# Patient Record
Sex: Male | Born: 1985 | Race: White | Hispanic: No | Marital: Married | Smoking: Never smoker
Health system: Southern US, Community
[De-identification: ages and names within clinical notes are randomized; demographics above are authoritative.]

## PROBLEM LIST (undated history)

## (undated) ENCOUNTER — Emergency Department (HOSPITAL_COMMUNITY): Admission: EM | Payer: 59 | Source: Home / Self Care

## (undated) DIAGNOSIS — Z789 Other specified health status: Secondary | ICD-10-CM

## (undated) DIAGNOSIS — J189 Pneumonia, unspecified organism: Secondary | ICD-10-CM

## (undated) DIAGNOSIS — J45909 Unspecified asthma, uncomplicated: Secondary | ICD-10-CM

## (undated) DIAGNOSIS — M549 Dorsalgia, unspecified: Secondary | ICD-10-CM

## (undated) DIAGNOSIS — Z8719 Personal history of other diseases of the digestive system: Secondary | ICD-10-CM

## (undated) DIAGNOSIS — F909 Attention-deficit hyperactivity disorder, unspecified type: Secondary | ICD-10-CM

## (undated) DIAGNOSIS — M1990 Unspecified osteoarthritis, unspecified site: Secondary | ICD-10-CM

## (undated) DIAGNOSIS — K259 Gastric ulcer, unspecified as acute or chronic, without hemorrhage or perforation: Secondary | ICD-10-CM

## (undated) DIAGNOSIS — K209 Esophagitis, unspecified without bleeding: Secondary | ICD-10-CM

## (undated) DIAGNOSIS — K219 Gastro-esophageal reflux disease without esophagitis: Secondary | ICD-10-CM

## (undated) DIAGNOSIS — K5900 Constipation, unspecified: Secondary | ICD-10-CM

## (undated) DIAGNOSIS — Z9989 Dependence on other enabling machines and devices: Secondary | ICD-10-CM

## (undated) DIAGNOSIS — G8929 Other chronic pain: Secondary | ICD-10-CM

## (undated) DIAGNOSIS — Z9889 Other specified postprocedural states: Secondary | ICD-10-CM

## (undated) DIAGNOSIS — M545 Low back pain, unspecified: Secondary | ICD-10-CM

## (undated) DIAGNOSIS — R52 Pain, unspecified: Secondary | ICD-10-CM

## (undated) DIAGNOSIS — Z860100 Personal history of colon polyps, unspecified: Secondary | ICD-10-CM

## (undated) DIAGNOSIS — K648 Other hemorrhoids: Secondary | ICD-10-CM

## (undated) DIAGNOSIS — K644 Residual hemorrhoidal skin tags: Secondary | ICD-10-CM

## (undated) DIAGNOSIS — Z720 Tobacco use: Secondary | ICD-10-CM

## (undated) DIAGNOSIS — R079 Chest pain, unspecified: Secondary | ICD-10-CM

## (undated) DIAGNOSIS — G4730 Sleep apnea, unspecified: Secondary | ICD-10-CM

## (undated) DIAGNOSIS — F329 Major depressive disorder, single episode, unspecified: Secondary | ICD-10-CM

## (undated) DIAGNOSIS — K449 Diaphragmatic hernia without obstruction or gangrene: Secondary | ICD-10-CM

## (undated) DIAGNOSIS — R946 Abnormal results of thyroid function studies: Secondary | ICD-10-CM

## (undated) DIAGNOSIS — E669 Obesity, unspecified: Secondary | ICD-10-CM

## (undated) DIAGNOSIS — L0291 Cutaneous abscess, unspecified: Secondary | ICD-10-CM

## (undated) DIAGNOSIS — K317 Polyp of stomach and duodenum: Secondary | ICD-10-CM

## (undated) DIAGNOSIS — K602 Anal fissure, unspecified: Secondary | ICD-10-CM

## (undated) DIAGNOSIS — F419 Anxiety disorder, unspecified: Secondary | ICD-10-CM

## (undated) DIAGNOSIS — E039 Hypothyroidism, unspecified: Secondary | ICD-10-CM

## (undated) DIAGNOSIS — K760 Fatty (change of) liver, not elsewhere classified: Secondary | ICD-10-CM

## (undated) DIAGNOSIS — E7800 Pure hypercholesterolemia, unspecified: Secondary | ICD-10-CM

## (undated) DIAGNOSIS — Z8614 Personal history of Methicillin resistant Staphylococcus aureus infection: Secondary | ICD-10-CM

## (undated) DIAGNOSIS — F319 Bipolar disorder, unspecified: Secondary | ICD-10-CM

## (undated) DIAGNOSIS — G4733 Obstructive sleep apnea (adult) (pediatric): Secondary | ICD-10-CM

## (undated) DIAGNOSIS — F32A Depression, unspecified: Secondary | ICD-10-CM

## (undated) DIAGNOSIS — F209 Schizophrenia, unspecified: Secondary | ICD-10-CM

## (undated) DIAGNOSIS — K589 Irritable bowel syndrome without diarrhea: Secondary | ICD-10-CM

## (undated) DIAGNOSIS — Z973 Presence of spectacles and contact lenses: Secondary | ICD-10-CM

## (undated) DIAGNOSIS — Z8711 Personal history of peptic ulcer disease: Secondary | ICD-10-CM

## (undated) HISTORY — DX: Chest pain, unspecified: R07.9

## (undated) HISTORY — DX: Anal fissure, unspecified: K60.2

## (undated) HISTORY — PX: VASECTOMY: SHX75

## (undated) HISTORY — DX: Irritable bowel syndrome, unspecified: K58.9

## (undated) HISTORY — DX: Constipation, unspecified: K59.00

## (undated) HISTORY — DX: Fatty (change of) liver, not elsewhere classified: K76.0

## (undated) HISTORY — DX: Dorsalgia, unspecified: M54.9

## (undated) HISTORY — PX: BACK SURGERY: SHX140

## (undated) HISTORY — PX: GASTRIC BYPASS: SHX52

## (undated) HISTORY — PX: FRACTURE SURGERY: SHX138

## (undated) HISTORY — DX: Other specified postprocedural states: Z98.890

## (undated) HISTORY — DX: Tobacco use: Z72.0

## (undated) HISTORY — PX: ANKLE FRACTURE SURGERY: SHX122

## (undated) HISTORY — DX: Gastro-esophageal reflux disease without esophagitis: K21.9

## (undated) HISTORY — DX: Personal history of Methicillin resistant Staphylococcus aureus infection: Z86.14

## (undated) HISTORY — DX: Sleep apnea, unspecified: G47.30

## (undated) HISTORY — DX: Other specified health status: Z78.9

## (undated) HISTORY — PX: OTHER SURGICAL HISTORY: SHX169

## (undated) HISTORY — DX: Personal history of colon polyps, unspecified: Z86.0100

## (undated) HISTORY — DX: Residual hemorrhoidal skin tags: K64.4

## (undated) HISTORY — DX: Polyp of stomach and duodenum: K31.7

## (undated) HISTORY — DX: Unspecified asthma, uncomplicated: J45.909

## (undated) HISTORY — DX: Personal history of colonic polyps: Z86.010

## (undated) HISTORY — DX: Gastric ulcer, unspecified as acute or chronic, without hemorrhage or perforation: K25.9

## (undated) HISTORY — DX: Other hemorrhoids: K64.8

## (undated) HISTORY — DX: Attention-deficit hyperactivity disorder, unspecified type: F90.9

---
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
## 2006-02-12 ENCOUNTER — Ambulatory Visit: Payer: Self-pay | Admitting: Family Medicine

## 2006-02-21 ENCOUNTER — Ambulatory Visit: Payer: Self-pay | Admitting: Family Medicine

## 2006-03-17 ENCOUNTER — Ambulatory Visit: Payer: Self-pay | Admitting: Family Medicine

## 2006-06-11 ENCOUNTER — Emergency Department (HOSPITAL_COMMUNITY): Admission: EM | Admit: 2006-06-11 | Discharge: 2006-06-11 | Payer: Self-pay | Admitting: Emergency Medicine

## 2006-09-17 ENCOUNTER — Ambulatory Visit: Payer: Self-pay | Admitting: Family Medicine

## 2006-10-17 ENCOUNTER — Ambulatory Visit: Payer: Self-pay | Admitting: Family Medicine

## 2006-11-08 ENCOUNTER — Emergency Department (HOSPITAL_COMMUNITY): Admission: EM | Admit: 2006-11-08 | Discharge: 2006-11-08 | Payer: Self-pay | Admitting: Emergency Medicine

## 2006-11-10 ENCOUNTER — Ambulatory Visit: Payer: Self-pay | Admitting: Family Medicine

## 2006-11-14 ENCOUNTER — Ambulatory Visit: Payer: Self-pay | Admitting: Family Medicine

## 2006-12-05 ENCOUNTER — Ambulatory Visit: Payer: Self-pay | Admitting: Family Medicine

## 2007-01-12 ENCOUNTER — Emergency Department (HOSPITAL_COMMUNITY): Admission: EM | Admit: 2007-01-12 | Discharge: 2007-01-13 | Payer: Self-pay | Admitting: Emergency Medicine

## 2007-01-15 ENCOUNTER — Ambulatory Visit: Payer: Self-pay | Admitting: Family Medicine

## 2007-02-17 ENCOUNTER — Ambulatory Visit: Payer: Self-pay | Admitting: Family Medicine

## 2007-06-26 DIAGNOSIS — Z8614 Personal history of Methicillin resistant Staphylococcus aureus infection: Secondary | ICD-10-CM

## 2007-06-26 HISTORY — DX: Personal history of Methicillin resistant Staphylococcus aureus infection: Z86.14

## 2007-07-19 ENCOUNTER — Emergency Department (HOSPITAL_COMMUNITY): Admission: EM | Admit: 2007-07-19 | Discharge: 2007-07-19 | Payer: Self-pay | Admitting: Emergency Medicine

## 2007-08-12 ENCOUNTER — Emergency Department (HOSPITAL_COMMUNITY): Admission: EM | Admit: 2007-08-12 | Discharge: 2007-08-12 | Payer: Self-pay | Admitting: Emergency Medicine

## 2007-08-13 ENCOUNTER — Ambulatory Visit: Payer: Self-pay | Admitting: Urgent Care

## 2007-08-24 ENCOUNTER — Emergency Department (HOSPITAL_COMMUNITY): Admission: EM | Admit: 2007-08-24 | Discharge: 2007-08-25 | Payer: Self-pay | Admitting: Emergency Medicine

## 2007-08-26 ENCOUNTER — Ambulatory Visit (HOSPITAL_COMMUNITY): Admission: RE | Admit: 2007-08-26 | Discharge: 2007-08-26 | Payer: Self-pay | Admitting: Internal Medicine

## 2007-08-26 ENCOUNTER — Ambulatory Visit: Payer: Self-pay | Admitting: Internal Medicine

## 2007-08-26 HISTORY — PX: ESOPHAGOGASTRODUODENOSCOPY: SHX1529

## 2007-08-26 HISTORY — PX: OTHER SURGICAL HISTORY: SHX169

## 2007-09-01 ENCOUNTER — Emergency Department (HOSPITAL_COMMUNITY): Admission: EM | Admit: 2007-09-01 | Discharge: 2007-09-01 | Payer: Self-pay | Admitting: Family Medicine

## 2007-10-05 ENCOUNTER — Emergency Department (HOSPITAL_COMMUNITY): Admission: EM | Admit: 2007-10-05 | Discharge: 2007-10-05 | Payer: Self-pay | Admitting: Family Medicine

## 2007-10-10 ENCOUNTER — Emergency Department (HOSPITAL_COMMUNITY): Admission: EM | Admit: 2007-10-10 | Discharge: 2007-10-10 | Payer: Self-pay | Admitting: Family Medicine

## 2007-11-30 ENCOUNTER — Emergency Department (HOSPITAL_COMMUNITY): Admission: EM | Admit: 2007-11-30 | Discharge: 2007-11-30 | Payer: Self-pay | Admitting: Family Medicine

## 2008-01-27 ENCOUNTER — Emergency Department (HOSPITAL_COMMUNITY): Admission: EM | Admit: 2008-01-27 | Discharge: 2008-01-27 | Payer: Self-pay | Admitting: Emergency Medicine

## 2009-01-05 ENCOUNTER — Emergency Department (HOSPITAL_COMMUNITY): Admission: EM | Admit: 2009-01-05 | Discharge: 2009-01-06 | Payer: Self-pay | Admitting: Emergency Medicine

## 2009-01-06 ENCOUNTER — Emergency Department (HOSPITAL_COMMUNITY): Admission: EM | Admit: 2009-01-06 | Discharge: 2009-01-06 | Payer: Self-pay | Admitting: Emergency Medicine

## 2009-01-09 ENCOUNTER — Ambulatory Visit (HOSPITAL_COMMUNITY): Admission: RE | Admit: 2009-01-09 | Discharge: 2009-01-09 | Payer: Self-pay | Admitting: Internal Medicine

## 2009-01-09 ENCOUNTER — Ambulatory Visit: Payer: Self-pay | Admitting: Internal Medicine

## 2009-01-09 HISTORY — PX: ESOPHAGOGASTRODUODENOSCOPY: SHX1529

## 2009-01-30 ENCOUNTER — Ambulatory Visit: Payer: Self-pay | Admitting: Internal Medicine

## 2009-01-31 ENCOUNTER — Telehealth (INDEPENDENT_AMBULATORY_CARE_PROVIDER_SITE_OTHER): Payer: Self-pay

## 2009-02-23 ENCOUNTER — Emergency Department (HOSPITAL_COMMUNITY): Admission: EM | Admit: 2009-02-23 | Discharge: 2009-02-23 | Payer: Self-pay | Admitting: Emergency Medicine

## 2009-02-27 ENCOUNTER — Emergency Department (HOSPITAL_COMMUNITY): Admission: EM | Admit: 2009-02-27 | Discharge: 2009-02-27 | Payer: Self-pay | Admitting: Emergency Medicine

## 2009-02-28 ENCOUNTER — Emergency Department (HOSPITAL_COMMUNITY): Admission: EM | Admit: 2009-02-28 | Discharge: 2009-02-28 | Payer: Self-pay | Admitting: Emergency Medicine

## 2009-03-05 ENCOUNTER — Emergency Department (HOSPITAL_COMMUNITY): Admission: EM | Admit: 2009-03-05 | Discharge: 2009-03-05 | Payer: Self-pay | Admitting: Emergency Medicine

## 2009-03-06 DIAGNOSIS — K219 Gastro-esophageal reflux disease without esophagitis: Secondary | ICD-10-CM | POA: Insufficient documentation

## 2009-03-06 DIAGNOSIS — F909 Attention-deficit hyperactivity disorder, unspecified type: Secondary | ICD-10-CM | POA: Insufficient documentation

## 2009-03-06 DIAGNOSIS — F17200 Nicotine dependence, unspecified, uncomplicated: Secondary | ICD-10-CM | POA: Insufficient documentation

## 2009-03-06 DIAGNOSIS — R197 Diarrhea, unspecified: Secondary | ICD-10-CM | POA: Insufficient documentation

## 2009-03-06 DIAGNOSIS — F319 Bipolar disorder, unspecified: Secondary | ICD-10-CM | POA: Insufficient documentation

## 2009-03-06 DIAGNOSIS — R112 Nausea with vomiting, unspecified: Secondary | ICD-10-CM | POA: Insufficient documentation

## 2009-03-07 ENCOUNTER — Ambulatory Visit: Payer: Self-pay | Admitting: Internal Medicine

## 2009-03-07 ENCOUNTER — Emergency Department (HOSPITAL_COMMUNITY): Admission: EM | Admit: 2009-03-07 | Discharge: 2009-03-08 | Payer: Self-pay | Admitting: Emergency Medicine

## 2009-03-07 DIAGNOSIS — R1030 Lower abdominal pain, unspecified: Secondary | ICD-10-CM

## 2009-03-07 DIAGNOSIS — R109 Unspecified abdominal pain: Secondary | ICD-10-CM

## 2009-03-07 HISTORY — DX: Lower abdominal pain, unspecified: R10.30

## 2009-03-08 ENCOUNTER — Encounter: Payer: Self-pay | Admitting: Gastroenterology

## 2009-03-08 LAB — CONVERTED CEMR LAB: IgA: 112 mg/dL (ref 68–378)

## 2009-04-04 ENCOUNTER — Emergency Department (HOSPITAL_COMMUNITY): Admission: EM | Admit: 2009-04-04 | Discharge: 2009-04-04 | Payer: Self-pay | Admitting: Emergency Medicine

## 2009-04-05 ENCOUNTER — Encounter (INDEPENDENT_AMBULATORY_CARE_PROVIDER_SITE_OTHER): Payer: Self-pay | Admitting: *Deleted

## 2009-04-06 ENCOUNTER — Ambulatory Visit: Payer: Self-pay | Admitting: Internal Medicine

## 2009-04-06 DIAGNOSIS — R195 Other fecal abnormalities: Secondary | ICD-10-CM | POA: Insufficient documentation

## 2009-04-06 DIAGNOSIS — K921 Melena: Secondary | ICD-10-CM

## 2009-04-06 HISTORY — DX: Melena: K92.1

## 2009-04-07 ENCOUNTER — Ambulatory Visit (HOSPITAL_COMMUNITY): Admission: RE | Admit: 2009-04-07 | Discharge: 2009-04-07 | Payer: Self-pay | Admitting: Internal Medicine

## 2009-04-07 ENCOUNTER — Ambulatory Visit: Payer: Self-pay | Admitting: Internal Medicine

## 2009-04-07 HISTORY — PX: ESOPHAGOGASTRODUODENOSCOPY: SHX1529

## 2009-04-11 ENCOUNTER — Ambulatory Visit (HOSPITAL_COMMUNITY): Admission: RE | Admit: 2009-04-11 | Discharge: 2009-04-11 | Payer: Self-pay | Admitting: Internal Medicine

## 2009-04-11 HISTORY — PX: OTHER SURGICAL HISTORY: SHX169

## 2009-04-12 ENCOUNTER — Ambulatory Visit: Payer: Self-pay | Admitting: Internal Medicine

## 2009-04-19 ENCOUNTER — Encounter: Payer: Self-pay | Admitting: Internal Medicine

## 2009-04-27 ENCOUNTER — Encounter: Payer: Self-pay | Admitting: Gastroenterology

## 2009-05-22 ENCOUNTER — Encounter (INDEPENDENT_AMBULATORY_CARE_PROVIDER_SITE_OTHER): Payer: Self-pay

## 2009-05-23 ENCOUNTER — Telehealth (INDEPENDENT_AMBULATORY_CARE_PROVIDER_SITE_OTHER): Payer: Self-pay | Admitting: *Deleted

## 2009-05-30 ENCOUNTER — Encounter: Payer: Self-pay | Admitting: Internal Medicine

## 2009-06-08 ENCOUNTER — Encounter: Payer: Self-pay | Admitting: Internal Medicine

## 2009-06-09 ENCOUNTER — Encounter: Payer: Self-pay | Admitting: Internal Medicine

## 2009-06-09 ENCOUNTER — Telehealth (INDEPENDENT_AMBULATORY_CARE_PROVIDER_SITE_OTHER): Payer: Self-pay

## 2009-07-10 ENCOUNTER — Emergency Department (HOSPITAL_COMMUNITY): Admission: EM | Admit: 2009-07-10 | Discharge: 2009-07-10 | Payer: Self-pay | Admitting: Emergency Medicine

## 2009-09-07 ENCOUNTER — Telehealth (INDEPENDENT_AMBULATORY_CARE_PROVIDER_SITE_OTHER): Payer: Self-pay

## 2009-09-11 ENCOUNTER — Encounter: Payer: Self-pay | Admitting: Internal Medicine

## 2009-09-11 DIAGNOSIS — D649 Anemia, unspecified: Secondary | ICD-10-CM | POA: Insufficient documentation

## 2009-09-13 ENCOUNTER — Encounter: Payer: Self-pay | Admitting: Internal Medicine

## 2009-09-15 ENCOUNTER — Ambulatory Visit: Payer: Self-pay | Admitting: Gastroenterology

## 2009-09-19 ENCOUNTER — Emergency Department (HOSPITAL_COMMUNITY): Admission: EM | Admit: 2009-09-19 | Discharge: 2009-09-19 | Payer: Self-pay | Admitting: Emergency Medicine

## 2009-09-20 LAB — CONVERTED CEMR LAB
Hemoglobin: 13.6 g/dL (ref 13.0–17.0)
Lymphs Abs: 2.9 10*3/uL (ref 0.7–4.0)
Monocytes Absolute: 0.5 10*3/uL (ref 0.1–1.0)
Monocytes Relative: 8 % (ref 3–12)
Neutro Abs: 3.4 10*3/uL (ref 1.7–7.7)
Neutrophils Relative %: 48 % (ref 43–77)
RBC: 5.42 M/uL (ref 4.22–5.81)
WBC: 7.1 10*3/uL (ref 4.0–10.5)

## 2009-10-03 ENCOUNTER — Ambulatory Visit: Payer: Self-pay | Admitting: Internal Medicine

## 2009-10-04 ENCOUNTER — Telehealth (INDEPENDENT_AMBULATORY_CARE_PROVIDER_SITE_OTHER): Payer: Self-pay

## 2009-10-05 ENCOUNTER — Encounter: Payer: Self-pay | Admitting: Internal Medicine

## 2009-10-10 ENCOUNTER — Ambulatory Visit (HOSPITAL_COMMUNITY): Admission: RE | Admit: 2009-10-10 | Discharge: 2009-10-10 | Payer: Self-pay | Admitting: Internal Medicine

## 2009-10-10 ENCOUNTER — Ambulatory Visit: Payer: Self-pay | Admitting: Internal Medicine

## 2009-10-10 HISTORY — PX: COLONOSCOPY: SHX174

## 2009-11-03 ENCOUNTER — Telehealth (INDEPENDENT_AMBULATORY_CARE_PROVIDER_SITE_OTHER): Payer: Self-pay

## 2009-11-17 ENCOUNTER — Emergency Department (HOSPITAL_COMMUNITY): Admission: EM | Admit: 2009-11-17 | Discharge: 2009-11-17 | Payer: Self-pay | Admitting: Emergency Medicine

## 2009-12-09 ENCOUNTER — Emergency Department (HOSPITAL_COMMUNITY): Admission: EM | Admit: 2009-12-09 | Discharge: 2009-12-09 | Payer: Self-pay | Admitting: Emergency Medicine

## 2010-01-01 ENCOUNTER — Encounter: Admission: RE | Admit: 2010-01-01 | Discharge: 2010-04-01 | Payer: Self-pay | Admitting: Orthopedic Surgery

## 2010-02-22 ENCOUNTER — Emergency Department (HOSPITAL_COMMUNITY): Admission: EM | Admit: 2010-02-22 | Discharge: 2010-02-22 | Payer: Self-pay | Admitting: Emergency Medicine

## 2010-02-24 ENCOUNTER — Ambulatory Visit: Payer: Self-pay | Admitting: Diagnostic Radiology

## 2010-02-24 ENCOUNTER — Emergency Department (HOSPITAL_BASED_OUTPATIENT_CLINIC_OR_DEPARTMENT_OTHER): Admission: EM | Admit: 2010-02-24 | Discharge: 2010-02-24 | Payer: Self-pay | Admitting: Emergency Medicine

## 2010-02-26 ENCOUNTER — Telehealth (INDEPENDENT_AMBULATORY_CARE_PROVIDER_SITE_OTHER): Payer: Self-pay

## 2010-03-01 ENCOUNTER — Encounter: Payer: Self-pay | Admitting: Emergency Medicine

## 2010-03-01 ENCOUNTER — Ambulatory Visit: Payer: Self-pay | Admitting: Diagnostic Radiology

## 2010-03-01 ENCOUNTER — Inpatient Hospital Stay (HOSPITAL_COMMUNITY): Admission: EM | Admit: 2010-03-01 | Discharge: 2010-03-04 | Payer: Self-pay | Admitting: Internal Medicine

## 2010-05-07 ENCOUNTER — Telehealth (INDEPENDENT_AMBULATORY_CARE_PROVIDER_SITE_OTHER): Payer: Self-pay

## 2010-05-24 ENCOUNTER — Emergency Department (HOSPITAL_COMMUNITY): Admission: EM | Admit: 2010-05-24 | Discharge: 2010-05-25 | Payer: Self-pay | Admitting: Emergency Medicine

## 2010-06-29 ENCOUNTER — Emergency Department (HOSPITAL_COMMUNITY): Admission: EM | Admit: 2010-06-29 | Discharge: 2010-06-29 | Payer: Self-pay | Admitting: Emergency Medicine

## 2010-07-15 ENCOUNTER — Emergency Department (HOSPITAL_COMMUNITY): Admission: EM | Admit: 2010-07-15 | Discharge: 2010-07-16 | Payer: Self-pay | Admitting: Emergency Medicine

## 2010-11-08 ENCOUNTER — Emergency Department (HOSPITAL_BASED_OUTPATIENT_CLINIC_OR_DEPARTMENT_OTHER)
Admission: EM | Admit: 2010-11-08 | Discharge: 2010-11-08 | Payer: Self-pay | Source: Home / Self Care | Admitting: Emergency Medicine

## 2010-12-25 NOTE — Progress Notes (Signed)
Summary: phone message/ requests samples of Nexium  Phone Note Call from Patient   Caller: Patient Summary of Call: Pt called and said his Nexium would cost him $265.00. He can't afford. He lost Medicaid. Requests samples of Nexium. #20 at front for pick-up and pt is aware.  Initial call taken by: Cloria Spring LPN,  May 07, 2010 2:27 PM

## 2010-12-25 NOTE — Progress Notes (Signed)
Summary: blood in stool  Phone Note Call from Patient   Caller: Patient Summary of Call: Pt called and said he had two BM's yesterday and two BM's already this AM with dark red blood in the stool, and  bright red blood on tissue. Please advise! Initial call taken by: Cloria Spring LPN,  February 26, 2010 11:01 AM     Appended Document: blood in stool recommend appt with rmr in urgent spot for this week  Appended Document: blood in stool Pt informed.

## 2011-01-21 ENCOUNTER — Emergency Department (INDEPENDENT_AMBULATORY_CARE_PROVIDER_SITE_OTHER): Payer: Medicaid Other

## 2011-01-21 ENCOUNTER — Emergency Department (HOSPITAL_BASED_OUTPATIENT_CLINIC_OR_DEPARTMENT_OTHER)
Admission: EM | Admit: 2011-01-21 | Discharge: 2011-01-21 | Disposition: A | Discharge status: Home / Self Care | Payer: Medicaid Other | Attending: Emergency Medicine | Admitting: Emergency Medicine

## 2011-01-21 DIAGNOSIS — N509 Disorder of male genital organs, unspecified: Secondary | ICD-10-CM

## 2011-01-21 DIAGNOSIS — F319 Bipolar disorder, unspecified: Secondary | ICD-10-CM | POA: Insufficient documentation

## 2011-01-21 DIAGNOSIS — N433 Hydrocele, unspecified: Secondary | ICD-10-CM

## 2011-01-21 DIAGNOSIS — K219 Gastro-esophageal reflux disease without esophagitis: Secondary | ICD-10-CM | POA: Insufficient documentation

## 2011-01-21 DIAGNOSIS — Z79899 Other long term (current) drug therapy: Secondary | ICD-10-CM | POA: Insufficient documentation

## 2011-01-21 LAB — URINALYSIS, ROUTINE W REFLEX MICROSCOPIC
Ketones, ur: NEGATIVE mg/dL
Nitrite: NEGATIVE
Specific Gravity, Urine: 1.022 (ref 1.005–1.030)
pH: 7.5 (ref 5.0–8.0)

## 2011-02-10 LAB — URINALYSIS, ROUTINE W REFLEX MICROSCOPIC
Bilirubin Urine: NEGATIVE
Nitrite: NEGATIVE
Specific Gravity, Urine: 1.025 (ref 1.005–1.030)
Urobilinogen, UA: 0.2 mg/dL (ref 0.0–1.0)

## 2011-02-10 LAB — CBC
Hemoglobin: 12.7 g/dL — ABNORMAL LOW (ref 13.0–17.0)
MCH: 25.6 pg — ABNORMAL LOW (ref 26.0–34.0)
RBC: 4.96 MIL/uL (ref 4.22–5.81)

## 2011-02-10 LAB — RAPID URINE DRUG SCREEN, HOSP PERFORMED
Opiates: NOT DETECTED
Tetrahydrocannabinol: NOT DETECTED

## 2011-02-10 LAB — BASIC METABOLIC PANEL
CO2: 25 mEq/L (ref 19–32)
Calcium: 9.3 mg/dL (ref 8.4–10.5)
GFR calc Af Amer: >60 mL/min (ref >60)
GFR calc non Af Amer: >60 mL/min (ref >60)
Sodium: 138 mEq/L (ref 135–145)

## 2011-02-10 LAB — DIFFERENTIAL
Lymphs Abs: 3.8 10*3/uL (ref 0.7–4.0)
Monocytes Relative: 6 % (ref 3–12)
Neutro Abs: 6 10*3/uL (ref 1.7–7.7)
Neutrophils Relative %: 56 % (ref 43–77)

## 2011-02-13 LAB — URINALYSIS, ROUTINE W REFLEX MICROSCOPIC
Bilirubin Urine: NEGATIVE
Glucose, UA: NEGATIVE mg/dL
Hgb urine dipstick: NEGATIVE
Hgb urine dipstick: NEGATIVE
Nitrite: NEGATIVE
Nitrite: NEGATIVE
Protein, ur: NEGATIVE mg/dL
Protein, ur: NEGATIVE mg/dL
Specific Gravity, Urine: 1.016 (ref 1.005–1.030)
Specific Gravity, Urine: 1.027 (ref 1.005–1.030)
Urobilinogen, UA: 0.2 mg/dL (ref 0.0–1.0)
Urobilinogen, UA: 0.2 mg/dL (ref 0.0–1.0)
pH: 5 (ref 5.0–8.0)

## 2011-02-13 LAB — CBC
HCT: 35.7 % — ABNORMAL LOW (ref 39.0–52.0)
HCT: 36.2 % — ABNORMAL LOW (ref 39.0–52.0)
HCT: 37.2 % — ABNORMAL LOW (ref 39.0–52.0)
HCT: 39.5 % (ref 39.0–52.0)
Hemoglobin: 11.3 g/dL — ABNORMAL LOW (ref 13.0–17.0)
Hemoglobin: 11.6 g/dL — ABNORMAL LOW (ref 13.0–17.0)
Hemoglobin: 11.9 g/dL — ABNORMAL LOW (ref 13.0–17.0)
Hemoglobin: 13 g/dL (ref 13.0–17.0)
MCHC: 32.9 g/dL (ref 30.0–36.0)
MCHC: 33.2 g/dL (ref 30.0–36.0)
MCHC: 33.4 g/dL (ref 30.0–36.0)
MCV: 79.2 fL (ref 78.0–100.0)
MCV: 80.1 fL (ref 78.0–100.0)
Platelets: 256 10*3/uL (ref 150–400)
Platelets: 263 10*3/uL (ref 150–400)
Platelets: 271 10*3/uL (ref 150–400)
Platelets: 326 10*3/uL (ref 150–400)
RBC: 4.26 MIL/uL (ref 4.22–5.81)
RBC: 4.41 MIL/uL (ref 4.22–5.81)
RBC: 4.53 MIL/uL (ref 4.22–5.81)
RBC: 4.99 MIL/uL (ref 4.22–5.81)
RDW: 15.7 % — ABNORMAL HIGH (ref 11.5–15.5)
RDW: 16 % — ABNORMAL HIGH (ref 11.5–15.5)
RDW: 16 % — ABNORMAL HIGH (ref 11.5–15.5)
RDW: 16.3 % — ABNORMAL HIGH (ref 11.5–15.5)
WBC: 10.4 10*3/uL (ref 4.0–10.5)
WBC: 8.8 10*3/uL (ref 4.0–10.5)
WBC: 8.9 10*3/uL (ref 4.0–10.5)
WBC: 9 10*3/uL (ref 4.0–10.5)

## 2011-02-13 LAB — BASIC METABOLIC PANEL
CO2: 32 mEq/L (ref 19–32)
Calcium: 9 mg/dL (ref 8.4–10.5)
Creatinine, Ser: 0.9 mg/dL (ref 0.4–1.5)
GFR calc non Af Amer: >60 mL/min (ref >60)
Glucose, Bld: 102 mg/dL — ABNORMAL HIGH (ref 70–99)
Sodium: 142 mEq/L (ref 135–145)

## 2011-02-13 LAB — COMPREHENSIVE METABOLIC PANEL
AST: 33 U/L (ref 0–37)
Albumin: 4.1 g/dL (ref 3.5–5.2)
Alkaline Phosphatase: 59 U/L (ref 39–117)
Alkaline Phosphatase: 64 U/L (ref 39–117)
BUN: 11 mg/dL (ref 6–23)
BUN: 8 mg/dL (ref 6–23)
CO2: 26 mEq/L (ref 19–32)
CO2: 30 mEq/L (ref 19–32)
Chloride: 105 mEq/L (ref 96–112)
Chloride: 105 mEq/L (ref 96–112)
Creatinine, Ser: 0.98 mg/dL (ref 0.4–1.5)
Creatinine, Ser: 1 mg/dL (ref 0.4–1.5)
GFR calc Af Amer: >60 mL/min (ref >60)
GFR calc non Af Amer: >60 mL/min (ref >60)
GFR calc non Af Amer: >60 mL/min (ref >60)
Potassium: 3.9 mEq/L (ref 3.5–5.1)
Potassium: 4.3 mEq/L (ref 3.5–5.1)
Total Bilirubin: 0.4 mg/dL (ref 0.3–1.2)
Total Bilirubin: 0.7 mg/dL (ref 0.3–1.2)

## 2011-02-13 LAB — DIFFERENTIAL
Basophils Absolute: 0.1 10*3/uL (ref 0.0–0.1)
Basophils Absolute: 0.2 10*3/uL — ABNORMAL HIGH (ref 0.0–0.1)
Basophils Relative: 1 % (ref 0–1)
Basophils Relative: 2 % — ABNORMAL HIGH (ref 0–1)
Eosinophils Relative: 1 % (ref 0–5)
Eosinophils Relative: 3 % (ref 0–5)
Lymphocytes Relative: 43 % (ref 12–46)
Monocytes Absolute: 0.8 10*3/uL (ref 0.1–1.0)
Neutro Abs: 5.6 10*3/uL (ref 1.7–7.7)

## 2011-02-13 LAB — HEMOCCULT GUIAC POC 1CARD (OFFICE)
Fecal Occult Bld: NEGATIVE
Fecal Occult Bld: POSITIVE

## 2011-02-13 LAB — T3, FREE: T3, Free: 2.8 pg/mL (ref 2.3–4.2)

## 2011-02-13 LAB — URINE CULTURE
Culture: NO GROWTH
Special Requests: NEGATIVE

## 2011-02-13 LAB — CULTURE, BLOOD (ROUTINE X 2): Culture: NO GROWTH

## 2011-02-13 LAB — GC/CHLAMYDIA PROBE AMP, URINE
Chlamydia, Swab/Urine, PCR: NEGATIVE
GC Probe Amp, Urine: NEGATIVE

## 2011-02-13 LAB — T4, FREE: Free T4: 0.78 ng/dL — ABNORMAL LOW (ref 0.80–1.80)

## 2011-02-13 LAB — MAGNESIUM: Magnesium: 2.1 mg/dL (ref 1.5–2.5)

## 2011-02-13 LAB — LACTIC ACID, PLASMA: Lactic Acid, Venous: 1.4 mmol/L (ref 0.5–2.2)

## 2011-02-13 LAB — TSH: TSH: 8.799 u[IU]/mL — ABNORMAL HIGH (ref 0.350–4.500)

## 2011-02-13 LAB — LIPASE, BLOOD: Lipase: 91 U/L (ref 23–300)

## 2011-02-13 LAB — PROTIME-INR: INR: 1.02 (ref 0.00–1.49)

## 2011-02-13 LAB — APTT: aPTT: 27 seconds (ref 24–37)

## 2011-02-13 LAB — SEDIMENTATION RATE: Sed Rate: 1 mm/hr (ref 0–16)

## 2011-02-13 LAB — RPR: RPR Ser Ql: NONREACTIVE

## 2011-02-15 ENCOUNTER — Emergency Department (HOSPITAL_BASED_OUTPATIENT_CLINIC_OR_DEPARTMENT_OTHER)
Admission: EM | Admit: 2011-02-15 | Discharge: 2011-02-15 | Disposition: A | Discharge status: Home / Self Care | Payer: Medicaid Other | Attending: Emergency Medicine | Admitting: Emergency Medicine

## 2011-02-15 DIAGNOSIS — R112 Nausea with vomiting, unspecified: Secondary | ICD-10-CM | POA: Insufficient documentation

## 2011-02-15 DIAGNOSIS — K922 Gastrointestinal hemorrhage, unspecified: Secondary | ICD-10-CM | POA: Insufficient documentation

## 2011-02-15 DIAGNOSIS — F319 Bipolar disorder, unspecified: Secondary | ICD-10-CM | POA: Insufficient documentation

## 2011-02-15 DIAGNOSIS — R197 Diarrhea, unspecified: Secondary | ICD-10-CM | POA: Insufficient documentation

## 2011-02-15 DIAGNOSIS — F988 Other specified behavioral and emotional disorders with onset usually occurring in childhood and adolescence: Secondary | ICD-10-CM | POA: Insufficient documentation

## 2011-02-15 DIAGNOSIS — K219 Gastro-esophageal reflux disease without esophagitis: Secondary | ICD-10-CM | POA: Insufficient documentation

## 2011-02-15 LAB — CBC
HCT: 43.4 % (ref 39.0–52.0)
MCHC: 32.9 g/dL (ref 30.0–36.0)
Platelets: 316 10*3/uL (ref 150–400)
RDW: 16.9 % — ABNORMAL HIGH (ref 11.5–15.5)
WBC: 7.5 10*3/uL (ref 4.0–10.5)

## 2011-02-15 LAB — DIFFERENTIAL
Basophils Absolute: 0 10*3/uL (ref 0.0–0.1)
Basophils Relative: 0 % (ref 0–1)
Eosinophils Absolute: 0.1 10*3/uL (ref 0.0–0.7)
Eosinophils Relative: 1 % (ref 0–5)
Lymphocytes Relative: 31 % (ref 12–46)
Monocytes Absolute: 0.6 10*3/uL (ref 0.1–1.0)

## 2011-02-15 LAB — HEMOCCULT GUIAC POC 1CARD (OFFICE): Fecal Occult Bld: NEGATIVE

## 2011-02-15 LAB — URINALYSIS, ROUTINE W REFLEX MICROSCOPIC
Hgb urine dipstick: NEGATIVE
Nitrite: NEGATIVE
Protein, ur: 100 mg/dL — AB
Urobilinogen, UA: 1 mg/dL (ref 0.0–1.0)

## 2011-02-15 LAB — COMPREHENSIVE METABOLIC PANEL
ALT: 50 U/L (ref 0–53)
Albumin: 4.6 g/dL (ref 3.5–5.2)
Alkaline Phosphatase: 64 U/L (ref 39–117)
Chloride: 104 mEq/L (ref 96–112)
Potassium: 4.2 mEq/L (ref 3.5–5.1)
Sodium: 144 mEq/L (ref 135–145)
Total Protein: 7.8 g/dL (ref 6.0–8.3)

## 2011-02-15 LAB — URINE MICROSCOPIC-ADD ON

## 2011-03-05 LAB — CBC
MCHC: 34 g/dL (ref 30.0–36.0)
MCV: 79.4 fL (ref 78.0–100.0)
Platelets: 248 10*3/uL (ref 150–400)
Platelets: 283 10*3/uL (ref 150–400)
RDW: 14.9 % (ref 11.5–15.5)
WBC: 7.7 10*3/uL (ref 4.0–10.5)

## 2011-03-05 LAB — BASIC METABOLIC PANEL
BUN: 11 mg/dL (ref 6–23)
CO2: 29 mEq/L (ref 19–32)
Calcium: 9.2 mg/dL (ref 8.4–10.5)
Creatinine, Ser: 0.93 mg/dL (ref 0.4–1.5)
Glucose, Bld: 108 mg/dL — ABNORMAL HIGH (ref 70–99)

## 2011-03-05 LAB — DIFFERENTIAL
Basophils Absolute: 0 10*3/uL (ref 0.0–0.1)
Basophils Relative: 1 % (ref 0–1)
Monocytes Absolute: 0.5 10*3/uL (ref 0.1–1.0)
Neutro Abs: 5.1 10*3/uL (ref 1.7–7.7)
Neutrophils Relative %: 56 % (ref 43–77)

## 2011-03-05 LAB — SAMPLE TO BLOOD BANK

## 2011-03-06 LAB — DIFFERENTIAL
Basophils Absolute: 0.1 10*3/uL (ref 0.0–0.1)
Basophils Relative: 1 % (ref 0–1)
Basophils Relative: 1 % (ref 0–1)
Eosinophils Relative: 2 % (ref 0–5)
Lymphs Abs: 2.6 10*3/uL (ref 0.7–4.0)
Monocytes Absolute: 0.7 10*3/uL (ref 0.1–1.0)
Monocytes Absolute: 0.7 10*3/uL (ref 0.1–1.0)
Monocytes Relative: 8 % (ref 3–12)
Neutro Abs: 4.7 10*3/uL (ref 1.7–7.7)

## 2011-03-06 LAB — BASIC METABOLIC PANEL
CO2: 28 mEq/L (ref 19–32)
Calcium: 9.5 mg/dL (ref 8.4–10.5)
Chloride: 105 mEq/L (ref 96–112)
Glucose, Bld: 88 mg/dL (ref 70–99)
Potassium: 4.1 mEq/L (ref 3.5–5.1)
Sodium: 139 mEq/L (ref 135–145)

## 2011-03-06 LAB — CBC
HCT: 40.8 % (ref 39.0–52.0)
Hemoglobin: 13.8 g/dL (ref 13.0–17.0)
MCHC: 33.5 g/dL (ref 30.0–36.0)
MCHC: 33.8 g/dL (ref 30.0–36.0)
MCV: 80.3 fL (ref 78.0–100.0)
Platelets: 239 10*3/uL (ref 150–400)
RDW: 14.8 % (ref 11.5–15.5)
RDW: 15 % (ref 11.5–15.5)

## 2011-03-06 LAB — COMPREHENSIVE METABOLIC PANEL
ALT: 25 U/L (ref 0–53)
Albumin: 3.7 g/dL (ref 3.5–5.2)
Alkaline Phosphatase: 61 U/L (ref 39–117)
BUN: 8 mg/dL (ref 6–23)
Calcium: 9.3 mg/dL (ref 8.4–10.5)
Potassium: 4.2 mEq/L (ref 3.5–5.1)
Sodium: 139 mEq/L (ref 135–145)
Total Protein: 6.3 g/dL (ref 6.0–8.3)

## 2011-03-06 LAB — URINALYSIS, ROUTINE W REFLEX MICROSCOPIC
Bilirubin Urine: NEGATIVE
Hgb urine dipstick: NEGATIVE
Specific Gravity, Urine: >1.03 — ABNORMAL HIGH (ref 1.005–1.030)
Urobilinogen, UA: 0.2 mg/dL (ref 0.0–1.0)

## 2011-03-06 LAB — STREP A DNA PROBE: Group A Strep Probe: NEGATIVE

## 2011-03-06 LAB — RAPID STREP SCREEN (MED CTR MEBANE ONLY): Streptococcus, Group A Screen (Direct): NEGATIVE

## 2011-03-12 LAB — POCT I-STAT, CHEM 8
Calcium, Ion: 1.19 mmol/L (ref 1.12–1.32)
Chloride: 102 mEq/L (ref 96–112)
Glucose, Bld: 93 mg/dL (ref 70–99)
HCT: 48 % (ref 39.0–52.0)
Hemoglobin: 16.3 g/dL (ref 13.0–17.0)
TCO2: 28 mmol/L (ref 0–100)

## 2011-03-12 LAB — DIFFERENTIAL
Basophils Absolute: 0 10*3/uL (ref 0.0–0.1)
Basophils Relative: 0 % (ref 0–1)
Basophils Relative: 0 % (ref 0–1)
Eosinophils Absolute: 0.1 10*3/uL (ref 0.0–0.7)
Eosinophils Absolute: 0.1 10*3/uL (ref 0.0–0.7)
Eosinophils Relative: 1 % (ref 0–5)
Eosinophils Relative: 2 % (ref 0–5)
Monocytes Absolute: 0.7 10*3/uL (ref 0.1–1.0)
Monocytes Relative: 6 % (ref 3–12)
Monocytes Relative: 7 % (ref 3–12)
Neutro Abs: 6 10*3/uL (ref 1.7–7.7)
Neutrophils Relative %: 55 % (ref 43–77)

## 2011-03-12 LAB — CBC
Hemoglobin: 14.7 g/dL (ref 13.0–17.0)
Hemoglobin: 13.8 g/dL (ref 13.0–17.0)
MCHC: 34.2 g/dL (ref 30.0–36.0)
MCHC: 33.1 g/dL (ref 30.0–36.0)
MCV: 80.8 fL (ref 78.0–100.0)
Platelets: 267 10*3/uL (ref 150–400)
RBC: 5.31 MIL/uL (ref 4.22–5.81)
RDW: 14.4 % (ref 11.5–15.5)
RDW: 14.6 % (ref 11.5–15.5)

## 2011-03-12 LAB — COMPREHENSIVE METABOLIC PANEL
ALT: 25 U/L (ref 0–53)
Albumin: 3.9 g/dL (ref 3.5–5.2)
Alkaline Phosphatase: 66 U/L (ref 39–117)
BUN: 10 mg/dL (ref 6–23)
Calcium: 9.2 mg/dL (ref 8.4–10.5)
Glucose, Bld: 95 mg/dL (ref 70–99)
Potassium: 3.8 mEq/L (ref 3.5–5.1)
Sodium: 140 mEq/L (ref 135–145)
Total Protein: 6.3 g/dL (ref 6.0–8.3)

## 2011-03-12 LAB — URINALYSIS, ROUTINE W REFLEX MICROSCOPIC
Bilirubin Urine: NEGATIVE
Ketones, ur: NEGATIVE mg/dL
Nitrite: NEGATIVE
Protein, ur: NEGATIVE mg/dL

## 2011-03-12 LAB — POCT I-STAT 4, (NA,K, GLUC, HGB,HCT): Potassium: 4.3 mEq/L (ref 3.5–5.1)

## 2011-04-02 ENCOUNTER — Emergency Department (HOSPITAL_BASED_OUTPATIENT_CLINIC_OR_DEPARTMENT_OTHER)
Admission: EM | Admit: 2011-04-02 | Discharge: 2011-04-02 | Disposition: A | Discharge status: Home / Self Care | Payer: Medicaid Other | Attending: Emergency Medicine | Admitting: Emergency Medicine

## 2011-04-02 DIAGNOSIS — K089 Disorder of teeth and supporting structures, unspecified: Secondary | ICD-10-CM | POA: Insufficient documentation

## 2011-04-02 DIAGNOSIS — F319 Bipolar disorder, unspecified: Secondary | ICD-10-CM | POA: Insufficient documentation

## 2011-04-02 DIAGNOSIS — K219 Gastro-esophageal reflux disease without esophagitis: Secondary | ICD-10-CM | POA: Insufficient documentation

## 2011-04-02 DIAGNOSIS — Z79899 Other long term (current) drug therapy: Secondary | ICD-10-CM | POA: Insufficient documentation

## 2011-04-09 NOTE — Op Note (Signed)
NAMECAYCE, [name]            ACCOUNT NO.:  [id number]   MEDICAL RECORD NO.:  [id number]          PATIENT TYPE:  AMB   LOCATION:  DAY                           FACILITY:  [facility]   PHYSICIAN:  R. Roetta Sessions, M.D. DATE OF BIRTH:  13-Feb-1986   DATE OF PROCEDURE:  08/26/2007  DATE OF DISCHARGE:                               OPERATIVE REPORT   PROCEDURE:  Diagnostic EGD followed by an ileocolonoscopy.   INDICATIONS FOR PROCEDURE:  [name] with a three-week  history of diarrhea with intermittent episodes of painless, low volume  hematochezia.  He has also had some heartburn and reflux symptoms and  reports some vomiting and hematemesis on one occasion.  Stool studies  were negative except for a yeast in the culture.  His upper GI tract  symptoms are much better now that he is gone from Pepcid to Prilosec 20  mg OTC.  EGD and colonoscopy are now being done.  This approach has been  discussed with the patient at length.  The potential risks, benefits,  and alternatives have been reviewed, questions answered, he is  agreeable, and please see the documentation in the medical record.   PROCEDURE NOTE:  O2 saturation, blood pressure, pulse, and respirations  were monitored throughout the entire procedure.  Conscious sedation with  Versed 7 mg IV and Demerol 175 mg IV in divided doses.  Phenergan 25 mg  release by slow IV push to augment conscious sedation.  Cetacaine spray  for topical oropharyngeal anesthesia.   FINDINGS:  EGD:  Examination of the tubular esophagus revealed normal  mucosa.  The EG junction was easily traversed into the stomach and  colon. The gastric cavity was seen and was insufflated well with air.  A  thorough examination of the gastric mucosa including retroflexion of the  proximal stomach/esophagogastric junction demonstrated only a small  hiatal hernia.  The pylorus was patent and easily traversed.  Examination of bulb and first and second portion  revealed entirely  normal-appearing mucosa.   THERAPEUTIC/DIAGNOSTIC MANEUVERS PERFORMED:  None.   COLONOSCOPY:  The patient tolerated the procedure well and was prepared  for colonoscopy.  Digital rectal exam revealed no abnormalities.  On  endoscopic findings, the prep was good.  Colon:  The colonic mucosa was  surveyed from the rectosigmoid junction through the left transverse and  right colon to the appendiceal orifices and ileocecal valve, and cecum.  These structures were well-seen and photographed for the record.  The  terminal ileum was then admitted to 5 cm.  From this level, the scope  was slowly withdrawn, and all previously mentioned mucosal surfaces were  again seen.  The colonic mucosa appeared entirely normal as did the  terminal ileal mucosal.  The scope was pulled out of the rectum with a  thorough examination of the rectal mucosa and clear retroflexed view of  the anal verge.  On-phos view of the anal canal demonstrated no  abnormalities.  The patient tolerated the procedures well and was  reacted in Endoscopy.   IMPRESSION:  1. Esophagogastroduodenoscopy:  Normal esophagus, a small hiatal  hernia, otherwise normal stomach D1 through D3.  2. Colonoscopy findings:  A normal rectum, colon, and terminal ileum.   I suspect the patient may well have had a food-borne or a viral  gastroenteritis.  He does have yeast in his stool, although this may not  be acting as a pathogen.  Sometimes we have, indeed, seen it be a  contributing factor towards diarrhea.  He also may be left with post  infectious irritable bowel syndrome since the GI bleeding was trivial in  nature.   RECOMMENDATIONS:  1. Continue Prilosec OTC 20 mg daily.  Antireflux literature was      provided [name].  2. He ought to go on a probiotic empirically.  I have recommended      Lactinex one capsule with meals t.i.d.  He can use Imodium p.r.n.      diarrhea.  3. Follow up on repeat stool  studies.  If yeast shows up again in his      stool, I would treat him empirically for a yeast infection.  4. Further recommendations to follow.      [name], M.D.  Electronically Signed     RMR/MEDQ  D:  08/26/2007  T:  08/26/2007  Job:  [id number]   cc:   [name], M.D.  Fax: [phone]

## 2011-04-09 NOTE — Telephone Encounter (Signed)
NAMEMarland Kitchen  [name], [name]             CHART#:  [id number]   DATE:  08/13/2007                       DOB:  13-Dec-1985   REASON FOR CONSULTATION:  Nausea, vomiting, diarrhea.   HISTORY OF PRESENT ILLNESS:  [name] is a 25 year old Caucasian  male.  He tells me approximately 1 week ago he developed diarrhea.  He  has been having upwards of 3 to 4 stools a day.  Yesterday morning he  got to work, he began to have nausea.  He vomited multiple times.  With  the last episode of vomiting, he noticed trace of bright red blood.  He  has recently been for MRSA to his right thigh.  This was on July 19, 2007.  He was given antibiotics, which he completed a couple of weeks  ago.  He did have some upper abdominal pain, which was intermittent,  usually lasted about 30 minutes at a time associated with the bouts of  diarrhea.  He, also, yesterday noticed some dark burgundy stool.  He  denies any ill contacts.  His weight has remained stable.  He denies  anorexia.  He has had some heartburn and indigestion.  He has been  taking Pepcid 40 mg each morning.  Denies any dysphagia or odynophagia.  Denies any foreign travel.   PAST MEDICAL AND SURGICAL HISTORY:  1. He was treated for MRSA of the right thigh on July 23, 2007.  2. Bipolar disorder.  3. Attention deficit hyperactivity disorder.  4. GERD.   CURRENT MEDICATIONS:  1. Prozac 20 mg daily.  2. Pepcid AC 20 mg daily.   ALLERGIES:  NO KNOWN DRUG ALLERGIES.   FAMILY HISTORY:  There is no know family history of carcinoma of the  liver or chronic GI problems.  Mother age 67 with history of seizure  disorder.  Father 102 who is healthy.  He has 1 brother who is in prison,  has history of bipolar disorder as well.   SOCIAL HISTORY:  [name] has been married for a year and a half.  He  has a 90-1/2-year-old son, and another child on the way.  He is a full  time Publishing rights manager.  He dips tobacco.  Denies any  alcohol or  drug use.   REVIEW OF SYSTEMS:  See HPI.  GU:  Denies any dysuria, hematuria, or  increased urinary frequency.   PHYSICAL EXAMINATION:  VITAL SIGNS:  Weight 313 pounds.  Height 70  inches.  Temperature 98.4.  Blood pressure 122/68.  Pulse 84.  GENERAL:  [name] is an obese Caucasian male who is alert, oriented,  pleasant, cooperative, and in no acute distress.  HEENT:  Sclerae are clear, nonicteric.  Conjunctivae are pink.  Oropharynx pink and moist without any lesions.  NECK:  Supple without thyromegaly.  CHEST:  Heart regular rate and rhythm.  Normal S1 and S2 without any  murmurs, clicks, rubs, or gallops.  LUNGS:  Clear to auscultation bilaterally.  ABDOMEN:  Protuberant with positive bowel sounds x4.  No bruits  auscultated.  Soft, nontender, non-distended without palpable mass or  hepatosplenomegaly.  No rebound tenderness or guarding.  RECTAL:  No external lesions visualized.  Good sphincter tone noted.  Palpated a small amount of light brown stool was obtained from the  vault, which was Hemoccult negative.  EXTREMITIES:  Without clubbing or edema bilaterally.   LABORATORY STUDIES:  From ER visit on August 12, 2007 include a  negative lipase, negative CMP, and normal CBC.   IMPRESSION:  [name] is a 25 year old Caucasian male with acute  onset of nausea, vomiting, and diarrhea.  The diarrhea has persisted for  about a week now.  He has been on and off antibiotics for a couple of  weeks.  I suspect he may have clostridium difficile or pseudomembranous  colitis.  Yesterday, he developed trace of bright red blood with  vomiting, which is suspicious for Mallory-Wise tear.  He also has had  gastroesophageal reflux disease symptoms for about 4 months, and would  be concerned about gastritis/esophagitis.   PLAN:  1. Discontinue Pepcid and begin Prilosec 20 mg daily.  We have given      him 2 weeks' worth of samples.  2. Stool for ovum and parasite, culture and  sensitivity, C. difficile,      lactoferrin, and E. coli.  3. Align once daily.  4. Once stool samples are obtained, he can begin Imodium 10 mg p.r.n.  5. [name] may need colonoscopy plus/minus EGD in the near future      if his symptoms persist.      Lorenza Burton, N.P.  Electronically Signed     R. Roetta Sessions, M.D.  Electronically Signed    KJ/MEDQ  D:  08/13/2007  T:  08/13/2007  Job:  [id number]   cc:   Delaney Meigs, M.D.

## 2011-04-09 NOTE — Op Note (Signed)
[name], [name]            ACCOUNT NO.:  [id number]   MEDICAL RECORD NO.:  [id number]          PATIENT TYPE:  AMB   LOCATION:  DAY                           FACILITY:  [facility]   PHYSICIAN:  R. Roetta Sessions, M.D. DATE OF BIRTH:  1986-09-11   DATE OF PROCEDURE:  04/07/2009  DATE OF DISCHARGE:                               OPERATIVE REPORT   INDICATIONS FOR PROCEDURE:  [name] with recurrent  hematemesis and melena and large-volume hematochezia.  He has had a  thorough evaluation including upper and lower endoscopy in the not too  distant past, has melena as well more recently.  There is concern he  could have a lesion in his upper GI tract, perhaps proximal small bowel.  EGD now being done to further evaluate his symptoms.  Risks, benefits,  alternatives, and limitations have been discussed, questions answered.  Please see the documentation in the medical record.   PROCEDURE NOTE:  O2 saturation, blood pressure, pulse and respirations  were monitored throughout the entire procedure.  Conscious sedation with  Versed 4 mg IV, Demerol 100 mg IV in divided doses.  Phenergan 25 mg  diluted slow IV push to augment conscious sedation.  Cetacaine spray for  topical pharyngeal anesthesia.   INSTRUMENT:  Pentax video chip system.   FINDINGS:  Examination of the tubular esophagus revealed no mucosal  abnormalities.  EG junction easily traversed.   Stomach:  Gastric cavity was emptied and insufflated well with air.  Thorough examination of the gastric mucosa including retroflexion of the  proximal stomach and esophagogastric junction demonstrated only a small  hiatal hernia.  Pylorus was patent and easily traversed.  Examination of  the bulb and second and third portions revealed no abnormalities.   THERAPEUTIC/DIAGNOSTIC MANEUVERS PERFORMED:  None.   The patient tolerated the procedure well and was reactive to endoscopy.   IMPRESSION:  Normal esophagus, small hiatal  hernia.  Otherwise normal  stomach.  D1-D3 appeared normal.  CBC from today revealed a white count  of 7.7, H and H 13.1 and 38.1, MCV 79.4 (lower limit of normal).   RECOMMENDATIONS:  I doubt significant GI bleeding, but given his  recurrent symptoms and negative findings on upper and lower endoscopic  evaluation, we should proceed with a Given small-bowel capsule study.  Will plan to perform this study next week.  He is to continue taking  Nexium 40 mg orally daily for gastroesophageal reflux disease.      [name], M.D.  Electronically Signed     RMR/MEDQ  D:  04/07/2009  T:  04/07/2009  Job:  [id number]

## 2011-04-09 NOTE — Op Note (Signed)
NAMEJIN, [name]            ACCOUNT NO.:  [id number]   MEDICAL RECORD NO.:  [id number]          PATIENT TYPE:  AMB   LOCATION:  DAY                           FACILITY:  [facility]   PHYSICIAN:  R. Roetta Sessions, M.D. DATE OF BIRTH:  Jul 30, 1986   DATE OF PROCEDURE:  04/11/2009  DATE OF DISCHARGE:  04/11/2009                               OPERATIVE REPORT   PROCEDURE PERFORMED:  Small bowel capsule.   INDICATIONS FOR PROCEDURE:  [name] with hematemesis,  melena and hematochezia.  EGD last week demonstrated only a small hiatal  hernia.  It is notable from Apr 07, 2009 his H and H was 13.1 and 38.1.  Capsule study now being done to rule out lesion in small bowel  contributing to the above-mentioned symptoms.  Approach has been  discussed with the patient previously.  Please see documentation in  medical record.   FINDINGS OF STUDY:  First gastric image acquired at 35 seconds.  First  duodenal image at 11 minutes 28 seconds.  First ileocecal valve image at  6 hours 9 minutes.  First cecal image 6 hours 9 minutes 41 seconds.   Small bowel mucosa appeared normal throughout.  There were some areas  where the vascular pattern was somewhat prominent but this would be  considered a variant of normal.   RECOMMENDATIONS:  1. Continue proton pump inhibitor therapy for gastroesophageal reflux      disease.  Follow-up appointment with [place] in 6 weeks.  2. We will check a CBC prior to his office visit with [place].      [name], M.D.  Electronically Signed     RMR/MEDQ  D:  04/12/2009  T:  04/12/2009  Job:  [id number]   cc:   R. Roetta Sessions, M.D.  [address]  [place]  [place] [address]

## 2011-04-09 NOTE — Assessment & Plan Note (Signed)
NAMEMarland Kitchen  [name], [name]             CHART#:  [id number]   DATE:  01/30/2009                       DOB:  01/01/1986   CHIEF COMPLAINT:  Followup of procedure.   SUBJECTIVE:  The patient is here for a followup visit.  He underwent an  EGD on 01/09/2009 for longstanding GERD and recent hematemesis.  He had  distal esophageal erosions consistent with mild erosive reflux  esophagitis and a small hiatal hernia.  He was asked to begin Kapidex.  He did get some samples.  He was unable to fill the prescription because  he states it cost over 300 dollars.  He has been using some over-the-  counter Prilosec off and on.  He states he still has heartburn several  times a week.  His symptoms are mostly nocturnally around 2 a.m.  He  gets woke up with them.  He has been trying to eliminate spicy foods.  He consumes about 36 ounces of carbonated caffeine free beverages daily.  He does not eat late at night.  He is obese.  He also dips tobacco, but  does not smoke.  He denies any dysphagia or odynophagia.  He asked that  I write him for pantoprazole, which he can get on the [employer]-Dollar program  at [facility].  He also notes that he has chronic intermittent diarrhea.  He states that it lasts for a couple of weeks at a time.  About 4 months  ago, he had more than 5 stools a day.  He could hardly ever leave his  house.  He states the last couple of weeks, he has had no more symptoms.  He is having 3-4 solid stools a day.  He has had similar symptoms in the  past, which we evaluated for him in 2008.  At that time, he had a stool  culture, which did grow some yeast.  He also had a ileocolonoscopy which  was normal.  Symptoms at this point began after taking antibiotics for  MRSA.  He had a repeat stool culture, which at that point did not grow  any yeast, therefore, was not treated.  He does complain of waking up in  the middle of the night to have a bowel movement a couple of times a  week.  He has some fleeting  lower abdominal pain intermittently, but  states it is very mild.  He denies any blood in the stool or melena.   CURRENT MEDICATIONS:  1. Prozac 20 mg daily.  2. Prilosec OTC 20 mg p.r.n.   ALLERGIES:  No known drug allergies.   PHYSICAL EXAMINATION:  VITAL SIGNS:  Weight 327 pounds, he was 313 in  September 2008.  Temp 98.5, blood pressure 122/80 with large cuff, and  pulse is 70.  GENERAL:  A pleasant, obese, Caucasian gentleman in no acute distress.  SKIN:  Warm and dry.  No jaundice.  HEENT:  Sclerae nonicteric.  Oropharyngeal mucosa moist and pink.  CHEST:  Lungs are clear to auscultation.  CARDIAC:  Regular rate and rhythm.  No murmurs.  ABDOMEN:  Protuberant, positive bowel sounds, soft, and nontender.  No  organomegaly or masses appreciated, but limited due to body habitus.  LOWER EXTREMITIES:  No edema.   IMPRESSION:  The patient is a 25 year old gentleman with erosive reflux  esophagitis.  He needs to get  on daily PPI therapy and pantoprazole  would likely be his cheapest option given that he is uninsured.  Recommended that he take pantoprazole 40 mg everyday.  We discussed  antireflux measures today as well.  He has chronic intermittent  diarrhea, which may be due to irritable bowel syndrome.  However, it is  concerning that he describes nocturnal diarrhea.  He states he did well  previously on probiotic and would like to go that route initially.  Given that he did have a quite extensive evaluation in the past, I feel  this is reasonable.   PLAN:  1. Pantoprazole 40 mg p.o. daily, #31 and 11 refills, sent      electronically to [facility] in [place].  2. Align 1 p.o. daily, 4 weeks samples provided.  3. He will call with the progress report in 4 weeks.  Based on how he      is doing, he may need to have further evaluation for his diarrhea.      We did discuss that if symptoms continue to be recurrent, we would      at least want to obtain another set of stool samples.   We would      also consider checking for celiac disease, although, he does not      have any significant weight loss or nutritional deficiencies, but      this needs to be considered as well.  4. He states that he is in agreement with this plan and we will follow      up in 4 weeks to see the progress report and further      recommendations to follow at that time.       [name], P.A.  Electronically Signed     R. Roetta Sessions, M.D.  Electronically Signed    LL/MEDQ  D:  01/30/2009  T:  01/30/2009  Job:  [id number]   cc:   Delaney Meigs, M.D.

## 2011-04-09 NOTE — Op Note (Signed)
[name], [name]            ACCOUNT NO.:  [id number]   MEDICAL RECORD NO.:  [id number]          PATIENT TYPE:  AMB   LOCATION:  DAY                           FACILITY:  [facility]   PHYSICIAN:  R. Roetta Sessions, M.D. DATE OF BIRTH:  1986/10/18   DATE OF PROCEDURE:  01/09/2009  DATE OF DISCHARGE:                               OPERATIVE REPORT   Diagnostic esophagogastroduodenoscopy.   INDICATIONS FOR PROCEDURE:  [name] with longstanding  gastroesophageal reflux disease.  He is out of his PPI and has been on  p.r.n. OTC H2 blocker therapy.  He has significant daily symptoms, had  some nausea and vomiting and brought up some red blood in his as vomitus  on January 06, 2009 and was seen by Rhae Lerner. Margretta Ditty, M.D.  His  hemoglobin was normal at that time.  His hemoglobin is normal today.  He  also has problems with chronic nonbloody diarrhea.  He has been stable  over the week and he presents here for EGD at Dr. Roseanne Reno request.  I  have seen and examined [name] and agree that EGD would be in order.  Risks, benefits, alternatives and limitations of this approach have been  reviewed, and questions answered.  All parties are agreeable.   PROCEDURE NOTE:  O2 saturation, blood pressure, pulse and respirations  were monitored throughout entire procedure.  Conscious sedation was  Versed 6 mg IV, Demerol 150 mg IV, Phenergan 25 mg delivered with slow  IV push to augment conscious sedation.  Cetacaine spray for topical  pharyngeal anesthesia.   FINDINGS:  Examination of the tubular esophagus revealed circumferential  distal esophageal erosions within 1 cm of the GE junction.  There was no  Barrett's esophagus or other abnormality.  EG junction easily traversed.  Stomach:  Gastric cavity was emptied and insufflated well with air.  Thorough examination of gastric mucosa including retroflexion of  proximal stomach, esophagogastric junction demonstrated only a small  hiatal hernia.  Pylorus was patent, easily traversed.  Examination of  the bulb and second portion revealed no abnormalities.   THERAPEUTIC/DIAGNOSTIC MANEUVERS PERFORMED:  None.   The patient tolerated the procedure well, was reacted in endoscopy.   IMPRESSION:  Distal esophageal erosions consistent with mild erosive  reflux esophagitis, otherwise normal esophagus, small hiatal hernia,  otherwise normal stomach, D1-D2.   I suspect trivial upper GI bleed, uncontrolled gastroesophageal reflux  disease.   RECOMMENDATIONS:  1. Begin Kapidex 60 mg orally daily before breakfast.  I gave him a      prescription.  He is to go by my office for free samples.      Antireflux lifestyle literature provided to [name].  2. Arrangement to be seen by [place] in the office in 2-3 weeks to delve      into his diarrhea further.      [name], M.D.  Electronically Signed     RMR/MEDQ  D:  01/09/2009  T:  01/09/2009  Job:  [id number]

## 2011-05-22 ENCOUNTER — Telehealth: Payer: Self-pay

## 2011-05-22 NOTE — Telephone Encounter (Signed)
pts wife called- stated pt has had some rectal bleeding for a couple of days. Pt has not been seen in the office since 2010 and no showed for his last scheduled appt in 02/2010. Advised pt he needed ov. Scheduled him for Monday 05/27/11 with KJ. Pt to see PCP or ED if symptoms get worse.

## 2011-05-23 NOTE — Telephone Encounter (Signed)
Agree 

## 2011-05-27 ENCOUNTER — Encounter: Payer: Self-pay | Admitting: Urgent Care

## 2011-05-27 ENCOUNTER — Ambulatory Visit (INDEPENDENT_AMBULATORY_CARE_PROVIDER_SITE_OTHER): Payer: Medicaid Other | Admitting: Urgent Care

## 2011-05-27 VITALS — BP 130/76 | HR 91 | Temp 97.2°F | Ht 72.0 in | Wt 367.0 lb

## 2011-05-27 DIAGNOSIS — K219 Gastro-esophageal reflux disease without esophagitis: Secondary | ICD-10-CM

## 2011-05-27 DIAGNOSIS — K602 Anal fissure, unspecified: Secondary | ICD-10-CM | POA: Insufficient documentation

## 2011-05-27 MED ORDER — NITROGLYCERIN 0.4 % RE OINT: 1.0000 [in_us] | TOPICAL_OINTMENT | Freq: Two times a day (BID) | RECTAL | Status: DC

## 2011-05-27 MED ORDER — DEXLANSOPRAZOLE 60 MG PO CPDR: 60.0000 mg | DELAYED_RELEASE_CAPSULE | Freq: Every day | ORAL | Status: AC

## 2011-05-27 NOTE — Progress Notes (Signed)
AGREE

## 2011-05-27 NOTE — Progress Notes (Signed)
Cc to PCP 

## 2011-05-27 NOTE — Progress Notes (Signed)
Referring Provider: Allie Dimmer,* Primary Care Physician:  Dolores Hoose, OTR Primary Gastroenterologist:  Dr. Jena Gauss  Chief Complaint  Patient presents with  . Rectal Bleeding    HPI:  [name] is a 25 y.o. male here for evaluation rectal bleeding.  Hx obscure GI bleed in 2008-2010 documented below.  Noticed bright red blood w/ clots in large amts on toilet tissue last week.  Noticed 6 times.  1 episode of loose stool, otherwise semi-formed.  No abdominal pain now.  Denies nausea or vomiting.  Denied fever or chills.  Denies proctalgia.  C/o heartburn like crazy taking nexium 40mg  up to three per day.  Denies dysphagia or odynophagia.  Wt steadily increasing.  Appetite fine.  Past Medical History  Diagnosis Date  . Tobacco dipper   . GERD (gastroesophageal reflux disease)   . ADHD (attention deficit hyperactivity disorder)   . Bipolar 1 disorder   . Hx MRSA infection 8/08    right thigh  . S/P colonoscopy 11/10, 10/08    Dr Elmer Ramp  . S/P endoscopy 10/08, 2/10, 8/10    Dr Darnelle Going reflux esophagitis, small HH  . Occult GI bleeding 5/10    normal SB GIVENS capsule study   No past surgical history on file.  Current Outpatient Prescriptions  Medication Sig Dispense Refill  . dexlansoprazole (DEXILANT) 60 MG capsule Take 1 capsule (60 mg total) by mouth daily.  31 capsule  11  . esomeprazole (NEXIUM) 40 MG capsule Take 40 mg by mouth daily before breakfast.        . Nitroglycerin 0.4 % OINT Place 1 inch rectally 2 (two) times daily.  30 g  0   Allergies as of 05/27/2011  . (No Known Allergies)   Family History:There is no known family history of colorectal carcinoma , liver disease, or inflammatory bowel disease.  Problem Relation Age of Onset  . Leukemia Father 89  . Seizures Mother    History   Social History  . Marital Status: Married    Spouse Name: N/A    Number of Children: 2  . Years of Education: N/A   Occupational  History  . [employer]   . service tech    Social History Main Topics  . Smoking status: Never Smoker   . Smokeless tobacco: Current User    Types: Chew  . Alcohol Use: Yes     2 six packs in a week  . Drug Use: No  . Sexually Active: Not on file  Review of Systems: Gen: Denies any fever, chills, sweats, anorexia, fatigue, weakness, malaise, weight loss, and sleep disorder CV: Denies chest pain, angina, palpitations, syncope, orthopnea, PND, peripheral edema, and claudication. Resp: Denies dyspnea at rest, dyspnea with exercise, cough, sputum, wheezing, coughing up blood, and pleurisy. GI: Denies vomiting blood, jaundice, and fecal incontinence.   Derm: Denies rash, itching, dry skin, hives, moles, warts, or unhealing ulcers.  Psych: Denies depression, anxiety, memory loss, suicidal ideation, hallucinations, paranoia, and confusion. Heme: Denies bruising and enlarged lymph nodes.  Physical Exam: BP 130/76  Pulse 91  Temp(Src) 97.2 F (36.2 C) (Temporal)  Ht 6' (1.829 m)  Wt 367 lb (166.47 kg)  BMI 49.77 kg/m2 General:   Alert,  Well-developed,obese, pleasant and cooperative in NAD Head:  Normocephalic and atraumatic. Eyes:  Sclera clear, no icterus.   Conjunctiva pink. Mouth:  No deformity or lesions, dentition normal. Neck:  Supple; no masses or thyromegaly. Heart:  Regular rate and rhythm;  no murmurs, clicks, rubs,  or gallops. Abdomen:  Soft, obese nontender and nondistended. No masses, hepatosplenomegaly or hernias noted. Normal bowel sounds, without guarding, and without rebound.   Msk:  Symmetrical without gross deformities. Normal posture. Pulses:  Normal pulses noted. Rectal:  1cm anal fissure, non-bleeding at 6 o'clock.  +tenderness. Extremities:  Without clubbing or edema. Neurologic:  Alert and  oriented x4;  grossly normal neurologically. Skin:  Intact without significant lesions or rashes. Cervical Nodes:  No significant cervical adenopathy. Psych:  Alert  and cooperative. Normal mood and affect.

## 2011-05-27 NOTE — Assessment & Plan Note (Signed)
Refractory symptoms on BID Nexium.  Stop nexium.  Trial Dexilant 60mg  daily (2 boxes samples given).    Call if no better, otherwise OV in 1 yr Urged wt loss. Low fat/low cholestrol diet HO given

## 2011-05-27 NOTE — Patient Instructions (Addendum)
Take dexilant 60mg  daily Stop nexium Wt loss 1-2# per week Low fat & low cholesterol Limit alcohol to 1 drink per day   Low-Fat, Low-Saturated-Fat, Low-Cholesterol Diets Food Selection Guide BREADS, CEREALS, PASTA, RICE, DRIED PEAS, AND BEANS These products are high in carbohydrates and most are low in fat. Therefore, they can be increased in the diet as substitutes for fatty foods. They too, however, contain calories and should not be eaten in excess. Cereals can be eaten for snacks as well as for breakfast.  Include foods that contain fiber (fruits, vegetables, whole grains, and legumes). Research shows that fiber may lower blood cholesterol levels, especially the water-soluble fiber found in fruits, vegetables, oat products, and legumes. FRUITS AND VEGETABLES It is good to eat fruits and vegetables. Besides being sources of fiber, both are rich in vitamins and some minerals. They help you get the daily allowances of these nutrients. Fruits and vegetables can be used for snacks and desserts. MEATS Limit lean meat, chicken, [place], and fish to no more than 6 ounces per day. Beef, Pork, and Lamb  Use lean cuts of beef, pork, and lamb. Lean cuts include:   Extra-lean ground beef.   Arm roast.   Sirloin tip.   Center-cut ham.   Round steak.   Loin chops.   Rump roast.   Tenderloin.  Trim all fat off the outside of meats before cooking. It is not necessary to severely decrease the intake of red meat, but lean choices should be made. Lean meat is rich in protein and contains a highly absorbable form of iron. Premenopausal women, in particular, should avoid reducing lean red meat because this could increase the risk for low red blood cells (iron-deficiency anemia). Processed Meats Processed meats, such as bacon, bologna, salami, sausage, and hot dogs contain large quantities of fat, are not rich in valuable nutrients, and should not be eaten very often. Organ Meats The organ meats,  such as liver, sweetbreads, kidneys, and brain are very rich in cholesterol. They should be limited. Chicken and [place] These are good sources of protein. The fat of poultry can be reduced by removing the skin and underlying fat layers before cooking. Chicken and [place] can be substituted for lean red meat in the diet. Poultry should not be fried or covered with high-fat sauces. Fish and Shellfish Fish is a good source of protein. Shellfish contain cholesterol, but they usually are low in saturated fatty acids. The preparation of fish is important. Like chicken and [place], they should not be fried or covered with high-fat sauces. EGGS Egg yolks often are hidden in cooked and processed foods. Egg whites contain no fat or cholesterol. They can be eaten often. Try 1 to 2 egg whites instead of whole eggs in recipes or use egg substitutes that do not contain yolk. MILK AND DAIRY PRODUCTS Use skim or 1% milk instead of 2% or whole milk. Decrease whole milk, natural, and processed cheeses. Use nonfat or low-fat (2%) cottage cheese or low-fat cheeses made from vegetable oils. Choose nonfat or low-fat (1 to 2%) yogurt. Experiment with evaporated skim milk in recipes that call for heavy cream. Substitute low-fat yogurt or low-fat cottage cheese for sour cream in dips and salad dressings. Have at least 2 servings of low-fat dairy products, such as 2 glasses of skim (or 1%) milk each day to help get your daily calcium intake. FATS AND OILS Reduce the total intake of fats, especially saturated fat. Butterfat, lard, and beef fats are high  in saturated fat and cholesterol. These should be avoided as much as possible. Vegetable fats do not contain cholesterol, but certain vegetable fats, such as coconut oil, palm oil, and palm kernel oil are very high in saturated fats. These should be limited. These fats are often used in bakery goods, processed foods, popcorn, oils, and nondairy creamers. Vegetable shortenings and some  peanut butters contain hydrogenated oils, which are also saturated fats. Read the labels on these foods and check for saturated vegetable oils. Unsaturated vegetable oils and fats do not raise blood cholesterol. However, they should be limited because they are fats and are high in calories. Total fat should still be limited to 30% of your daily caloric intake. Desirable liquid vegetable oils are corn oil, cottonseed oil, olive oil, canola oil, safflower oil, soybean oil, and sunflower oil. Peanut oil is not as good, but small amounts are acceptable. Buy a heart-healthy tub margarine that has no partially hydrogenated oils in the ingredients. Mayonnaise and salad dressings often are made from unsaturated fats, but they should also be limited because of their high calorie and fat content. Seeds, nuts, peanut butter, olives, and avocados are high in fat, but the fat is mainly the unsaturated type. These foods should be limited mainly to avoid excess calories and fat. OTHER EATING TIPS Snacks  Most sweets should be limited as snacks. They tend to be rich in calories and fats, and their caloric content outweighs their nutritional value. Some good choices in snacks are graham crackers, melba toast, soda crackers, bagels (no egg), English muffins, fruits, and vegetables. These snacks are preferable to snack crackers, [place] fries, and chips. Popcorn should be air-popped or cooked in small amounts of liquid vegetable oil. Desserts Eat fruit, low-fat yogurt, and fruit ices instead of pastries, cake, and cookies. Sherbet, angel food cake, gelatin dessert, frozen low-fat yogurt, or other frozen products that do not contain saturated fat (pure fruit juice bars, frozen ice pops) are also acceptable.  COOKING METHODS Choose those methods that use little or no fat. They include:  Poaching.   Braising.   Steaming.   Grilling.   Baking.   Stir-frying.   Broiling.   Microwaving.  Foods can be cooked in a  nonstick pan without added fat, or use a nonfat cooking spray in regular cookware. Limit fried foods and avoid frying in saturated fat. Add moisture to lean meats by using water, broth, cooking wines, and other nonfat or low-fat sauces along with the cooking methods mentioned above. Soups and stews should be chilled after cooking. The fat that forms on top after a few hours in the refrigerator should be skimmed off. When preparing meals, avoid using excess salt. Salt can contribute to raising blood pressure in some people. EATING AWAY FROM HOME Order entres, potatoes, and vegetables without sauces or butter. When meat exceeds the size of a deck of cards (3 to 4 ounces), the rest can be taken home for another meal. Choose vegetable or fruit salads and ask for low-calorie salad dressings to be served on the side. Use dressings sparingly. Limit high-fat toppings, such as bacon, crumbled eggs, cheese, sunflower seeds, and olives. Ask for heart-healthy tub margarine instead of butter. Document Released: 05/03/2002 Document Re-Released: 02/05/2010 [facility] Patient Information 2011 [place], [place].cholesterol diet  Anal Fissure An anal fissure often begins with sharp pain. This is usually following a bowel movement. It often causes bright red blood stained stools. It is the most common cause of rectal bleeding. One common cause  of this is passage of a large, hard stool. It can also be caused by having frequent diarrheal stools. Anal fissures that occur for a longtime (chronic) may require surgery. CAUSES  Passing large, hard stools.   Frequent diarrheal stools.   Constipation.  SYMPTOMS  Bright red, blood stained stools.   Rectal bleeding.  HOME CARE INSTRUCTIONS  If constipation is the cause of the rectal fissure, it may be necessary to add a bulk-forming laxative. A diet high in fruits, whole grains, and vegetables will also help.   Taking hot sitz baths for 1 half hour 4 times per day may  help.   Increase your fluid intake.   Only take over-the-counter or prescription medicines for pain, discomfort, or fever as directed by your caregiver. Do not take aspirin as this may increase the tendency for bleeding.   Do not use ointments containing anesthetic medications or hydrocortisone. They could slow healing.   Avoid constipating foods such as bananas and cheese.   In children, brown Karo syrup may be used by adding 1 teaspoon of syrup to 8 ounces of formula. An alternative is to give 3 teaspoons of mineral oil every day.  SEEK MEDICAL CARE IF: Rectal bleeding continues, changes in intensity, or becomes more severe. MAKE SURE YOU:   Understand these instructions.   Will watch your condition.   Will get help right away if you are not doing well or get worse.  Document Released: 11/11/2005 Document Re-Released: 02/05/2010 [facility] Patient Information 2011 [place], [place].

## 2011-05-27 NOTE — Assessment & Plan Note (Signed)
[name] is a 25 y.o. obese caucasian male w/ rectal bleeding.  He has an anal fissure to account for his symptoms.  He has previously had 2 colonoscopies.  Rectiv BID x 3 weeks Sitz baths Keep clean & dry Fissure literature given

## 2011-07-02 ENCOUNTER — Encounter (HOSPITAL_BASED_OUTPATIENT_CLINIC_OR_DEPARTMENT_OTHER): Payer: Self-pay | Admitting: *Deleted

## 2011-07-02 ENCOUNTER — Emergency Department (HOSPITAL_BASED_OUTPATIENT_CLINIC_OR_DEPARTMENT_OTHER)
Admission: EM | Admit: 2011-07-02 | Discharge: 2011-07-02 | Disposition: A | Discharge status: Home / Self Care | Payer: Self-pay | Attending: Emergency Medicine | Admitting: Emergency Medicine

## 2011-07-02 DIAGNOSIS — R197 Diarrhea, unspecified: Secondary | ICD-10-CM | POA: Insufficient documentation

## 2011-07-02 DIAGNOSIS — E669 Obesity, unspecified: Secondary | ICD-10-CM | POA: Insufficient documentation

## 2011-07-02 DIAGNOSIS — F319 Bipolar disorder, unspecified: Secondary | ICD-10-CM | POA: Insufficient documentation

## 2011-07-02 DIAGNOSIS — R109 Unspecified abdominal pain: Secondary | ICD-10-CM | POA: Insufficient documentation

## 2011-07-02 DIAGNOSIS — K219 Gastro-esophageal reflux disease without esophagitis: Secondary | ICD-10-CM | POA: Insufficient documentation

## 2011-07-02 MED ORDER — ONDANSETRON 8 MG PO TBDP
8.0000 mg | ORAL_TABLET | Freq: Once | ORAL | Status: AC
  Administered 2011-07-02: 8 mg via ORAL
  Filled 2011-07-02: qty 1

## 2011-07-02 MED ORDER — ONDANSETRON HCL 8 MG PO TABS: ORAL_TABLET | ORAL | Status: DC

## 2011-07-02 NOTE — ED Notes (Signed)
C/o lower abdominal pains x3days with diarrhea. Vomiting began last night. Multiple episodes of each. Taking Imodium with minimal relief. Denies fever.

## 2011-07-02 NOTE — ED Notes (Signed)
MD at bedside. DR Denton Lank at bedside.

## 2011-07-02 NOTE — ED Notes (Signed)
Pt reports his children recently had similar symptoms last week and diagnosed with "stomach virus"

## 2011-07-02 NOTE — ED Provider Notes (Signed)
History     CSN: [id number] Arrival date & time: 07/02/2011  5:50 AM  Chief Complaint  Patient presents with  . Abdominal Pain   Patient is a 25 y.o. male presenting with abdominal pain and diarrhea.  Abdominal Pain The primary symptoms of the illness include nausea, vomiting and diarrhea. The primary symptoms of the illness do not include fever, shortness of breath, hematemesis or dysuria.  Symptoms associated with the illness do not include back pain.  Diarrhea The primary symptoms include nausea, vomiting and diarrhea. Primary symptoms do not include fever, hematemesis, dysuria or rash. The illness began 2 days ago. The onset was sudden. The problem has been gradually improving.  The illness does not include back pain.  pt indicates for past 2-3 days has had diarrheal stools, describes as loose, 3-5 times per day. Diarrhea not bloody or mucousy. No recent abx use. Child had recent similar symptoms, no other known ill contacts or travel. No known bad food ingestion. No fever or chills. occassional mid to lower abd crampy discomfort, no constant or focal abd pain. Had episode nv last pm, emesis clear, not bloody. No faintness or dizziness.   Past Medical History  Diagnosis Date  . Tobacco dipper   . GERD (gastroesophageal reflux disease)   . ADHD (attention deficit hyperactivity disorder)   . Hx MRSA infection 8/08    right thigh  . S/P colonoscopy 11/10, 10/08    Dr Elmer Ramp  . S/P endoscopy 10/08, 2/10, 8/10    Dr Darnelle Going reflux esophagitis, small HH  . Occult GI bleeding 5/10    normal SB GIVENS capsule study  . ADHD (attention deficit hyperactivity disorder)   . Bipolar 1 disorder     Past Surgical History  Procedure Date  . Vasectomy     Family History  Problem Relation Age of Onset  . Leukemia Father 15  . Seizures Mother     History  Substance Use Topics  . Smoking status: Never Smoker   . Smokeless tobacco: Current User    Types: Chew  . Alcohol  Use: Yes     2 six packs in a week      Review of Systems  Constitutional: Negative for fever.  HENT: Negative for sore throat and neck pain.   Eyes: Negative for pain.  Respiratory: Negative for cough and shortness of breath.   Cardiovascular: Negative for chest pain.  Gastrointestinal: Positive for nausea, vomiting and diarrhea. Negative for blood in stool and hematemesis.  Genitourinary: Negative for dysuria and flank pain.  Musculoskeletal: Negative for back pain.  Skin: Negative for rash.  Neurological: Negative for headaches.  Hematological: Does not bruise/bleed easily.    Physical Exam  BP 135/71  Pulse 79  Temp(Src) 97.6 F (36.4 C) (Oral)  Resp 18  Ht 6' (1.829 m)  Wt 340 lb (154.223 kg)  BMI 46.11 kg/m2  SpO2 99%  Physical Exam  Nursing note and vitals reviewed. Constitutional: He is oriented to person, place, and time. He appears well-developed and well-nourished. No distress.  HENT:  Head: Atraumatic.  Mouth/Throat: Oropharynx is clear and moist.  Eyes: Pupils are equal, round, and reactive to light.  Neck: Neck supple. No tracheal deviation present.  Cardiovascular: Normal rate, regular rhythm and normal heart sounds.  Exam reveals no gallop and no friction rub.   No murmur heard. Pulmonary/Chest: Effort normal. No accessory muscle usage. No respiratory distress.  Abdominal: Soft. Bowel sounds are normal. He exhibits no distension and no mass.  There is no tenderness. There is no rebound and no guarding.       Soft, obese, nt. No hernia.   Genitourinary:       No cva tenderness  Musculoskeletal: Normal range of motion. He exhibits no edema.  Neurological: He is alert and oriented to person, place, and time.  Skin: Skin is warm and dry. No rash noted.  Psychiatric: He has a normal mood and affect.    ED Course  Procedures  MDM Pt given zofran po for nausea. Po fluids.     Recheck no diarrhea or vomiting in ed. Tolerating po fluids .   Suzi Roots, MD 07/02/11 [phone]

## 2011-07-02 NOTE — ED Notes (Signed)
Pt encouraged to take sips of fluids

## 2011-07-08 ENCOUNTER — Emergency Department (HOSPITAL_BASED_OUTPATIENT_CLINIC_OR_DEPARTMENT_OTHER)
Admission: EM | Admit: 2011-07-08 | Discharge: 2011-07-09 | Disposition: A | Discharge status: Home / Self Care | Payer: Self-pay | Attending: Emergency Medicine | Admitting: Emergency Medicine

## 2011-07-08 ENCOUNTER — Emergency Department (INDEPENDENT_AMBULATORY_CARE_PROVIDER_SITE_OTHER): Payer: Self-pay

## 2011-07-08 ENCOUNTER — Encounter (HOSPITAL_BASED_OUTPATIENT_CLINIC_OR_DEPARTMENT_OTHER): Payer: Self-pay

## 2011-07-08 DIAGNOSIS — K219 Gastro-esophageal reflux disease without esophagitis: Secondary | ICD-10-CM | POA: Insufficient documentation

## 2011-07-08 DIAGNOSIS — Z79899 Other long term (current) drug therapy: Secondary | ICD-10-CM | POA: Insufficient documentation

## 2011-07-08 DIAGNOSIS — K5289 Other specified noninfective gastroenteritis and colitis: Secondary | ICD-10-CM

## 2011-07-08 DIAGNOSIS — R1110 Vomiting, unspecified: Secondary | ICD-10-CM | POA: Insufficient documentation

## 2011-07-08 DIAGNOSIS — E860 Dehydration: Secondary | ICD-10-CM

## 2011-07-08 DIAGNOSIS — R197 Diarrhea, unspecified: Secondary | ICD-10-CM

## 2011-07-08 DIAGNOSIS — R55 Syncope and collapse: Secondary | ICD-10-CM

## 2011-07-08 DIAGNOSIS — R109 Unspecified abdominal pain: Secondary | ICD-10-CM

## 2011-07-08 DIAGNOSIS — F909 Attention-deficit hyperactivity disorder, unspecified type: Secondary | ICD-10-CM | POA: Insufficient documentation

## 2011-07-08 DIAGNOSIS — R404 Transient alteration of awareness: Secondary | ICD-10-CM | POA: Insufficient documentation

## 2011-07-08 DIAGNOSIS — G8929 Other chronic pain: Secondary | ICD-10-CM | POA: Insufficient documentation

## 2011-07-08 DIAGNOSIS — R51 Headache: Secondary | ICD-10-CM

## 2011-07-08 DIAGNOSIS — Q619 Cystic kidney disease, unspecified: Secondary | ICD-10-CM

## 2011-07-08 LAB — COMPREHENSIVE METABOLIC PANEL
Albumin: 4.3 g/dL (ref 3.5–5.2)
Alkaline Phosphatase: 64 U/L (ref 39–117)
BUN: 12 mg/dL (ref 6–23)
CO2: 26 mEq/L (ref 19–32)
Chloride: 97 mEq/L (ref 96–112)
Creatinine, Ser: 1.1 mg/dL (ref 0.50–1.35)
GFR calc Af Amer: >60 mL/min (ref >60)
GFR calc non Af Amer: >60 mL/min (ref >60)
Glucose, Bld: 101 mg/dL — ABNORMAL HIGH (ref 70–99)
Potassium: 4.1 mEq/L (ref 3.5–5.1)
Total Bilirubin: 0.8 mg/dL (ref 0.3–1.2)

## 2011-07-08 LAB — CBC
HCT: 44.2 % (ref 39.0–52.0)
Hemoglobin: 14.9 g/dL (ref 13.0–17.0)
MCV: 80.2 fL (ref 78.0–100.0)
RDW: 14.3 % (ref 11.5–15.5)
WBC: 13.5 10*3/uL — ABNORMAL HIGH (ref 4.0–10.5)

## 2011-07-08 MED ORDER — ONDANSETRON HCL 4 MG/2ML IJ SOLN
INTRAMUSCULAR | Status: AC
  Filled 2011-07-08: qty 2

## 2011-07-08 MED ORDER — SODIUM CHLORIDE 0.9 % IV BOLUS (SEPSIS)
1000.0000 mL | Freq: Once | INTRAVENOUS | Status: AC
  Administered 2011-07-08: 1000 mL via INTRAVENOUS

## 2011-07-08 MED ORDER — ONDANSETRON HCL 4 MG/2ML IJ SOLN
4.0000 mg | Freq: Once | INTRAMUSCULAR | Status: AC
  Administered 2011-07-08: 4 mg via INTRAVENOUS

## 2011-07-08 MED ORDER — DIPHENHYDRAMINE HCL 50 MG/ML IJ SOLN
50.0000 mg | Freq: Once | INTRAMUSCULAR | Status: AC
  Administered 2011-07-08: 50 mg via INTRAVENOUS
  Filled 2011-07-08: qty 1

## 2011-07-08 MED ORDER — METOCLOPRAMIDE HCL 5 MG/ML IJ SOLN
10.0000 mg | Freq: Once | INTRAMUSCULAR | Status: AC
  Administered 2011-07-08: 10 mg via INTRAVENOUS
  Filled 2011-07-08: qty 2
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
## 2011-07-08 NOTE — ED Notes (Signed)
Gave pt urinal, pt not able to void at this time.
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
## 2011-07-09 LAB — URINALYSIS, ROUTINE W REFLEX MICROSCOPIC
Ketones, ur: NEGATIVE mg/dL
Leukocytes, UA: NEGATIVE
Nitrite: NEGATIVE
Protein, ur: NEGATIVE mg/dL
Urobilinogen, UA: 1 mg/dL (ref 0.0–1.0)

## 2011-07-09 MED ORDER — IOHEXOL 300 MG/ML  SOLN
100.0000 mL | Freq: Once | INTRAMUSCULAR | Status: AC | PRN
  Administered 2011-07-09: 100 mL via INTRAVENOUS

## 2011-07-09 NOTE — ED Notes (Signed)
Pt resting with eyes closed, IV site unremarkable, resps even and unlabored. Family at bs.

## 2011-07-09 NOTE — ED Notes (Signed)
Gave pt urinal to try for UA.

## 2011-09-01 ENCOUNTER — Emergency Department (INDEPENDENT_AMBULATORY_CARE_PROVIDER_SITE_OTHER): Payer: Self-pay

## 2011-09-01 ENCOUNTER — Emergency Department (HOSPITAL_BASED_OUTPATIENT_CLINIC_OR_DEPARTMENT_OTHER)
Admission: EM | Admit: 2011-09-01 | Discharge: 2011-09-01 | Disposition: A | Discharge status: Home / Self Care | Payer: Self-pay | Attending: Emergency Medicine | Admitting: Emergency Medicine

## 2011-09-01 ENCOUNTER — Encounter (HOSPITAL_BASED_OUTPATIENT_CLINIC_OR_DEPARTMENT_OTHER): Payer: Self-pay | Admitting: *Deleted

## 2011-09-01 DIAGNOSIS — M542 Cervicalgia: Secondary | ICD-10-CM

## 2011-09-01 DIAGNOSIS — IMO0002 Reserved for concepts with insufficient information to code with codable children: Secondary | ICD-10-CM | POA: Insufficient documentation

## 2011-09-01 DIAGNOSIS — S139XXA Sprain of joints and ligaments of unspecified parts of neck, initial encounter: Secondary | ICD-10-CM | POA: Insufficient documentation

## 2011-09-01 DIAGNOSIS — F319 Bipolar disorder, unspecified: Secondary | ICD-10-CM | POA: Insufficient documentation

## 2011-09-01 DIAGNOSIS — Y92009 Unspecified place in unspecified non-institutional (private) residence as the place of occurrence of the external cause: Secondary | ICD-10-CM | POA: Insufficient documentation

## 2011-09-01 DIAGNOSIS — S161XXA Strain of muscle, fascia and tendon at neck level, initial encounter: Secondary | ICD-10-CM

## 2011-09-01 MED ORDER — KETOROLAC TROMETHAMINE 60 MG/2ML IM SOLN
60.0000 mg | Freq: Once | INTRAMUSCULAR | Status: AC
  Administered 2011-09-01: 60 mg via INTRAMUSCULAR
  Filled 2011-09-01: qty 2

## 2011-09-01 MED ORDER — TRAMADOL HCL 50 MG PO TABS: 50.0000 mg | ORAL_TABLET | Freq: Four times a day (QID) | ORAL | Status: AC | PRN
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
## 2011-09-01 NOTE — ED Notes (Signed)
Pt neck turned to L side with decreased mobility increased pain

## 2011-09-01 NOTE — ED Notes (Signed)
Care plan and safe use of meds reviewed pt ambulatory improved upon DC

## 2011-09-05 LAB — COMPREHENSIVE METABOLIC PANEL
ALT: 40
Alkaline Phosphatase: 47
BUN: 10
CO2: 30
GFR calc non Af Amer: >60
Glucose, Bld: 99
Potassium: 3.8
Sodium: 139
Total Protein: 6.2

## 2011-09-05 LAB — I-STAT 8, (EC8 V) (CONVERTED LAB)
BUN: 11
Bicarbonate: 25.4 — ABNORMAL HIGH
Chloride: 103
HCT: 46
Hemoglobin: 15.6
Operator id: 196461
Potassium: 3.9
Sodium: 138

## 2011-09-05 LAB — STOOL CULTURE

## 2011-09-05 LAB — CLOSTRIDIUM DIFFICILE EIA

## 2011-09-05 LAB — FECAL LACTOFERRIN, QUANT: Fecal Lactoferrin: NEGATIVE

## 2011-09-05 LAB — DIFFERENTIAL
Basophils Absolute: 0
Basophils Relative: 1
Eosinophils Absolute: 0.2
Neutro Abs: 3.4
Neutrophils Relative %: 50

## 2011-09-05 LAB — CBC
HCT: 41.9
Hemoglobin: 14.3
MCHC: 34
RBC: 4.95
RDW: 13.6

## 2011-09-05 LAB — RAPID URINE DRUG SCREEN, HOSP PERFORMED
Opiates: NOT DETECTED
Tetrahydrocannabinol: NOT DETECTED

## 2011-09-05 LAB — OVA AND PARASITE EXAMINATION: Ova and parasites: NONE SEEN

## 2011-09-05 LAB — ETHANOL: Alcohol, Ethyl (B): <5

## 2011-09-06 LAB — CULTURE, ROUTINE-ABSCESS

## 2011-11-04 ENCOUNTER — Emergency Department (INDEPENDENT_AMBULATORY_CARE_PROVIDER_SITE_OTHER): Payer: Self-pay

## 2011-11-04 ENCOUNTER — Emergency Department (HOSPITAL_BASED_OUTPATIENT_CLINIC_OR_DEPARTMENT_OTHER)
Admission: EM | Admit: 2011-11-04 | Discharge: 2011-11-04 | Disposition: A | Discharge status: Home / Self Care | Payer: Self-pay | Attending: Emergency Medicine | Admitting: Emergency Medicine

## 2011-11-04 ENCOUNTER — Encounter (HOSPITAL_BASED_OUTPATIENT_CLINIC_OR_DEPARTMENT_OTHER): Payer: Self-pay | Admitting: *Deleted

## 2011-11-04 DIAGNOSIS — R059 Cough, unspecified: Secondary | ICD-10-CM

## 2011-11-04 DIAGNOSIS — R05 Cough: Secondary | ICD-10-CM

## 2011-11-04 DIAGNOSIS — F319 Bipolar disorder, unspecified: Secondary | ICD-10-CM | POA: Insufficient documentation

## 2011-11-04 DIAGNOSIS — B9789 Other viral agents as the cause of diseases classified elsewhere: Secondary | ICD-10-CM | POA: Insufficient documentation

## 2011-11-04 DIAGNOSIS — K219 Gastro-esophageal reflux disease without esophagitis: Secondary | ICD-10-CM | POA: Insufficient documentation

## 2011-11-04 DIAGNOSIS — B349 Viral infection, unspecified: Secondary | ICD-10-CM

## 2011-11-04 DIAGNOSIS — R1110 Vomiting, unspecified: Secondary | ICD-10-CM

## 2011-11-04 DIAGNOSIS — R509 Fever, unspecified: Secondary | ICD-10-CM

## 2011-11-04 MED ORDER — ONDANSETRON 4 MG PO TBDP: 4.0000 mg | ORAL_TABLET | Freq: Three times a day (TID) | ORAL | Status: AC | PRN

## 2011-11-04 MED ORDER — ACETAMINOPHEN 325 MG PO TABS
650.0000 mg | ORAL_TABLET | Freq: Once | ORAL | Status: AC
  Administered 2011-11-04: 650 mg via ORAL
  Filled 2011-11-04: qty 2

## 2011-11-04 MED ORDER — SODIUM CHLORIDE 0.9 % IV BOLUS (SEPSIS)
1000.0000 mL | Freq: Once | INTRAVENOUS | Status: AC
  Administered 2011-11-04: 1000 mL via INTRAVENOUS

## 2011-11-04 MED ORDER — ONDANSETRON 8 MG PO TBDP
8.0000 mg | ORAL_TABLET | Freq: Once | ORAL | Status: AC
  Administered 2011-11-04: 8 mg via ORAL
  Filled 2011-11-04: qty 1

## 2011-11-04 NOTE — ED Notes (Signed)
Sleeping during most of visit.  Recently up to br,tolerated well.  States feels better and cooler.  Tolerating po flds

## 2011-11-04 NOTE — ED Notes (Signed)
Pt vomiting in obs room. Pt moved to room 5.

## 2011-11-04 NOTE — ED Notes (Signed)
Up to br  Tolerated well

## 2011-11-04 NOTE — ED Provider Notes (Signed)
History     CSN: [id number] Arrival date & time: 11/04/2011  5:11 PM   First MD Initiated Contact with Patient 11/04/11 1643      Chief Complaint  Patient presents with  . Fever    (Consider location/radiation/quality/duration/timing/severity/associated sxs/prior treatment) Patient is a 25 y.o. male presenting with fever. The history is provided by the patient. No language interpreter was used.  Fever Primary symptoms of the febrile illness include fever, cough, vomiting and myalgias. Primary symptoms do not include nausea. The current episode started today. This is a new problem. The problem has not changed since onset.   Past Medical History  Diagnosis Date  . Tobacco dipper   . GERD (gastroesophageal reflux disease)   . ADHD (attention deficit hyperactivity disorder)   . Hx MRSA infection 8/08    right thigh  . S/P colonoscopy 11/10, 10/08    Dr Elmer Ramp  . S/P endoscopy 10/08, 2/10, 8/10    Dr Darnelle Going reflux esophagitis, small HH  . Occult GI bleeding 5/10    normal SB GIVENS capsule study  . ADHD (attention deficit hyperactivity disorder)   . Bipolar 1 disorder     Past Surgical History  Procedure Date  . Vasectomy   . Ankle surgery     Family History  Problem Relation Age of Onset  . Leukemia Father 60  . Seizures Mother     History  Substance Use Topics  . Smoking status: Never Smoker   . Smokeless tobacco: Current User    Types: Chew  . Alcohol Use: Yes     2 six packs in a week      Review of Systems  Constitutional: Positive for fever.  Respiratory: Positive for cough.   Gastrointestinal: Positive for vomiting. Negative for nausea.  Musculoskeletal: Positive for myalgias.  All other systems reviewed and are negative.    Allergies  Ibuprofen  Home Medications   Current Outpatient Rx  Name Route Sig Dispense Refill  . ACETAMINOPHEN 500 MG PO TABS Oral Take 1,500 mg by mouth 2 (two) times daily as needed. For pain     .  CHLORPHENIRAMINE-PHENYLEPHRINE 4-10 MG PO TABS Oral Take 1 tablet by mouth every 4 (four) hours as needed. For congestion     . ESOMEPRAZOLE MAGNESIUM 40 MG PO CPDR Oral Take 40 mg by mouth daily before breakfast.      . ONDANSETRON 4 MG PO TBDP Oral Take 1 tablet (4 mg total) by mouth every 8 (eight) hours as needed for nausea. 10 tablet 0    BP 102/65  Pulse 95  Temp(Src) 98.5 F (36.9 C) (Oral)  Resp 19  SpO2 96%  Physical Exam  Nursing note and vitals reviewed. Constitutional: He is oriented to person, place, and time. He appears well-developed.  HENT:  Head: Normocephalic and atraumatic.  Right Ear: External ear normal.  Left Ear: External ear normal.  Eyes: Conjunctivae are normal. Pupils are equal, round, and reactive to light.  Cardiovascular: Normal rate and regular rhythm.   Pulmonary/Chest: Effort normal and breath sounds normal.  Abdominal: Soft. Bowel sounds are normal.  Musculoskeletal: Normal range of motion.  Neurological: He is alert and oriented to person, place, and time.  Skin: Skin is warm and dry.  Psychiatric: He has a normal mood and affect.    ED Course  Procedures (including critical care time)  Labs Reviewed - No data to display Dg Chest 2 View  11/04/2011  *RADIOLOGY REPORT*  Clinical Data: Cough with fever  and vomiting for past day.  CHEST - 2 VIEW  Comparison: None.  Findings: Heart size within normal limits.  Central pulmonary vascular prominence.  Minimal peribronchial thickening.  No infiltrate, congestive heart failure or pneumothorax.  IMPRESSION: No segmental consolidation.  Original Report Authenticated By: Fuller Canada, M.D.     1. Viral illness       MDM  Pt is tolerating po without any problem:pt is okay to go home         [name], NP 11/04/11 2057

## 2011-11-04 NOTE — ED Notes (Signed)
Fever, cough, chills, aching all over, feels sleepy.

## 2011-11-05 NOTE — ED Provider Notes (Signed)
History/physical exam/procedure(s) were performed by non-physician practitioner and as supervising physician I was immediately available for consultation/collaboration. I have reviewed all notes and am in agreement with care and plan. I personally saw and evaluated this 25 y.o. Male with fever, chills, nausea, and vomiting.    Hilario Quarry, MD 11/05/11 [phone]

## 2012-05-02 ENCOUNTER — Inpatient Hospital Stay (HOSPITAL_COMMUNITY)
Admission: EM | Admit: 2012-05-02 | Discharge: 2012-05-05 | DRG: 312 | Disposition: A | Discharge status: Home / Self Care | Payer: Medicaid Other | Source: Ambulatory Visit | Attending: Internal Medicine | Admitting: Internal Medicine

## 2012-05-02 DIAGNOSIS — G4733 Obstructive sleep apnea (adult) (pediatric): Secondary | ICD-10-CM | POA: Diagnosis present

## 2012-05-02 DIAGNOSIS — F319 Bipolar disorder, unspecified: Secondary | ICD-10-CM | POA: Diagnosis present

## 2012-05-02 DIAGNOSIS — G4730 Sleep apnea, unspecified: Secondary | ICD-10-CM

## 2012-05-02 DIAGNOSIS — Z806 Family history of leukemia: Secondary | ICD-10-CM

## 2012-05-02 DIAGNOSIS — E669 Obesity, unspecified: Secondary | ICD-10-CM

## 2012-05-02 DIAGNOSIS — F909 Attention-deficit hyperactivity disorder, unspecified type: Secondary | ICD-10-CM | POA: Diagnosis present

## 2012-05-02 DIAGNOSIS — R55 Syncope and collapse: Principal | ICD-10-CM

## 2012-05-02 DIAGNOSIS — F17200 Nicotine dependence, unspecified, uncomplicated: Secondary | ICD-10-CM | POA: Diagnosis present

## 2012-05-02 DIAGNOSIS — R079 Chest pain, unspecified: Secondary | ICD-10-CM

## 2012-05-02 DIAGNOSIS — K219 Gastro-esophageal reflux disease without esophagitis: Secondary | ICD-10-CM

## 2012-05-02 DIAGNOSIS — Z6841 Body Mass Index (BMI) 40.0 and over, adult: Secondary | ICD-10-CM

## 2012-05-02 HISTORY — DX: Abnormal results of thyroid function studies: R94.6

## 2012-05-02 HISTORY — DX: Obesity, unspecified: E66.9

## 2012-05-02 HISTORY — DX: Esophagitis, unspecified: K20.9

## 2012-05-02 HISTORY — DX: Esophagitis, unspecified without bleeding: K20.90

## 2012-05-02 NOTE — ED Provider Notes (Signed)
11:38 PM  Date: 05/02/2012  Rate: 93  Rhythm: normal sinus rhythm  QRS Axis: normal  Intervals: normal  ST/T Wave abnormalities: normal  Conduction Disutrbances:none  Narrative Interpretation:  Normal EKG.  Old EKG Reviewed: none available    Carleene Cooper III, MD 05/02/12 [phone]

## 2012-05-02 NOTE — ED Notes (Signed)
The patient states that he has a history of GERD.  He started feeling pain in his chest that he thought might be GERD exacerbation, so he considered "sleeping it off."  Minutes later the patient passed out on his bathroom floor.  His family said that he did not respond to verbal requests to stand up and/or communicate with the family.  The family reports that he was "passed out" for 4-6 minutes.  The patient was given nitro-stat 0.4 mg, sL x 2 which he said reduced his pain from an 8/10 to a 5/10.

## 2012-05-02 NOTE — ED Notes (Signed)
AIDET performed. 

## 2012-05-03 ENCOUNTER — Emergency Department (HOSPITAL_COMMUNITY): Payer: Medicaid Other

## 2012-05-03 ENCOUNTER — Inpatient Hospital Stay (HOSPITAL_COMMUNITY): Payer: Medicaid Other

## 2012-05-03 ENCOUNTER — Encounter (HOSPITAL_COMMUNITY): Payer: Self-pay | Admitting: Internal Medicine

## 2012-05-03 DIAGNOSIS — R55 Syncope and collapse: Secondary | ICD-10-CM

## 2012-05-03 DIAGNOSIS — R079 Chest pain, unspecified: Secondary | ICD-10-CM

## 2012-05-03 DIAGNOSIS — G4733 Obstructive sleep apnea (adult) (pediatric): Secondary | ICD-10-CM | POA: Diagnosis present

## 2012-05-03 DIAGNOSIS — G4730 Sleep apnea, unspecified: Secondary | ICD-10-CM

## 2012-05-03 DIAGNOSIS — Z6841 Body Mass Index (BMI) 40.0 and over, adult: Secondary | ICD-10-CM

## 2012-05-03 HISTORY — DX: Morbid (severe) obesity due to excess calories: E66.01

## 2012-05-03 HISTORY — DX: Body Mass Index (BMI) 40.0 and over, adult: Z684

## 2012-05-03 HISTORY — DX: Syncope and collapse: R55

## 2012-05-03 LAB — COMPREHENSIVE METABOLIC PANEL
ALT: 24 U/L (ref 0–53)
AST: 17 U/L (ref 0–37)
AST: 17 U/L (ref 0–37)
CO2: 23 mEq/L (ref 19–32)
CO2: 23 mEq/L (ref 19–32)
Calcium: 9.3 mg/dL (ref 8.4–10.5)
Chloride: 102 mEq/L (ref 96–112)
Chloride: 104 mEq/L (ref 96–112)
Creatinine, Ser: 0.89 mg/dL (ref 0.50–1.35)
Creatinine, Ser: 0.84 mg/dL (ref 0.50–1.35)
GFR calc Af Amer: >90 mL/min (ref >90)
GFR calc non Af Amer: >90 mL/min (ref >90)
GFR calc non Af Amer: >90 mL/min (ref >90)
Glucose, Bld: 91 mg/dL (ref 70–99)
Sodium: 137 mEq/L (ref 135–145)
Total Bilirubin: 0.3 mg/dL (ref 0.3–1.2)
Total Bilirubin: 0.4 mg/dL (ref 0.3–1.2)

## 2012-05-03 LAB — CBC
Hemoglobin: 14.4 g/dL (ref 13.0–17.0)
MCH: 27.5 pg (ref 26.0–34.0)
MCH: 28.3 pg (ref 26.0–34.0)
MCHC: 33.5 g/dL (ref 30.0–36.0)
Platelets: 257 10*3/uL (ref 150–400)
RBC: 5.09 MIL/uL (ref 4.22–5.81)
RDW: 15.1 % (ref 11.5–15.5)

## 2012-05-03 LAB — CARDIAC PANEL(CRET KIN+CKTOT+MB+TROPI)
CK, MB: 1.9 ng/mL (ref 0.3–4.0)
CK, MB: 1.9 ng/mL (ref 0.3–4.0)
Relative Index: INVALID (ref 0.0–2.5)
Relative Index: 1.9 (ref 0.0–2.5)
Total CK: 108 U/L (ref 7–232)
Total CK: 97 U/L (ref 7–232)
Total CK: 90 U/L (ref 7–232)
Total CK: 103 U/L (ref 7–232)
Troponin I: <0.3 ng/mL (ref <0.30)

## 2012-05-03 LAB — RAPID URINE DRUG SCREEN, HOSP PERFORMED
Cocaine: NOT DETECTED
Opiates: NOT DETECTED
Tetrahydrocannabinol: NOT DETECTED

## 2012-05-03 LAB — LIPASE, BLOOD: Lipase: 45 U/L (ref 11–59)

## 2012-05-03 LAB — HEMOGLOBIN A1C
Hgb A1c MFr Bld: 5.6 % (ref <5.7)
Mean Plasma Glucose: 114 mg/dL (ref <117)

## 2012-05-03 LAB — DIFFERENTIAL
Basophils Relative: 0 % (ref 0–1)
Eosinophils Absolute: 0.2 10*3/uL (ref 0.0–0.7)
Neutrophils Relative %: 50 % (ref 43–77)

## 2012-05-03 LAB — MRSA PCR SCREENING: MRSA by PCR: NEGATIVE

## 2012-05-03 LAB — TSH: TSH: 3.311 u[IU]/mL (ref 0.350–4.500)

## 2012-05-03 MED ORDER — IBUPROFEN 600 MG PO TABS
600.0000 mg | ORAL_TABLET | Freq: Three times a day (TID) | ORAL | Status: AC
  Administered 2012-05-03 (×3): 600 mg via ORAL
  Filled 2012-05-03 (×3): qty 1

## 2012-05-03 MED ORDER — PANTOPRAZOLE SODIUM 40 MG PO PACK
40.0000 mg | PACK | Freq: Every day | ORAL | Status: DC
  Filled 2012-05-03: qty 20

## 2012-05-03 MED ORDER — ALUM & MAG HYDROXIDE-SIMETH 200-200-20 MG/5ML PO SUSP
30.0000 mL | Freq: Four times a day (QID) | ORAL | Status: DC | PRN
  Filled 2012-05-03: qty 30

## 2012-05-03 MED ORDER — TRAMADOL HCL 50 MG PO TABS
50.0000 mg | ORAL_TABLET | Freq: Four times a day (QID) | ORAL | Status: DC | PRN
  Administered 2012-05-03: 50 mg via ORAL
  Filled 2012-05-03 (×2): qty 1

## 2012-05-03 MED ORDER — PANTOPRAZOLE SODIUM 40 MG PO TBEC: 40.0000 mg | DELAYED_RELEASE_TABLET | Freq: Every day | ORAL | Status: DC

## 2012-05-03 MED ORDER — DOCUSATE SODIUM 100 MG PO CAPS
100.0000 mg | ORAL_CAPSULE | Freq: Two times a day (BID) | ORAL | Status: DC
  Administered 2012-05-03 (×2): 100 mg via ORAL
  Filled 2012-05-03 (×6): qty 1

## 2012-05-03 MED ORDER — ACETAMINOPHEN 650 MG RE SUPP: 650.0000 mg | Freq: Four times a day (QID) | RECTAL | Status: DC | PRN

## 2012-05-03 MED ORDER — GI COCKTAIL ~~LOC~~
30.0000 mL | Freq: Three times a day (TID) | ORAL | Status: DC | PRN
  Administered 2012-05-03: 30 mL via ORAL
  Filled 2012-05-03 (×2): qty 30

## 2012-05-03 MED ORDER — ENOXAPARIN SODIUM 40 MG/0.4ML ~~LOC~~ SOLN
40.0000 mg | SUBCUTANEOUS | Status: DC
  Administered 2012-05-04: 40 mg via SUBCUTANEOUS
  Filled 2012-05-03 (×3): qty 0.4

## 2012-05-03 MED ORDER — ASPIRIN EC 81 MG PO TBEC
81.0000 mg | DELAYED_RELEASE_TABLET | Freq: Every day | ORAL | Status: DC
  Administered 2012-05-03 – 2012-05-05 (×3): 81 mg via ORAL
  Filled 2012-05-03 (×3): qty 1

## 2012-05-03 MED ORDER — GI COCKTAIL ~~LOC~~
ORAL | Status: AC
  Filled 2012-05-03: qty 30

## 2012-05-03 MED ORDER — GI COCKTAIL ~~LOC~~
30.0000 mL | Freq: Once | ORAL | Status: AC
  Administered 2012-05-03: 30 mL via ORAL
  Filled 2012-05-03: qty 30

## 2012-05-03 MED ORDER — SODIUM CHLORIDE 0.9 % IJ SOLN
3.0000 mL | Freq: Two times a day (BID) | INTRAMUSCULAR | Status: DC
  Administered 2012-05-03 – 2012-05-05 (×5): 3 mL via INTRAVENOUS

## 2012-05-03 MED ORDER — TRAMADOL HCL 50 MG PO TABS
50.0000 mg | ORAL_TABLET | Freq: Once | ORAL | Status: AC
  Administered 2012-05-03: 50 mg via ORAL

## 2012-05-03 MED ORDER — IOHEXOL 350 MG/ML SOLN
80.0000 mL | Freq: Once | INTRAVENOUS | Status: AC | PRN
  Administered 2012-05-03: 80 mL via INTRAVENOUS

## 2012-05-03 MED ORDER — GUAIFENESIN-DM 100-10 MG/5ML PO SYRP: 5.0000 mL | ORAL_SOLUTION | ORAL | Status: DC | PRN

## 2012-05-03 MED ORDER — ALBUTEROL SULFATE (5 MG/ML) 0.5% IN NEBU: 2.5000 mg | INHALATION_SOLUTION | RESPIRATORY_TRACT | Status: DC | PRN

## 2012-05-03 MED ORDER — ONDANSETRON HCL 4 MG PO TABS: 4.0000 mg | ORAL_TABLET | Freq: Four times a day (QID) | ORAL | Status: DC | PRN

## 2012-05-03 MED ORDER — GI COCKTAIL ~~LOC~~
30.0000 mL | Freq: Once | ORAL | Status: AC
  Administered 2012-05-03: 30 mL via ORAL

## 2012-05-03 MED ORDER — TRAMADOL HCL 50 MG PO TABS
100.0000 mg | ORAL_TABLET | Freq: Four times a day (QID) | ORAL | Status: DC | PRN
  Administered 2012-05-03 – 2012-05-05 (×5): 100 mg via ORAL
  Filled 2012-05-03 (×5): qty 2

## 2012-05-03 MED ORDER — ACETAMINOPHEN 325 MG PO TABS: 650.0000 mg | ORAL_TABLET | Freq: Four times a day (QID) | ORAL | Status: DC | PRN

## 2012-05-03 MED ORDER — ONDANSETRON HCL 4 MG/2ML IJ SOLN: 4.0000 mg | Freq: Four times a day (QID) | INTRAMUSCULAR | Status: DC | PRN

## 2012-05-03 MED ORDER — PANTOPRAZOLE SODIUM 40 MG PO TBEC
40.0000 mg | DELAYED_RELEASE_TABLET | Freq: Two times a day (BID) | ORAL | Status: DC
  Administered 2012-05-03 – 2012-05-05 (×4): 40 mg via ORAL
  Filled 2012-05-03 (×5): qty 1

## 2012-05-03 NOTE — H&P (Signed)
PCP:   Allie Dimmer, Inetta Fermo, OTR    Chief Complaint:   Syncope  HPI: [name] is a 26 y.o. male   has a past medical history of Tobacco dipper; GERD (gastroesophageal reflux disease); ADHD (attention deficit hyperactivity disorder); MRSA infection (8/08); S/P colonoscopy (11/10, 10/08); S/P endoscopy (10/08, 2/10, 8/10); Occult GI bleeding (5/10); ADHD (attention deficit hyperactivity disorder); and Bipolar 1 disorder.   Presented with  Episode of chest pain on the left side of the chest shooting sharp pains. Prior to this he was out fishing and a had a few beers. He was outside a lot during the day but felt fine. He attempted to take nexium but no relieve. Went to the bathroom and set on comode. Patient collupsed soon after. No Seizure like activity. Did not hit his head. LOS for about 4 min. And was confused when woke up but improved rapidly. He has history of apneic episodes at night and snoring. IN ED while sleeping desaturated down to 82% and needed o2. No family hx of early cardiac death or heart disease. His mother has epilepsy.   Review of Systems:    Pertinent positives include:  chest pain, leg swelling, shortness of breath.   Constitutional:  No weight loss, night sweats, Fevers, chills, fatigue, weight loss  HEENT:  No headaches, Difficulty swallowing,Tooth/dental problems,Sore throat,  No sneezing, itching, ear ache, nasal congestion, post nasal drip,  Cardio-vascular:  No  Orthopnea, PND, anasarca, dizziness, palpitations.no Bilateral lower extremity swelling  GI:  No heartburn, indigestion, abdominal pain, nausea, vomiting, diarrhea, change in bowel habits, loss of appetite, melena, blood in stool, hematemesis Resp:  no shortness of breath at rest. No dyspnea on exertion, No excess mucus, no productive cough, No non-productive cough, No coughing up of blood.No change in color of mucus.No wheezing. Skin:  no rash or lesions. No jaundice GU:  no dysuria,  change in color of urine, no urgency or frequency. No straining to urinate.  No flank pain.  Musculoskeletal:  No joint pain or no joint swelling. No decreased range of motion. No back pain.  Psych:  No change in mood or affect. No depression or anxiety. No memory loss.  Neuro: no localizing neurological complaints, no tingling, no weakness, no double vision, no gait abnormality, no slurred speech, no confusion  Otherwise ROS are negative except for above, 10 systems were reviewed  Past Medical History: Past Medical History  Diagnosis Date  . Tobacco dipper   . GERD (gastroesophageal reflux disease)   . ADHD (attention deficit hyperactivity disorder)   . Hx MRSA infection 8/08    right thigh  . S/P colonoscopy 11/10, 10/08    Dr Elmer Ramp  . S/P endoscopy 10/08, 2/10, 8/10    Dr Darnelle Going reflux esophagitis, small HH  . Occult GI bleeding 5/10    normal SB GIVENS capsule study  . ADHD (attention deficit hyperactivity disorder)   . Bipolar 1 disorder    Past Surgical History  Procedure Date  . Vasectomy   . Ankle surgery      Medications: Prior to Admission medications   Medication Sig Start Date End Date Taking? Authorizing Provider  Chlorpheniramine-Phenylephrine (ACTIFED COLD/ALLERGY) 4-10 MG per tablet Take 1 tablet by mouth every 4 (four) hours as needed. For congestion    Yes Historical Provider, MD  esomeprazole (NEXIUM) 40 MG capsule Take 40 mg by mouth daily before breakfast.     Yes Historical Provider, MD  ibuprofen (ADVIL,MOTRIN) 200 MG tablet Take  800 mg by mouth every 6 (six) hours as needed. Fore pain   Yes Historical Provider, MD    Allergies:   Allergies  Allergen Reactions  . Tylenol (Acetaminophen) Other (See Comments)    Bleeding ulcers    Social History:  Ambulatory  independently  Lives at  home   reports that he has never smoked. His smokeless tobacco use includes Chew. He reports that he drinks alcohol. He reports that he does not  use illicit drugs.   Family History: family history includes Leukemia (age of onset:51) in his father and Seizures in his mother.    Physical Exam: Patient Vitals for the past 24 hrs:  BP Temp Temp src Pulse Resp SpO2 Height Weight  05/03/12 0300 98/55 mmHg - - 76  20  99 % - -  05/03/12 0141 118/59 mmHg 97.8 F (36.6 C) Oral 96  21  98 % - -  05/02/12 2351 114/54 mmHg 97.4 F (36.3 C) Oral 97  30  97 % 6' (1.829 m) 149.687 kg (330 lb)  05/02/12 2344 - - - - - 96 % - -  05/02/12 2339 112/58 mmHg 97.7 F (36.5 C) Oral 97  20  98 % - -    1. General:  in No Acute distress 2. Psychological: Alert and  Oriented 3. Head/ENT:   Moist  Mucous Membranes                          Head Non traumatic, neck supple                          Normal  Dentition 4. SKIN: normal  Skin turgor,  Skin clean Dry and intact no rash 5. Heart: Regular rate and rhythm no Murmur, Rub or gallop 6. Lungs: Clear to auscultation bilaterally, no wheezes or crackles   7. Abdomen: Soft, non-tender, Non distended, obese 8. Lower extremities: no clubbing, cyanosis, or edema, obese 9. Neurologically Grossly intact, moving all 4 extremities equally 10. MSK: Normal range of motion  body mass index is 44.76 kg/(m^2).   Labs on Admission:   [facility] 05/03/12 0018  NA 137  K 3.6  CL 102  CO2 23  GLUCOSE 102*  BUN 13  CREATININE 0.89  CALCIUM 9.3  MG --  PHOS --    Basename 05/03/12 0018  AST 17  ALT 24  ALKPHOS 50  BILITOT 0.3  PROT 6.5  ALBUMIN 3.6    Basename 05/03/12 0018  LIPASE 45  AMYLASE --    Basename 05/03/12 0018  WBC 10.1  NEUTROABS 5.1  HGB 13.7  HCT 40.9  MCV 82.0  PLT 257    Basename 05/03/12 0018  CKTOTAL 108  CKMB 1.8  CKMBINDEX --  TROPONINI <0.30   No results found for this basename: TSH,T4TOTAL,FREET3,T3FREE,THYROIDAB in the last 72 hours No results found for this basename: VITAMINB12:2,FOLATE:2,FERRITIN:2,TIBC:2,IRON:2,RETICCTPCT:2 in the last 72 hours Lab  Results  Component Value Date   HGBA1C  Value: 5.9 (NOTE) The ADA recommends the following therapeutic goal for glycemic control related to Hgb A1c measurement: Goal of therapy: <6.5 Hgb A1c  Reference: American Diabetes Association: Clinical Practice Recommendations 2010, Diabetes Care, 2010, 33: (Suppl  1). 03/01/2010    Estimated Creatinine Clearance: 189.3 ml/min (by C-G formula based on Cr of 0.89). ABG    Component Value Date/Time   HCO3 25.4* 08/24/2007 2315   TCO2 28 01/05/2009 2310  No results found for this basename: DDIMER     Other results:  I have pearsonaly reviewed this: ECG REPORT  Rate: 93  Rhythm: sinus rhythm ST&T Change: no ischemic changes  Cultures:    Component Value Date/Time   SDES URINE, CATHETERIZED 03/02/2010 0226   SPECREQUEST unable to void vancomycin IMMUNE:NORM UT SYMPT:NEG 03/02/2010 0226   CULT NO GROWTH 03/02/2010 0226   REPTSTATUS 03/03/2010 FINAL 03/02/2010 0226       Radiological Exams on Admission: Dg Chest 2 View  05/03/2012  *RADIOLOGY REPORT*  Clinical Data: Chest pain, weakness  CHEST - 2 VIEW  Comparison: 11/04/2011  Findings: Heart size upper normal to mildly enlarged.  Central vascular congestion.  Minimal lung base opacities, likely atelectasis.  Otherwise, no focal consolidation, pleural effusion, or pneumothorax.  No acute osseous finding.  IMPRESSION: Heart size upper normal to mildly enlarged.  Minimal dependent atelectasis.  Original Report Authenticated By: Waneta Martins, M.D.   Ct Angio Chest W/cm &/or Wo Cm  05/03/2012  *RADIOLOGY REPORT*  Clinical Data: Chest pain, syncope  CT ANGIOGRAPHY CHEST  Technique:  Multidetector CT imaging of the chest using the standard protocol during bolus administration of intravenous contrast. Multiplanar reconstructed images including MIPs were obtained and reviewed to evaluate the vascular anatomy.  Contrast: 80mL OMNIPAQUE IOHEXOL 350 MG/ML SOLN  Comparison: Chest radiographs dated 05/03/2012.   Findings: No evidence of pulmonary embolism.  Mild dependent atelectasis in the bilateral lower lobes.  The lungs are otherwise clear.  No pleural effusion or pneumothorax.  Visualized thyroid is unremarkable.  Heart is top normal in size.  No pericardial effusion.  No suspicious mediastinal, hilar, or axillary lymphadenopathy.  Visualized upper abdomen is unremarkable.  Visualized osseous structures are within normal limits.  IMPRESSION: No evidence of pulmonary embolism.  No evidence of acute cardiopulmonary disease.  Original Report Authenticated By: Charline Bills, M.D.    Assessment/Plan  26 yo w chest pain and syncope  Present on Admission:  .Chest pain - somewhat atypical but given morbid obesity with evaluate for cardiac etiology, cycle cardiac enzymes. Obtain serial ECG, check TSH .Sleep apnea - he will likely need official testing and CPAP .Syncope and collapse - possibly cardiac etiology, monitor on tele, cycle CE, echo, carotid dopplers    Prophylaxis: Lovenox, Protonix  CODE STATUS: SULL CODE  Other plan as per orders.  I have spent a total of 55 min on this admission  [name] 05/03/2012, 4:10 AM

## 2012-05-03 NOTE — Progress Notes (Signed)
I have seen and assessed this patient and agree with Dr Celene Kras assessment and plan. Check CT head. Will consult with cardiology for further evaluation and rxcs.

## 2012-05-03 NOTE — Consult Note (Signed)
CARDIOLOGY CONSULT NOTE  Patient ID: RAMEL TOBON, MRN: [id number], DOB/AGE: May 08, 1986 26 y.o. Admit date: 05/02/2012   Date of Consult: 05/03/2012 Primary Physician: Dolores Hoose, OTR Primary Cardiologist: None  Chief Complaint: passed out, chest pain Reason for Consult: syncope, CP  HPI: 26 y/o M with no prior cardiac hx but hx of GERD/prior esophagitis, ADHD, obesity presented to [facility] with complaints of CP and episode of syncope. He was in his usual state of health yesterday and ate a steak and cheese sandwich then developed central "shooting" chest pain. He was out fishing all day prior outside and had a few beers as well. He took Nexium as it usually relieves his acid reflux pain which is similar but it didn't help. He decided to take a shower and was sitting on the commode preparing to undress. He was not actively using the toilet. The next thing he knew, he was on the floor. His wife and brother-in-law heard a thud and ran inside and he was reportedly unconscious for 4-6 minutes. His wife states he was stiff, but there were no jerking movements, bowel or bladder incontinence. When he woke up he was nonverbal but able to shake his head and improved rapidly by the time EMS came. Another nurse family member took his BP which was 160/113. He was flushed and hot when he came to. This has never happened before. Initially he denied any warning, but later stated he may have felt a sensation of heart pounding nearby the syncopal episode. He has felt the same palpitations since being in his hospital room, just less prominent. Since being in the hospital he has had frequent chest discomfort but no acute EKG changes. No SOB, LEE, orthopnea. He does snore with frequent apneic episodes at night per his wife.He also reports intermittent BRBPR upon wiping the last 3 days.  Labwork is unrevealing thus far - CMET, CBC WNL. CE's negative x 3 thus far. UDS negative. CT of head  nonacute. CT angio without PE. CXR - heart size upper normal to mildly enlarged.   Past Medical History  Diagnosis Date  . Tobacco dipper   . GERD (gastroesophageal reflux disease)   . ADHD (attention deficit hyperactivity disorder)   . Hx MRSA infection 8/08    right thigh  . S/P colonoscopy 11/10, 10/08    Dr Elmer Ramp  . S/P endoscopy 10/08, 2/10, 5/10    Dr Darnelle Going reflux esophagitis, small HH  . Occult GI bleeding 12/2008    Trivial upper GI bleed/uncontrolled GERD by upper endoscopy 12/2008, normal f/u endoscopy 03/2009 with SBCE at that time  . ADHD (attention deficit hyperactivity disorder)   . Bipolar 1 disorder   . Esophagitis     Distal esophageal erosions consistent with mild erosive  reflux esophagitis 12/2008 by EGD   . Thyroid function test abnormal     Noted in 2011 discharge  . Obesity     Nonsmoker. No family hx of CAD. No known DM, HTN, HL, CVA.  Most Recent Cardiac Studies: None   Surgical History:  Past Surgical History  Procedure Date  . Vasectomy   . Ankle surgery      Home Meds: Prior to Admission medications   Medication Sig Start Date End Date Taking? Authorizing Provider  Chlorpheniramine-Phenylephrine (ACTIFED COLD/ALLERGY) 4-10 MG per tablet Take 1 tablet by mouth every 4 (four) hours as needed. For congestion    Yes Historical Provider, MD  esomeprazole (NEXIUM) 40 MG capsule Take  40 mg by mouth daily before breakfast.     Yes Historical Provider, MD  ibuprofen (ADVIL,MOTRIN) 200 MG tablet Take 800 mg by mouth every 6 (six) hours as needed. Fore pain   Yes Historical Provider, MD    Inpatient Medications:    . aspirin EC  81 mg Oral Daily  . docusate sodium  100 mg Oral BID  . enoxaparin  40 mg Subcutaneous Q24H  . gi cocktail  30 mL Oral Once  . gi cocktail  30 mL Oral Once  . ibuprofen  600 mg Oral TID  . pantoprazole  40 mg Oral BID AC  . sodium chloride  3 mL Intravenous Q12H  . traMADol  50 mg Oral Once  . DISCONTD:  pantoprazole  40 mg Oral Q1200  . DISCONTD: pantoprazole sodium  40 mg Oral Daily    Allergies:  Allergies  Allergen Reactions  . Tylenol (Acetaminophen) Other (See Comments)    Bleeding ulcers    History   Social History  . Marital Status: Married    Spouse Name: N/A    Number of Children: 2  . Years of Education: N/A   Occupational History  . [employer]   . service tech    Social History Main Topics  . Smoking status: Never Smoker   . Smokeless tobacco: Current User    Types: Chew  . Alcohol Use: Yes     2 six packs in a week  . Drug Use: No  . Sexually Active: Not on file   Other Topics Concern  . Not on file   Social History Narrative  . No narrative on file     Family History  Problem Relation Age of Onset  . Leukemia Father 60  . Seizures Mother      Review of Systems: General: negative for chills, fever, night sweats or weight changes.  Cardiovascular: see above Dermatological: negative for rash Respiratory: negative for cough or wheezing Urologic: negative for hematuria Abdominal: see above. No nausea or vomiting MSK: frequent low back pain Neurologic: negative for visual changes, dizziness + syncope All other systems reviewed and are otherwise negative except as noted above.  Labs:  [facility] 05/03/12 1223 05/03/12 0650 05/03/12 0018  CKTOTAL 90 97 108  CKMB 1.9 1.9 1.8  TROPONINI <0.30 <0.30 <0.30   Lab Results  Component Value Date   WBC 8.4 05/03/2012   HGB 14.4 05/03/2012   HCT 42.3 05/03/2012   MCV 83.1 05/03/2012   PLT 253 05/03/2012    Lab 05/03/12 0650  NA 139  K 4.1  CL 104  CO2 23  BUN 12  CREATININE 0.84  CALCIUM 9.3  PROT 6.3  BILITOT 0.4  ALKPHOS 52  ALT 24  AST 17  GLUCOSE 91   Radiology/Studies:  1. Chest 2 View 05/03/2012  *RADIOLOGY REPORT*  Clinical Data: Chest pain, weakness  CHEST - 2 VIEW  Comparison: 11/04/2011  Findings: Heart size upper normal to mildly enlarged.  Central vascular congestion.  Minimal  lung base opacities, likely atelectasis.  Otherwise, no focal consolidation, pleural effusion, or pneumothorax.  No acute osseous finding.  IMPRESSION: Heart size upper normal to mildly enlarged.  Minimal dependent atelectasis.  Original Report Authenticated By: Waneta Martins, M.D.   2. Ct Head Wo Contrast 05/03/2012  *RADIOLOGY REPORT*  Clinical Data: Syncope  CT HEAD WITHOUT CONTRAST  Technique:  Contiguous axial images were obtained from the base of the skull through the vertex without contrast.  Comparison:  Head CT 07/08/2011  Findings: No acute intracranial abnormalities identified. Specifically, there is no hemorrhage, hydrocephalus, mass effect, mass lesion, or evidence of acute infarction.  The skull is intact.  The visualized paranasal sinuses and mastoid air cells are clear.  Soft tissues of the scalp are symmetric.  IMPRESSION: No acute intracranial abnormality.  Original Report Authenticated By: Britta Mccreedy, M.D.   3. Ct Angio Chest W/cm &/or Wo Cm 05/03/2012  *RADIOLOGY REPORT*  Clinical Data: Chest pain, syncope  CT ANGIOGRAPHY CHEST  Technique:  Multidetector CT imaging of the chest using the standard protocol during bolus administration of intravenous contrast. Multiplanar reconstructed images including MIPs were obtained and reviewed to evaluate the vascular anatomy.  Contrast: 80mL OMNIPAQUE IOHEXOL 350 MG/ML SOLN  Comparison: Chest radiographs dated 05/03/2012.  Findings: No evidence of pulmonary embolism.  Mild dependent atelectasis in the bilateral lower lobes.  The lungs are otherwise clear.  No pleural effusion or pneumothorax.  Visualized thyroid is unremarkable.  Heart is top normal in size.  No pericardial effusion.  No suspicious mediastinal, hilar, or axillary lymphadenopathy.  Visualized upper abdomen is unremarkable.  Visualized osseous structures are within normal limits.  IMPRESSION: No evidence of pulmonary embolism.  No evidence of acute cardiopulmonary disease.  Original  Report Authenticated By: Charline Bills, M.D.    EKG: NSR 93bpm nonspecific ST -T changes  Physical Exam: Blood pressure 117/72, pulse 84, temperature 97.6 F (36.4 C), temperature source Oral, resp. rate 18, height 6' (1.829 m), weight 330 lb (149.687 kg), SpO2 98.00%. General: Well developed morbidly obese WM in no acute distress. LAying flat in bed snoring when i initially entered the room Head: Normocephalic, atraumatic, sclera non-icteric, no xanthomas, nares are without discharge.  Neck: Negative for carotid bruits. JVD not elevated. Lungs: Clear bilaterally to auscultation without wheezes, rales, or rhonchi. Breathing is unlabored. Heart: RRR with S1 S2. No murmurs, rubs, or gallops appreciated. Abdomen: Soft, non-tender, non-distended, obese with normoactive bowel sounds. No hepatomegaly. No rebound/guarding. No obvious abdominal masses. Msk:  Strength and tone appear normal for age. Extremities: No clubbing or cyanosis. Tr sockline edema.  Distal pedal pulses are 2+ and equal bilaterally. Neuro: Alert and oriented X 3. Moves all extremities spontaneously. Psych:  Responds to questions appropriately with a normal affect.   Assessment and Plan:   1. Chest pain - sounds atypical, more GI in nature. EKG nonspecific changes, enzymes negative so far. No cardiac risk factors. With report of BRBPR (has had this in the past) and significant hx of esophagitis/GERD, would recommend GI eval. Avoid alcohol, NSAIDS. Would obtain 2D echo in light of syncope, possible borderline heart size on CXR. If abnormal, would proceed with exercise treadmill testing. 2. Syncope - unclear etiology, ddx includes seizure vs arrhythmia. CT angio/cardiac enzymes/CT head negative. No clear electrolyte abnormalities. Observe on telemetry, check 2D echo (hopefully windows will be okay). If syncope recurs, would consider outpatient event monitoring. 3. Possible palpitations - has had less pronounced version while on  telemetry which only shows NSR thus far. 4. Probable OSA - recommend outpatient sleep study. 5. Obesity - would benefit from nutrition counseling.  Signed, Dayna Dunn PA-C 05/03/2012, 3:16 PM  I have seen, examined the patient, and reviewed the above assessment and plan.  The patient is morbidly obese and has longstanding difficulties with GERD.  His chest pain is atypical and most likely GI in origin.  He reports that he is able to mow his grass without exertional difficulty.  He had syncope  while sitting on the commode in the setting of severe sharp chest pain.  This syncope is most consistent with vasovagal syncope related to his atypical chest pain.   Otherwise by history, he has daytime somnolence and fatigue.  He snores and does not feel well rested in the am. Recs as above. If echo is normal, proceed with outpatient GXT without imaging.   Outpatient sleep study is essential and should be arranged prior to discharge.   He has [facility] [employer] and therefore could likely have his sleep study at [facility].  Co Sign: Hillis Range, MD 05/03/2012 4:18 PM

## 2012-05-03 NOTE — Progress Notes (Signed)
05/03/2012 2:44 PM Nursing note Pt. C/o sharp, stabbing CP over sternum, worse on inspiration 8/10. EKG performed, no acute changes from prior. VSS. Denies SOB. PRN GI cocktail and tramadol given per orders. No full relief down to 4/10. Dr. Janee Morn paged and made aware. Orders received to repeat tramadol and Gi cocktail x 1. Orders enacted. Pt. Instructed to notify RN if no relief. Will continue to monitor closely.  [name], Blanchard Kelch

## 2012-05-03 NOTE — ED Notes (Signed)
Called to give report. RN to call back. 

## 2012-05-03 NOTE — ED Provider Notes (Signed)
History     CSN: [id number]  Arrival date & time 05/02/12  2322   First MD Initiated Contact with Patient 05/02/12 2340      Chief Complaint  Patient presents with  . Chest Pain  . Loss of Consciousness    (Consider location/radiation/quality/duration/timing/severity/associated sxs/prior treatment) HPI 26 year old male presents to emergency department after episode of chest pain and syncope. Patient reports he was sitting at the edge and table when he began to have left-sided sharp stabbing pain. Patient reports he has history of GERD but this did not feel like his normal GERD symptoms. Family given the Nexium. When the pain did not get better they helped him into the bathroom to take a bath. They heard a thud a few minutes later and found patient unconscious on the floor. Patient was unresponsive for 4-6 minutes. No head trauma reported. Patient reports he had a normal day, went fishing drank a few beers no more than his usual. Patient denies previous history of similar chest pain, no prolonged immobilization no leg swelling no other risk factors for DVT or PE. No prior history of syncope. He denies any known family history of coronary disease, PE DVT, or sudden cardiac death. Patient is obese, family reports history of present sleep apnea. Patient works as a IT sales professional.   Past Medical History  Diagnosis Date  . Tobacco dipper   . GERD (gastroesophageal reflux disease)   . ADHD (attention deficit hyperactivity disorder)   . Hx MRSA infection 8/08    right thigh  . S/P colonoscopy 11/10, 10/08    Dr Elmer Ramp  . S/P endoscopy 10/08, 2/10, 8/10    Dr Darnelle Going reflux esophagitis, small HH  . Occult GI bleeding 5/10    normal SB GIVENS capsule study  . ADHD (attention deficit hyperactivity disorder)   . Bipolar 1 disorder     Past Surgical History  Procedure Date  . Vasectomy   . Ankle surgery     Family History  Problem Relation Age of Onset  . Leukemia Father 64    . Seizures Mother     History  Substance Use Topics  . Smoking status: Never Smoker   . Smokeless tobacco: Current User    Types: Chew  . Alcohol Use: Yes     2 six packs in a week      Review of Systems  All other systems reviewed and are negative.    Allergies  Tylenol  Home Medications   Current Outpatient Rx  Name Route Sig Dispense Refill  . CHLORPHENIRAMINE-PHENYLEPHRINE 4-10 MG PO TABS Oral Take 1 tablet by mouth every 4 (four) hours as needed. For congestion     . ESOMEPRAZOLE MAGNESIUM 40 MG PO CPDR Oral Take 40 mg by mouth daily before breakfast.      . IBUPROFEN 200 MG PO TABS Oral Take 800 mg by mouth every 6 (six) hours as needed. Fore pain      BP 118/59  Pulse 96  Temp(Src) 97.8 F (36.6 C) (Oral)  Resp 21  Ht 6' (1.829 m)  Wt 330 lb (149.687 kg)  BMI 44.76 kg/m2  SpO2 98%  Physical Exam  Nursing note and vitals reviewed. Constitutional: He is oriented to person, place, and time. He appears well-developed and well-nourished.       Morbidly obese, sunburn noted to face, head  HENT:  Head: Normocephalic and atraumatic.  Right Ear: External ear normal.  Left Ear: External ear normal.  Nose: Nose normal.  Mouth/Throat:  Oropharynx is clear and moist.  Eyes: Conjunctivae and EOM are normal. Pupils are equal, round, and reactive to light.  Neck: Normal range of motion. Neck supple. No JVD present. No tracheal deviation present. No thyromegaly present.  Cardiovascular: Normal rate, regular rhythm, normal heart sounds and intact distal pulses.  Exam reveals no gallop and no friction rub.   No murmur heard. Pulmonary/Chest: Effort normal and breath sounds normal. No stridor. No respiratory distress. He has no wheezes. He has no rales. He exhibits no tenderness.  Abdominal: Soft. Bowel sounds are normal. He exhibits no distension and no mass. There is no tenderness. There is no rebound and no guarding.  Musculoskeletal: Normal range of motion. He  exhibits no edema and no tenderness.  Lymphadenopathy:    He has no cervical adenopathy.  Neurological: He is oriented to person, place, and time. He has normal reflexes. No cranial nerve deficit. He exhibits normal muscle tone. Coordination normal.  Skin: Skin is dry. No rash noted. No erythema. No pallor.  Psychiatric: He has a normal mood and affect. His behavior is normal. Judgment and thought content normal.    ED Course  Procedures (including critical care time)  Labs Reviewed  COMPREHENSIVE METABOLIC PANEL - Abnormal; Notable for the following:    Glucose, Bld 102 (*)    All other components within normal limits  CARDIAC PANEL(CRET KIN+CKTOT+MB+TROPI)  LIPASE, BLOOD  CBC  DIFFERENTIAL   Dg Chest 2 View  05/03/2012  *RADIOLOGY REPORT*  Clinical Data: Chest pain, weakness  CHEST - 2 VIEW  Comparison: 11/04/2011  Findings: Heart size upper normal to mildly enlarged.  Central vascular congestion.  Minimal lung base opacities, likely atelectasis.  Otherwise, no focal consolidation, pleural effusion, or pneumothorax.  No acute osseous finding.  IMPRESSION: Heart size upper normal to mildly enlarged.  Minimal dependent atelectasis.  Original Report Authenticated By: Waneta Martins, M.D.   Ct Angio Chest W/cm &/or Wo Cm  05/03/2012  *RADIOLOGY REPORT*  Clinical Data: Chest pain, syncope  CT ANGIOGRAPHY CHEST  Technique:  Multidetector CT imaging of the chest using the standard protocol during bolus administration of intravenous contrast. Multiplanar reconstructed images including MIPs were obtained and reviewed to evaluate the vascular anatomy.  Contrast: 80mL OMNIPAQUE IOHEXOL 350 MG/ML SOLN  Comparison: Chest radiographs dated 05/03/2012.  Findings: No evidence of pulmonary embolism.  Mild dependent atelectasis in the bilateral lower lobes.  The lungs are otherwise clear.  No pleural effusion or pneumothorax.  Visualized thyroid is unremarkable.  Heart is top normal in size.  No  pericardial effusion.  No suspicious mediastinal, hilar, or axillary lymphadenopathy.  Visualized upper abdomen is unremarkable.  Visualized osseous structures are within normal limits.  IMPRESSION: No evidence of pulmonary embolism.  No evidence of acute cardiopulmonary disease.  Original Report Authenticated By: Charline Bills, M.D.     1. Syncope   2. Chest pain       MDM  26 year old male with syncopal episode associated with chest pain. History concerning for PE. CT angiogram negative, EKG unremarkable first set of enzymes negative given history of syncope associated with chest pain wish for monitoring in the hospital. Will discuss with hospitalist. Of note, while patient in the emergency apartment he removed his oxygen and fell asleep. Patient noted to have oxygen saturations in the low 80s with good waveform with breathing pattern consistent with sleep apnea.        Olivia Mackie, MD 05/03/12 [phone]

## 2012-05-04 DIAGNOSIS — K219 Gastro-esophageal reflux disease without esophagitis: Secondary | ICD-10-CM

## 2012-05-04 DIAGNOSIS — G4730 Sleep apnea, unspecified: Secondary | ICD-10-CM

## 2012-05-04 DIAGNOSIS — R55 Syncope and collapse: Secondary | ICD-10-CM

## 2012-05-04 DIAGNOSIS — R079 Chest pain, unspecified: Secondary | ICD-10-CM

## 2012-05-04 LAB — BASIC METABOLIC PANEL
BUN: 10 mg/dL (ref 6–23)
CO2: 28 mEq/L (ref 19–32)
Chloride: 102 mEq/L (ref 96–112)
Creatinine, Ser: 0.92 mg/dL (ref 0.50–1.35)
GFR calc Af Amer: >90 mL/min (ref >90)
Potassium: 4.4 mEq/L (ref 3.5–5.1)

## 2012-05-04 LAB — CBC
HCT: 42.8 % (ref 39.0–52.0)
Hemoglobin: 14.1 g/dL (ref 13.0–17.0)
MCHC: 32.9 g/dL (ref 30.0–36.0)
MCV: 83.6 fL (ref 78.0–100.0)
RDW: 15.2 % (ref 11.5–15.5)

## 2012-05-04 MED ORDER — IBUPROFEN 600 MG PO TABS
600.0000 mg | ORAL_TABLET | Freq: Three times a day (TID) | ORAL | Status: AC
  Administered 2012-05-04 – 2012-05-05 (×3): 600 mg via ORAL
  Filled 2012-05-04 (×3): qty 1

## 2012-05-04 NOTE — Progress Notes (Signed)
Subjective: No further syncopal episodes. Patient states CP easing off.  Objective: Vital signs in last 24 hours: Filed Vitals:   05/04/12 0009 05/04/12 0413 05/04/12 1017 05/04/12 1508  BP: 112/74 108/73 113/73 125/76  Pulse: 54 85 70 66  Temp: 97.5 F (36.4 C) 97.9 F (36.6 C) 97.6 F (36.4 C) 98.2 F (36.8 C)  TempSrc: Oral Oral Oral Oral  Resp: 18 18 18 20   Height:      Weight:      SpO2: 99% 98% 99% 99%    Intake/Output Summary (Last 24 hours) at 05/04/12 1741 Last data filed at 05/04/12 1230  Gross per 24 hour  Intake    720 ml  Output   1550 ml  Net   -830 ml    Weight change:   General: Alert, awake, oriented x3, in no acute distress. HEENT: No bruits, no goiter. Heart: Regular rate and rhythm, without murmurs, rubs, gallops. Lungs: Clear to auscultation bilaterally. Abdomen: Soft, nontender, nondistended, positive bowel sounds. Extremities: No clubbing cyanosis or edema with positive pedal pulses. Neuro: Grossly intact, nonfocal.   Lab Results:  [facility] 05/04/12 0645 05/03/12 0650  NA 138 139  K 4.4 4.1  CL 102 104  CO2 28 23  GLUCOSE 90 91  BUN 10 12  CREATININE 0.92 0.84  CALCIUM 9.3 9.3  MG 2.1 2.2  PHOS -- 4.3    Basename 05/03/12 0650 05/03/12 0018  AST 17 17  ALT 24 24  ALKPHOS 52 50  BILITOT 0.4 0.3  PROT 6.3 6.5  ALBUMIN 3.6 3.6    Basename 05/03/12 0018  LIPASE 45  AMYLASE --    Basename 05/04/12 0645 05/03/12 0650 05/03/12 0018  WBC 8.6 8.4 --  NEUTROABS -- -- 5.1  HGB 14.1 14.4 --  HCT 42.8 42.3 --  MCV 83.6 83.1 --  PLT 218 253 --    Basename 05/03/12 1900 05/03/12 1223 05/03/12 0650  CKTOTAL 103 90 97  CKMB 2.0 1.9 1.9  CKMBINDEX -- -- --  TROPONINI <0.30 <0.30 <0.30   No components found with this basename: POCBNP:3 No results found for this basename: DDIMER:2 in the last 72 hours  Basename 05/03/12 0650  HGBA1C 5.6   No results found for this basename: CHOL:2,HDL:2,LDLCALC:2,TRIG:2,CHOLHDL:2,LDLDIRECT:2  in the last 72 hours  Basename 05/03/12 0650  TSH 3.311  T4TOTAL --  T3FREE --  THYROIDAB --   No results found for this basename: VITAMINB12:2,FOLATE:2,FERRITIN:2,TIBC:2,IRON:2,RETICCTPCT:2 in the last 72 hours  Micro Results: Recent Results (from the past 240 hour(s))  MRSA PCR SCREENING     Status: Normal   Collection Time   05/03/12  5:13 AM      Component Value Range Status Comment   MRSA by PCR NEGATIVE  NEGATIVE  Final     Studies/Results: Dg Chest 2 View  05/03/2012  *RADIOLOGY REPORT*  Clinical Data: Chest pain, weakness  CHEST - 2 VIEW  Comparison: 11/04/2011  Findings: Heart size upper normal to mildly enlarged.  Central vascular congestion.  Minimal lung base opacities, likely atelectasis.  Otherwise, no focal consolidation, pleural effusion, or pneumothorax.  No acute osseous finding.  IMPRESSION: Heart size upper normal to mildly enlarged.  Minimal dependent atelectasis.  Original Report Authenticated By: Waneta Martins, M.D.   Ct Head Wo Contrast  05/03/2012  *RADIOLOGY REPORT*  Clinical Data: Syncope  CT HEAD WITHOUT CONTRAST  Technique:  Contiguous axial images were obtained from the base of the skull through the vertex without contrast.  Comparison: Head  CT 07/08/2011  Findings: No acute intracranial abnormalities identified. Specifically, there is no hemorrhage, hydrocephalus, mass effect, mass lesion, or evidence of acute infarction.  The skull is intact.  The visualized paranasal sinuses and mastoid air cells are clear.  Soft tissues of the scalp are symmetric.  IMPRESSION: No acute intracranial abnormality.  Original Report Authenticated By: Britta Mccreedy, M.D.   Ct Angio Chest W/cm &/or Wo Cm  05/03/2012  *RADIOLOGY REPORT*  Clinical Data: Chest pain, syncope  CT ANGIOGRAPHY CHEST  Technique:  Multidetector CT imaging of the chest using the standard protocol during bolus administration of intravenous contrast. Multiplanar reconstructed images including MIPs were  obtained and reviewed to evaluate the vascular anatomy.  Contrast: 80mL OMNIPAQUE IOHEXOL 350 MG/ML SOLN  Comparison: Chest radiographs dated 05/03/2012.  Findings: No evidence of pulmonary embolism.  Mild dependent atelectasis in the bilateral lower lobes.  The lungs are otherwise clear.  No pleural effusion or pneumothorax.  Visualized thyroid is unremarkable.  Heart is top normal in size.  No pericardial effusion.  No suspicious mediastinal, hilar, or axillary lymphadenopathy.  Visualized upper abdomen is unremarkable.  Visualized osseous structures are within normal limits.  IMPRESSION: No evidence of pulmonary embolism.  No evidence of acute cardiopulmonary disease.  Original Report Authenticated By: Charline Bills, M.D.    Medications:     . aspirin EC  81 mg Oral Daily  . docusate sodium  100 mg Oral BID  . enoxaparin  40 mg Subcutaneous Q24H  . gi cocktail  30 mL Oral Once  . ibuprofen  600 mg Oral TID  . pantoprazole  40 mg Oral BID AC  . sodium chloride  3 mL Intravenous Q12H    Assessment: Principal Problem:  *Syncope and collapse Active Problems:  Sleep apnea  Chest pain  Obese   Plan: #1. Syncope and collapse Unknown etiology. Maybe vasovagal secondary to chest pain vs arrythmia. Cardiac enzymes negative x 3. 2 D echo pending. Per cardiology if 2-D echo is normal we'll need outpatient stress test for further evaluation and management. Monitor.  #2 chest pain Likely secondary to GI and musculoskeletal. Patient's chest pain was reproducible. Patient with some clinical improvement on, tramadol, PPI twice a day, GI cocktail, one day of scheduled ibuprofen. Will continue PPI twice daily, Tramadol, GI cocktail when necessary, another 24 hours of scheduled ibuprofen. Patient will need outpatient followup with his gastroenterologist.  #3 probable obstructive sleep apnea Patient will need to be set up for outpatient sleep study. Will check patient's saturations when sleeping and  if less than 88% will set patient for outpatient oxygen. He was noted in the ED however that while patient was sleeping his sats dropped to 82% however was not recorded anywhere. Follow.  #4 obesity  #5 prophylaxis PPI for GI prophylaxis, Lovenox for DVT prophylaxis.   LOS: 2 days   Gery Sabedra [phone] 05/04/2012, 5:41 PM

## 2012-05-04 NOTE — Progress Notes (Signed)
VASCULAR LAB PRELIMINARY  PRELIMINARY  PRELIMINARY  PRELIMINARY  Carotid duplex completed.    Preliminary report:  Bilateral:  No evidence of hemodynamically significant internal carotid artery stenosis.   Vertebral artery flow is antegrade.     [name] D, RVS6/08/2012, 2:50 PM

## 2012-05-04 NOTE — Clinical Social Work Psychosocial (Signed)
     Clinical Social Work Department BRIEF PSYCHOSOCIAL ASSESSMENT 05/04/2012  Patient:  [name], [name]     Account Number:  [id number]     Admit date:  05/02/2012  Clinical Social Worker:  Robin Searing  Date/Time:  05/04/2012 12:23 PM  Referred by:  Physician  Date Referred:  05/04/2012 Referred for  Psychosocial assessment   Other Referral:   Interview type:  Other - See comment Other interview type:   Spoke with patient and wife at bedside    PSYCHOSOCIAL DATA Living Status:  FAMILY Admitted from facility:   Level of care:   Primary support name:  wife Primary support relationship to patient:  FAMILY Degree of support available:   good    CURRENT CONCERNS Current Concerns  Other - See comment   Other Concerns:   Financial needs, food assistance, housing options, disability/medicaid    SOCIAL WORK ASSESSMENT / PLAN Per patient they have been living with their 2 children at his mother in law's  home  along with  the father in law, brother in law, his girlfirend and the girlfriend's best friend. The mother in law works and the girlfriend and her best friend work on the weekends. Per the patient and his wife the mother in laws income and the patients unemployment are the only source of income/support they have. The patient is applying for disability and hopeful for medicaid to kick in as he has had this in the past but currently his disability income is too high- The patient and his wife have 2 children- ages 25 and 64- one has autism and one has inner ear deafness. Both children are linked with community resources related to their impairment needs as well as [facility] (intensive in home therapy)   Assessment/plan status:  Other - See comment Other assessment/ plan:   Will provide patient with community resources to further aide in assisting him and his family.   Information/referral to community resources:   DSS, Psychologist, occupational, other TBD     PATIENTS/FAMILYS RESPONSE TO PLAN OF CARE: Patient and wife are appreciative and receptive to CSW and interventions. Wife is hopeful to get back in school and get a job once her 26 yo starts school.

## 2012-05-04 NOTE — Progress Notes (Signed)
Utilization Review Completed.  [name]  05/04/2012  

## 2012-05-04 NOTE — Care Management Note (Signed)
    Page 1 of 1   05/05/2012     10:51:21 AM   CARE MANAGEMENT NOTE 05/05/2012  Patient:  [name], [name]   Account Number:  [id number]  Date Initiated:  05/04/2012  Documentation initiated by:  Alvira Philips Assessment:   26 yr-old male adm with syncope/chest pain     Action/Plan:   Anticipated DC Date:  05/05/2012   Anticipated DC Plan:  HOME/SELF CARE  In-house referral  Financial Counselor      DC Planning Services  CM consult  Outpatient Services - Pt will follow up  [facility]      Choice offered to / List presented to:             Status of service:  Completed, signed off Medicare Important Message given?   (If response is "NO", the following Medicare IM given date fields will be blank) Date Medicare IM given:   Date Additional Medicare IM given:    Discharge Disposition:  HOME/SELF CARE  Per UR Regulation:  Reviewed for med. necessity/level of care/duration of stay  If discussed at Long Length of Stay Meetings, dates discussed:    Comments:  05/05/12  1048  [name] RN, BSN [phone] APPT SCHEDULED AT [facility] DEPT ON 05/12/12 AT 3PM FOR PCP F/U POST D/C; PROVIDED PT WITH INFORMATION AND WHAT HE NEEDS TO BRING TO THE APPT; [facility] WILL CONTACT PT FOR SLLEP STUDY.  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  [facility] to [facility] 2[facility]2 physician documentation of O2 sat of 82% when pt was sleeping in ED.  Received referral to arrange home O2 - will need documented sat of < 88% x 5 minutes during sleep - staff RN to provide documentation.

## 2012-05-04 NOTE — Progress Notes (Signed)
  Echocardiogram 2D Echocardiogram has been performed.  Jorje Guild [facility] 05/04/2012, 3:09 PM

## 2012-05-05 DIAGNOSIS — K219 Gastro-esophageal reflux disease without esophagitis: Secondary | ICD-10-CM

## 2012-05-05 DIAGNOSIS — R55 Syncope and collapse: Secondary | ICD-10-CM

## 2012-05-05 DIAGNOSIS — R079 Chest pain, unspecified: Secondary | ICD-10-CM

## 2012-05-05 DIAGNOSIS — G4730 Sleep apnea, unspecified: Secondary | ICD-10-CM

## 2012-05-05 LAB — BASIC METABOLIC PANEL
BUN: 12 mg/dL (ref 6–23)
CO2: 31 mEq/L (ref 19–32)
Calcium: 9.5 mg/dL (ref 8.4–10.5)
Glucose, Bld: 89 mg/dL (ref 70–99)
Potassium: 4.5 mEq/L (ref 3.5–5.1)
Sodium: 141 mEq/L (ref 135–145)

## 2012-05-05 MED ORDER — GI COCKTAIL ~~LOC~~: 30.0000 mL | Freq: Three times a day (TID) | ORAL | Status: DC | PRN

## 2012-05-05 MED ORDER — TRAMADOL HCL 50 MG PO TABS: 100.0000 mg | ORAL_TABLET | Freq: Four times a day (QID) | ORAL | Status: AC | PRN

## 2012-05-05 MED ORDER — PANTOPRAZOLE SODIUM 40 MG PO TBEC: 40.0000 mg | DELAYED_RELEASE_TABLET | Freq: Two times a day (BID) | ORAL | Status: DC

## 2012-05-05 MED ORDER — IBUPROFEN 600 MG PO TABS: 600.0000 mg | ORAL_TABLET | Freq: Four times a day (QID) | ORAL | Status: AC | PRN

## 2012-05-05 NOTE — Discharge Summary (Signed)
Discharge Summary  [name] MR#: [id number]  DOB:1986/10/22  Date of Admission: 05/02/2012 Date of Discharge: 05/05/2012  Patient's PCP: Dolores Hoose, OTR  Attending Physician:Saba Gomm  Consults: Treatment Team:  #1 cardiology: Dr. Johney Frame   Discharge Diagnoses: Syncope and collapse Present on Admission:  .Sleep apnea .Syncope and collapse .Chest pain .Obese .GERD   Brief Admitting History and Physical [name] is a 26 y.o. male  has a past medical history of Tobacco dipper; GERD (gastroesophageal reflux disease); ADHD (attention deficit hyperactivity disorder); MRSA infection (8/08); S/P colonoscopy (11/10, 10/08); S/P endoscopy (10/08, 2/10, 8/10); Occult GI bleeding (5/10); ADHD (attention deficit hyperactivity disorder); and Bipolar 1 disorder.  Presented with  Episode of chest pain on the left side of the chest shooting sharp pains. Prior to this he was out fishing and a had a few beers. He was outside a lot during the day but felt fine. He attempted to take nexium but no relieve. Went to the bathroom and set on comode. Patient collupsed soon after. No Seizure like activity. Did not hit his head. LOS for about 4 min. And was confused when woke up but improved rapidly. He has history of apneic episodes at night and snoring. IN ED while sleeping desaturated down to 82% and needed o2. No family hx of early cardiac death or heart disease. His mother has epilepsy.  Review of Systems:  Pertinent positives include: chest pain, leg swelling, shortness of breath.  For the rest of admission history and physical please see H&P dictated by Dr. Adela Glimpse.  Discharge Medications Medication List  As of 05/05/2012 11:31 AM   START taking these medications         gi cocktail Susp suspension   Take 30 mLs by mouth 3 (three) times daily as needed for indigestion. Shake well.      pantoprazole 40 MG tablet   Commonly known as: PROTONIX   Take 1 tablet  (40 mg total) by mouth 2 (two) times daily before a meal.      traMADol 50 MG tablet   Commonly known as: ULTRAM   Take 2 tablets (100 mg total) by mouth every 6 (six) hours as needed.         CHANGE how you take these medications         ibuprofen 600 MG tablet   Commonly known as: ADVIL,MOTRIN   Take 1 tablet (600 mg total) by mouth every 6 (six) hours as needed for pain. Take 1 tablet 3 times daily x 4 days then take as needed.   What changed: - medication strength - dose - reasons to take the med - doctor's instructions         CONTINUE taking these medications         ACTIFED COLD/ALLERGY 4-10 MG per tablet   Generic drug: Chlorpheniramine-Phenylephrine         STOP taking these medications         esomeprazole 40 MG capsule          Where to get your medications    These are the prescriptions that you need to pick up.   You may get these medications from any pharmacy.         gi cocktail Susp suspension   ibuprofen 600 MG tablet   pantoprazole 40 MG tablet   traMADol 50 MG tablet           Hospital Course: #1 Syncope and collapse Patient was admitted  with a syncopal episode. Cardiac enzymes were cycled which were negative x3. 2-D echo was obtained which showed a normal ejection fraction of 55-65% with no wall motion abnormalities. Carotid Dopplers were obtained which did not show any significant stenosis. CT scan of the head was also obtained which was negative. Patient was placed on telemetry and no arrhythmias were noted during this hospitalization. Secondary to patient's presentation it was felt that this might be cardiac etiology versus vasovagal. A cardiology consultation was obtained and patient was seen in consultation by Dr. Johney Frame. It was felt per cardiology that patient's syncope was consistent with vasovagal related to his atypical chest pain. It was felt that patient's echo was normal patient will benefit from outpatient stress test without imaging.  Patient will be scheduled for outpatient stress test without imaging probably about cardiology. Patient remained in stable condition during the hospitalization did not have any further syncopal episodes. Patient be discharged in stable and improved condition.  #2 chest pain Patient was admitted with chest pain. Patient's chest pain had atypical features in that it was reproducible and improved with a GI cocktail. Cardiac enzymes were cycled which were negative x3. 2-D echo was obtained which had a normal ejection fraction of 55-65% with no wall motion abnormalities. Patient was placed on Protonix 40 mg twice daily he was given a GI cocktail on as-needed basis he was placed on scheduled Motrin as well as tramadol on as-needed basis patient's chest pain improved during the hospitalization. It was felt that patient will need a outpatient GI followup for further evaluation and management. Patient be discharged in stable and improved condition.  #3 probable obstructive sleep apnea Per patient's wife patient does have significant snoring with some periods of apnea. On day of admission while in the ED while patient was sleeping he was noted per admitting M.D. that patient descended to about 82%. Nursing was instructed to check patient's oxygenation on room at while sleeping however this was never done. Patient will be scheduled for outpatient sleep study.  #4 gastroesophageal reflux disease Patient was maintained on PPI during the hospitalization.  Patient remained stable during the hospitalization were discharged home in stable and improved condition.  Present on Admission:  .Sleep apnea .Syncope and collapse .Chest pain .Obese .GERD   Day of Discharge BP 118/65  Pulse 54  Temp(Src) 97.9 F (36.6 C) (Oral)  Resp 22  Ht 6' (1.829 m)  Wt 149.687 kg (330 lb)  BMI 44.76 kg/m2  SpO2 97% Subjective: Patient states his chest pain is improving. No further syncopal episodes. General: Alert, awake,  oriented x3, in no acute distress. HEENT: No bruits, no goiter. Heart: Regular rate and rhythm, without murmurs, rubs, gallops. Lungs: Clear to auscultation bilaterally. Abdomen: Soft, nontender, nondistended, positive bowel sounds. Extremities: No clubbing cyanosis or edema with positive pedal pulses. Neuro: Grossly intact, nonfocal.   Results for orders placed during the hospital encounter of 05/02/12 (from the past 48 hour(s))  CARDIAC PANEL(CRET KIN+CKTOT+MB+TROPI)     Status: Normal   Collection Time   05/03/12 12:23 PM      Component Value Range Comment   Total CK 90  7 - 232 (U/L)    CK, MB 1.9  0.3 - 4.0 (ng/mL)    Troponin I <0.30  <0.30 (ng/mL)    Relative Index RELATIVE INDEX IS INVALID  0.0 - 2.5    URINE RAPID DRUG SCREEN (HOSP PERFORMED)     Status: Normal   Collection Time   05/03/12 12:56  PM      Component Value Range Comment   Opiates NONE DETECTED  NONE DETECTED     Cocaine NONE DETECTED  NONE DETECTED     Benzodiazepines NONE DETECTED  NONE DETECTED     Amphetamines NONE DETECTED  NONE DETECTED     Tetrahydrocannabinol NONE DETECTED  NONE DETECTED     Barbiturates NONE DETECTED  NONE DETECTED    CARDIAC PANEL(CRET KIN+CKTOT+MB+TROPI)     Status: Normal   Collection Time   05/03/12  7:00 PM      Component Value Range Comment   Total CK 103  7 - 232 (U/L)    CK, MB 2.0  0.3 - 4.0 (ng/mL)    Troponin I <0.30  <0.30 (ng/mL)    Relative Index 1.9  0.0 - 2.5    BASIC METABOLIC PANEL     Status: Normal   Collection Time   05/04/12  6:45 AM      Component Value Range Comment   Sodium 138  135 - 145 (mEq/L)    Potassium 4.4  3.5 - 5.1 (mEq/L)    Chloride 102  96 - 112 (mEq/L)    CO2 28  19 - 32 (mEq/L)    Glucose, Bld 90  70 - 99 (mg/dL)    BUN 10  6 - 23 (mg/dL)    Creatinine, Ser 1.61  0.50 - 1.35 (mg/dL)    Calcium 9.3  8.4 - 10.5 (mg/dL)    GFR calc non Af Amer >90  >90 (mL/min)    GFR calc Af Amer >90  >90 (mL/min)   CBC     Status: Normal   Collection Time     05/04/12  6:45 AM      Component Value Range Comment   WBC 8.6  4.0 - 10.5 (K/uL)    RBC 5.12  4.22 - 5.81 (MIL/uL)    Hemoglobin 14.1  13.0 - 17.0 (g/dL)    HCT 09.6  04.5 - 40.9 (%)    MCV 83.6  78.0 - 100.0 (fL)    MCH 27.5  26.0 - 34.0 (pg)    MCHC 32.9  30.0 - 36.0 (g/dL)    RDW 81.1  91.4 - 78.2 (%)    Platelets 218  150 - 400 (K/uL)   MAGNESIUM     Status: Normal   Collection Time   05/04/12  6:45 AM      Component Value Range Comment   Magnesium 2.1  1.5 - 2.5 (mg/dL)   BASIC METABOLIC PANEL     Status: Normal   Collection Time   05/05/12  5:42 AM      Component Value Range Comment   Sodium 141  135 - 145 (mEq/L)    Potassium 4.5  3.5 - 5.1 (mEq/L)    Chloride 103  96 - 112 (mEq/L)    CO2 31  19 - 32 (mEq/L)    Glucose, Bld 89  70 - 99 (mg/dL)    BUN 12  6 - 23 (mg/dL)    Creatinine, Ser 9.56  0.50 - 1.35 (mg/dL)    Calcium 9.5  8.4 - 10.5 (mg/dL)    GFR calc non Af Amer >90  >90 (mL/min)    GFR calc Af Amer >90  >90 (mL/min)     Dg Chest 2 View  05/03/2012  *RADIOLOGY REPORT*  Clinical Data: Chest pain, weakness  CHEST - 2 VIEW  Comparison: 11/04/2011  Findings: Heart size upper normal to mildly enlarged.  Central vascular congestion.  Minimal lung base opacities, likely atelectasis.  Otherwise, no focal consolidation, pleural effusion, or pneumothorax.  No acute osseous finding.  IMPRESSION: Heart size upper normal to mildly enlarged.  Minimal dependent atelectasis.  Original Report Authenticated By: Waneta Martins, M.D.   Ct Head Wo Contrast  05/03/2012  *RADIOLOGY REPORT*  Clinical Data: Syncope  CT HEAD WITHOUT CONTRAST  Technique:  Contiguous axial images were obtained from the base of the skull through the vertex without contrast.  Comparison: Head CT 07/08/2011  Findings: No acute intracranial abnormalities identified. Specifically, there is no hemorrhage, hydrocephalus, mass effect, mass lesion, or evidence of acute infarction.  The skull is intact.  The  visualized paranasal sinuses and mastoid air cells are clear.  Soft tissues of the scalp are symmetric.  IMPRESSION: No acute intracranial abnormality.  Original Report Authenticated By: Britta Mccreedy, M.D.   Ct Angio Chest W/cm &/or Wo Cm  05/03/2012  *RADIOLOGY REPORT*  Clinical Data: Chest pain, syncope  CT ANGIOGRAPHY CHEST  Technique:  Multidetector CT imaging of the chest using the standard protocol during bolus administration of intravenous contrast. Multiplanar reconstructed images including MIPs were obtained and reviewed to evaluate the vascular anatomy.  Contrast: 80mL OMNIPAQUE IOHEXOL 350 MG/ML SOLN  Comparison: Chest radiographs dated 05/03/2012.  Findings: No evidence of pulmonary embolism.  Mild dependent atelectasis in the bilateral lower lobes.  The lungs are otherwise clear.  No pleural effusion or pneumothorax.  Visualized thyroid is unremarkable.  Heart is top normal in size.  No pericardial effusion.  No suspicious mediastinal, hilar, or axillary lymphadenopathy.  Visualized upper abdomen is unremarkable.  Visualized osseous structures are within normal limits.  IMPRESSION: No evidence of pulmonary embolism.  No evidence of acute cardiopulmonary disease.  Original Report Authenticated By: Charline Bills, M.D.     Disposition: Home  Diet: Gen.  Activity: Increase activity slowly    Follow-up Appts: Discharge Orders    Future Orders Please Complete By Expires   Diet general      Increase activity slowly      Discharge instructions      Comments:   Follow up with [facility] as scheduled. Follow up with GI, DR Rourk in 1-2 weeks  Patient will be called for sleep study. Patient will be called for stress test with [facility]      TESTS THAT NEED FOLLOW-UP   Time spent on discharge, talking to the patient, and coordinating care: 55 mins.   SignedRamiro Harvest 05/05/2012, 11:31 AM

## 2012-05-21 ENCOUNTER — Ambulatory Visit (INDEPENDENT_AMBULATORY_CARE_PROVIDER_SITE_OTHER): Payer: Self-pay | Admitting: Nurse Practitioner

## 2012-05-21 ENCOUNTER — Encounter: Payer: Self-pay | Admitting: Nurse Practitioner

## 2012-05-21 DIAGNOSIS — R079 Chest pain, unspecified: Secondary | ICD-10-CM

## 2012-05-21 NOTE — Procedures (Signed)
3Exercise Treadmill Test  Pre-Exercise Testing Evaluation Rhythm: normal sinus  Rate: 67   PR:  78 QRS:  15  QT:  .35 QTc: .40     Test  Exercise Tolerance Test Ordering MD: Hillis Range, MD  Interpreting MD: Ward Givens , NP  Unique Test No: 1  Treadmill:  1  Indication for ETT: chest pain - rule out ischemia  Contraindication to ETT: No   Stress Modality: exercise - treadmill  Cardiac Imaging Performed: non   Protocol: standard Bruce - maximal  Max BP: 177/42  Max MPHR (bpm):  194 85% MPR (bpm):  165  MPHR obtained (bpm):  169 % MPHR obtained:  87%  Reached 85% MPHR (min:sec):  5:20 Total Exercise Time (min-sec):  6:00  Workload in METS:  7.2 Borg Scale: 13  Reason ETT Terminated:  left ankle pain - acute on chronic    ST Segment Analysis At Rest: normal ST segments - no evidence of significant ST depression With Exercise: no evidence of significant ST depression  Other Information Arrhythmia:  No Angina during ETT:  absent (0) Quality of ETT:  diagnostic  ETT Interpretation:  normal - no evidence of ischemia by ST analysis  Comments: No acute st/t changes.  No chest pain.  Test ended due to left ankle pain which is chronically worsened by activity.  Recommendations: Follow up prn.

## 2012-06-12 ENCOUNTER — Encounter: Payer: Self-pay | Admitting: Internal Medicine

## 2012-06-15 ENCOUNTER — Ambulatory Visit (INDEPENDENT_AMBULATORY_CARE_PROVIDER_SITE_OTHER): Payer: Medicaid Other | Admitting: Gastroenterology

## 2012-06-15 ENCOUNTER — Encounter: Payer: Self-pay | Admitting: Gastroenterology

## 2012-06-15 VITALS — BP 136/79 | HR 69 | Temp 97.8°F | Ht 72.0 in | Wt 369.2 lb

## 2012-06-15 DIAGNOSIS — R1909 Other intra-abdominal and pelvic swelling, mass and lump: Secondary | ICD-10-CM | POA: Insufficient documentation

## 2012-06-15 DIAGNOSIS — R109 Unspecified abdominal pain: Secondary | ICD-10-CM | POA: Insufficient documentation

## 2012-06-15 NOTE — Patient Instructions (Signed)
  CT of the abdomen and pelvis with contrast. If CT is unremarkable, we will start Levsin sublingual when necessary abdominal cramps. I will discuss with Dr. Jena Gauss regarding question of repeat colonoscopy although I do not feel that this would be recommended at this time. Continue protonix twice a day for at least 3 months, then consider once daily dosing if symptoms remain under control.

## 2012-06-15 NOTE — Progress Notes (Signed)
Primary Care Physician: Allie Dimmer, OTR  Primary Gastroenterologist:  Roetta Sessions, MD   Chief Complaint  Patient presents with  . Follow-up    from ER    HPI: [name] is a 26 y.o. male here for further evaluation of abdominal pain. Last seen in 05/2011 for persistent intermittent rectal bleeding s/p extensive w/u in past as detailed in [facility]. Bleeding thought to be due to fissure.  Upper abdominal pain/chest pain with recent admission for atypical chest pain. Cardiac workup negative. Previously on Nexium but did not take every day. Now on protonix bid.   Lower abdominal pain, crampy for two months. Unrelated to bowels. No dysuria. Not affected by meals. BM regular, diarrhea once per month. Used to have more frequently. No recent blood per rectum. Not nocturnal. Last for 10-15 minutes. Can be severe at times. No weight loss. No nausea or vomiting. Patient lays down when he has pain.  Past Surgical History  Procedure Date  . Vasectomy   . Ankle surgery   . Small bowel capsule 04/11/2009     normal throughout  . Esophagogastroduodenoscopy  04/07/2009    Normal esophagus, small hiatal hernia  . Esophagogastroduodenoscopy 01/09/2009    Distal esophageal erosions consistent with mild erosive reflux esophagitis, otherwise normal esophagus, small hiatal herniaotherwise normal stomach, D1-D2   . Ileocolonoscopy 08/26/2007     A normal rectum, colon, and terminal ileum  . Esophagogastroduodenoscopy 08/26/2007    Normal esophagus, a small hiatal/hernia, otherwise normal stomach D1 through D3  . Colonoscopy 10/10/2009    anal papilla otherwise normal     Current Outpatient Prescriptions  Medication Sig Dispense Refill  . FLUoxetine (PROZAC) 10 MG capsule Take 10 mg by mouth daily.      . pantoprazole (PROTONIX) 40 MG tablet Take 1 tablet (40 mg total) by mouth 2 (two) times daily before a meal.  62 tablet  0  . rifaximin (XIFAXAN) 200 MG tablet Take 200 mg by mouth 3  (three) times daily.        Allergies as of 06/15/2012 - Review Complete 06/15/2012  Allergen Reaction Noted  . Tylenol (acetaminophen) Other (See Comments) 05/02/2012    ROS:  General: Negative for anorexia, weight loss, fever, chills, fatigue, weakness. ENT: Negative for hoarseness, difficulty swallowing , nasal congestion. CV: Negative for chest pain, angina, palpitations, dyspnea on exertion, peripheral edema.  Respiratory: Negative for dyspnea at rest, dyspnea on exertion, cough, sputum, wheezing.  GI: See history of present illness. GU:  Negative for dysuria, hematuria, urinary incontinence, urinary frequency, nocturnal urination.  Endo: Negative for unusual weight change.    Physical Examination:   BP 136/79  Pulse 69  Temp 97.8 F (36.6 C) (Temporal)  Ht 6' (1.829 m)  Wt 369 lb 3.2 oz (167.468 kg)  BMI 50.07 kg/m2  General: Well-nourished, well-developed in no acute distress. Obese. Eyes: No icterus. Mouth: Oropharyngeal mucosa moist and pink , no lesions erythema or exudate. Lungs: Clear to auscultation bilaterally.  Heart: Regular rate and rhythm, no murmurs rubs or gallops.  Abdomen: Bowel sounds are normal, nontender,just left of midline around the umbilicus is thickness possibly of the abdominal muscles, discrete mass not identified but different from right side. Some mild tenderness associated with palpation. nondistended, no hepatosplenomegaly, no abdominal bruits or hernia , no rebound or guarding.   Extremities: No lower extremity edema. No clubbing or deformities. Neuro: Alert and oriented x 4   Skin: Warm and dry, no jaundice.   Psych: Alert  and cooperative, normal mood and affect.  Labs:  Lab Results  Component Value Date   WBC 8.6 05/04/2012   HGB 14.1 05/04/2012   HCT 42.8 05/04/2012   MCV 83.6 05/04/2012   PLT 218 05/04/2012   Lab Results  Component Value Date   CREATININE 1.06 05/05/2012   BUN 12 05/05/2012   NA 141 05/05/2012   K 4.5 05/05/2012    CL 103 05/05/2012   CO2 31 05/05/2012   Lab Results  Component Value Date   ALT 24 05/03/2012   AST 17 05/03/2012   ALKPHOS 52 05/03/2012   BILITOT 0.4 05/03/2012   Lab Results  Component Value Date   TSH 3.311 05/03/2012    Imaging Studies: No results found.

## 2012-06-15 NOTE — Progress Notes (Signed)
Faxed to PCP, Dr Bevelyn Buckles

## 2012-06-15 NOTE — Assessment & Plan Note (Signed)
Left sided abdominal mass just left of midline around the umbilicus possibly involving them abdominal muscle. No obvious hernia. Patient complains of abdominal cramping which is intermittent and daily. Occurring for 2 months. Patient states he was told while in the hospital that he may need another colonoscopy however really no indication for that at this time. I told patient I would discuss this with Dr. Jena Gauss however. In the meantime however, need to repeat CT abdomen pelvis with contrast to further evaluate abdominal exam findings.  CT of the abdomen and pelvis with contrast. If CT is unremarkable, we will start Levsin sublingual when necessary abdominal cramps. I will discuss with Dr. Jena Gauss regarding question of repeat colonoscopy although I do not feel that this would be recommended at this time. Continue protonix twice a day for at least 3 months, then consider once daily dosing if symptoms remain under control.

## 2012-06-19 ENCOUNTER — Ambulatory Visit (HOSPITAL_COMMUNITY)
Admission: RE | Admit: 2012-06-19 | Discharge: 2012-06-19 | Disposition: A | Discharge status: Home / Self Care | Payer: Medicaid Other | Source: Ambulatory Visit | Attending: Gastroenterology | Admitting: Gastroenterology

## 2012-06-19 ENCOUNTER — Encounter (HOSPITAL_COMMUNITY): Payer: Self-pay

## 2012-06-19 DIAGNOSIS — R1909 Other intra-abdominal and pelvic swelling, mass and lump: Secondary | ICD-10-CM

## 2012-06-19 DIAGNOSIS — R109 Unspecified abdominal pain: Secondary | ICD-10-CM | POA: Insufficient documentation

## 2012-06-19 MED ORDER — IOHEXOL 300 MG/ML  SOLN
100.0000 mL | Freq: Once | INTRAMUSCULAR | Status: AC | PRN
  Administered 2012-06-19: 100 mL via INTRAVENOUS

## 2012-06-19 NOTE — Progress Notes (Signed)
Quick Note:  CT negative.  Call in Levsin SL take one SL up to four times daily as needed for abd cramps. #120. 1refill. I will discuss question patient had about another TCS with [facility]. ______

## 2012-06-23 NOTE — Progress Notes (Signed)
Quick Note:  [facility] He is requesting that we call his cell number because other number listed is his mothers number. ______

## 2012-06-30 ENCOUNTER — Institutional Professional Consult (permissible substitution): Payer: Self-pay | Admitting: Internal Medicine

## 2012-07-14 ENCOUNTER — Encounter: Payer: Self-pay | Admitting: Internal Medicine

## 2012-07-14 ENCOUNTER — Ambulatory Visit (INDEPENDENT_AMBULATORY_CARE_PROVIDER_SITE_OTHER): Payer: Medicaid Other | Admitting: Internal Medicine

## 2012-07-14 VITALS — BP 140/82 | HR 108 | Ht 72.0 in | Wt 379.8 lb

## 2012-07-14 DIAGNOSIS — G4733 Obstructive sleep apnea (adult) (pediatric): Secondary | ICD-10-CM

## 2012-07-14 NOTE — Patient Instructions (Addendum)
Order- schedule split protocol NPSG    Dx OSA 

## 2012-07-14 NOTE — Progress Notes (Signed)
07/14/12- 33 yoM nonsmoker referred courtesy of Dr Johney Frame for evaluation of sleep complaints.  He has a hx of loud snoring and daytime somnolence, with no ENT surgery. A recent ER visit for chest pain ruled out cardiac. While there he was witnessed having apneic episodes. Wife notes loud snoring. He admits history of easy daytime sleepiness if sits quietly. Was treated with Ritalin in the past but not in recent years. Denies hypnagogic hallucinations, sleep paralysis cataplexy by my description.   bedtime between 10 and 11 PM, sleep latency one hour, wakes 3 times, and is rolling over, before up at 8 AM. History of bipolar disorder, ADHD but no cardiopulmonary disease and no ENT surgery. 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  Prior to Admission medications   Medication Sig Start Date End Date Taking? Authorizing Provider  FLUoxetine (PROZAC) 10 MG capsule Take 10 mg by mouth daily.   Yes Historical Provider, MD  hyoscyamine (LEVSIN, ANASPAZ) 0.125 MG tablet Take 0.125 mg by mouth every 6 (six) hours as needed.   Yes Historical Provider, MD  OLANZapine (ZYPREXA) 10 MG tablet Take 10 mg by mouth at bedtime.   Yes Historical Provider, MD  pantoprazole (PROTONIX) 40 MG tablet Take 1 tablet (40 mg total) by mouth 2 (two) times daily before a meal. 05/05/12 05/05/13 Yes Rodolph Bong, MD   Past Medical History  Diagnosis Date  . Tobacco dipper   . GERD (gastroesophageal reflux disease)   . ADHD (attention deficit hyperactivity disorder)   . Hx MRSA infection 8/08    right thigh  . S/P colonoscopy 11/10, 10/08    Dr Elmer Ramp  . S/P endoscopy 10/08, 2/10, 5/10    Dr Darnelle Going reflux esophagitis, small HH  . Occult GI bleeding 12/2008    Trivial upper GI bleed/uncontrolled GERD by upper endoscopy 12/2008, normal f/u endoscopy 03/2009 with SBCE at that time  . Bipolar 1 disorder   . Esophagitis     Distal esophageal erosions consistent with mild  erosive  reflux esophagitis 12/2008 by EGD   . Thyroid function test abnormal     Noted in 2011 discharge  . Obesity    Past Surgical History  Procedure Date  . Vasectomy   . Ankle surgery   . Small bowel capsule 04/11/2009     normal throughout  . Esophagogastroduodenoscopy  04/07/2009    Normal esophagus, small hiatal hernia  . Esophagogastroduodenoscopy 01/09/2009    Distal esophageal erosions consistent with mild erosive reflux esophagitis, otherwise normal esophagus, small hiatal herniaotherwise normal stomach, D1-D2   . Ileocolonoscopy 08/26/2007     A normal rectum, colon, and terminal ileum  . Esophagogastroduodenoscopy 08/26/2007    Normal esophagus, a small hiatal/hernia, otherwise normal stomach D1 through D3  . Colonoscopy 10/10/2009    anal papilla otherwise normal   Family History  Problem Relation Age of Onset  . Leukemia Father 69  . Seizures Mother   . Bipolar disorder Brother   . ADD / ADHD Brother    History   Social History  . Marital Status: Married    Spouse Name: N/A    Number of Children: 2  . Years of Education: N/A   Occupational History  . [employer]   . service tech    Social History Main Topics  . Smoking status: Never Smoker   . Smokeless tobacco: Current User    Types: Chew   Comment: DIPx 10 years  . Alcohol Use: Yes  2 six packs in a week. denies since 03/2012  . Drug Use: No  . Sexually Active: Not on file   Other Topics Concern  . Not on file   Social History Narrative  . No narrative on file   ROS-see HPI Constitutional:   No-   weight loss, night sweats, fevers, chills, +fatigue, lassitude. HEENT:   No-  headaches, difficulty swallowing, tooth/dental problems, +sore throat,       No-  sneezing, itching, ear ache, nasal congestion, post nasal drip,  CV:  No-  cardiac chest pain, orthopnea, PND, swelling in lower extremities, anasarca,  dizziness, palpitations Resp: + shortness of breath with exertion or at rest.               No-   productive cough,  No non-productive cough,  No- coughing up of blood.              No-   change in color of mucus.  No- wheezing.   Skin: No-   rash or lesions. GI:  + heartburn, indigestion, +abdominal pain, nausea, vomiting, diarrhea,                 change in bowel habits, loss of appetite GU: No-   dysuria, change in color of urine, no urgency or frequency.  No- flank pain. MS:  No-   joint pain or swelling.  No- decreased range of motion.  No- back pain. Neuro-     nothing unusual Psych:   See HPI-No- change in mood or affect. + depression or anxiety.  No memory loss.  OBJ- Physical Exam General- Alert, Oriented, Affect-appropriate, Distress- none acute, obese/ big Skin- rash-none, lesions- none, excoriation- none Lymphadenopathy- none Head- atraumatic            Eyes- Gross vision intact, PERRLA, conjunctivae and secretions clear            Ears- Hearing, canals-normal            Nose- Clear, no-Septal dev, mucus, polyps, erosion, perforation             Throat- Mallampati III , mucosa clear , drainage- none, tonsils- 2+ Neck- flexible , trachea midline, no stridor , thyroid nl, carotid no bruit Chest - symmetrical excursion , unlabored           Heart/CV- RRR , no murmur , no gallop  , no rub, nl s1 s2                           - JVD- none , edema- none, stasis changes- none, varices- none           Lung- clear to P&A, wheeze- none, cough- none , dullness-none, rub- none           Chest wall-  Abd- tender-no, distended-no, bowel sounds-present, HSM- no Br/ Gen/ Rectal- Not done, not indicated Extrem- cyanosis- none, clubbing, none, atrophy- none, strength- nl Neuro- grossly intact to observation

## 2012-07-15 ENCOUNTER — Emergency Department (HOSPITAL_COMMUNITY): Payer: Medicaid Other

## 2012-07-15 ENCOUNTER — Emergency Department (HOSPITAL_COMMUNITY)
Admission: EM | Admit: 2012-07-15 | Discharge: 2012-07-15 | Disposition: A | Discharge status: Home / Self Care | Payer: Medicaid Other | Attending: Emergency Medicine | Admitting: Emergency Medicine

## 2012-07-15 ENCOUNTER — Encounter (HOSPITAL_COMMUNITY): Payer: Self-pay | Admitting: *Deleted

## 2012-07-15 DIAGNOSIS — S8990XA Unspecified injury of unspecified lower leg, initial encounter: Secondary | ICD-10-CM | POA: Insufficient documentation

## 2012-07-15 DIAGNOSIS — X500XXA Overexertion from strenuous movement or load, initial encounter: Secondary | ICD-10-CM | POA: Insufficient documentation

## 2012-07-15 DIAGNOSIS — K219 Gastro-esophageal reflux disease without esophagitis: Secondary | ICD-10-CM | POA: Insufficient documentation

## 2012-07-15 DIAGNOSIS — F319 Bipolar disorder, unspecified: Secondary | ICD-10-CM | POA: Insufficient documentation

## 2012-07-15 DIAGNOSIS — S93409A Sprain of unspecified ligament of unspecified ankle, initial encounter: Secondary | ICD-10-CM

## 2012-07-15 DIAGNOSIS — S99919A Unspecified injury of unspecified ankle, initial encounter: Secondary | ICD-10-CM | POA: Insufficient documentation

## 2012-07-15 MED ORDER — NAPROXEN 500 MG PO TABS: 500.0000 mg | ORAL_TABLET | Freq: Two times a day (BID) | ORAL | Status: DC

## 2012-07-15 MED ORDER — TRAMADOL HCL 50 MG PO TABS: 50.0000 mg | ORAL_TABLET | Freq: Four times a day (QID) | ORAL | Status: AC | PRN

## 2012-07-15 NOTE — ED Notes (Signed)
Pt states twisted left ankle while weed eating today. Left ankle has noted swelling proximal to toes. Pt states has an ankle brace and crutches at home. No distress noted

## 2012-07-15 NOTE — ED Provider Notes (Signed)
History     CSN: [id number]  Arrival date & time 07/15/12  1557   First MD Initiated Contact with Patient 07/15/12 1615      Chief Complaint  Patient presents with  . Ankle Pain    (Consider location/radiation/quality/duration/timing/severity/associated sxs/prior treatment) HPI Comments: Patient c/o pain and swelling to left lateral ankle after he "twisted" his ankle.  Has hx of previous surgery to the same ankle several years ago.  He denies fall, numbness or proximal tenderness.   Patient is a 26 y.o. male presenting with ankle pain. The history is provided by the patient.  Ankle Pain  The incident occurred 12 to 24 hours ago. The incident occurred at home. The injury mechanism was torsion. The pain is present in the left ankle. The quality of the pain is described as aching and throbbing. The pain is moderate. The pain has been constant since onset. Pertinent negatives include no numbness, no inability to bear weight, no loss of motion, no muscle weakness, no loss of sensation and no tingling. He reports no foreign bodies present. The symptoms are aggravated by activity, bearing weight and palpation. He has tried nothing for the symptoms. The treatment provided no relief.    Past Medical History  Diagnosis Date  . Tobacco dipper   . GERD (gastroesophageal reflux disease)   . ADHD (attention deficit hyperactivity disorder)   . Hx MRSA infection 8/08    right thigh  . S/P colonoscopy 11/10, 10/08    Dr Elmer Ramp  . S/P endoscopy 10/08, 2/10, 5/10    Dr Darnelle Going reflux esophagitis, small HH  . Occult GI bleeding 12/2008    Trivial upper GI bleed/uncontrolled GERD by upper endoscopy 12/2008, normal f/u endoscopy 03/2009 with SBCE at that time  . Bipolar 1 disorder   . Esophagitis     Distal esophageal erosions consistent with mild erosive  reflux esophagitis 12/2008 by EGD   . Thyroid function test abnormal     Noted in 2011 discharge  . Obesity     Past Surgical  History  Procedure Date  . Vasectomy   . Ankle surgery   . Small bowel capsule 04/11/2009     normal throughout  . Esophagogastroduodenoscopy  04/07/2009    Normal esophagus, small hiatal hernia  . Esophagogastroduodenoscopy 01/09/2009    Distal esophageal erosions consistent with mild erosive reflux esophagitis, otherwise normal esophagus, small hiatal herniaotherwise normal stomach, D1-D2   . Ileocolonoscopy 08/26/2007     A normal rectum, colon, and terminal ileum  . Esophagogastroduodenoscopy 08/26/2007    Normal esophagus, a small hiatal/hernia, otherwise normal stomach D1 through D3  . Colonoscopy 10/10/2009    anal papilla otherwise normal    Family History  Problem Relation Age of Onset  . Leukemia Father 79  . Seizures Mother   . Bipolar disorder Brother   . ADD / ADHD Brother     History  Substance Use Topics  . Smoking status: Never Smoker   . Smokeless tobacco: Current User    Types: Chew   Comment: DIPx 10 years  . Alcohol Use: Yes     2 six packs in a week. denies since 03/2012      Review of Systems  Constitutional: Negative for fever and chills.  Genitourinary: Negative for dysuria and difficulty urinating.  Musculoskeletal: Positive for joint swelling and arthralgias.  Skin: Negative for color change and wound.  Neurological: Negative for tingling and numbness.  All other systems reviewed and are negative.  Allergies  Tylenol  Home Medications   Current Outpatient Rx  Name Route Sig Dispense Refill  . FLUOXETINE HCL 10 MG PO CAPS Oral Take 10 mg by mouth daily.    [name] Kitchen HYOSCYAMINE SULFATE 0.125 MG PO TABS Oral Take 0.125 mg by mouth every 6 (six) hours as needed.    [name] Kitchen OLANZAPINE 10 MG PO TABS Oral Take 10 mg by mouth at bedtime.    [name] Kitchen PANTOPRAZOLE SODIUM 40 MG PO TBEC Oral Take 1 tablet (40 mg total) by mouth 2 (two) times daily before a meal. 62 tablet 0    BP 140/88  Pulse 100  Temp 98.3 F (36.8 C) (Oral)  Resp 20  Ht 6' (1.829 m)   Wt 360 lb (163.295 kg)  BMI 48.82 kg/m2  SpO2 99%  Physical Exam  Nursing note and vitals reviewed. Constitutional: He is oriented to person, place, and time. He appears well-developed and well-nourished. No distress.  HENT:  Head: Normocephalic and atraumatic.  Cardiovascular: Normal rate, regular rhythm, normal heart sounds and intact distal pulses.   Pulmonary/Chest: Effort normal and breath sounds normal.  Musculoskeletal: He exhibits tenderness.       Left ankle: He exhibits swelling. He exhibits normal range of motion and no ecchymosis. tenderness. Lateral malleolus tenderness found. No posterior TFL, no head of 5th metatarsal and no proximal fibula tenderness found. Achilles tendon normal.       Feet:       ttp of the lateral left ankle.  ROM is preserved.  DP pulse is brisk, sensation intact.  No erythema, abrasion, bruising or deformity.    Neurological: He is alert and oriented to person, place, and time. He exhibits normal muscle tone. Coordination normal.  Skin: Skin is warm and dry.    ED Course  Procedures (including critical care time)  Labs Reviewed - No data to display Dg Ankle Complete Left  07/15/2012  *RADIOLOGY REPORT*  Clinical Data: Injured ankle today with pain  LEFT ANKLE COMPLETE - 3+ VIEW  Comparison: Left ankle films of 07/10/2009  Findings: There is abnormal contour of the medial malleolus possibly due to prior fracture with healing.  There is some degenerative spurring as result.  The ankle joint otherwise is normal and alignment is normal.  IMPRESSION: Degenerative change of the right ankle primarily along the medial malleolus.  No acute fracture.   Original Report Authenticated By: Juline Patch, M.D.      Pt has ankle brace and crutches at home from previous ankle injury.     MDM    Localized ttp of the lateral left ankle.  Mild STS, no deformity or proximal tenderness.  DP pulse is brisk, sensation intact.    Pt agrees to f/u with  orthopedics.  The patient appears reasonably screened and/or stabilized for discharge and I doubt any other medical condition or other [facility] requiring further screening, evaluation, or treatment in the ED at this time prior to discharge.   Prescribed:  Percocet #[id number] naprosyn      Mang Hazelrigg L. [place], [place] 07/15/12 1713

## 2012-07-15 NOTE — ED Notes (Signed)
Pt c/o pain in his left ankle after twisting it approximately 1 hour ago. Pt c/o swelling to his outer left ankle.

## 2012-07-15 NOTE — Discharge Instructions (Signed)
Ankle Sprain An ankle sprain is an injury to the strong, fibrous tissues (ligaments) that hold the bones of your ankle joint together.  CAUSES Ankle sprain usually is caused by a fall or by twisting your ankle. People who participate in sports are more prone to these types of injuries.  SYMPTOMS  Symptoms of ankle sprain include:  Pain in your ankle. The pain may be present at rest or only when you are trying to stand or walk.   Swelling.   Bruising. Bruising may develop immediately or within 1 to 2 days after your injury.   Difficulty standing or walking.  DIAGNOSIS  Your caregiver will ask you details about your injury and perform a physical exam of your ankle to determine if you have an ankle sprain. During the physical exam, your caregiver will press and squeeze specific areas of your foot and ankle. Your caregiver will try to move your ankle in certain ways. An X-ray exam may be done to be sure a bone was not broken or a ligament did not separate from one of the bones in your ankle (avulsion).  TREATMENT  Certain types of braces can help stabilize your ankle. Your caregiver can make a recommendation for this. Your caregiver may recommend the use of medication for pain. If your sprain is severe, your caregiver may refer you to a surgeon who helps to restore function to parts of your skeletal system (orthopedist) or a physical therapist. HOME CARE INSTRUCTIONS  Apply ice to your injury for 1 to 2 days or as directed by your caregiver. Applying ice helps to reduce inflammation and pain.  Put ice in a plastic bag.   Place a towel between your skin and the bag.   Leave the ice on for 15 to 20 minutes at a time, every 2 hours while you are awake.   Take over-the-counter or prescription medicines for pain, discomfort, or fever only as directed by your caregiver.   Keep your injured leg elevated, when possible, to lessen swelling.   If your caregiver recommends crutches, use them as  instructed. Gradually, put weight on the affected ankle. Continue to use crutches or a cane until you can walk without feeling pain in your ankle.   If you have a plaster splint, wear the splint as directed by your caregiver. Do not rest it on anything harder than a pillow the first 24 hours. Do not put weight on it. Do not get it wet. You may take it off to take a shower or bath.   You may have been given an elastic bandage to wear around your ankle to provide support. If the elastic bandage is too tight (you have numbness or tingling in your foot or your foot becomes cold and blue), adjust the bandage to make it comfortable.   If you have an air splint, you may blow more air into it or let air out to make it more comfortable. You may take your splint off at night and before taking a shower or bath.   Wiggle your toes in the splint several times per day if you are able.  SEEK MEDICAL CARE IF:   You have an increase in bruising, swelling, or pain.   Your toes feel cold.   Pain relief is not achieved with medication.  SEEK IMMEDIATE MEDICAL CARE IF: Your toes are numb or blue or you have severe pain. MAKE SURE YOU:   Understand these instructions.   Will watch your condition.     Will get help right away if you are not doing well or get worse.  Document Released: 11/11/2005 Document Revised: 10/31/2011 Document Reviewed: 06/15/2008 [employer] Patient Information 2012 [employer]. 

## 2012-07-16 NOTE — ED Provider Notes (Signed)
Medical screening examination/treatment/procedure(s) were performed by non-physician practitioner and as supervising physician I was immediately available for consultation/collaboration.  Deandra Goering, MD 07/16/12 1622 

## 2012-07-17 NOTE — Progress Notes (Signed)
Quick Note:  Pt aware, he said the medicine seems to be working ok. He will call if he has any problems ______

## 2012-07-17 NOTE — Progress Notes (Signed)
Quick Note:  Please let patient know. Per Dr. Jena Gauss, no indication for colonoscopy at this time.  If patient is still having abd cramps despite Levsin, then OV with RMR only per RMR. ______

## 2012-07-19 NOTE — Assessment & Plan Note (Addendum)
History and body habitus are strongly consistent with obstructive sleep apnea Plan-educated on good sleep hygiene, the basics of sleep apnea, the importance of weight and his responsibility to drive safely. We will schedule sleep study

## 2012-07-26 ENCOUNTER — Emergency Department (HOSPITAL_BASED_OUTPATIENT_CLINIC_OR_DEPARTMENT_OTHER)
Admission: EM | Admit: 2012-07-26 | Discharge: 2012-07-26 | Disposition: A | Discharge status: Home / Self Care | Payer: Medicaid Other | Attending: Emergency Medicine | Admitting: Emergency Medicine

## 2012-07-26 ENCOUNTER — Encounter (HOSPITAL_BASED_OUTPATIENT_CLINIC_OR_DEPARTMENT_OTHER): Payer: Self-pay | Admitting: *Deleted

## 2012-07-26 DIAGNOSIS — F319 Bipolar disorder, unspecified: Secondary | ICD-10-CM | POA: Insufficient documentation

## 2012-07-26 DIAGNOSIS — E669 Obesity, unspecified: Secondary | ICD-10-CM | POA: Insufficient documentation

## 2012-07-26 DIAGNOSIS — K219 Gastro-esophageal reflux disease without esophagitis: Secondary | ICD-10-CM | POA: Insufficient documentation

## 2012-07-26 DIAGNOSIS — J069 Acute upper respiratory infection, unspecified: Secondary | ICD-10-CM

## 2012-07-26 DIAGNOSIS — H9209 Otalgia, unspecified ear: Secondary | ICD-10-CM | POA: Insufficient documentation

## 2012-07-26 DIAGNOSIS — H6690 Otitis media, unspecified, unspecified ear: Secondary | ICD-10-CM

## 2012-07-26 DIAGNOSIS — Z8614 Personal history of Methicillin resistant Staphylococcus aureus infection: Secondary | ICD-10-CM | POA: Insufficient documentation

## 2012-07-26 DIAGNOSIS — F17200 Nicotine dependence, unspecified, uncomplicated: Secondary | ICD-10-CM | POA: Insufficient documentation

## 2012-07-26 MED ORDER — AMOXICILLIN 500 MG PO CAPS: 500.0000 mg | ORAL_CAPSULE | Freq: Three times a day (TID) | ORAL | Status: AC

## 2012-07-26 NOTE — ED Notes (Signed)
Pt c/o right ear pain x1 hour. 

## 2012-07-26 NOTE — ED Provider Notes (Signed)
History  This chart was scribed for Phillip Quarry, MD by Erskine Emery. This patient was seen in room MH04/MH04 and the patient's care was started at 22:36.   CSN: [id number]  Arrival date & time 07/26/12  2211   First MD Initiated Contact with Patient 07/26/12 2236      Chief Complaint  Patient presents with  . Otalgia    (Consider location/radiation/quality/duration/timing/severity/associated sxs/prior treatment) The history is provided by the patient. No language interpreter was used.  Phillip FITCHETT is a 26 y.o. male who presents to the Emergency Department complaining of sudden onset ear pain that "feels like my ear drum burst" as of one hours ago. Pt also reports a sinus infection with associated rhinorrhea and chills for the past 2-3 days. Pt denies any fever, nausea, emesis, sore throat, or h/o sinus problems. Pt reports a h/o stomach ulcers and he does take medication every day. Pt is not a smoker and he is allergic to Tylenol.  Dr. Tawny Hopping in [place] is the pt's PCP.   Past Medical History  Diagnosis Date  . Tobacco dipper   . GERD (gastroesophageal reflux disease)   . ADHD (attention deficit hyperactivity disorder)   . Hx MRSA infection 8/08    right thigh  . S/P colonoscopy 11/10, 10/08    Dr Elmer Ramp  . S/P endoscopy 10/08, 2/10, 5/10    Dr Darnelle Going reflux esophagitis, small HH  . Occult GI bleeding 12/2008    Trivial upper GI bleed/uncontrolled GERD by upper endoscopy 12/2008, normal f/u endoscopy 03/2009 with SBCE at that time  . Bipolar 1 disorder   . Esophagitis     Distal esophageal erosions consistent with mild erosive  reflux esophagitis 12/2008 by EGD   . Thyroid function test abnormal     Noted in 2011 discharge  . Obesity     Past Surgical History  Procedure Date  . Vasectomy   . Ankle surgery   . Small bowel capsule 04/11/2009     normal throughout  . Esophagogastroduodenoscopy  04/07/2009    Normal esophagus, small  hiatal hernia  . Esophagogastroduodenoscopy 01/09/2009    Distal esophageal erosions consistent with mild erosive reflux esophagitis, otherwise normal esophagus, small hiatal herniaotherwise normal stomach, D1-D2   . Ileocolonoscopy 08/26/2007     A normal rectum, colon, and terminal ileum  . Esophagogastroduodenoscopy 08/26/2007    Normal esophagus, a small hiatal/hernia, otherwise normal stomach D1 through D3  . Colonoscopy 10/10/2009    anal papilla otherwise normal    Family History  Problem Relation Age of Onset  . Leukemia Father 45  . Seizures Mother   . Bipolar disorder Brother   . ADD / ADHD Brother     History  Substance Use Topics  . Smoking status: Never Smoker   . Smokeless tobacco: Current User    Types: Chew   Comment: DIPx 10 years  . Alcohol Use: Yes     2 six packs in a week. denies since 03/2012      Review of Systems  Constitutional: Positive for chills. Negative for fever.  HENT: Positive for ear pain and rhinorrhea. Negative for sore throat.   Respiratory: Negative for cough and shortness of breath.   Gastrointestinal: Negative for nausea, vomiting and diarrhea.  Neurological: Negative for weakness.  All other systems reviewed and are negative.    Allergies  Tylenol  Home Medications   Current Outpatient Rx  Name Route Sig Dispense Refill  . FLUOXETINE HCL  10 MG PO CAPS Oral Take 10 mg by mouth daily.    [name] Kitchen HYOSCYAMINE SULFATE 0.125 MG PO TABS Oral Take 0.125 mg by mouth every 6 (six) hours as needed.    [name] Kitchen NAPROXEN 500 MG PO TABS Oral Take 1 tablet (500 mg total) by mouth 2 (two) times daily. 30 tablet 0  . OLANZAPINE 10 MG PO TABS Oral Take 10 mg by mouth at bedtime.    [name] Kitchen PANTOPRAZOLE SODIUM 40 MG PO TBEC Oral Take 1 tablet (40 mg total) by mouth 2 (two) times daily before a meal. 62 tablet 0  . TRAMADOL HCL 50 MG PO TABS Oral Take 1 tablet (50 mg total) by mouth every 6 (six) hours as needed for pain. 20 tablet 0    BP 137/85  Pulse 81   Temp 97.6 F (36.4 C) (Oral)  Resp 20  Ht 6' (1.829 m)  Wt 360 lb (163.295 kg)  BMI 48.82 kg/m2  SpO2 98%  Physical Exam  Nursing note and vitals reviewed. Constitutional: He is oriented to person, place, and time. He appears well-developed and well-nourished. No distress.  HENT:  Head: Normocephalic and atraumatic.       Ears look okay, with only mild erythema.  Eyes: EOM are normal. Pupils are equal, round, and reactive to light.  Neck: Neck supple. No tracheal deviation present.  Cardiovascular: Normal rate.   Pulmonary/Chest: Effort normal. No respiratory distress.  Abdominal: Soft. He exhibits no distension.  Musculoskeletal: Normal range of motion. He exhibits no edema.  Neurological: He is alert and oriented to person, place, and time.  Skin: Skin is warm and dry.  Psychiatric: He has a normal mood and affect.    ED Course  Procedures (including critical care time) DIAGNOSTIC STUDIES: Oxygen Saturation is 98% on room air, normal by my interpretation.    COORDINATION OF CARE: 22:43--I evaluated the patient and we discussed a treatment plan including nasal decongestants to which the pt agreed.    Labs Reviewed - No data to display No results found.   No diagnosis found.    MDM  I personally performed the services described in this documentation, which was scribed in my presence. The recorded information has been reviewed and considered.    Phillip Quarry, MD 07/26/12 2325

## 2012-08-03 ENCOUNTER — Ambulatory Visit (HOSPITAL_BASED_OUTPATIENT_CLINIC_OR_DEPARTMENT_OTHER): Payer: Medicaid Other | Attending: Internal Medicine | Admitting: Radiology

## 2012-08-03 VITALS — Ht 72.0 in | Wt 360.0 lb

## 2012-08-03 DIAGNOSIS — G4733 Obstructive sleep apnea (adult) (pediatric): Secondary | ICD-10-CM | POA: Insufficient documentation

## 2012-08-08 DIAGNOSIS — G4733 Obstructive sleep apnea (adult) (pediatric): Secondary | ICD-10-CM

## 2012-08-09 NOTE — Procedures (Signed)
[name], [name]            ACCOUNT NO.:  [id number]  MEDICAL RECORD NO.:  [id number]          PATIENT TYPE:  OUT  LOCATION:  SLEEP CENTER                 FACILITY:  [facility]  PHYSICIAN:  Lasheka Kempner D. Maple Hudson, MD, FCCP, FACPDATE OF BIRTH:  20-Apr-1986  DATE OF STUDY:  08/03/2012                           NOCTURNAL POLYSOMNOGRAM  REFERRING PHYSICIAN:  Mamye Bolds D. Lourdes Kucharski, MD, FCCP, FACP  INDICATION FOR STUDY:  Hypersomnia with sleep apnea.  EPWORTH SLEEPINESS SCORE:  12/24.  BMI of 49, weight 360 pounds, height 72 inches, neck 21 inches.  MEDICATIONS:  Home medications are charted and reviewed.  SLEEP ARCHITECTURE:  Total sleep time 393.5 minutes with sleep efficiency 96.8%.  Stage I was 5.1%, stage II 67.3%, stage III 7.9%, REM 19.7% of total sleep time.  Sleep latency 2.5 minutes, REM latency 183.5 minutes, awake after sleep onset 10.5 minutes, arousal index 13.6.  Bedtime Medication:  None.  RESPIRATORY DATA:  Apnea-hypopnea index (AHI) 24.1 per hour.  A total of 158 events was scored including 24 obstructive apneas, 1 central apnea, 1 mixed apnea, 132 hypopneas.  Events were not positional.  REM/AHI 75.9 per hour.  He did not have enough early events to qualify for application of split protocol, CPAP titration on this study night.  OXYGEN DATA:  Moderately loud snoring with oxygen desaturation to a nadir of 76% and mean oxygen saturation through the study of 94.1% on room air.  CARDIAC DATA:  Sinus rhythm.  MOVEMENT-PARASOMNIA:  No significant movement disturbance.  No bathroom trips.  There were intervals of apparent bruxism.  IMPRESSIONS-RECOMMENDATIONS: 1. Moderate obstructive sleep apnea/hypopnea syndrome, AHI 24.1 per     hour.  Moderately loud snoring with oxygen desaturation to a nadir     of 76% and mean oxygen saturation through the study of 94.1% on     room air. 2. He did not meet protocol criteria for initiation of split protocol,     CPAP titration during  the study night.     Consider return for dedicated CPAP titration study or evaluate for     alternative management as clinically appropriate.     Daniele Dillow D. Maple Hudson, MD, [facility], FACP Diplomate, American Board of Sleep Medicine    CDY/MEDQ  D:  08/08/2012 09:52:56  T:  08/09/2012 04:23:00  Job:  [id number]

## 2012-08-20 ENCOUNTER — Encounter: Payer: Self-pay | Admitting: Internal Medicine

## 2012-08-20 ENCOUNTER — Ambulatory Visit (INDEPENDENT_AMBULATORY_CARE_PROVIDER_SITE_OTHER): Payer: Medicaid Other | Admitting: Internal Medicine

## 2012-08-20 VITALS — BP 128/76 | HR 85 | Ht 72.0 in | Wt 376.6 lb

## 2012-08-20 DIAGNOSIS — Z23 Encounter for immunization: Secondary | ICD-10-CM

## 2012-08-20 DIAGNOSIS — G4733 Obstructive sleep apnea (adult) (pediatric): Secondary | ICD-10-CM

## 2012-08-20 NOTE — Patient Instructions (Addendum)
Order- PCP DME new CPAP, autotitrate 5-20 cwp x 7 days for pressure recommendation, mask of choice, humidifier dx OSA  Please call as needed  Flu vax

## 2012-08-20 NOTE — Progress Notes (Signed)
07/14/12- 39 yoM nonsmoker referred courtesy of Dr Johney Frame for evaluation of sleep complaints.  He has a hx of loud snoring and daytime somnolence, with no ENT surgery. A recent ER visit for chest pain ruled out cardiac. While there he was witnessed having apneic episodes. Wife notes loud snoring. He admits history of easy daytime sleepiness if sits quietly. Was treated with Ritalin in the past but not in recent years. Denies hypnagogic hallucinations, sleep paralysis cataplexy by my description.   bedtime between 10 and 11 PM, sleep latency one hour, wakes 3 times, and is rolling over, before up at 8 AM. History of bipolar disorder, ADHD but no cardiopulmonary disease and no ENT surgery. 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  08/20/12- 26 yoM nonsmoker followed for obstructive sleep apnea complicated by history of bipolar disorder, GERD, obesity He recognizes no changes since last visit. NPSG 08/03/12-  AHI 24.1/ hr, moderate OSA. Discussed treatment options  ROS-see HPI Constitutional:   No-   weight loss, night sweats, fevers, chills, +fatigue, lassitude. HEENT:   No-  headaches, difficulty swallowing, tooth/dental problems, +sore throat,       No-  sneezing, itching, ear ache, nasal congestion, post nasal drip,  CV:  No-  cardiac chest pain, orthopnea, PND, swelling in lower extremities, anasarca,  dizziness, palpitations Resp: + shortness of breath with exertion or at rest.              No-   productive cough,  No non-productive cough,  No- coughing up of blood.              No-   change in color of mucus.  No- wheezing.   Skin: No-   rash or lesions. GI:  + heartburn, indigestion, +abdominal pain, nausea, vomiting,  GU:  MS:  No-   joint pain or swelling.  Neuro-     nothing unusual Psych:   See HPI-No- change in mood or affect. + depression or anxiety.  No memory loss.  OBJ- Physical Exam General- Alert, Oriented, Affect-appropriate, Distress-  none acute, obese/ big Skin- rash-none, lesions- none, excoriation- none Lymphadenopathy- none Head- atraumatic            Eyes- Gross vision intact, PERRLA, conjunctivae and secretions clear            Ears- Hearing, canals-normal            Nose- Clear, no-Septal dev, mucus, polyps, erosion, perforation             Throat- Mallampati III , mucosa clear , drainage- none, tonsils- 2+ Neck- flexible , trachea midline, no stridor , thyroid nl, carotid no bruit Chest - symmetrical excursion , unlabored           Heart/CV- RRR , no murmur , no gallop  , no rub, nl s1 s2                           - JVD- none , edema- none, stasis changes- none, varices- none           Lung- clear to P&A, wheeze- none, cough- none , dullness-none, rub- none           Chest wall-  Abd-  Br/ Gen/ Rectal- Not done, not indicated Extrem- cyanosis- none, clubbing, none, atrophy- none, strength- nl Neuro- grossly intact to observation

## 2012-08-30 NOTE — Assessment & Plan Note (Signed)
We discussed his sleep study scores, physiology and available treatments for sleep apnea for medical concerns. We discussed importance of weight and his responsibility to drive safely. Plan-start with CPAP, auto titrate for pressure recommendation. Flu vaccine

## 2012-09-15 ENCOUNTER — Encounter: Payer: Self-pay | Admitting: Internal Medicine

## 2012-10-01 ENCOUNTER — Encounter: Payer: Self-pay | Admitting: Internal Medicine

## 2012-10-01 ENCOUNTER — Ambulatory Visit (INDEPENDENT_AMBULATORY_CARE_PROVIDER_SITE_OTHER): Payer: Medicaid Other | Admitting: Internal Medicine

## 2012-10-01 VITALS — BP 120/76 | HR 88 | Ht 72.0 in | Wt >= 6400 oz

## 2012-10-01 DIAGNOSIS — G4733 Obstructive sleep apnea (adult) (pediatric): Secondary | ICD-10-CM

## 2012-10-01 NOTE — Patient Instructions (Addendum)
Order- DME [facility]- change CPAP from autotitration to fixed pressure 17 cwp     Dx OSA

## 2012-10-01 NOTE — Progress Notes (Signed)
07/14/12- 54 yoM nonsmoker referred courtesy of Dr Johney Frame for evaluation of sleep complaints.  He has a hx of loud snoring and daytime somnolence, with no ENT surgery. A recent ER visit for chest pain ruled out cardiac. While there he was witnessed having apneic episodes. Wife notes loud snoring. He admits history of easy daytime sleepiness if sits quietly. Was treated with Ritalin in the past but not in recent years. Denies hypnagogic hallucinations, sleep paralysis cataplexy by my description.   bedtime between 10 and 11 PM, sleep latency one hour, wakes 3 times, and is rolling over, before up at 8 AM. History of bipolar disorder, ADHD but no cardiopulmonary disease and no ENT surgery. 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  08/20/12- 26 yoM nonsmoker followed for obstructive sleep apnea complicated by history of bipolar disorder, GERD, obesity He recognizes no changes since last visit. NPSG 08/03/12-  AHI 24.1/ hr, moderate OSA. Discussed treatment options  10/01/12- 26 yoM nonsmoker followed for obstructive sleep apnea complicated by history of bipolar disorder, GERD, obesity Review CPAP usage;pt wears CPAP every night for about 7 hours; still moves alot during sleep Had flu vax. Using CPAP 17/ [facility] all night, every night. Changed from auto to fixed pressure 17 with god control documented. Full face mask and humidifier.  ROS-see HPI Constitutional:   No-   weight loss, night sweats, fevers, chills, fatigue, lassitude. HEENT:   No-  headaches, difficulty swallowing, tooth/dental problems, +sore throat,       No-  sneezing, itching, ear ache, nasal congestion, post nasal drip,  CV:  No-  cardiac chest pain, orthopnea, PND, swelling in lower extremities, anasarca,  dizziness, palpitations Resp: + shortness of breath with exertion or at rest.              No-   productive cough,  No non-productive cough,  No- coughing up of blood.              No-    change in color of mucus.  No- wheezing.   Skin: No-   rash or lesions. GI:  + heartburn, indigestion, +abdominal pain, nausea, vomiting,  GU:  MS:  No-   joint pain or swelling.  Neuro-     nothing unusual Psych:   See HPI-No- change in mood or affect. + depression or anxiety.  No memory loss.  OBJ- Physical Exam General- Alert, Oriented, Affect-appropriate, Distress- none acute, obese/ big Skin- rash-none, lesions- none, excoriation- none Lymphadenopathy- none Head- atraumatic            Eyes- Gross vision intact, PERRLA, conjunctivae and secretions clear            Ears- Hearing, canals-normal            Nose- Clear, no-Septal dev, mucus, polyps, erosion, perforation             Throat- Mallampati III , mucosa clear , drainage- none, tonsils- 2+ Neck- flexible , trachea midline, no stridor , thyroid nl, carotid no bruit Chest - symmetrical excursion , unlabored           Heart/CV- RRR , no murmur , no gallop  , no rub, nl s1 s2                           - JVD- none , edema- none, stasis changes- none, varices- none  Lung- clear to P&A, wheeze- none, cough- none , dullness-none, rub- none           Chest wall-  Abd-  Br/ Gen/ Rectal- Not done, not indicated Extrem- cyanosis- none, clubbing, none, atrophy- none, strength- nl Neuro- grossly intact to observation

## 2012-10-06 NOTE — Assessment & Plan Note (Signed)
Good start on CPAP. Discussed comfort measures and expectations. Weight loss and safe driving remain his responsibilities.

## 2012-10-15 ENCOUNTER — Emergency Department (HOSPITAL_BASED_OUTPATIENT_CLINIC_OR_DEPARTMENT_OTHER): Payer: Medicaid Other

## 2012-10-15 ENCOUNTER — Encounter (HOSPITAL_BASED_OUTPATIENT_CLINIC_OR_DEPARTMENT_OTHER): Payer: Self-pay | Admitting: Emergency Medicine

## 2012-10-15 ENCOUNTER — Emergency Department (HOSPITAL_BASED_OUTPATIENT_CLINIC_OR_DEPARTMENT_OTHER)
Admission: EM | Admit: 2012-10-15 | Discharge: 2012-10-15 | Disposition: A | Discharge status: Home / Self Care | Payer: Medicaid Other | Attending: Emergency Medicine | Admitting: Emergency Medicine

## 2012-10-15 DIAGNOSIS — J3489 Other specified disorders of nose and nasal sinuses: Secondary | ICD-10-CM | POA: Insufficient documentation

## 2012-10-15 DIAGNOSIS — R509 Fever, unspecified: Secondary | ICD-10-CM | POA: Insufficient documentation

## 2012-10-15 DIAGNOSIS — R0602 Shortness of breath: Secondary | ICD-10-CM | POA: Insufficient documentation

## 2012-10-15 DIAGNOSIS — E669 Obesity, unspecified: Secondary | ICD-10-CM | POA: Insufficient documentation

## 2012-10-15 DIAGNOSIS — J329 Chronic sinusitis, unspecified: Secondary | ICD-10-CM | POA: Insufficient documentation

## 2012-10-15 DIAGNOSIS — F17200 Nicotine dependence, unspecified, uncomplicated: Secondary | ICD-10-CM | POA: Insufficient documentation

## 2012-10-15 DIAGNOSIS — F319 Bipolar disorder, unspecified: Secondary | ICD-10-CM | POA: Insufficient documentation

## 2012-10-15 DIAGNOSIS — Z8719 Personal history of other diseases of the digestive system: Secondary | ICD-10-CM | POA: Insufficient documentation

## 2012-10-15 DIAGNOSIS — E7800 Pure hypercholesterolemia, unspecified: Secondary | ICD-10-CM | POA: Insufficient documentation

## 2012-10-15 DIAGNOSIS — F909 Attention-deficit hyperactivity disorder, unspecified type: Secondary | ICD-10-CM | POA: Insufficient documentation

## 2012-10-15 DIAGNOSIS — IMO0001 Reserved for inherently not codable concepts without codable children: Secondary | ICD-10-CM | POA: Insufficient documentation

## 2012-10-15 DIAGNOSIS — Z79899 Other long term (current) drug therapy: Secondary | ICD-10-CM | POA: Insufficient documentation

## 2012-10-15 DIAGNOSIS — Z8614 Personal history of Methicillin resistant Staphylococcus aureus infection: Secondary | ICD-10-CM | POA: Insufficient documentation

## 2012-10-15 HISTORY — DX: Pure hypercholesterolemia, unspecified: E78.00

## 2012-10-15 HISTORY — DX: Gastric ulcer, unspecified as acute or chronic, without hemorrhage or perforation: K25.9

## 2012-10-15 MED ORDER — FLUTICASONE PROPIONATE 50 MCG/ACT NA SUSP: 2.0000 | Freq: Every day | NASAL | Status: DC

## 2012-10-15 NOTE — ED Provider Notes (Signed)
History     CSN: [id number]  Arrival date & time 10/15/12  4098   First MD Initiated Contact with Patient 10/15/12 0756      Chief Complaint  Patient presents with  . Cough  . Fever  . Generalized Body Aches    (Consider location/radiation/quality/duration/timing/severity/associated sxs/prior treatment) HPI Comments: Patient complains of 3 weeks of productive cough, body aches, chills, shortness of breath. He was diagnosed with pneumonia 1 week ago at urgent care and placed on Augmentin for 10 days. He has 3 days left. He's been taking it as prescribed. Denies any chest pain, abdominal pain, nausea or vomiting. Good by mouth intake and urine output. Has never had pneumonia before. Does not smoke.  The history is provided by the patient.    Past Medical History  Diagnosis Date  . Tobacco dipper   . GERD (gastroesophageal reflux disease)   . ADHD (attention deficit hyperactivity disorder)   . Hx MRSA infection 8/08    right thigh  . S/P colonoscopy 11/10, 10/08    Dr Elmer Ramp  . S/P endoscopy 10/08, 2/10, 5/10    Dr Darnelle Going reflux esophagitis, small HH  . Occult GI bleeding 12/2008    Trivial upper GI bleed/uncontrolled GERD by upper endoscopy 12/2008, normal f/u endoscopy 03/2009 with [facility] at that time  . Bipolar 1 disorder   . Esophagitis     Distal esophageal erosions consistent with mild erosive  reflux esophagitis 12/2008 by EGD   . Thyroid function test abnormal     Noted in 2011 discharge  . Obesity   . Gastric ulcer   . Hypercholesteremia     Past Surgical History  Procedure Date  . Vasectomy   . Ankle surgery   . Small bowel capsule 04/11/2009     normal throughout  . Esophagogastroduodenoscopy  04/07/2009    Normal esophagus, small hiatal hernia  . Esophagogastroduodenoscopy 01/09/2009    Distal esophageal erosions consistent with mild erosive reflux esophagitis, otherwise normal esophagus, small hiatal herniaotherwise normal stomach, D1-D2   .  Ileocolonoscopy 08/26/2007     A normal rectum, colon, and terminal ileum  . Esophagogastroduodenoscopy 08/26/2007    Normal esophagus, a small hiatal/hernia, otherwise normal stomach D1 through D3  . Colonoscopy 10/10/2009    anal papilla otherwise normal    Family History  Problem Relation Age of Onset  . Leukemia Father 65  . Seizures Mother   . Bipolar disorder Brother   . ADD / ADHD Brother     History  Substance Use Topics  . Smoking status: Never Smoker   . Smokeless tobacco: Current User    Types: Chew     Comment: DIPx 10 years  . Alcohol Use: Yes     Comment: 2 six packs in a week. denies since 03/2012      Review of Systems  Constitutional: Positive for fever, activity change and appetite change.  HENT: Positive for congestion and rhinorrhea.   Respiratory: Positive for cough.   Cardiovascular: Negative for chest pain.  Gastrointestinal: Negative for nausea, vomiting and abdominal pain.  Genitourinary: Negative for dysuria.  Musculoskeletal: Positive for myalgias and arthralgias.  Skin: Negative for rash.  Neurological: Negative for dizziness, weakness and headaches.    Allergies  Tylenol  Home Medications   Current Outpatient Rx  Name  Route  Sig  Dispense  Refill  . AMOXICILLIN-POT CLAVULANATE 875-125 MG PO TABS   Oral   Take 1 tablet by mouth 2 (two) times daily.         [name] Kitchen  FLUOXETINE HCL 10 MG PO CAPS   Oral   Take 40 mg by mouth daily.          [name] Kitchen HYOSCYAMINE SULFATE 0.125 MG PO TABS   Oral   Take 0.25 mg by mouth every 6 (six) hours as needed.          [name] Kitchen OLANZAPINE 10 MG PO TABS   Oral   Take 10 mg by mouth at bedtime.         [name] Kitchen PANTOPRAZOLE SODIUM 40 MG PO TBEC   Oral   Take 1 tablet (40 mg total) by mouth 2 (two) times daily before a meal.   62 tablet   0   . UNKNOWN TO PATIENT      Cholesterol lowering medication           BP 142/77  Pulse 68  Temp 97.5 F (36.4 C) (Oral)  Resp 20  Ht 6' (1.829 m)  Wt 370 lb  (167.831 kg)  BMI 50.18 kg/m2  SpO2 98%  Physical Exam  Constitutional: He is oriented to person, place, and time. He appears well-developed and well-nourished. No distress.  HENT:  Head: Normocephalic and atraumatic.  Right Ear: External ear normal.  Left Ear: External ear normal.  Mouth/Throat: Oropharynx is clear and moist. No oropharyngeal exudate.       TTP frontal and maxillary sinsuses Edematous turbinates.  Eyes: Conjunctivae normal and EOM are normal. Pupils are equal, round, and reactive to light.  Neck: Normal range of motion. Neck supple.       No meningismus  Cardiovascular: Normal rate, regular rhythm and normal heart sounds.   No murmur heard. Pulmonary/Chest: Effort normal and breath sounds normal. No respiratory distress.  Abdominal: Soft. There is no tenderness. There is no rebound and no guarding.  Musculoskeletal: Normal range of motion. He exhibits no edema and no tenderness.  Neurological: He is alert and oriented to person, place, and time. No cranial nerve deficit. He exhibits normal muscle tone. Coordination normal.  Skin: Skin is warm.    ED Course  Procedures (including critical care time)  Labs Reviewed - No data to display Dg Chest 2 View  10/15/2012  *RADIOLOGY REPORT*  Clinical Data: 26 year old male with cough congestion fever.  CHEST - 2 VIEW  Comparison: 05/03/2012 and earlier.  Findings: Stable lung volumes.  Cardiac size and mediastinal contours are within normal limits.  Visualized tracheal air column is within normal limits.  Lung parenchyma is stable and clear.  No pneumothorax or effusion. No acute osseous abnormality identified.  IMPRESSION: No acute cardiopulmonary abnormality.   Original Report Authenticated By: Erskine Speed, M.D.      No diagnosis found.    MDM  Cough, fever, SOB x 3 weeks, on augmentin for "pneumonia". Vitals stable. No distress. Lungs clear.  X-ray negative, lungs clear. Patient already on Augmentin. No hypoxia  or increased work of breathing. Probable sinusitis. Continue antibiotics.  Add decongestants and nasal steroid.   Date: 10/15/2012  Rate: 70  Rhythm: normal sinus rhythm  QRS Axis: normal  Intervals: normal  ST/T Wave abnormalities: normal  Conduction Disutrbances:none  Narrative Interpretation:   Old EKG Reviewed: unchanged     Glynn Octave, MD 10/15/12 [phone]

## 2012-10-15 NOTE — ED Notes (Signed)
Pt states he was diagnosed with pneumonia one week ago.  Pt placed on Augmentin but pt states he is not getting better.  Pt c/o fever, productive cough, green sputum, generalized body aches and sob with coughing.  Family states that pt is coughing so severely that he loses his breath and turns "blue".

## 2012-11-06 ENCOUNTER — Emergency Department (HOSPITAL_BASED_OUTPATIENT_CLINIC_OR_DEPARTMENT_OTHER): Payer: Medicaid Other

## 2012-11-06 ENCOUNTER — Encounter (HOSPITAL_BASED_OUTPATIENT_CLINIC_OR_DEPARTMENT_OTHER): Payer: Self-pay

## 2012-11-06 ENCOUNTER — Emergency Department (HOSPITAL_BASED_OUTPATIENT_CLINIC_OR_DEPARTMENT_OTHER)
Admission: EM | Admit: 2012-11-06 | Discharge: 2012-11-06 | Disposition: A | Discharge status: Home / Self Care | Payer: Medicaid Other | Attending: Emergency Medicine | Admitting: Emergency Medicine

## 2012-11-06 DIAGNOSIS — F909 Attention-deficit hyperactivity disorder, unspecified type: Secondary | ICD-10-CM | POA: Insufficient documentation

## 2012-11-06 DIAGNOSIS — R109 Unspecified abdominal pain: Secondary | ICD-10-CM | POA: Insufficient documentation

## 2012-11-06 DIAGNOSIS — E7800 Pure hypercholesterolemia, unspecified: Secondary | ICD-10-CM | POA: Insufficient documentation

## 2012-11-06 DIAGNOSIS — Z8711 Personal history of peptic ulcer disease: Secondary | ICD-10-CM | POA: Insufficient documentation

## 2012-11-06 DIAGNOSIS — R1110 Vomiting, unspecified: Secondary | ICD-10-CM

## 2012-11-06 DIAGNOSIS — E669 Obesity, unspecified: Secondary | ICD-10-CM | POA: Insufficient documentation

## 2012-11-06 DIAGNOSIS — R197 Diarrhea, unspecified: Secondary | ICD-10-CM

## 2012-11-06 DIAGNOSIS — Z79899 Other long term (current) drug therapy: Secondary | ICD-10-CM | POA: Insufficient documentation

## 2012-11-06 DIAGNOSIS — Z8614 Personal history of Methicillin resistant Staphylococcus aureus infection: Secondary | ICD-10-CM | POA: Insufficient documentation

## 2012-11-06 DIAGNOSIS — F319 Bipolar disorder, unspecified: Secondary | ICD-10-CM | POA: Insufficient documentation

## 2012-11-06 DIAGNOSIS — Z8719 Personal history of other diseases of the digestive system: Secondary | ICD-10-CM | POA: Insufficient documentation

## 2012-11-06 DIAGNOSIS — R112 Nausea with vomiting, unspecified: Secondary | ICD-10-CM | POA: Insufficient documentation

## 2012-11-06 DIAGNOSIS — K219 Gastro-esophageal reflux disease without esophagitis: Secondary | ICD-10-CM | POA: Insufficient documentation

## 2012-11-06 LAB — COMPREHENSIVE METABOLIC PANEL
AST: 24 U/L (ref 0–37)
CO2: 28 mEq/L (ref 19–32)
Calcium: 9.8 mg/dL (ref 8.4–10.5)
Creatinine, Ser: 1 mg/dL (ref 0.50–1.35)
GFR calc Af Amer: >90 mL/min (ref >90)
GFR calc non Af Amer: >90 mL/min (ref >90)
Sodium: 138 mEq/L (ref 135–145)
Total Protein: 7.7 g/dL (ref 6.0–8.3)

## 2012-11-06 LAB — URINALYSIS, ROUTINE W REFLEX MICROSCOPIC
Bilirubin Urine: NEGATIVE
Glucose, UA: NEGATIVE mg/dL
Hgb urine dipstick: NEGATIVE
Ketones, ur: NEGATIVE mg/dL
Protein, ur: NEGATIVE mg/dL
Urobilinogen, UA: 1 mg/dL (ref 0.0–1.0)

## 2012-11-06 LAB — CBC WITH DIFFERENTIAL/PLATELET
Basophils Absolute: 0 10*3/uL (ref 0.0–0.1)
Eosinophils Absolute: 0.1 10*3/uL (ref 0.0–0.7)
Eosinophils Relative: 1 % (ref 0–5)
HCT: 43.9 % (ref 39.0–52.0)
Lymphocytes Relative: 23 % (ref 12–46)
MCH: 27.7 pg (ref 26.0–34.0)
MCHC: 34.4 g/dL (ref 30.0–36.0)
MCV: 80.6 fL (ref 78.0–100.0)
Monocytes Absolute: 0.8 10*3/uL (ref 0.1–1.0)
Platelets: 281 10*3/uL (ref 150–400)
RDW: 14.4 % (ref 11.5–15.5)
WBC: 9.8 10*3/uL (ref 4.0–10.5)

## 2012-11-06 MED ORDER — OXYCODONE HCL 5 MG PO TABS: 5.0000 mg | ORAL_TABLET | ORAL | Status: DC | PRN

## 2012-11-06 MED ORDER — HYDROMORPHONE HCL PF 1 MG/ML IJ SOLN
1.0000 mg | Freq: Once | INTRAMUSCULAR | Status: AC
  Administered 2012-11-06: 1 mg via INTRAVENOUS
  Filled 2012-11-06: qty 1

## 2012-11-06 MED ORDER — IOHEXOL 300 MG/ML  SOLN
25.0000 mL | INTRAMUSCULAR | Status: DC
  Administered 2012-11-06: 25 mL via ORAL

## 2012-11-06 MED ORDER — ONDANSETRON HCL 4 MG/2ML IJ SOLN
4.0000 mg | Freq: Once | INTRAMUSCULAR | Status: AC
  Administered 2012-11-06: 4 mg via INTRAVENOUS
  Filled 2012-11-06: qty 2

## 2012-11-06 MED ORDER — PROMETHAZINE HCL 25 MG PO TABS: 25.0000 mg | ORAL_TABLET | Freq: Four times a day (QID) | ORAL | Status: DC | PRN

## 2012-11-06 MED ORDER — IOHEXOL 300 MG/ML  SOLN
100.0000 mL | Freq: Once | INTRAMUSCULAR | Status: AC | PRN
  Administered 2012-11-06: 100 mL via INTRAVENOUS

## 2012-11-06 NOTE — ED Provider Notes (Signed)
History     CSN: [id number]  Arrival date & time 11/06/12  4098   First MD Initiated Contact with Patient 11/06/12 2011      Chief Complaint  Patient presents with  . Diarrhea  . Abdominal Pain    (Consider location/radiation/quality/duration/timing/severity/associated sxs/prior treatment) Patient is a 26 y.o. male presenting with diarrhea and abdominal pain. The history is provided by the patient. No language interpreter was used.  Diarrhea The primary symptoms include abdominal pain, nausea, vomiting and diarrhea. The illness began today. The onset was gradual. The problem has been gradually worsening.  The abdominal pain began today. The abdominal pain has been gradually worsening since its onset. The abdominal pain is located in the LLQ and RLQ. The abdominal pain does not radiate. The severity of the abdominal pain is 7/10. The abdominal pain is relieved by nothing.  The illness does not include chills. Significant associated medical issues include GERD and PUD.  Abdominal Pain The primary symptoms of the illness include abdominal pain, nausea, vomiting and diarrhea.  Symptoms associated with the illness do not include chills. Significant associated medical issues include PUD and GERD.  Pt reports he has ulcers.  Pt reports he had had diarrhea for 3 days,  vomiting today.  Pt sees Dr. Jena Gauss in [place] and is on hycosamine   Pt reports pain is worsen in right and left lower abdomen.   Pt reports there is a area in lower abdomen on left that feels swollen  Past Medical History  Diagnosis Date  . Tobacco dipper   . GERD (gastroesophageal reflux disease)   . ADHD (attention deficit hyperactivity disorder)   . Hx MRSA infection 8/08    right thigh  . S/P colonoscopy 11/10, 10/08    Dr Elmer Ramp  . S/P endoscopy 10/08, 2/10, 5/10    Dr Darnelle Going reflux esophagitis, small HH  . Occult GI bleeding 12/2008    Trivial upper GI bleed/uncontrolled GERD by upper endoscopy  12/2008, normal f/u endoscopy 03/2009 with [facility]BCE at that time  . Bipolar 1 disorder   . Esophagitis     Distal esophageal erosions consistent with mild erosive  reflux esophagitis 12/2008 by EGD   . Thyroid function test abnormal     Noted in 2011 discharge  . Obesity   . Gastric ulcer   . Hypercholesteremia     Past Surgical History  Procedure Date  . Vasectomy   . Ankle surgery   . Small bowel capsule 04/11/2009     normal throughout  . Esophagogastroduodenoscopy  04/07/2009    Normal esophagus, small hiatal hernia  . Esophagogastroduodenoscopy 01/09/2009    Distal esophageal erosions consistent with mild erosive reflux esophagitis, otherwise normal esophagus, small hiatal herniaotherwise normal stomach, D1-D2   . Ileocolonoscopy 08/26/2007     A normal rectum, colon, and terminal ileum  . Esophagogastroduodenoscopy 08/26/2007    Normal esophagus, a small hiatal/hernia, otherwise normal stomach D1 through D3  . Colonoscopy 10/10/2009    anal papilla otherwise normal    Family History  Problem Relation Age of Onset  . Leukemia Father 65  . Seizures Mother   . Bipolar disorder Brother   . ADD / ADHD Brother     History  Substance Use Topics  . Smoking status: Never Smoker   . Smokeless tobacco: Current User    Types: Chew     Comment: DIPx 10 years  . Alcohol Use: Yes      Review of Systems  Constitutional: Negative for  chills.  Gastrointestinal: Positive for nausea, vomiting, abdominal pain and diarrhea.  All other systems reviewed and are negative.    Allergies  Tylenol  Home Medications   Current Outpatient Rx  Name  Route  Sig  Dispense  Refill  . AMOXICILLIN-POT CLAVULANATE 875-125 MG PO TABS   Oral   Take 1 tablet by mouth 2 (two) times daily.         [name] Kitchen FLUOXETINE HCL 10 MG PO CAPS   Oral   Take 40 mg by mouth daily.          [name] Kitchen FLUTICASONE PROPIONATE 50 MCG/ACT NA SUSP   Nasal   Place 2 sprays into the nose daily.   16 g   0   .  HYOSCYAMINE SULFATE 0.125 MG PO TABS   Oral   Take 0.25 mg by mouth every 6 (six) hours as needed.          [name] Kitchen OLANZAPINE 10 MG PO TABS   Oral   Take 10 mg by mouth at bedtime.         [name] Kitchen PANTOPRAZOLE SODIUM 40 MG PO TBEC   Oral   Take 1 tablet (40 mg total) by mouth 2 (two) times daily before a meal.   62 tablet   0   . UNKNOWN TO PATIENT      Cholesterol lowering medication           BP 128/81  Pulse 98  Temp 97.7 F (36.5 C) (Oral)  Resp 18  Ht 6' (1.829 m)  Wt 365 lb (165.563 kg)  BMI 49.50 kg/m2  SpO2 100%  Physical Exam  Nursing note and vitals reviewed. Constitutional: He appears well-developed and well-nourished.  HENT:  Head: Normocephalic and atraumatic.  Eyes: Conjunctivae normal and EOM are normal. Pupils are equal, round, and reactive to light.  Neck: Normal range of motion.  Cardiovascular: Normal rate and normal heart sounds.   Pulmonary/Chest: Effort normal and breath sounds normal.  Abdominal: Soft. There is tenderness. There is no guarding.  Musculoskeletal: Normal range of motion.  Neurological: He is alert.  Skin: Skin is warm.  Psychiatric: He has a normal mood and affect.    ED Course  Procedures (including critical care time)   Labs Reviewed  URINALYSIS, ROUTINE W REFLEX MICROSCOPIC  CBC WITH DIFFERENTIAL  COMPREHENSIVE METABOLIC PANEL  LIPASE, BLOOD   No results found.   No diagnosis found.    MDM   Results for orders placed during the hospital encounter of 11/06/12  URINALYSIS, ROUTINE W REFLEX MICROSCOPIC      Component Value Range   Color, Urine YELLOW  YELLOW   APPearance CLEAR  CLEAR   Specific Gravity, Urine 1.024  1.005 - 1.030   pH 6.5  5.0 - 8.0   Glucose, UA NEGATIVE  NEGATIVE mg/dL   Hgb urine dipstick NEGATIVE  NEGATIVE   Bilirubin Urine NEGATIVE  NEGATIVE   Ketones, ur NEGATIVE  NEGATIVE mg/dL   Protein, ur NEGATIVE  NEGATIVE mg/dL   Urobilinogen, UA 1.0  0.0 - 1.0 mg/dL   Nitrite NEGATIVE  NEGATIVE    Leukocytes, UA NEGATIVE  NEGATIVE  CBC WITH DIFFERENTIAL      Component Value Range   WBC 9.8  4.0 - 10.5 K/uL   RBC 5.45  4.22 - 5.81 MIL/uL   Hemoglobin 15.1  13.0 - 17.0 g/dL   HCT 30.8  65.7 - 84.6 %   MCV 80.6  78.0 - 100.0 fL   MCH  27.7  26.0 - 34.0 pg   MCHC 34.4  30.0 - 36.0 g/dL   RDW 16.1  09.6 - 04.5 %   Platelets 281  150 - 400 K/uL   Neutrophils Relative 68  43 - 77 %   Neutro Abs 6.6  1.7 - 7.7 K/uL   Lymphocytes Relative 23  12 - 46 %   Lymphs Abs 2.2  0.7 - 4.0 K/uL   Monocytes Relative 8  3 - 12 %   Monocytes Absolute 0.8  0.1 - 1.0 K/uL   Eosinophils Relative 1  0 - 5 %   Eosinophils Absolute 0.1  0.0 - 0.7 K/uL   Basophils Relative 0  0 - 1 %   Basophils Absolute 0.0  0.0 - 0.1 K/uL  COMPREHENSIVE METABOLIC PANEL      Component Value Range   Sodium 138  135 - 145 mEq/L   Potassium 4.0  3.5 - 5.1 mEq/L   Chloride 99  96 - 112 mEq/L   CO2 28  19 - 32 mEq/L   Glucose, Bld 94  70 - 99 mg/dL   BUN 12  6 - 23 mg/dL   Creatinine, Ser 4.09  0.50 - 1.35 mg/dL   Calcium 9.8  8.4 - 81.1 mg/dL   Total Protein 7.7  6.0 - 8.3 g/dL   Albumin 4.1  3.5 - 5.2 g/dL   AST 24  0 - 37 U/L   ALT 32  0 - 53 U/L   Alkaline Phosphatase 63  39 - 117 U/L   Total Bilirubin 0.5  0.3 - 1.2 mg/dL   GFR calc non Af Amer >90  >90 mL/min   GFR calc Af Amer >90  >90 mL/min  LIPASE, BLOOD      Component Value Range   Lipase 32  11 - 59 U/L   Dg Chest 2 View  10/15/2012  *RADIOLOGY REPORT*  Clinical Data: 26 year old male with cough congestion fever.  CHEST - 2 VIEW  Comparison: 05/03/2012 and earlier.  Findings: Stable lung volumes.  Cardiac size and mediastinal contours are within normal limits.  Visualized tracheal air column is within normal limits.  Lung parenchyma is stable and clear.  No pneumothorax or effusion. No acute osseous abnormality identified.  IMPRESSION: No acute cardiopulmonary abnormality.   Original Report Authenticated By: Erskine Speed, M.D.    Ct Abdomen Pelvis  W Contrast  11/06/2012  *RADIOLOGY REPORT*  Clinical Data: Vomiting abdominal pain.  CT ABDOMEN AND PELVIS WITH CONTRAST  Technique:  Multidetector CT imaging of the abdomen and pelvis was performed following the standard protocol during bolus administration of intravenous contrast.  Contrast: OMNIPAQUE IOHEXOL 300 MG/ML  SOLN  Comparison: CT 06/19/2012  Findings: Lung bases are clear.  No pericardial fluid. There is subcutaneous 8 nodule noted within the right anterior chest wall. (Image #1).  This could represent breast tissue or a subcutaneous cyst.  No focal hepatic lesion.  Gallbladder, pancreas, spleen, adrenal glands, and kidneys are normal.  The stomach, small bowel, and cecum are normal.  Appendix not identified but there is no peri cecal inflammation to suggest ascites.  The colon rectosigmoid colon are normal.  Abdominal aorta is normal caliber.  No retroperitoneal periportal lymphadenopathy.  No free fluid the pelvis.  Prostate gland bladder normal.  No pelvic lymphadenopathy.  No evidence of inguinal hernia abdominal hernia.  No aggressive osseous lesions.  IMPRESSION: No acute abdominal or pelvic findings.  No change from prior.  Potential  gynecomastia of right breast versus subcutaneous nodule cyst.   Original Report Authenticated By: Genevive Bi, M.D.      Pt given rx for phenergan for nausea.   I advised pt to see Dr. Benard Rink next week for recheck if symptoms persist      Lonia Skinner [place], [place] 11/06/12 2209  Lonia Skinner [place], [place] 11/06/12 2214

## 2012-11-06 NOTE — ED Notes (Signed)
C/o diarrhea x 3 days, abd pain, vomiting today

## 2012-11-07 NOTE — ED Provider Notes (Signed)
Medical screening examination/treatment/procedure(s) were performed by non-physician practitioner and as supervising physician I was immediately available for consultation/collaboration.   Blakelee Allington L Zianna Dercole, MD 11/07/12 0613 

## 2013-01-29 ENCOUNTER — Ambulatory Visit: Payer: Medicaid Other | Admitting: Internal Medicine

## 2013-03-05 ENCOUNTER — Ambulatory Visit: Payer: Medicaid Other | Admitting: Internal Medicine

## 2013-03-16 ENCOUNTER — Ambulatory Visit: Payer: Self-pay | Admitting: Family Medicine

## 2013-03-25 ENCOUNTER — Encounter: Payer: Self-pay | Admitting: Family Medicine

## 2013-03-25 ENCOUNTER — Ambulatory Visit (INDEPENDENT_AMBULATORY_CARE_PROVIDER_SITE_OTHER): Payer: Medicaid Other | Admitting: Family Medicine

## 2013-03-25 VITALS — BP 124/75 | HR 80 | Temp 97.4°F | Ht 69.0 in | Wt 377.8 lb

## 2013-03-25 DIAGNOSIS — L0390 Cellulitis, unspecified: Secondary | ICD-10-CM

## 2013-03-25 DIAGNOSIS — L0291 Cutaneous abscess, unspecified: Secondary | ICD-10-CM

## 2013-03-25 DIAGNOSIS — B029 Zoster without complications: Secondary | ICD-10-CM

## 2013-03-25 MED ORDER — VALACYCLOVIR HCL 1 G PO TABS: 1000.0000 mg | ORAL_TABLET | Freq: Three times a day (TID) | ORAL | Status: DC

## 2013-03-25 MED ORDER — CEPHALEXIN 500 MG PO CAPS: 500.0000 mg | ORAL_CAPSULE | Freq: Three times a day (TID) | ORAL | Status: DC

## 2013-03-25 MED ORDER — TRAMADOL HCL 50 MG PO TABS: ORAL_TABLET | ORAL | Status: DC

## 2013-03-25 NOTE — Progress Notes (Signed)
Subjective:    Patient ID: [name], male    DOB: [birth year], 27 y.o.   MRN: [id number]  HPI Patient presents with painful rash on right forehead. This has been present for 2 days. The pain was so severe it kept him awake at nighttime. Very painful in his right ear.   Review of Systems  Constitutional: Negative for fever, chills and fatigue.  HENT: Positive for ear pain (right ear). Negative for ear discharge. Facial swelling: forehead and right temple region.   Eyes: Negative.   Respiratory: Negative.   Cardiovascular: Negative.   Gastrointestinal: Negative.   Endocrine: Negative.   Genitourinary: Negative.   Musculoskeletal: Negative.   Skin: Positive for color change (red area on forehead).  Allergic/Immunologic: Negative.   Neurological: Negative for dizziness, light-headedness and headaches.  Hematological: Negative.   Psychiatric/Behavioral: Negative.        Objective:   Physical Exam Inflamed rash with 3 discrete areas on right fore head. Ears nose and throat appear normal. Chest was clear with a normal heart rate and rhythm.  No anterior cervical nodes       Assessment & Plan:  1. Shingles rash Valtrex 1 g 3 times daily for 7 day Ultram 50 mg 4 times daily if needed for severe pain every  2. Cellulitis Keflex 500 mg 3 times daily for 10 day  Patient Instructions  Take medications as directed Recheck in 10 day   Shingles Shingles is caused by the same virus that causes chickenpox (varicella zoster virus or VZV). Shingles often occurs many years or decades after having chickenpox. That is why it is more common in adults older than 50 years. The virus reactivates and breaks out as an infection in a nerve root. SYMPTOMS   The initial feeling (sensations) may be pain. This pain is usually described as:  Burning.  Stabbing.  Throbbing.  Tingling in the nerve root.  A red rash will follow in a couple days. The rash may occur in any area of the  body and is usually on one side (unilateral) of the body in a band or belt-like pattern. The rash usually starts out as very small blisters (vesicles). They will dry up after 7 to 10 days. This is not usually a significant problem except for the pain it causes.  Long-lasting (chronic) pain is more likely in an elderly person. It can last months to years. This condition is called postherpetic neuralgia. Shingles can be an extremely severe infection in someone with AIDS, a weakened immune system, or with forms of leukemia. It can also be severe if you are taking transplant medicines or other medicines that weaken the immune system. TREATMENT  Your caregiver will often treat you with:  Antiviral drugs.  Anti-inflammatory drugs.  Pain medicines. Bed rest is very important in preventing the pain associated with herpes zoster (postherpetic neuralgia). Application of heat in the form of a hot water bottle or electric heating pad or gentle pressure with the hand is recommended to help with the pain or discomfort. PREVENTION  A varicella zoster vaccine is available to help protect against the virus. The Food and Drug Administration approved the varicella zoster vaccine for individuals 44 years of age and older. HOME CARE INSTRUCTIONS   Cool compresses to the area of rash may be helpful.  Only take over-the-counter or prescription medicines for pain, discomfort, or fever as directed by your caregiver.  Avoid contact with:  Babies.  Pregnant women.  Children with eczema.  Elderly people with transplants.  People with chronic illnesses, such as leukemia and AIDS.  If the area involved is on your face, you may receive a referral for follow-up to a specialist. It is very important to keep all follow-up appointments. This will help avoid eye complications, chronic pain, or disability. SEEK IMMEDIATE MEDICAL CARE IF:   You develop any pain (headache) in the area of the face or eye. This must be  followed carefully by your caregiver or ophthalmologist. An infection in part of your eye (cornea) can be very serious. It could lead to blindness.  You do not have pain relief from prescribed medicines.  Your redness or swelling spreads.  The area involved becomes very swollen and painful.  You have a fever.  You notice any red or painful lines extending away from the affected area toward your heart (lymphangitis).  Your condition is worsening or has changed. Document Released: 11/11/2005 Document Revised: 02/03/2012 Document Reviewed: 10/16/2009 [facility] Patient Information 2013 [place], [place].

## 2013-03-25 NOTE — Patient Instructions (Addendum)
Take medications as directed Recheck in 10 day   Shingles Shingles is caused by the same virus that causes chickenpox (varicella zoster virus or VZV). Shingles often occurs many years or decades after having chickenpox. That is why it is more common in adults older than 50 years. The virus reactivates and breaks out as an infection in a nerve root. SYMPTOMS   The initial feeling (sensations) may be pain. This pain is usually described as:  Burning.  Stabbing.  Throbbing.  Tingling in the nerve root.  A red rash will follow in a couple days. The rash may occur in any area of the body and is usually on one side (unilateral) of the body in a band or belt-like pattern. The rash usually starts out as very small blisters (vesicles). They will dry up after 7 to 10 days. This is not usually a significant problem except for the pain it causes.  Long-lasting (chronic) pain is more likely in an elderly person. It can last months to years. This condition is called postherpetic neuralgia. Shingles can be an extremely severe infection in someone with AIDS, a weakened immune system, or with forms of leukemia. It can also be severe if you are taking transplant medicines or other medicines that weaken the immune system. TREATMENT  Your caregiver will often treat you with:  Antiviral drugs.  Anti-inflammatory drugs.  Pain medicines. Bed rest is very important in preventing the pain associated with herpes zoster (postherpetic neuralgia). Application of heat in the form of a hot water bottle or electric heating pad or gentle pressure with the hand is recommended to help with the pain or discomfort. PREVENTION  A varicella zoster vaccine is available to help protect against the virus. The Food and Drug Administration approved the varicella zoster vaccine for individuals 39 years of age and older. HOME CARE INSTRUCTIONS   Cool compresses to the area of rash may be helpful.  Only take over-the-counter  or prescription medicines for pain, discomfort, or fever as directed by your caregiver.  Avoid contact with:  Babies.  Pregnant women.  Children with eczema.  Elderly people with transplants.  People with chronic illnesses, such as leukemia and AIDS.  If the area involved is on your face, you may receive a referral for follow-up to a specialist. It is very important to keep all follow-up appointments. This will help avoid eye complications, chronic pain, or disability. SEEK IMMEDIATE MEDICAL CARE IF:   You develop any pain (headache) in the area of the face or eye. This must be followed carefully by your caregiver or ophthalmologist. An infection in part of your eye (cornea) can be very serious. It could lead to blindness.  You do not have pain relief from prescribed medicines.  Your redness or swelling spreads.  The area involved becomes very swollen and painful.  You have a fever.  You notice any red or painful lines extending away from the affected area toward your heart (lymphangitis).  Your condition is worsening or has changed. Document Released: 11/11/2005 Document Revised: 02/03/2012 Document Reviewed: 10/16/2009 [facility] Patient Information 2013 [place], [place].

## 2013-05-07 ENCOUNTER — Other Ambulatory Visit: Payer: Self-pay | Admitting: *Deleted

## 2013-05-07 MED ORDER — PRAVASTATIN SODIUM 40 MG PO TABS: 40.0000 mg | ORAL_TABLET | Freq: Every day | ORAL | Status: DC

## 2013-05-12 ENCOUNTER — Encounter: Payer: Self-pay | Admitting: Internal Medicine

## 2013-05-12 ENCOUNTER — Ambulatory Visit (INDEPENDENT_AMBULATORY_CARE_PROVIDER_SITE_OTHER): Payer: Medicaid Other | Admitting: Internal Medicine

## 2013-05-12 VITALS — BP 120/70 | HR 75 | Ht 69.0 in | Wt 394.0 lb

## 2013-05-12 DIAGNOSIS — G4733 Obstructive sleep apnea (adult) (pediatric): Secondary | ICD-10-CM

## 2013-05-12 NOTE — Progress Notes (Signed)
07/14/12- 57 yoM nonsmoker referred courtesy of Dr Johney Frame for evaluation of sleep complaints.  He has a hx of loud snoring and daytime somnolence, with no ENT surgery. A recent ER visit for chest pain ruled out cardiac. While there he was witnessed having apneic episodes. Wife notes loud snoring. He admits history of easy daytime sleepiness if sits quietly. Was treated with Ritalin in the past but not in recent years. Denies hypnagogic hallucinations, sleep paralysis cataplexy by my description.   bedtime between 10 and 11 PM, sleep latency one hour, wakes 3 times, and is rolling over, before up at 8 AM. History of bipolar disorder, ADHD but no cardiopulmonary disease and no ENT surgery. 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  08/20/12- 26 yoM nonsmoker followed for obstructive sleep apnea complicated by history of bipolar disorder, GERD, obesity He recognizes no changes since last visit. NPSG 08/03/12-  AHI 24.1/ hr, moderate OSA. Discussed treatment options  10/01/12- 26 yoM nonsmoker followed for obstructive sleep apnea complicated by history of bipolar disorder, GERD, obesity Review CPAP usage;pt wears CPAP every night for about 7 hours; still moves alot during sleep Had flu vax. Using CPAP 17/ Advanced all night, every night. Changed from auto to fixed pressure 17 with god control documented. Full face mask and humidifier.  6/18/ 14-27 yoM nonsmoker followed for obstructive sleep apnea complicated by history of bipolar disorder, GERD, obesity FOLLOWS FOR: wears CPAP/ 17/ Advanced every night for at least 6 hours; Can tell if he hasnt slept with it-worn out and snores.   ROS-see HPI Constitutional:   No-   weight loss, night sweats, fevers, chills, fatigue, lassitude. HEENT:   No-  headaches, difficulty swallowing, tooth/dental problems, +sore throat,       No-  sneezing, itching, ear ache, nasal congestion, post nasal drip,  CV:  No-  cardiac chest  pain, orthopnea, PND, swelling in lower extremities, anasarca,  dizziness, palpitations Resp: + shortness of breath with exertion or at rest.              No-   productive cough,  No non-productive cough,  No- coughing up of blood.              No-   change in color of mucus.  No- wheezing.   Skin: No-   rash or lesions. GI:  + heartburn, indigestion, no-abdominal pain, nausea, vomiting,  GU:  MS:  No-   joint pain or swelling.  Neuro-     nothing unusual Psych:   See HPI-No- change in mood or affect. + depression or anxiety.  No memory loss.  OBJ- Physical Exam General- Alert, Oriented, Affect-appropriate, Distress- none acute, obese/ big Skin- rash-none, lesions- none, excoriation- none Lymphadenopathy- none Head- atraumatic            Eyes- Gross vision intact, PERRLA, conjunctivae and secretions clear            Ears- Hearing, canals-normal            Nose- Clear, no-Septal dev, mucus, polyps, erosion, perforation             Throat- Mallampati III , mucosa clear , drainage- none, tonsils- 2+ Neck- flexible , trachea midline, no stridor , thyroid nl, carotid no bruit Chest - symmetrical excursion , unlabored           Heart/CV- RRR , no murmur , no gallop  , no rub, nl s1 s2                           -  JVD- none , edema- none, stasis changes- none, varices- none           Lung- clear to P&A, wheeze- none, cough- none , dullness-none, rub- none           Chest wall-  Abd-  Br/ Gen/ Rectal- Not done, not indicated Extrem- cyanosis- none, clubbing, none, atrophy- none, strength- nl Neuro- grossly intact to observation

## 2013-05-12 NOTE — Patient Instructions (Addendum)
We can continue CPAP 17/ Advanced  Please call as needed 

## 2013-05-29 NOTE — Assessment & Plan Note (Signed)
Good compliance and control. Pressure seems appropriate. I encouraged weight loss

## 2013-06-07 ENCOUNTER — Ambulatory Visit: Payer: Medicaid Other | Admitting: Internal Medicine

## 2013-06-07 ENCOUNTER — Other Ambulatory Visit: Payer: Self-pay | Admitting: *Deleted

## 2013-06-07 MED ORDER — OLANZAPINE 10 MG PO TABS: 10.0000 mg | ORAL_TABLET | Freq: Every day | ORAL | Status: DC

## 2013-06-07 NOTE — Telephone Encounter (Signed)
LAST OV 03/25/13.

## 2013-08-19 ENCOUNTER — Ambulatory Visit (INDEPENDENT_AMBULATORY_CARE_PROVIDER_SITE_OTHER): Payer: Medicaid Other | Admitting: Family Medicine

## 2013-08-19 VITALS — BP 121/67 | HR 67 | Temp 97.0°F | Ht 69.0 in | Wt >= 6400 oz

## 2013-08-19 DIAGNOSIS — Z0000 Encounter for general adult medical examination without abnormal findings: Secondary | ICD-10-CM

## 2013-08-19 DIAGNOSIS — M129 Arthropathy, unspecified: Secondary | ICD-10-CM

## 2013-08-19 DIAGNOSIS — R7309 Other abnormal glucose: Secondary | ICD-10-CM

## 2013-08-19 DIAGNOSIS — E662 Morbid (severe) obesity with alveolar hypoventilation: Secondary | ICD-10-CM

## 2013-08-19 DIAGNOSIS — E785 Hyperlipidemia, unspecified: Secondary | ICD-10-CM

## 2013-08-19 DIAGNOSIS — F319 Bipolar disorder, unspecified: Secondary | ICD-10-CM

## 2013-08-19 DIAGNOSIS — K219 Gastro-esophageal reflux disease without esophagitis: Secondary | ICD-10-CM

## 2013-08-19 DIAGNOSIS — G4730 Sleep apnea, unspecified: Secondary | ICD-10-CM

## 2013-08-19 DIAGNOSIS — B372 Candidiasis of skin and nail: Secondary | ICD-10-CM

## 2013-08-19 DIAGNOSIS — R739 Hyperglycemia, unspecified: Secondary | ICD-10-CM

## 2013-08-19 DIAGNOSIS — M1990 Unspecified osteoarthritis, unspecified site: Secondary | ICD-10-CM

## 2013-08-19 LAB — POCT CBC
Granulocyte percent: 63 %G (ref 37–80)
HCT, POC: 45.2 % (ref 43.5–53.7)
Hemoglobin: 14.6 g/dL (ref 14.1–18.1)
Lymph, poc: 2.6 (ref 0.6–3.4)
MCH, POC: 26.1 pg — AB (ref 27–31.2)
MCHC: 32.3 g/dL (ref 31.8–35.4)
MCV: 80.8 fL (ref 80–97)
MPV: 7.8 fL (ref 0–99.8)
POC Granulocyte: 4.5 (ref 2–6.9)
POC LYMPH PERCENT: 36 %L (ref 10–50)
Platelet Count, POC: 231 10*3/uL (ref 142–424)
RBC: 5.6 M/uL (ref 4.69–6.13)
RDW, POC: 15.8 %
WBC: 7.1 10*3/uL (ref 4.6–10.2)

## 2013-08-19 LAB — POCT GLYCOSYLATED HEMOGLOBIN (HGB A1C): Hemoglobin A1C: 5.2

## 2013-08-19 MED ORDER — PANTOPRAZOLE SODIUM 40 MG PO TBEC: 40.0000 mg | DELAYED_RELEASE_TABLET | Freq: Two times a day (BID) | ORAL | Status: DC

## 2013-08-19 MED ORDER — FLUOXETINE HCL 40 MG PO CAPS: 40.0000 mg | ORAL_CAPSULE | Freq: Every day | ORAL | Status: DC

## 2013-08-19 MED ORDER — DICLOFENAC SODIUM 1 % TD GEL: 2.0000 g | Freq: Four times a day (QID) | TRANSDERMAL | Status: DC

## 2013-08-19 MED ORDER — OLANZAPINE 10 MG PO TABS: 10.0000 mg | ORAL_TABLET | Freq: Every day | ORAL | Status: DC

## 2013-08-19 MED ORDER — FLUCONAZOLE 150 MG PO TABS: ORAL_TABLET | ORAL | Status: DC

## 2013-08-19 MED ORDER — NYSTATIN 100000 UNIT/GM EX POWD: 1.0000 | Freq: Two times a day (BID) | CUTANEOUS | Status: DC

## 2013-08-19 NOTE — Progress Notes (Signed)
Subjective:    Patient ID: [name], male    DOB: 1986/04/01, 27 y.o.   MRN: [id number]  HPI This 27 y.o. male presents for evaluation of follow up.  He has hx of obesity and OSAS. He has hx of hypersomnolence and is doing much better with this since he has been using CPAP.  His wife who accompanies him states he still snores through his cpap.  He has hx Of borderline DM and elevated hgbaic of 5.8%.  He has been having problems with obesity And part of this is due to the zyprexa which helps control bipolar disorder.  He has arthritis in his Ankles from  Injuries and surgeries in the past. 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  He has hx of hyperlipidemia.  He has been having difficulty with Candidiasis rash under his large pandiculous abdomen. He has hx of GERD and gastric ulcers and He reports his GERD is controlled with protonix 40mg  bid.  He wants to see if he can get some Bariatric surgery to help his health and decrease his arthritis pain.  He is wanting to avoid full blown Diabetes since he has family hx and has borderline DM hx..   Review of Systems C/o arthritis, obesity, OSAS, fatigue, and rash. No chest pain, SOB, HA, dizziness, vision change, N/V, diarrhea, constipation, dysuria, urinary urgency or frequency, myalgias, or arthralgias.     Objective:   Physical Exam Vital signs noted  Well developed well nourished obese male.  HEENT - Head atraumatic Normocephalic                Eyes - PERRLA, Conjuctiva - clear Sclera- Clear EOMI                Ears - EAC's Wnl TM's Wnl Gross Hearing WNL                Nose - Nares patent                 Throat - oropharanx wnl Respiratory - Lungs CTA bilateral Cardiac - RRR S1 and S2 without murmur GI -Large pandiculus  Abdomen soft Nontender and bowel sounds active x 4 Extremities - General edema in lower extremities Neuro - Grossly intact. Skin - Erythematous rash in  abdominal skin folds and bilateral Tinea Cruris. MS - TTP bilateral ankles.      Assessment & Plan:  Candidal intertrigo - Plan: nystatin (MYCOSTATIN/NYSTOP) 100000 UNIT/GM POWD, fluconazole (DIFLUCAN) 150 MG tablet  GERD (gastroesophageal reflux disease) - Plan: pantoprazole (PROTONIX) 40 MG tablet, DISCONTINUED: pantoprazole (PROTONIX) 40 MG tablet.  Weight loss will help improve   Arthritis - Plan: diclofenac sodium (VOLTAREN) 1 % GEL.  Refer to Bariatric surgery to help get weight down and this will not bother him as much.  Bipolar disorder, unspecified - Plan: OLANZapine (ZYPREXA) 10 MG tablet, FLUoxetine (PROZAC) 40 MG capsule.  His multiple co-morbids from his obesity do not help this condition.  His zyprexa works well but also contributes to his weight gain and obesity problems.  Unspecified sleep apnea - Continue current sleep apnea and see if he can be aggressive with weight loss efforts and refer to general surgery for bariatrics.  Routine general medical examination at a health care facility - Plan: POCT CBC, CMP14+EGFR, POCT glycosylated hemoglobin (Hb A1C), Lipid panel, Thyroid Panel With TSH  Other and unspecified hyperlipidemia - Plan: Lipid panel  Obesity hypoventilation syndrome - Plan:  Ambulatory referral to General Surgery.  This would Help tx hismultiple co-morbids and help to control.  His arthritis condition would improve, so would His OSAS, this would help with controlling his hyperglycemia and prevent and tx diabetes.  Hyperglycemia - Plan: POCT glycosylated hemoglobin (Hb A1C).  Discussed losing weight and exercising if able and to eat low carb diet.  Will tx for diabetes if meets criteria.  Follow up in 3 months   [name] FNP

## 2013-08-19 NOTE — Patient Instructions (Addendum)
Rash A rash is a change in the color or texture of your skin. There are many different types of rashes. You may have other problems that accompany your rash. CAUSES   Infections.  Allergic reactions. This can include allergies to pets or foods.  Certain medicines.  Exposure to certain chemicals, soaps, or cosmetics.  Heat.  Exposure to poisonous plants.  Tumors, both cancerous and noncancerous. SYMPTOMS   Redness.  Scaly skin.  Itchy skin.  Dry or cracked skin.  Bumps.  Blisters.  Pain. DIAGNOSIS  Your caregiver may do a physical exam to determine what type of rash you have. A skin sample (biopsy) may be taken and examined under a microscope. TREATMENT  Treatment depends on the type of rash you have. Your caregiver may prescribe certain medicines. For serious conditions, you may need to see a skin doctor (dermatologist). HOME CARE INSTRUCTIONS   Avoid the substance that caused your rash.  Do not scratch your rash. This can cause infection.  You may take cool baths to help stop itching.  Only take over-the-counter or prescription medicines as directed by your caregiver.  Keep all follow-up appointments as directed by your caregiver. SEEK IMMEDIATE MEDICAL CARE IF:  You have increasing pain, swelling, or redness.  You have a fever.  You have new or severe symptoms.  You have body aches, diarrhea, or vomiting.  Your rash is not better after 3 days. MAKE SURE YOU:  Understand these instructions.  Will watch your condition.  Will get help right away if you are not doing well or get worse. Document Released: 11/01/2002 Document Revised: 02/03/2012 Document Reviewed: 08/26/2011 [facility] Patient Information 2014 [place], [place]. Calorie Counting Diet A calorie counting diet requires you to eat the number of calories that are right for you in a day. Calories are the measurement of how much energy you get from the food you eat. Eating the right amount of  calories is important for staying at a healthy weight. If you eat too many calories, your body will store them as fat and you may gain weight. If you eat too few calories, you may lose weight. Counting the number of calories you eat during a day will help you know if you are eating the right amount. A Registered Dietitian can determine how many calories you need in a day. The amount of calories needed varies from person to person. If your goal is to lose weight, you will need to eat fewer calories. Losing weight can benefit you if you are overweight or have health problems such as heart disease, high blood pressure, or diabetes. If your goal is to gain weight, you will need to eat more calories. Gaining weight may be necessary if you have a certain health problem that causes your body to need more energy. TIPS Whether you are increasing or decreasing the number of calories you eat during a day, it may be hard to get used to changes in what you eat and drink. The following are tips to help you keep track of the number of calories you eat.  Measure foods at home with measuring cups. This helps you know the amount of food and number of calories you are eating.  Restaurants often serve food in amounts that are larger than 1 serving. While eating out, estimate how many servings of a food you are given. For example, a serving of cooked rice is  cup or about the size of half of a fist. Knowing serving sizes will  help you be aware of how much food you are eating at restaurants.  Ask for smaller portion sizes or child-size portions at restaurants.  Plan to eat half of a meal at a restaurant. Take the rest home or share the other half with a friend.  Read the Nutrition Facts panel on food labels for calorie content and serving size. You can find out how many servings are in a package, the size of a serving, and the number of calories each serving has.  For example, a package might contain 3 cookies. The  Nutrition Facts panel on that package says that 1 serving is 1 cookie. Below that, it will say there are 3 servings in the container. The calories section of the Nutrition Facts label says there are 90 calories. This means there are 90 calories in 1 cookie (1 serving). If you eat 1 cookie you have eaten 90 calories. If you eat all 3 cookies, you have eaten 270 calories (3 servings x 90 calories = 270 calories). The list below tells you how big or small some common portion sizes are.  1 oz.........4 stacked dice.  3 oz........[name] KitchenDeck of cards.  1 tsp.......[name] KitchenTip of little finger.  1 tbs......[name] KitchenMarland KitchenThumb.  2 tbs.......[name] KitchenGolf ball.   cup......[name] KitchenHalf of a fist.  1 cup.......[name] KitchenA fist. KEEP A FOOD LOG Write down every food item you eat, the amount you eat, and the number of calories in each food you eat during the day. At the end of the day, you can add up the total number of calories you have eaten. It may help to keep a list like the one below. Find out the calorie information by reading the Nutrition Facts panel on food labels. Breakfast  Bran cereal (1 cup, 110 calories).  Fat-free milk ( cup, 45 calories). Snack  Apple (1 medium, 80 calories). Lunch  Spinach (1 cup, 20 calories).  Tomato ( medium, 20 calories).  Chicken breast strips (3 oz, 165 calories).  Shredded cheddar cheese ( cup, 110 calories).  Light [place] dressing (2 tbs, 60 calories).  Whole-wheat bread (1 slice, 80 calories).  Tub margarine (1 tsp, 35 calories).  Vegetable soup (1 cup, 160 calories). Dinner  Pork chop (3 oz, 190 calories).  Brown rice (1 cup, 215 calories).  Steamed broccoli ( cup, 20 calories).  Strawberries (1  cup, 65 calories).  Whipped cream (1 tbs, 50 calories). Daily Calorie Total: 1425 Document Released: 11/11/2005 Document Revised: 02/03/2012 Document Reviewed: 05/08/2007 [facility] Patient Information 2014 [place], [place].

## 2013-08-20 LAB — LIPID PANEL
Chol/HDL Ratio: 3.7 ratio units (ref 0.0–5.0)
Cholesterol, Total: 151 mg/dL (ref 100–199)
HDL: 41 mg/dL (ref >39)
LDL Calculated: 81 mg/dL (ref 0–99)
Triglycerides: 143 mg/dL (ref 0–149)
VLDL Cholesterol Cal: 29 mg/dL (ref 5–40)

## 2013-08-20 LAB — CMP14+EGFR
ALT: 32 IU/L (ref 0–44)
AST: 24 IU/L (ref 0–40)
Albumin/Globulin Ratio: 2 (ref 1.1–2.5)
Albumin: 4.4 g/dL (ref 3.5–5.5)
Alkaline Phosphatase: 62 IU/L (ref 39–117)
BUN/Creatinine Ratio: 13 (ref 8–19)
BUN: 12 mg/dL (ref 6–20)
CO2: 25 mmol/L (ref 18–29)
Calcium: 9.7 mg/dL (ref 8.7–10.2)
Chloride: 101 mmol/L (ref 97–108)
Creatinine, Ser: 0.92 mg/dL (ref 0.76–1.27)
GFR calc Af Amer: 131 mL/min/{1.73_m2} (ref >59)
GFR calc non Af Amer: 114 mL/min/{1.73_m2} (ref >59)
Globulin, Total: 2.2 g/dL (ref 1.5–4.5)
Glucose: 92 mg/dL (ref 65–99)
Potassium: 4.5 mmol/L (ref 3.5–5.2)
Sodium: 141 mmol/L (ref 134–144)
Total Bilirubin: 0.7 mg/dL (ref 0.0–1.2)
Total Protein: 6.6 g/dL (ref 6.0–8.5)

## 2013-08-20 LAB — THYROID PANEL WITH TSH
Free Thyroxine Index: 1.7 (ref 1.2–4.9)
T3 Uptake Ratio: 33 % (ref 24–39)
T4, Total: 5.3 ug/dL (ref 4.5–12.0)
TSH: 1.89 u[IU]/mL (ref 0.450–4.500)

## 2013-08-31 ENCOUNTER — Telehealth: Payer: Self-pay | Admitting: Family Medicine

## 2013-09-02 NOTE — Telephone Encounter (Signed)
LEFT MESSAGE FOR PT TO CALL ABOUT LABS

## 2013-09-12 ENCOUNTER — Encounter (HOSPITAL_BASED_OUTPATIENT_CLINIC_OR_DEPARTMENT_OTHER): Payer: Self-pay | Admitting: Emergency Medicine

## 2013-09-12 ENCOUNTER — Emergency Department (HOSPITAL_BASED_OUTPATIENT_CLINIC_OR_DEPARTMENT_OTHER): Payer: Medicaid Other

## 2013-09-12 ENCOUNTER — Emergency Department (HOSPITAL_BASED_OUTPATIENT_CLINIC_OR_DEPARTMENT_OTHER)
Admission: EM | Admit: 2013-09-12 | Discharge: 2013-09-12 | Disposition: A | Discharge status: Home / Self Care | Payer: Medicaid Other | Attending: Emergency Medicine | Admitting: Emergency Medicine

## 2013-09-12 DIAGNOSIS — S93402A Sprain of unspecified ligament of left ankle, initial encounter: Secondary | ICD-10-CM

## 2013-09-12 DIAGNOSIS — Z9889 Other specified postprocedural states: Secondary | ICD-10-CM | POA: Insufficient documentation

## 2013-09-12 DIAGNOSIS — Z87891 Personal history of nicotine dependence: Secondary | ICD-10-CM | POA: Insufficient documentation

## 2013-09-12 DIAGNOSIS — Z8614 Personal history of Methicillin resistant Staphylococcus aureus infection: Secondary | ICD-10-CM | POA: Insufficient documentation

## 2013-09-12 DIAGNOSIS — Y9389 Activity, other specified: Secondary | ICD-10-CM | POA: Insufficient documentation

## 2013-09-12 DIAGNOSIS — Z8711 Personal history of peptic ulcer disease: Secondary | ICD-10-CM | POA: Insufficient documentation

## 2013-09-12 DIAGNOSIS — E7800 Pure hypercholesterolemia, unspecified: Secondary | ICD-10-CM | POA: Insufficient documentation

## 2013-09-12 DIAGNOSIS — Z79899 Other long term (current) drug therapy: Secondary | ICD-10-CM | POA: Insufficient documentation

## 2013-09-12 DIAGNOSIS — W172XXA Fall into hole, initial encounter: Secondary | ICD-10-CM | POA: Insufficient documentation

## 2013-09-12 DIAGNOSIS — S93409A Sprain of unspecified ligament of unspecified ankle, initial encounter: Secondary | ICD-10-CM | POA: Insufficient documentation

## 2013-09-12 DIAGNOSIS — Z791 Long term (current) use of non-steroidal anti-inflammatories (NSAID): Secondary | ICD-10-CM | POA: Insufficient documentation

## 2013-09-12 DIAGNOSIS — Y9289 Other specified places as the place of occurrence of the external cause: Secondary | ICD-10-CM | POA: Insufficient documentation

## 2013-09-12 DIAGNOSIS — E669 Obesity, unspecified: Secondary | ICD-10-CM | POA: Insufficient documentation

## 2013-09-12 DIAGNOSIS — K219 Gastro-esophageal reflux disease without esophagitis: Secondary | ICD-10-CM | POA: Insufficient documentation

## 2013-09-12 DIAGNOSIS — F319 Bipolar disorder, unspecified: Secondary | ICD-10-CM | POA: Insufficient documentation

## 2013-09-12 MED ORDER — HYDROCODONE-ACETAMINOPHEN 5-325 MG PO TABS: 2.0000 | ORAL_TABLET | ORAL | Status: DC | PRN

## 2013-09-12 MED ORDER — HYDROCODONE-ACETAMINOPHEN 5-325 MG PO TABS
2.0000 | ORAL_TABLET | Freq: Once | ORAL | Status: AC
  Administered 2013-09-12: 2 via ORAL
  Filled 2013-09-12: qty 2

## 2013-09-12 NOTE — ED Notes (Signed)
Fell last night after stepping in a hole in the ground.  Injured left ankle.  Fell against the tongue of a trailer and injured his lower back.  Denies numbness, tingling to extremities.

## 2013-09-12 NOTE — ED Provider Notes (Signed)
CSN: [id number]     Arrival date & time 09/12/13  1432 History   First MD Initiated Contact with Patient 09/12/13 1514     Chief Complaint  Patient presents with  . Fall  . Ankle Pain   (Consider location/radiation/quality/duration/timing/severity/associated sxs/prior Treatment) Patient is a 27 y.o. male presenting with fall and ankle pain. The history is provided by the patient. No language interpreter was used.  Fall This is a new problem. The current episode started yesterday. The problem occurs constantly. The problem has been rapidly worsening. Pertinent negatives include no numbness. Nothing aggravates the symptoms. He has tried nothing for the symptoms. The treatment provided no relief.  Ankle Pain Pt reports he fell last pm and turned his ankle.   Pt complains of swelling and   Past Medical History  Diagnosis Date  . Tobacco dipper   . GERD (gastroesophageal reflux disease)   . ADHD (attention deficit hyperactivity disorder)   . Hx MRSA infection 8/08    right thigh  . S/P colonoscopy 11/10, 10/08    Dr Elmer Ramp  . S/P endoscopy 10/08, 2/10, 5/10    Dr Darnelle Going reflux esophagitis, small HH  . Occult GI bleeding 12/2008    Trivial upper GI bleed/uncontrolled GERD by upper endoscopy 12/2008, normal f/u endoscopy 03/2009 with [facility] at that time  . Bipolar 1 disorder   . Esophagitis     Distal esophageal erosions consistent with mild erosive  reflux esophagitis 12/2008 by EGD   . Thyroid function test abnormal     Noted in 2011 discharge  . Obesity   . Gastric ulcer   . Hypercholesteremia    Past Surgical History  Procedure Laterality Date  . Vasectomy    . Ankle surgery    . Small bowel capsule  04/11/2009     normal throughout  . Esophagogastroduodenoscopy   04/07/2009    Normal esophagus, small hiatal hernia  . Esophagogastroduodenoscopy  01/09/2009    Distal esophageal erosions consistent with mild erosive reflux esophagitis, otherwise normal esophagus,  small hiatal herniaotherwise normal stomach, D1-D2   . Ileocolonoscopy  08/26/2007     A normal rectum, colon, and terminal ileum  . Esophagogastroduodenoscopy  08/26/2007    Normal esophagus, a small hiatal/hernia, otherwise normal stomach D1 through D3  . Colonoscopy  10/10/2009    anal papilla otherwise normal   Family History  Problem Relation Age of Onset  . Leukemia Father 34  . Seizures Mother   . Bipolar disorder Brother   . ADD / ADHD Brother    History  Substance Use Topics  . Smoking status: Never Smoker   . Smokeless tobacco: Current User    Types: Chew     Comment: DIPx 10 years  . Alcohol Use: Yes    Review of Systems  Skin: Positive for wound.  Neurological: Negative for numbness.  All other systems reviewed and are negative.    Allergies  Tylenol  Home Medications   Current Outpatient Rx  Name  Route  Sig  Dispense  Refill  . diclofenac sodium (VOLTAREN) 1 % GEL   Topical   Apply 2 g topically 4 (four) times daily.   100 g   11   . fluconazole (DIFLUCAN) 150 MG tablet      Take one po qd x 7 days and then stay off one week and then repeat.   7 tablet   1   . FLUoxetine (PROZAC) 40 MG capsule   Oral   Take  1 capsule (40 mg total) by mouth daily.   30 capsule   11   . nystatin (MYCOSTATIN/NYSTOP) 100000 UNIT/GM POWD   Topical   Apply 1 Bottle topically 2 (two) times daily.   30 g   5   . OLANZapine (ZYPREXA) 10 MG tablet   Oral   Take 1 tablet (10 mg total) by mouth at bedtime.   30 tablet   11   . pantoprazole (PROTONIX) 40 MG tablet   Oral   Take 1 tablet (40 mg total) by mouth 2 (two) times daily before a meal.   62 tablet   11   . pravastatin (PRAVACHOL) 40 MG tablet   Oral   Take 1 tablet (40 mg total) by mouth daily.   30 tablet   2    BP 140/80  Pulse 88  Temp(Src) 97.8 F (36.6 C) (Oral)  Resp 20  Ht 6' (1.829 m)  Wt 405 lb (183.707 kg)  BMI 54.92 kg/m2  SpO2 100% Physical Exam  Nursing note and vitals  reviewed. Constitutional: He appears well-developed and well-nourished.  HENT:  Head: Normocephalic.  Eyes: Pupils are equal, round, and reactive to light.  Cardiovascular: Normal rate.   Pulmonary/Chest: Effort normal.  Musculoskeletal: He exhibits tenderness.  Swollen left ankle nv and ns intact  Neurological: He is alert.  Skin: Skin is warm.  Psychiatric: He has a normal mood and affect.    ED Course  Procedures (including critical care time) Labs Review Labs Reviewed - No data to display Imaging Review Dg Ankle Complete Left  09/12/2013   CLINICAL DATA:  Injury  EXAM: LEFT ANKLE COMPLETE - 3+ VIEW  COMPARISON:  07/15/2012  FINDINGS: No acute fracture. No dislocation. Moderate degenerative changes of the ankle joint.  IMPRESSION: No acute bony pathology.   Electronically Signed   By: Maryclare Bean M.D.   On: 09/12/2013 16:09    EKG Interpretation   None       MDM   1. Ankle sprain, left, initial encounter     aso and crutches.     Elson Areas, PA-C 09/12/13 1704  Elson Areas, PA-C 09/12/13 1704

## 2013-09-12 NOTE — ED Provider Notes (Signed)
Medical screening examination/treatment/procedure(s) were performed by non-physician practitioner and as supervising physician I was immediately available for consultation/collaboration.  Doug Sou, MD 09/12/13 2330

## 2013-09-20 ENCOUNTER — Ambulatory Visit (INDEPENDENT_AMBULATORY_CARE_PROVIDER_SITE_OTHER): Payer: Medicaid Other | Admitting: General Practice

## 2013-09-20 VITALS — BP 142/90 | HR 66 | Temp 96.9°F | Ht 72.0 in | Wt >= 6400 oz

## 2013-09-20 DIAGNOSIS — M25579 Pain in unspecified ankle and joints of unspecified foot: Secondary | ICD-10-CM

## 2013-09-20 DIAGNOSIS — M25572 Pain in left ankle and joints of left foot: Secondary | ICD-10-CM

## 2013-09-20 MED ORDER — TRAMADOL HCL 50 MG PO TABS: 50.0000 mg | ORAL_TABLET | Freq: Four times a day (QID) | ORAL | Status: DC | PRN

## 2013-09-20 NOTE — Patient Instructions (Signed)
.  Ankle Pain  Ankle pain is a common symptom. The bones, cartilage, tendons, and muscles of the ankle joint perform a lot of work each day. The ankle joint holds your body weight and allows you to move around. Ankle pain can occur on either side or back of 1 or both ankles. Ankle pain may be sharp and burning or dull and aching. There may be tenderness, stiffness, redness, or warmth around the ankle. The pain occurs more often when a person walks or puts pressure on the ankle.  CAUSES   There are many reasons ankle pain can develop. It is important to work with your caregiver to identify the cause since many conditions can impact the bones, cartilage, muscles, and tendons. Causes for ankle pain include:  · Injury, including a break (fracture), sprain, or strain often due to a fall, sports, or a high-impact activity.  · Swelling (inflammation) of a tendon (tendonitis).  · Achilles tendon rupture.  · Ankle instability after repeated sprains and strains.  · Poor foot alignment.  · Pressure on a nerve (tarsal tunnel syndrome).  · Arthritis in the ankle or the lining of the ankle.  · Crystal formation in the ankle (gout or pseudogout).  DIAGNOSIS   A diagnosis is based on your medical history, your symptoms, results of your physical exam, and results of diagnostic tests. Diagnostic tests may include X-ray exams or a computerized magnetic scan (magnetic resonance imaging, MRI).  TREATMENT   Treatment will depend on the cause of your ankle pain and may include:  · Keeping pressure off the ankle and limiting activities.  · Using crutches or other walking support (a cane or brace).  · Using rest, ice, compression, and elevation.  · Participating in physical therapy or home exercises.  · Wearing shoe inserts or special shoes.  · Losing weight.  · Taking medications to reduce pain or swelling or receiving an injection.  · Undergoing surgery.  HOME CARE INSTRUCTIONS   · Only take over-the-counter or prescription medicines for  pain, discomfort, or fever as directed by your caregiver.  · Put ice on the injured area.  · Put ice in a plastic bag.  · Place a towel between your skin and the bag.  · Leave the ice on for 15-20 minutes at a time, 3-4 times a day.  · Keep your leg raised (elevated) when possible to lessen swelling.  · Avoid activities that cause ankle pain.  · Follow specific exercises as directed by your caregiver.  · Record how often you have ankle pain, the location of the pain, and what it feels like. This information may be helpful to you and your caregiver.  · Ask your caregiver about returning to work or sports and whether you should drive.  · Follow up with your caregiver for further examination, therapy, or testing as directed.  SEEK MEDICAL CARE IF:   · Pain or swelling continues or worsens beyond 1 week.  · You have an oral temperature above 102° F (38.9° C).  · You are feeling unwell or have chills.  · You are having an increasingly difficult time with walking.  · You have loss of sensation or other new symptoms.  · You have questions or concerns.  MAKE SURE YOU:   · Understand these instructions.  · Will watch your condition.  · Will get help right away if you are not doing well or get worse.  Document Released: 05/01/2010 Document Revised: 02/03/2012 Document Reviewed: 05/01/2010    [employer]® Patient Information ©2014 [employer].

## 2013-09-20 NOTE — Progress Notes (Signed)
  Subjective:    Patient ID: [name], male    DOB: 06/19/1986, 27 y.o.   MRN: [id number]  Ankle Pain  The incident occurred more than 1 week ago. The incident occurred at home. The injury mechanism was a twisting injury. The pain is present in the left ankle. The quality of the pain is described as aching. The pain is at a severity of 6/10. The pain is moderate. The pain has been constant since onset. Associated symptoms include muscle weakness. Pertinent negatives include no inability to bear weight, loss of sensation, numbness or tingling. He reports no foreign bodies present. The symptoms are aggravated by movement, weight bearing and palpation. He has tried acetaminophen, ice, elevation, heat and rest for the symptoms. The treatment provided no relief.  Left ankle xray from 09/12/13 results, negative for fracture or dislocation. Showed moderate degenerative changes.     Review of Systems  Constitutional: Negative for fever and chills.  Respiratory: Negative for chest tightness and shortness of breath.   Cardiovascular: Negative for chest pain and palpitations.  Musculoskeletal: Negative for back pain and neck pain.       Left ankle pain  Neurological: Negative for tingling and numbness.       Objective:   Physical Exam  Constitutional: He is oriented to person, place, and time. He appears well-developed and well-nourished.  Cardiovascular: Normal rate, regular rhythm and normal heart sounds.   Pulmonary/Chest: Effort normal and breath sounds normal. No respiratory distress. He exhibits no tenderness.  Musculoskeletal: He exhibits edema and tenderness.  Left ankle pain and tenderness with palpation and weight bearing  Neurological: He is alert and oriented to person, place, and time.  Skin: Skin is warm and dry.  Psychiatric: He has a normal mood and affect.          Assessment & Plan:  1. Left ankle pain  - traMADol (ULTRAM) 50 MG tablet; Take 1 tablet (50 mg total)  by mouth every 6 (six) hours as needed for pain.  Dispense: 15 tablet; Refill: 0 -continue treatment plan from urgent care (RICE instructions) -appointment scheduled with [facility] ortho for tomorrow at 2:15 -Patient aware and in agreement with appointment date and time -Coralie Keens, FNP-C

## 2014-01-28 ENCOUNTER — Emergency Department (HOSPITAL_BASED_OUTPATIENT_CLINIC_OR_DEPARTMENT_OTHER)
Admission: EM | Admit: 2014-01-28 | Discharge: 2014-01-28 | Disposition: A | Discharge status: Home / Self Care | Payer: Medicaid Other | Attending: Emergency Medicine | Admitting: Emergency Medicine

## 2014-01-28 ENCOUNTER — Encounter (HOSPITAL_BASED_OUTPATIENT_CLINIC_OR_DEPARTMENT_OTHER): Payer: Self-pay | Admitting: Emergency Medicine

## 2014-01-28 ENCOUNTER — Emergency Department (HOSPITAL_BASED_OUTPATIENT_CLINIC_OR_DEPARTMENT_OTHER): Payer: Medicaid Other

## 2014-01-28 DIAGNOSIS — W010XXA Fall on same level from slipping, tripping and stumbling without subsequent striking against object, initial encounter: Secondary | ICD-10-CM | POA: Insufficient documentation

## 2014-01-28 DIAGNOSIS — W19XXXA Unspecified fall, initial encounter: Secondary | ICD-10-CM

## 2014-01-28 DIAGNOSIS — Y939 Activity, unspecified: Secondary | ICD-10-CM | POA: Insufficient documentation

## 2014-01-28 DIAGNOSIS — F319 Bipolar disorder, unspecified: Secondary | ICD-10-CM | POA: Insufficient documentation

## 2014-01-28 DIAGNOSIS — Z8614 Personal history of Methicillin resistant Staphylococcus aureus infection: Secondary | ICD-10-CM | POA: Insufficient documentation

## 2014-01-28 DIAGNOSIS — Z791 Long term (current) use of non-steroidal anti-inflammatories (NSAID): Secondary | ICD-10-CM | POA: Insufficient documentation

## 2014-01-28 DIAGNOSIS — S300XXA Contusion of lower back and pelvis, initial encounter: Secondary | ICD-10-CM

## 2014-01-28 DIAGNOSIS — S8990XA Unspecified injury of unspecified lower leg, initial encounter: Secondary | ICD-10-CM | POA: Insufficient documentation

## 2014-01-28 DIAGNOSIS — M25572 Pain in left ankle and joints of left foot: Secondary | ICD-10-CM

## 2014-01-28 DIAGNOSIS — S99919A Unspecified injury of unspecified ankle, initial encounter: Secondary | ICD-10-CM

## 2014-01-28 DIAGNOSIS — S99929A Unspecified injury of unspecified foot, initial encounter: Secondary | ICD-10-CM

## 2014-01-28 DIAGNOSIS — S20229A Contusion of unspecified back wall of thorax, initial encounter: Secondary | ICD-10-CM | POA: Insufficient documentation

## 2014-01-28 DIAGNOSIS — Y92009 Unspecified place in unspecified non-institutional (private) residence as the place of occurrence of the external cause: Secondary | ICD-10-CM | POA: Insufficient documentation

## 2014-01-28 DIAGNOSIS — F17200 Nicotine dependence, unspecified, uncomplicated: Secondary | ICD-10-CM | POA: Insufficient documentation

## 2014-01-28 DIAGNOSIS — Z9889 Other specified postprocedural states: Secondary | ICD-10-CM | POA: Insufficient documentation

## 2014-01-28 DIAGNOSIS — E7800 Pure hypercholesterolemia, unspecified: Secondary | ICD-10-CM | POA: Insufficient documentation

## 2014-01-28 DIAGNOSIS — Z79899 Other long term (current) drug therapy: Secondary | ICD-10-CM | POA: Insufficient documentation

## 2014-01-28 DIAGNOSIS — S99912A Unspecified injury of left ankle, initial encounter: Secondary | ICD-10-CM

## 2014-01-28 DIAGNOSIS — K219 Gastro-esophageal reflux disease without esophagitis: Secondary | ICD-10-CM | POA: Insufficient documentation

## 2014-01-28 DIAGNOSIS — E669 Obesity, unspecified: Secondary | ICD-10-CM | POA: Insufficient documentation

## 2014-01-28 MED ORDER — OXYCODONE HCL 5 MG PO TABS
5.0000 mg | ORAL_TABLET | Freq: Once | ORAL | Status: AC
  Administered 2014-01-28: 5 mg via ORAL
  Filled 2014-01-28: qty 1

## 2014-01-28 MED ORDER — TRAMADOL HCL 50 MG PO TABS: 50.0000 mg | ORAL_TABLET | Freq: Four times a day (QID) | ORAL | Status: DC | PRN

## 2014-01-28 NOTE — ED Notes (Signed)
Patient transported to X-ray 

## 2014-01-28 NOTE — ED Notes (Signed)
Fell on steps at home at 5am-pain to lower back and left ankle-steady gait into triage

## 2014-01-28 NOTE — ED Notes (Signed)
MD Wofford at bedside.  

## 2014-01-28 NOTE — ED Provider Notes (Signed)
CSN: [id number]     Arrival date & time 01/28/14  1531 History   First MD Initiated Contact with Patient 01/28/14 1550     Chief Complaint  Patient presents with  . Fall     (Consider location/radiation/quality/duration/timing/severity/associated sxs/prior Treatment) Patient is a 28 y.o. male presenting with fall.  Fall This is a new problem. Episode onset: 13 hours ago. Episode frequency: once. Pertinent negatives include no chest pain, no abdominal pain, no headaches and no shortness of breath. The symptoms are aggravated by walking and twisting. Nothing relieves the symptoms. Treatments tried: ibuprofen, tylenol. The treatment provided no relief.    Past Medical History  Diagnosis Date  . Tobacco dipper   . GERD (gastroesophageal reflux disease)   . ADHD (attention deficit hyperactivity disorder)   . Hx MRSA infection 8/08    right thigh  . S/P colonoscopy 11/10, 10/08    Dr [name]  . S/P endoscopy 10/08, 2/10, 5/10    Dr Raliegh Scarlet reflux esophagitis, small HH  . Occult GI bleeding 12/2008    Trivial upper GI bleed/uncontrolled GERD by upper endoscopy 12/2008, normal f/u endoscopy 03/2009 with SBCE at that time  . Bipolar 1 disorder   . Esophagitis     Distal esophageal erosions consistent with mild erosive  reflux esophagitis 12/2008 by EGD   . Thyroid function test abnormal     Noted in 2011 discharge  . Obesity   . Gastric ulcer   . Hypercholesteremia    Past Surgical History  Procedure Laterality Date  . Vasectomy    . Ankle surgery    . Small bowel capsule  04/11/2009     normal throughout  . Esophagogastroduodenoscopy   04/07/2009    Normal esophagus, small hiatal hernia  . Esophagogastroduodenoscopy  01/09/2009    Distal esophageal erosions consistent with mild erosive reflux esophagitis, otherwise normal esophagus, small hiatal herniaotherwise normal stomach, D1-D2   . Ileocolonoscopy  08/26/2007     A normal rectum, colon, and terminal ileum  .  Esophagogastroduodenoscopy  08/26/2007    Normal esophagus, a small hiatal/hernia, otherwise normal stomach D1 through D3  . Colonoscopy  10/10/2009    anal papilla otherwise normal   Family History  Problem Relation Age of Onset  . Leukemia Father 71  . Seizures Mother   . Bipolar disorder Brother   . ADD / ADHD Brother    History  Substance Use Topics  . Smoking status: Never Smoker   . Smokeless tobacco: Current User    Types: Chew     Comment: DIPx 10 years  . Alcohol Use: Yes    Review of Systems  Respiratory: Negative for shortness of breath.   Cardiovascular: Negative for chest pain.  Gastrointestinal: Negative for abdominal pain.  Neurological: Negative for headaches.  All other systems reviewed and are negative.      Allergies  Tylenol  Home Medications   Current Outpatient Rx  Name  Route  Sig  Dispense  Refill  . diclofenac sodium (VOLTAREN) 1 % GEL   Topical   Apply 2 g topically 4 (four) times daily.   100 g   11   . FLUoxetine (PROZAC) 40 MG capsule   Oral   Take 1 capsule (40 mg total) by mouth daily.   30 capsule   11   . nystatin (MYCOSTATIN/NYSTOP) 100000 UNIT/GM POWD   Topical   Apply 1 Bottle topically 2 (two) times daily.   30 g   5   .  OLANZapine (ZYPREXA) 10 MG tablet   Oral   Take 1 tablet (10 mg total) by mouth at bedtime.   30 tablet   11   . pantoprazole (PROTONIX) 40 MG tablet   Oral   Take 1 tablet (40 mg total) by mouth 2 (two) times daily before a meal.   62 tablet   11   . pravastatin (PRAVACHOL) 40 MG tablet   Oral   Take 1 tablet (40 mg total) by mouth daily.   30 tablet   2   . traMADol (ULTRAM) 50 MG tablet   Oral   Take 1 tablet (50 mg total) by mouth every 6 (six) hours as needed for pain.   15 tablet   0    BP 151/97  Pulse 104  Temp(Src) 97.8 F (36.6 C) (Oral)  Resp 20  Ht 6' (1.829 m)  Wt 390 lb (176.903 kg)  BMI 52.88 kg/m2  SpO2 98% Physical Exam  Nursing note and vitals  reviewed. Constitutional: He is oriented to person, place, and time. He appears well-developed and well-nourished. No distress.  HENT:  Head: Normocephalic and atraumatic. Head is without raccoon's eyes and without Battle's sign.  Nose: Nose normal.  Mouth/Throat: Oropharynx is clear and moist.  Eyes: Conjunctivae and EOM are normal. Pupils are equal, round, and reactive to light. No scleral icterus.  Neck: Neck supple. No spinous process tenderness and no muscular tenderness present.  Cardiovascular: Normal rate, regular rhythm, normal heart sounds and intact distal pulses.   No murmur heard. Pulmonary/Chest: Effort normal and breath sounds normal. No stridor. No respiratory distress. He has no wheezes. He has no rales. He exhibits no tenderness.  Abdominal: Soft. He exhibits no distension. There is no tenderness. There is no rebound and no guarding.  Musculoskeletal: Normal range of motion. He exhibits no edema.       Left ankle: He exhibits normal range of motion, no swelling, no ecchymosis, no deformity and normal pulse. Tenderness. Lateral malleolus tenderness found.       Thoracic back: He exhibits no tenderness and no bony tenderness.       Lumbar back: He exhibits tenderness and bony tenderness.       Back:       Arms: No evidence of trauma to extremities, except as noted.  2+ distal pulses.    Neurological: He is alert and oriented to person, place, and time.  Skin: Skin is warm and dry. No rash noted.  Psychiatric: He has a normal mood and affect. His behavior is normal.    ED Course  Procedures (including critical care time) Labs Review Labs Reviewed - No data to display Imaging Review Dg Lumbar Spine 2-3 Views  01/28/2014   CLINICAL DATA:  Lower back pain after fall.  EXAM: LUMBAR SPINE - 2-3 VIEW  COMPARISON:  None.  FINDINGS: There is no evidence of lumbar spine fracture. Alignment is normal. Intervertebral disc spaces are maintained.  IMPRESSION: Normal lumbar spine.    Electronically Signed   By: Sabino Dick M.D.   On: 01/28/2014 16:49   Dg Ankle Complete Left  01/28/2014   CLINICAL DATA:  Fall  EXAM: LEFT ANKLE COMPLETE - 3+ VIEW  COMPARISON:  09/12/2013  FINDINGS: Three views of left ankle submitted. No acute fracture or subluxation. Spurring of distal tibia medial malleolus again noted. Ankle mortise is preserved.  IMPRESSION: Negative.   Electronically Signed   By: Lahoma Crocker M.D.   On: 01/28/2014 16:48  All  radiology studies independently viewed by me.      EKG Interpretation None      MDM   Final diagnoses:  Fall  Contusion of lower back  Left ankle injury    Slipped on ice and landed on stairs about 12 hours ago.  Pain in low back and left ankle.  Lower extremities are neurologically intact.  Plain films pending.  Oxycodone for pain.   Plain films negative.  Will dc.  He doesn't want crutches.    Houston Siren III, MD 01/28/14 [phone]

## 2014-01-28 NOTE — Discharge Instructions (Signed)

## 2014-01-28 NOTE — ED Notes (Signed)
Pt returned from xray via stretcher.

## 2014-02-12 ENCOUNTER — Encounter (HOSPITAL_BASED_OUTPATIENT_CLINIC_OR_DEPARTMENT_OTHER): Payer: Self-pay | Admitting: Emergency Medicine

## 2014-02-12 ENCOUNTER — Emergency Department (HOSPITAL_BASED_OUTPATIENT_CLINIC_OR_DEPARTMENT_OTHER): Payer: Medicaid Other

## 2014-02-12 ENCOUNTER — Emergency Department (HOSPITAL_BASED_OUTPATIENT_CLINIC_OR_DEPARTMENT_OTHER)
Admission: EM | Admit: 2014-02-12 | Discharge: 2014-02-12 | Disposition: A | Discharge status: Home / Self Care | Payer: Medicaid Other | Attending: Emergency Medicine | Admitting: Emergency Medicine

## 2014-02-12 DIAGNOSIS — Z791 Long term (current) use of non-steroidal anti-inflammatories (NSAID): Secondary | ICD-10-CM | POA: Insufficient documentation

## 2014-02-12 DIAGNOSIS — E669 Obesity, unspecified: Secondary | ICD-10-CM | POA: Insufficient documentation

## 2014-02-12 DIAGNOSIS — K259 Gastric ulcer, unspecified as acute or chronic, without hemorrhage or perforation: Secondary | ICD-10-CM | POA: Insufficient documentation

## 2014-02-12 DIAGNOSIS — E7800 Pure hypercholesterolemia, unspecified: Secondary | ICD-10-CM | POA: Insufficient documentation

## 2014-02-12 DIAGNOSIS — S3994XA Unspecified injury of external genitals, initial encounter: Secondary | ICD-10-CM | POA: Insufficient documentation

## 2014-02-12 DIAGNOSIS — Z8614 Personal history of Methicillin resistant Staphylococcus aureus infection: Secondary | ICD-10-CM | POA: Insufficient documentation

## 2014-02-12 DIAGNOSIS — S335XXA Sprain of ligaments of lumbar spine, initial encounter: Secondary | ICD-10-CM | POA: Insufficient documentation

## 2014-02-12 DIAGNOSIS — Z79899 Other long term (current) drug therapy: Secondary | ICD-10-CM | POA: Insufficient documentation

## 2014-02-12 DIAGNOSIS — S39012A Strain of muscle, fascia and tendon of lower back, initial encounter: Secondary | ICD-10-CM

## 2014-02-12 DIAGNOSIS — IMO0002 Reserved for concepts with insufficient information to code with codable children: Secondary | ICD-10-CM

## 2014-02-12 DIAGNOSIS — Y929 Unspecified place or not applicable: Secondary | ICD-10-CM | POA: Insufficient documentation

## 2014-02-12 DIAGNOSIS — S39848A Other specified injuries of external genitals, initial encounter: Secondary | ICD-10-CM | POA: Insufficient documentation

## 2014-02-12 DIAGNOSIS — F319 Bipolar disorder, unspecified: Secondary | ICD-10-CM | POA: Insufficient documentation

## 2014-02-12 DIAGNOSIS — X500XXA Overexertion from strenuous movement or load, initial encounter: Secondary | ICD-10-CM | POA: Insufficient documentation

## 2014-02-12 DIAGNOSIS — K219 Gastro-esophageal reflux disease without esophagitis: Secondary | ICD-10-CM | POA: Insufficient documentation

## 2014-02-12 DIAGNOSIS — Y9389 Activity, other specified: Secondary | ICD-10-CM | POA: Insufficient documentation

## 2014-02-12 MED ORDER — IBUPROFEN 800 MG PO TABS
800.0000 mg | ORAL_TABLET | Freq: Once | ORAL | Status: AC
  Administered 2014-02-12: 800 mg via ORAL
  Filled 2014-02-12: qty 1

## 2014-02-12 MED ORDER — TRAMADOL HCL 50 MG PO TABS
50.0000 mg | ORAL_TABLET | Freq: Once | ORAL | Status: AC
  Administered 2014-02-12: 50 mg via ORAL
  Filled 2014-02-12: qty 1

## 2014-02-12 MED ORDER — TRAMADOL HCL 50 MG PO TABS: 50.0000 mg | ORAL_TABLET | Freq: Four times a day (QID) | ORAL | Status: DC | PRN

## 2014-02-12 NOTE — ED Notes (Signed)
Pt reports low back pain and groin pain after lifting appliance

## 2014-02-12 NOTE — ED Provider Notes (Addendum)
CSN: [id number]     Arrival date & time 02/12/14  2010 History  This chart was scribed for Shaune Pollack, MD by Maree Erie, ED Scribe. The patient was seen in room MH06/MH06. Patient's care was started at 10:00 PM.    Chief Complaint  Patient presents with  . Groin Pain  . Back Pain     Patient is a 28 y.o. male presenting with groin pain and back pain. The history is provided by the patient. No language interpreter was used.  Groin Pain This is a new problem. The current episode started 6 to 12 hours ago. The problem occurs constantly. The problem has not changed since onset.Nothing relieves the symptoms. Treatments tried: ibuprofen. The treatment provided no relief.  Back Pain   HPI Comments: [name] is a 28 y.o. male who presents to the Emergency Department complaining of constant lower back pain that radiates into his bilateral groin and began eight hours ago. He states that the pain appeared suddenly after straining to lift a refrigerator. He denies falling. He has taken ibuprofen for the pain without relief.     Past Medical History  Diagnosis Date  . Tobacco dipper   . GERD (gastroesophageal reflux disease)   . ADHD (attention deficit hyperactivity disorder)   . Hx MRSA infection 8/08    right thigh  . S/P colonoscopy 11/10, 10/08    Dr [name]  . S/P endoscopy 10/08, 2/10, 5/10    Dr Raliegh Scarlet reflux esophagitis, small HH  . Occult GI bleeding 12/2008    Trivial upper GI bleed/uncontrolled GERD by upper endoscopy 12/2008, normal f/u endoscopy 03/2009 with SBCE at that time  . Bipolar 1 disorder   . Esophagitis     Distal esophageal erosions consistent with mild erosive  reflux esophagitis 12/2008 by EGD   . Thyroid function test abnormal     Noted in 2011 discharge  . Obesity   . Gastric ulcer   . Hypercholesteremia    Past Surgical History  Procedure Laterality Date  . Vasectomy    . Ankle surgery    . Small bowel capsule  04/11/2009   normal throughout  . Esophagogastroduodenoscopy   04/07/2009    Normal esophagus, small hiatal hernia  . Esophagogastroduodenoscopy  01/09/2009    Distal esophageal erosions consistent with mild erosive reflux esophagitis, otherwise normal esophagus, small hiatal herniaotherwise normal stomach, D1-D2   . Ileocolonoscopy  08/26/2007     A normal rectum, colon, and terminal ileum  . Esophagogastroduodenoscopy  08/26/2007    Normal esophagus, a small hiatal/hernia, otherwise normal stomach D1 through D3  . Colonoscopy  10/10/2009    anal papilla otherwise normal   Family History  Problem Relation Age of Onset  . Leukemia Father 49  . Seizures Mother   . Bipolar disorder Brother   . ADD / ADHD Brother    History  Substance Use Topics  . Smoking status: Never Smoker   . Smokeless tobacco: Current User    Types: Chew     Comment: DIPx 10 years  . Alcohol Use: Yes    Review of Systems  Genitourinary: Positive for testicular pain.  Musculoskeletal: Positive for back pain.  All other systems reviewed and are negative.      Allergies  Tylenol  Home Medications   Current Outpatient Rx  Name  Route  Sig  Dispense  Refill  . diclofenac sodium (VOLTAREN) 1 % GEL   Topical   Apply 2 g topically 4 (  four) times daily.   100 g   11   . FLUoxetine (PROZAC) 40 MG capsule   Oral   Take 1 capsule (40 mg total) by mouth daily.   30 capsule   11   . nystatin (MYCOSTATIN/NYSTOP) 100000 UNIT/GM POWD   Topical   Apply 1 Bottle topically 2 (two) times daily.   30 g   5   . OLANZapine (ZYPREXA) 10 MG tablet   Oral   Take 1 tablet (10 mg total) by mouth at bedtime.   30 tablet   11   . pantoprazole (PROTONIX) 40 MG tablet   Oral   Take 1 tablet (40 mg total) by mouth 2 (two) times daily before a meal.   62 tablet   11   . pravastatin (PRAVACHOL) 40 MG tablet   Oral   Take 1 tablet (40 mg total) by mouth daily.   30 tablet   2   . traMADol (ULTRAM) 50 MG tablet    Oral   Take 1 tablet (50 mg total) by mouth every 6 (six) hours as needed.   10 tablet   0    Triage Vitals: BP 122/74  Pulse 100  Temp(Src) 98.1 F (36.7 C) (Oral)  SpO2 97%  Physical Exam  Nursing note and vitals reviewed. Constitutional: He is oriented to person, place, and time. He appears well-developed and well-nourished. No distress.  HENT:  Head: Normocephalic and atraumatic.  Eyes: Conjunctivae and EOM are normal. Pupils are equal, round, and reactive to light.  Neck: Neck supple. No tracheal deviation present.  Cardiovascular: Normal rate.   Pulmonary/Chest: Effort normal. No respiratory distress.  Abdominal: Soft. He exhibits no distension. There is no tenderness. There is no rebound and no guarding.  Genitourinary: Testes normal and penis normal.  Musculoskeletal: Normal range of motion.  Neurological: He is alert and oriented to person, place, and time.  Skin: Skin is warm and dry.  Psychiatric: He has a normal mood and affect. His behavior is normal.    ED Course  Procedures (including critical care time)  DIAGNOSTIC STUDIES: Oxygen Saturation is 97% on room air, adequate by my interpretation.    COORDINATION OF CARE: 10:05 PM -Will review radiology results to determine treatment plan. Patient verbalizes understanding and agrees with treatment plan.    Labs Review Labs Reviewed - No data to display Imaging Review US Scrotum  02/12/2014   CLINICAL DATA:  28 year old male with bilateral scrotal pain.  EXAM: SCROTAL ULTRASOUND  DOPPLER ULTRASOUND OF THE TESTICLES  TECHNIQUE: Complete ultrasound examination of the testicles, epididymis, and other scrotal structures was performed. Color and spectral Doppler ultrasound were also utilized to evaluate blood flow to the testicles.  COMPARISON:  03/01/2010 ultrasound  FINDINGS: Right testicle  Measurements: 4.9 x 2.3 x 3.5 cm. No mass or microlithiasis visualized.  Left testicle  Measurements: 4.9 x 2.9 x 3.7 cm. A 2.4  mm ill-defined slightly hyperechoic area within the mid- lower left testicle is indeterminate but most likely of little clinical significance. No other testicular abnormalities are identified.  Right epididymis:  Normal in size and appearance.  Left epididymis:  Normal in size and appearance.  Hydrocele: Small bilateral hydroceles are noted, left greater than right.  Varicocele:  None visualized.  Pulsed Doppler interrogation of both testes demonstrates low resistance arterial and venous waveforms bilaterally.  IMPRESSION: No evidence of testicular torsion.  2.4 mm ill-defined slightly hypoechoic area within the mid-lower left testicle. This likely is of little clinical  significance but 6 month ultrasound followup is recommended.  Small bilateral hydroceles, left greater than right.   Electronically Signed   By: Hassan Rowan M.D.   On: 02/12/2014 22:00   [place] Art/ven Flow Abd Pelv Doppler  02/12/2014   CLINICAL DATA:  28 year old male with bilateral scrotal pain.  EXAM: SCROTAL ULTRASOUND  DOPPLER ULTRASOUND OF THE TESTICLES  TECHNIQUE: Complete ultrasound examination of the testicles, epididymis, and other scrotal structures was performed. Color and spectral Doppler ultrasound were also utilized to evaluate blood flow to the testicles.  COMPARISON:  03/01/2010 ultrasound  FINDINGS: Right testicle  Measurements: 4.9 x 2.3 x 3.5 cm. No mass or microlithiasis visualized.  Left testicle  Measurements: 4.9 x 2.9 x 3.7 cm. A 2.4 mm ill-defined slightly hyperechoic area within the mid- lower left testicle is indeterminate but most likely of little clinical significance. No other testicular abnormalities are identified.  Right epididymis:  Normal in size and appearance.  Left epididymis:  Normal in size and appearance.  Hydrocele: Small bilateral hydroceles are noted, left greater than right.  Varicocele:  None visualized.  Pulsed Doppler interrogation of both testes demonstrates low resistance arterial and venous waveforms  bilaterally.  IMPRESSION: No evidence of testicular torsion.  2.4 mm ill-defined slightly hypoechoic area within the mid-lower left testicle. This likely is of little clinical significance but 6 month ultrasound followup is recommended.  Small bilateral hydroceles, left greater than right.   Electronically Signed   By: Hassan Rowan M.D.   On: 02/12/2014 22:00     EKG Interpretation None      MDM   Final diagnoses:  Lumbar strain  Testicular/scrotal pain  I personally performed the services described in this documentation, which was scribed in my presence. The recorded information has been reviewed and considered.      Shaune Pollack, MD 02/21/14 [place], MD 04/05/14 [phone]

## 2014-02-12 NOTE — Discharge Instructions (Signed)
Back Pain, Adult  Back pain is very common. The pain often gets better over time. The cause of back pain is usually not dangerous. Most people can learn to manage their back pain on their own.   HOME CARE   · Stay active. Start with short walks on flat ground if you can. Try to walk farther each day.  · Do not sit, drive, or stand in one place for more than 30 minutes. Do not stay in bed.  · Do not avoid exercise or work. Activity can help your back heal faster.  · Be careful when you bend or lift an object. Bend at your knees, keep the object close to you, and do not twist.  · Sleep on a firm mattress. Lie on your side, and bend your knees. If you lie on your back, put a pillow under your knees.  · Only take medicines as told by your doctor.  · Put ice on the injured area.  · Put ice in a plastic bag.  · Place a towel between your skin and the bag.  · Leave the ice on for 15-20 minutes, 03-04 times a day for the first 2 to 3 days. After that, you can switch between ice and heat packs.  · Ask your doctor about back exercises or massage.  · Avoid feeling anxious or stressed. Find good ways to deal with stress, such as exercise.  GET HELP RIGHT AWAY IF:   · Your pain does not go away with rest or medicine.  · Your pain does not go away in 1 week.  · You have new problems.  · You do not feel well.  · The pain spreads into your legs.  · You cannot control when you poop (bowel movement) or pee (urinate).  · Your arms or legs feel weak or lose feeling (numbness).  · You feel sick to your stomach (nauseous) or throw up (vomit).  · You have belly (abdominal) pain.  · You feel like you may pass out (faint).  MAKE SURE YOU:   · Understand these instructions.  · Will watch your condition.  · Will get help right away if you are not doing well or get worse.  Document Released: 04/29/2008 Document Revised: 02/03/2012 Document Reviewed: 04/01/2011  Exit[employer]® Patient Information ©2014 [employer].

## 2014-03-01 ENCOUNTER — Emergency Department (HOSPITAL_BASED_OUTPATIENT_CLINIC_OR_DEPARTMENT_OTHER)
Admission: EM | Admit: 2014-03-01 | Discharge: 2014-03-01 | Disposition: A | Discharge status: Home / Self Care | Payer: Medicaid Other | Attending: Emergency Medicine | Admitting: Emergency Medicine

## 2014-03-01 ENCOUNTER — Encounter (HOSPITAL_BASED_OUTPATIENT_CLINIC_OR_DEPARTMENT_OTHER): Payer: Self-pay | Admitting: Emergency Medicine

## 2014-03-01 ENCOUNTER — Telehealth: Payer: Self-pay | Admitting: Family Medicine

## 2014-03-01 ENCOUNTER — Emergency Department (HOSPITAL_BASED_OUTPATIENT_CLINIC_OR_DEPARTMENT_OTHER): Payer: Medicaid Other

## 2014-03-01 DIAGNOSIS — Z79899 Other long term (current) drug therapy: Secondary | ICD-10-CM | POA: Insufficient documentation

## 2014-03-01 DIAGNOSIS — M79609 Pain in unspecified limb: Secondary | ICD-10-CM | POA: Insufficient documentation

## 2014-03-01 DIAGNOSIS — E669 Obesity, unspecified: Secondary | ICD-10-CM | POA: Insufficient documentation

## 2014-03-01 DIAGNOSIS — F319 Bipolar disorder, unspecified: Secondary | ICD-10-CM | POA: Insufficient documentation

## 2014-03-01 DIAGNOSIS — Z8711 Personal history of peptic ulcer disease: Secondary | ICD-10-CM | POA: Insufficient documentation

## 2014-03-01 DIAGNOSIS — Z791 Long term (current) use of non-steroidal anti-inflammatories (NSAID): Secondary | ICD-10-CM | POA: Insufficient documentation

## 2014-03-01 DIAGNOSIS — K219 Gastro-esophageal reflux disease without esophagitis: Secondary | ICD-10-CM | POA: Insufficient documentation

## 2014-03-01 DIAGNOSIS — Z9889 Other specified postprocedural states: Secondary | ICD-10-CM | POA: Insufficient documentation

## 2014-03-01 DIAGNOSIS — Z8614 Personal history of Methicillin resistant Staphylococcus aureus infection: Secondary | ICD-10-CM | POA: Insufficient documentation

## 2014-03-01 DIAGNOSIS — M79672 Pain in left foot: Secondary | ICD-10-CM

## 2014-03-01 MED ORDER — OXYCODONE HCL 5 MG PO TABS: 5.0000 mg | ORAL_TABLET | ORAL | Status: DC | PRN

## 2014-03-01 MED ORDER — OXYCODONE HCL 5 MG PO TABS
10.0000 mg | ORAL_TABLET | Freq: Once | ORAL | Status: AC
  Administered 2014-03-01: 10 mg via ORAL
  Filled 2014-03-01: qty 2

## 2014-03-01 MED ORDER — OXYCODONE-ACETAMINOPHEN 5-325 MG PO TABS: 1.0000 | ORAL_TABLET | Freq: Four times a day (QID) | ORAL | Status: DC | PRN

## 2014-03-01 NOTE — Discharge Instructions (Signed)
Cryotherapy Cryotherapy means treatment with cold. Ice or gel packs can be used to reduce both pain and swelling. Ice is the most helpful within the first 24 to 48 hours after an injury or flareup from overusing a muscle or joint. Sprains, strains, spasms, burning pain, shooting pain, and aches can all be eased with ice. Ice can also be used when recovering from surgery. Ice is effective, has very few side effects, and is safe for most people to use. PRECAUTIONS  Ice is not a safe treatment option for people with:  Raynaud's phenomenon. This is a condition affecting small blood vessels in the extremities. Exposure to cold may cause your problems to return.  Cold hypersensitivity. There are many forms of cold hypersensitivity, including:  Cold urticaria. Red, itchy hives appear on the skin when the tissues begin to warm after being iced.  Cold erythema. This is a red, itchy rash caused by exposure to cold.  Cold hemoglobinuria. Red blood cells break down when the tissues begin to warm after being iced. The hemoglobin that carry oxygen are passed into the urine because they cannot combine with blood proteins fast enough.  Numbness or altered sensitivity in the area being iced. If you have any of the following conditions, do not use ice until you have discussed cryotherapy with your caregiver:  Heart conditions, such as arrhythmia, angina, or chronic heart disease.  High blood pressure.  Healing wounds or open skin in the area being iced.  Current infections.  Rheumatoid arthritis.  Poor circulation.  Diabetes. Ice slows the blood flow in the region it is applied. This is beneficial when trying to stop inflamed tissues from spreading irritating chemicals to surrounding tissues. However, if you expose your skin to cold temperatures for too long or without the proper protection, you can damage your skin or nerves. Watch for signs of skin damage due to cold. HOME CARE INSTRUCTIONS Follow  these tips to use ice and cold packs safely.  Place a dry or damp towel between the ice and skin. A damp towel will cool the skin more quickly, so you may need to shorten the time that the ice is used.  For a more rapid response, add gentle compression to the ice.  Ice for no more than 10 to 20 minutes at a time. The bonier the area you are icing, the less time it will take to get the benefits of ice.  Check your skin after 5 minutes to make sure there are no signs of a poor response to cold or skin damage.  Rest 20 minutes or more in between uses.  Once your skin is numb, you can end your treatment. You can test numbness by very lightly touching your skin. The touch should be so light that you do not see the skin dimple from the pressure of your fingertip. When using ice, most people will feel these normal sensations in this order: cold, burning, aching, and numbness.  Do not use ice on someone who cannot communicate their responses to pain, such as small children or people with dementia. HOW TO MAKE AN ICE PACK Ice packs are the most common way to use ice therapy. Other methods include ice massage, ice baths, and cryo-sprays. Muscle creams that cause a cold, tingly feeling do not offer the same benefits that ice offers and should not be used as a substitute unless recommended by your caregiver. To make an ice pack, do one of the following:  Place crushed ice or   a bag of frozen vegetables in a sealable plastic bag. Squeeze out the excess air. Place this bag inside another plastic bag. Slide the bag into a pillowcase or place a damp towel between your skin and the bag.  Mix 3 parts water with 1 part rubbing alcohol. Freeze the mixture in a sealable plastic bag. When you remove the mixture from the freezer, it will be slushy. Squeeze out the excess air. Place this bag inside another plastic bag. Slide the bag into a pillowcase or place a damp towel between your skin and the bag. SEEK MEDICAL  CARE IF:  You develop white spots on your skin. This may give the skin a blotchy (mottled) appearance.  Your skin turns blue or pale.  Your skin becomes waxy or hard.  Your swelling gets worse. MAKE SURE YOU:   Understand these instructions.  Will watch your condition.  Will get help right away if you are not doing well or get worse. Document Released: 07/08/2011 Document Revised: 02/03/2012 Document Reviewed: 07/08/2011 Exit[employer] Patient Information 2014 [employer].  

## 2014-03-01 NOTE — Telephone Encounter (Signed)
Patient decided to go to urgent care

## 2014-03-01 NOTE — ED Notes (Signed)
Pt c/o left foot pain x 3 days HX gout

## 2014-03-01 NOTE — ED Provider Notes (Signed)
CSN: [id number]     Arrival date & time 03/01/14  1713 History   First MD Initiated Contact with Patient 03/01/14 1724     Chief Complaint  Patient presents with  . Foot Pain     (Consider location/radiation/quality/duration/timing/severity/associated sxs/prior Treatment) Patient is a 28 y.o. male presenting with lower extremity pain. The history is provided by the patient. No language interpreter was used.  Foot Pain This is a new problem. The current episode started in the past 7 days. The problem has been unchanged. Pertinent negatives include no chills or fever. Associated symptoms comments: Patient complains of left foot pain for the past 3 days without known injury. 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 He also reports a history of gout. Pain is along lateral foot, greatest at the proximal foot. No swelling..    Past Medical History  Diagnosis Date  . Tobacco dipper   . GERD (gastroesophageal reflux disease)   . ADHD (attention deficit hyperactivity disorder)   . Hx MRSA infection 8/08    right thigh  . S/P colonoscopy 11/10, 10/08    Dr [name]  . S/P endoscopy 10/08, 2/10, 5/10    Dr Raliegh Scarlet reflux esophagitis, small HH  . Occult GI bleeding 12/2008    Trivial upper GI bleed/uncontrolled GERD by upper endoscopy 12/2008, normal f/u endoscopy 03/2009 with SBCE at that time  . Bipolar 1 disorder   . Esophagitis     Distal esophageal erosions consistent with mild erosive  reflux esophagitis 12/2008 by EGD   . Thyroid function test abnormal     Noted in 2011 discharge  . Obesity   . Gastric ulcer   . Hypercholesteremia    Past Surgical History  Procedure Laterality Date  . Vasectomy    . Ankle surgery    . Small bowel capsule  04/11/2009     normal throughout  . Esophagogastroduodenoscopy   04/07/2009    Normal esophagus, small hiatal hernia  . Esophagogastroduodenoscopy  01/09/2009    Distal esophageal  erosions consistent with mild erosive reflux esophagitis, otherwise normal esophagus, small hiatal herniaotherwise normal stomach, D1-D2   . Ileocolonoscopy  08/26/2007     A normal rectum, colon, and terminal ileum  . Esophagogastroduodenoscopy  08/26/2007    Normal esophagus, a small hiatal/hernia, otherwise normal stomach D1 through D3  . Colonoscopy  10/10/2009    anal papilla otherwise normal   Family History  Problem Relation Age of Onset  . Leukemia Father 38  . Seizures Mother   . Bipolar disorder Brother   . ADD / ADHD Brother    History  Substance Use Topics  . Smoking status: Never Smoker   . Smokeless tobacco: Current User    Types: Chew     Comment: DIPx 10 years  . Alcohol Use: Yes    Review of Systems  Constitutional: Negative for fever and chills.  Musculoskeletal: Negative.        See HPI  Skin: Negative.   Neurological: Negative.       Allergies  Tylenol  Home Medications   Current Outpatient Rx  Name  Route  Sig  Dispense  Refill  . diclofenac sodium (VOLTAREN) 1 % GEL   Topical   Apply 2 g topically 4 (four) times daily.   100 g   11   . FLUoxetine (PROZAC) 40 MG capsule   Oral   Take 1 capsule (40 mg total) by  mouth daily.   30 capsule   11   . nystatin (MYCOSTATIN/NYSTOP) 100000 UNIT/GM POWD   Topical   Apply 1 Bottle topically 2 (two) times daily.   30 g   5   . OLANZapine (ZYPREXA) 10 MG tablet   Oral   Take 1 tablet (10 mg total) by mouth at bedtime.   30 tablet   11   . pantoprazole (PROTONIX) 40 MG tablet   Oral   Take 1 tablet (40 mg total) by mouth 2 (two) times daily before a meal.   62 tablet   11    BP 132/81  Pulse 91  Temp(Src) 97.6 F (36.4 C)  Resp 18  Ht 6' (1.829 m)  Wt 390 lb (176.903 kg)  BMI 52.88 kg/m2  SpO2 100% Physical Exam  Constitutional: He is oriented to person, place, and time. He appears well-developed and well-nourished.  Neck: Normal range of motion.  Pulmonary/Chest: Effort  normal.  Musculoskeletal: Normal range of motion.  Left foot unremarkable in appearance. No swelling, discoloration or deformity. Tender along 5th MT greatest at base. Ankle nontender and stable.   Neurological: He is alert and oriented to person, place, and time.  Skin: Skin is warm and dry.  Psychiatric: He has a normal mood and affect.    ED Course  Procedures (including critical care time) Labs Review Labs Reviewed - No data to display Imaging Review Dg Foot Complete Left  03/01/2014   CLINICAL DATA:  Pain lateral left foot for 1 week.  No injury.  EXAM: LEFT FOOT - COMPLETE 3+ VIEW  COMPARISON:  None.  FINDINGS: There are degenerative changes of the ankle. There is no evidence of acute fracture or dislocation. Remainder the exam is unremarkable.  IMPRESSION: No acute findings.   Electronically Signed   By: Marin Olp M.D.   On: 03/01/2014 18:32     EKG Interpretation None      MDM   Final diagnoses:  None    1. Left foot pain  No Jones fracture apparent on x-ray. He can be discharged home and should follow up with primary care if pain continues.     Dewaine Oats, PA-C 03/01/14 1855

## 2014-03-01 NOTE — ED Provider Notes (Signed)
Medical screening examination/treatment/procedure(s) were performed by non-physician practitioner and as supervising physician I was immediately available for consultation/collaboration.   EKG Interpretation None        Mervin Kung, MD 03/01/14 [phone]

## 2014-03-29 ENCOUNTER — Ambulatory Visit (INDEPENDENT_AMBULATORY_CARE_PROVIDER_SITE_OTHER): Payer: Medicaid Other | Admitting: Family Medicine

## 2014-03-29 VITALS — BP 152/102 | HR 102 | Temp 98.2°F | Ht 72.0 in | Wt >= 6400 oz

## 2014-03-29 DIAGNOSIS — K219 Gastro-esophageal reflux disease without esophagitis: Secondary | ICD-10-CM

## 2014-03-29 DIAGNOSIS — M25579 Pain in unspecified ankle and joints of unspecified foot: Secondary | ICD-10-CM

## 2014-03-29 DIAGNOSIS — M25572 Pain in left ankle and joints of left foot: Secondary | ICD-10-CM

## 2014-03-29 DIAGNOSIS — F319 Bipolar disorder, unspecified: Secondary | ICD-10-CM

## 2014-03-29 MED ORDER — FLUOXETINE HCL 40 MG PO CAPS: 40.0000 mg | ORAL_CAPSULE | Freq: Every day | ORAL | Status: DC

## 2014-03-29 MED ORDER — PANTOPRAZOLE SODIUM 40 MG PO TBEC: 40.0000 mg | DELAYED_RELEASE_TABLET | Freq: Two times a day (BID) | ORAL | Status: DC

## 2014-03-29 MED ORDER — OLANZAPINE 10 MG PO TABS: 10.0000 mg | ORAL_TABLET | Freq: Every day | ORAL | Status: DC

## 2014-03-29 NOTE — Progress Notes (Signed)
   Subjective:    Patient ID: [name], male    DOB: 1986/06/28, 28 y.o.   MRN: [id number]  HPI This 28 y.o. male presents for evaluation of ankle pain and needing refills on meds.  He  Wants pain medicine for his ankle pain which is chronic.  He has hx of ankle surgery and  Has not seen orthopedics in awhile.  He has bipolar disorder and is taking zyprexa and prozac and states it is not working anymore.  He is having problems with bipolar disorder.   Review of Systems C/o ankle pain and worsening bipolar symptoms No chest pain, SOB, HA, dizziness, vision change, N/V, diarrhea, constipation, dysuria, urinary urgency or frequency, myalgias, arthralgias or rash.     Objective:   Physical Exam  Vital signs noted  Well developed well nourished male.  HEENT - Head atraumatic Normocephalic                Eyes - PERRLA, Conjuctiva - clear Sclera- Clear EOMI                Ears - EAC's Wnl TM's Wnl Gross Hearing WNL                Throat - oropharanx wnl Respiratory - Lungs CTA bilateral Cardiac - RRR S1 and S2 without murmur GI - Abdomen soft Nontender and bowel sounds active x 4 Extremities - No edema. Neuro - Grossly intact.      Assessment & Plan:  Left ankle pain - Plan: Ambulatory referral to Orthopedic Surgery, Ambulatory referral to Orthopedic Surgery.    Bipolar disorder, unspecified - Plan: OLANZapine (ZYPREXA) 10 MG tablet, FLUoxetine (PROZAC) 40 MG capsule.  I gave patient handout of psychiatric providers to call and self refer.  Explained will not change from medicine regimen at present.  GERD (gastroesophageal reflux disease) - Plan: pantoprazole (PROTONIX) 40 MG tablet  Lysbeth Penner FNP

## 2014-03-31 ENCOUNTER — Other Ambulatory Visit: Payer: Self-pay | Admitting: *Deleted

## 2014-03-31 DIAGNOSIS — K219 Gastro-esophageal reflux disease without esophagitis: Secondary | ICD-10-CM

## 2014-03-31 MED ORDER — PANTOPRAZOLE SODIUM 40 MG PO TBEC
40.0000 mg | DELAYED_RELEASE_TABLET | Freq: Every day | ORAL | Status: DC
Start: 2014-03-31 — End: 2015-06-15

## 2014-03-31 NOTE — Telephone Encounter (Signed)
Insurance wouldn't cover protonix BID. Changed to QD.

## 2014-04-06 ENCOUNTER — Telehealth: Payer: Self-pay | Admitting: *Deleted

## 2014-04-06 NOTE — Telephone Encounter (Signed)
Talked with [name] regarding his protonix 2 daily that I was trying to get prior authorization on and he said he only takes one daily and had already gotten it filled so no PA needed.

## 2014-04-10 ENCOUNTER — Encounter (HOSPITAL_COMMUNITY): Payer: Self-pay | Admitting: Emergency Medicine

## 2014-04-10 ENCOUNTER — Emergency Department (HOSPITAL_COMMUNITY)
Admission: EM | Admit: 2014-04-10 | Discharge: 2014-04-11 | Disposition: A | Discharge status: Home / Self Care | Payer: Medicaid Other | Attending: Emergency Medicine | Admitting: Emergency Medicine

## 2014-04-10 ENCOUNTER — Emergency Department (HOSPITAL_COMMUNITY): Payer: Medicaid Other

## 2014-04-10 DIAGNOSIS — R34 Anuria and oliguria: Secondary | ICD-10-CM

## 2014-04-10 DIAGNOSIS — K219 Gastro-esophageal reflux disease without esophagitis: Secondary | ICD-10-CM | POA: Insufficient documentation

## 2014-04-10 DIAGNOSIS — M25519 Pain in unspecified shoulder: Secondary | ICD-10-CM | POA: Insufficient documentation

## 2014-04-10 DIAGNOSIS — Z79899 Other long term (current) drug therapy: Secondary | ICD-10-CM | POA: Insufficient documentation

## 2014-04-10 DIAGNOSIS — R071 Chest pain on breathing: Secondary | ICD-10-CM | POA: Insufficient documentation

## 2014-04-10 DIAGNOSIS — B372 Candidiasis of skin and nail: Secondary | ICD-10-CM

## 2014-04-10 DIAGNOSIS — Z8659 Personal history of other mental and behavioral disorders: Secondary | ICD-10-CM | POA: Insufficient documentation

## 2014-04-10 DIAGNOSIS — A630 Anogenital (venereal) warts: Secondary | ICD-10-CM | POA: Insufficient documentation

## 2014-04-10 DIAGNOSIS — F17200 Nicotine dependence, unspecified, uncomplicated: Secondary | ICD-10-CM | POA: Insufficient documentation

## 2014-04-10 DIAGNOSIS — R0789 Other chest pain: Secondary | ICD-10-CM

## 2014-04-10 DIAGNOSIS — Z8614 Personal history of Methicillin resistant Staphylococcus aureus infection: Secondary | ICD-10-CM | POA: Insufficient documentation

## 2014-04-10 LAB — I-STAT CHEM 8, ED
BUN: 12 mg/dL (ref 6–23)
Calcium, Ion: 1.19 mmol/L (ref 1.12–1.23)
Chloride: 101 mEq/L (ref 96–112)
Creatinine, Ser: 1.1 mg/dL (ref 0.50–1.35)
Glucose, Bld: 94 mg/dL (ref 70–99)
HCT: 45 % (ref 39.0–52.0)
HEMOGLOBIN: 15.3 g/dL (ref 13.0–17.0)
POTASSIUM: 3.6 meq/L — AB (ref 3.7–5.3)
SODIUM: 140 meq/L (ref 137–147)
TCO2: 27 mmol/L (ref 0–100)

## 2014-04-10 LAB — URINALYSIS, ROUTINE W REFLEX MICROSCOPIC
Bilirubin Urine: NEGATIVE
Glucose, UA: NEGATIVE mg/dL
Hgb urine dipstick: NEGATIVE
Ketones, ur: NEGATIVE mg/dL
LEUKOCYTES UA: NEGATIVE
NITRITE: NEGATIVE
PROTEIN: NEGATIVE mg/dL
SPECIFIC GRAVITY, URINE: 1.034 — AB (ref 1.005–1.030)
UROBILINOGEN UA: 0.2 mg/dL (ref 0.0–1.0)
pH: 5 (ref 5.0–8.0)

## 2014-04-10 MED ORDER — SODIUM CHLORIDE 0.9 % IV BOLUS (SEPSIS)
1000.0000 mL | Freq: Once | INTRAVENOUS | Status: AC
  Administered 2014-04-10: 1000 mL via INTRAVENOUS

## 2014-04-10 MED ORDER — MORPHINE SULFATE 4 MG/ML IJ SOLN
4.0000 mg | Freq: Once | INTRAMUSCULAR | Status: AC
  Administered 2014-04-10: 4 mg via INTRAVENOUS
  Filled 2014-04-10: qty 1

## 2014-04-10 MED ORDER — SODIUM CHLORIDE 0.9 % IV BOLUS (SEPSIS)
1000.0000 mL | Freq: Once | INTRAVENOUS | Status: AC
  Administered 2014-04-11: 1000 mL via INTRAVENOUS

## 2014-04-10 NOTE — ED Notes (Signed)
Bladder scan difficult to obtain due to obesity; small yellow circle showed on screen, but scanner read 44ml.

## 2014-04-10 NOTE — ED Provider Notes (Signed)
CSN: [id number]     Arrival date & time 04/10/14  1933 History   First MD Initiated Contact with Patient 04/10/14 2006     Chief Complaint  Patient presents with  . Shoulder Pain  . Urinary Retention     (Consider location/radiation/quality/duration/timing/severity/associated sxs/prior Treatment) HPI History provided by pt.   Pt presents with multiple complaints.  Has not urinated since yesterday am.  Had the urge to go this afternoon but it has resolved.  No associated fever, abdominal pain or other GU sx, with exception of painful rash of R groin since this morning and pruritic rash of L groin and across waistline since yesterday.  Has never had any of these sx in the past.  Is in a monogamous relationship but his wife had a sexual encounter with another man while they were separated recently.  Also c/o constant L anterior CP.  Aggravated by laying on R side and maybe mildly by exertional.  Radiating to L shoulder today.  Had a 75min episode of dyspnea at rest this afternoon.  Denies fever and cough.  Denies trauma.  Low risk ACS.  Has h/o GERD for which he takes protonix, but this pain feels different.   Past Medical History  Diagnosis Date  . Tobacco dipper   . GERD (gastroesophageal reflux disease)   . ADHD (attention deficit hyperactivity disorder)   . Hx MRSA infection 8/08    right thigh  . S/P colonoscopy 11/10, 10/08    Dr [name]  . S/P endoscopy 10/08, 2/10, 5/10    Dr Raliegh Scarlet reflux esophagitis, small HH  . Occult GI bleeding 12/2008    Trivial upper GI bleed/uncontrolled GERD by upper endoscopy 12/2008, normal f/u endoscopy 03/2009 with SBCE at that time  . Bipolar 1 disorder   . Esophagitis     Distal esophageal erosions consistent with mild erosive  reflux esophagitis 12/2008 by EGD   . Thyroid function test abnormal     Noted in 2011 discharge  . Obesity   . Gastric ulcer   . Hypercholesteremia    Past Surgical History  Procedure Laterality Date  .  Vasectomy    . Ankle surgery    . Small bowel capsule  04/11/2009     normal throughout  . Esophagogastroduodenoscopy   04/07/2009    Normal esophagus, small hiatal hernia  . Esophagogastroduodenoscopy  01/09/2009    Distal esophageal erosions consistent with mild erosive reflux esophagitis, otherwise normal esophagus, small hiatal herniaotherwise normal stomach, D1-D2   . Ileocolonoscopy  08/26/2007     A normal rectum, colon, and terminal ileum  . Esophagogastroduodenoscopy  08/26/2007    Normal esophagus, a small hiatal/hernia, otherwise normal stomach D1 through D3  . Colonoscopy  10/10/2009    anal papilla otherwise normal   Family History  Problem Relation Age of Onset  . Leukemia Father 62  . Seizures Mother   . Bipolar disorder Brother   . ADD / ADHD Brother    History  Substance Use Topics  . Smoking status: Never Smoker   . Smokeless tobacco: Current User    Types: Chew     Comment: DIPx 10 years  . Alcohol Use: Yes    Review of Systems  All other systems reviewed and are negative.     Allergies  Tramadol; Ibuprofen; and Tylenol  Home Medications   Prior to Admission medications   Medication Sig Start Date End Date Taking? Authorizing Provider  diclofenac sodium (VOLTAREN) 1 % GEL Apply 2  g topically 4 (four) times daily as needed (for pain in ankle). 08/19/13  Yes Lysbeth Penner, FNP  oxyCODONE (ROXICODONE) 5 MG immediate release tablet Take 1 tablet (5 mg total) by mouth every 4 (four) hours as needed for severe pain. 03/01/14  Yes Shari A Upstill, PA-C  pantoprazole (PROTONIX) 40 MG tablet Take 1 tablet (40 mg total) by mouth daily. 03/31/14 07/15/15 Yes Lysbeth Penner, FNP   BP 124/64  Pulse 86  Temp(Src) 98.3 F (36.8 C) (Oral)  Resp 16  Ht 6' (1.829 m)  Wt 388 lb (175.996 kg)  BMI 52.61 kg/m2  SpO2 97% Physical Exam  Nursing note and vitals reviewed. Constitutional: He is oriented to person, place, and time. He appears well-developed and  well-nourished.  Morbidly obese  HENT:  Head: Normocephalic and atraumatic.  Eyes:  Normal appearance  Neck: Normal range of motion.  Cardiovascular: Normal rate and regular rhythm.   Pulmonary/Chest: Effort normal and breath sounds normal. No respiratory distress.  Tenderness L anterior chest.  Pain is also aggravated by twisting torso to right and passive ROM of L shoulder.    Abdominal: Soft. Bowel sounds are normal. He exhibits no distension.  Mild suprapubic ttp  Genitourinary:  Several, discrete, Raised, verrucous, pink, papular lesions of various sizes along L groin.  Erythema of R groin and along anterior waistline w/ satellite lesions that spread into L thigh and around to flank bilaterally.  Testicles descended bilaterally.  Uncircumcised.  No urethral discharge.    Musculoskeletal: Normal range of motion.  L shoulder diffusely, mildly ttp.  2+ radial pulse and distal sensation intact.      Neurological: He is alert and oriented to person, place, and time.  Skin: Skin is warm and dry. No rash noted.  Psychiatric: He has a normal mood and affect. His behavior is normal.    ED Course  Procedures (including critical care time) Labs Review Labs Reviewed  URINALYSIS, ROUTINE W REFLEX MICROSCOPIC - Abnormal; Notable for the following:    Color, Urine AMBER (*)    Specific Gravity, Urine 1.034 (*)    All other components within normal limits  CBC WITH DIFFERENTIAL - Abnormal; Notable for the following:    WBC 11.6 (*)    All other components within normal limits  I-STAT CHEM 8, ED - Abnormal; Notable for the following:    Potassium 3.6 (*)    All other components within normal limits  GC/CHLAMYDIA PROBE AMP  HIV ANTIBODY (ROUTINE TESTING)  BASIC METABOLIC PANEL    Imaging Review Dg Chest 2 View  04/10/2014   CLINICAL DATA:  Chest pain  EXAM: CHEST  2 VIEW  COMPARISON:  DG CHEST 2 VIEW dated 10/15/2012  FINDINGS: Low lung volumes. The heart size and mediastinal contours  are within normal limits. Both lungs are clear. The visualized skeletal structures are unremarkable.  IMPRESSION: No active cardiopulmonary disease.   Electronically Signed   By: Margaree Mackintosh M.D.   On: 04/10/2014 22:19     EKG Interpretation   Date/Time:  Sunday Apr 10 2014 19:45:27 EDT Ventricular Rate:  88 PR Interval:  144 QRS Duration: 98 QT Interval:  355 QTC Calculation: 429 R Axis:   16 Text Interpretation:  Sinus rhythm No significant change was found  Confirmed by Wyvonnia Dusky  MD, STEPHEN [phone]) on 04/10/2014 9:27:12 PM      MDM   Final diagnoses:  Anuria  Chest wall pain    28yo M presents w/ multiple complaints,  including urinary retention and non-traumatic L anterior CP.  Has not urinated since yesterday am.  Only 150cc in bladder when foley inserted.  Abd benign.  BUN/Cr nml range.   U/A neg for infection but SG elevated.  Pt received 3L NS and was able to urinate another ~150cc.  He requests discharge. I advised him to continue drinking fluids and f/u with his PCP.  Doubt ACS; low risk, CP atypical and reproducible on exam, EKG non-ischemic.  CXR unremarkable.  Pt reassured.  Will treat symptomatically w/ NSAID, heat/ice and rest.  Return precautions discussed.     Remer Macho, PA-C 04/11/14 2000

## 2014-04-10 NOTE — ED Notes (Signed)
Attempting to urinate again; wants to try again before catheter attempt.

## 2014-04-10 NOTE — ED Notes (Signed)
Working out in sun for two days; reports left chest and shoulder pain around to left upper back pain. No injury that he knows of. Also concerned because hardly any urination for two days; reports urinating small amount yesterday morning and then no more and today an even smaller amount this morning and none since. Reports drinking about 2 gallons of water and 1 gatorade today trying to get urination to come. Denies abdominal pain or discomfort.

## 2014-04-10 NOTE — ED Notes (Addendum)
Patient returned from X-ray 

## 2014-04-11 LAB — GC/CHLAMYDIA PROBE AMP
CT PROBE, AMP APTIMA: NEGATIVE
GC PROBE AMP APTIMA: NEGATIVE

## 2014-04-11 LAB — CBC WITH DIFFERENTIAL/PLATELET
Basophils Absolute: 0.1 10*3/uL (ref 0.0–0.1)
Basophils Relative: 1 % (ref 0–1)
Eosinophils Absolute: 0.2 10*3/uL (ref 0.0–0.7)
Eosinophils Relative: 1 % (ref 0–5)
HEMATOCRIT: 42.2 % (ref 39.0–52.0)
HEMOGLOBIN: 14.1 g/dL (ref 13.0–17.0)
LYMPHS PCT: 35 % (ref 12–46)
Lymphs Abs: 4 10*3/uL (ref 0.7–4.0)
MCH: 28.3 pg (ref 26.0–34.0)
MCHC: 33.4 g/dL (ref 30.0–36.0)
MCV: 84.7 fL (ref 78.0–100.0)
Monocytes Absolute: 0.8 10*3/uL (ref 0.1–1.0)
Monocytes Relative: 7 % (ref 3–12)
NEUTROS ABS: 6.6 10*3/uL (ref 1.7–7.7)
NEUTROS PCT: 56 % (ref 43–77)
Platelets: 291 10*3/uL (ref 150–400)
RBC: 4.98 MIL/uL (ref 4.22–5.81)
RDW: 15 % (ref 11.5–15.5)
WBC: 11.6 10*3/uL — AB (ref 4.0–10.5)

## 2014-04-11 LAB — BASIC METABOLIC PANEL
BUN: 13 mg/dL (ref 6–23)
CO2: 26 mEq/L (ref 19–32)
CREATININE: 1.02 mg/dL (ref 0.50–1.35)
Calcium: 9.3 mg/dL (ref 8.4–10.5)
Chloride: 99 mEq/L (ref 96–112)
GFR calc Af Amer: >90 mL/min (ref >90)
Glucose, Bld: 91 mg/dL (ref 70–99)
POTASSIUM: 3.9 meq/L (ref 3.7–5.3)
Sodium: 138 mEq/L (ref 137–147)

## 2014-04-11 LAB — HIV ANTIBODY (ROUTINE TESTING W REFLEX): HIV: NONREACTIVE

## 2014-04-11 MED ORDER — SODIUM CHLORIDE 0.9 % IV BOLUS (SEPSIS)
1000.0000 mL | Freq: Once | INTRAVENOUS | Status: AC
  Administered 2014-04-11: 1000 mL via INTRAVENOUS

## 2014-04-11 MED ORDER — CLOTRIMAZOLE 1 % EX CREA: TOPICAL_CREAM | CUTANEOUS | Status: DC

## 2014-04-11 NOTE — ED Notes (Signed)
Patient walked to bathroom and gave 129ml of urine.

## 2014-04-11 NOTE — Discharge Instructions (Signed)
Take ibuprofen with food, up to 800mg  three times a day, as needed for pain in your chest.  Apply a heating pad or ice pack as well.  Avoid activities that aggravate pain.   Continue to drink plenty of fluids.  Apply lotrimin cream to your waist line and groin.  Keep this area dry and apply powder twice a day.  Follow up with the dermatologist for genial warts.   Genital Warts Genital warts are a sexually transmitted infection. They may appear as small bumps on the tissues of the genital area. CAUSES  Genital warts are caused by a virus called human papillomavirus (HPV). HPV is the most common sexually transmitted disease (STD) and infection of the sex organs. This infection is spread by having unprotected sex with an infected person. It can be spread by vaginal, anal, and oral sex. Many people do not know they are infected. They may be infected for years without problems. However, even if they do not have problems, they can unknowingly pass the infection to their sexual partners. SYMPTOMS   Itching and irritation in the genital area.  Warts that bleed.  Painful sexual intercourse. DIAGNOSIS  Warts are usually recognized with the naked eye on the vagina, vulva, perineum, anus, and rectum. Certain tests can also diagnose genital warts, such as:  A Pap test.  A tissue sample (biopsy) exam.  Colposcopy. A magnifying tool is used to examine the vagina and cervix. The HPV cells will change color when certain solutions are used. TREATMENT  Warts can be removed by:  Applying certain chemicals, such as cantharidin or podophyllin.  Liquid nitrogen freezing (cryotherapy).  Immunotherapy with candida or trichophyton injections.  Laser treatment.  Burning with an electrified probe (electrocautery).  Interferon injections.  Surgery. PREVENTION  HPV vaccination can help prevent HPV infections that cause genital warts and that cause cancer of the cervix. It is recommended that the vaccination  be given to people between the ages 64 to 45 years old. The vaccine might not work as well or might not work at all if you already have HPV. It should not be given to pregnant women. HOME CARE INSTRUCTIONS   It is important to follow your caregiver's instructions. The warts will not go away without treatment. Repeat treatments are often needed to get rid of warts. Even after it appears that the warts are gone, the normal tissue underneath often remains infected.  Do not try to treat genital warts with medicine used to treat hand warts. This type of medicine is strong and can burn the skin in the genital area, causing more damage.  Tell your past and current sexual partner(s) that you have genital warts. They may be infected also and need treatment.  Avoid sexual contact while being treated.  Do not touch or scratch the warts. The infection may spread to other parts of your body.  Women with genital warts should have a cervical cancer check (Pap test) at least once a year. This type of cancer is slow-growing and can be cured if found early. Chances of developing cervical cancer are increased with HPV.  Inform your obstetrician about your warts in the event of pregnancy. This virus can be passed to the baby's respiratory tract. Discuss this with your caregiver.  Use a condom during sexual intercourse. Following treatment, the use of condoms will help prevent reinfection.  Ask your caregiver about using over-the-counter anti-itch creams. SEEK MEDICAL CARE IF:   Your treated skin becomes red, swollen, or painful.  You have a fever.  You feel generally ill.  You feel little lumps in and around your genital area.  You are bleeding or have painful sexual intercourse. MAKE SURE YOU:   Understand these instructions.  Will watch your condition.  Will get help right away if you are not doing well or get worse. Document Released: 11/08/2000 Document Revised: 02/03/2012 Document Reviewed:  05/20/2011 [facility] Patient Information 2014 [place], [place]. Follow up with your doctor or Call [facility] [phone]) for assistance with finding a new one.   Return to the ER if your chest pain worsens, you develop difficulty breathing, or do not urinate tomorrow.

## 2014-04-13 NOTE — ED Provider Notes (Signed)
Medical screening examination/treatment/procedure(s) were performed by non-physician practitioner and as supervising physician I was immediately available for consultation/collaboration.   EKG Interpretation   Date/Time:  Sunday Apr 10 2014 19:45:27 EDT Ventricular Rate:  88 PR Interval:  144 QRS Duration: 98 QT Interval:  355 QTC Calculation: 429 R Axis:   16 Text Interpretation:  Sinus rhythm No significant change was found  Confirmed by Wyvonnia Dusky  MD, STEPHEN [phone]) on 04/10/2014 9:27:12 PM        Jenny Reichmann Elba Barman III, MD 04/13/14 [phone]

## 2014-05-12 ENCOUNTER — Ambulatory Visit (INDEPENDENT_AMBULATORY_CARE_PROVIDER_SITE_OTHER): Payer: Medicaid Other | Admitting: Internal Medicine

## 2014-05-12 ENCOUNTER — Encounter: Payer: Self-pay | Admitting: Internal Medicine

## 2014-05-12 VITALS — BP 126/76 | HR 64 | Ht 69.0 in | Wt >= 6400 oz

## 2014-05-12 DIAGNOSIS — F17200 Nicotine dependence, unspecified, uncomplicated: Secondary | ICD-10-CM

## 2014-05-12 DIAGNOSIS — J309 Allergic rhinitis, unspecified: Secondary | ICD-10-CM

## 2014-05-12 DIAGNOSIS — J3089 Other allergic rhinitis: Secondary | ICD-10-CM

## 2014-05-12 DIAGNOSIS — G4733 Obstructive sleep apnea (adult) (pediatric): Secondary | ICD-10-CM

## 2014-05-12 DIAGNOSIS — J302 Other seasonal allergic rhinitis: Secondary | ICD-10-CM

## 2014-05-12 MED ORDER — AZELASTINE-FLUTICASONE 137-50 MCG/ACT NA SUSP: 2.0000 | Freq: Every day | NASAL | Status: DC

## 2014-05-12 NOTE — Patient Instructions (Signed)
Use your CPAP all night, every night and whenever you nap. It keeps the oxygen up in your heart and brain, and that's important!  Sample Dymista nasal spray  1-2 puffs each nostril one time each day  You can try an otc antihistamine like Claritin/ loratadine. This is non-drowsy.

## 2014-05-12 NOTE — Progress Notes (Signed)
07/14/12- 17 yoM nonsmoker referred courtesy of Dr Rayann Heman for evaluation of sleep complaints.  He has a hx of loud snoring and daytime somnolence, with no ENT surgery. A recent ER visit for chest pain ruled out cardiac. While there he was witnessed having apneic episodes. Wife notes loud snoring. He admits history of easy daytime sleepiness if sits quietly. Was treated with Ritalin in the past but not in recent years. Denies hypnagogic hallucinations, sleep paralysis cataplexy by my description.   bedtime between 10 and 11 PM, sleep latency one hour, wakes 3 times, and is rolling over, before up at 8 AM. History of bipolar disorder, ADHD but no cardiopulmonary disease and no ENT surgery. 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  08/20/12- 79 yoM nonsmoker followed for obstructive sleep apnea complicated by history of bipolar disorder, GERD, obesity He recognizes no changes since last visit. NPSG 08/03/12-  AHI 24.1/ hr, moderate OSA. Discussed treatment options   10/01/12- 26 yoM nonsmoker followed for obstructive sleep apnea complicated by history of bipolar disorder, GERD, obesity Review CPAP usage;pt wears CPAP every night for about 7 hours; still moves alot during sleep Had flu vax. Using CPAP 17/ Advanced all night, every night. Changed from auto to fixed pressure 17 with god control documented. Full face mask and humidifier.  6/18/ [age] yoM nonsmoker followed for obstructive sleep apnea complicated by history of bipolar disorder, GERD, obesity FOLLOWS FOR: wears CPAP/ 17/ Advanced every night for at least 6 hours; Can tell if he hasnt slept with it-worn out and snores.   05/12/14- 65 yoM nonsmoker followed for obstructive sleep apnea complicated by history of bipolar disorder, GERD, obesity CPAP/ 17/ Advanced   FOLLOWS FOR: when wearing CPAP gets about 8 hours-in the past 6 months has worn about 8 times-works so much and lays down without it. DME is [facility].  Says he is exhausted when he gets home from work and lies down without his CPAP-discussed. Eyes itch, stuffy nose "allergy"  ROS-see HPI Constitutional:   No-   weight loss, night sweats, fevers, chills, fatigue, lassitude. HEENT:   No-  headaches, difficulty swallowing, tooth/dental problems, sore throat,       No-  sneezing, +itching, ear ache, +nasal congestion, post nasal drip,  CV:  No-  cardiac chest pain, orthopnea, PND, swelling in lower extremities, anasarca,  dizziness, palpitations Resp: + shortness of breath with exertion or at rest.              No-   productive cough,  No non-productive cough,  No- coughing up of blood.              No-   change in color of mucus.  No- wheezing.   Skin: No-   rash or lesions. GI:  + heartburn, indigestion, no-abdominal pain, nausea, vomiting,  GU:  MS:  No-   joint pain or swelling.  Neuro-     nothing unusual Psych:   See HPI-No- change in mood or affect. + depression or anxiety.  No memory loss.  OBJ- Physical Exam General- Alert, Oriented, Affect-appropriate, Distress- none acute, obese/ big Skin- rash-none, lesions- none, excoriation- none Lymphadenopathy- none Head- atraumatic            Eyes- Gross vision intact, PERRLA, conjunctivae and secretions clear            Ears- Hearing, canals-normal            Nose- Clear, no-Septal dev, mucus,  polyps, erosion, perforation             Throat- Mallampati III , mucosa clear , drainage- none, tonsils- 2+ Neck- flexible , trachea midline, no stridor , thyroid nl, carotid no bruit Chest - symmetrical excursion , unlabored           Heart/CV- RRR , no murmur , no gallop  , no rub, nl s1 s2                           - JVD- none , edema- none, stasis changes- none, varices- none           Lung- clear to P&A, wheeze- none, cough- none , dullness-none, rub- none           Chest wall-  Abd-  Br/ Gen/ Rectal- Not done, not indicated Extrem- cyanosis- none, clubbing, none, atrophy- none, strength-  nl Neuro- grossly intact to observation

## 2014-05-13 ENCOUNTER — Encounter: Payer: Self-pay | Admitting: *Deleted

## 2014-05-13 ENCOUNTER — Telehealth: Payer: Self-pay | Admitting: Internal Medicine

## 2014-05-13 NOTE — Telephone Encounter (Signed)
Pt called and will be in [place] for another 30 minutes. Will come by before he goes home to check for note.

## 2014-05-13 NOTE — Telephone Encounter (Signed)
Dr Young please advise thanks 

## 2014-05-13 NOTE — Telephone Encounter (Signed)
Ok work note-seen 05/12/14 for necessary care, LOA 6/18, 6/19 then return to regular duties

## 2014-05-13 NOTE — Telephone Encounter (Signed)
Pt aware letter has been left for pick up. Nothing further needed

## 2014-06-01 ENCOUNTER — Telehealth (HOSPITAL_COMMUNITY): Payer: Self-pay | Admitting: *Deleted

## 2014-06-26 ENCOUNTER — Emergency Department (HOSPITAL_BASED_OUTPATIENT_CLINIC_OR_DEPARTMENT_OTHER)
Admission: EM | Admit: 2014-06-26 | Discharge: 2014-06-26 | Disposition: A | Discharge status: Home / Self Care | Payer: Medicaid Other | Attending: Emergency Medicine | Admitting: Emergency Medicine

## 2014-06-26 ENCOUNTER — Encounter (HOSPITAL_BASED_OUTPATIENT_CLINIC_OR_DEPARTMENT_OTHER): Payer: Self-pay | Admitting: Emergency Medicine

## 2014-06-26 ENCOUNTER — Emergency Department (HOSPITAL_BASED_OUTPATIENT_CLINIC_OR_DEPARTMENT_OTHER): Payer: Medicaid Other

## 2014-06-26 DIAGNOSIS — S20229A Contusion of unspecified back wall of thorax, initial encounter: Secondary | ICD-10-CM | POA: Insufficient documentation

## 2014-06-26 DIAGNOSIS — Y9389 Activity, other specified: Secondary | ICD-10-CM | POA: Insufficient documentation

## 2014-06-26 DIAGNOSIS — S99929A Unspecified injury of unspecified foot, initial encounter: Secondary | ICD-10-CM | POA: Diagnosis present

## 2014-06-26 DIAGNOSIS — E669 Obesity, unspecified: Secondary | ICD-10-CM | POA: Insufficient documentation

## 2014-06-26 DIAGNOSIS — W1809XA Striking against other object with subsequent fall, initial encounter: Secondary | ICD-10-CM | POA: Insufficient documentation

## 2014-06-26 DIAGNOSIS — S93409A Sprain of unspecified ligament of unspecified ankle, initial encounter: Secondary | ICD-10-CM | POA: Insufficient documentation

## 2014-06-26 DIAGNOSIS — S93402A Sprain of unspecified ligament of left ankle, initial encounter: Secondary | ICD-10-CM

## 2014-06-26 DIAGNOSIS — S99919A Unspecified injury of unspecified ankle, initial encounter: Secondary | ICD-10-CM

## 2014-06-26 DIAGNOSIS — Y929 Unspecified place or not applicable: Secondary | ICD-10-CM | POA: Diagnosis not present

## 2014-06-26 DIAGNOSIS — Z8659 Personal history of other mental and behavioral disorders: Secondary | ICD-10-CM | POA: Diagnosis not present

## 2014-06-26 DIAGNOSIS — Z79899 Other long term (current) drug therapy: Secondary | ICD-10-CM | POA: Diagnosis not present

## 2014-06-26 DIAGNOSIS — Z8614 Personal history of Methicillin resistant Staphylococcus aureus infection: Secondary | ICD-10-CM | POA: Diagnosis not present

## 2014-06-26 DIAGNOSIS — K219 Gastro-esophageal reflux disease without esophagitis: Secondary | ICD-10-CM | POA: Diagnosis not present

## 2014-06-26 DIAGNOSIS — S8990XA Unspecified injury of unspecified lower leg, initial encounter: Secondary | ICD-10-CM | POA: Insufficient documentation

## 2014-06-26 MED ORDER — HYDROMORPHONE HCL PF 1 MG/ML IJ SOLN: 1.0000 mg | Freq: Once | INTRAMUSCULAR | Status: DC

## 2014-06-26 MED ORDER — OXYCODONE HCL 5 MG PO TABS: 5.0000 mg | ORAL_TABLET | ORAL | Status: DC | PRN

## 2014-06-26 MED ORDER — ONDANSETRON 4 MG PO TBDP
4.0000 mg | ORAL_TABLET | Freq: Once | ORAL | Status: AC
  Administered 2014-06-26: 4 mg via ORAL
  Filled 2014-06-26: qty 1

## 2014-06-26 MED ORDER — HYDROMORPHONE HCL PF 1 MG/ML IJ SOLN
1.0000 mg | Freq: Once | INTRAMUSCULAR | Status: AC
  Administered 2014-06-26: 1 mg via INTRAMUSCULAR
  Filled 2014-06-26: qty 1

## 2014-06-26 NOTE — Discharge Instructions (Signed)

## 2014-06-26 NOTE — ED Notes (Signed)
Pt reports L ankle got pinned under a tractor tire today.  Having pain in ankle and lower back.

## 2014-06-26 NOTE — ED Provider Notes (Signed)
CSN: [id number]     Arrival date & time 06/26/14  2149 History  This chart was scribed for Virgel Manifold, MD by Irene Pap, ED Scribe. This patient was seen in room MH05/MH05 and patient care was started at 10:48 PM.   Chief Complaint  Patient presents with  . Ankle Injury   Patient is a 28 y.o. male presenting with lower extremity injury. The history is provided by the patient. No language interpreter was used.  Ankle Injury   HPI Comments: [name] is a 28 y.o. male who presents to the Emergency Department complaining of left ankle pain onset . He states that his ankle got pinned under a tractor tire and fell and hit his back on the spindle of the truck. He reports associated back pain and numbness in his toes. He denies being able to walk on the injured foot. He denies numbness or weakness to any other areas. He reports allergies to ibuprofen, tramadol and tylenol.   Past Medical History  Diagnosis Date  . Tobacco dipper   . GERD (gastroesophageal reflux disease)   . ADHD (attention deficit hyperactivity disorder)   . Hx MRSA infection 8/08    right thigh  . S/P colonoscopy 11/10, 10/08    Dr [name]  . S/P endoscopy 10/08, 2/10, 5/10    Dr Raliegh Scarlet reflux esophagitis, small HH  . Occult GI bleeding 12/2008    Trivial upper GI bleed/uncontrolled GERD by upper endoscopy 12/2008, normal f/u endoscopy 03/2009 with SBCE at that time  . Bipolar 1 disorder   . Esophagitis     Distal esophageal erosions consistent with mild erosive  reflux esophagitis 12/2008 by EGD   . Thyroid function test abnormal     Noted in 2011 discharge  . Obesity   . Gastric ulcer   . Hypercholesteremia    Past Surgical History  Procedure Laterality Date  . Vasectomy    . Ankle surgery    . Small bowel capsule  04/11/2009     normal throughout  . Esophagogastroduodenoscopy   04/07/2009    Normal esophagus, small hiatal hernia  . Esophagogastroduodenoscopy  01/09/2009    Distal  esophageal erosions consistent with mild erosive reflux esophagitis, otherwise normal esophagus, small hiatal herniaotherwise normal stomach, D1-D2   . Ileocolonoscopy  08/26/2007     A normal rectum, colon, and terminal ileum  . Esophagogastroduodenoscopy  08/26/2007    Normal esophagus, a small hiatal/hernia, otherwise normal stomach D1 through D3  . Colonoscopy  10/10/2009    anal papilla otherwise normal   Family History  Problem Relation Age of Onset  . Leukemia Father 75  . Seizures Mother   . Bipolar disorder Brother   . ADD / ADHD Brother    History  Substance Use Topics  . Smoking status: Never Smoker   . Smokeless tobacco: Current User    Types: Chew     Comment: DIPx 10 years  . Alcohol Use: Yes    Review of Systems  Musculoskeletal: Positive for arthralgias and back pain.  Neurological: Positive for numbness.  All other systems reviewed and are negative.  Allergies  Influenza vaccines; Tramadol; Ibuprofen; and Tylenol  Home Medications   Prior to Admission medications   Medication Sig Start Date End Date Taking? Authorizing Provider  Azelastine-Fluticasone (DYMISTA) 137-50 MCG/ACT SUSP Place 2 sprays into both nostrils at bedtime. 05/12/14   Deneise Lever, MD  clotrimazole (LOTRIMIN) 1 % cream Apply to affected area 2 times daily 04/11/14  Arville Lime Schinlever, PA-C  diclofenac sodium (VOLTAREN) 1 % GEL Apply 2 g topically 4 (four) times daily as needed (for pain in ankle). 08/19/13   Lysbeth Penner, FNP  oxyCODONE (ROXICODONE) 5 MG immediate release tablet Take 1 tablet (5 mg total) by mouth every 4 (four) hours as needed for severe pain. 03/01/14   Shari A Upstill, PA-C  pantoprazole (PROTONIX) 40 MG tablet Take 1 tablet (40 mg total) by mouth daily. 03/31/14 07/15/15  Lysbeth Penner, FNP  traZODone (DESYREL) 100 MG tablet Take 1 tablet by mouth at bedtime. 04/20/14   Historical Provider, MD   BP 127/73  Pulse 88  Temp(Src) 98.8 F (37.1 C) (Oral)  Resp 20   Ht 6' (1.829 m)  Wt 385 lb (174.635 kg)  BMI 52.20 kg/m2  SpO2 98% Physical Exam  Nursing note and vitals reviewed. Constitutional: He appears well-developed and well-nourished. No distress.  HENT:  Head: Normocephalic and atraumatic.  Eyes: Conjunctivae are normal. Right eye exhibits no discharge. Left eye exhibits no discharge.  Neck: Neck supple.  Cardiovascular: Normal rate, regular rhythm and normal heart sounds.  Exam reveals no gallop and no friction rub.   No murmur heard. Pulmonary/Chest: Effort normal and breath sounds normal. No respiratory distress.  Abdominal: Soft. He exhibits no distension. There is no tenderness.  Musculoskeletal: He exhibits no edema and no tenderness.  Tenderness to left lateral malleolus and across the talar dome. Skin intact. Palpable DP pulse. Sensation intact to light touch. No midline spinal tenderness.   Neurological: He is alert.  Skin: Skin is warm and dry.  Psychiatric: He has a normal mood and affect. His behavior is normal. Thought content normal.    ED Course  Procedures (including critical care time) DIAGNOSTIC STUDIES: Oxygen Saturation is 98% on room air, normal by my interpretation.    COORDINATION OF CARE: 10:51 PM-Discussed treatment plan which includes x-ray and pain medication with pt at bedside and pt agreed to plan.   Labs Review Labs Reviewed - No data to display  Imaging Review Dg Ankle Complete Left  06/26/2014   CLINICAL DATA:  Ankle injury after direct trauma.  EXAM: LEFT ANKLE COMPLETE - 3+ VIEW  COMPARISON:  01/28/2014  FINDINGS: There is no evidence of acute fracture, dislocation, or joint effusion. Mild soft tissue swelling noted anterior to the ankle, correlating with anterior pain. Degenerative insertional changes around the ankle - no joint narrowing.  IMPRESSION: No acute osseous findings.   Electronically Signed   By: Jorje Guild M.D.   On: 06/26/2014 23:00     EKG Interpretation None      MDM    Final diagnoses:  Ankle sprain, left, initial encounter  Back contusion, unspecified laterality, initial encounter    28yM with ankle and back pain. Ankle injury consistent with sprain. Imaging w/o acute osseous abnormality. Likely back contusion. Seriously doubt fx. Plan symptomatic tx.   I personally preformed the services scribed in my presence. The recorded information has been reviewed is accurate. Virgel Manifold, MD.    Virgel Manifold, MD 07/02/14 [phone]

## 2014-07-23 DIAGNOSIS — J302 Other seasonal allergic rhinitis: Secondary | ICD-10-CM | POA: Insufficient documentation

## 2014-07-23 DIAGNOSIS — J3089 Other allergic rhinitis: Secondary | ICD-10-CM

## 2014-07-23 NOTE — Assessment & Plan Note (Signed)
Avoidance of nicotine emphasized

## 2014-07-23 NOTE — Assessment & Plan Note (Signed)
We discussed the importance of CPAP compliance and reviewed basic sleep habits

## 2014-07-23 NOTE — Assessment & Plan Note (Signed)
Plan-advised Claritin, sample Dymista for trial

## 2014-09-22 ENCOUNTER — Encounter (HOSPITAL_BASED_OUTPATIENT_CLINIC_OR_DEPARTMENT_OTHER): Payer: Self-pay | Admitting: Emergency Medicine

## 2014-09-22 ENCOUNTER — Emergency Department (HOSPITAL_BASED_OUTPATIENT_CLINIC_OR_DEPARTMENT_OTHER)
Admission: EM | Admit: 2014-09-22 | Discharge: 2014-09-22 | Disposition: A | Discharge status: Home / Self Care | Payer: Medicaid Other | Attending: Emergency Medicine | Admitting: Emergency Medicine

## 2014-09-22 DIAGNOSIS — Z8614 Personal history of Methicillin resistant Staphylococcus aureus infection: Secondary | ICD-10-CM | POA: Insufficient documentation

## 2014-09-22 DIAGNOSIS — F909 Attention-deficit hyperactivity disorder, unspecified type: Secondary | ICD-10-CM | POA: Diagnosis not present

## 2014-09-22 DIAGNOSIS — Z79899 Other long term (current) drug therapy: Secondary | ICD-10-CM | POA: Insufficient documentation

## 2014-09-22 DIAGNOSIS — Z9889 Other specified postprocedural states: Secondary | ICD-10-CM | POA: Insufficient documentation

## 2014-09-22 DIAGNOSIS — E669 Obesity, unspecified: Secondary | ICD-10-CM | POA: Insufficient documentation

## 2014-09-22 DIAGNOSIS — K219 Gastro-esophageal reflux disease without esophagitis: Secondary | ICD-10-CM | POA: Insufficient documentation

## 2014-09-22 DIAGNOSIS — R197 Diarrhea, unspecified: Secondary | ICD-10-CM | POA: Insufficient documentation

## 2014-09-22 DIAGNOSIS — R6883 Chills (without fever): Secondary | ICD-10-CM | POA: Insufficient documentation

## 2014-09-22 DIAGNOSIS — F319 Bipolar disorder, unspecified: Secondary | ICD-10-CM | POA: Diagnosis not present

## 2014-09-22 DIAGNOSIS — Z720 Tobacco use: Secondary | ICD-10-CM | POA: Insufficient documentation

## 2014-09-22 DIAGNOSIS — R1084 Generalized abdominal pain: Secondary | ICD-10-CM | POA: Diagnosis not present

## 2014-09-22 DIAGNOSIS — R112 Nausea with vomiting, unspecified: Secondary | ICD-10-CM | POA: Diagnosis not present

## 2014-09-22 LAB — BASIC METABOLIC PANEL
Anion gap: 16 — ABNORMAL HIGH (ref 5–15)
BUN: 13 mg/dL (ref 6–23)
CHLORIDE: 101 meq/L (ref 96–112)
CO2: 27 mEq/L (ref 19–32)
Calcium: 10 mg/dL (ref 8.4–10.5)
Creatinine, Ser: 1 mg/dL (ref 0.50–1.35)
GFR calc Af Amer: >90 mL/min (ref >90)
GFR calc non Af Amer: >90 mL/min (ref >90)
Glucose, Bld: 100 mg/dL — ABNORMAL HIGH (ref 70–99)
Potassium: 4.4 mEq/L (ref 3.7–5.3)
SODIUM: 144 meq/L (ref 137–147)

## 2014-09-22 LAB — CBC WITH DIFFERENTIAL/PLATELET
BASOS PCT: 1 % (ref 0–1)
Basophils Absolute: 0.1 10*3/uL (ref 0.0–0.1)
Eosinophils Absolute: 0.2 10*3/uL (ref 0.0–0.7)
Eosinophils Relative: 2 % (ref 0–5)
HCT: 46.8 % (ref 39.0–52.0)
Hemoglobin: 15.8 g/dL (ref 13.0–17.0)
LYMPHS ABS: 2.4 10*3/uL (ref 0.7–4.0)
Lymphocytes Relative: 24 % (ref 12–46)
MCH: 28.1 pg (ref 26.0–34.0)
MCHC: 33.8 g/dL (ref 30.0–36.0)
MCV: 83.3 fL (ref 78.0–100.0)
MONOS PCT: 7 % (ref 3–12)
Monocytes Absolute: 0.7 10*3/uL (ref 0.1–1.0)
NEUTROS ABS: 6.7 10*3/uL (ref 1.7–7.7)
NEUTROS PCT: 66 % (ref 43–77)
Platelets: 276 10*3/uL (ref 150–400)
RBC: 5.62 MIL/uL (ref 4.22–5.81)
RDW: 14.2 % (ref 11.5–15.5)
WBC: 10.1 10*3/uL (ref 4.0–10.5)

## 2014-09-22 LAB — OCCULT BLOOD X 1 CARD TO LAB, STOOL: Fecal Occult Bld: NEGATIVE

## 2014-09-22 MED ORDER — ONDANSETRON 4 MG PO TBDP: 4.0000 mg | ORAL_TABLET | Freq: Three times a day (TID) | ORAL | Status: DC | PRN

## 2014-09-22 MED ORDER — LOPERAMIDE HCL 2 MG PO CAPS: 2.0000 mg | ORAL_CAPSULE | Freq: Four times a day (QID) | ORAL | Status: DC | PRN

## 2014-09-22 MED ORDER — FAMOTIDINE 20 MG PO TABS: 20.0000 mg | ORAL_TABLET | Freq: Two times a day (BID) | ORAL | Status: DC

## 2014-09-22 MED ORDER — ONDANSETRON 8 MG PO TBDP
8.0000 mg | ORAL_TABLET | Freq: Once | ORAL | Status: AC
  Administered 2014-09-22: 8 mg via ORAL
  Filled 2014-09-22: qty 1

## 2014-09-22 MED ORDER — MORPHINE SULFATE 4 MG/ML IJ SOLN
4.0000 mg | Freq: Once | INTRAMUSCULAR | Status: AC
Start: 2014-09-22 — End: 2014-09-22
  Administered 2014-09-22: 4 mg via INTRAMUSCULAR
  Filled 2014-09-22: qty 1

## 2014-09-22 NOTE — ED Provider Notes (Signed)
CSN: [id number]     Arrival date & time 09/22/14  1439 History   First MD Initiated Contact with Patient 09/22/14 1539     Chief Complaint  Patient presents with  . Diarrhea     (Consider location/radiation/quality/duration/timing/severity/associated sxs/prior Treatment) HPI Comments: Patient is a 28 year old male past medical history significant for tobacco abuse, GERD, ADHD, history of occult GI bleed, obesity, gastric ulcer, esophagitis presenting to the emergency department for 2 day history of generalized abdominal pain, nausea, nonbloody nonbilious emesis, multiple episodes of diarrhea. He reports a few episodes of bright red blood per rectum with diarrhea. Denies any dark or black stools. No alleviating or aggravating factors. Last colonoscopy and endoscopy was in 2010 with negative follow-up from earlier in the year. No abdominal surgical history.   Past Medical History  Diagnosis Date  . Tobacco dipper   . GERD (gastroesophageal reflux disease)   . ADHD (attention deficit hyperactivity disorder)   . Hx MRSA infection 8/08    right thigh  . S/P colonoscopy 11/10, 10/08    Dr [name]  . S/P endoscopy 10/08, 2/10, 5/10    Dr Raliegh Scarlet reflux esophagitis, small HH  . Occult GI bleeding 12/2008    Trivial upper GI bleed/uncontrolled GERD by upper endoscopy 12/2008, normal f/u endoscopy 03/2009 with SBCE at that time  . Bipolar 1 disorder   . Esophagitis     Distal esophageal erosions consistent with mild erosive  reflux esophagitis 12/2008 by EGD   . Thyroid function test abnormal     Noted in 2011 discharge  . Obesity   . Gastric ulcer   . Hypercholesteremia    Past Surgical History  Procedure Laterality Date  . Vasectomy    . Ankle surgery    . Small bowel capsule  04/11/2009     normal throughout  . Esophagogastroduodenoscopy   04/07/2009    Normal esophagus, small hiatal hernia  . Esophagogastroduodenoscopy  01/09/2009    Distal esophageal erosions  consistent with mild erosive reflux esophagitis, otherwise normal esophagus, small hiatal herniaotherwise normal stomach, D1-D2   . Ileocolonoscopy  08/26/2007     A normal rectum, colon, and terminal ileum  . Esophagogastroduodenoscopy  08/26/2007    Normal esophagus, a small hiatal/hernia, otherwise normal stomach D1 through D3  . Colonoscopy  10/10/2009    anal papilla otherwise normal   Family History  Problem Relation Age of Onset  . Leukemia Father 58  . Seizures Mother   . Bipolar disorder Brother   . ADD / ADHD Brother    History  Substance Use Topics  . Smoking status: Never Smoker   . Smokeless tobacco: Current User    Types: Chew     Comment: DIPx 10 years  . Alcohol Use: Yes     Comment: weekends    Review of Systems  Constitutional: Positive for chills.  Gastrointestinal: Positive for nausea, vomiting, abdominal pain, diarrhea and blood in stool.  All other systems reviewed and are negative.     Allergies  Influenza vaccines; Tramadol; Ibuprofen; and Tylenol  Home Medications   Prior to Admission medications   Medication Sig Start Date End Date Taking? Authorizing Provider  oxyCODONE (ROXICODONE) 5 MG immediate release tablet Take 1 tablet (5 mg total) by mouth every 4 (four) hours as needed for severe pain. 03/01/14  Yes Shari A Upstill, PA-C  oxyCODONE (ROXICODONE) 5 MG immediate release tablet Take 1 tablet (5 mg total) by mouth every 4 (four) hours as needed for severe  pain. 06/26/14  Yes Virgel Manifold, MD  Azelastine-Fluticasone [facility]) 137-50 MCG/ACT SUSP Place 2 sprays into both nostrils at bedtime. 05/12/14   Deneise Lever, MD  clotrimazole (LOTRIMIN) 1 % cream Apply to affected area 2 times daily 04/11/14   Arville Lime Schinlever, PA-C  diclofenac sodium (VOLTAREN) 1 % GEL Apply 2 g topically 4 (four) times daily as needed (for pain in ankle). 08/19/13   Lysbeth Penner, FNP  famotidine (PEPCID) 20 MG tablet Take 1 tablet (20 mg total) by mouth 2 (two)  times daily. 09/22/14   Bobbyjo Marulanda L Pati Thinnes, PA-C  loperamide (IMODIUM) 2 MG capsule Take 1 capsule (2 mg total) by mouth 4 (four) times daily as needed for diarrhea or loose stools. 09/22/14   Tyleah Loh L Marajade Lei, PA-C  ondansetron (ZOFRAN ODT) 4 MG disintegrating tablet Take 1 tablet (4 mg total) by mouth every 8 (eight) hours as needed for nausea. 09/22/14   Kindell Strada L Shantella Blubaugh, PA-C  pantoprazole (PROTONIX) 40 MG tablet Take 1 tablet (40 mg total) by mouth daily. 03/31/14 07/15/15  Lysbeth Penner, FNP  traZODone (DESYREL) 100 MG tablet Take 1 tablet by mouth at bedtime. 04/20/14   Historical Provider, MD   BP 111/55  Pulse 81  Temp(Src) 98.8 F (37.1 C) (Oral)  Resp 16  Ht 6' (1.829 m)  Wt 360 lb (163.295 kg)  BMI 48.81 kg/m2  SpO2 100% Physical Exam  Nursing note and vitals reviewed. Constitutional: He is oriented to person, place, and time. He appears well-developed and well-nourished. No distress.  HENT:  Head: Normocephalic and atraumatic.  Right Ear: External ear normal.  Left Ear: External ear normal.  Nose: Nose normal.  Mouth/Throat: Oropharynx is clear and moist.  Eyes: Conjunctivae are normal.  Neck: Normal range of motion. Neck supple.  Cardiovascular: Normal rate, regular rhythm and normal heart sounds.   Pulmonary/Chest: Effort normal and breath sounds normal. No respiratory distress.  Abdominal: Soft. Bowel sounds are normal. He exhibits no distension. There is no tenderness. There is no rigidity, no rebound and no guarding.  Genitourinary: Rectum normal. Guaiac negative stool.  Brown stool on DRE.   Musculoskeletal: Normal range of motion.  Neurological: He is alert and oriented to person, place, and time.  Skin: Skin is warm and dry. He is not diaphoretic.  Psychiatric: He has a normal mood and affect.    ED Course  Procedures (including critical care time) Medications  morphine 4 MG/ML injection 4 mg (4 mg Intramuscular Given 09/22/14 1613)   ondansetron (ZOFRAN-ODT) disintegrating tablet 8 mg (8 mg Oral Given 09/22/14 1707)    Labs Review Labs Reviewed  BASIC METABOLIC PANEL - Abnormal; Notable for the following:    Glucose, Bld 100 (*)    Anion gap 16 (*)    All other components within normal limits  CBC WITH DIFFERENTIAL  OCCULT BLOOD X 1 CARD TO LAB, STOOL    Imaging Review No results found.   EKG Interpretation None      MDM   Final diagnoses:  Nausea vomiting and diarrhea  Abdominal pain, generalized    Filed Vitals:   09/22/14 1708  BP: 111/55  Pulse: 81  Temp: 98.8 F (37.1 C)  Resp: 16   Afebrile, NAD, non-toxic appearing, AAOx4. Patient is nontoxic, nonseptic appearing, in no apparent distress.  Patient's pain and other symptoms adequately managed in emergency department. Patient does not have a surgical abdomin and there are no peritoneal signs.  No indication of appendicitis, bowel obstruction,  bowel perforation, cholecystitis, diverticulitis. Brown stool on DRE. Hemoccult negative. Patient with symptoms consistent with viral gastroenteritis.  Vitals are stable, no fever.  No signs of dehydration, tolerating PO fluids > 6 oz.  Lungs are clear.  No focal abdominal pain, no concern for appendicitis, cholecystitis, pancreatitis, ruptured viscus, UTI, kidney stone, or any other abdominal etiology.  Supportive therapy indicated with return if symptoms worsen.  Patient counseled. I have also discussed reasons to return immediately to the ER.  Patient expresses understanding and agrees with plan. Patient is stable at time of discharge      Harlow Mares, PA-C 09/22/14 2122

## 2014-09-22 NOTE — Discharge Instructions (Signed)
Please follow up with your primary care physician in 1-2 days. If you do not have one please call the [facility] number listed above. Please take medications as prescribed. Please read all discharge instructions and return precautions.   Viral Gastroenteritis Viral gastroenteritis is also known as stomach flu. This condition affects the stomach and intestinal tract. It can cause sudden diarrhea and vomiting. The illness typically lasts 3 to 8 days. Most people develop an immune response that eventually gets rid of the virus. While this natural response develops, the virus can make you quite ill. CAUSES  Many different viruses can cause gastroenteritis, such as rotavirus or noroviruses. You can catch one of these viruses by consuming contaminated food or water. You may also catch a virus by sharing utensils or other personal items with an infected person or by touching a contaminated surface. SYMPTOMS  The most common symptoms are diarrhea and vomiting. These problems can cause a severe loss of body fluids (dehydration) and a body salt (electrolyte) imbalance. Other symptoms may include:  Fever.  Headache.  Fatigue.  Abdominal pain. DIAGNOSIS  Your caregiver can usually diagnose viral gastroenteritis based on your symptoms and a physical exam. A stool sample may also be taken to test for the presence of viruses or other infections. TREATMENT  This illness typically goes away on its own. Treatments are aimed at rehydration. The most serious cases of viral gastroenteritis involve vomiting so severely that you are not able to keep fluids down. In these cases, fluids must be given through an intravenous line (IV). HOME CARE INSTRUCTIONS   Drink enough fluids to keep your urine clear or pale yellow. Drink small amounts of fluids frequently and increase the amounts as tolerated.  Ask your caregiver for specific rehydration instructions.  Avoid:  Foods high in  sugar.  Alcohol.  Carbonated drinks.  Tobacco.  Juice.  Caffeine drinks.  Extremely hot or cold fluids.  Fatty, greasy foods.  Too much intake of anything at one time.  Dairy products until 24 to 48 hours after diarrhea stops.  You may consume probiotics. Probiotics are active cultures of beneficial bacteria. They may lessen the amount and number of diarrheal stools in adults. Probiotics can be found in yogurt with active cultures and in supplements.  Wash your hands well to avoid spreading the virus.  Only take over-the-counter or prescription medicines for pain, discomfort, or fever as directed by your caregiver. Do not give aspirin to children. Antidiarrheal medicines are not recommended.  Ask your caregiver if you should continue to take your regular prescribed and over-the-counter medicines.  Keep all follow-up appointments as directed by your caregiver. SEEK IMMEDIATE MEDICAL CARE IF:   You are unable to keep fluids down.  You do not urinate at least once every 6 to 8 hours.  You develop shortness of breath.  You notice blood in your stool or vomit. This may look like coffee grounds.  You have abdominal pain that increases or is concentrated in one small area (localized).  You have persistent vomiting or diarrhea.  You have a fever.  The patient is a child younger than 3 months, and he or she has a fever.  The patient is a child older than 3 months, and he or she has a fever and persistent symptoms.  The patient is a child older than 3 months, and he or she has a fever and symptoms suddenly get worse.  The patient is a baby, and he or  she has no tears when crying. MAKE SURE YOU:   Understand these instructions.  Will watch your condition.  Will get help right away if you are not doing well or get worse. Document Released: 11/11/2005 Document Revised: 02/03/2012 Document Reviewed: 08/28/2011 [facility] Patient Information 2015 [place], [place]. This  information is not intended to replace advice given to you by your health care provider. Make sure you discuss any questions you have with your health care provider.

## 2014-09-22 NOTE — ED Provider Notes (Signed)
Medical screening examination/treatment/procedure(s) were performed by non-physician practitioner and as supervising physician I was immediately available for consultation/collaboration.   EKG Interpretation None        Delice Bison Sharetha Newson, DO 09/22/14 2201

## 2014-09-22 NOTE — ED Notes (Signed)
Rectal bleeding, abdominal pain, diarrhea and vomiting since yesterday.

## 2014-09-27 ENCOUNTER — Encounter (HOSPITAL_BASED_OUTPATIENT_CLINIC_OR_DEPARTMENT_OTHER): Payer: Self-pay | Admitting: *Deleted

## 2014-09-27 ENCOUNTER — Emergency Department (HOSPITAL_BASED_OUTPATIENT_CLINIC_OR_DEPARTMENT_OTHER): Payer: Medicaid Other

## 2014-09-27 ENCOUNTER — Emergency Department (HOSPITAL_BASED_OUTPATIENT_CLINIC_OR_DEPARTMENT_OTHER)
Admission: EM | Admit: 2014-09-27 | Discharge: 2014-09-27 | Disposition: A | Discharge status: Home / Self Care | Payer: Medicaid Other | Attending: Emergency Medicine | Admitting: Emergency Medicine

## 2014-09-27 DIAGNOSIS — Z9889 Other specified postprocedural states: Secondary | ICD-10-CM | POA: Insufficient documentation

## 2014-09-27 DIAGNOSIS — R109 Unspecified abdominal pain: Secondary | ICD-10-CM

## 2014-09-27 DIAGNOSIS — R112 Nausea with vomiting, unspecified: Secondary | ICD-10-CM

## 2014-09-27 DIAGNOSIS — R630 Anorexia: Secondary | ICD-10-CM | POA: Insufficient documentation

## 2014-09-27 DIAGNOSIS — Z8614 Personal history of Methicillin resistant Staphylococcus aureus infection: Secondary | ICD-10-CM | POA: Insufficient documentation

## 2014-09-27 DIAGNOSIS — F319 Bipolar disorder, unspecified: Secondary | ICD-10-CM | POA: Insufficient documentation

## 2014-09-27 DIAGNOSIS — R197 Diarrhea, unspecified: Secondary | ICD-10-CM | POA: Insufficient documentation

## 2014-09-27 DIAGNOSIS — R531 Weakness: Secondary | ICD-10-CM | POA: Insufficient documentation

## 2014-09-27 DIAGNOSIS — E669 Obesity, unspecified: Secondary | ICD-10-CM | POA: Insufficient documentation

## 2014-09-27 DIAGNOSIS — K219 Gastro-esophageal reflux disease without esophagitis: Secondary | ICD-10-CM | POA: Insufficient documentation

## 2014-09-27 DIAGNOSIS — Z79899 Other long term (current) drug therapy: Secondary | ICD-10-CM | POA: Insufficient documentation

## 2014-09-27 LAB — URINALYSIS, ROUTINE W REFLEX MICROSCOPIC
Bilirubin Urine: NEGATIVE
GLUCOSE, UA: NEGATIVE mg/dL
HGB URINE DIPSTICK: NEGATIVE
Ketones, ur: NEGATIVE mg/dL
Leukocytes, UA: NEGATIVE
Nitrite: NEGATIVE
PH: 6 (ref 5.0–8.0)
PROTEIN: NEGATIVE mg/dL
Specific Gravity, Urine: 1.027 (ref 1.005–1.030)
Urobilinogen, UA: 0.2 mg/dL (ref 0.0–1.0)

## 2014-09-27 LAB — COMPREHENSIVE METABOLIC PANEL
ALK PHOS: 58 U/L (ref 39–117)
ALT: 38 U/L (ref 0–53)
AST: 23 U/L (ref 0–37)
Albumin: 3.9 g/dL (ref 3.5–5.2)
Anion gap: 11 (ref 5–15)
BUN: 10 mg/dL (ref 6–23)
CHLORIDE: 101 meq/L (ref 96–112)
CO2: 30 mEq/L (ref 19–32)
Calcium: 9.7 mg/dL (ref 8.4–10.5)
Creatinine, Ser: 1 mg/dL (ref 0.50–1.35)
GFR calc Af Amer: >90 mL/min (ref >90)
GLUCOSE: 82 mg/dL (ref 70–99)
POTASSIUM: 4.1 meq/L (ref 3.7–5.3)
SODIUM: 142 meq/L (ref 137–147)
Total Bilirubin: 0.5 mg/dL (ref 0.3–1.2)
Total Protein: 7 g/dL (ref 6.0–8.3)

## 2014-09-27 LAB — CBC WITH DIFFERENTIAL/PLATELET
Basophils Absolute: 0.1 10*3/uL (ref 0.0–0.1)
Basophils Relative: 1 % (ref 0–1)
EOS PCT: 1 % (ref 0–5)
Eosinophils Absolute: 0.2 10*3/uL (ref 0.0–0.7)
HCT: 44.7 % (ref 39.0–52.0)
Hemoglobin: 14.9 g/dL (ref 13.0–17.0)
LYMPHS ABS: 3.4 10*3/uL (ref 0.7–4.0)
Lymphocytes Relative: 29 % (ref 12–46)
MCH: 28 pg (ref 26.0–34.0)
MCHC: 33.3 g/dL (ref 30.0–36.0)
MCV: 84 fL (ref 78.0–100.0)
Monocytes Absolute: 0.9 10*3/uL (ref 0.1–1.0)
Monocytes Relative: 8 % (ref 3–12)
NEUTROS PCT: 61 % (ref 43–77)
Neutro Abs: 7.2 10*3/uL (ref 1.7–7.7)
Platelets: 277 10*3/uL (ref 150–400)
RBC: 5.32 MIL/uL (ref 4.22–5.81)
RDW: 14.3 % (ref 11.5–15.5)
WBC: 11.8 10*3/uL — AB (ref 4.0–10.5)

## 2014-09-27 LAB — LIPASE, BLOOD: Lipase: 79 U/L — ABNORMAL HIGH (ref 11–59)

## 2014-09-27 LAB — OCCULT BLOOD X 1 CARD TO LAB, STOOL: Fecal Occult Bld: POSITIVE — AB

## 2014-09-27 MED ORDER — OXYCODONE HCL 5 MG PO TABS: 5.0000 mg | ORAL_TABLET | ORAL | Status: DC | PRN

## 2014-09-27 MED ORDER — ONDANSETRON HCL 4 MG/2ML IJ SOLN
4.0000 mg | Freq: Once | INTRAMUSCULAR | Status: AC
  Administered 2014-09-27: 4 mg via INTRAVENOUS
  Filled 2014-09-27: qty 2

## 2014-09-27 MED ORDER — ONDANSETRON HCL 4 MG PO TABS: 4.0000 mg | ORAL_TABLET | Freq: Four times a day (QID) | ORAL | Status: DC

## 2014-09-27 MED ORDER — SODIUM CHLORIDE 0.9 % IV BOLUS (SEPSIS)
1000.0000 mL | Freq: Once | INTRAVENOUS | Status: AC
  Administered 2014-09-27: 1000 mL via INTRAVENOUS

## 2014-09-27 MED ORDER — IOHEXOL 300 MG/ML  SOLN
25.0000 mL | Freq: Once | INTRAMUSCULAR | Status: AC | PRN
  Administered 2014-09-27: 25 mL via ORAL

## 2014-09-27 MED ORDER — IOHEXOL 300 MG/ML  SOLN
100.0000 mL | Freq: Once | INTRAMUSCULAR | Status: AC | PRN
  Administered 2014-09-27: 100 mL via INTRAVENOUS

## 2014-09-27 MED ORDER — MORPHINE SULFATE 4 MG/ML IJ SOLN
4.0000 mg | Freq: Once | INTRAMUSCULAR | Status: AC
  Administered 2014-09-27: 4 mg via INTRAVENOUS
  Filled 2014-09-27: qty 1

## 2014-09-27 NOTE — Discharge Instructions (Signed)
Nausea and Vomiting Take your stomach medications. Followup with Dr. Gala Gardner. Return to the ED if you develop new or worsening symptoms. Nausea is a sick feeling that often comes before throwing up (vomiting). Vomiting is a reflex where stomach contents come out of your mouth. Vomiting can cause severe loss of body fluids (dehydration). Children and elderly adults can become dehydrated quickly, especially if they also have diarrhea. Nausea and vomiting are symptoms of a condition or disease. It is important to find the cause of your symptoms. CAUSES   Direct irritation of the stomach lining. This irritation can result from increased acid production (gastroesophageal reflux disease), infection, food poisoning, taking certain medicines (such as nonsteroidal anti-inflammatory drugs), alcohol use, or tobacco use.  Signals from the brain.These signals could be caused by a headache, heat exposure, an inner ear disturbance, increased pressure in the brain from injury, infection, a tumor, or a concussion, pain, emotional stimulus, or metabolic problems.  An obstruction in the gastrointestinal tract (bowel obstruction).  Illnesses such as diabetes, hepatitis, gallbladder problems, appendicitis, kidney problems, cancer, sepsis, atypical symptoms of a heart attack, or eating disorders.  Medical treatments such as chemotherapy and radiation.  Receiving medicine that makes you sleep (general anesthetic) during surgery. DIAGNOSIS Your caregiver may ask for tests to be done if the problems do not improve after a few days. Tests may also be done if symptoms are severe or if the reason for the nausea and vomiting is not clear. Tests may include:  Urine tests.  Blood tests.  Stool tests.  Cultures (to look for evidence of infection).  X-rays or other imaging studies. Test results can help your caregiver make decisions about treatment or the need for additional tests. TREATMENT You need to stay well  hydrated. Drink frequently but in small amounts.You may wish to drink water, sports drinks, clear broth, or eat frozen ice pops or gelatin dessert to help stay hydrated.When you eat, eating slowly may help prevent nausea.There are also some antinausea medicines that may help prevent nausea. HOME CARE INSTRUCTIONS   Take all medicine as directed by your caregiver.  If you do not have an appetite, do not force yourself to eat. However, you must continue to drink fluids.  If you have an appetite, eat a normal diet unless your caregiver tells you differently.  Eat a variety of complex carbohydrates (rice, wheat, potatoes, bread), lean meats, yogurt, fruits, and vegetables.  Avoid high-fat foods because they are more difficult to digest.  Drink enough water and fluids to keep your urine clear or pale yellow.  If you are dehydrated, ask your caregiver for specific rehydration instructions. Signs of dehydration may include:  Severe thirst.  Dry lips and mouth.  Dizziness.  Dark urine.  Decreasing urine frequency and amount.  Confusion.  Rapid breathing or pulse. SEEK IMMEDIATE MEDICAL CARE IF:   You have blood or brown flecks (like coffee grounds) in your vomit.  You have black or bloody stools.  You have a severe headache or stiff neck.  You are confused.  You have severe abdominal pain.  You have chest pain or trouble breathing.  You do not urinate at least once every 8 hours.  You develop cold or clammy skin.  You continue to vomit for longer than 24 to 48 hours.  You have a fever. MAKE SURE YOU:   Understand these instructions.  Will watch your condition.  Will get help right away if you are not doing well or get worse. Document  Released: 11/11/2005 Document Revised: 02/03/2012 Document Reviewed: 04/10/2011 [facility] Patient Information 2015 [place], [place]. This information is not intended to replace advice given to you by your health care provider. Make  sure you discuss any questions you have with your health care provider.

## 2014-09-27 NOTE — ED Notes (Signed)
Pt given coke to drink 

## 2014-09-27 NOTE — ED Notes (Signed)
MD at bedside. 

## 2014-09-27 NOTE — ED Notes (Addendum)
Pt c/o n/v/d x 1 week seen here last week for same

## 2014-09-27 NOTE — ED Notes (Signed)
EDP with d/c-advised pt received 500cc of 2nd NS1000 cc bolus-order ok to d/c pt with 1500cc infused

## 2014-09-27 NOTE — ED Provider Notes (Signed)
CSN: [id number]     Arrival date & time 09/27/14  1626 History   First MD Initiated Contact with Patient 09/27/14 1735     Chief Complaint  Patient presents with  . Emesis     (Consider location/radiation/quality/duration/timing/severity/associated sxs/prior Treatment) HPI Comments: Patient presents with a five-day history of nausea, vomiting, diarrhea and abdominal pain. He was seen in the ED on October 29 and diagnosed with gastroenteritis. He states he is doing no better. He's had one episode of vomiting today and yesterday. Denies any blood in his vomit. He had multiple episodes of diarrhea throughout the day that have been nonbloody.is diffuse crampy abdominal pain that is worse with palpation. Endorses poor appetite. He had a few episodes of bright red blood per rectum last week but none today. History of esophagitis on endoscopy in 2010. Denies any alcohol or NSAID abuse. No previous abdominal surgeries. No chest pain or shortness of breath. No sick contacts or recent travel or antibiotic use.  Patient is a 28 y.o. male presenting with vomiting. The history is provided by the patient.  Emesis Associated symptoms: abdominal pain, arthralgias, diarrhea and myalgias     Past Medical History  Diagnosis Date  . Tobacco dipper   . GERD (gastroesophageal reflux disease)   . ADHD (attention deficit hyperactivity disorder)   . Hx MRSA infection 8/08    right thigh  . S/P colonoscopy 11/10, 10/08    Dr [name]  . S/P endoscopy 10/08, 2/10, 5/10    Dr Raliegh Scarlet reflux esophagitis, small HH  . Occult GI bleeding 12/2008    Trivial upper GI bleed/uncontrolled GERD by upper endoscopy 12/2008, normal f/u endoscopy 03/2009 with SBCE at that time  . Bipolar 1 disorder   . Esophagitis     Distal esophageal erosions consistent with mild erosive  reflux esophagitis 12/2008 by EGD   . Thyroid function test abnormal     Noted in 2011 discharge  . Obesity   . Gastric ulcer   .  Hypercholesteremia    Past Surgical History  Procedure Laterality Date  . Vasectomy    . Ankle surgery    . Small bowel capsule  04/11/2009     normal throughout  . Esophagogastroduodenoscopy   04/07/2009    Normal esophagus, small hiatal hernia  . Esophagogastroduodenoscopy  01/09/2009    Distal esophageal erosions consistent with mild erosive reflux esophagitis, otherwise normal esophagus, small hiatal herniaotherwise normal stomach, D1-D2   . Ileocolonoscopy  08/26/2007     A normal rectum, colon, and terminal ileum  . Esophagogastroduodenoscopy  08/26/2007    Normal esophagus, a small hiatal/hernia, otherwise normal stomach D1 through D3  . Colonoscopy  10/10/2009    anal papilla otherwise normal   Family History  Problem Relation Age of Onset  . Leukemia Father 45  . Seizures Mother   . Bipolar disorder Brother   . ADD / ADHD Brother    History  Substance Use Topics  . Smoking status: Never Smoker   . Smokeless tobacco: Current User    Types: Chew     Comment: DIPx 10 years  . Alcohol Use: Yes     Comment: weekends    Review of Systems  Constitutional: Positive for activity change, appetite change and fatigue. Negative for fever.  Eyes: Negative for visual disturbance.  Respiratory: Negative for cough, chest tightness and shortness of breath.   Cardiovascular: Negative for chest pain.  Gastrointestinal: Positive for nausea, vomiting, abdominal pain and diarrhea.  Genitourinary: Negative  for dysuria and hematuria.  Musculoskeletal: Positive for myalgias and arthralgias. Negative for back pain.  Neurological: Positive for weakness. Negative for dizziness, light-headedness and numbness.  A complete 10 system review of systems was obtained and all systems are negative except as noted in the HPI and PMH.      Allergies  Influenza vaccines; Tramadol; Ibuprofen; and Tylenol  Home Medications   Prior to Admission medications   Medication Sig Start Date End Date  Taking? Authorizing Provider  citalopram (CELEXA) 10 MG tablet Take 10 mg by mouth daily.   Yes Historical Provider, MD  hydrOXYzine (ATARAX/VISTARIL) 25 MG tablet Take 25 mg by mouth daily.   Yes Historical Provider, MD  loperamide (IMODIUM) 2 MG capsule Take 1 capsule (2 mg total) by mouth 4 (four) times daily as needed for diarrhea or loose stools. 09/22/14  Yes Jennifer L Piepenbrink, PA-C  ondansetron (ZOFRAN ODT) 4 MG disintegrating tablet Take 1 tablet (4 mg total) by mouth every 8 (eight) hours as needed for nausea. 09/22/14  Yes Jennifer L Piepenbrink, PA-C  OXcarbazepine (TRILEPTAL) 150 MG tablet Take 150 mg by mouth 2 (two) times daily.   Yes Historical Provider, MD  Azelastine-Fluticasone (DYMISTA) 137-50 MCG/ACT SUSP Place 2 sprays into both nostrils at bedtime. 05/12/14   Deneise Lever, MD  clotrimazole (LOTRIMIN) 1 % cream Apply to affected area 2 times daily 04/11/14   Arville Lime Schinlever, PA-C  diclofenac sodium (VOLTAREN) 1 % GEL Apply 2 g topically 4 (four) times daily as needed (for pain in ankle). 08/19/13   Lysbeth Penner, FNP  famotidine (PEPCID) 20 MG tablet Take 1 tablet (20 mg total) by mouth 2 (two) times daily. 09/22/14   Jennifer L Piepenbrink, PA-C  ondansetron (ZOFRAN) 4 MG tablet Take 1 tablet (4 mg total) by mouth every 6 (six) hours. 09/27/14   Ezequiel Essex, MD  oxyCODONE (ROXICODONE) 5 MG immediate release tablet Take 1 tablet (5 mg total) by mouth every 4 (four) hours as needed for severe pain. 09/27/14   Ezequiel Essex, MD  pantoprazole (PROTONIX) 40 MG tablet Take 1 tablet (40 mg total) by mouth daily. 03/31/14 07/15/15  Lysbeth Penner, FNP   BP 128/67 mmHg  Pulse 69  Temp(Src) 98.2 F (36.8 C) (Oral)  Resp 18  Ht 6' (1.829 m)  Wt 360 lb (163.295 kg)  BMI 48.81 kg/m2  SpO2 98% Physical Exam  Constitutional: He is oriented to person, place, and time. He appears well-developed and well-nourished. No distress.  HENT:  Head: Normocephalic and  atraumatic.  Mouth/Throat: Oropharynx is clear and moist. No oropharyngeal exudate.  Eyes: Conjunctivae and EOM are normal. Pupils are equal, round, and reactive to light.  Neck: Normal range of motion. Neck supple.  No meningismus.  Cardiovascular: Normal rate, regular rhythm, normal heart sounds and intact distal pulses.   No murmur heard. Pulmonary/Chest: Effort normal and breath sounds normal. No respiratory distress.  Abdominal: Soft. There is tenderness. There is no rebound and no guarding.  Diffuse tenderness without peritoneal signs  Genitourinary:  Brown stool, no gross blood, no hemorrhoids, chaperone present  Musculoskeletal: Normal range of motion. He exhibits no edema or tenderness.  Neurological: He is alert and oriented to person, place, and time. No cranial nerve deficit. He exhibits normal muscle tone. Coordination normal.  No ataxia on finger to nose bilaterally. No pronator drift. 5/5 strength throughout. CN 2-12 intact. Negative Romberg. Equal grip strength. Sensation intact. Gait is normal.   Skin: Skin is warm.  Psychiatric: He has a normal mood and affect. His behavior is normal.  Nursing note and vitals reviewed.   ED Course  Procedures (including critical care time) Labs Review Labs Reviewed  CBC WITH DIFFERENTIAL - Abnormal; Notable for the following:    WBC 11.8 (*)    All other components within normal limits  LIPASE, BLOOD - Abnormal; Notable for the following:    Lipase 79 (*)    All other components within normal limits  OCCULT BLOOD X 1 CARD TO LAB, STOOL - Abnormal; Notable for the following:    Fecal Occult Bld POSITIVE (*)    All other components within normal limits  COMPREHENSIVE METABOLIC PANEL  URINALYSIS, ROUTINE W REFLEX MICROSCOPIC    Imaging Review Ct Abdomen Pelvis W Contrast  09/27/2014   CLINICAL DATA:  Initial encounter. Nausea and diarrhea. Abdominal pain since last Thursday.  EXAM: CT ABDOMEN AND PELVIS WITH CONTRAST  TECHNIQUE:  Multidetector CT imaging of the abdomen and pelvis was performed using the standard protocol following bolus administration of intravenous contrast.  CONTRAST:  32mL OMNIPAQUE IOHEXOL 300 MG/ML SOLN, 14mL OMNIPAQUE IOHEXOL 300 MG/ML SOLN  COMPARISON:  11/06/2012 CT.  FINDINGS: Musculoskeletal: L5-S1 age advanced disc degeneration. Disc osteophyte complex at L5-S1. No aggressive osseous lesions.  Lung Bases: Dependent atelectasis.  Liver:  Normal.  Spleen:  Normal.  Gallbladder:  Partially contracted.  Common bile duct:  Normal.  Pancreas:  Normal.  Adrenal glands:  Normal bilaterally.  Kidneys: Normal enhancement. Proximally 3 cm cyst in the LEFT inferior renal pole. This appears unchanged compared to prior study from 2013. Both ureters appear within normal limits.  Stomach:  Normal.  Small bowel:  Normal.  Colon: Appendix not identified. No RIGHT lower quadrant inflammatory changes.  Pelvic Genitourinary:  Collapsed urinary bladder.  Vasculature: Within normal limits.  Body Wall: Normal.  IMPRESSION: Negative CT abdomen and pelvis.   Electronically Signed   By: Dereck Ligas M.D.   On: 09/27/2014 18:43     EKG Interpretation None      MDM   Final diagnoses:  Abdominal pain  Nausea vomiting and diarrhea   Ongoing diarrhea, nausea, vomiting, abdominal pain for the past 5 days. Abdomen soft without peritoneal signs  Hemoglobin stable. Lipase 79. Fecal occult blood positive. Orthostatics are negative. CT is negative.  Patient does not have any evidence of active GI bleed. He has a known history of esophagitis on last endoscopy. He is tolerating PO. No vomiting in the ED. Vitals stable. Continue PPI and H2 blockers.  Needs follow up with PCP and possibily GI for repeat endoscopy.  Avoid alcohol, caffeine, NSAIDS, spicy foods.  BP 128/67 mmHg  Pulse 69  Temp(Src) 98.2 F (36.8 C) (Oral)  Resp 18  Ht 6' (1.829 m)  Wt 360 lb (163.295 kg)  BMI 48.81 kg/m2  SpO2 98%   Ezequiel Essex,  MD 09/28/14 [phone]

## 2014-10-21 ENCOUNTER — Emergency Department (HOSPITAL_BASED_OUTPATIENT_CLINIC_OR_DEPARTMENT_OTHER)
Admission: EM | Admit: 2014-10-21 | Discharge: 2014-10-21 | Disposition: A | Discharge status: Home / Self Care | Payer: Medicaid Other | Attending: Emergency Medicine | Admitting: Emergency Medicine

## 2014-10-21 ENCOUNTER — Encounter (HOSPITAL_BASED_OUTPATIENT_CLINIC_OR_DEPARTMENT_OTHER): Payer: Self-pay

## 2014-10-21 DIAGNOSIS — M62830 Muscle spasm of back: Secondary | ICD-10-CM

## 2014-10-21 DIAGNOSIS — E669 Obesity, unspecified: Secondary | ICD-10-CM | POA: Insufficient documentation

## 2014-10-21 DIAGNOSIS — Z79899 Other long term (current) drug therapy: Secondary | ICD-10-CM | POA: Insufficient documentation

## 2014-10-21 DIAGNOSIS — F3110 Bipolar disorder, current episode manic without psychotic features, unspecified: Secondary | ICD-10-CM | POA: Insufficient documentation

## 2014-10-21 DIAGNOSIS — Z8659 Personal history of other mental and behavioral disorders: Secondary | ICD-10-CM | POA: Insufficient documentation

## 2014-10-21 DIAGNOSIS — K219 Gastro-esophageal reflux disease without esophagitis: Secondary | ICD-10-CM | POA: Insufficient documentation

## 2014-10-21 HISTORY — DX: Diaphragmatic hernia without obstruction or gangrene: K44.9

## 2014-10-21 LAB — URINALYSIS, ROUTINE W REFLEX MICROSCOPIC
BILIRUBIN URINE: NEGATIVE
Glucose, UA: NEGATIVE mg/dL
Hgb urine dipstick: NEGATIVE
Ketones, ur: NEGATIVE mg/dL
Leukocytes, UA: NEGATIVE
Nitrite: NEGATIVE
PH: 6 (ref 5.0–8.0)
Protein, ur: NEGATIVE mg/dL
SPECIFIC GRAVITY, URINE: 1.027 (ref 1.005–1.030)
UROBILINOGEN UA: 0.2 mg/dL (ref 0.0–1.0)

## 2014-10-21 MED ORDER — METHOCARBAMOL 500 MG PO TABS
1000.0000 mg | ORAL_TABLET | Freq: Once | ORAL | Status: AC
  Administered 2014-10-21: 1000 mg via ORAL
  Filled 2014-10-21: qty 2

## 2014-10-21 MED ORDER — METHOCARBAMOL 500 MG PO TABS: 500.0000 mg | ORAL_TABLET | Freq: Two times a day (BID) | ORAL | Status: DC

## 2014-10-21 MED ORDER — PREDNISONE 20 MG PO TABS: ORAL_TABLET | ORAL | Status: DC

## 2014-10-21 MED ORDER — DICYCLOMINE HCL 10 MG/ML IM SOLN
20.0000 mg | Freq: Once | INTRAMUSCULAR | Status: AC
  Administered 2014-10-21: 20 mg via INTRAMUSCULAR
  Filled 2014-10-21: qty 2

## 2014-10-21 MED ORDER — GI COCKTAIL ~~LOC~~
30.0000 mL | Freq: Once | ORAL | Status: AC
  Administered 2014-10-21: 30 mL via ORAL
  Filled 2014-10-21: qty 30

## 2014-10-21 MED ORDER — KETOROLAC TROMETHAMINE 60 MG/2ML IM SOLN
60.0000 mg | Freq: Once | INTRAMUSCULAR | Status: AC
  Administered 2014-10-21: 60 mg via INTRAMUSCULAR
  Filled 2014-10-21: qty 2

## 2014-10-21 MED ORDER — HYDROCODONE-ACETAMINOPHEN 7.5-325 MG/15ML PO SOLN: 10.0000 mL | Freq: Four times a day (QID) | ORAL | Status: DC | PRN

## 2014-10-21 MED ORDER — OXYCODONE HCL 5 MG/5ML PO SOLN: 5.0000 mg | Freq: Four times a day (QID) | ORAL | Status: DC | PRN

## 2014-10-21 MED ORDER — DEXAMETHASONE SODIUM PHOSPHATE 10 MG/ML IJ SOLN
10.0000 mg | Freq: Once | INTRAMUSCULAR | Status: AC
  Administered 2014-10-21: 10 mg via INTRAMUSCULAR
  Filled 2014-10-21: qty 1

## 2014-10-21 NOTE — ED Notes (Signed)
Pt reports sharp pains in right flank and groin.  Reports it hurts to walk and sit.  Also reports one episode of vomiting.

## 2014-10-21 NOTE — ED Provider Notes (Signed)
CSN: [id number]     Arrival date & time 10/21/14  0002 History   First MD Initiated Contact with Patient 10/21/14 [phone]     Chief Complaint  Patient presents with  . Flank Pain     (Consider location/radiation/quality/duration/timing/severity/associated sxs/prior Treatment) Patient is a 28 y.o. male presenting with back pain. The history is provided by the patient.  Back Pain Location:  Sacro-iliac joint Quality:  Stabbing Pain severity:  Severe Pain is:  Same all the time Onset quality:  Sudden Duration:  2 hours Timing:  Constant Progression:  Unchanged Chronicity:  New Context: not MCA and not MVA   Relieved by:  Nothing Worsened by:  Nothing tried Ineffective treatments:  None tried Associated symptoms: no abdominal swelling, no bladder incontinence, no bowel incontinence, no chest pain, no dysuria, no fever, no headaches, no leg pain, no numbness, no paresthesias, no perianal numbness, no tingling, no weakness and no weight loss   Risk factors: obesity     Past Medical History  Diagnosis Date  . Tobacco dipper   . GERD (gastroesophageal reflux disease)   . ADHD (attention deficit hyperactivity disorder)   . Hx MRSA infection 8/08    right thigh  . S/P colonoscopy 11/10, 10/08    Dr [name]  . S/P endoscopy 10/08, 2/10, 5/10    Dr Raliegh Scarlet reflux esophagitis, small HH  . Occult GI bleeding 12/2008    Trivial upper GI bleed/uncontrolled GERD by upper endoscopy 12/2008, normal f/u endoscopy 03/2009 with SBCE at that time  . Bipolar 1 disorder   . Esophagitis     Distal esophageal erosions consistent with mild erosive  reflux esophagitis 12/2008 by EGD   . Thyroid function test abnormal     Noted in 2011 discharge  . Obesity   . Gastric ulcer   . Hypercholesteremia   . Hiatal hernia    Past Surgical History  Procedure Laterality Date  . Vasectomy    . Ankle surgery    . Small bowel capsule  04/11/2009     normal throughout  .  Esophagogastroduodenoscopy   04/07/2009    Normal esophagus, small hiatal hernia  . Esophagogastroduodenoscopy  01/09/2009    Distal esophageal erosions consistent with mild erosive reflux esophagitis, otherwise normal esophagus, small hiatal herniaotherwise normal stomach, D1-D2   . Ileocolonoscopy  08/26/2007     A normal rectum, colon, and terminal ileum  . Esophagogastroduodenoscopy  08/26/2007    Normal esophagus, a small hiatal/hernia, otherwise normal stomach D1 through D3  . Colonoscopy  10/10/2009    anal papilla otherwise normal   Family History  Problem Relation Age of Onset  . Leukemia Father 56  . Seizures Mother   . Bipolar disorder Brother   . ADD / ADHD Brother    History  Substance Use Topics  . Smoking status: Never Smoker   . Smokeless tobacco: Current User    Types: Chew     Comment: DIPx 10 years  . Alcohol Use: Yes     Comment: weekends    Review of Systems  Constitutional: Negative for fever and weight loss.  Cardiovascular: Negative for chest pain.  Gastrointestinal: Negative for bowel incontinence.  Genitourinary: Negative for bladder incontinence, dysuria, hematuria and decreased urine volume.  Musculoskeletal: Positive for back pain.  Neurological: Negative for tingling, weakness, numbness, headaches and paresthesias.  All other systems reviewed and are negative.     Allergies  Influenza vaccines; Tramadol; Ibuprofen; and Tylenol  Home Medications   Prior to  Admission medications   Medication Sig Start Date End Date Taking? Authorizing Provider  Azelastine-Fluticasone (DYMISTA) 137-50 MCG/ACT SUSP Place 2 sprays into both nostrils at bedtime. 05/12/14   Deneise Lever, MD  citalopram (CELEXA) 10 MG tablet Take 10 mg by mouth daily.    Historical Provider, MD  clotrimazole (LOTRIMIN) 1 % cream Apply to affected area 2 times daily 04/11/14   Arville Lime Schinlever, PA-C  diclofenac sodium (VOLTAREN) 1 % GEL Apply 2 g topically 4 (four) times  daily as needed (for pain in ankle). 08/19/13   Lysbeth Penner, FNP  famotidine (PEPCID) 20 MG tablet Take 1 tablet (20 mg total) by mouth 2 (two) times daily. 09/22/14   Jennifer L Piepenbrink, PA-C  hydrOXYzine (ATARAX/VISTARIL) 25 MG tablet Take 25 mg by mouth daily.    Historical Provider, MD  loperamide (IMODIUM) 2 MG capsule Take 1 capsule (2 mg total) by mouth 4 (four) times daily as needed for diarrhea or loose stools. 09/22/14   Jennifer L Piepenbrink, PA-C  ondansetron (ZOFRAN ODT) 4 MG disintegrating tablet Take 1 tablet (4 mg total) by mouth every 8 (eight) hours as needed for nausea. 09/22/14   Jennifer L Piepenbrink, PA-C  ondansetron (ZOFRAN) 4 MG tablet Take 1 tablet (4 mg total) by mouth every 6 (six) hours. 09/27/14   Ezequiel Essex, MD  OXcarbazepine (TRILEPTAL) 150 MG tablet Take 150 mg by mouth 2 (two) times daily.    Historical Provider, MD  oxyCODONE (ROXICODONE) 5 MG immediate release tablet Take 1 tablet (5 mg total) by mouth every 4 (four) hours as needed for severe pain. 09/27/14   Ezequiel Essex, MD  pantoprazole (PROTONIX) 40 MG tablet Take 1 tablet (40 mg total) by mouth daily. 03/31/14 07/15/15  Lysbeth Penner, FNP   BP 128/86 mmHg  Pulse 72  Temp(Src) 98.1 F (36.7 C) (Oral)  Resp 22  Ht 6' (1.829 m)  Wt 360 lb (163.295 kg)  BMI 48.81 kg/m2  SpO2 100% Physical Exam  Constitutional: He is oriented to person, place, and time. He appears well-developed and well-nourished. No distress.  HENT:  Head: Normocephalic and atraumatic.  Mouth/Throat: Oropharynx is clear and moist.  Eyes: Conjunctivae are normal. Pupils are equal, round, and reactive to light.  Neck: Normal range of motion. Neck supple.  Cardiovascular: Normal rate, regular rhythm and intact distal pulses.   Pulmonary/Chest: Effort normal and breath sounds normal. He has no wheezes. He has no rales.  Abdominal: Soft. Bowel sounds are increased. There is no tenderness. There is no rebound and no  guarding.  Musculoskeletal: Normal range of motion.  No point tenderness of the L spine,  Area of pain is below the level of the posterior iliac crest  Neurological: He is alert and oriented to person, place, and time.  Skin: Skin is warm and dry.  Psychiatric: He has a normal mood and affect.    ED Course  Procedures (including critical care time) Labs Review Labs Reviewed  URINALYSIS, ROUTINE W REFLEX MICROSCOPIC    Imaging Review No results found.   EKG Interpretation None      MDM   Final diagnoses:  None   Medications  ketorolac (TORADOL) injection 60 mg (60 mg Intramuscular Given 10/21/14 0229)  methocarbamol (ROBAXIN) tablet 1,000 mg (1,000 mg Oral Given 10/21/14 0233)  dicyclomine (BENTYL) injection 20 mg (20 mg Intramuscular Given 10/21/14 0229)  gi cocktail (Maalox,Lidocaine,Donnatal) (30 mLs Oral Given 10/21/14 0234)  dexamethasone (DECADRON) injection 10 mg (10 mg Intramuscular  Given 10/21/14 0230)     Patient checked in with family members, I suspect there is a narcotic seeking component.  He does not have a kidney stone.  Had a CT on 09/27/14 without stones in the kidneys and and there is no blood in the urine.  Patient denies emesis to EDP but reported to triage.  Follow up with your family doctor for ongoing care    Javelle Donigan K Yanina Knupp-Rasch, MD 10/21/14 [phone]

## 2014-10-21 NOTE — ED Notes (Signed)
MD at bedside. 

## 2015-01-02 ENCOUNTER — Emergency Department (HOSPITAL_BASED_OUTPATIENT_CLINIC_OR_DEPARTMENT_OTHER)
Admission: EM | Admit: 2015-01-02 | Discharge: 2015-01-02 | Disposition: A | Discharge status: Home / Self Care | Payer: Medicaid Other | Attending: Emergency Medicine | Admitting: Emergency Medicine

## 2015-01-02 ENCOUNTER — Encounter (HOSPITAL_BASED_OUTPATIENT_CLINIC_OR_DEPARTMENT_OTHER): Payer: Self-pay | Admitting: *Deleted

## 2015-01-02 DIAGNOSIS — Z791 Long term (current) use of non-steroidal anti-inflammatories (NSAID): Secondary | ICD-10-CM | POA: Insufficient documentation

## 2015-01-02 DIAGNOSIS — K219 Gastro-esophageal reflux disease without esophagitis: Secondary | ICD-10-CM | POA: Insufficient documentation

## 2015-01-02 DIAGNOSIS — M545 Low back pain, unspecified: Secondary | ICD-10-CM

## 2015-01-02 DIAGNOSIS — Z9889 Other specified postprocedural states: Secondary | ICD-10-CM | POA: Insufficient documentation

## 2015-01-02 DIAGNOSIS — Z7952 Long term (current) use of systemic steroids: Secondary | ICD-10-CM | POA: Insufficient documentation

## 2015-01-02 DIAGNOSIS — Z8614 Personal history of Methicillin resistant Staphylococcus aureus infection: Secondary | ICD-10-CM | POA: Insufficient documentation

## 2015-01-02 DIAGNOSIS — E669 Obesity, unspecified: Secondary | ICD-10-CM | POA: Insufficient documentation

## 2015-01-02 DIAGNOSIS — F319 Bipolar disorder, unspecified: Secondary | ICD-10-CM | POA: Insufficient documentation

## 2015-01-02 LAB — URINALYSIS, ROUTINE W REFLEX MICROSCOPIC
BILIRUBIN URINE: NEGATIVE
Glucose, UA: NEGATIVE mg/dL
HGB URINE DIPSTICK: NEGATIVE
KETONES UR: NEGATIVE mg/dL
LEUKOCYTES UA: NEGATIVE
Nitrite: NEGATIVE
PROTEIN: NEGATIVE mg/dL
Specific Gravity, Urine: 1.023 (ref 1.005–1.030)
Urobilinogen, UA: 0.2 mg/dL (ref 0.0–1.0)
pH: 5.5 (ref 5.0–8.0)

## 2015-01-02 MED ORDER — OXYCODONE HCL 5 MG PO TABS: 5.0000 mg | ORAL_TABLET | ORAL | Status: DC | PRN

## 2015-01-02 MED ORDER — HYDROMORPHONE HCL 1 MG/ML IJ SOLN
1.0000 mg | Freq: Once | INTRAMUSCULAR | Status: AC
  Administered 2015-01-02: 1 mg via INTRAMUSCULAR
  Filled 2015-01-02: qty 1

## 2015-01-02 MED ORDER — CYCLOBENZAPRINE HCL 10 MG PO TABS: 10.0000 mg | ORAL_TABLET | Freq: Two times a day (BID) | ORAL | Status: DC | PRN

## 2015-01-02 MED ORDER — HYDROCODONE-ACETAMINOPHEN 5-325 MG PO TABS: 2.0000 | ORAL_TABLET | ORAL | Status: DC | PRN

## 2015-01-02 NOTE — ED Notes (Signed)
Pt states he cannot take vicodin, as he is allergic to tylenol. Pt states he is allergic to nsaids, ultram and tylenol and can only take oxycodone with no tylenol for his pain. Dr. Sabra Heck informed.

## 2015-01-02 NOTE — ED Notes (Signed)
Pt to room 5, reports back pain last night, has not slept all night, pt states pain is to right flank area radiating to his right groin, had some dysuria last night also. Pt states he has had this same pain in the past, dx with "pulled muscle". Pt rates pain at 10/10, worse with movement, given urinal with instructions for cc urine sample.

## 2015-01-02 NOTE — ED Notes (Signed)
MD at bedside. 

## 2015-01-02 NOTE — ED Provider Notes (Signed)
CSN: [id number]     Arrival date & time 01/02/15  0826 History   First MD Initiated Contact with Patient 01/02/15 0830     Chief Complaint  Patient presents with  . Flank Pain     (Consider location/radiation/quality/duration/timing/severity/associated sxs/prior Treatment) HPI Comments: The patient is a 29 year old male, he is morbidly obese, he presents with a complaint of right flank pain which she states started last night. He states that it was not that bad last night and he was able to sleep until approximately 2:00 in the morning when his pain  became intensified, it is definitely worse with any kind of movement, he is unable to get out of bed easily because of the pain. He feels that is worse in his right lower back and radiates to his right groin, he did have associated dysuria this morning, he does have associated nausea, he does not have any vomiting fevers chills coughing chest pain leg swelling. He does have chronic diarrhea. He has seen in the emergency department several times in the past for either back pain or flank pain, it was thought to be muscle spasms at that time, CT scans reviewed from the past showing no findings of kidney stones or other intra-abdominal problems.   Patient is a 29 y.o. male presenting with flank pain. The history is provided by the patient.  Flank Pain    Past Medical History  Diagnosis Date  . Tobacco dipper   . GERD (gastroesophageal reflux disease)   . ADHD (attention deficit hyperactivity disorder)   . Hx MRSA infection 8/08    right thigh  . S/P colonoscopy 11/10, 10/08    Dr [name]  . S/P endoscopy 10/08, 2/10, 5/10    Dr Raliegh Scarlet reflux esophagitis, small HH  . Occult GI bleeding 12/2008    Trivial upper GI bleed/uncontrolled GERD by upper endoscopy 12/2008, normal f/u endoscopy 03/2009 with SBCE at that time  . Bipolar 1 disorder   . Esophagitis     Distal esophageal erosions consistent with mild erosive  reflux esophagitis  12/2008 by EGD   . Thyroid function test abnormal     Noted in 2011 discharge  . Obesity   . Gastric ulcer   . Hypercholesteremia   . Hiatal hernia    Past Surgical History  Procedure Laterality Date  . Vasectomy    . Ankle surgery    . Small bowel capsule  04/11/2009     normal throughout  . Esophagogastroduodenoscopy   04/07/2009    Normal esophagus, small hiatal hernia  . Esophagogastroduodenoscopy  01/09/2009    Distal esophageal erosions consistent with mild erosive reflux esophagitis, otherwise normal esophagus, small hiatal herniaotherwise normal stomach, D1-D2   . Ileocolonoscopy  08/26/2007     A normal rectum, colon, and terminal ileum  . Esophagogastroduodenoscopy  08/26/2007    Normal esophagus, a small hiatal/hernia, otherwise normal stomach D1 through D3  . Colonoscopy  10/10/2009    anal papilla otherwise normal   Family History  Problem Relation Age of Onset  . Leukemia Father 42  . Seizures Mother   . Bipolar disorder Brother   . ADD / ADHD Brother    History  Substance Use Topics  . Smoking status: Never Smoker   . Smokeless tobacco: Current User    Types: Chew     Comment: DIPx 10 years  . Alcohol Use: Yes     Comment: weekends    Review of Systems  Genitourinary: Positive for flank pain.  All other systems reviewed and are negative.     Allergies  Influenza vaccines; Tramadol; Ibuprofen; and Tylenol  Home Medications   Prior to Admission medications   Medication Sig Start Date End Date Taking? Authorizing Provider  Azelastine-Fluticasone (DYMISTA) 137-50 MCG/ACT SUSP Place 2 sprays into both nostrils at bedtime. 05/12/14   Deneise Lever, MD  citalopram (CELEXA) 10 MG tablet Take 10 mg by mouth daily.    Historical Provider, MD  clotrimazole (LOTRIMIN) 1 % cream Apply to affected area 2 times daily 04/11/14   Arville Lime Schinlever, PA-C  cyclobenzaprine (FLEXERIL) 10 MG tablet Take 1 tablet (10 mg total) by mouth 2 (two) times daily as  needed for muscle spasms. 01/02/15   Johnna Acosta, MD  diclofenac sodium (VOLTAREN) 1 % GEL Apply 2 g topically 4 (four) times daily as needed (for pain in ankle). 08/19/13   Lysbeth Penner, FNP  famotidine (PEPCID) 20 MG tablet Take 1 tablet (20 mg total) by mouth 2 (two) times daily. 09/22/14   Jennifer L Piepenbrink, PA-C  HYDROcodone-acetaminophen (NORCO/VICODIN) 5-325 MG per tablet Take 2 tablets by mouth every 4 (four) hours as needed. 01/02/15   Johnna Acosta, MD  hydrOXYzine (ATARAX/VISTARIL) 25 MG tablet Take 25 mg by mouth daily.    Historical Provider, MD  loperamide (IMODIUM) 2 MG capsule Take 1 capsule (2 mg total) by mouth 4 (four) times daily as needed for diarrhea or loose stools. 09/22/14   Jennifer L Piepenbrink, PA-C  methocarbamol (ROBAXIN) 500 MG tablet Take 1 tablet (500 mg total) by mouth 2 (two) times daily. 10/21/14   April K Palumbo-Rasch, MD  ondansetron (ZOFRAN ODT) 4 MG disintegrating tablet Take 1 tablet (4 mg total) by mouth every 8 (eight) hours as needed for nausea. 09/22/14   Jennifer L Piepenbrink, PA-C  ondansetron (ZOFRAN) 4 MG tablet Take 1 tablet (4 mg total) by mouth every 6 (six) hours. 09/27/14   Ezequiel Essex, MD  OXcarbazepine (TRILEPTAL) 150 MG tablet Take 150 mg by mouth 2 (two) times daily.    Historical Provider, MD  oxyCODONE (ROXICODONE) 5 MG immediate release tablet Take 1 tablet (5 mg total) by mouth every 4 (four) hours as needed for severe pain. 09/27/14   Ezequiel Essex, MD  oxyCODONE (ROXICODONE) 5 MG/5ML solution Take 5 mLs (5 mg total) by mouth every 6 (six) hours as needed for severe pain. 10/21/14   April K Palumbo-Rasch, MD  pantoprazole (PROTONIX) 40 MG tablet Take 1 tablet (40 mg total) by mouth daily. 03/31/14 07/15/15  Lysbeth Penner, FNP  predniSONE (DELTASONE) 20 MG tablet 3 tabs po day one, then 2 po daily x 4 days 10/21/14   April K Palumbo-Rasch, MD   BP 156/89 mmHg  Pulse 73  Temp(Src) 98.1 F (36.7 C) (Oral)  Resp 20  Ht 6'  (1.829 m)  Wt 370 lb (167.831 kg)  BMI 50.17 kg/m2  SpO2 100% Physical Exam  Constitutional: He appears well-developed and well-nourished. No distress.  HENT:  Head: Normocephalic and atraumatic.  Mouth/Throat: Oropharynx is clear and moist. No oropharyngeal exudate.  Eyes: Conjunctivae and EOM are normal. Pupils are equal, round, and reactive to light. Right eye exhibits no discharge. Left eye exhibits no discharge. No scleral icterus.  Neck: Normal range of motion. Neck supple. No JVD present. No thyromegaly present.  Cardiovascular: Normal rate, regular rhythm, normal heart sounds and intact distal pulses.  Exam reveals no gallop and no friction rub.   No murmur heard.  Pulmonary/Chest: Effort normal and breath sounds normal. No respiratory distress. He has no wheezes. He has no rales.  Abdominal: Soft. Bowel sounds are normal. He exhibits no distension and no mass. There is tenderness ( Tender to palpation in the right groin as well as the right lower abdomen and the right flank. No peritoneal signs, no guarding, right CVA tenderness present).  Obese abdomen, no tenderness on the left side, left lower, left upper, epigastrium or right upper quadrant.  Genitourinary:  No tenderness over the scrotum and testicles and no signs of intra scrotal hernia  Musculoskeletal: Normal range of motion. He exhibits no edema or tenderness.  Lymphadenopathy:    He has no cervical adenopathy.  Neurological: He is alert. Coordination normal.  Skin: Skin is warm and dry. No rash noted. No erythema.  Psychiatric: He has a normal mood and affect. His behavior is normal.  Nursing note and vitals reviewed.   ED Course  Procedures (including critical care time) Labs Review Labs Reviewed  URINE CULTURE  URINALYSIS, ROUTINE W REFLEX MICROSCOPIC    Imaging Review No results found.    MDM   Final diagnoses:  Right-sided low back pain without sciatica    The patient's exam is limited by his morbid  obesity, he has tenderness all across his right flank and side which would make hernia much less likely, would also make appendicitis much less likely given that his tenderness extends to his right back. With a complaint of dysuria we'll check urinalysis, pain medications ordered, vital signs show mild hypertension but no fever or tachycardia.  UA neg, pt much improved after meds, pt amenable to d/c - will return in 2 days if no better or if pain worsens, pt warned that appendicitis is possiblility though less likely - he agrees and will return for worsening pain.  Meds given in ED:  Medications  HYDROmorphone (DILAUDID) injection 1 mg (1 mg Intramuscular Given 01/02/15 0849)    New Prescriptions   CYCLOBENZAPRINE (FLEXERIL) 10 MG TABLET    Take 1 tablet (10 mg total) by mouth 2 (two) times daily as needed for muscle spasms.   HYDROCODONE-ACETAMINOPHEN (NORCO/VICODIN) 5-325 MG PER TABLET    Take 2 tablets by mouth every 4 (four) hours as needed.      Johnna Acosta, MD 01/02/15 [phone]

## 2015-01-02 NOTE — Discharge Instructions (Signed)

## 2015-01-03 ENCOUNTER — Emergency Department (HOSPITAL_COMMUNITY): Payer: Medicaid Other

## 2015-01-03 ENCOUNTER — Emergency Department (HOSPITAL_COMMUNITY)
Admission: EM | Admit: 2015-01-03 | Discharge: 2015-01-03 | Disposition: A | Discharge status: Home / Self Care | Payer: Self-pay | Attending: Emergency Medicine | Admitting: Emergency Medicine

## 2015-01-03 ENCOUNTER — Encounter (HOSPITAL_COMMUNITY): Payer: Self-pay | Admitting: Emergency Medicine

## 2015-01-03 DIAGNOSIS — M549 Dorsalgia, unspecified: Secondary | ICD-10-CM

## 2015-01-03 DIAGNOSIS — Z7952 Long term (current) use of systemic steroids: Secondary | ICD-10-CM | POA: Insufficient documentation

## 2015-01-03 DIAGNOSIS — Z79899 Other long term (current) drug therapy: Secondary | ICD-10-CM | POA: Insufficient documentation

## 2015-01-03 DIAGNOSIS — M545 Low back pain: Secondary | ICD-10-CM | POA: Insufficient documentation

## 2015-01-03 DIAGNOSIS — K219 Gastro-esophageal reflux disease without esophagitis: Secondary | ICD-10-CM | POA: Insufficient documentation

## 2015-01-03 DIAGNOSIS — E669 Obesity, unspecified: Secondary | ICD-10-CM | POA: Insufficient documentation

## 2015-01-03 DIAGNOSIS — E780 Pure hypercholesterolemia: Secondary | ICD-10-CM | POA: Insufficient documentation

## 2015-01-03 DIAGNOSIS — Z8614 Personal history of Methicillin resistant Staphylococcus aureus infection: Secondary | ICD-10-CM | POA: Insufficient documentation

## 2015-01-03 DIAGNOSIS — Z9889 Other specified postprocedural states: Secondary | ICD-10-CM | POA: Insufficient documentation

## 2015-01-03 DIAGNOSIS — F319 Bipolar disorder, unspecified: Secondary | ICD-10-CM | POA: Insufficient documentation

## 2015-01-03 DIAGNOSIS — K259 Gastric ulcer, unspecified as acute or chronic, without hemorrhage or perforation: Secondary | ICD-10-CM | POA: Insufficient documentation

## 2015-01-03 LAB — COMPREHENSIVE METABOLIC PANEL
ALBUMIN: 3.8 g/dL (ref 3.5–5.2)
ALT: 31 U/L (ref 0–53)
ANION GAP: 5 (ref 5–15)
AST: 26 U/L (ref 0–37)
Alkaline Phosphatase: 57 U/L (ref 39–117)
BUN: 11 mg/dL (ref 6–23)
CO2: 31 mmol/L (ref 19–32)
CREATININE: 1.04 mg/dL (ref 0.50–1.35)
Calcium: 9.5 mg/dL (ref 8.4–10.5)
Chloride: 104 mmol/L (ref 96–112)
GFR calc Af Amer: >90 mL/min (ref >90)
GFR calc non Af Amer: >90 mL/min (ref >90)
GLUCOSE: 90 mg/dL (ref 70–99)
Potassium: 4.2 mmol/L (ref 3.5–5.1)
Sodium: 140 mmol/L (ref 135–145)
TOTAL PROTEIN: 6.6 g/dL (ref 6.0–8.3)
Total Bilirubin: 1 mg/dL (ref 0.3–1.2)

## 2015-01-03 LAB — CBC WITH DIFFERENTIAL/PLATELET
Basophils Absolute: 0 10*3/uL (ref 0.0–0.1)
Basophils Relative: 0 % (ref 0–1)
Eosinophils Absolute: 0.2 10*3/uL (ref 0.0–0.7)
Eosinophils Relative: 2 % (ref 0–5)
HEMATOCRIT: 45.3 % (ref 39.0–52.0)
HEMOGLOBIN: 15 g/dL (ref 13.0–17.0)
Lymphocytes Relative: 29 % (ref 12–46)
Lymphs Abs: 2.3 10*3/uL (ref 0.7–4.0)
MCH: 28.7 pg (ref 26.0–34.0)
MCHC: 33.1 g/dL (ref 30.0–36.0)
MCV: 86.6 fL (ref 78.0–100.0)
MONOS PCT: 6 % (ref 3–12)
Monocytes Absolute: 0.5 10*3/uL (ref 0.1–1.0)
NEUTROS ABS: 4.9 10*3/uL (ref 1.7–7.7)
NEUTROS PCT: 63 % (ref 43–77)
Platelets: 261 10*3/uL (ref 150–400)
RBC: 5.23 MIL/uL (ref 4.22–5.81)
RDW: 13.9 % (ref 11.5–15.5)
WBC: 7.8 10*3/uL (ref 4.0–10.5)

## 2015-01-03 LAB — URINALYSIS, ROUTINE W REFLEX MICROSCOPIC
BILIRUBIN URINE: NEGATIVE
GLUCOSE, UA: NEGATIVE mg/dL
Hgb urine dipstick: NEGATIVE
Ketones, ur: NEGATIVE mg/dL
Leukocytes, UA: NEGATIVE
Nitrite: NEGATIVE
Protein, ur: NEGATIVE mg/dL
SPECIFIC GRAVITY, URINE: 1.021 (ref 1.005–1.030)
Urobilinogen, UA: 0.2 mg/dL (ref 0.0–1.0)
pH: 5 (ref 5.0–8.0)

## 2015-01-03 LAB — URINE CULTURE
Colony Count: NO GROWTH
Culture: NO GROWTH
SPECIAL REQUESTS: NORMAL

## 2015-01-03 LAB — LIPASE, BLOOD: LIPASE: 33 U/L (ref 11–59)

## 2015-01-03 MED ORDER — KETOROLAC TROMETHAMINE 30 MG/ML IJ SOLN
30.0000 mg | Freq: Once | INTRAMUSCULAR | Status: AC
  Administered 2015-01-03: 30 mg via INTRAVENOUS
  Filled 2015-01-03: qty 1

## 2015-01-03 MED ORDER — HYDROMORPHONE HCL 1 MG/ML IJ SOLN
1.0000 mg | Freq: Once | INTRAMUSCULAR | Status: AC
  Administered 2015-01-03: 1 mg via INTRAVENOUS
  Filled 2015-01-03: qty 1

## 2015-01-03 MED ORDER — DIAZEPAM 5 MG/ML IJ SOLN
5.0000 mg | Freq: Once | INTRAMUSCULAR | Status: AC
  Administered 2015-01-03: 5 mg via INTRAVENOUS
  Filled 2015-01-03: qty 2

## 2015-01-03 MED ORDER — DIAZEPAM 5 MG PO TABS: 5.0000 mg | ORAL_TABLET | Freq: Two times a day (BID) | ORAL | Status: DC

## 2015-01-03 NOTE — ED Notes (Signed)
PA at bedside.

## 2015-01-03 NOTE — ED Notes (Addendum)
Pt called [facility]AR to lie flat on ride; was seen yesterday for right flank pain that radiates into groin. States pain still there. No loss of bowel or urine control.

## 2015-01-03 NOTE — ED Provider Notes (Signed)
CSN: [id number]     Arrival date & time 01/03/15  0847 History   First MD Initiated Contact with Patient 01/03/15 [phone]     Chief Complaint  Patient presents with  . Flank Pain     (Consider location/radiation/quality/duration/timing/severity/associated sxs/prior Treatment) HPI Comments: Patient with no past surgical history or significant medical history -- presents with complaint of back and abdominal pain starting yesterday morning. It began as a mild pain in his right back. During the day the pain progressed to the right lower abdomen including into the groin and today all the way across his lower back. Pain in his right lower back is sharp and stabbing. Patient has had subjective fever and chills but no documented fever. No URI symptoms, chest pain or shortness of breath. No nausea, vomiting, or diarrhea. No constipation. Patient denies dysuria and hematuria. No history of kidney stone. Patient took the [place] that he was prescribed yesterday however this did not improve his pain. Onset was acute. Course is progressing. Certain positions make the symptoms better. Movement, palpation, and certain positions make the pain worse. Patient denies warning symptoms of back pain including: fecal incontinence, urinary retention or overflow incontinence, night sweats, waking from sleep with back pain, unexplained fevers or weight loss, h/o cancer, IVDU, recent trauma.     Patient is a 29 y.o. male presenting with flank pain. The history is provided by the patient.  Flank Pain Associated symptoms include abdominal pain. Pertinent negatives include no chest pain, coughing, fever, headaches, myalgias, nausea, numbness, rash, sore throat, vomiting or weakness.    Past Medical History  Diagnosis Date  . Tobacco dipper   . GERD (gastroesophageal reflux disease)   . ADHD (attention deficit hyperactivity disorder)   . Hx MRSA infection 8/08    right thigh  . S/P colonoscopy 11/10, 10/08   Dr [name]  . S/P endoscopy 10/08, 2/10, 5/10    Dr Raliegh Scarlet reflux esophagitis, small HH  . Occult GI bleeding 12/2008    Trivial upper GI bleed/uncontrolled GERD by upper endoscopy 12/2008, normal f/u endoscopy 03/2009 with SBCE at that time  . Bipolar 1 disorder   . Esophagitis     Distal esophageal erosions consistent with mild erosive  reflux esophagitis 12/2008 by EGD   . Thyroid function test abnormal     Noted in 2011 discharge  . Obesity   . Gastric ulcer   . Hypercholesteremia   . Hiatal hernia    Past Surgical History  Procedure Laterality Date  . Vasectomy    . Ankle surgery    . Small bowel capsule  04/11/2009     normal throughout  . Esophagogastroduodenoscopy   04/07/2009    Normal esophagus, small hiatal hernia  . Esophagogastroduodenoscopy  01/09/2009    Distal esophageal erosions consistent with mild erosive reflux esophagitis, otherwise normal esophagus, small hiatal herniaotherwise normal stomach, D1-D2   . Ileocolonoscopy  08/26/2007     A normal rectum, colon, and terminal ileum  . Esophagogastroduodenoscopy  08/26/2007    Normal esophagus, a small hiatal/hernia, otherwise normal stomach D1 through D3  . Colonoscopy  10/10/2009    anal papilla otherwise normal   Family History  Problem Relation Age of Onset  . Leukemia Father 9  . Seizures Mother   . Bipolar disorder Brother   . ADD / ADHD Brother    History  Substance Use Topics  . Smoking status: Never Smoker   . Smokeless tobacco: Current User    Types:  Chew     Comment: DIPx 10 years  . Alcohol Use: Yes     Comment: weekends    Review of Systems  Constitutional: Negative for fever and unexpected weight change.  HENT: Negative for rhinorrhea and sore throat.   Eyes: Negative for redness.  Respiratory: Negative for cough.   Cardiovascular: Negative for chest pain.  Gastrointestinal: Positive for abdominal pain. Negative for nausea, vomiting, diarrhea and constipation.       Neg  for fecal incontinence  Genitourinary: Positive for flank pain. Negative for dysuria, hematuria, discharge, difficulty urinating and penile pain.       Negative for urinary incontinence or retention  Musculoskeletal: Positive for back pain. Negative for myalgias.  Skin: Negative for rash.  Neurological: Negative for weakness, numbness and headaches.       Negative for saddle paresthesias       Allergies  Influenza vaccines; Tramadol; Ibuprofen; and Tylenol  Home Medications   Prior to Admission medications   Medication Sig Start Date End Date Taking? Authorizing Provider  Azelastine-Fluticasone (DYMISTA) 137-50 MCG/ACT SUSP Place 2 sprays into both nostrils at bedtime. 05/12/14   Deneise Lever, MD  citalopram (CELEXA) 10 MG tablet Take 10 mg by mouth daily.    Historical Provider, MD  clotrimazole (LOTRIMIN) 1 % cream Apply to affected area 2 times daily 04/11/14   Arville Lime Schinlever, PA-C  cyclobenzaprine (FLEXERIL) 10 MG tablet Take 1 tablet (10 mg total) by mouth 2 (two) times daily as needed for muscle spasms. 01/02/15   Johnna Acosta, MD  diclofenac sodium (VOLTAREN) 1 % GEL Apply 2 g topically 4 (four) times daily as needed (for pain in ankle). 08/19/13   Lysbeth Penner, FNP  famotidine (PEPCID) 20 MG tablet Take 1 tablet (20 mg total) by mouth 2 (two) times daily. 09/22/14   Jennifer L Piepenbrink, PA-C  hydrOXYzine (ATARAX/VISTARIL) 25 MG tablet Take 25 mg by mouth daily.    Historical Provider, MD  loperamide (IMODIUM) 2 MG capsule Take 1 capsule (2 mg total) by mouth 4 (four) times daily as needed for diarrhea or loose stools. 09/22/14   Jennifer L Piepenbrink, PA-C  methocarbamol (ROBAXIN) 500 MG tablet Take 1 tablet (500 mg total) by mouth 2 (two) times daily. 10/21/14   April K Palumbo-Rasch, MD  ondansetron (ZOFRAN ODT) 4 MG disintegrating tablet Take 1 tablet (4 mg total) by mouth every 8 (eight) hours as needed for nausea. 09/22/14   Jennifer L Piepenbrink, PA-C    ondansetron (ZOFRAN) 4 MG tablet Take 1 tablet (4 mg total) by mouth every 6 (six) hours. 09/27/14   Ezequiel Essex, MD  OXcarbazepine (TRILEPTAL) 150 MG tablet Take 150 mg by mouth 2 (two) times daily.    Historical Provider, MD  oxyCODONE (ROXICODONE) 5 MG immediate release tablet Take 1 tablet (5 mg total) by mouth every 4 (four) hours as needed for severe pain. 01/02/15   Johnna Acosta, MD  pantoprazole (PROTONIX) 40 MG tablet Take 1 tablet (40 mg total) by mouth daily. 03/31/14 07/15/15  Lysbeth Penner, FNP  predniSONE (DELTASONE) 20 MG tablet 3 tabs po day one, then 2 po daily x 4 days 10/21/14   April K Palumbo-Rasch, MD   BP 114/64 mmHg  Pulse 67  Temp(Src) 97.4 F (36.3 C) (Oral)  Resp 20  SpO2 99%   Physical Exam  Constitutional: He appears well-developed and well-nourished.  HENT:  Head: Normocephalic and atraumatic.  Eyes: Conjunctivae are normal. Right eye exhibits  no discharge. Left eye exhibits no discharge.  Neck: Normal range of motion. Neck supple.  Cardiovascular: Normal rate, regular rhythm and normal heart sounds.   Pulmonary/Chest: Effort normal and breath sounds normal. No respiratory distress. He has no wheezes. He has no rales.  Abdominal: Soft. Bowel sounds are normal. There is tenderness (R lower quadrant). There is no rebound, no guarding and no CVA tenderness. Hernia confirmed negative in the right inguinal area and confirmed negative in the left inguinal area.  Genitourinary: Penis normal. Right testis shows no mass, no swelling and no tenderness. Left testis shows no mass, no swelling and no tenderness. No discharge found.  Musculoskeletal: Normal range of motion. He exhibits tenderness. He exhibits no edema.       Right hip: Normal.       Left hip: Normal.       Cervical back: He exhibits normal range of motion, no tenderness and no bony tenderness.       Thoracic back: He exhibits normal range of motion, no tenderness and no bony tenderness.       Lumbar  back: He exhibits bony tenderness. He exhibits normal range of motion.       Back:  No step-off noted with palpation of spine.   Neurological: He is alert. He has normal reflexes. No sensory deficit. He exhibits normal muscle tone.  5/5 strength in entire lower extremities bilaterally. No sensation deficit.   Skin: Skin is warm and dry.  Psychiatric: He has a normal mood and affect.  Nursing note and vitals reviewed.   ED Course  Procedures (including critical care time) Labs Review Labs Reviewed  CBC WITH DIFFERENTIAL/PLATELET  COMPREHENSIVE METABOLIC PANEL  LIPASE, BLOOD  URINALYSIS, ROUTINE W REFLEX MICROSCOPIC    Imaging Review Ct Renal Stone Study  01/03/2015   CLINICAL DATA:  Right flank pain, back pain.  EXAM: CT ABDOMEN AND PELVIS WITHOUT CONTRAST  TECHNIQUE: Multidetector CT imaging of the abdomen and pelvis was performed following the standard protocol without IV contrast.  COMPARISON:  06/27/2014  FINDINGS: Lung bases are clear.  No effusions.  Heart is normal size.  Liver, gallbladder, spleen, pancreas, adrenals right kidney have an unremarkable unenhanced appearance. No renal or ureteral stones. Low-density lesion again noted in the lower pole of the left kidney measuring 3 cm, likely cyst. No hydronephrosis. Urinary bladder is unremarkable.  Stomach, large and small bowel unremarkable. Appendix is visualized and is normal. No free fluid, free air or adenopathy. Aorta is normal caliber.  No acute bony abnormality or focal bone lesion.  IMPRESSION: No renal or ureteral stones.  No hydronephrosis.  Normal appendix.  No acute findings.   Electronically Signed   By: Rolm Baptise M.D.   On: 01/03/2015 11:38     EKG Interpretation None       9:24 AM Patient seen and examined. Work-up initiated. Medications ordered.   Vital signs reviewed and are as follows: BP 114/64 mmHg  Pulse 67  Temp(Src) 97.4 F (36.3 C) (Oral)  Resp 20  SpO2 99%   1:16 PM Pain controlled with  parenteral medications.  Patient informed of CT scan results. Will switch muscle relaxer from Flexeril to Valium. Discussed that patient needs to take these as prescribed and to not mix with alcohol. Discussed need for PCP follow-up.  No red flag s/s of low back pain. Patient was counseled on back pain precautions and told to do activity as tolerated but do not lift, push, or pull heavy objects  more than 10 pounds for the next week.  Patient counseled to use ice or heat on back for no longer than 15 minutes every hour.   Patient prescribed muscle relaxer and counseled on proper use of muscle relaxant medication.    Patient prescribed narcotic pain medicine and counseled on proper use of narcotic pain medications. Counseled not to combine this medication with others containing tylenol.   Urged patient not to drink alcohol, drive, or perform any other activities that requires focus while taking either of these medications.  Patient urged to follow-up with PCP if pain does not improve with treatment and rest or if pain becomes recurrent. Urged to return with worsening severe pain, loss of bowel or bladder control, trouble walking.   The patient verbalizes understanding and agrees with the plan.     MDM   Final diagnoses:  Back pain   Patient with back pain, this is his 2nd visit in 2 days. Pt also has RLQ pain. CT ordered to r/o stone and appendicitis -- CT is negative. No neurological deficits. Patient is ambulatory. No warning symptoms of back pain including: fecal incontinence, urinary retention or overflow incontinence, night sweats, waking from sleep with back pain, unexplained fevers or weight loss, h/o cancer, IVDU, recent trauma. No concern for cauda equina, epidural abscess, or other serious cause of back pain. Conservative measures such as rest, ice/heat and pain medicine indicated with PCP follow-up if no improvement with conservative management.      Carlisle Cater,  PA-C 01/03/15 [place], MD 01/10/15 [phone]

## 2015-01-03 NOTE — ED Provider Notes (Signed)
Patient seen and evaluated. Care discussed with Alecia Lemming PA.  She describes a previous history of right flank musculoskeletal pain. Current current episode started yesterday morning early. Took Flexeril without relief. Seen and evaluated in the emergency room. Discharge home with appropriate discharge instructions to return if any anterior abdominal discomfort. He feels like he is worse in his now calm painful his right lower quadrant and presents here.  Exam shows him laying on his right side. He is uncomfortable with around. He does not have definite peritoneal irritation. However, this is a large adult male weighing over 350 pounds. Feel this does lessen the sensitivity had abdominal exam to soft peritoneal findings. I agree with imaging.  Tanna Furry, MD 01/03/15 [phone]

## 2015-01-03 NOTE — ED Notes (Signed)
Pt gone for CT 

## 2015-01-03 NOTE — ED Notes (Signed)
Patient was given a coke and peanut crackers.

## 2015-01-03 NOTE — Discharge Instructions (Signed)
Please read and follow all provided instructions.  Your diagnoses today include:  1. Back pain     Tests performed today include:  Vital signs - see below for your results today  Medications prescribed:   Valium - muscle relaxer medication  DO NOT drive or perform any activities that require you to be awake and alert because this medicine can make you drowsy.   Take any prescribed medications only as directed.  Home care instructions:   Follow any educational materials contained in this packet  Please rest, use ice or heat on your back for the next several days  Do not lift, push, pull anything more than 10 pounds for the next week  Follow-up instructions: Please follow-up with your primary care provider in the next 1 week for further evaluation of your symptoms.   Return instructions:  SEEK IMMEDIATE MEDICAL ATTENTION IF YOU HAVE:  New numbness, tingling, weakness, or problem with the use of your arms or legs  Severe back pain not relieved with medications  Loss control of your bowels or bladder  Increasing pain in any areas of the body (such as chest or abdominal pain)  Shortness of breath, dizziness, or fainting.   Worsening nausea (feeling sick to your stomach), vomiting, fever, or sweats  Any other emergent concerns regarding your health   Additional Information:  Your vital signs today were: BP 121/76 mmHg   Pulse 67   Temp(Src) 97.4 F (36.3 C) (Oral)   Resp 20   SpO2 95% If your blood pressure (BP) was elevated above 135/85 this visit, please have this repeated by your doctor within one month. --------------

## 2015-01-05 ENCOUNTER — Encounter (HOSPITAL_COMMUNITY): Payer: Self-pay

## 2015-01-05 ENCOUNTER — Emergency Department (HOSPITAL_COMMUNITY)
Admission: EM | Admit: 2015-01-05 | Discharge: 2015-01-05 | Disposition: A | Discharge status: Home / Self Care | Payer: Medicaid Other | Attending: Emergency Medicine | Admitting: Emergency Medicine

## 2015-01-05 DIAGNOSIS — M62830 Muscle spasm of back: Secondary | ICD-10-CM | POA: Insufficient documentation

## 2015-01-05 DIAGNOSIS — Z8614 Personal history of Methicillin resistant Staphylococcus aureus infection: Secondary | ICD-10-CM | POA: Insufficient documentation

## 2015-01-05 DIAGNOSIS — Z791 Long term (current) use of non-steroidal anti-inflammatories (NSAID): Secondary | ICD-10-CM | POA: Insufficient documentation

## 2015-01-05 DIAGNOSIS — Z79899 Other long term (current) drug therapy: Secondary | ICD-10-CM | POA: Insufficient documentation

## 2015-01-05 DIAGNOSIS — M545 Low back pain, unspecified: Secondary | ICD-10-CM

## 2015-01-05 DIAGNOSIS — Z7952 Long term (current) use of systemic steroids: Secondary | ICD-10-CM | POA: Insufficient documentation

## 2015-01-05 DIAGNOSIS — Z8659 Personal history of other mental and behavioral disorders: Secondary | ICD-10-CM | POA: Insufficient documentation

## 2015-01-05 DIAGNOSIS — E669 Obesity, unspecified: Secondary | ICD-10-CM | POA: Insufficient documentation

## 2015-01-05 DIAGNOSIS — K219 Gastro-esophageal reflux disease without esophagitis: Secondary | ICD-10-CM | POA: Insufficient documentation

## 2015-01-05 LAB — BASIC METABOLIC PANEL
Anion gap: 7 (ref 5–15)
BUN: 12 mg/dL (ref 6–23)
CO2: 29 mmol/L (ref 19–32)
Calcium: 9.4 mg/dL (ref 8.4–10.5)
Chloride: 104 mmol/L (ref 96–112)
Creatinine, Ser: 0.96 mg/dL (ref 0.50–1.35)
GFR calc Af Amer: >90 mL/min (ref >90)
GLUCOSE: 108 mg/dL — AB (ref 70–99)
POTASSIUM: 4.6 mmol/L (ref 3.5–5.1)
Sodium: 140 mmol/L (ref 135–145)

## 2015-01-05 LAB — CBC WITH DIFFERENTIAL/PLATELET
Basophils Absolute: 0 10*3/uL (ref 0.0–0.1)
Basophils Relative: 0 % (ref 0–1)
EOS PCT: 1 % (ref 0–5)
Eosinophils Absolute: 0.2 10*3/uL (ref 0.0–0.7)
HEMATOCRIT: 45.3 % (ref 39.0–52.0)
Hemoglobin: 15.3 g/dL (ref 13.0–17.0)
LYMPHS ABS: 2.4 10*3/uL (ref 0.7–4.0)
LYMPHS PCT: 17 % (ref 12–46)
MCH: 28.6 pg (ref 26.0–34.0)
MCHC: 33.8 g/dL (ref 30.0–36.0)
MCV: 84.7 fL (ref 78.0–100.0)
MONO ABS: 0.7 10*3/uL (ref 0.1–1.0)
Monocytes Relative: 5 % (ref 3–12)
Neutro Abs: 10.7 10*3/uL — ABNORMAL HIGH (ref 1.7–7.7)
Neutrophils Relative %: 77 % (ref 43–77)
Platelets: 274 10*3/uL (ref 150–400)
RBC: 5.35 MIL/uL (ref 4.22–5.81)
RDW: 13.7 % (ref 11.5–15.5)
WBC: 14 10*3/uL — AB (ref 4.0–10.5)

## 2015-01-05 MED ORDER — HYDROMORPHONE HCL 1 MG/ML IJ SOLN
1.0000 mg | Freq: Once | INTRAMUSCULAR | Status: AC
  Administered 2015-01-05: 1 mg via INTRAVENOUS
  Filled 2015-01-05: qty 1

## 2015-01-05 MED ORDER — DIAZEPAM 10 MG PO TABS: 10.0000 mg | ORAL_TABLET | Freq: Three times a day (TID) | ORAL | Status: DC | PRN

## 2015-01-05 MED ORDER — METHOCARBAMOL 1000 MG/10ML IJ SOLN
1000.0000 mg | Freq: Once | INTRAMUSCULAR | Status: AC
  Administered 2015-01-05: 1000 mg via INTRAVENOUS
  Filled 2015-01-05: qty 10

## 2015-01-05 MED ORDER — ONDANSETRON HCL 4 MG/2ML IJ SOLN
4.0000 mg | Freq: Once | INTRAMUSCULAR | Status: AC
  Administered 2015-01-05: 4 mg via INTRAVENOUS
  Filled 2015-01-05: qty 2

## 2015-01-05 MED ORDER — METHOCARBAMOL 1000 MG/10ML IJ SOLN: 1000.0000 mg | Freq: Once | INTRAMUSCULAR | Status: DC

## 2015-01-05 MED ORDER — OXYCODONE HCL 10 MG PO TABS: 15.0000 mg | ORAL_TABLET | ORAL | Status: DC | PRN

## 2015-01-05 NOTE — ED Notes (Signed)
Pt A&OX4, ambulatory at d/c with steady gait, but wheeled out of ED in wheelchair, NAD. Pt states his wife will be driving him home. No further questions or concerns expressed.

## 2015-01-05 NOTE — ED Notes (Addendum)
Per ems: pt from home, reports lower right lumbar back pain since Sunday. Was seen at urgent care and given a Dilaudid injection but it did not relieve his symptoms. He was seen here on Tuesday for same symptoms and now he states the pain is progressively getting worse.

## 2015-01-05 NOTE — ED Provider Notes (Signed)
CSN: [id number]     Arrival date & time 01/05/15  0110 History  This chart was scribed for Phillip Fuel, MD by Einar Pheasant, ED Scribe. This patient was seen in room A04C/A04C and the patient's care was started at 2:27 AM.    Chief Complaint  Patient presents with  . Back Pain   The history is provided by the patient and medical records. No language interpreter was used.   HPI Comments: [name] is a 29 y.o. male with PMhx of obesity and stomach ulcers presents to the Emergency Department complaining of gradual onset persistent right lower back pain that started 4 days ago. Pt states that he was seen for similar thing at a local UC at which he was given a shot of Dilaudid and d/c home with flexeril. However, he states that the injection did not provide him with adequate relief. Two day he was seen here in the ED and d/c home with valium. Says that none of the medication he was sent home with is alleviating his pain. He currently rates his back pain as a "10/10" and is exacerbated by movement. He states that the pain is progressively getting worse. Denies any recent injuries or falls. Pt denies any fever, neck pain, sore throat, visual disturbance, CP, cough, SOB, abdominal pain, nausea, emesis, diarrhea, urinary symptoms, HA, weakness, numbness and rash as associated symptoms.    Past Medical History  Diagnosis Date  . Tobacco dipper   . GERD (gastroesophageal reflux disease)   . ADHD (attention deficit hyperactivity disorder)   . Hx MRSA infection 8/08    right thigh  . S/P colonoscopy 11/10, 10/08    Dr [name]  . S/P endoscopy 10/08, 2/10, 5/10    Dr Raliegh Scarlet reflux esophagitis, small HH  . Occult GI bleeding 12/2008    Trivial upper GI bleed/uncontrolled GERD by upper endoscopy 12/2008, normal f/u endoscopy 03/2009 with SBCE at that time  . Bipolar 1 disorder   . Esophagitis     Distal esophageal erosions consistent with mild erosive  reflux esophagitis 12/2008 by  EGD   . Thyroid function test abnormal     Noted in 2011 discharge  . Obesity   . Gastric ulcer   . Hypercholesteremia   . Hiatal hernia    Past Surgical History  Procedure Laterality Date  . Vasectomy    . Ankle surgery    . Small bowel capsule  04/11/2009     normal throughout  . Esophagogastroduodenoscopy   04/07/2009    Normal esophagus, small hiatal hernia  . Esophagogastroduodenoscopy  01/09/2009    Distal esophageal erosions consistent with mild erosive reflux esophagitis, otherwise normal esophagus, small hiatal herniaotherwise normal stomach, D1-D2   . Ileocolonoscopy  08/26/2007     A normal rectum, colon, and terminal ileum  . Esophagogastroduodenoscopy  08/26/2007    Normal esophagus, a small hiatal/hernia, otherwise normal stomach D1 through D3  . Colonoscopy  10/10/2009    anal papilla otherwise normal   Family History  Problem Relation Age of Onset  . Leukemia Father 69  . Seizures Mother   . Bipolar disorder Brother   . ADD / ADHD Brother    History  Substance Use Topics  . Smoking status: Never Smoker   . Smokeless tobacco: Current User    Types: Chew     Comment: DIPx 10 years  . Alcohol Use: Yes     Comment: weekends    Review of Systems  Constitutional: Negative for  fever.  Respiratory: Negative for shortness of breath.   Cardiovascular: Negative for chest pain and leg swelling.  Gastrointestinal: Negative for abdominal pain, constipation and abdominal distention.  Genitourinary: Negative for dysuria, urgency, frequency, flank pain and difficulty urinating.  Musculoskeletal: Positive for back pain. Negative for joint swelling and gait problem.  Skin: Negative for rash.  Neurological: Negative for weakness and numbness.  All other systems reviewed and are negative.     Allergies  Influenza vaccines; Tramadol; Ibuprofen; and Tylenol  Home Medications   Prior to Admission medications   Medication Sig Start Date End Date Taking?  Authorizing Provider  Azelastine-Fluticasone (DYMISTA) 137-50 MCG/ACT SUSP Place 2 sprays into both nostrils at bedtime. 05/12/14   Deneise Lever, MD  citalopram (CELEXA) 10 MG tablet Take 10 mg by mouth daily.    Historical Provider, MD  clotrimazole (LOTRIMIN) 1 % cream Apply to affected area 2 times daily 04/11/14   Arville Lime Schinlever, PA-C  cyclobenzaprine (FLEXERIL) 10 MG tablet Take 1 tablet (10 mg total) by mouth 2 (two) times daily as needed for muscle spasms. 01/02/15   Johnna Acosta, MD  diazepam (VALIUM) 5 MG tablet Take 1 tablet (5 mg total) by mouth 2 (two) times daily. 01/03/15   Carlisle Cater, PA-C  diclofenac sodium (VOLTAREN) 1 % GEL Apply 2 g topically 4 (four) times daily as needed (for pain in ankle). 08/19/13   Lysbeth Penner, FNP  famotidine (PEPCID) 20 MG tablet Take 1 tablet (20 mg total) by mouth 2 (two) times daily. 09/22/14   Jennifer L Piepenbrink, PA-C  hydrOXYzine (ATARAX/VISTARIL) 25 MG tablet Take 25 mg by mouth daily.    Historical Provider, MD  loperamide (IMODIUM) 2 MG capsule Take 1 capsule (2 mg total) by mouth 4 (four) times daily as needed for diarrhea or loose stools. 09/22/14   Jennifer L Piepenbrink, PA-C  methocarbamol (ROBAXIN) 500 MG tablet Take 1 tablet (500 mg total) by mouth 2 (two) times daily. 10/21/14   April K Palumbo-Rasch, MD  ondansetron (ZOFRAN ODT) 4 MG disintegrating tablet Take 1 tablet (4 mg total) by mouth every 8 (eight) hours as needed for nausea. 09/22/14   Jennifer L Piepenbrink, PA-C  ondansetron (ZOFRAN) 4 MG tablet Take 1 tablet (4 mg total) by mouth every 6 (six) hours. 09/27/14   Ezequiel Essex, MD  OXcarbazepine (TRILEPTAL) 150 MG tablet Take 150 mg by mouth 2 (two) times daily.    Historical Provider, MD  oxyCODONE (ROXICODONE) 5 MG immediate release tablet Take 1 tablet (5 mg total) by mouth every 4 (four) hours as needed for severe pain. 01/02/15   Johnna Acosta, MD  pantoprazole (PROTONIX) 40 MG tablet Take 1 tablet (40 mg  total) by mouth daily. 03/31/14 07/15/15  Lysbeth Penner, FNP  predniSONE (DELTASONE) 20 MG tablet 3 tabs po day one, then 2 po daily x 4 days 10/21/14   April K Palumbo-Rasch, MD   BP 131/79 mmHg  Pulse 78  Temp(Src) 97.7 F (36.5 C) (Oral)  Resp 24  SpO2 100%  Physical Exam  Constitutional: He is oriented to person, place, and time. He appears well-developed and well-nourished. No distress.  Pt is obese and appears to be in pain.   HENT:  Head: Normocephalic and atraumatic.  Right Ear: External ear normal.  Left Ear: External ear normal.  Eyes: Conjunctivae and EOM are normal. Pupils are equal, round, and reactive to light.  Neck: Normal range of motion. Neck supple. No JVD present.  Cardiovascular: Normal rate, regular rhythm and normal heart sounds.  Exam reveals no gallop and no friction rub.   No murmur heard. Pulmonary/Chest: Effort normal and breath sounds normal. He has no wheezes. He has no rales. He exhibits no tenderness.  Abdominal: Soft. Bowel sounds are normal. He exhibits no distension and no mass. There is no tenderness.  Musculoskeletal: Normal range of motion. He exhibits tenderness. He exhibits no edema.  Mark tenderness right lower back with moderate muscle spasm. Positive straight leg raise on the right at 15 degrees and on the left at 30 degrees.   Lymphadenopathy:    He has no cervical adenopathy.  Neurological: He is alert and oriented to person, place, and time. No cranial nerve deficit. Coordination normal.  Skin: Skin is warm and dry. No rash noted.  Psychiatric: He has a normal mood and affect. His behavior is normal. Thought content normal.  Nursing note and vitals reviewed.   ED Course  Procedures (including critical care time)  DIAGNOSTIC STUDIES: Oxygen Saturation is 100% on RA, normal by my interpretation.    COORDINATION OF CARE: 2:31 AM- Imaging from 2/9 was reviewed and discussed with the pt once again. Advised him to follow up with his PCP.  Will d/c pt with different muscle relaxant prescription. Pt states that he is allergic to Ibuprofen, secondary to his h/o of stomach ulcers. Pt advised of plan for treatment and pt agrees.  Medications  HYDROmorphone (DILAUDID) injection 1 mg (not administered)  ondansetron (ZOFRAN) injection 4 mg (not administered)  methocarbamol (ROBAXIN) 1,000 mg in dextrose 5 % 50 mL IVPB (not administered)   Labs Review Results for orders placed or performed during the hospital encounter of 23/76/28  Basic metabolic panel  Result Value Ref Range   Sodium 140 135 - 145 mmol/L   Potassium 4.6 3.5 - 5.1 mmol/L   Chloride 104 96 - 112 mmol/L   CO2 29 19 - 32 mmol/L   Glucose, Bld 108 (H) 70 - 99 mg/dL   BUN 12 6 - 23 mg/dL   Creatinine, Ser 0.96 0.50 - 1.35 mg/dL   Calcium 9.4 8.4 - 10.5 mg/dL   GFR calc non Af Amer >90 >90 mL/min   GFR calc Af Amer >90 >90 mL/min   Anion gap 7 5 - 15  CBC with Differential  Result Value Ref Range   WBC 14.0 (H) 4.0 - 10.5 K/uL   RBC 5.35 4.22 - 5.81 MIL/uL   Hemoglobin 15.3 13.0 - 17.0 g/dL   HCT 45.3 39.0 - 52.0 %   MCV 84.7 78.0 - 100.0 fL   MCH 28.6 26.0 - 34.0 pg   MCHC 33.8 30.0 - 36.0 g/dL   RDW 13.7 11.5 - 15.5 %   Platelets 274 150 - 400 K/uL   Neutrophils Relative % 77 43 - 77 %   Neutro Abs 10.7 (H) 1.7 - 7.7 K/uL   Lymphocytes Relative 17 12 - 46 %   Lymphs Abs 2.4 0.7 - 4.0 K/uL   Monocytes Relative 5 3 - 12 %   Monocytes Absolute 0.7 0.1 - 1.0 K/uL   Eosinophils Relative 1 0 - 5 %   Eosinophils Absolute 0.2 0.0 - 0.7 K/uL   Basophils Relative 0 0 - 1 %   Basophils Absolute 0.0 0.0 - 0.1 K/uL   Imaging Review Ct Renal Stone Study  01/03/2015   CLINICAL DATA:  Right flank pain, back pain.  EXAM: CT ABDOMEN AND PELVIS WITHOUT CONTRAST  TECHNIQUE:  Multidetector CT imaging of the abdomen and pelvis was performed following the standard protocol without IV contrast.  COMPARISON:  06/27/2014  FINDINGS: Lung bases are clear.  No effusions.  Heart is  normal size.  Liver, gallbladder, spleen, pancreas, adrenals right kidney have an unremarkable unenhanced appearance. No renal or ureteral stones. Low-density lesion again noted in the lower pole of the left kidney measuring 3 cm, likely cyst. No hydronephrosis. Urinary bladder is unremarkable.  Stomach, large and small bowel unremarkable. Appendix is visualized and is normal. No free fluid, free air or adenopathy. Aorta is normal caliber.  No acute bony abnormality or focal bone lesion.  IMPRESSION: No renal or ureteral stones.  No hydronephrosis.  Normal appendix.  No acute findings.   Electronically Signed   By: Rolm Baptise M.D.   On: 01/03/2015 11:38    MDM   Final diagnoses:  Right-sided low back pain without sciatica    Severe low back pain which is not responding to current medications of cyclobenzaprine and oxycodone. Old records are reviewed and he had CT scan done at a recent visit looking for kidney stones. I reviewed those images and there is no obvious spine issue other than some mild osteophyte formation. He is given to venous methocarbamol and hydromorphone with moderate relief of pain. He was given additional dose of hydromorphone with further improvement in pain control. He is discharged with prescriptions for oxycodone and diazepam and is to follow-up with his PCP.  I personally performed the services described in this documentation, which was scribed in my presence. The recorded information has been reviewed and is accurate.      Phillip Fuel, MD 66/06/30 1601

## 2015-01-05 NOTE — Discharge Instructions (Signed)
Back Pain, Adult °Low back pain is very common. About 1 in 5 people have back pain. The cause of low back pain is rarely dangerous. The pain often gets better over time. About half of people with a sudden onset of back pain feel better in just 2 weeks. About 8 in 10 people feel better by 6 weeks.  °CAUSES °Some common causes of back pain include: °· Strain of the muscles or ligaments supporting the spine. °· Wear and tear (degeneration) of the spinal discs. °· Arthritis. °· Direct injury to the back. °DIAGNOSIS °Most of the time, the direct cause of low back pain is not known. However, back pain can be treated effectively even when the exact cause of the pain is unknown. Answering your caregiver's questions about your overall health and symptoms is one of the most accurate ways to make sure the cause of your pain is not dangerous. If your caregiver needs more information, he or she may order lab work or imaging tests (X-rays or MRIs). However, even if imaging tests show changes in your back, this usually does not require surgery. °HOME CARE INSTRUCTIONS °For many people, back pain returns. Since low back pain is rarely dangerous, it is often a condition that people can learn to manage on their own.  °· Remain active. It is stressful on the back to sit or stand in one place. Do not sit, drive, or stand in one place for more than 30 minutes at a time. Take short walks on level surfaces as soon as pain allows. Try to increase the length of time you walk each day. °· Do not stay in bed. Resting more than 1 or 2 days can delay your recovery. °· Do not avoid exercise or work. Your body is made to move. It is not dangerous to be active, even though your back may hurt. Your back will likely heal faster if you return to being active before your pain is gone. °· Pay attention to your body when you  bend and lift. Many people have less discomfort when lifting if they bend their knees, keep the load close to their bodies, and  avoid twisting. Often, the most comfortable positions are those that put less stress on your recovering back. °· Find a comfortable position to sleep. Use a firm mattress and lie on your side with your knees slightly bent. If you lie on your back, put a pillow under your knees. °· Only take over-the-counter or prescription medicines as directed by your caregiver. Over-the-counter medicines to reduce pain and inflammation are often the most helpful. Your caregiver may prescribe muscle relaxant drugs. These medicines help dull your pain so you can more quickly return to your normal activities and healthy exercise. °· Put ice on the injured area. °¨ Put ice in a plastic bag. °¨ Place a towel between your skin and the bag. °¨ Leave the ice on for 15-20 minutes, 03-04 times a day for the first 2 to 3 days. After that, ice and heat may be alternated to reduce pain and spasms. °· Ask your caregiver about trying back exercises and gentle massage. This may be of some benefit. °· Avoid feeling anxious or stressed. Stress increases muscle tension and can worsen back pain. It is important to recognize when you are anxious or stressed and learn ways to manage it. Exercise is a great option. °SEEK MEDICAL CARE IF: °· You have pain that is not relieved with rest or medicine. °· You have pain that does not improve in 1 week. °· You have new symptoms. °· You are generally not feeling well. °SEEK   IMMEDIATE MEDICAL CARE IF:   You have pain that radiates from your back into your legs.  You develop new bowel or bladder control problems.  You have unusual weakness or numbness in your arms or legs.  You develop nausea or vomiting.  You develop abdominal pain.  You feel faint. Document Released: 11/11/2005 Document Revised: 05/12/2012 Document Reviewed: 03/15/2014 [facility] Patient Information 2015 [place], [place]. This information is not intended to replace advice given to you by your health care provider. Make sure you  discuss any questions you have with your health care provider.  Oxycodone tablets or capsules What is this medicine? OXYCODONE (ox i KOE done) is a pain reliever. It is used to treat moderate to severe pain. This medicine may be used for other purposes; ask your health care provider or pharmacist if you have questions. COMMON BRAND NAME(S): Dazidox, Endocodone, OXECTA, OxyIR, Percolone, Roxicodone What should I tell my health care provider before I take this medicine? They need to know if you have any of these conditions: -Addison's disease -brain tumor -drug abuse or addiction -head injury -heart disease -if you frequently drink alcohol containing drinks -kidney disease or problems going to the bathroom -liver disease -lung disease, asthma, or breathing problems -mental problems -an unusual or allergic reaction to oxycodone, codeine, hydrocodone, morphine, other medicines, foods, dyes, or preservatives -pregnant or trying to get pregnant -breast-feeding How should I use this medicine? Take this medicine by mouth with a glass of water. Follow the directions on the prescription label. You can take it with or without food. If it upsets your stomach, take it with food. Take your medicine at regular intervals. Do not take it more often than directed. Do not stop taking except on your doctor's advice. Some brands of this medicine, like Oxecta, have special instructions. Ask your doctor or pharmacist if these directions are for you: Do not cut, crush or chew this medicine. Swallow only one tablet at a time. Do not wet, soak, or lick the tablet before you take it. Talk to your pediatrician regarding the use of this medicine in children. Special care may be needed. Overdosage: If you think you have taken too much of this medicine contact a poison control center or emergency room at once. NOTE: This medicine is only for you. Do not share this medicine with others. What if I miss a dose? If you  miss a dose, take it as soon as you can. If it is almost time for your next dose, take only that dose. Do not take double or extra doses. What may interact with this medicine? -alcohol -antihistamines -certain medicines used for nausea like chlorpromazine, droperidol -erythromycin -ketoconazole -medicines for depression, anxiety, or psychotic disturbances -medicines for sleep -muscle relaxants -naloxone -naltrexone -narcotic medicines (opiates) for pain -nilotinib -phenobarbital -phenytoin -rifampin -ritonavir -voriconazole This list may not describe all possible interactions. Give your health care provider a list of all the medicines, herbs, non-prescription drugs, or dietary supplements you use. Also tell them if you smoke, drink alcohol, or use illegal drugs. Some items may interact with your medicine. What should I watch for while using this medicine? Tell your doctor or health care professional if your pain does not go away, if it gets worse, or if you have new or a different type of pain. You may develop tolerance to the medicine. Tolerance means that you will need a higher dose of the medicine for pain relief. Tolerance is normal and is expected if you take this medicine  for a long time. Do not suddenly stop taking your medicine because you may develop a severe reaction. Your body becomes used to the medicine. This does NOT mean you are addicted. Addiction is a behavior related to getting and using a drug for a non-medical reason. If you have pain, you have a medical reason to take pain medicine. Your doctor will tell you how much medicine to take. If your doctor wants you to stop the medicine, the dose will be slowly lowered over time to avoid any side effects. You may get drowsy or dizzy when you first start taking this medicine or change doses. Do not drive, use machinery, or do anything that may be dangerous until you know how the medicine affects you. Stand or sit up slowly. There  are different types of narcotic medicines (opiates) for pain. If you take more than one type at the same time, you may have more side effects. Give your health care provider a list of all medicines you use. Your doctor will tell you how much medicine to take. Do not take more medicine than directed. Call emergency for help if you have problems breathing. This medicine will cause constipation. Try to have a bowel movement at least every 2 to 3 days. If you do not have a bowel movement for 3 days, call your doctor or health care professional. Your mouth may get dry. Drinking water, chewing sugarless gum, or sucking on hard candy may help. See your dentist every 6 months. What side effects may I notice from receiving this medicine? Side effects that you should report to your doctor or health care professional as soon as possible: -allergic reactions like skin rash, itching or hives, swelling of the face, lips, or tongue -breathing problems -confusion -feeling faint or lightheaded, falls -trouble passing urine or change in the amount of urine -unusually weak or tired Side effects that usually do not require medical attention (report to your doctor or health care professional if they continue or are bothersome): -constipation -dry mouth -itching -nausea, vomiting -upset stomach This list may not describe all possible side effects. Call your doctor for medical advice about side effects. You may report side effects to FDA at 1-800-FDA-1088. Where should I keep my medicine? Keep out of the reach of children. This medicine can be abused. Keep your medicine in a safe place to protect it from theft. Do not share this medicine with anyone. Selling or giving away this medicine is dangerous and against the law. Store at room temperature between 15 and 30 degrees C (59 and 86 degrees F). Protect from light. Keep container tightly closed. This medicine may cause accidental overdose and death if it is taken by  other adults, children, or pets. Flush any unused medicine down the toilet to reduce the chance of harm. Do not use the medicine after the expiration date. NOTE: This sheet is a summary. It may not cover all possible information. If you have questions about this medicine, talk to your doctor, pharmacist, or health care provider.  2015, [employer]. (2013-07-22 13:43:33)  Diazepam tablets What is this medicine? DIAZEPAM (dye AZ e pam) is a benzodiazepine. It is used to treat anxiety and nervousness. It also can help treat alcohol withdrawal, relax muscles, and treat certain types of seizures. This medicine may be used for other purposes; ask your health care provider or pharmacist if you have questions. COMMON BRAND NAME(S): Valium What should I tell my health care provider before I take this  medicine? They need to know if you have any of these conditions -an alcohol or drug abuse problem -bipolar disorder, depression, psychosis or other mental health condition -glaucoma -kidney or liver disease -lung or breathing disease -myasthenia gravis -Parkinson's disease -seizures or a history of seizures -suicidal thoughts -an unusual or allergic reaction to diazepam, other benzodiazepines, foods, dyes, or preservatives -pregnant or trying to get pregnant -breast-feeding How should I use this medicine? Take this medicine by mouth with a glass of water. Follow the directions on the prescription label. If this medicine upsets your stomach, take it with food or milk. Take your doses at regular intervals. Do not take your medicine more often than directed. If you have been taking this medicine regularly for some time, do not suddenly stop taking it. You must gradually reduce the dose or you may get severe side effects. Ask your doctor or health care professional for advice. Even after you stop taking this medicine it can still affect your body for several days. Talk to your pediatrician  regarding the use of this medicine in children. Special care may be needed. Overdosage: If you think you have taken too much of this medicine contact a poison control center or emergency room at once. NOTE: This medicine is only for you. Do not share this medicine with others. What if I miss a dose? If you miss a dose, take it as soon as you can. If it is almost time for your next dose, take only that dose. Do not take double or extra doses. What may interact with this medicine? -cimetidine -grapefruit juice -herbal or dietary supplements like kava kava, melatonin, St. John's Wort, or valerian -medicines for anxiety or sleeping problems, like alprazolam, lorazepam, or triazolam -medicines for depression, mental problems or psychiatric disturbances -medicines for HIV infection or AIDS -prescription pain medicines -rifampin, rifapentine, or rifabutin -some medicines for seizures like carbamazepine, phenobarbital, phenytoin, or primidone This list may not describe all possible interactions. Give your health care provider a list of all the medicines, herbs, non-prescription drugs, or dietary supplements you use. Also tell them if you smoke, drink alcohol, or use illegal drugs. Some items may interact with your medicine. What should I watch for while using this medicine? Visit your doctor or health care professional for regular checks on your progress. Your body can become dependent on this medicine. Ask your doctor or health care professional if you still need to take it. You may get drowsy or dizzy. Do not drive, use machinery, or do anything that needs mental alertness until you know how this medicine affects you. To reduce the risk of dizzy and fainting spells, do not stand or sit up quickly, especially if you are an older patient. Alcohol may increase dizziness and drowsiness. Avoid alcoholic drinks. Do not treat yourself for coughs, colds or allergies without asking your doctor or health care  professional for advice. Some ingredients can increase possible side effects. What side effects may I notice from receiving this medicine? Side effects that you should report to your doctor or health care professional as soon as possible: -allergic reactions like skin rash, itching or hives, swelling of the face, lips, or tongue -angry, confused, depressed, other mood changes -breathing problems -feeling faint or lightheaded, falls -muscle cramps -problems with balance, talking, walking -restlessness -tremors -trouble passing urine or change in the amount of urine -unusually weak or tired Side effects that usually do not require medical attention (report to your doctor or health care professional if  they continue or are bothersome): -difficulty sleeping, nightmares -dizziness, drowsiness, clumsiness, or unsteadiness, a hangover effect -headache -nausea, vomiting This list may not describe all possible side effects. Call your doctor for medical advice about side effects. You may report side effects to FDA at 1-800-FDA-1088. Where should I keep my medicine? Keep out of the reach of children. This medicine can be abused. Keep your medicine in a safe place to protect it from theft. Do not share this medicine with anyone. Selling or giving away this medicine is dangerous and against the law. Store at room temperature between 15 and 30 degrees C (59 and 86 degrees F). Protect from light. Keep container tightly closed. Throw away any unused medicine after the expiration date. NOTE: This sheet is a summary. It may not cover all possible information. If you have questions about this medicine, talk to your doctor, pharmacist, or health care provider.  2015, [employer] Standard. (2008-02-29 16:57:35)

## 2015-02-05 ENCOUNTER — Emergency Department (HOSPITAL_BASED_OUTPATIENT_CLINIC_OR_DEPARTMENT_OTHER)
Admission: EM | Admit: 2015-02-05 | Discharge: 2015-02-05 | Disposition: A | Discharge status: Home / Self Care | Payer: 59 | Attending: Emergency Medicine | Admitting: Emergency Medicine

## 2015-02-05 ENCOUNTER — Encounter (HOSPITAL_BASED_OUTPATIENT_CLINIC_OR_DEPARTMENT_OTHER): Payer: Self-pay | Admitting: *Deleted

## 2015-02-05 ENCOUNTER — Emergency Department (HOSPITAL_BASED_OUTPATIENT_CLINIC_OR_DEPARTMENT_OTHER): Payer: 59

## 2015-02-05 DIAGNOSIS — K219 Gastro-esophageal reflux disease without esophagitis: Secondary | ICD-10-CM | POA: Insufficient documentation

## 2015-02-05 DIAGNOSIS — F319 Bipolar disorder, unspecified: Secondary | ICD-10-CM | POA: Diagnosis not present

## 2015-02-05 DIAGNOSIS — M549 Dorsalgia, unspecified: Secondary | ICD-10-CM | POA: Diagnosis present

## 2015-02-05 DIAGNOSIS — N451 Epididymitis: Secondary | ICD-10-CM | POA: Diagnosis not present

## 2015-02-05 DIAGNOSIS — Z791 Long term (current) use of non-steroidal anti-inflammatories (NSAID): Secondary | ICD-10-CM | POA: Diagnosis not present

## 2015-02-05 DIAGNOSIS — Z8614 Personal history of Methicillin resistant Staphylococcus aureus infection: Secondary | ICD-10-CM | POA: Diagnosis not present

## 2015-02-05 DIAGNOSIS — Z79899 Other long term (current) drug therapy: Secondary | ICD-10-CM | POA: Insufficient documentation

## 2015-02-05 DIAGNOSIS — N50819 Testicular pain, unspecified: Secondary | ICD-10-CM

## 2015-02-05 LAB — URINALYSIS, ROUTINE W REFLEX MICROSCOPIC
Bilirubin Urine: NEGATIVE
GLUCOSE, UA: NEGATIVE mg/dL
Hgb urine dipstick: NEGATIVE
Ketones, ur: NEGATIVE mg/dL
Leukocytes, UA: NEGATIVE
Nitrite: NEGATIVE
PH: 7.5 (ref 5.0–8.0)
PROTEIN: NEGATIVE mg/dL
Specific Gravity, Urine: 1.016 (ref 1.005–1.030)
Urobilinogen, UA: 0.2 mg/dL (ref 0.0–1.0)

## 2015-02-05 MED ORDER — OXYCODONE-ACETAMINOPHEN 5-325 MG PO TABS: 2.0000 | ORAL_TABLET | ORAL | Status: DC | PRN

## 2015-02-05 MED ORDER — OXYCODONE-ACETAMINOPHEN 5-325 MG PO TABS: 1.0000 | ORAL_TABLET | Freq: Once | ORAL | Status: DC

## 2015-02-05 MED ORDER — OXYCODONE HCL 5 MG PO TABS
5.0000 mg | ORAL_TABLET | Freq: Once | ORAL | Status: AC
  Administered 2015-02-05: 5 mg via ORAL
  Filled 2015-02-05: qty 1

## 2015-02-05 MED ORDER — LEVOFLOXACIN 500 MG PO TABS: 500.0000 mg | ORAL_TABLET | Freq: Every day | ORAL | Status: DC

## 2015-02-05 MED ORDER — ONDANSETRON HCL 8 MG PO TABS
4.0000 mg | ORAL_TABLET | Freq: Once | ORAL | Status: AC
  Administered 2015-02-05: 4 mg via ORAL
  Filled 2015-02-05: qty 1

## 2015-02-05 NOTE — ED Notes (Addendum)
Pt alert, NAD, calm, interactive, pinpoints pain to bilateral testicles, R upper thigh and lower back, (denies: rectal pain, abd/inguinal pain). Last BM today (normal). Last void 2 hrs ago (normal). No meds PTA. "Cannot have tylenol (rash) or ibuprofen (ulcers)". (denies: urinary sx, diarrhea), mentions: fever and nv.

## 2015-02-05 NOTE — ED Notes (Signed)
Remains in [place].

## 2015-02-05 NOTE — Discharge Instructions (Signed)
Epididymitis Epididymitis is a swelling (inflammation) of the epididymis. The epididymis is a cord-like structure along the back part of the testicle. Epididymitis is usually, but not always, caused by infection. This is usually a sudden problem beginning with chills, fever and pain behind the scrotum and in the testicle. There may be swelling and redness of the testicle. DIAGNOSIS  Physical examination will reveal a tender, swollen epididymis. Sometimes, cultures are obtained from the urine or from prostate secretions to help find out if there is an infection or if the cause is a different problem. Sometimes, blood work is performed to see if your white blood cell count is elevated and if a germ (bacterial) or viral infection is present. Using this knowledge, an appropriate medicine which kills germs (antibiotic) can be chosen by your caregiver. A viral infection causing epididymitis will most often go away (resolve) without treatment. HOME CARE INSTRUCTIONS   Hot sitz baths for 20 minutes, 4 times per day, may help relieve pain.  Only take over-the-counter or prescription medicines for pain, discomfort or fever as directed by your caregiver.  Take all medicines, including antibiotics, as directed. Take the antibiotics for the full prescribed length of time even if you are feeling better.  It is very important to keep all follow-up appointments. SEEK IMMEDIATE MEDICAL CARE IF:   You have a fever.  You have pain not relieved with medicines.  You have any worsening of your problems.  Your pain seems to come and go.  You develop pain, redness, and swelling in the scrotum and surrounding areas. MAKE SURE YOU:   Understand these instructions.  Will watch your condition.  Will get help right away if you are not doing well or get worse. Document Released: 11/08/2000 Document Revised: 02/03/2012 Document Reviewed: 09/28/2009 [facility] Patient Information 2015 [place], [place]. This information  is not intended to replace advice given to you by your health care provider. Make sure you discuss any questions you have with your health care provider.  Follow up with your primary care physician.  Take medication as prescribed.  You will be called if the medication is resistant to the organism found in the culture.

## 2015-02-05 NOTE — ED Notes (Signed)
Back from [place], pain med given, no changes. Alert, NAD, calm, interactive, wife at [facility].

## 2015-02-05 NOTE — ED Provider Notes (Signed)
CSN: [id number]     Arrival date & time 02/05/15  1845 History   First MD Initiated Contact with Patient 02/05/15 2020     Chief Complaint  Patient presents with  . Back Pain     (Consider location/radiation/quality/duration/timing/severity/associated sxs/prior Treatment) Patient is a 29 y.o. male presenting with back pain. The history is provided by the patient and the spouse. No language interpreter was used.  Back Pain Associated symptoms: no abdominal pain and no fever   [name] is a 29 y.o white male that presents with scrotal pain that has gradually gotten worse over the last 2 weeks. He says the pain is 8-9/10 now. It is constant and stabbing in nature. The pain is worse with movement and standing and when he lays down, "it takes the edge off."  He denies any urinary symptoms, no fever, no abdominal pain.   Past Medical History  Diagnosis Date  . Tobacco dipper   . GERD (gastroesophageal reflux disease)   . ADHD (attention deficit hyperactivity disorder)   . Hx MRSA infection 8/08    right thigh  . S/P colonoscopy 11/10, 10/08    Dr [name]  . S/P endoscopy 10/08, 2/10, 5/10    Dr Raliegh Scarlet reflux esophagitis, small HH  . Occult GI bleeding 12/2008    Trivial upper GI bleed/uncontrolled GERD by upper endoscopy 12/2008, normal f/u endoscopy 03/2009 with SBCE at that time  . Bipolar 1 disorder   . Esophagitis     Distal esophageal erosions consistent with mild erosive  reflux esophagitis 12/2008 by EGD   . Thyroid function test abnormal     Noted in 2011 discharge  . Obesity   . Gastric ulcer   . Hypercholesteremia   . Hiatal hernia    Past Surgical History  Procedure Laterality Date  . Vasectomy    . Ankle surgery    . Small bowel capsule  04/11/2009     normal throughout  . Esophagogastroduodenoscopy   04/07/2009    Normal esophagus, small hiatal hernia  . Esophagogastroduodenoscopy  01/09/2009    Distal esophageal erosions consistent with mild  erosive reflux esophagitis, otherwise normal esophagus, small hiatal herniaotherwise normal stomach, D1-D2   . Ileocolonoscopy  08/26/2007     A normal rectum, colon, and terminal ileum  . Esophagogastroduodenoscopy  08/26/2007    Normal esophagus, a small hiatal/hernia, otherwise normal stomach D1 through D3  . Colonoscopy  10/10/2009    anal papilla otherwise normal   Family History  Problem Relation Age of Onset  . Leukemia Father 66  . Seizures Mother   . Bipolar disorder Brother   . ADD / ADHD Brother    History  Substance Use Topics  . Smoking status: Never Smoker   . Smokeless tobacco: Current User    Types: Chew     Comment: DIPx 10 years  . Alcohol Use: Yes     Comment: weekends    Review of Systems  Constitutional: Negative for fever and chills.  Gastrointestinal: Negative for abdominal pain.  Genitourinary: Positive for testicular pain. Negative for hematuria, penile swelling and difficulty urinating.  Musculoskeletal: Positive for back pain.  All other systems reviewed and are negative.     Allergies  Influenza vaccines; Tramadol; Ibuprofen; and Tylenol  Home Medications   Prior to Admission medications   Medication Sig Start Date End Date Taking? Authorizing Provider  citalopram (CELEXA) 10 MG tablet Take 10 mg by mouth daily.   Yes Historical Provider, MD  diclofenac  sodium (VOLTAREN) 1 % GEL Apply 2 g topically 4 (four) times daily as needed (for pain in ankle). 08/19/13  Yes Lysbeth Penner, FNP  hydrOXYzine (ATARAX/VISTARIL) 25 MG tablet Take 25 mg by mouth 2 (two) times daily.    Yes Historical Provider, MD  OXcarbazepine (TRILEPTAL) 150 MG tablet Take 150 mg by mouth 2 (two) times daily.   Yes Historical Provider, MD  pantoprazole (PROTONIX) 40 MG tablet Take 1 tablet (40 mg total) by mouth daily. 03/31/14 07/15/15 Yes Lysbeth Penner, FNP  Azelastine-Fluticasone (DYMISTA) 137-50 MCG/ACT SUSP Place 2 sprays into both nostrils at bedtime. Patient not  taking: Reported on 01/05/2015 05/12/14   Deneise Lever, MD  clotrimazole (LOTRIMIN) 1 % cream Apply to affected area 2 times daily Patient not taking: Reported on 01/05/2015 04/11/14   Gertha Calkin, PA-C  diazepam (VALIUM) 10 MG tablet Take 1 tablet (10 mg total) by mouth 3 (three) times daily as needed for muscle spasms. 0/09/38   [name], MD  famotidine (PEPCID) 20 MG tablet Take 1 tablet (20 mg total) by mouth 2 (two) times daily. Patient not taking: Reported on 01/05/2015 09/22/14   Baron Sane, PA-C  levofloxacin (LEVAQUIN) 500 MG tablet Take 1 tablet (500 mg total) by mouth daily. 02/05/15   Taylormarie Register Patel-Mills, PA-C  loperamide (IMODIUM) 2 MG capsule Take 1 capsule (2 mg total) by mouth 4 (four) times daily as needed for diarrhea or loose stools. 09/22/14   Jennifer Piepenbrink, PA-C  ondansetron (ZOFRAN ODT) 4 MG disintegrating tablet Take 1 tablet (4 mg total) by mouth every 8 (eight) hours as needed for nausea. Patient not taking: Reported on 01/05/2015 09/22/14   Baron Sane, PA-C  ondansetron (ZOFRAN) 4 MG tablet Take 1 tablet (4 mg total) by mouth every 6 (six) hours. Patient not taking: Reported on 01/05/2015 09/27/14   Ezequiel Essex, MD  Oxycodone HCl 10 MG TABS Take 1.5 tablets (15 mg total) by mouth every 4 (four) hours as needed for pain. 1/82/99   [name], MD  predniSONE (DELTASONE) 20 MG tablet 3 tabs po day one, then 2 po daily x 4 days Patient not taking: Reported on 01/05/2015 10/21/14   April Palumbo, MD   BP 113/54 mmHg  Pulse 67  Temp(Src) 98 F (36.7 C) (Oral)  Resp 18  Ht 6' (1.829 m)  Wt 357 lb (161.934 kg)  BMI 48.41 kg/m2  SpO2 97% Physical Exam  Constitutional: He is oriented to person, place, and time. He appears well-developed and well-nourished.  Morbidly obese.   Cardiovascular: Normal rate, regular rhythm and normal heart sounds.   Pulmonary/Chest: Effort normal and breath sounds normal.  Genitourinary:  Testicular exam:  No mass no swelling. Right testicle is tender to palpation. No hernia. No tenderness to palpation of abdomen. Urethral swab was done.   Chaperone present.  Musculoskeletal:  No reproducible back pain. Able to ambulate without difficulty.   Neurological: He is alert and oriented to person, place, and time.  Skin: Skin is warm and dry.  Nursing note and vitals reviewed.   ED Course  Procedures (including critical care time) Labs Review Labs Reviewed  URINALYSIS, ROUTINE W REFLEX MICROSCOPIC  GC/CHLAMYDIA PROBE AMP ([facility])    Imaging Review US Scrotum  02/05/2015   CLINICAL DATA:  Bilateral testicular pain right worse than left for 1 month worsening over the past week. Two years post vasectomy. Pain worse with movement.  EXAM: SCROTAL ULTRASOUND  DOPPLER ULTRASOUND OF THE TESTICLES  TECHNIQUE:  Complete ultrasound examination of the testicles, epididymis, and other scrotal structures was performed. Color and spectral Doppler ultrasound were also utilized to evaluate blood flow to the testicles.  COMPARISON:  02/12/2014  FINDINGS: Right testicle  Measurements: 2.3 x 3.4 x 5.1 cm. No mass or microlithiasis visualized.  Left testicle  Measurements: 2.7 x 3.5 x 4.6 cm. 2 x 2 x 3 mm Mild the echogenic focus without shadowing over the inferior aspect of the testicle unchanged.  Right epididymis: 4 mm cyst versus spermatocele over the epididymal head. Heterogeneous with increased vascular flow to the tail of the epididymis.  Left epididymis: Heterogeneous with mildly increased vascular flow compared to the right.  Hydrocele:  Small bilateral.  Pulsed Doppler interrogation of both testes demonstrates normal low resistance arterial and venous waveforms bilaterally.  IMPRESSION: Mild heterogeneity and increased vascularity over the head of the left epididymis and over the tail of the right epididymis as these findings may seen with acute epididymitis. Small bilateral hydroceles. No evidence of orchitis.   2 x 2 x 3 mm slightly echogenic focus over the inferior aspect of the left testicle unchanged and likely of no clinical significance.  4 mm right epididymal cyst versus spermatocele.   Electronically Signed   By: Marin Olp M.D.   On: 02/05/2015 22:04   [place] Art/ven Flow Abd Pelv Doppler  02/05/2015   CLINICAL DATA:  Bilateral testicular pain right worse than left for 1 month worsening over the past week. Two years post vasectomy. Pain worse with movement.  EXAM: SCROTAL ULTRASOUND  DOPPLER ULTRASOUND OF THE TESTICLES  TECHNIQUE: Complete ultrasound examination of the testicles, epididymis, and other scrotal structures was performed. Color and spectral Doppler ultrasound were also utilized to evaluate blood flow to the testicles.  COMPARISON:  02/12/2014  FINDINGS: Right testicle  Measurements: 2.3 x 3.4 x 5.1 cm. No mass or microlithiasis visualized.  Left testicle  Measurements: 2.7 x 3.5 x 4.6 cm. 2 x 2 x 3 mm Mild the echogenic focus without shadowing over the inferior aspect of the testicle unchanged.  Right epididymis: 4 mm cyst versus spermatocele over the epididymal head. Heterogeneous with increased vascular flow to the tail of the epididymis.  Left epididymis: Heterogeneous with mildly increased vascular flow compared to the right.  Hydrocele:  Small bilateral.  Pulsed Doppler interrogation of both testes demonstrates normal low resistance arterial and venous waveforms bilaterally.  IMPRESSION: Mild heterogeneity and increased vascularity over the head of the left epididymis and over the tail of the right epididymis as these findings may seen with acute epididymitis. Small bilateral hydroceles. No evidence of orchitis.  2 x 2 x 3 mm slightly echogenic focus over the inferior aspect of the left testicle unchanged and likely of no clinical significance.  4 mm right epididymal cyst versus spermatocele.   Electronically Signed   By: Marin Olp M.D.   On: 02/05/2015 22:04     EKG  Interpretation None      MDM   Final diagnoses:  Epididymitis  He has a history of chronic back pain. He has been evaluated in the past.  He has no signs of cord compression, he is not an IV drug user and no history of cancer. I do not feel imaging is necessary. He refused pain medications due to his allergies. He has a new PCP that he will be following up with soon. He had tenderness to palpation of the left testical. The Scrotal US revealed increased vascularity over the head of  the left epididymis and over the tail of the right epididymis as seen in epididymitis. No evidence of orchitis.  UA normal. I put this patient on levofloxacin for 14 days. I also did a urethral swab. I informed him that the culture would take time to come back and in the event that it was positive for an organism not covered by the antibiotic, we would call him. He agreed with the plan.    Ottie Glazier, PA-C 02/05/15 2307  Alfonzo Beers, MD 02/05/15 [phone]

## 2015-02-05 NOTE — ED Notes (Addendum)
Back pain x 6 weeks- worse x 2 days with numbness in right leg- also pain in groin for last month- had scan at [facility] to r/o testicular torsion 3-4 weeks ago

## 2015-02-07 LAB — GC/CHLAMYDIA PROBE AMP (~~LOC~~) NOT AT ARMC
Chlamydia: NEGATIVE
Neisseria Gonorrhea: NEGATIVE

## 2015-02-08 ENCOUNTER — Other Ambulatory Visit: Payer: Self-pay | Admitting: Physician Assistant

## 2015-02-08 DIAGNOSIS — M5431 Sciatica, right side: Secondary | ICD-10-CM

## 2015-02-25 ENCOUNTER — Ambulatory Visit
Admission: RE | Admit: 2015-02-25 | Discharge: 2015-02-25 | Disposition: A | Discharge status: Home / Self Care | Payer: 59 | Source: Ambulatory Visit | Attending: Physician Assistant | Admitting: Physician Assistant

## 2015-02-25 DIAGNOSIS — M5431 Sciatica, right side: Secondary | ICD-10-CM

## 2015-03-22 ENCOUNTER — Emergency Department (HOSPITAL_COMMUNITY): Payer: 59

## 2015-03-22 ENCOUNTER — Encounter (HOSPITAL_COMMUNITY): Payer: Self-pay | Admitting: Emergency Medicine

## 2015-03-22 ENCOUNTER — Emergency Department (HOSPITAL_COMMUNITY)
Admission: EM | Admit: 2015-03-22 | Discharge: 2015-03-22 | Disposition: A | Discharge status: Home / Self Care | Payer: 59 | Attending: Emergency Medicine | Admitting: Emergency Medicine

## 2015-03-22 DIAGNOSIS — E669 Obesity, unspecified: Secondary | ICD-10-CM | POA: Diagnosis not present

## 2015-03-22 DIAGNOSIS — W1842XA Slipping, tripping and stumbling without falling due to stepping into hole or opening, initial encounter: Secondary | ICD-10-CM | POA: Diagnosis not present

## 2015-03-22 DIAGNOSIS — Y998 Other external cause status: Secondary | ICD-10-CM | POA: Insufficient documentation

## 2015-03-22 DIAGNOSIS — Z79899 Other long term (current) drug therapy: Secondary | ICD-10-CM | POA: Diagnosis not present

## 2015-03-22 DIAGNOSIS — F319 Bipolar disorder, unspecified: Secondary | ICD-10-CM | POA: Insufficient documentation

## 2015-03-22 DIAGNOSIS — Y9289 Other specified places as the place of occurrence of the external cause: Secondary | ICD-10-CM | POA: Diagnosis not present

## 2015-03-22 DIAGNOSIS — Y9389 Activity, other specified: Secondary | ICD-10-CM | POA: Insufficient documentation

## 2015-03-22 DIAGNOSIS — K219 Gastro-esophageal reflux disease without esophagitis: Secondary | ICD-10-CM | POA: Diagnosis not present

## 2015-03-22 DIAGNOSIS — Z8614 Personal history of Methicillin resistant Staphylococcus aureus infection: Secondary | ICD-10-CM | POA: Insufficient documentation

## 2015-03-22 DIAGNOSIS — S99911A Unspecified injury of right ankle, initial encounter: Secondary | ICD-10-CM | POA: Diagnosis present

## 2015-03-22 DIAGNOSIS — Z792 Long term (current) use of antibiotics: Secondary | ICD-10-CM | POA: Diagnosis not present

## 2015-03-22 DIAGNOSIS — S93401A Sprain of unspecified ligament of right ankle, initial encounter: Secondary | ICD-10-CM | POA: Diagnosis not present

## 2015-03-22 DIAGNOSIS — Z9889 Other specified postprocedural states: Secondary | ICD-10-CM | POA: Insufficient documentation

## 2015-03-22 DIAGNOSIS — R079 Chest pain, unspecified: Secondary | ICD-10-CM | POA: Insufficient documentation

## 2015-03-22 DIAGNOSIS — M545 Low back pain, unspecified: Secondary | ICD-10-CM

## 2015-03-22 MED ORDER — OXYCODONE-ACETAMINOPHEN 5-325 MG PO TABS
1.0000 | ORAL_TABLET | Freq: Once | ORAL | Status: DC
  Filled 2015-03-22: qty 1

## 2015-03-22 NOTE — ED Notes (Signed)
Pt reports lower back pain and right ankle pain for last several weeks. Pt reports had MRI completed of lower back and given a diagnosis of "6 ruptured discs." pt also reports "rolled" right ankle x1 week ago. nad noted.

## 2015-03-22 NOTE — Discharge Instructions (Signed)
Ankle Sprain °An ankle sprain is an injury to the strong, fibrous tissues (ligaments) that hold the bones of your ankle joint together.  °CAUSES °An ankle sprain is usually caused by a fall or by twisting your ankle. Ankle sprains most commonly occur when you step on the outer edge of your foot, and your ankle turns inward. People who participate in sports are more prone to these types of injuries.  °SYMPTOMS  °· Pain in your ankle. The pain may be present at rest or only when you are trying to stand or walk. °· Swelling. °· Bruising. Bruising may develop immediately or within 1 to 2 days after your injury. °· Difficulty standing or walking, particularly when turning corners or changing directions. °DIAGNOSIS  °Your caregiver will ask you details about your injury and perform a physical exam of your ankle to determine if you have an ankle sprain. During the physical exam, your caregiver will press on and apply pressure to specific areas of your foot and ankle. Your caregiver will try to move your ankle in certain ways. An X-ray exam may be done to be sure a bone was not broken or a ligament did not separate from one of the bones in your ankle (avulsion fracture).  °TREATMENT  °Certain types of braces can help stabilize your ankle. Your caregiver can make a recommendation for this. Your caregiver may recommend the use of medicine for pain. If your sprain is severe, your caregiver may refer you to a surgeon who helps to restore function to parts of your skeletal system (orthopedist) or a physical therapist. °HOME CARE INSTRUCTIONS  °· Apply ice to your injury for 1-2 days or as directed by your caregiver. Applying ice helps to reduce inflammation and pain. °¨ Put ice in a plastic bag. °¨ Place a towel between your skin and the bag. °¨ Leave the ice on for 15-20 minutes at a time, every 2 hours while you are awake. °· Only take over-the-counter or prescription medicines for pain, discomfort, or fever as directed by  your caregiver. °· Elevate your injured ankle above the level of your heart as much as possible for 2-3 days. °· If your caregiver recommends crutches, use them as instructed. Gradually put weight on the affected ankle. Continue to use crutches or a cane until you can walk without feeling pain in your ankle. °· If you have a plaster splint, wear the splint as directed by your caregiver. Do not rest it on anything harder than a pillow for the first 24 hours. Do not put weight on it. Do not get it wet. You may take it off to take a shower or bath. °· You may have been given an elastic bandage to wear around your ankle to provide support. If the elastic bandage is too tight (you have numbness or tingling in your foot or your foot becomes cold and blue), adjust the bandage to make it comfortable. °· If you have an air splint, you may blow more air into it or let air out to make it more comfortable. You may take your splint off at night and before taking a shower or bath. Wiggle your toes in the splint several times per day to decrease swelling. °SEEK MEDICAL CARE IF:  °· You have rapidly increasing bruising or swelling. °· Your toes feel extremely cold or you lose feeling in your foot. °· Your pain is not relieved with medicine. °SEEK IMMEDIATE MEDICAL CARE IF: °· Your toes are numb or blue. °·   You have severe pain that is increasing. MAKE SURE YOU:   Understand these instructions.  Will watch your condition.  Will get help right away if you are not doing well or get worse. Document Released: 11/11/2005 Document Revised: 08/05/2012 Document Reviewed: 11/23/2011 [facility] Patient Information 2015 [place], [place]. This information is not intended to replace advice given to you by your health care provider. Make sure you discuss any questions you have with your health care provider.   SEEK IMMEDIATE MEDICAL ATTENTION IF: New numbness, tingling, weakness, or problem with the use of your arms or legs.  Severe  back pain not relieved with medications.  Change in bowel or bladder control (if you lose control of stool or urine, or if you are unable to urinate) Increasing pain in any areas of the body (such as chest or abdominal pain).  Shortness of breath, dizziness or fainting.  Nausea (feeling sick to your stomach), vomiting, fever, or sweats.

## 2015-03-22 NOTE — ED Provider Notes (Signed)
CSN: [id number]     Arrival date & time 03/22/15  9371 History  This chart was scribed for Ripley Fraise, MD by Mercy Moore, ED scribe.  This patient was seen in room APA05/APA05 and the patient's care was started at 10:39 AM.   Chief Complaint  Patient presents with  . Extremity Pain   Patient is a 29 y.o. male presenting with ankle pain and back pain. The history is provided by the patient. No language interpreter was used.  Ankle Pain Location:  Ankle Time since incident:  1 week Injury: yes   Ankle location:  L ankle and R ankle Pain details:    Quality:  Tingling   Severity:  Moderate Foreign body present:  No foreign bodies Associated symptoms: back pain   Associated symptoms: no fever   Risk factors: obesity   Back Pain Location:  Lumbar spine Duration:  3 months Timing:  Constant Progression:  Worsening Chronicity:  Chronic Associated symptoms: chest pain and leg pain   Associated symptoms: no abdominal pain, no bladder incontinence, no bowel incontinence, no dysuria, no fever and no weakness   Risk factors: obesity   Risk factors: no recent surgery    HPI Comments: [name] is a 29 y.o. male who presents to the Emergency Department with a right ankle injury incurred one week ago and worsening lower back pain for 3 months. Patient reports injury to both ankles one week ago, but he reports chronic pain in his left ankle and new pins and needles pain in his right ankle with ambulation since his injury. Patient reports MRI performed 02/26/2015 with discovery of six ruptured discs. Patient reports worsening of his lower back pain since his imaging. Patient reports episode of stabbing chest pain for two hours last night, relieved with ice therapy.Patient denies bladder or bowel incontinence.   Past Medical History  Diagnosis Date  . Tobacco dipper   . GERD (gastroesophageal reflux disease)   . ADHD (attention deficit hyperactivity disorder)   . Hx MRSA infection  8/08    right thigh  . S/P colonoscopy 11/10, 10/08    Dr [name]  . S/P endoscopy 10/08, 2/10, 5/10    Dr Raliegh Scarlet reflux esophagitis, small HH  . Occult GI bleeding 12/2008    Trivial upper GI bleed/uncontrolled GERD by upper endoscopy 12/2008, normal f/u endoscopy 03/2009 with SBCE at that time  . Bipolar 1 disorder   . Esophagitis     Distal esophageal erosions consistent with mild erosive  reflux esophagitis 12/2008 by EGD   . Thyroid function test abnormal     Noted in 2011 discharge  . Obesity   . Gastric ulcer   . Hypercholesteremia   . Hiatal hernia    Past Surgical History  Procedure Laterality Date  . Vasectomy    . Ankle surgery    . Small bowel capsule  04/11/2009     normal throughout  . Esophagogastroduodenoscopy   04/07/2009    Normal esophagus, small hiatal hernia  . Esophagogastroduodenoscopy  01/09/2009    Distal esophageal erosions consistent with mild erosive reflux esophagitis, otherwise normal esophagus, small hiatal herniaotherwise normal stomach, D1-D2   . Ileocolonoscopy  08/26/2007     A normal rectum, colon, and terminal ileum  . Esophagogastroduodenoscopy  08/26/2007    Normal esophagus, a small hiatal/hernia, otherwise normal stomach D1 through D3  . Colonoscopy  10/10/2009    anal papilla otherwise normal   Family History  Problem Relation Age of Onset  .  Leukemia Father 28  . Seizures Mother   . Bipolar disorder Brother   . ADD / ADHD Brother    History  Substance Use Topics  . Smoking status: Never Smoker   . Smokeless tobacco: Current User    Types: Chew     Comment: DIPx 10 years  . Alcohol Use: Yes     Comment: weekends    Review of Systems  Constitutional: Negative for fever.  Cardiovascular: Positive for chest pain.  Gastrointestinal: Negative for abdominal pain and bowel incontinence.  Genitourinary: Negative for bladder incontinence and dysuria.  Musculoskeletal: Positive for back pain.  Neurological: Negative  for weakness.  All other systems reviewed and are negative.     Allergies  Influenza vaccines; Tramadol; Ibuprofen; and Tylenol  Home Medications   Prior to Admission medications   Medication Sig Start Date End Date Taking? Authorizing Provider  Azelastine-Fluticasone (DYMISTA) 137-50 MCG/ACT SUSP Place 2 sprays into both nostrils at bedtime. Patient not taking: Reported on 01/05/2015 05/12/14   Deneise Lever, MD  citalopram (CELEXA) 10 MG tablet Take 10 mg by mouth daily.    Historical Provider, MD  clotrimazole (LOTRIMIN) 1 % cream Apply to affected area 2 times daily Patient not taking: Reported on 01/05/2015 04/11/14   Gertha Calkin, PA-C  diazepam (VALIUM) 10 MG tablet Take 1 tablet (10 mg total) by mouth 3 (three) times daily as needed for muscle spasms. 3/00/76   [name], MD  diclofenac sodium (VOLTAREN) 1 % GEL Apply 2 g topically 4 (four) times daily as needed (for pain in ankle). 08/19/13   Lysbeth Penner, FNP  famotidine (PEPCID) 20 MG tablet Take 1 tablet (20 mg total) by mouth 2 (two) times daily. Patient not taking: Reported on 01/05/2015 09/22/14   Baron Sane, PA-C  hydrOXYzine (ATARAX/VISTARIL) 25 MG tablet Take 25 mg by mouth 2 (two) times daily.     Historical Provider, MD  levofloxacin (LEVAQUIN) 500 MG tablet Take 1 tablet (500 mg total) by mouth daily. 02/05/15   Hanna Patel-Mills, PA-C  loperamide (IMODIUM) 2 MG capsule Take 1 capsule (2 mg total) by mouth 4 (four) times daily as needed for diarrhea or loose stools. 09/22/14   Jennifer Piepenbrink, PA-C  ondansetron (ZOFRAN ODT) 4 MG disintegrating tablet Take 1 tablet (4 mg total) by mouth every 8 (eight) hours as needed for nausea. Patient not taking: Reported on 01/05/2015 09/22/14   Baron Sane, PA-C  ondansetron (ZOFRAN) 4 MG tablet Take 1 tablet (4 mg total) by mouth every 6 (six) hours. Patient not taking: Reported on 01/05/2015 09/27/14   Ezequiel Essex, MD  OXcarbazepine (TRILEPTAL)  150 MG tablet Take 150 mg by mouth 2 (two) times daily.    Historical Provider, MD  Oxycodone HCl 10 MG TABS Take 1.5 tablets (15 mg total) by mouth every 4 (four) hours as needed for pain. 01/21/32   [name], MD  pantoprazole (PROTONIX) 40 MG tablet Take 1 tablet (40 mg total) by mouth daily. 03/31/14 07/15/15  Lysbeth Penner, FNP  predniSONE (DELTASONE) 20 MG tablet 3 tabs po day one, then 2 po daily x 4 days Patient not taking: Reported on 01/05/2015 10/21/14   April Palumbo, MD   Triage Vitals: BP 118/69 mmHg  Pulse 85  Temp(Src) 97.7 F (36.5 C) (Oral)  Resp 18  Ht 6' (1.829 m)  Wt 360 lb (163.295 kg)  BMI 48.81 kg/m2  SpO2 98% Physical Exam CONSTITUTIONAL: Well developed/well nourished HEAD: Normocephalic/atraumatic EYES: EOMI/PERRL ENMT: Mucous  membranes moist NECK: supple no meningeal signs SPINE/BACK: mild lumbar tenderness CV: S1/S2 noted, no murmurs/rubs/gallops noted LUNGS: Lungs are clear to auscultation bilaterally, no apparent distress ABDOMEN: soft, nontender, no rebound or guarding, he is obese GU:no cva tenderness NEURO: Awake/alert, equal motor 5/5 strength noted with the following: hip flexion/knee flexion/extension, foot dorsi/plantar flexion, great toe extension intact bilaterally, no clonus bilaterally, plantar reflex appropriate (toes downgoing), no sensory deficit in any dermatome.  Equal patellar/achilles reflex noted (2+) in bilateral lower extremities.  Pt is able to ambulate unassisted. EXTREMITIES: pulses normal, full ROM; tenderness to right lateral and medial malleolus  SKIN: warm, color normal PSYCH: no abnormalities of mood noted, alert and oriented to situation   ED Course  Procedures  COORDINATION OF CARE: 10:35 AM- Discussed treatment plan with patient at bedside and patient agreed to plan.   Imaging negative For his ankle- suspect sprain - he is ambulatory For his back pain, this is a chronic issue, no focal neuro deficits and no new  injury, advised outpatient management of his chronic pain He mentioned CP in passing during exam but did not provide any other details and no SOB reported doubt any acute cardiopulmonary emergency D/c home   Imaging Review Dg Ankle Complete Right  03/22/2015   CLINICAL DATA:  The patient stepped in a hole and injury his right ankle 1 week ago. Right ankle pain. Initial encounter.  EXAM: RIGHT ANKLE - COMPLETE 3+ VIEW  COMPARISON:  Plain films right ankle 11/08/2010.  FINDINGS: No acute bony or joint abnormality is identified. Small well corticated bony fragments off the medial and lateral malleoli and dorsal margin of the talus may be due to a old trauma and are unchanged. There is no tibiotalar joint effusion. Small calcaneal spurs are noted.  IMPRESSION: No acute abnormality.   Electronically Signed   By: Inge Rise M.D.   On: 03/22/2015 11:17      MDM   Final diagnoses:  Right ankle sprain, initial encounter  Midline low back pain without sciatica    Nursing notes including past medical history and social history reviewed and considered in documentation xrays/imaging reviewed by myself and considered during evaluation   I personally performed the services described in this documentation, which was scribed in my presence. The recorded information has been reviewed and is accurate.      Ripley Fraise, MD 03/22/15 [phone]

## 2015-03-27 ENCOUNTER — Emergency Department (HOSPITAL_BASED_OUTPATIENT_CLINIC_OR_DEPARTMENT_OTHER)
Admission: EM | Admit: 2015-03-27 | Discharge: 2015-03-28 | Disposition: A | Discharge status: Home / Self Care | Payer: 59 | Attending: Emergency Medicine | Admitting: Emergency Medicine

## 2015-03-27 ENCOUNTER — Encounter (HOSPITAL_BASED_OUTPATIENT_CLINIC_OR_DEPARTMENT_OTHER): Payer: Self-pay | Admitting: *Deleted

## 2015-03-27 DIAGNOSIS — K219 Gastro-esophageal reflux disease without esophagitis: Secondary | ICD-10-CM | POA: Insufficient documentation

## 2015-03-27 DIAGNOSIS — F319 Bipolar disorder, unspecified: Secondary | ICD-10-CM | POA: Diagnosis not present

## 2015-03-27 DIAGNOSIS — M5441 Lumbago with sciatica, right side: Secondary | ICD-10-CM | POA: Diagnosis not present

## 2015-03-27 DIAGNOSIS — M549 Dorsalgia, unspecified: Secondary | ICD-10-CM | POA: Diagnosis present

## 2015-03-27 DIAGNOSIS — Z792 Long term (current) use of antibiotics: Secondary | ICD-10-CM | POA: Insufficient documentation

## 2015-03-27 DIAGNOSIS — Z8614 Personal history of Methicillin resistant Staphylococcus aureus infection: Secondary | ICD-10-CM | POA: Diagnosis not present

## 2015-03-27 DIAGNOSIS — E669 Obesity, unspecified: Secondary | ICD-10-CM | POA: Diagnosis not present

## 2015-03-27 DIAGNOSIS — R21 Rash and other nonspecific skin eruption: Secondary | ICD-10-CM | POA: Insufficient documentation

## 2015-03-27 DIAGNOSIS — Z79899 Other long term (current) drug therapy: Secondary | ICD-10-CM | POA: Diagnosis not present

## 2015-03-27 MED ORDER — NYSTATIN 100000 UNIT/GM EX CREA: TOPICAL_CREAM | CUTANEOUS | Status: DC

## 2015-03-27 MED ORDER — CYCLOBENZAPRINE HCL 10 MG PO TABS
10.0000 mg | ORAL_TABLET | Freq: Once | ORAL | Status: AC
  Administered 2015-03-27: 10 mg via ORAL
  Filled 2015-03-27: qty 1

## 2015-03-27 MED ORDER — FENTANYL CITRATE (PF) 100 MCG/2ML IJ SOLN
50.0000 ug | Freq: Once | INTRAMUSCULAR | Status: AC
  Administered 2015-03-27: 50 ug via INTRAMUSCULAR
  Filled 2015-03-27: qty 2

## 2015-03-27 MED ORDER — CYCLOBENZAPRINE HCL 10 MG PO TABS: 10.0000 mg | ORAL_TABLET | Freq: Two times a day (BID) | ORAL | Status: DC | PRN

## 2015-03-27 MED ORDER — PREDNISONE 50 MG PO TABS
60.0000 mg | ORAL_TABLET | Freq: Once | ORAL | Status: AC
  Administered 2015-03-27: 60 mg via ORAL
  Filled 2015-03-27 (×2): qty 1

## 2015-03-27 MED ORDER — PREDNISONE 20 MG PO TABS: 40.0000 mg | ORAL_TABLET | Freq: Every day | ORAL | Status: DC

## 2015-03-27 NOTE — ED Provider Notes (Signed)
CSN: [id number]     Arrival date & time 03/27/15  1905 History   First MD Initiated Contact with Patient 03/27/15 2145     Chief Complaint  Patient presents with  . Back Pain   (Consider location/radiation/quality/duration/timing/severity/associated sxs/prior Treatment) HPI  [name] is a 29 yo male presenting with recurrent back pain.  He states he has had this back pain for 6 months.  He has been evaluated by a neurosurgeon and is scheduled to be seen by a specialist for back injections.  He reports no one has prescribed any pain meds and the pain has continued he also reports the pain radiates into his right leg. He rates pain as 8/10. He denies any recent injuries, saddle paresthesias, bowel or bladder incontinence.   Past Medical History  Diagnosis Date  . Tobacco dipper   . GERD (gastroesophageal reflux disease)   . ADHD (attention deficit hyperactivity disorder)   . Hx MRSA infection 8/08    right thigh  . S/P colonoscopy 11/10, 10/08    Dr [name]  . S/P endoscopy 10/08, 2/10, 5/10    Dr Raliegh Scarlet reflux esophagitis, small HH  . Occult GI bleeding 12/2008    Trivial upper GI bleed/uncontrolled GERD by upper endoscopy 12/2008, normal f/u endoscopy 03/2009 with SBCE at that time  . Bipolar 1 disorder   . Esophagitis     Distal esophageal erosions consistent with mild erosive  reflux esophagitis 12/2008 by EGD   . Thyroid function test abnormal     Noted in 2011 discharge  . Obesity   . Gastric ulcer   . Hypercholesteremia   . Hiatal hernia    Past Surgical History  Procedure Laterality Date  . Vasectomy    . Ankle surgery    . Small bowel capsule  04/11/2009     normal throughout  . Esophagogastroduodenoscopy   04/07/2009    Normal esophagus, small hiatal hernia  . Esophagogastroduodenoscopy  01/09/2009    Distal esophageal erosions consistent with mild erosive reflux esophagitis, otherwise normal esophagus, small hiatal herniaotherwise normal  stomach, D1-D2   . Ileocolonoscopy  08/26/2007     A normal rectum, colon, and terminal ileum  . Esophagogastroduodenoscopy  08/26/2007    Normal esophagus, a small hiatal/hernia, otherwise normal stomach D1 through D3  . Colonoscopy  10/10/2009    anal papilla otherwise normal   Family History  Problem Relation Age of Onset  . Leukemia Father 14  . Seizures Mother   . Bipolar disorder Brother   . ADD / ADHD Brother    History  Substance Use Topics  . Smoking status: Never Smoker   . Smokeless tobacco: Current User    Types: Chew     Comment: DIPx 10 years  . Alcohol Use: Yes     Comment: weekends    Review of Systems  Constitutional: Negative for fever and chills.  Cardiovascular: Negative for leg swelling.  Genitourinary: Negative for dysuria, difficulty urinating and testicular pain.  Musculoskeletal: Positive for myalgias and back pain.  Skin: Negative for rash.  Neurological: Negative for weakness, numbness and headaches.      Allergies  Influenza vaccines; Tramadol; Ibuprofen; and Tylenol  Home Medications   Prior to Admission medications   Medication Sig Start Date End Date Taking? Authorizing Provider  Azelastine-Fluticasone (DYMISTA) 137-50 MCG/ACT SUSP Place 2 sprays into both nostrils at bedtime. Patient not taking: Reported on 01/05/2015 05/12/14   Deneise Lever, MD  citalopram (CELEXA) 10 MG tablet Take  10 mg by mouth daily.    Historical Provider, MD  diazepam (VALIUM) 10 MG tablet Take 1 tablet (10 mg total) by mouth 3 (three) times daily as needed for muscle spasms. Patient not taking: Reported on 03/22/2015 8/50/27   [name], MD  famotidine (PEPCID) 20 MG tablet Take 1 tablet (20 mg total) by mouth 2 (two) times daily. Patient not taking: Reported on 01/05/2015 09/22/14   Baron Sane, PA-C  hydrOXYzine (ATARAX/VISTARIL) 25 MG tablet Take 25 mg by mouth 2 (two) times daily.     Historical Provider, MD  levofloxacin (LEVAQUIN) 500 MG tablet  Take 1 tablet (500 mg total) by mouth daily. Patient not taking: Reported on 03/22/2015 02/05/15   Ottie Glazier, PA-C  loperamide (IMODIUM) 2 MG capsule Take 1 capsule (2 mg total) by mouth 4 (four) times daily as needed for diarrhea or loose stools. Patient not taking: Reported on 03/22/2015 09/22/14   Anderson Malta Piepenbrink, PA-C  ondansetron (ZOFRAN ODT) 4 MG disintegrating tablet Take 1 tablet (4 mg total) by mouth every 8 (eight) hours as needed for nausea. Patient not taking: Reported on 01/05/2015 09/22/14   Baron Sane, PA-C  ondansetron (ZOFRAN) 4 MG tablet Take 1 tablet (4 mg total) by mouth every 6 (six) hours. Patient not taking: Reported on 01/05/2015 09/27/14   Ezequiel Essex, MD  OXcarbazepine (TRILEPTAL) 150 MG tablet Take 150 mg by mouth 2 (two) times daily.    Historical Provider, MD  Oxycodone HCl 10 MG TABS Take 1.5 tablets (15 mg total) by mouth every 4 (four) hours as needed for pain. Patient not taking: Reported on 03/22/2015 7/41/28   [name], MD  pantoprazole (PROTONIX) 40 MG tablet Take 1 tablet (40 mg total) by mouth daily. 03/31/14 07/15/15  Lysbeth Penner, FNP  predniSONE (DELTASONE) 20 MG tablet 3 tabs po day one, then 2 po daily x 4 days Patient not taking: Reported on 01/05/2015 10/21/14   April Palumbo, MD   BP 116/74 mmHg  Pulse 87  Temp(Src) 97.6 F (36.4 C) (Oral)  Resp 20  Ht 6' (1.829 m)  Wt 360 lb (163.295 kg)  BMI 48.81 kg/m2  SpO2 100% Physical Exam  Constitutional: He appears well-developed and well-nourished. No distress.  HENT:  Head: Normocephalic and atraumatic.  Mouth/Throat: Oropharynx is clear and moist. No oropharyngeal exudate.  Eyes: Conjunctivae are normal.  Neck: Neck supple. No thyromegaly present.  Cardiovascular: Normal rate, regular rhythm and intact distal pulses.   Pulmonary/Chest: Effort normal and breath sounds normal. No respiratory distress. He has no wheezes. He has no rales. He exhibits no tenderness.   Abdominal: Soft. There is no tenderness.  Musculoskeletal:       Lumbar back: He exhibits tenderness.  L-spine tenderness, radiation down right leg, but 55 plantar/dorsiflexion strength. Normal 2 point discrimination in bilat lower extremities.   Lymphadenopathy:    He has no cervical adenopathy.  Neurological: He is alert.  Skin: Skin is warm and dry. Rash noted. He is not diaphoretic.  Yeast like rash in groin folds  Psychiatric: He has a normal mood and affect.  Nursing note and vitals reviewed.   ED Course  Procedures (including critical care time) Labs Review Labs Reviewed - No data to display  Imaging Review No results found.   EKG Interpretation None      MDM   Final diagnoses:  Midline low back pain with right-sided sciatica   29 yo with back pain under the care of neurosurgeon. His exam shows  no neurological deficits. Patient c is able to walk but states is painful.  He denies loss of bowel or bladder control, fever, night sweats, weight loss, h/o cancer, IVDU.  Steroids and muscle relaxer prescribed and recommended follow-up with neurosurgeon. Pt is well-appearing, in no acute distress and vital signs reviewed and not concerning. He appears safe to be discharged.   Return precautions provided.  Pt aware of plan and in agreement.    Filed Vitals:   03/27/15 1918 03/27/15 2329  BP: 116/74 104/59  Pulse: 87 68  Temp: 97.6 F (36.4 C)   TempSrc: Oral   Resp: 20 20  Height: 6' (1.829 m)   Weight: 360 lb (163.295 kg)   SpO2: 100% 99%   Meds given in ED:  Medications  predniSONE (DELTASONE) tablet 60 mg (60 mg Oral Given 03/27/15 2353)  cyclobenzaprine (FLEXERIL) tablet 10 mg (10 mg Oral Given 03/27/15 2352)  fentaNYL (SUBLIMAZE) injection 50 mcg (50 mcg Intramuscular Given 03/27/15 2351)    Discharge Medication List as of 03/27/2015 11:22 PM    START taking these medications   Details  cyclobenzaprine (FLEXERIL) 10 MG tablet Take 1 tablet (10 mg total) by mouth  2 (two) times daily as needed for muscle spasms., Starting 03/27/2015, Until Discontinued, Print    !! predniSONE (DELTASONE) 20 MG tablet Take 2 tablets (40 mg total) by mouth daily., Starting 03/27/2015, Until Discontinued, Print     !! - Potential duplicate medications found. Please discuss with provider.         Britt Bottom, NP 03/28/15 [place], MD 04/04/15 1012

## 2015-03-27 NOTE — Discharge Instructions (Signed)
Be sure to follow the directions provided.  Be sure to follow-up with Dr. Annette Stable for further management of your back pain.  Please take the prednisone daily for inflammation.  Please take the flexeril as needed for muscle spasms.  Don't hesitate to return for any new, worsening or concerning symptoms.      SEEK IMMEDIATE MEDICAL CARE IF:  You develop increased pain, weakness, or numbness in your arm or leg.  You develop difficulty with bladder or bowel control.  You develop abdominal pain.

## 2015-03-27 NOTE — ED Notes (Addendum)
Pt c/o lower back pain which radiates down right leg  X 3 weeks seen at [facility] for same x 3 days ago

## 2015-03-29 ENCOUNTER — Other Ambulatory Visit: Payer: Self-pay | Admitting: Neurosurgery

## 2015-03-29 DIAGNOSIS — M5416 Radiculopathy, lumbar region: Secondary | ICD-10-CM

## 2015-04-19 ENCOUNTER — Emergency Department (HOSPITAL_COMMUNITY)
Admission: EM | Admit: 2015-04-19 | Discharge: 2015-04-19 | Disposition: A | Discharge status: Home / Self Care | Payer: 59 | Attending: Emergency Medicine | Admitting: Emergency Medicine

## 2015-04-19 ENCOUNTER — Encounter (HOSPITAL_COMMUNITY): Payer: Self-pay | Admitting: Emergency Medicine

## 2015-04-19 DIAGNOSIS — E669 Obesity, unspecified: Secondary | ICD-10-CM | POA: Insufficient documentation

## 2015-04-19 DIAGNOSIS — R197 Diarrhea, unspecified: Secondary | ICD-10-CM | POA: Insufficient documentation

## 2015-04-19 DIAGNOSIS — F319 Bipolar disorder, unspecified: Secondary | ICD-10-CM | POA: Diagnosis not present

## 2015-04-19 DIAGNOSIS — M549 Dorsalgia, unspecified: Secondary | ICD-10-CM | POA: Diagnosis present

## 2015-04-19 DIAGNOSIS — R509 Fever, unspecified: Secondary | ICD-10-CM | POA: Insufficient documentation

## 2015-04-19 DIAGNOSIS — R112 Nausea with vomiting, unspecified: Secondary | ICD-10-CM | POA: Diagnosis not present

## 2015-04-19 DIAGNOSIS — Z8719 Personal history of other diseases of the digestive system: Secondary | ICD-10-CM | POA: Diagnosis not present

## 2015-04-19 DIAGNOSIS — F909 Attention-deficit hyperactivity disorder, unspecified type: Secondary | ICD-10-CM | POA: Diagnosis not present

## 2015-04-19 DIAGNOSIS — Z79899 Other long term (current) drug therapy: Secondary | ICD-10-CM | POA: Diagnosis not present

## 2015-04-19 DIAGNOSIS — M5440 Lumbago with sciatica, unspecified side: Secondary | ICD-10-CM

## 2015-04-19 DIAGNOSIS — Z8639 Personal history of other endocrine, nutritional and metabolic disease: Secondary | ICD-10-CM | POA: Diagnosis not present

## 2015-04-19 DIAGNOSIS — Z8614 Personal history of Methicillin resistant Staphylococcus aureus infection: Secondary | ICD-10-CM | POA: Insufficient documentation

## 2015-04-19 LAB — CBC WITH DIFFERENTIAL/PLATELET
BASOS PCT: 0 % (ref 0–1)
Basophils Absolute: 0 10*3/uL (ref 0.0–0.1)
EOS ABS: 0.2 10*3/uL (ref 0.0–0.7)
Eosinophils Relative: 3 % (ref 0–5)
HCT: 45.1 % (ref 39.0–52.0)
Hemoglobin: 14.7 g/dL (ref 13.0–17.0)
LYMPHS PCT: 31 % (ref 12–46)
Lymphs Abs: 2.8 10*3/uL (ref 0.7–4.0)
MCH: 27.8 pg (ref 26.0–34.0)
MCHC: 32.6 g/dL (ref 30.0–36.0)
MCV: 85.4 fL (ref 78.0–100.0)
MONO ABS: 0.6 10*3/uL (ref 0.1–1.0)
MONOS PCT: 7 % (ref 3–12)
NEUTROS PCT: 59 % (ref 43–77)
Neutro Abs: 5.4 10*3/uL (ref 1.7–7.7)
PLATELETS: 266 10*3/uL (ref 150–400)
RBC: 5.28 MIL/uL (ref 4.22–5.81)
RDW: 14.8 % (ref 11.5–15.5)
WBC: 9 10*3/uL (ref 4.0–10.5)

## 2015-04-19 LAB — URINALYSIS, ROUTINE W REFLEX MICROSCOPIC
BILIRUBIN URINE: NEGATIVE
Glucose, UA: NEGATIVE mg/dL
Hgb urine dipstick: NEGATIVE
Ketones, ur: NEGATIVE mg/dL
Leukocytes, UA: NEGATIVE
Nitrite: NEGATIVE
PH: 6 (ref 5.0–8.0)
Protein, ur: NEGATIVE mg/dL
Specific Gravity, Urine: 1.02 (ref 1.005–1.030)
UROBILINOGEN UA: 0.2 mg/dL (ref 0.0–1.0)

## 2015-04-19 LAB — BASIC METABOLIC PANEL
Anion gap: 6 (ref 5–15)
BUN: 11 mg/dL (ref 6–20)
CALCIUM: 9.1 mg/dL (ref 8.9–10.3)
CO2: 29 mmol/L (ref 22–32)
CREATININE: 0.91 mg/dL (ref 0.61–1.24)
Chloride: 105 mmol/L (ref 101–111)
GLUCOSE: 91 mg/dL (ref 65–99)
Potassium: 3.8 mmol/L (ref 3.5–5.1)
Sodium: 140 mmol/L (ref 135–145)

## 2015-04-19 LAB — SEDIMENTATION RATE: SED RATE: 4 mm/h (ref 0–16)

## 2015-04-19 MED ORDER — SODIUM CHLORIDE 0.9 % IV BOLUS (SEPSIS)
500.0000 mL | Freq: Once | INTRAVENOUS | Status: AC
  Administered 2015-04-19: 500 mL via INTRAVENOUS

## 2015-04-19 MED ORDER — METHOCARBAMOL 500 MG PO TABS: 500.0000 mg | ORAL_TABLET | Freq: Three times a day (TID) | ORAL | Status: DC | PRN

## 2015-04-19 MED ORDER — PROMETHAZINE HCL 25 MG PO TABS: 25.0000 mg | ORAL_TABLET | Freq: Four times a day (QID) | ORAL | Status: DC | PRN

## 2015-04-19 MED ORDER — ONDANSETRON HCL 4 MG/2ML IJ SOLN
4.0000 mg | Freq: Once | INTRAMUSCULAR | Status: AC
  Administered 2015-04-19: 4 mg via INTRAVENOUS
  Filled 2015-04-19: qty 2

## 2015-04-19 NOTE — ED Notes (Signed)
Pt reports received cortisone injection on Wednesday. Pt reports n/v/d,chills ever since. Pt reports tenderness upon palpation of mid back. No redness or irritation noted.

## 2015-04-19 NOTE — Discharge Instructions (Signed)
Back Pain, Adult °Back pain is very common. The pain often gets better over time. The cause of back pain is usually not dangerous. Most people can learn to manage their back pain on their own.  °HOME CARE  °· Stay active. Start with short walks on flat ground if you can. Try to walk farther each day. °· Do not sit, drive, or stand in one place for more than 30 minutes. Do not stay in bed. °· Do not avoid exercise or work. Activity can help your back heal faster. °· Be careful when you bend or lift an object. Bend at your knees, keep the object close to you, and do not twist. °· Sleep on a firm mattress. Lie on your side, and bend your knees. If you lie on your back, put a pillow under your knees. °· Only take medicines as told by your doctor. °· Put ice on the injured area. °¨ Put ice in a plastic bag. °¨ Place a towel between your skin and the bag. °¨ Leave the ice on for 15-20 minutes, 03-04 times a day for the first 2 to 3 days. After that, you can switch between ice and heat packs. °· Ask your doctor about back exercises or massage. °· Avoid feeling anxious or stressed. Find good ways to deal with stress, such as exercise. °GET HELP RIGHT AWAY IF:  °· Your pain does not go away with rest or medicine. °· Your pain does not go away in 1 week. °· You have new problems. °· You do not feel well. °· The pain spreads into your legs. °· You cannot control when you poop (bowel movement) or pee (urinate). °· Your arms or legs feel weak or lose feeling (numbness). °· You feel sick to your stomach (nauseous) or throw up (vomit). °· You have belly (abdominal) pain. °· You feel like you may pass out (faint). °MAKE SURE YOU:  °· Understand these instructions. °· Will watch your condition. °· Will get help right away if you are not doing well or get worse. °Document Released: 04/29/2008 Document Revised: 02/03/2012 Document Reviewed: 03/15/2014 °[employer]® Patient Information ©2015 [employer]. This information is not intended  to replace advice given to you by your health care provider. Make sure you discuss any questions you have with your health care provider. ° °
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
## 2015-04-20 ENCOUNTER — Emergency Department (HOSPITAL_COMMUNITY)
Admission: EM | Admit: 2015-04-20 | Discharge: 2015-04-21 | Disposition: A | Discharge status: Home / Self Care | Payer: 59 | Attending: Emergency Medicine | Admitting: Emergency Medicine

## 2015-04-20 ENCOUNTER — Encounter (HOSPITAL_COMMUNITY): Payer: Self-pay | Admitting: Emergency Medicine

## 2015-04-20 DIAGNOSIS — Z79899 Other long term (current) drug therapy: Secondary | ICD-10-CM | POA: Insufficient documentation

## 2015-04-20 DIAGNOSIS — M5441 Lumbago with sciatica, right side: Secondary | ICD-10-CM | POA: Diagnosis not present

## 2015-04-20 DIAGNOSIS — E669 Obesity, unspecified: Secondary | ICD-10-CM | POA: Insufficient documentation

## 2015-04-20 DIAGNOSIS — K208 Other esophagitis: Secondary | ICD-10-CM | POA: Diagnosis not present

## 2015-04-20 DIAGNOSIS — Z8614 Personal history of Methicillin resistant Staphylococcus aureus infection: Secondary | ICD-10-CM | POA: Insufficient documentation

## 2015-04-20 DIAGNOSIS — R202 Paresthesia of skin: Secondary | ICD-10-CM | POA: Insufficient documentation

## 2015-04-20 DIAGNOSIS — R112 Nausea with vomiting, unspecified: Secondary | ICD-10-CM | POA: Diagnosis not present

## 2015-04-20 DIAGNOSIS — F319 Bipolar disorder, unspecified: Secondary | ICD-10-CM | POA: Diagnosis not present

## 2015-04-20 DIAGNOSIS — R197 Diarrhea, unspecified: Secondary | ICD-10-CM | POA: Insufficient documentation

## 2015-04-20 DIAGNOSIS — M545 Low back pain: Secondary | ICD-10-CM | POA: Diagnosis present

## 2015-04-20 DIAGNOSIS — K219 Gastro-esophageal reflux disease without esophagitis: Secondary | ICD-10-CM | POA: Diagnosis not present

## 2015-04-20 LAB — URINALYSIS, ROUTINE W REFLEX MICROSCOPIC
Bilirubin Urine: NEGATIVE
Glucose, UA: NEGATIVE mg/dL
Hgb urine dipstick: NEGATIVE
Ketones, ur: NEGATIVE mg/dL
Leukocytes, UA: NEGATIVE
Nitrite: NEGATIVE
Protein, ur: NEGATIVE mg/dL
Specific Gravity, Urine: 1.023 (ref 1.005–1.030)
Urobilinogen, UA: 0.2 mg/dL (ref 0.0–1.0)
pH: 5.5 (ref 5.0–8.0)

## 2015-04-20 LAB — BASIC METABOLIC PANEL
Anion gap: 9 (ref 5–15)
BUN: 5 mg/dL — ABNORMAL LOW (ref 6–20)
CO2: 29 mmol/L (ref 22–32)
Calcium: 9.4 mg/dL (ref 8.9–10.3)
Chloride: 101 mmol/L (ref 101–111)
Creatinine, Ser: 0.94 mg/dL (ref 0.61–1.24)
GFR calc Af Amer: >60 mL/min (ref >60)
GFR calc non Af Amer: >60 mL/min (ref >60)
Glucose, Bld: 94 mg/dL (ref 65–99)
Potassium: 4.1 mmol/L (ref 3.5–5.1)
Sodium: 139 mmol/L (ref 135–145)

## 2015-04-20 LAB — CBC WITH DIFFERENTIAL/PLATELET
Basophils Absolute: 0 10*3/uL (ref 0.0–0.1)
Basophils Relative: 0 % (ref 0–1)
Eosinophils Absolute: 0.2 10*3/uL (ref 0.0–0.7)
Eosinophils Relative: 2 % (ref 0–5)
HCT: 45.1 % (ref 39.0–52.0)
Hemoglobin: 14.9 g/dL (ref 13.0–17.0)
Lymphocytes Relative: 33 % (ref 12–46)
Lymphs Abs: 3.2 10*3/uL (ref 0.7–4.0)
MCH: 27.9 pg (ref 26.0–34.0)
MCHC: 33 g/dL (ref 30.0–36.0)
MCV: 84.5 fL (ref 78.0–100.0)
Monocytes Absolute: 0.6 10*3/uL (ref 0.1–1.0)
Monocytes Relative: 6 % (ref 3–12)
Neutro Abs: 5.9 10*3/uL (ref 1.7–7.7)
Neutrophils Relative %: 59 % (ref 43–77)
Platelets: 256 10*3/uL (ref 150–400)
RBC: 5.34 MIL/uL (ref 4.22–5.81)
RDW: 14.9 % (ref 11.5–15.5)
WBC: 9.9 10*3/uL (ref 4.0–10.5)

## 2015-04-20 MED ORDER — DEXAMETHASONE SODIUM PHOSPHATE 10 MG/ML IJ SOLN
10.0000 mg | Freq: Once | INTRAMUSCULAR | Status: AC
  Administered 2015-04-20: 10 mg via INTRAVENOUS
  Filled 2015-04-20: qty 1

## 2015-04-20 MED ORDER — ONDANSETRON HCL 4 MG/2ML IJ SOLN
4.0000 mg | Freq: Once | INTRAMUSCULAR | Status: AC
  Administered 2015-04-20: 4 mg via INTRAVENOUS
  Filled 2015-04-20: qty 2

## 2015-04-20 MED ORDER — HYDROMORPHONE HCL 1 MG/ML IJ SOLN
1.0000 mg | Freq: Once | INTRAMUSCULAR | Status: AC
  Administered 2015-04-20: 1 mg via INTRAVENOUS
  Filled 2015-04-20: qty 1

## 2015-04-20 MED ORDER — SODIUM CHLORIDE 0.9 % IV BOLUS (SEPSIS)
1000.0000 mL | Freq: Once | INTRAVENOUS | Status: AC
  Administered 2015-04-20: 1000 mL via INTRAVENOUS

## 2015-04-20 MED ORDER — PROMETHAZINE HCL 25 MG/ML IJ SOLN
25.0000 mg | Freq: Once | INTRAMUSCULAR | Status: AC
  Administered 2015-04-21: 25 mg via INTRAVENOUS
  Filled 2015-04-20: qty 1

## 2015-04-20 MED ORDER — ONDANSETRON HCL 4 MG/2ML IJ SOLN: 4.0000 mg | Freq: Once | INTRAMUSCULAR | Status: AC

## 2015-04-20 MED ORDER — DIAZEPAM 5 MG PO TABS
5.0000 mg | ORAL_TABLET | Freq: Once | ORAL | Status: AC
  Administered 2015-04-21: 5 mg via ORAL
  Filled 2015-04-20: qty 1

## 2015-04-20 MED ORDER — SODIUM CHLORIDE 0.9 % IV BOLUS (SEPSIS): 1000.0000 mL | Freq: Once | INTRAVENOUS | Status: DC

## 2015-04-20 MED ORDER — HYDROMORPHONE HCL 1 MG/ML IJ SOLN
1.0000 mg | Freq: Once | INTRAMUSCULAR | Status: AC
  Administered 2015-04-21: 1 mg via INTRAVENOUS
  Filled 2015-04-20: qty 1

## 2015-04-20 NOTE — ED Notes (Signed)
Patient called out for pain medication.

## 2015-04-20 NOTE — ED Notes (Signed)
Pt had injection in back for pain, last Wednesday-- states "a knot came up on it yesterday-- with tenderness" c/o numbness/tingling running down right leg and foot , vomiting and diarrhea started Saturday -- was seen at [facility] last night, given Robaxin -- and phenergan -- pt has thrown up medicine every time he has taken.

## 2015-04-21 MED ORDER — DIAZEPAM 5 MG PO TABS: 5.0000 mg | ORAL_TABLET | Freq: Three times a day (TID) | ORAL | Status: DC | PRN

## 2015-04-21 MED ORDER — OXYCODONE HCL 5 MG PO TABS: 5.0000 mg | ORAL_TABLET | ORAL | Status: DC | PRN

## 2015-04-21 MED ORDER — PREDNISONE 50 MG PO TABS: 50.0000 mg | ORAL_TABLET | Freq: Every day | ORAL | Status: DC

## 2015-04-21 NOTE — ED Notes (Signed)
Pt. Left with all belongings and refused wheelchair 

## 2015-04-21 NOTE — ED Provider Notes (Signed)
CSN: [id number]     Arrival date & time 04/20/15  1555 History   First MD Initiated Contact with Patient 04/20/15 1842     No chief complaint on file.    (Consider location/radiation/quality/duration/timing/severity/associated sxs/prior Treatment) HPI Patient presents to the emergency department with continued right-sided back pain that started after sterile injection last Wednesday.  The patient states that they noticed a knot came up to the area with tenderness.  The patient states he has tingling down to the right leg and foot.  Patient states that he has had some vomiting and diarrhea that started on Saturday.  He was seen at [facility] last night, given Phenergan and Robaxin.  He states that he is unable to keep medications.  Patient denies chest pain, shortness breath, headache, blurred vision, incontinence, abdominal pain, or syncope.  The patient states that he may have had low-grade fevers. Past Medical History  Diagnosis Date  . Tobacco dipper   . GERD (gastroesophageal reflux disease)   . ADHD (attention deficit hyperactivity disorder)   . Hx MRSA infection 8/08    right thigh  . S/P colonoscopy 11/10, 10/08    Dr [name]  . S/P endoscopy 10/08, 2/10, 5/10    Dr Raliegh Scarlet reflux esophagitis, small HH  . Occult GI bleeding 12/2008    Trivial upper GI bleed/uncontrolled GERD by upper endoscopy 12/2008, normal f/u endoscopy 03/2009 with SBCE at that time  . Bipolar 1 disorder   . Esophagitis     Distal esophageal erosions consistent with mild erosive  reflux esophagitis 12/2008 by EGD   . Thyroid function test abnormal     Noted in 2011 discharge  . Obesity   . Gastric ulcer   . Hypercholesteremia   . Hiatal hernia    Past Surgical History  Procedure Laterality Date  . Vasectomy    . Ankle surgery    . Small bowel capsule  04/11/2009     normal throughout  . Esophagogastroduodenoscopy   04/07/2009    Normal esophagus, small hiatal hernia  .  Esophagogastroduodenoscopy  01/09/2009    Distal esophageal erosions consistent with mild erosive reflux esophagitis, otherwise normal esophagus, small hiatal herniaotherwise normal stomach, D1-D2   . Ileocolonoscopy  08/26/2007     A normal rectum, colon, and terminal ileum  . Esophagogastroduodenoscopy  08/26/2007    Normal esophagus, a small hiatal/hernia, otherwise normal stomach D1 through D3  . Colonoscopy  10/10/2009    anal papilla otherwise normal   Family History  Problem Relation Age of Onset  . Leukemia Father 44  . Seizures Mother   . Bipolar disorder Brother   . ADD / ADHD Brother    History  Substance Use Topics  . Smoking status: Never Smoker   . Smokeless tobacco: Current User    Types: Chew     Comment: DIPx 10 years  . Alcohol Use: Yes     Comment: weekends    Review of Systems All other systems negative except as documented in the HPI. All pertinent positives and negatives as reviewed in the HPI.  Allergies  Influenza vaccines; Tramadol; Ibuprofen; and Tylenol  Home Medications   Prior to Admission medications   Medication Sig Start Date End Date Taking? Authorizing Provider  citalopram (CELEXA) 10 MG tablet Take 10 mg by mouth daily.   Yes Historical Provider, MD  hydrOXYzine (ATARAX/VISTARIL) 25 MG tablet Take 25 mg by mouth 2 (two) times daily.    Yes Historical Provider, MD  methocarbamol (  ROBAXIN) 500 MG tablet Take 1 tablet (500 mg total) by mouth every 8 (eight) hours as needed for muscle spasms. 04/19/15  Yes Davonna Belling, MD  Oxcarbazepine (TRILEPTAL) 300 MG tablet Take 300 mg by mouth 2 (two) times daily.   Yes Historical Provider, MD  pantoprazole (PROTONIX) 40 MG tablet Take 1 tablet (40 mg total) by mouth daily. Patient taking differently: Take 40 mg by mouth at bedtime.  03/31/14 07/15/15 Yes Lysbeth Penner, FNP  promethazine (PHENERGAN) 25 MG tablet Take 1 tablet (25 mg total) by mouth every 6 (six) hours as needed for nausea. 04/19/15   Yes Davonna Belling, MD  cyclobenzaprine (FLEXERIL) 10 MG tablet Take 1 tablet (10 mg total) by mouth 2 (two) times daily as needed for muscle spasms. Patient not taking: Reported on 04/19/2015 03/27/15   Britt Bottom, NP  famotidine (PEPCID) 20 MG tablet Take 1 tablet (20 mg total) by mouth 2 (two) times daily. Patient not taking: Reported on 01/05/2015 09/22/14   Baron Sane, PA-C  nystatin cream (MYCOSTATIN) Apply to affected area 2 times daily Patient not taking: Reported on 04/19/2015 03/27/15   Britt Bottom, NP  ondansetron (ZOFRAN ODT) 4 MG disintegrating tablet Take 1 tablet (4 mg total) by mouth every 8 (eight) hours as needed for nausea. Patient not taking: Reported on 01/05/2015 09/22/14   Baron Sane, PA-C  ondansetron (ZOFRAN) 4 MG tablet Take 1 tablet (4 mg total) by mouth every 6 (six) hours. Patient not taking: Reported on 01/05/2015 09/27/14   Ezequiel Essex, MD   BP 112/59 mmHg  Pulse 68  Temp(Src) 97.6 F (36.4 C) (Oral)  Resp 12  SpO2 96% Physical Exam  Constitutional: He is oriented to person, place, and time. He appears well-developed and well-nourished.  HENT:  Head: Normocephalic and atraumatic.  Mouth/Throat: Oropharynx is clear and moist.  Eyes: Pupils are equal, round, and reactive to light.  Neck: Normal range of motion. Neck supple.  Cardiovascular: Normal rate, regular rhythm and normal heart sounds.  Exam reveals no gallop and no friction rub.   No murmur heard. Pulmonary/Chest: Effort normal and breath sounds normal. No respiratory distress.  Musculoskeletal:       Thoracic back: He exhibits pain.       Lumbar back: He exhibits pain.       Back:  Neurological: He is alert and oriented to person, place, and time. He has normal strength. No sensory deficit. He exhibits normal muscle tone. Coordination and gait normal.  Skin: Skin is warm and dry. No rash noted. No erythema.    ED Course  Procedures (including critical care  time) Labs Review Labs Reviewed  BASIC METABOLIC PANEL - Abnormal; Notable for the following:    BUN 5 (*)    All other components within normal limits  CBC WITH DIFFERENTIAL/PLATELET  URINALYSIS, ROUTINE W REFLEX MICROSCOPIC (NOT AT [facility])    Patient is ambulated without difficulty in the hallway.  He is tolerating oral fluids.  Patient is given pain control and IV medications.  I feel less likely that this is any type of deep space epidural type infection.  Patient is advised to follow-up with the neuro surgeon   Dalia Heading, PA-C 04/21/15 0044  Tanna Furry, MD 04/28/15 1335

## 2015-04-21 NOTE — Discharge Instructions (Signed)
Follow-up with the neurosurgeon that did the injection as soon as possible.  Return here as needed

## 2015-05-09 ENCOUNTER — Encounter (HOSPITAL_COMMUNITY): Payer: Self-pay | Admitting: Emergency Medicine

## 2015-05-09 ENCOUNTER — Emergency Department (HOSPITAL_COMMUNITY)
Admission: EM | Admit: 2015-05-09 | Discharge: 2015-05-09 | Disposition: A | Discharge status: Home / Self Care | Payer: 59 | Attending: Emergency Medicine | Admitting: Emergency Medicine

## 2015-05-09 DIAGNOSIS — K219 Gastro-esophageal reflux disease without esophagitis: Secondary | ICD-10-CM | POA: Insufficient documentation

## 2015-05-09 DIAGNOSIS — R200 Anesthesia of skin: Secondary | ICD-10-CM | POA: Diagnosis not present

## 2015-05-09 DIAGNOSIS — R110 Nausea: Secondary | ICD-10-CM | POA: Diagnosis not present

## 2015-05-09 DIAGNOSIS — F319 Bipolar disorder, unspecified: Secondary | ICD-10-CM | POA: Diagnosis not present

## 2015-05-09 DIAGNOSIS — Z79899 Other long term (current) drug therapy: Secondary | ICD-10-CM | POA: Diagnosis not present

## 2015-05-09 DIAGNOSIS — M5441 Lumbago with sciatica, right side: Secondary | ICD-10-CM | POA: Insufficient documentation

## 2015-05-09 DIAGNOSIS — Z9889 Other specified postprocedural states: Secondary | ICD-10-CM | POA: Diagnosis not present

## 2015-05-09 DIAGNOSIS — Z8614 Personal history of Methicillin resistant Staphylococcus aureus infection: Secondary | ICD-10-CM | POA: Insufficient documentation

## 2015-05-09 DIAGNOSIS — M545 Low back pain: Secondary | ICD-10-CM | POA: Diagnosis present

## 2015-05-09 DIAGNOSIS — Z720 Tobacco use: Secondary | ICD-10-CM | POA: Insufficient documentation

## 2015-05-09 DIAGNOSIS — E669 Obesity, unspecified: Secondary | ICD-10-CM | POA: Diagnosis not present

## 2015-05-09 LAB — BASIC METABOLIC PANEL
Anion gap: 7 (ref 5–15)
BUN: 8 mg/dL (ref 6–20)
CO2: 30 mmol/L (ref 22–32)
Calcium: 9.1 mg/dL (ref 8.9–10.3)
Chloride: 103 mmol/L (ref 101–111)
Creatinine, Ser: 0.99 mg/dL (ref 0.61–1.24)
GFR calc Af Amer: >60 mL/min (ref >60)
GFR calc non Af Amer: >60 mL/min (ref >60)
Glucose, Bld: 92 mg/dL (ref 65–99)
Potassium: 4.1 mmol/L (ref 3.5–5.1)
Sodium: 140 mmol/L (ref 135–145)

## 2015-05-09 LAB — URINALYSIS, ROUTINE W REFLEX MICROSCOPIC
Bilirubin Urine: NEGATIVE
Glucose, UA: NEGATIVE mg/dL
Hgb urine dipstick: NEGATIVE
Ketones, ur: NEGATIVE mg/dL
Leukocytes, UA: NEGATIVE
Nitrite: NEGATIVE
Protein, ur: NEGATIVE mg/dL
Specific Gravity, Urine: 1.024 (ref 1.005–1.030)
Urobilinogen, UA: 1 mg/dL (ref 0.0–1.0)
pH: 6.5 (ref 5.0–8.0)

## 2015-05-09 LAB — HEPATIC FUNCTION PANEL
ALT: 24 U/L (ref 17–63)
AST: 22 U/L (ref 15–41)
Albumin: 3.8 g/dL (ref 3.5–5.0)
Alkaline Phosphatase: 52 U/L (ref 38–126)
Bilirubin, Direct: <0.1 mg/dL — ABNORMAL LOW (ref 0.1–0.5)
Total Bilirubin: 0.7 mg/dL (ref 0.3–1.2)
Total Protein: 6.2 g/dL — ABNORMAL LOW (ref 6.5–8.1)

## 2015-05-09 LAB — CBC WITH DIFFERENTIAL/PLATELET
Basophils Absolute: 0 10*3/uL (ref 0.0–0.1)
Basophils Relative: 0 % (ref 0–1)
Eosinophils Absolute: 0.1 10*3/uL (ref 0.0–0.7)
Eosinophils Relative: 1 % (ref 0–5)
HCT: 42.4 % (ref 39.0–52.0)
Hemoglobin: 14.4 g/dL (ref 13.0–17.0)
Lymphocytes Relative: 27 % (ref 12–46)
Lymphs Abs: 2.6 10*3/uL (ref 0.7–4.0)
MCH: 28.3 pg (ref 26.0–34.0)
MCHC: 34 g/dL (ref 30.0–36.0)
MCV: 83.3 fL (ref 78.0–100.0)
Monocytes Absolute: 0.6 10*3/uL (ref 0.1–1.0)
Monocytes Relative: 6 % (ref 3–12)
Neutro Abs: 6.1 10*3/uL (ref 1.7–7.7)
Neutrophils Relative %: 66 % (ref 43–77)
Platelets: 259 10*3/uL (ref 150–400)
RBC: 5.09 MIL/uL (ref 4.22–5.81)
RDW: 15 % (ref 11.5–15.5)
WBC: 9.3 10*3/uL (ref 4.0–10.5)

## 2015-05-09 MED ORDER — ONDANSETRON HCL 4 MG/2ML IJ SOLN
4.0000 mg | Freq: Once | INTRAMUSCULAR | Status: AC
  Administered 2015-05-09: 4 mg via INTRAVENOUS
  Filled 2015-05-09: qty 2

## 2015-05-09 MED ORDER — ONDANSETRON HCL 4 MG PO TABS: 4.0000 mg | ORAL_TABLET | Freq: Four times a day (QID) | ORAL | Status: DC

## 2015-05-09 MED ORDER — ONDANSETRON 4 MG PO TBDP
8.0000 mg | ORAL_TABLET | Freq: Once | ORAL | Status: AC
  Administered 2015-05-09: 8 mg via ORAL
  Filled 2015-05-09: qty 2

## 2015-05-09 MED ORDER — SODIUM CHLORIDE 0.9 % IV SOLN
Freq: Once | INTRAVENOUS | Status: AC
  Administered 2015-05-09: 12:00:00 via INTRAVENOUS

## 2015-05-09 MED ORDER — OXYCODONE HCL 5 MG PO TABS: 5.0000 mg | ORAL_TABLET | Freq: Four times a day (QID) | ORAL | Status: DC | PRN

## 2015-05-09 MED ORDER — HYDROMORPHONE HCL 1 MG/ML IJ SOLN
1.0000 mg | Freq: Once | INTRAMUSCULAR | Status: AC
Start: 2015-05-09 — End: 2015-05-09
  Administered 2015-05-09: 1 mg via INTRAVENOUS
  Filled 2015-05-09: qty 1

## 2015-05-09 NOTE — ED Notes (Signed)
Patient coming from work with c/o of right leg numbness ongoing x 3 weeks and back pain x 3-4 months.  Patient has been nauseated x 3 days, with 3 episodes in the past 24 hours.  Patient denies bowel and bladder incontinence.  Patient had an MRI of the back on Wednesday of last week at [place].  Patient is able to ambulate from wheelchair to the bed.

## 2015-05-09 NOTE — Discharge Instructions (Signed)
Back Pain, Adult Back pain is very common. The pain often gets better over time. The cause of back pain is usually not dangerous. Most people can learn to manage their back pain on their own.  HOME CARE   Stay active. Start with short walks on flat ground if you can. Try to walk farther each day.  Do not sit, drive, or stand in one place for more than 30 minutes. Do not stay in bed.  Do not avoid exercise or work. Activity can help your back heal faster.  Be careful when you bend or lift an object. Bend at your knees, keep the object close to you, and do not twist.  Sleep on a firm mattress. Lie on your side, and bend your knees. If you lie on your back, put a pillow under your knees.  Only take medicines as told by your doctor.  Put ice on the injured area.  Put ice in a plastic bag.  Place a towel between your skin and the bag.  Leave the ice on for 15-20 minutes, 03-04 times a day for the first 2 to 3 days. After that, you can switch between ice and heat packs.  Ask your doctor about back exercises or massage.  Avoid feeling anxious or stressed. Find good ways to deal with stress, such as exercise. GET HELP RIGHT AWAY IF:   Your pain does not go away with rest or medicine.  Your pain does not go away in 1 week.  You have new problems.  You do not feel well.  The pain spreads into your legs.  You cannot control when you poop (bowel movement) or pee (urinate).  Your arms or legs feel weak or lose feeling (numbness).  You feel sick to your stomach (nauseous) or throw up (vomit).  You have belly (abdominal) pain.  You feel like you may pass out (faint). MAKE SURE YOU:   Understand these instructions.  Will watch your condition.  Will get help right away if you are not doing well or get worse. Document Released: 04/29/2008 Document Revised: 02/03/2012 Document Reviewed: 03/15/2014 [facility] Patient Information 2015 [place], [place]. This information is not intended  to replace advice given to you by your health care provider. Make sure you discuss any questions you have with your health care provider.  Please contact Dr. Brien Few immediately and inform him of your visit today. Please schedule follow-up visit as soon as possible. Please use medication only as directed, please return if new or worsening signs or symptoms present.

## 2015-05-09 NOTE — ED Notes (Signed)
PA Hedges at bedside  

## 2015-05-09 NOTE — ED Provider Notes (Signed)
CSN: [id number]     Arrival date & time 05/09/15  1028 History   First MD Initiated Contact with Patient 05/09/15 1031     Chief Complaint  Patient presents with  . Numbness    right leg  . Nausea     HPI    29 year old male presents with back pain for the last 3-4 months. Patient reports that he recently started working physical labor job, reports his back pain started around that time, denies any specific event. He reports that initially was in his lower back with no radiation of symptoms. He notes that over the last month or so he started developing radiation pain down into his right leg, with over the last 2 weeks developing numbness along his anterior aspect of his right upper leg. Patient reports he's been seen multiple times by Dr. Brien Few with injections for the back pain. He reports these have not improved symptoms. MRI study several ordered and evaluated, with the most recent one last week on 05/03/2015. He reports that contact him forming at the results were in requesting he be seen in the office, they reported soonest appointment is 06/05/2015. Patient reports symptoms continue to persist with an increase in the "tingling" sensation in his upper leg. Patient denies saddle anesthesia, bladder or bowel incontinence or retention, fever, IV drug use, trauma to the back, or any other red flags. Patient reports that starting his new job in November he has been losing weight consistently, he attributes this to working outside in a very physical job. Patient maintains good by mouth intake. Ports that work today he had episode of vomiting due to back pain, reports he has not been given any pain medication for this. Patient is able to ambulate, but with difficulty.   Past Medical History  Diagnosis Date  . Tobacco dipper   . GERD (gastroesophageal reflux disease)   . ADHD (attention deficit hyperactivity disorder)   . Hx MRSA infection 8/08    right thigh  . S/P colonoscopy 11/10, 10/08   Dr [name]  . S/P endoscopy 10/08, 2/10, 5/10    Dr Raliegh Scarlet reflux esophagitis, small HH  . Occult GI bleeding 12/2008    Trivial upper GI bleed/uncontrolled GERD by upper endoscopy 12/2008, normal f/u endoscopy 03/2009 with SBCE at that time  . Bipolar 1 disorder   . Esophagitis     Distal esophageal erosions consistent with mild erosive  reflux esophagitis 12/2008 by EGD   . Thyroid function test abnormal     Noted in 2011 discharge  . Obesity   . Gastric ulcer   . Hypercholesteremia   . Hiatal hernia    Past Surgical History  Procedure Laterality Date  . Vasectomy    . Ankle surgery    . Small bowel capsule  04/11/2009     normal throughout  . Esophagogastroduodenoscopy   04/07/2009    Normal esophagus, small hiatal hernia  . Esophagogastroduodenoscopy  01/09/2009    Distal esophageal erosions consistent with mild erosive reflux esophagitis, otherwise normal esophagus, small hiatal herniaotherwise normal stomach, D1-D2   . Ileocolonoscopy  08/26/2007     A normal rectum, colon, and terminal ileum  . Esophagogastroduodenoscopy  08/26/2007    Normal esophagus, a small hiatal/hernia, otherwise normal stomach D1 through D3  . Colonoscopy  10/10/2009    anal papilla otherwise normal   Family History  Problem Relation Age of Onset  . Leukemia Father 1  . Seizures Mother   . Bipolar disorder Brother   .  ADD / ADHD Brother    History  Substance Use Topics  . Smoking status: Never Smoker   . Smokeless tobacco: Current User    Types: Chew     Comment: DIPx 10 years  . Alcohol Use: Yes     Comment: weekends    Review of Systems  All other systems reviewed and are negative.   Allergies  Influenza vaccines; Tramadol; Ibuprofen; and Tylenol  Home Medications   Prior to Admission medications   Medication Sig Start Date End Date Taking? Authorizing Provider  citalopram (CELEXA) 20 MG tablet TAKE 1 TABLET (20 MG) BY MOUTH DAILY 04/21/15  Yes Historical Provider,  MD  hydrOXYzine (VISTARIL) 25 MG capsule TAKE 1 CAPSULE (25 MG) BY MOUTH EVERY 8 HOURS AS NEEDED FOR ANXIETY OR SLEEP 04/21/15  Yes Historical Provider, MD  Oxcarbazepine (TRILEPTAL) 300 MG tablet Take 300 mg by mouth 2 (two) times daily.   Yes Historical Provider, MD  pantoprazole (PROTONIX) 40 MG tablet Take 1 tablet (40 mg total) by mouth daily. Patient taking differently: Take 40 mg by mouth at bedtime.  03/31/14 07/15/15 Yes Lysbeth Penner, FNP  ondansetron (ZOFRAN) 4 MG tablet Take 1 tablet (4 mg total) by mouth every 6 (six) hours. 05/09/15   Okey Regal, PA-C  oxyCODONE (ROXICODONE) 5 MG immediate release tablet Take 1 tablet (5 mg total) by mouth every 6 (six) hours as needed for severe pain. 05/09/15   Darely Becknell, PA-C   BP 118/47 mmHg  Pulse 70  Temp(Src) 97.5 F (36.4 C) (Oral)  Resp 20  Ht 6' (1.829 m)  Wt 350 lb (158.759 kg)  BMI 47.46 kg/m2  SpO2 97%\   Physical Exam  Constitutional: He is oriented to person, place, and time. He appears well-developed and well-nourished.  HENT:  Head: Normocephalic and atraumatic.  Eyes: Conjunctivae are normal. Pupils are equal, round, and reactive to light. Right eye exhibits no discharge. Left eye exhibits no discharge. No scleral icterus.  Neck: Normal range of motion. No JVD present. No tracheal deviation present.  Cardiovascular: Normal rate, regular rhythm and normal heart sounds.   Pulmonary/Chest: Effort normal. No stridor.  Musculoskeletal:  Patient has bilateral lower thoracic and lumbar tenderness to palpation, no signs trauma, no obvious deformities. Patient has 5 out of 5 strength to the distal extremities. No pain to the cervical or upper thoracic area  Neurological: He is alert and oriented to person, place, and time. Coordination normal.  Right distal extremity decreased sensation over the anterior thigh and medial foot. Remainder of exam normal. She has full range of motion of his hips knees and ankles with 5 out of 5  strength. Left normal. No saddle anesthesia  Psychiatric: He has a normal mood and affect. His behavior is normal. Judgment and thought content normal.  Nursing note and vitals reviewed.   ED Course  Procedures (including critical care time) Labs Review Labs Reviewed  HEPATIC FUNCTION PANEL - Abnormal; Notable for the following:    Total Protein 6.2 (*)    Bilirubin, Direct <0.1 (*)    All other components within normal limits  URINALYSIS, ROUTINE W REFLEX MICROSCOPIC (NOT AT [facility])  CBC WITH DIFFERENTIAL/PLATELET  BASIC METABOLIC PANEL    Imaging Review No results found.   EKG Interpretation None      MDM   Final diagnoses:  Bilateral low back pain with right-sided sciatica    Labs: Urinalysis, CBC, BMP, hepatic function- no significant findings  Imaging: Most recent MRI study  on 05/03/2015 showed degenerative disc disease and facet arthrosis in the lower lumbar spine. This is most notable for a left paracentral disc protrusion at L5-S1 impinging the transversing left S1 nerve root moderate left neural foraminal stenosis also appreciated specifically no acute fracture concerning marrow signal abnormality, alignment maintained, spinal cord signal is normal and terminates at T12-L1. These results are not significantly changed from previous  Consults: None  Therapeutics: Dilaudid, Zofran and normal saline  Assessment: Back pain  Plan: Patient presents with chronic back pain. This pain is slowly worsening, he's been seen multiple times treated with injections, and to MRI studies. Patient was contacted on Friday and has a follow-up appointment July 11 with Dr. Brien Few. Patient has no red flags at today's visit, he is instructed to contact back specialist, for sooner follow-up. He was given antinausea medication, pain medication, and instructions to follow-up immediately for further evaluation and management. He is instructed to return immediately to the emergency room if new  worsening signs or symptoms present. Patient verbalizes understanding and agreement to today's plan and had no further questions concerns at time of discharge      Okey Regal, PA-C 05/09/15 [place], MD 05/10/15 [phone]

## 2015-05-15 ENCOUNTER — Ambulatory Visit (INDEPENDENT_AMBULATORY_CARE_PROVIDER_SITE_OTHER): Payer: 59 | Admitting: Internal Medicine

## 2015-05-15 ENCOUNTER — Encounter: Payer: Self-pay | Admitting: Internal Medicine

## 2015-05-15 VITALS — BP 124/66 | HR 75 | Ht 69.0 in | Wt 391.0 lb

## 2015-05-15 DIAGNOSIS — G4733 Obstructive sleep apnea (adult) (pediatric): Secondary | ICD-10-CM

## 2015-05-15 DIAGNOSIS — J302 Other seasonal allergic rhinitis: Secondary | ICD-10-CM

## 2015-05-15 DIAGNOSIS — J309 Allergic rhinitis, unspecified: Secondary | ICD-10-CM

## 2015-05-15 DIAGNOSIS — J3089 Other allergic rhinitis: Secondary | ICD-10-CM

## 2015-05-15 NOTE — Patient Instructions (Addendum)
Order- DME Advanced Linna Hoff) evaluate for replacement CPAP machine, mask of choice, supplies, humidifier                Set pressure at 20             Please evaluate while his CPAP humidifier is running out so fast, needing refills each night

## 2015-05-15 NOTE — Progress Notes (Signed)
07/14/12- 58 yoM nonsmoker referred courtesy of Dr Rayann Heman for evaluation of sleep complaints.  He has a hx of loud snoring and daytime somnolence, with no ENT surgery. A recent ER visit for chest pain ruled out cardiac. While there he was witnessed having apneic episodes. Wife notes loud snoring. He admits history of easy daytime sleepiness if sits quietly. Was treated with Ritalin in the past but not in recent years. Denies hypnagogic hallucinations, sleep paralysis cataplexy by my description.   bedtime between 10 and 11 PM, sleep latency one hour, wakes 3 times, and is rolling over, before up at 8 AM. History of bipolar disorder, ADHD but no cardiopulmonary disease and no ENT surgery. 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  08/20/12- 50 yoM nonsmoker followed for obstructive sleep apnea complicated by history of bipolar disorder, GERD, obesity He recognizes no changes since last visit. NPSG 08/03/12-  AHI 24.1/ hr, moderate OSA. Discussed treatment options   10/01/12- 26 yoM nonsmoker followed for obstructive sleep apnea complicated by history of bipolar disorder, GERD, obesity Review CPAP usage;pt wears CPAP every night for about 7 hours; still moves alot during sleep Had flu vax. Using CPAP 17/ Advanced all night, every night. Changed from auto to fixed pressure 17 with god control documented. Full face mask and humidifier.  6/18/ 14-27 yoM nonsmoker followed for obstructive sleep apnea complicated by history of bipolar disorder, GERD, obesity FOLLOWS FOR: wears CPAP/ 17/ Advanced every night for at least 6 hours; Can tell if he hasnt slept with it-worn out and snores.   05/12/14- 62 yoM nonsmoker followed for obstructive sleep apnea complicated by history of bipolar disorder, GERD, obesity CPAP/ 17/ Advanced   FOLLOWS FOR: when wearing CPAP gets about 8 hours-in the past 6 months has worn about 8 times-works so much and lays down without it. DME is [facility].  Says he is exhausted when he gets home from work and lies down without his CPAP-discussed. Eyes itch, stuffy nose "allergy"  05/15/15- 28 yoM nonsmoker followed for obstructive sleep apnea, allergic rinitis complicated by history of bipolar disorder, GERD, obesity  wife here FOLLOWS FOR: wears CPAP 17/ Advanced every night-[facility]; DL attached. Having allergy flare up-watery eyes, white spots in nose(dry at times)-spends alot of time outside. Download indicates adequate use and good control at 17. Wife reports he snores through CPAP at times. She is having to refill humidifier during the night. No obvious leak. Full face mask.  ROS-see HPI Constitutional:   No-   weight loss, night sweats, fevers, chills, fatigue, lassitude. HEENT:   No-  headaches, difficulty swallowing, tooth/dental problems, sore throat,       No-  sneezing, itching, ear ache, +nasal congestion, post nasal drip,  CV:  No-  cardiac chest pain, orthopnea, PND, swelling in lower extremities, anasarca,  dizziness, palpitations Resp: + shortness of breath with exertion or at rest.              No-   productive cough,  No non-productive cough,  No- coughing up of blood.              No-   change in color of mucus.  No- wheezing.   Skin: No-   rash or lesions. GI:  + heartburn, indigestion, no-abdominal pain, nausea, vomiting,  GU:  MS:  No-   joint pain or swelling.  Neuro-     nothing unusual Psych:   See HPI-No- change in mood or affect. +  depression or anxiety.  No memory loss.  OBJ- Physical Exam General- Alert, Oriented, Affect-appropriate, Distress- none acute, +obese/ big Skin- rash-none, lesions- none, excoriation- none Lymphadenopathy- none Head- atraumatic            Eyes- Gross vision intact, PERRLA, conjunctivae and secretions clear            Ears- Hearing, canals-normal            Nose- Clear, no-Septal dev, mucus, polyps, erosion, perforation             Throat- Mallampati III , mucosa clear , drainage- none,  tonsils- 2+ Neck- flexible , trachea midline, no stridor , thyroid nl, carotid no bruit Chest - symmetrical excursion , unlabored           Heart/CV- RRR , no murmur , no gallop  , no rub, nl s1 s2                           - JVD- none , edema- none, stasis changes- none, varices- none           Lung- clear to P&A, wheeze- none, cough- none , dullness-none, rub- none           Chest wall-  Abd-  Br/ Gen/ Rectal- Not done, not indicated Extrem- cyanosis- none, clubbing, none, atrophy- none, strength- nl Neuro- grossly intact to observation

## 2015-05-21 ENCOUNTER — Emergency Department (HOSPITAL_BASED_OUTPATIENT_CLINIC_OR_DEPARTMENT_OTHER)
Admission: EM | Admit: 2015-05-21 | Discharge: 2015-05-21 | Disposition: A | Discharge status: Home / Self Care | Payer: 59 | Attending: Emergency Medicine | Admitting: Emergency Medicine

## 2015-05-21 ENCOUNTER — Emergency Department (HOSPITAL_BASED_OUTPATIENT_CLINIC_OR_DEPARTMENT_OTHER): Payer: 59

## 2015-05-21 ENCOUNTER — Encounter (HOSPITAL_BASED_OUTPATIENT_CLINIC_OR_DEPARTMENT_OTHER): Payer: Self-pay | Admitting: Emergency Medicine

## 2015-05-21 DIAGNOSIS — F319 Bipolar disorder, unspecified: Secondary | ICD-10-CM | POA: Insufficient documentation

## 2015-05-21 DIAGNOSIS — Y92096 Garden or yard of other non-institutional residence as the place of occurrence of the external cause: Secondary | ICD-10-CM | POA: Diagnosis not present

## 2015-05-21 DIAGNOSIS — W1849XA Other slipping, tripping and stumbling without falling, initial encounter: Secondary | ICD-10-CM | POA: Diagnosis not present

## 2015-05-21 DIAGNOSIS — Z8614 Personal history of Methicillin resistant Staphylococcus aureus infection: Secondary | ICD-10-CM | POA: Diagnosis not present

## 2015-05-21 DIAGNOSIS — S29011A Strain of muscle and tendon of front wall of thorax, initial encounter: Secondary | ICD-10-CM | POA: Insufficient documentation

## 2015-05-21 DIAGNOSIS — Z720 Tobacco use: Secondary | ICD-10-CM | POA: Insufficient documentation

## 2015-05-21 DIAGNOSIS — Y9389 Activity, other specified: Secondary | ICD-10-CM | POA: Insufficient documentation

## 2015-05-21 DIAGNOSIS — Z79899 Other long term (current) drug therapy: Secondary | ICD-10-CM | POA: Diagnosis not present

## 2015-05-21 DIAGNOSIS — K219 Gastro-esophageal reflux disease without esophagitis: Secondary | ICD-10-CM | POA: Diagnosis not present

## 2015-05-21 DIAGNOSIS — Y998 Other external cause status: Secondary | ICD-10-CM | POA: Diagnosis not present

## 2015-05-21 DIAGNOSIS — E669 Obesity, unspecified: Secondary | ICD-10-CM | POA: Diagnosis not present

## 2015-05-21 DIAGNOSIS — S299XXA Unspecified injury of thorax, initial encounter: Secondary | ICD-10-CM | POA: Diagnosis present

## 2015-05-21 LAB — CBC
HCT: 45.6 % (ref 39.0–52.0)
Hemoglobin: 15.3 g/dL (ref 13.0–17.0)
MCH: 28.2 pg (ref 26.0–34.0)
MCHC: 33.6 g/dL (ref 30.0–36.0)
MCV: 84 fL (ref 78.0–100.0)
Platelets: 309 10*3/uL (ref 150–400)
RBC: 5.43 MIL/uL (ref 4.22–5.81)
RDW: 14.9 % (ref 11.5–15.5)
WBC: 11.6 10*3/uL — ABNORMAL HIGH (ref 4.0–10.5)

## 2015-05-21 LAB — BASIC METABOLIC PANEL
ANION GAP: 10 (ref 5–15)
BUN: 15 mg/dL (ref 6–20)
CO2: 28 mmol/L (ref 22–32)
CREATININE: 0.86 mg/dL (ref 0.61–1.24)
Calcium: 9.3 mg/dL (ref 8.9–10.3)
Chloride: 102 mmol/L (ref 101–111)
GFR calc Af Amer: >60 mL/min (ref >60)
GLUCOSE: 85 mg/dL (ref 65–99)
Potassium: 3.7 mmol/L (ref 3.5–5.1)
Sodium: 140 mmol/L (ref 135–145)

## 2015-05-21 LAB — CBG MONITORING, ED: Glucose-Capillary: 87 mg/dL (ref 65–99)

## 2015-05-21 LAB — TROPONIN I: Troponin I: <0.03 ng/mL (ref <0.031)

## 2015-05-21 MED ORDER — GI COCKTAIL ~~LOC~~
30.0000 mL | Freq: Once | ORAL | Status: AC
  Administered 2015-05-21: 30 mL via ORAL
  Filled 2015-05-21: qty 30

## 2015-05-21 MED ORDER — ONDANSETRON HCL 4 MG/2ML IJ SOLN
4.0000 mg | Freq: Once | INTRAMUSCULAR | Status: AC
  Administered 2015-05-21: 4 mg via INTRAVENOUS
  Filled 2015-05-21: qty 2

## 2015-05-21 MED ORDER — OXYCODONE HCL 5 MG PO TABS
5.0000 mg | ORAL_TABLET | Freq: Once | ORAL | Status: AC
Start: 2015-05-21 — End: 2015-05-21
  Administered 2015-05-21: 5 mg via ORAL
  Filled 2015-05-21: qty 1

## 2015-05-21 NOTE — Discharge Instructions (Signed)
Muscle Strain A muscle strain is an injury that occurs when a muscle is stretched beyond its normal length. Usually a small number of muscle fibers are torn when this happens. Muscle strain is rated in degrees. First-degree strains have the least amount of muscle fiber tearing and pain. Second-degree and third-degree strains have increasingly more tearing and pain.  Usually, recovery from muscle strain takes 1-2 weeks. Complete healing takes 5-6 weeks.  CAUSES  Muscle strain happens when a sudden, violent force placed on a muscle stretches it too far. This may occur with lifting, sports, or a fall.  RISK FACTORS Muscle strain is especially common in athletes.  SIGNS AND SYMPTOMS At the site of the muscle strain, there may be:  Pain.  Bruising.  Swelling.  Difficulty using the muscle due to pain or lack of normal function. DIAGNOSIS  Your health care provider will perform a physical exam and ask about your medical history. TREATMENT  Often, the best treatment for a muscle strain is resting, icing, and applying cold compresses to the injured area.  HOME CARE INSTRUCTIONS   Use the PRICE method of treatment to promote muscle healing during the first 2-3 days after your injury. The PRICE method involves:  Protecting the muscle from being injured again.  Restricting your activity and resting the injured body part.  Icing your injury. To do this, put ice in a plastic bag. Place a towel between your skin and the bag. Then, apply the ice and leave it on from 15-20 minutes each hour. After the third day, switch to moist heat packs.  Apply compression to the injured area with a splint or elastic bandage. Be careful not to wrap it too tightly. This may interfere with blood circulation or increase swelling.  Elevate the injured body part above the level of your heart as often as you can.  Only take over-the-counter or prescription medicines for pain, discomfort, or fever as directed by your  health care provider.  Warming up prior to exercise helps to prevent future muscle strains. SEEK MEDICAL CARE IF:   You have increasing pain or swelling in the injured area.  You have numbness, tingling, or a significant loss of strength in the injured area. MAKE SURE YOU:   Understand these instructions.  Will watch your condition.  Will get help right away if you are not doing well or get worse. Document Released: 11/11/2005 Document Revised: 09/01/2013 Document Reviewed: 06/10/2013 [facility] Patient Information 2015 [place], [place]. This information is not intended to replace advice given to you by your health care provider. Make sure you discuss any questions you have with your health care provider. Chest Wall Pain Chest wall pain is pain in or around the bones and muscles of your chest. It may take up to 6 weeks to get better. It may take longer if you must stay physically active in your work and activities.  CAUSES  Chest wall pain may happen on its own. However, it may be caused by:  A viral illness like the flu.  Injury.  Coughing.  Exercise.  Arthritis.  Fibromyalgia.  Shingles. HOME CARE INSTRUCTIONS   Avoid overtiring physical activity. Try not to strain or perform activities that cause pain. This includes any activities using your chest or your abdominal and side muscles, especially if heavy weights are used.  Put ice on the sore area.  Put ice in a plastic bag.  Place a towel between your skin and the bag.  Leave the ice on  for 15-20 minutes per hour while awake for the first 2 days.  Only take over-the-counter or prescription medicines for pain, discomfort, or fever as directed by your caregiver. SEEK IMMEDIATE MEDICAL CARE IF:   Your pain increases, or you are very uncomfortable.  You have a fever.  Your chest pain becomes worse.  You have new, unexplained symptoms.  You have nausea or vomiting.  You feel sweaty or lightheaded.  You have a  cough with phlegm (sputum), or you cough up blood. MAKE SURE YOU:   Understand these instructions.  Will watch your condition.  Will get help right away if you are not doing well or get worse. Document Released: 11/11/2005 Document Revised: 02/03/2012 Document Reviewed: 07/08/2011 [facility] Patient Information 2015 [place], [place]. This information is not intended to replace advice given to you by your health care provider. Make sure you discuss any questions you have with your health care provider.

## 2015-05-21 NOTE — ED Provider Notes (Signed)
CSN: [id number]     Arrival date & time 05/21/15  1531 History   First MD Initiated Contact with Patient 05/21/15 1655     Chief Complaint  Patient presents with  . Chest Pain     Patient is a 29 y.o. male presenting with chest pain. The history is provided by the patient. No language interpreter was used.  Chest Pain  [name] presents for evaluation of chest pain. He was mowing the lawn with a 0 return lower when it slipped into a ditch. He went to pull the [place] out of the ditch and developed central chest pain. The pain is described as dull in nature with occasional sharp component. Pain is worse with movement and deep breath. He has some associated upper back pain as well. He has a history of chronic low back pain and is followed by a specialist for this. He denies any fevers, vomiting, abdominal pain, leg swelling or pain. He has no history of recent surgery, DVT or PE. Symptoms are moderate and constant.  Past Medical History  Diagnosis Date  . Tobacco dipper   . GERD (gastroesophageal reflux disease)   . ADHD (attention deficit hyperactivity disorder)   . Hx MRSA infection 8/08    right thigh  . S/P colonoscopy 11/10, 10/08    Dr [name]  . S/P endoscopy 10/08, 2/10, 5/10    Dr Raliegh Scarlet reflux esophagitis, small HH  . Occult GI bleeding 12/2008    Trivial upper GI bleed/uncontrolled GERD by upper endoscopy 12/2008, normal f/u endoscopy 03/2009 with SBCE at that time  . Bipolar 1 disorder   . Esophagitis     Distal esophageal erosions consistent with mild erosive  reflux esophagitis 12/2008 by EGD   . Thyroid function test abnormal     Noted in 2011 discharge  . Obesity   . Gastric ulcer   . Hypercholesteremia   . Hiatal hernia    Past Surgical History  Procedure Laterality Date  . Vasectomy    . Ankle surgery    . Small bowel capsule  04/11/2009     normal throughout  . Esophagogastroduodenoscopy   04/07/2009    Normal esophagus, small hiatal hernia  .  Esophagogastroduodenoscopy  01/09/2009    Distal esophageal erosions consistent with mild erosive reflux esophagitis, otherwise normal esophagus, small hiatal herniaotherwise normal stomach, D1-D2   . Ileocolonoscopy  08/26/2007     A normal rectum, colon, and terminal ileum  . Esophagogastroduodenoscopy  08/26/2007    Normal esophagus, a small hiatal/hernia, otherwise normal stomach D1 through D3  . Colonoscopy  10/10/2009    anal papilla otherwise normal   Family History  Problem Relation Age of Onset  . Leukemia Father 66  . Seizures Mother   . Bipolar disorder Brother   . ADD / ADHD Brother    History  Substance Use Topics  . Smoking status: Never Smoker   . Smokeless tobacco: Current User    Types: Chew     Comment: DIPx 10 years  . Alcohol Use: Yes     Comment: weekends    Review of Systems  Cardiovascular: Positive for chest pain.  All other systems reviewed and are negative.     Allergies  Influenza vaccines; Tramadol; Ibuprofen; and Tylenol  Home Medications   Prior to Admission medications   Medication Sig Start Date End Date Taking? Authorizing Provider  citalopram (CELEXA) 20 MG tablet TAKE 1 TABLET (20 MG) BY MOUTH DAILY 04/21/15   Historical Provider,  MD  hydrOXYzine (VISTARIL) 25 MG capsule TAKE 1 CAPSULE (25 MG) BY MOUTH EVERY 8 HOURS AS NEEDED FOR ANXIETY OR SLEEP 04/21/15   Historical Provider, MD  Oxcarbazepine (TRILEPTAL) 300 MG tablet Take 300 mg by mouth 2 (two) times daily.    Historical Provider, MD  oxyCODONE (ROXICODONE) 5 MG immediate release tablet Take 1 tablet (5 mg total) by mouth every 6 (six) hours as needed for severe pain. 05/09/15   Okey Regal, PA-C  pantoprazole (PROTONIX) 40 MG tablet Take 1 tablet (40 mg total) by mouth daily. Patient taking differently: Take 40 mg by mouth at bedtime.  03/31/14 07/15/15  Lysbeth Penner, FNP   BP 126/71 mmHg  Pulse 83  Ht 6' (1.829 m)  Wt 340 lb (154.223 kg)  BMI 46.10 kg/m2  SpO2  97% Physical Exam  Constitutional: He is oriented to person, place, and time. He appears well-developed and well-nourished.  Obese  HENT:  Head: Normocephalic and atraumatic.  Cardiovascular: Normal rate and regular rhythm.   No murmur heard. Pulmonary/Chest: Effort normal and breath sounds normal. No respiratory distress.  Abdominal: Soft. There is no tenderness. There is no rebound and no guarding.  Musculoskeletal: He exhibits no edema or tenderness.  Neurological: He is alert and oriented to person, place, and time.  Skin: Skin is warm and dry.  Psychiatric: He has a normal mood and affect. His behavior is normal.  Nursing note and vitals reviewed.   ED Course  Procedures (including critical care time) Labs Review Labs Reviewed  CBC - Abnormal; Notable for the following:    WBC 11.6 (*)    All other components within normal limits  TROPONIN I  BASIC METABOLIC PANEL  TROPONIN I  CBG MONITORING, ED    Imaging Review Dg Chest Port 1 View  05/21/2015   CLINICAL DATA:  Midsternal chest pain, worse today after lifting a lawnmower. Pain radiates posteriorly. Shortness of breath upon exertion.  EXAM: PORTABLE CHEST - 1 VIEW  COMPARISON:  04/10/2014  FINDINGS: The heart size and mediastinal contours are within normal limits. Both lungs are clear. The visualized skeletal structures are unremarkable.  IMPRESSION: No active disease.   Electronically Signed   By: Nolon Nations M.D.   On: 05/21/2015 16:01     EKG Interpretation   Date/Time:  Sunday May 21 2015 15:40:58 EDT Ventricular Rate:  81 PR Interval:  146 QRS Duration: 88 QT Interval:  360 QTC Calculation: 418 R Axis:   34 Text Interpretation:  Normal sinus rhythm Normal ECG Confirmed by [name] [phone]) on 05/21/2015 3:58:20 PM      MDM   Final diagnoses:  Chest wall muscle strain, initial encounter    Patient here for evaluation of chest pain that started after lifting a lawnmower. Pain is reproducible on  examination. He is in no respiratory distress. History and presentation is not consistent with PE or ACS. There is one documented vital sign of heart rate of 138 which appears to be documented air. Patient did not have a tachycardia in the department. Discussed home care for chest wall strain.    Quintella Reichert, MD 05/21/15 [phone]

## 2015-05-21 NOTE — ED Notes (Signed)
Pt concerned about the wait time to be seen by EDP. Discussed with EDP the patient's concern , pain and current assessment. EDP reports will see patient in few minutes.

## 2015-05-21 NOTE — ED Notes (Signed)
Pt was doing yardwork, heavy lifting when he felt a pain in his central chest along his sternum.  Describes as a heaviness "like someone is sitting on me."

## 2015-05-26 ENCOUNTER — Emergency Department (HOSPITAL_COMMUNITY)
Admission: EM | Admit: 2015-05-26 | Discharge: 2015-05-26 | Disposition: A | Discharge status: Home / Self Care | Payer: 59 | Attending: Emergency Medicine | Admitting: Emergency Medicine

## 2015-05-26 ENCOUNTER — Encounter (HOSPITAL_COMMUNITY): Payer: Self-pay | Admitting: Neurology

## 2015-05-26 ENCOUNTER — Emergency Department (HOSPITAL_COMMUNITY): Payer: 59

## 2015-05-26 DIAGNOSIS — Z8614 Personal history of Methicillin resistant Staphylococcus aureus infection: Secondary | ICD-10-CM | POA: Insufficient documentation

## 2015-05-26 DIAGNOSIS — R0789 Other chest pain: Secondary | ICD-10-CM | POA: Diagnosis not present

## 2015-05-26 DIAGNOSIS — Z79899 Other long term (current) drug therapy: Secondary | ICD-10-CM | POA: Diagnosis not present

## 2015-05-26 DIAGNOSIS — F319 Bipolar disorder, unspecified: Secondary | ICD-10-CM | POA: Insufficient documentation

## 2015-05-26 DIAGNOSIS — E669 Obesity, unspecified: Secondary | ICD-10-CM | POA: Insufficient documentation

## 2015-05-26 DIAGNOSIS — I959 Hypotension, unspecified: Secondary | ICD-10-CM

## 2015-05-26 DIAGNOSIS — R079 Chest pain, unspecified: Secondary | ICD-10-CM | POA: Diagnosis present

## 2015-05-26 DIAGNOSIS — K219 Gastro-esophageal reflux disease without esophagitis: Secondary | ICD-10-CM | POA: Diagnosis not present

## 2015-05-26 DIAGNOSIS — Z9889 Other specified postprocedural states: Secondary | ICD-10-CM | POA: Insufficient documentation

## 2015-05-26 LAB — I-STAT TROPONIN, ED: TROPONIN I, POC: 0 ng/mL (ref 0.00–0.08)

## 2015-05-26 LAB — BASIC METABOLIC PANEL
Anion gap: 7 (ref 5–15)
BUN: 8 mg/dL (ref 6–20)
CO2: 30 mmol/L (ref 22–32)
Calcium: 8.7 mg/dL — ABNORMAL LOW (ref 8.9–10.3)
Chloride: 102 mmol/L (ref 101–111)
Creatinine, Ser: 0.98 mg/dL (ref 0.61–1.24)
GLUCOSE: 81 mg/dL (ref 65–99)
Potassium: 3.3 mmol/L — ABNORMAL LOW (ref 3.5–5.1)
SODIUM: 139 mmol/L (ref 135–145)

## 2015-05-26 LAB — CBC
HCT: 40.6 % (ref 39.0–52.0)
HEMOGLOBIN: 13.8 g/dL (ref 13.0–17.0)
MCH: 28.5 pg (ref 26.0–34.0)
MCHC: 34 g/dL (ref 30.0–36.0)
MCV: 83.9 fL (ref 78.0–100.0)
PLATELETS: 263 10*3/uL (ref 150–400)
RBC: 4.84 MIL/uL (ref 4.22–5.81)
RDW: 14.8 % (ref 11.5–15.5)
WBC: 9.6 10*3/uL (ref 4.0–10.5)

## 2015-05-26 MED ORDER — SODIUM CHLORIDE 0.9 % IV BOLUS (SEPSIS)
1000.0000 mL | Freq: Once | INTRAVENOUS | Status: DC
Start: 2015-05-26 — End: 2015-05-26

## 2015-05-26 MED ORDER — ONDANSETRON 4 MG PO TBDP
8.0000 mg | ORAL_TABLET | Freq: Once | ORAL | Status: AC
  Administered 2015-05-26: 8 mg via ORAL
  Filled 2015-05-26: qty 2

## 2015-05-26 MED ORDER — SODIUM CHLORIDE 0.9 % IV BOLUS (SEPSIS)
500.0000 mL | Freq: Once | INTRAVENOUS | Status: AC
  Administered 2015-05-26: 500 mL via INTRAVENOUS

## 2015-05-26 MED ORDER — IOHEXOL 350 MG/ML SOLN
100.0000 mL | Freq: Once | INTRAVENOUS | Status: AC | PRN
  Administered 2015-05-26: 100 mL via INTRAVENOUS

## 2015-05-26 NOTE — ED Notes (Signed)
Pt reports central cp since yesterday, center of chest, "sharp". Vomiting x 2 today, denies sob. Pt is a x 4. In NAD

## 2015-05-26 NOTE — ED Notes (Signed)
Spoke with patient about chest wall pain relief. Patient is unable to take Ibuprofen due to stomach ulcers. Patient is allergic to tylenol. Stated I would speak with MD about pain relief.

## 2015-05-26 NOTE — ED Notes (Signed)
MD Vanita Panda made aware of the patients BP. Stated he was stable to go home.

## 2015-05-26 NOTE — ED Notes (Signed)
Patient returned from CT

## 2015-05-26 NOTE — ED Notes (Signed)
Spoke with MD about patient's change in BP.

## 2015-05-26 NOTE — Discharge Instructions (Signed)

## 2015-05-26 NOTE — ED Provider Notes (Signed)
CSN: [id number]     Arrival date & time 05/26/15  1146 History   First MD Initiated Contact with Patient 05/26/15 1210     Chief Complaint  Patient presents with  . Chest Pain     (Consider location/radiation/quality/duration/timing/severity/associated sxs/prior Treatment) HPI Patient reports he developed right-sided chest pain today is sharp and worse if he moves in certain directions. He reports it feels like it's moving around to the side. He reports he did throw up twice today. He denies that he has shortness of breath. He reports sometimes he coughs a little bit but he has not had a fever or mucus production. No associated lightheadedness or syncope. Patient poor she has had central chest pain that he's been evaluated for the past but this time it was off to the right and moving around. He reports that as different. Past Medical History  Diagnosis Date  . Tobacco dipper   . GERD (gastroesophageal reflux disease)   . ADHD (attention deficit hyperactivity disorder)   . Hx MRSA infection 8/08    right thigh  . S/P colonoscopy 11/10, 10/08    Dr [name]  . S/P endoscopy 10/08, 2/10, 5/10    Dr Raliegh Scarlet reflux esophagitis, small HH  . Occult GI bleeding 12/2008    Trivial upper GI bleed/uncontrolled GERD by upper endoscopy 12/2008, normal f/u endoscopy 03/2009 with SBCE at that time  . Bipolar 1 disorder   . Esophagitis     Distal esophageal erosions consistent with mild erosive  reflux esophagitis 12/2008 by EGD   . Thyroid function test abnormal     Noted in 2011 discharge  . Obesity   . Gastric ulcer   . Hypercholesteremia   . Hiatal hernia    Past Surgical History  Procedure Laterality Date  . Vasectomy    . Ankle surgery    . Small bowel capsule  04/11/2009     normal throughout  . Esophagogastroduodenoscopy   04/07/2009    Normal esophagus, small hiatal hernia  . Esophagogastroduodenoscopy  01/09/2009    Distal esophageal erosions consistent with mild erosive  reflux esophagitis, otherwise normal esophagus, small hiatal herniaotherwise normal stomach, D1-D2   . Ileocolonoscopy  08/26/2007     A normal rectum, colon, and terminal ileum  . Esophagogastroduodenoscopy  08/26/2007    Normal esophagus, a small hiatal/hernia, otherwise normal stomach D1 through D3  . Colonoscopy  10/10/2009    anal papilla otherwise normal   Family History  Problem Relation Age of Onset  . Leukemia Father 21  . Seizures Mother   . Bipolar disorder Brother   . ADD / ADHD Brother    History  Substance Use Topics  . Smoking status: Never Smoker   . Smokeless tobacco: Current User    Types: Chew     Comment: DIPx 10 years  . Alcohol Use: Yes     Comment: weekends    Review of Systems 10 Systems reviewed and are negative for acute change except as noted in the HPI.    Allergies  Influenza vaccines; Tramadol; Ibuprofen; and Tylenol  Home Medications   Prior to Admission medications   Medication Sig Start Date End Date Taking? Authorizing Provider  citalopram (CELEXA) 20 MG tablet TAKE 1 TABLET (20 MG) BY MOUTH DAILY 04/21/15  Yes Historical Provider, MD  hydrOXYzine (VISTARIL) 25 MG capsule TAKE 1 CAPSULE (25 MG) BY MOUTH EVERY 8 HOURS AS NEEDED FOR ANXIETY OR SLEEP 04/21/15  Yes Historical Provider, MD  Oxcarbazepine (TRILEPTAL) 300  MG tablet Take 300 mg by mouth 2 (two) times daily.   Yes Historical Provider, MD  pantoprazole (PROTONIX) 40 MG tablet Take 1 tablet (40 mg total) by mouth daily. Patient taking differently: Take 40 mg by mouth at bedtime.  03/31/14 07/15/15 Yes Lysbeth Penner, FNP  oxyCODONE (ROXICODONE) 5 MG immediate release tablet Take 1 tablet (5 mg total) by mouth every 6 (six) hours as needed for severe pain. 05/09/15   Jeffrey Hedges, PA-C   BP 106/57 mmHg  Pulse 59  Temp(Src) 98.2 F (36.8 C) (Oral)  Resp 21  Ht 6' (1.829 m)  Wt 350 lb (158.759 kg)  BMI 47.46 kg/m2  SpO2 97% Physical Exam  Constitutional: He is oriented to  person, place, and time. He appears well-developed and well-nourished.  Patient is sleeping quietly as I enter the room. His respirations are calm and nonlabored. Patient is morbidly obese. He awakens easily and is nontoxic and alert without acute distress.  HENT:  Head: Normocephalic and atraumatic.  Eyes: EOM are normal. Pupils are equal, round, and reactive to light.  Neck: Neck supple.  Cardiovascular: Normal rate, regular rhythm, normal heart sounds and intact distal pulses.   Pulmonary/Chest: Effort normal and breath sounds normal. He exhibits tenderness.  Patient winces and endorses severe pain to palpation over the anterior chest wall. There is no palpable abnormality. No crepitus, no rash.  Abdominal: Soft. Bowel sounds are normal. He exhibits no distension. There is no tenderness.  Musculoskeletal: Normal range of motion. He exhibits no edema.  Neurological: He is alert and oriented to person, place, and time. He has normal strength. Coordination normal. GCS eye subscore is 4. GCS verbal subscore is 5. GCS motor subscore is 6.  Skin: Skin is warm, dry and intact.  Psychiatric: He has a normal mood and affect.    ED Course  Procedures (including critical care time) Labs Review Labs Reviewed  BASIC METABOLIC PANEL - Abnormal; Notable for the following:    Potassium 3.3 (*)    Calcium 8.7 (*)    All other components within normal limits  CBC  I-STAT TROPOININ, ED    Imaging Review Dg Chest 2 View  05/26/2015   CLINICAL DATA:  Chest pain for 1 day  EXAM: CHEST  2 VIEW  COMPARISON:  May 21, 2015  FINDINGS: Lungs are clear. The heart size and pulmonary vascularity are normal. No adenopathy. No pneumothorax. No bone lesions.  IMPRESSION: No edema or consolidation.   Electronically Signed   By: Lowella Grip III M.D.   On: 05/26/2015 12:52   EKG: SR, no STEMI. No change from old. Nissi Doffing  MDM   Final diagnoses:  Chest wall pain   Patient presents as outlined above. He  has very reproducible right-sided chest wall pain. At this time there is no indication of cardiopulmonary source. Patient counseled to continue to follow-up with his family physician for ongoing evaluation of pain. Reports he cannot take ibuprofen or Tylenol. This being the case he is advised to take conservative measure such as cool packs and light movement exercises.    Charlesetta Shanks, MD 05/26/15 1400

## 2015-05-26 NOTE — ED Notes (Signed)
Patient transported to X-ray 

## 2015-05-31 ENCOUNTER — Telehealth: Payer: Self-pay | Admitting: Internal Medicine

## 2015-05-31 NOTE — Telephone Encounter (Signed)
Spoke with Melissa at [facility]. The order for the pressure change was confirmed by [facility] in the [place] office. Lenna Sciara is going to look into this and call us back.  I have notified the pt of the above information. He verbalized understanding. Pt will await our call back.

## 2015-05-31 NOTE — Telephone Encounter (Signed)
Melissa returned my call. She spoke with her respiratory department in [place]. They do not show an order for a pressure change. She is going to pull the order that was placed on 05/15/15 and get this to the [place] office to handle.  Pt has been made aware of this information. Nothing further was needed at this time.

## 2015-06-09 NOTE — Assessment & Plan Note (Signed)
His compliance is borderline because he dislikes feeling dried out by the CPAP. We need to make adjustments to see if we can help that problem-there is room to reduce his pressure/flow based on the download, but his wife complains that he is snoring through at 17. We will first see what happens when we increase his pressure to 20.. Weight loss would help as discussed.

## 2015-06-09 NOTE — Assessment & Plan Note (Signed)
Not sure if he has some rhinitis problem that makes him feel too dry with his CPAP despite the humidifier. Oral mucosa is not dry at this visit. Discussed use of nasal saline spray.

## 2015-06-11 ENCOUNTER — Encounter (HOSPITAL_COMMUNITY): Payer: Self-pay | Admitting: *Deleted

## 2015-06-11 ENCOUNTER — Emergency Department (HOSPITAL_COMMUNITY)
Admission: EM | Admit: 2015-06-11 | Discharge: 2015-06-12 | Disposition: A | Discharge status: Home / Self Care | Payer: 59 | Attending: Emergency Medicine | Admitting: Emergency Medicine

## 2015-06-11 DIAGNOSIS — E669 Obesity, unspecified: Secondary | ICD-10-CM | POA: Insufficient documentation

## 2015-06-11 DIAGNOSIS — Z8614 Personal history of Methicillin resistant Staphylococcus aureus infection: Secondary | ICD-10-CM | POA: Insufficient documentation

## 2015-06-11 DIAGNOSIS — Y9389 Activity, other specified: Secondary | ICD-10-CM | POA: Diagnosis not present

## 2015-06-11 DIAGNOSIS — Z79899 Other long term (current) drug therapy: Secondary | ICD-10-CM | POA: Insufficient documentation

## 2015-06-11 DIAGNOSIS — T63441A Toxic effect of venom of bees, accidental (unintentional), initial encounter: Secondary | ICD-10-CM | POA: Diagnosis present

## 2015-06-11 DIAGNOSIS — K219 Gastro-esophageal reflux disease without esophagitis: Secondary | ICD-10-CM | POA: Diagnosis not present

## 2015-06-11 DIAGNOSIS — Y998 Other external cause status: Secondary | ICD-10-CM | POA: Insufficient documentation

## 2015-06-11 DIAGNOSIS — Y9289 Other specified places as the place of occurrence of the external cause: Secondary | ICD-10-CM | POA: Insufficient documentation

## 2015-06-11 DIAGNOSIS — F319 Bipolar disorder, unspecified: Secondary | ICD-10-CM | POA: Insufficient documentation

## 2015-06-11 MED ORDER — HYDROMORPHONE HCL 1 MG/ML IJ SOLN
1.0000 mg | Freq: Once | INTRAMUSCULAR | Status: AC
  Administered 2015-06-11: 1 mg via INTRAVENOUS
  Filled 2015-06-11: qty 1

## 2015-06-11 MED ORDER — ONDANSETRON HCL 4 MG/2ML IJ SOLN
4.0000 mg | Freq: Once | INTRAMUSCULAR | Status: AC
  Administered 2015-06-11: 4 mg via INTRAVENOUS
  Filled 2015-06-11: qty 2

## 2015-06-11 MED ORDER — LORATADINE 10 MG PO TABS
10.0000 mg | ORAL_TABLET | Freq: Once | ORAL | Status: AC
  Administered 2015-06-11: 10 mg via ORAL
  Filled 2015-06-11: qty 1

## 2015-06-11 MED ORDER — OXYCODONE HCL 5 MG PO TABS
5.0000 mg | ORAL_TABLET | Freq: Once | ORAL | Status: AC
  Administered 2015-06-11: 5 mg via ORAL
  Filled 2015-06-11: qty 1

## 2015-06-11 MED ORDER — METHYLPREDNISOLONE SODIUM SUCC 125 MG IJ SOLR
125.0000 mg | Freq: Once | INTRAMUSCULAR | Status: AC
  Administered 2015-06-11: 125 mg via INTRAVENOUS
  Filled 2015-06-11: qty 2

## 2015-06-11 MED ORDER — FAMOTIDINE IN NACL 20-0.9 MG/50ML-% IV SOLN
20.0000 mg | Freq: Once | INTRAVENOUS | Status: AC
  Administered 2015-06-11: 20 mg via INTRAVENOUS
  Filled 2015-06-11: qty 50

## 2015-06-11 NOTE — ED Notes (Signed)
Pt c/o swelling to left eye; pt states he was stung by a bee; pt has swelling and redness to left eye; pt took an antihistamine pta, doesn't know what he took

## 2015-06-11 NOTE — ED Provider Notes (Signed)
CSN: [id number]     Arrival date & time 06/11/15  2039 History   First MD Initiated Contact with Patient 06/11/15 2141     Chief Complaint  Patient presents with  . Insect Bite     (Consider location/radiation/quality/duration/timing/severity/associated sxs/prior Treatment) HPI [name] is a 29 y.o. male who presents to the ED with left eye lid pain and swelling and facial swelling. He reports that a couple hours ago he was stung by at least 3 bees. He saw three that he knocked off his face. The stings were to the left upper eye lid. He applied ice and took Benadryl 50 mg. PO he has more swelling and feel like his mouth is dry and swelling. He has been able to swallow without difficulty. He denies shortness of breath.   Past Medical History  Diagnosis Date  . Tobacco dipper   . GERD (gastroesophageal reflux disease)   . ADHD (attention deficit hyperactivity disorder)   . Hx MRSA infection 8/08    right thigh  . S/P colonoscopy 11/10, 10/08    Dr [name]  . S/P endoscopy 10/08, 2/10, 5/10    Dr Raliegh Scarlet reflux esophagitis, small HH  . Occult GI bleeding 12/2008    Trivial upper GI bleed/uncontrolled GERD by upper endoscopy 12/2008, normal f/u endoscopy 03/2009 with SBCE at that time  . Bipolar 1 disorder   . Esophagitis     Distal esophageal erosions consistent with mild erosive  reflux esophagitis 12/2008 by EGD   . Thyroid function test abnormal     Noted in 2011 discharge  . Obesity   . Gastric ulcer   . Hypercholesteremia   . Hiatal hernia    Past Surgical History  Procedure Laterality Date  . Vasectomy    . Ankle surgery    . Small bowel capsule  04/11/2009     normal throughout  . Esophagogastroduodenoscopy   04/07/2009    Normal esophagus, small hiatal hernia  . Esophagogastroduodenoscopy  01/09/2009    Distal esophageal erosions consistent with mild erosive reflux esophagitis, otherwise normal esophagus, small hiatal herniaotherwise normal  stomach, D1-D2   . Ileocolonoscopy  08/26/2007     A normal rectum, colon, and terminal ileum  . Esophagogastroduodenoscopy  08/26/2007    Normal esophagus, a small hiatal/hernia, otherwise normal stomach D1 through D3  . Colonoscopy  10/10/2009    anal papilla otherwise normal   Family History  Problem Relation Age of Onset  . Leukemia Father 54  . Seizures Mother   . Bipolar disorder Brother   . ADD / ADHD Brother    History  Substance Use Topics  . Smoking status: Never Smoker   . Smokeless tobacco: Current User    Types: Chew     Comment: DIPx 10 years  . Alcohol Use: Yes     Comment: weekends    Review of Systems Negative except as stated in HPI   Allergies  Influenza vaccines; Tramadol; Bee venom; Ibuprofen; and Tylenol  Home Medications   Prior to Admission medications   Medication Sig Start Date End Date Taking? Authorizing Provider  citalopram (CELEXA) 20 MG tablet TAKE 1 TABLET (20 MG) BY MOUTH DAILY 04/21/15  Yes Historical Provider, MD  Oxcarbazepine (TRILEPTAL) 300 MG tablet Take 300 mg by mouth 2 (two) times daily.   Yes Historical Provider, MD  pantoprazole (PROTONIX) 40 MG tablet Take 1 tablet (40 mg total) by mouth daily. Patient taking differently: Take 40 mg by mouth at bedtime.  03/31/14 07/15/15 Yes Lysbeth Penner, FNP  ranitidine (ZANTAC) 75 MG tablet Take 225 mg by mouth at bedtime.   Yes Historical Provider, MD  famotidine (PEPCID) 20 MG tablet Take 1 tablet (20 mg total) by mouth 2 (two) times daily. 06/12/15   Hope Bunnie Pion, NP  hydrOXYzine (VISTARIL) 25 MG capsule Take 1 capsule (25 mg total) by mouth 3 (three) times daily as needed for itching. 06/12/15   Hope Bunnie Pion, NP  oxyCODONE (ROXICODONE) 5 MG immediate release tablet Take 1 tablet (5 mg total) by mouth every 6 (six) hours as needed for severe pain. 05/09/15   Okey Regal, PA-C  predniSONE (DELTASONE) 10 MG tablet Take 2 tablets (20 mg total) by mouth 2 (two) times daily with a meal. 06/12/15    Hope Bunnie Pion, NP   BP 126/72 mmHg  Pulse 61  Temp(Src) 98.2 F (36.8 C) (Oral)  Resp 18  Ht 6' (1.829 m)  Wt 340 lb (154.223 kg)  BMI 46.10 kg/m2  SpO2 96% Physical Exam  Constitutional: He is oriented to person, place, and time. He appears well-developed and well-nourished. No distress.  HENT:  Head:    Mouth/Throat: Uvula is midline, oropharynx is clear and moist and mucous membranes are normal.  Eyes: Conjunctivae and EOM are normal. Pupils are equal, round, and reactive to light.  Left upper eye lid with swelling. Patient unable to open his eye due to swelling. Tender on exam.   Neck: Neck supple.  Cardiovascular: Normal rate.   Pulmonary/Chest: Effort normal.  Abdominal: Soft. There is no tenderness.  Musculoskeletal: Normal range of motion.  Neurological: He is alert and oriented to person, place, and time. No cranial nerve deficit.  Skin: Skin is warm and dry.  Psychiatric: He has a normal mood and affect. His behavior is normal.  Nursing note and vitals reviewed.   ED Course  Procedures (including critical care time) IV Solumedrol 125 mg, Pepcid 20 mg IV, Dilaudid 1 mg. IV, Zofran 4 mg IV,  Claritin 10 mg PO, (patient took Benadryl 50 mg prior to arrival).   After medications the patient is feeling better and is now able to open his left eye. Swelling is minimal.   MDM  29 y.o. male with left orbital swelling and facial swelling s/p multiple bee stings. Stable for d/c with minimal swelling, no respiratory symptoms, O2 SAT 96% on R/A. Will d/c home with Epi Pen and Rx for prednisone, Pepcid and Atarax. Discussed with the patient and all questioned fully answered. He will retutn if any problems arise.   Final diagnoses:  Allergic reaction to bee sting, accidental or unintentional, initial encounter     [facility], NP 06/12/15 9169  Nat Christen, MD 06/13/15 1534

## 2015-06-11 NOTE — ED Notes (Signed)
Report given to Angela Burke, RN.

## 2015-06-12 MED ORDER — EPINEPHRINE 0.3 MG/0.3ML IJ SOAJ
0.3000 mg | Freq: Once | INTRAMUSCULAR | Status: DC
  Filled 2015-06-12: qty 0.3

## 2015-06-12 MED ORDER — FAMOTIDINE 20 MG PO TABS: 20.0000 mg | ORAL_TABLET | Freq: Two times a day (BID) | ORAL | Status: DC

## 2015-06-12 MED ORDER — PREDNISONE 10 MG PO TABS: 20.0000 mg | ORAL_TABLET | Freq: Two times a day (BID) | ORAL | Status: DC

## 2015-06-12 MED ORDER — HYDROXYZINE PAMOATE 25 MG PO CAPS: 25.0000 mg | ORAL_CAPSULE | Freq: Three times a day (TID) | ORAL | Status: DC | PRN

## 2015-06-15 ENCOUNTER — Ambulatory Visit (INDEPENDENT_AMBULATORY_CARE_PROVIDER_SITE_OTHER): Payer: 59 | Admitting: Nurse Practitioner

## 2015-06-15 ENCOUNTER — Encounter: Payer: Self-pay | Admitting: Nurse Practitioner

## 2015-06-15 VITALS — BP 109/61 | HR 63 | Temp 97.0°F | Ht 72.0 in | Wt 398.2 lb

## 2015-06-15 DIAGNOSIS — K219 Gastro-esophageal reflux disease without esophagitis: Secondary | ICD-10-CM

## 2015-06-15 MED ORDER — PANTOPRAZOLE SODIUM 40 MG PO TBEC: 40.0000 mg | DELAYED_RELEASE_TABLET | Freq: Every day | ORAL | Status: DC

## 2015-06-15 NOTE — Assessment & Plan Note (Signed)
29 year old male with a history of GERD. Was previously on Nexium which only works temporarily. He has been prone Protonix for approximately 2 years and this is working very well for him. Breakthrough symptoms only when he runs out of his medication. Is generally asymptomatic from a GI standpoint. Today we will refill his Protonix and have him return as needed.

## 2015-06-15 NOTE — Progress Notes (Signed)
cc'ed to pcp °

## 2015-06-15 NOTE — Patient Instructions (Signed)
1. Return as needed for return or worsening of symptoms 2. Call if you need further refills of your Protonix

## 2015-06-15 NOTE — Progress Notes (Signed)
Referring Provider: Stephens Shire, MD Primary Care Physician:  Stephens Shire, MD Primary GI:  Dr. Gala Romney  Chief Complaint  Patient presents with  . Medication Refill    protonix    HPI:   29 year old male presents for follow-up on GERD and to request refill of PPI. Today he states Protonix is working great for him. He initially thought it would not work as Nexium worked previously but only temporarily, but he has been pleasantly surprised with his results on Protonix. He denies breakthrough symptoms unless he runs out of Protonix. Denies abdominal pain, N/V, hematochezia, melena, esophageal burning. Denies chest pain, dyspnea, dizziness, lightheadedness, syncope, near syncope. Denies any other upper or lower GI symptoms.   Past Medical History  Diagnosis Date  . Tobacco dipper   . GERD (gastroesophageal reflux disease)   . ADHD (attention deficit hyperactivity disorder)   . Hx MRSA infection 8/08    right thigh  . S/P colonoscopy 11/10, 10/08    Dr [name]  . S/P endoscopy 10/08, 2/10, 5/10    Dr Raliegh Scarlet reflux esophagitis, small HH  . Occult GI bleeding 12/2008    Trivial upper GI bleed/uncontrolled GERD by upper endoscopy 12/2008, normal f/u endoscopy 03/2009 with SBCE at that time  . Bipolar 1 disorder   . Esophagitis     Distal esophageal erosions consistent with mild erosive  reflux esophagitis 12/2008 by EGD   . Thyroid function test abnormal     Noted in 2011 discharge  . Obesity   . Gastric ulcer   . Hypercholesteremia   . Hiatal hernia     Past Surgical History  Procedure Laterality Date  . Vasectomy    . Ankle surgery    . Small bowel capsule  04/11/2009     normal throughout  . Esophagogastroduodenoscopy   04/07/2009    Normal esophagus, small hiatal hernia  . Esophagogastroduodenoscopy  01/09/2009    Distal esophageal erosions consistent with mild erosive reflux esophagitis, otherwise normal esophagus, small hiatal herniaotherwise normal  stomach, D1-D2   . Ileocolonoscopy  08/26/2007     A normal rectum, colon, and terminal ileum  . Esophagogastroduodenoscopy  08/26/2007    Normal esophagus, a small hiatal/hernia, otherwise normal stomach D1 through D3  . Colonoscopy  10/10/2009    anal papilla otherwise normal    Current Outpatient Prescriptions  Medication Sig Dispense Refill  . citalopram (CELEXA) 20 MG tablet TAKE 1 TABLET (20 MG) BY MOUTH DAILY  2  . hydrOXYzine (VISTARIL) 25 MG capsule Take 1 capsule (25 mg total) by mouth 3 (three) times daily as needed for itching. 30 capsule 0  . Oxcarbazepine (TRILEPTAL) 300 MG tablet Take 300 mg by mouth 2 (two) times daily.    . pantoprazole (PROTONIX) 40 MG tablet Take 1 tablet (40 mg total) by mouth daily. (Patient taking differently: Take 40 mg by mouth at bedtime. ) 30 tablet 11  . predniSONE (DELTASONE) 10 MG tablet Take 2 tablets (20 mg total) by mouth 2 (two) times daily with a meal. 16 tablet 0  . famotidine (PEPCID) 20 MG tablet Take 1 tablet (20 mg total) by mouth 2 (two) times daily. (Patient not taking: Reported on 06/15/2015) 30 tablet 0  . oxyCODONE (ROXICODONE) 5 MG immediate release tablet Take 1 tablet (5 mg total) by mouth every 6 (six) hours as needed for severe pain. (Patient not taking: Reported on 06/15/2015) 15 tablet 0  . ranitidine (ZANTAC) 75 MG tablet Take 225 mg by mouth  at bedtime.     No current facility-administered medications for this visit.    Allergies as of 06/15/2015 - Review Complete 06/15/2015  Allergen Reaction Noted  . Influenza vaccines Shortness Of Breath 05/12/2014  . Tramadol Itching 04/10/2014  . Bee venom Swelling 06/11/2015  . Ibuprofen Other (See Comments) 11/04/2011  . Tylenol [acetaminophen] Other (See Comments) 05/02/2012    Family History  Problem Relation Age of Onset  . Leukemia Father 21  . Seizures Mother   . Bipolar disorder Brother   . ADD / ADHD Brother     History   Social History  . Marital Status:  Married    Spouse Name: N/A  . Number of Children: 2  . Years of Education: N/A   Occupational History  . [employer]   . service tech    Social History Main Topics  . Smoking status: Never Smoker   . Smokeless tobacco: Current User    Types: Chew     Comment: DIPx 10 years  . Alcohol Use: Yes     Comment: weekends  . Drug Use: No  . Sexual Activity: Not on file   Other Topics Concern  . None   Social History Narrative    Review of Systems: General: Negative for anorexia, weight loss, fever, chills, fatigue, weakness. ENT: Negative for hoarseness, difficulty swallowing. CV: Negative for chest pain, angina, palpitations, peripheral edema.  Respiratory: Negative for dyspnea at rest, cough, sputum, wheezing.  GI: See history of present illness. MS: Negative for joint pain, low back pain.  Derm: Negative for rash or itching.  Endo: Negative for unusual weight change.  Heme: Negative for bruising or bleeding. Allergy: Negative for rash or hives.   Physical Exam: BP 109/61 mmHg  Pulse 63  Temp(Src) 97 F (36.1 C) (Oral)  Ht 6' (1.829 m)  Wt 398 lb 3.2 oz (180.622 kg)  BMI 53.99 kg/m2 General:   Alert and oriented. Pleasant and cooperative. Morbidly obese. Well-nourished and well-developed.  Head:  Normocephalic and atraumatic. Eyes:  Without icterus, sclera clear and conjunctiva pink.  Ears:  Normal auditory acuity. Cardiovascular:  S1, S2 present without murmurs appreciated. Normal pulses noted. Extremities without clubbing or edema. Respiratory:  Clear to auscultation bilaterally. No wheezes, rales, or rhonchi. No distress.  Gastrointestinal:  +BS, morbidly obese abdomen but soft, non-tender and non-distended. No HSM noted. No guarding or rebound. No masses appreciated.  Rectal:  Deferred  Skin:  Intact without significant lesions or rashes. Neurologic:  Alert and oriented x4;  grossly normal neurologically. Psych:  Alert and cooperative. Normal mood and  affect. Heme/Lymph/Immune: No excessive bruising noted.    06/15/2015 9:01 AM

## 2015-06-20 ENCOUNTER — Encounter: Payer: Self-pay | Admitting: Internal Medicine

## 2015-07-30 ENCOUNTER — Emergency Department (HOSPITAL_BASED_OUTPATIENT_CLINIC_OR_DEPARTMENT_OTHER)
Admission: EM | Admit: 2015-07-30 | Discharge: 2015-07-30 | Disposition: A | Discharge status: Home / Self Care | Payer: 59 | Attending: Emergency Medicine | Admitting: Emergency Medicine

## 2015-07-30 ENCOUNTER — Emergency Department (HOSPITAL_BASED_OUTPATIENT_CLINIC_OR_DEPARTMENT_OTHER): Payer: 59

## 2015-07-30 ENCOUNTER — Encounter (HOSPITAL_BASED_OUTPATIENT_CLINIC_OR_DEPARTMENT_OTHER): Payer: Self-pay | Admitting: Emergency Medicine

## 2015-07-30 DIAGNOSIS — Z8614 Personal history of Methicillin resistant Staphylococcus aureus infection: Secondary | ICD-10-CM | POA: Insufficient documentation

## 2015-07-30 DIAGNOSIS — Z8719 Personal history of other diseases of the digestive system: Secondary | ICD-10-CM | POA: Insufficient documentation

## 2015-07-30 DIAGNOSIS — M545 Low back pain, unspecified: Secondary | ICD-10-CM

## 2015-07-30 DIAGNOSIS — M79604 Pain in right leg: Secondary | ICD-10-CM

## 2015-07-30 DIAGNOSIS — Z79899 Other long term (current) drug therapy: Secondary | ICD-10-CM | POA: Insufficient documentation

## 2015-07-30 DIAGNOSIS — F319 Bipolar disorder, unspecified: Secondary | ICD-10-CM | POA: Diagnosis not present

## 2015-07-30 DIAGNOSIS — Z9889 Other specified postprocedural states: Secondary | ICD-10-CM | POA: Diagnosis not present

## 2015-07-30 DIAGNOSIS — M549 Dorsalgia, unspecified: Secondary | ICD-10-CM | POA: Diagnosis present

## 2015-07-30 DIAGNOSIS — K219 Gastro-esophageal reflux disease without esophagitis: Secondary | ICD-10-CM | POA: Insufficient documentation

## 2015-07-30 NOTE — ED Notes (Signed)
Pt called out for pain medicine. Informed Hess, PA with no new orders given at this time.
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
## 2015-07-30 NOTE — Discharge Instructions (Signed)
Back Pain, Adult Low back pain is very common. About 1 in 5 people have back pain.The cause of low back pain is rarely dangerous. The pain often gets better over time.About half of people with a sudden onset of back pain feel better in just 2 weeks. About 8 in 10 people feel better by 6 weeks.  CAUSES Some common causes of back pain include:  Strain of the muscles or ligaments supporting the spine.  Wear and tear (degeneration) of the spinal discs.  Arthritis.  Direct injury to the back. DIAGNOSIS Most of the time, the direct cause of low back pain is not known.However, back pain can be treated effectively even when the exact cause of the pain is unknown.Answering your caregiver's questions about your overall health and symptoms is one of the most accurate ways to make sure the cause of your pain is not dangerous. If your caregiver needs more information, he or she may order lab work or imaging tests (X-rays or MRIs).However, even if imaging tests show changes in your back, this usually does not require surgery. HOME CARE INSTRUCTIONS For many people, back pain returns.Since low back pain is rarely dangerous, it is often a condition that people can learn to manageon their own.   Remain active. It is stressful on the back to sit or stand in one place. Do not sit, drive, or stand in one place for more than 30 minutes at a time. Take short walks on level surfaces as soon as pain allows.Try to increase the length of time you walk each day.  Do not stay in bed.Resting more than 1 or 2 days can delay your recovery.  Do not avoid exercise or work.Your body is made to move.It is not dangerous to be active, even though your back may hurt.Your back will likely heal faster if you return to being active before your pain is gone.  Pay attention to your body when you bend and lift. Many people have less discomfortwhen lifting if they bend their knees, keep the load close to their bodies,and  avoid twisting. Often, the most comfortable positions are those that put less stress on your recovering back.  Find a comfortable position to sleep. Use a firm mattress and lie on your side with your knees slightly bent. If you lie on your back, put a pillow under your knees.  Only take over-the-counter or prescription medicines as directed by your caregiver. Over-the-counter medicines to reduce pain and inflammation are often the most helpful.Your caregiver may prescribe muscle relaxant drugs.These medicines help dull your pain so you can more quickly return to your normal activities and healthy exercise.  Put ice on the injured area.  Put ice in a plastic bag.  Place a towel between your skin and the bag.  Leave the ice on for 15-20 minutes, 03-04 times a day for the first 2 to 3 days. After that, ice and heat may be alternated to reduce pain and spasms.  Ask your caregiver about trying back exercises and gentle massage. This may be of some benefit.  Avoid feeling anxious or stressed.Stress increases muscle tension and can worsen back pain.It is important to recognize when you are anxious or stressed and learn ways to manage it.Exercise is a great option. SEEK MEDICAL CARE IF:  You have pain that is not relieved with rest or medicine.  You have pain that does not improve in 1 week.  You have new symptoms.  You are generally not feeling well. SEEK   IMMEDIATE MEDICAL CARE IF:   You have pain that radiates from your back into your legs.  You develop new bowel or bladder control problems.  You have unusual weakness or numbness in your arms or legs.  You develop nausea or vomiting.  You develop abdominal pain.  You feel faint. Document Released: 11/11/2005 Document Revised: 05/12/2012 Document Reviewed: 03/15/2014 Exit[employer] Patient Information 2015 [employer]. This information is not intended to replace advice given to you by your health care provider. Make sure you  discuss any questions you have with your health care provider.  

## 2015-08-15 ENCOUNTER — Ambulatory Visit: Payer: 59 | Admitting: Internal Medicine

## 2015-08-21 HISTORY — PX: LUMBAR MICRODISCECTOMY: SHX99

## 2015-08-26 ENCOUNTER — Encounter (HOSPITAL_COMMUNITY): Payer: Self-pay | Admitting: Emergency Medicine

## 2015-08-26 ENCOUNTER — Emergency Department (HOSPITAL_COMMUNITY)
Admission: EM | Admit: 2015-08-26 | Discharge: 2015-08-26 | Disposition: A | Discharge status: Home / Self Care | Payer: 59 | Attending: Emergency Medicine | Admitting: Emergency Medicine

## 2015-08-26 ENCOUNTER — Emergency Department (HOSPITAL_COMMUNITY): Payer: 59

## 2015-08-26 DIAGNOSIS — R05 Cough: Secondary | ICD-10-CM | POA: Insufficient documentation

## 2015-08-26 DIAGNOSIS — K219 Gastro-esophageal reflux disease without esophagitis: Secondary | ICD-10-CM | POA: Diagnosis not present

## 2015-08-26 DIAGNOSIS — Z9889 Other specified postprocedural states: Secondary | ICD-10-CM | POA: Insufficient documentation

## 2015-08-26 DIAGNOSIS — Y998 Other external cause status: Secondary | ICD-10-CM | POA: Diagnosis not present

## 2015-08-26 DIAGNOSIS — F319 Bipolar disorder, unspecified: Secondary | ICD-10-CM | POA: Insufficient documentation

## 2015-08-26 DIAGNOSIS — M545 Low back pain, unspecified: Secondary | ICD-10-CM

## 2015-08-26 DIAGNOSIS — Y9389 Activity, other specified: Secondary | ICD-10-CM | POA: Diagnosis not present

## 2015-08-26 DIAGNOSIS — Z8614 Personal history of Methicillin resistant Staphylococcus aureus infection: Secondary | ICD-10-CM | POA: Insufficient documentation

## 2015-08-26 DIAGNOSIS — W228XXA Striking against or struck by other objects, initial encounter: Secondary | ICD-10-CM | POA: Insufficient documentation

## 2015-08-26 DIAGNOSIS — R509 Fever, unspecified: Secondary | ICD-10-CM | POA: Insufficient documentation

## 2015-08-26 DIAGNOSIS — R200 Anesthesia of skin: Secondary | ICD-10-CM | POA: Insufficient documentation

## 2015-08-26 DIAGNOSIS — Y9289 Other specified places as the place of occurrence of the external cause: Secondary | ICD-10-CM | POA: Insufficient documentation

## 2015-08-26 DIAGNOSIS — R059 Cough, unspecified: Secondary | ICD-10-CM

## 2015-08-26 DIAGNOSIS — Z79899 Other long term (current) drug therapy: Secondary | ICD-10-CM | POA: Diagnosis not present

## 2015-08-26 DIAGNOSIS — S3992XA Unspecified injury of lower back, initial encounter: Secondary | ICD-10-CM | POA: Insufficient documentation

## 2015-08-26 DIAGNOSIS — Z8639 Personal history of other endocrine, nutritional and metabolic disease: Secondary | ICD-10-CM | POA: Insufficient documentation

## 2015-08-26 LAB — BASIC METABOLIC PANEL
Anion gap: 8 (ref 5–15)
BUN: 7 mg/dL (ref 6–20)
CALCIUM: 8.8 mg/dL — AB (ref 8.9–10.3)
CHLORIDE: 95 mmol/L — AB (ref 101–111)
CO2: 30 mmol/L (ref 22–32)
CREATININE: 0.93 mg/dL (ref 0.61–1.24)
GFR calc non Af Amer: >60 mL/min (ref >60)
GLUCOSE: 91 mg/dL (ref 65–99)
Potassium: 3.7 mmol/L (ref 3.5–5.1)
Sodium: 133 mmol/L — ABNORMAL LOW (ref 135–145)

## 2015-08-26 LAB — CBC WITH DIFFERENTIAL/PLATELET
BASOS PCT: 0 %
Basophils Absolute: 0 10*3/uL (ref 0.0–0.1)
EOS ABS: 0.3 10*3/uL (ref 0.0–0.7)
Eosinophils Relative: 3 %
HEMATOCRIT: 40.4 % (ref 39.0–52.0)
HEMOGLOBIN: 13.3 g/dL (ref 13.0–17.0)
LYMPHS ABS: 2.3 10*3/uL (ref 0.7–4.0)
Lymphocytes Relative: 20 %
MCH: 28.4 pg (ref 26.0–34.0)
MCHC: 32.9 g/dL (ref 30.0–36.0)
MCV: 86.3 fL (ref 78.0–100.0)
MONO ABS: 0.7 10*3/uL (ref 0.1–1.0)
MONOS PCT: 6 %
NEUTROS ABS: 8 10*3/uL — AB (ref 1.7–7.7)
NEUTROS PCT: 71 %
Platelets: 283 10*3/uL (ref 150–400)
RBC: 4.68 MIL/uL (ref 4.22–5.81)
RDW: 13.3 % (ref 11.5–15.5)
WBC: 11.3 10*3/uL — ABNORMAL HIGH (ref 4.0–10.5)

## 2015-08-26 LAB — SEDIMENTATION RATE: SED RATE: 23 mm/h — AB (ref 0–16)

## 2015-08-26 LAB — C-REACTIVE PROTEIN: CRP: 10.5 mg/dL — AB (ref <1.0)

## 2015-08-26 MED ORDER — ONDANSETRON HCL 4 MG/2ML IJ SOLN
4.0000 mg | Freq: Once | INTRAMUSCULAR | Status: AC
  Administered 2015-08-26: 4 mg via INTRAVENOUS
  Filled 2015-08-26: qty 2

## 2015-08-26 MED ORDER — HYDROMORPHONE HCL 1 MG/ML IJ SOLN
2.0000 mg | Freq: Once | INTRAMUSCULAR | Status: AC
  Administered 2015-08-26: 2 mg via INTRAVENOUS
  Filled 2015-08-26: qty 2

## 2015-08-26 MED ORDER — HYDROMORPHONE HCL 1 MG/ML IJ SOLN
1.0000 mg | Freq: Once | INTRAMUSCULAR | Status: AC
  Administered 2015-08-26: 1 mg via INTRAVENOUS
  Filled 2015-08-26: qty 1

## 2015-08-26 NOTE — ED Provider Notes (Signed)
CSN: [id number]     Arrival date & time 08/26/15  1916 History   First MD Initiated Contact with Patient 08/26/15 1934     Chief Complaint  Patient presents with  . Back Pain  . Post-op Problem     (Consider location/radiation/quality/duration/timing/severity/associated sxs/prior Treatment) HPI  29 year old male presents with worsening back pain. On 9/26 Dr. Trenton Gammon performed lumbar disc surgery. He went home same day. He states 2 days later he started developing swelling in his back and inferior to his wound. Has been having increasing pain over these last few days. States that 2 days ago he had a temperature of 100.1 and yesterday he had a temperature of 101. Today he has had temperatures in the 99 range. Has had a productive cough but no dyspnea. Denies any increasing weakness but thinks he has been having extra numbness in his left lower extremity that was not there before. Today he slipped out of a chair when it broke and a piece of wood hit him in the back causing even more pain. No bowel or bladder incontinence. No saddle anesthesia. Pain is 10/10.  Past Medical History  Diagnosis Date  . Tobacco dipper   . GERD (gastroesophageal reflux disease)   . ADHD (attention deficit hyperactivity disorder)   . Hx MRSA infection 8/08    right thigh  . S/P colonoscopy 11/10, 10/08    Dr [name]  . S/P endoscopy 10/08, 2/10, 5/10    Dr Raliegh Scarlet reflux esophagitis, small HH  . Occult GI bleeding 12/2008    Trivial upper GI bleed/uncontrolled GERD by upper endoscopy 12/2008, normal f/u endoscopy 03/2009 with SBCE at that time  . Bipolar 1 disorder ([place])   . Esophagitis     Distal esophageal erosions consistent with mild erosive  reflux esophagitis 12/2008 by EGD   . Thyroid function test abnormal     Noted in 2011 discharge  . Obesity   . Gastric ulcer   . Hypercholesteremia   . Hiatal hernia    Past Surgical History  Procedure Laterality Date  . Vasectomy    . Ankle surgery      . Small bowel capsule  04/11/2009     normal throughout  . Esophagogastroduodenoscopy   04/07/2009    Normal esophagus, small hiatal hernia  . Esophagogastroduodenoscopy  01/09/2009    Distal esophageal erosions consistent with mild erosive reflux esophagitis, otherwise normal esophagus, small hiatal herniaotherwise normal stomach, D1-D2   . Ileocolonoscopy  08/26/2007     A normal rectum, colon, and terminal ileum  . Esophagogastroduodenoscopy  08/26/2007    Normal esophagus, a small hiatal/hernia, otherwise normal stomach D1 through D3  . Colonoscopy  10/10/2009    anal papilla otherwise normal  . Back surgery     Family History  Problem Relation Age of Onset  . Leukemia Father 25  . Seizures Mother   . Bipolar disorder Brother   . ADD / ADHD Brother   . Colon cancer Neg Hx    Social History  Substance Use Topics  . Smoking status: Never Smoker   . Smokeless tobacco: Current User    Types: Chew     Comment: DIPx 10 years  . Alcohol Use: 0.0 oz/week    0 Standard drinks or equivalent per week     Comment: Quit drinking 04/2015; used to drink on weekends    Review of Systems  Constitutional: Positive for fever.  Respiratory: Positive for cough. Negative for shortness of breath.   Gastrointestinal:  Negative for vomiting and abdominal pain.  Genitourinary: Negative for dysuria.  Musculoskeletal: Positive for back pain.  Neurological: Positive for numbness. Negative for weakness.  All other systems reviewed and are negative.     Allergies  Influenza vaccines; Tramadol; Bee venom; Ibuprofen; and Tylenol  Home Medications   Prior to Admission medications   Medication Sig Start Date End Date Taking? Authorizing Provider  citalopram (CELEXA) 20 MG tablet TAKE 1 TABLET (20 MG) BY MOUTH DAILY 04/21/15   Historical Provider, MD  hydrOXYzine (VISTARIL) 25 MG capsule Take 1 capsule (25 mg total) by mouth 3 (three) times daily as needed for itching. 06/12/15   Hope Bunnie Pion, NP   Oxcarbazepine (TRILEPTAL) 300 MG tablet Take 300 mg by mouth 2 (two) times daily.    Historical Provider, MD  pantoprazole (PROTONIX) 40 MG tablet Take 1 tablet (40 mg total) by mouth daily. 06/15/15 09/28/16  Carlis Stable, NP   BP 115/56 mmHg  Pulse 105  Temp(Src) 98.1 F (36.7 C) (Oral)  Resp 16  Ht 6' (1.829 m)  Wt 376 lb (170.552 kg)  BMI 50.98 kg/m2  SpO2 97% Physical Exam  Constitutional: He is oriented to person, place, and time. He appears well-developed and well-nourished.  Morbidly obese  HENT:  Head: Normocephalic and atraumatic.  Right Ear: External ear normal.  Left Ear: External ear normal.  Nose: Nose normal.  Eyes: Right eye exhibits no discharge. Left eye exhibits no discharge.  Neck: Neck supple.  Cardiovascular: Normal rate, regular rhythm, normal heart sounds and intact distal pulses.   HR just over 100  Pulmonary/Chest: Effort normal and breath sounds normal. He has no wheezes. He has no rales.  Abdominal: Soft. There is no tenderness.  Musculoskeletal: He exhibits no edema.       Lumbar back: He exhibits tenderness.       Back:  Neurological: He is alert and oriented to person, place, and time.  5/5 strength in both lower extremities. Normal gross sensation  Skin: Skin is warm and dry. He is not diaphoretic.  Nursing note and vitals reviewed.   ED Course  Procedures (including critical care time) Labs Review Labs Reviewed  BASIC METABOLIC PANEL - Abnormal; Notable for the following:    Sodium 133 (*)    Chloride 95 (*)    Calcium 8.8 (*)    All other components within normal limits  CBC WITH DIFFERENTIAL/PLATELET - Abnormal; Notable for the following:    WBC 11.3 (*)    Neutro Abs 8.0 (*)    All other components within normal limits  SEDIMENTATION RATE - Abnormal; Notable for the following:    Sed Rate 23 (*)    All other components within normal limits  C-REACTIVE PROTEIN - Abnormal; Notable for the following:    CRP 10.5 (*)    All other  components within normal limits    Imaging Review Dg Chest 2 View  08/26/2015   CLINICAL DATA:  Acute onset of fever and cough. Status post fall from wheelchair. Initial encounter.  EXAM: CHEST  2 VIEW  COMPARISON:  Chest radiograph and CTA of the chest performed 05/26/2015  FINDINGS: The lungs are well-aerated and clear. There is no evidence of focal opacification, pleural effusion or pneumothorax.  The heart is normal in size; the mediastinal contour is within normal limits. No acute osseous abnormalities are seen.  IMPRESSION: No acute cardiopulmonary process seen. No displaced rib fractures identified.   Electronically Signed   By: Jacqulynn Cadet  Chang M.D.   On: 08/26/2015 21:30   Dg Lumbar Spine Complete  08/26/2015   CLINICAL DATA:  Acute onset of fever and cough. Status post fall from wheelchair. Recent lumbar discectomy. Initial encounter.  EXAM: Lumbar spine radiographs performed 07/30/2015  COMPARISON:  None.  FINDINGS: There is no evidence of fracture or subluxation. Vertebral bodies demonstrate normal height and alignment. Intervertebral disc spaces are preserved. The visualized neural foramina are grossly unremarkable in appearance.  The visualized bowel gas pattern is unremarkable in appearance; air and stool are noted within the colon. The sacroiliac joints are within normal limits.  IMPRESSION: No evidence of fracture or subluxation along the lumbar spine.   Electronically Signed   By: Garald Balding M.D.   On: 08/26/2015 21:31   I have personally reviewed and evaluated these images and lab results as part of my medical decision-making.   EKG Interpretation None      MDM   Final diagnoses:  Acute low back pain  Cough    Patient has no acute neuro deficits. Patient is afebrile here. Mild inflammatory changes on his workup here but no signs of sepsis. Has a mild cough that could be continuing his fever. Very minimal redness to his wound without drainage or signs of gross cellulitis.  Discussed patient's workup and presentation with Dr. Trenton Gammon of neurosurgery. He recommends discharge home and no further imaging including no MRI. Would like for patient to follow up with him closely in outpatient setting. Discussed strict return precautions and to call neurosurgeon or come here if fever returns.    Sherwood Gambler, MD 08/26/15 [phone]

## 2015-08-26 NOTE — ED Notes (Signed)
Pt from home for eval of lower back pain and increase in swelling to lower back, pt states recent back surgery on 9/26. Pt states also today he was sitting in a recliner when he laid back to far and the recliner broke, causing a piece of wood to hit pt in back. Pt denies any urinary or bowel incontinence. nad noted.

## 2015-08-26 NOTE — ED Notes (Signed)
Dr. Goldston at bedside.  

## 2015-09-01 ENCOUNTER — Encounter (HOSPITAL_COMMUNITY): Payer: Self-pay | Admitting: Emergency Medicine

## 2015-09-01 ENCOUNTER — Emergency Department (HOSPITAL_COMMUNITY)
Admission: EM | Admit: 2015-09-01 | Discharge: 2015-09-01 | Disposition: A | Discharge status: Home / Self Care | Payer: 59 | Attending: Emergency Medicine | Admitting: Emergency Medicine

## 2015-09-01 ENCOUNTER — Emergency Department (HOSPITAL_COMMUNITY): Payer: 59

## 2015-09-01 DIAGNOSIS — E669 Obesity, unspecified: Secondary | ICD-10-CM | POA: Diagnosis not present

## 2015-09-01 DIAGNOSIS — J159 Unspecified bacterial pneumonia: Secondary | ICD-10-CM | POA: Insufficient documentation

## 2015-09-01 DIAGNOSIS — K219 Gastro-esophageal reflux disease without esophagitis: Secondary | ICD-10-CM | POA: Insufficient documentation

## 2015-09-01 DIAGNOSIS — M545 Low back pain, unspecified: Secondary | ICD-10-CM

## 2015-09-01 DIAGNOSIS — Z8614 Personal history of Methicillin resistant Staphylococcus aureus infection: Secondary | ICD-10-CM | POA: Diagnosis not present

## 2015-09-01 DIAGNOSIS — Z4801 Encounter for change or removal of surgical wound dressing: Secondary | ICD-10-CM | POA: Diagnosis not present

## 2015-09-01 DIAGNOSIS — J189 Pneumonia, unspecified organism: Secondary | ICD-10-CM

## 2015-09-01 DIAGNOSIS — F909 Attention-deficit hyperactivity disorder, unspecified type: Secondary | ICD-10-CM | POA: Insufficient documentation

## 2015-09-01 DIAGNOSIS — Z79899 Other long term (current) drug therapy: Secondary | ICD-10-CM | POA: Diagnosis not present

## 2015-09-01 DIAGNOSIS — E782 Mixed hyperlipidemia: Secondary | ICD-10-CM | POA: Insufficient documentation

## 2015-09-01 DIAGNOSIS — F319 Bipolar disorder, unspecified: Secondary | ICD-10-CM | POA: Diagnosis not present

## 2015-09-01 DIAGNOSIS — Z5189 Encounter for other specified aftercare: Secondary | ICD-10-CM

## 2015-09-01 HISTORY — DX: Pneumonia, unspecified organism: J18.9

## 2015-09-01 LAB — CBC
HEMATOCRIT: 44.7 % (ref 39.0–52.0)
Hemoglobin: 15 g/dL (ref 13.0–17.0)
MCH: 28.8 pg (ref 26.0–34.0)
MCHC: 33.6 g/dL (ref 30.0–36.0)
MCV: 85.8 fL (ref 78.0–100.0)
Platelets: 332 10*3/uL (ref 150–400)
RBC: 5.21 MIL/uL (ref 4.22–5.81)
RDW: 13.2 % (ref 11.5–15.5)
WBC: 12.6 10*3/uL — ABNORMAL HIGH (ref 4.0–10.5)

## 2015-09-01 LAB — URINALYSIS W MICROSCOPIC (NOT AT ARMC)
Bilirubin Urine: NEGATIVE
Glucose, UA: NEGATIVE mg/dL
HGB URINE DIPSTICK: NEGATIVE
KETONES UR: NEGATIVE mg/dL
LEUKOCYTES UA: NEGATIVE
Nitrite: NEGATIVE
PROTEIN: NEGATIVE mg/dL
Specific Gravity, Urine: 1.023 (ref 1.005–1.030)
Urobilinogen, UA: 0.2 mg/dL (ref 0.0–1.0)
pH: 5.5 (ref 5.0–8.0)

## 2015-09-01 LAB — BASIC METABOLIC PANEL
Anion gap: 10 (ref 5–15)
BUN: 11 mg/dL (ref 6–20)
CHLORIDE: 98 mmol/L — AB (ref 101–111)
CO2: 28 mmol/L (ref 22–32)
Calcium: 9.3 mg/dL (ref 8.9–10.3)
Creatinine, Ser: 1.15 mg/dL (ref 0.61–1.24)
GFR calc Af Amer: >60 mL/min (ref >60)
GFR calc non Af Amer: >60 mL/min (ref >60)
GLUCOSE: 96 mg/dL (ref 65–99)
POTASSIUM: 4.2 mmol/L (ref 3.5–5.1)
Sodium: 136 mmol/L (ref 135–145)

## 2015-09-01 LAB — I-STAT CG4 LACTIC ACID, ED
LACTIC ACID, VENOUS: 1.54 mmol/L (ref 0.5–2.0)
Lactic Acid, Venous: 1.12 mmol/L (ref 0.5–2.0)

## 2015-09-01 LAB — C-REACTIVE PROTEIN: CRP: 7.3 mg/dL — ABNORMAL HIGH (ref <1.0)

## 2015-09-01 LAB — SEDIMENTATION RATE: Sed Rate: 17 mm/hr — ABNORMAL HIGH (ref 0–16)

## 2015-09-01 MED ORDER — SODIUM CHLORIDE 0.9 % IV BOLUS (SEPSIS)
1000.0000 mL | Freq: Once | INTRAVENOUS | Status: AC
  Administered 2015-09-01: 1000 mL via INTRAVENOUS

## 2015-09-01 MED ORDER — DOXYCYCLINE HYCLATE 100 MG PO CAPS: 100.0000 mg | ORAL_CAPSULE | Freq: Two times a day (BID) | ORAL | Status: DC

## 2015-09-01 MED ORDER — HYDROMORPHONE HCL 1 MG/ML IJ SOLN
1.0000 mg | Freq: Once | INTRAMUSCULAR | Status: AC
  Administered 2015-09-01: 1 mg via INTRAVENOUS
  Filled 2015-09-01: qty 1

## 2015-09-01 MED ORDER — ONDANSETRON HCL 4 MG/2ML IJ SOLN
4.0000 mg | Freq: Once | INTRAMUSCULAR | Status: AC
  Administered 2015-09-01: 4 mg via INTRAVENOUS
  Filled 2015-09-01: qty 2

## 2015-09-01 NOTE — ED Provider Notes (Signed)
CSN: [id number]     Arrival date & time 09/01/15  0810 History   First MD Initiated Contact with Patient 09/01/15 [phone]     Chief Complaint  Patient presents with  . Post-op Problem  . Wound Check     (Consider location/radiation/quality/duration/timing/severity/associated sxs/prior Treatment) HPI   29 year old male who presents with back pain. History of lumbar disc surgery by Dr. Trenton Gammon on 08/21/15. Seen in ED on 08/26/15 for wound evaluation and increased pain, redness with fever. Felt to be inflammatory and post-op changes. Sent home. Continues to feel unwell at home with fever to 101.13F yesterday evening. Having night sweats and chills over past few days. Nausea but no vomiting. Saw yellow drainage from wound soaking bed overnight. Denies chest pain, but felt sob last night. No cough, congestion, abdominal pain, diarrhea, or urinary symptoms. No numbness or weakness. No urinary or bowel incontinence/retention.   Past Medical History  Diagnosis Date  . Tobacco dipper   . GERD (gastroesophageal reflux disease)   . ADHD (attention deficit hyperactivity disorder)   . Hx MRSA infection 8/08    right thigh  . S/P colonoscopy 11/10, 10/08    Dr [name]  . S/P endoscopy 10/08, 2/10, 5/10    Dr Raliegh Scarlet reflux esophagitis, small HH  . Occult GI bleeding 12/2008    Trivial upper GI bleed/uncontrolled GERD by upper endoscopy 12/2008, normal f/u endoscopy 03/2009 with SBCE at that time  . Bipolar 1 disorder ([place])   . Esophagitis     Distal esophageal erosions consistent with mild erosive  reflux esophagitis 12/2008 by EGD   . Thyroid function test abnormal     Noted in 2011 discharge  . Obesity   . Gastric ulcer   . Hypercholesteremia   . Hiatal hernia    Past Surgical History  Procedure Laterality Date  . Vasectomy    . Ankle surgery    . Small bowel capsule  04/11/2009     normal throughout  . Esophagogastroduodenoscopy   04/07/2009    Normal esophagus, small hiatal  hernia  . Esophagogastroduodenoscopy  01/09/2009    Distal esophageal erosions consistent with mild erosive reflux esophagitis, otherwise normal esophagus, small hiatal herniaotherwise normal stomach, D1-D2   . Ileocolonoscopy  08/26/2007     A normal rectum, colon, and terminal ileum  . Esophagogastroduodenoscopy  08/26/2007    Normal esophagus, a small hiatal/hernia, otherwise normal stomach D1 through D3  . Colonoscopy  10/10/2009    anal papilla otherwise normal  . Back surgery     Family History  Problem Relation Age of Onset  . Leukemia Father 36  . Seizures Mother   . Bipolar disorder Brother   . ADD / ADHD Brother   . Colon cancer Neg Hx    Social History  Substance Use Topics  . Smoking status: Never Smoker   . Smokeless tobacco: Current User    Types: Chew     Comment: DIPx 10 years  . Alcohol Use: 0.0 oz/week    0 Standard drinks or equivalent per week     Comment: Quit drinking 04/2015; used to drink on weekends    Review of Systems 10/14 systems reviewed and are negative other than those stated in the HPI    Allergies  Ibuprofen; Influenza vaccines; Tylenol; Bee venom; and Tramadol  Home Medications   Prior to Admission medications   Medication Sig Start Date End Date Taking? Authorizing Provider  citalopram (CELEXA) 20 MG tablet TAKE 1 TABLET (20  MG) BY MOUTH DAILY 04/21/15  Yes Historical Provider, MD  diazepam (VALIUM) 5 MG tablet Take 5 mg by mouth daily as needed. 08/21/15  Yes Historical Provider, MD  hydrOXYzine (VISTARIL) 25 MG capsule Take 1 capsule (25 mg total) by mouth 3 (three) times daily as needed for itching. 06/12/15  Yes Hope Bunnie Pion, NP  Oxcarbazepine (TRILEPTAL) 300 MG tablet Take 300 mg by mouth 2 (two) times daily.   Yes Historical Provider, MD  oxyCODONE (OXY IR/ROXICODONE) 5 MG immediate release tablet Take 5 mg by mouth daily as needed for moderate pain.  08/21/15  Yes Historical Provider, MD  pantoprazole (PROTONIX) 40 MG tablet Take 1  tablet (40 mg total) by mouth daily. 06/15/15 09/28/16 Yes Carlis Stable, NP  doxycycline (VIBRAMYCIN) 100 MG capsule Take 1 capsule (100 mg total) by mouth 2 (two) times daily. 09/01/15   Forde Dandy, MD   BP 108/63 mmHg  Pulse 99  Temp(Src) 98.6 F (37 C) (Oral)  Resp 16  Ht 6' (1.829 m)  Wt 380 lb (172.367 kg)  BMI 51.53 kg/m2  SpO2 93% Physical Exam Physical Exam  Nursing note and vitals reviewed. Constitutional: Well developed, well nourished, non-toxic, and mild distress due to pain. Head: Normocephalic and atraumatic.  Mouth/Throat: Oropharynx is clear and moist.  Neck: Normal range of motion. Neck supple.  Cardiovascular: Normal rate and regular rhythm.  No edema. Pulmonary/Chest: Effort normal and breath sounds normal.  Abdominal: Soft. There is no tenderness. There is no rebound and no guarding.  Musculoskeletal: Normal range of motion.  Neurological: Alert, no facial droop, fluent speech +2 patellar and achilles reflexes bilaterally Sensation to light touch in tact in bilateral lower extremities Full strength in ankle dorsi/plantarflexion and knee flexion/extension Skin: Surgical incision in low lumbar spine with mild surrounding erythema, minimal serous drainage Psychiatric: Cooperative  ED Course  Procedures (including critical care time) Labs Review Labs Reviewed  BASIC METABOLIC PANEL - Abnormal; Notable for the following:    Chloride 98 (*)    All other components within normal limits  CBC - Abnormal; Notable for the following:    WBC 12.6 (*)    All other components within normal limits  SEDIMENTATION RATE - Abnormal; Notable for the following:    Sed Rate 17 (*)    All other components within normal limits  C-REACTIVE PROTEIN - Abnormal; Notable for the following:    CRP 7.3 (*)    All other components within normal limits  URINALYSIS W MICROSCOPIC  I-STAT CG4 LACTIC ACID, ED  I-STAT CG4 LACTIC ACID, ED    Imaging Review Dg Chest 2 View  09/01/2015    CLINICAL DATA:  Lumbar disc surgery 08/21/2015, wound evaluation with pain, redness and fever, inflammatory postop changes, persistent fever and night sweats with chills over past few days, nausea without vomiting, yellow drainage from wound  EXAM: CHEST  2 VIEW  COMPARISON:  08/26/2015  FINDINGS: Enlargement of cardiac silhouette.  Mediastinal contours and pulmonary vascularity normal.  Questionably of infiltrate at RIGHT base.  Remaining lungs clear.  No pleural effusion or pneumothorax.  Osseous structures unremarkable.  IMPRESSION: Enlargement of cardiac silhouette.  Questionable focus of infiltrate RIGHT lung base.   Electronically Signed   By: Lavonia Graden Hoshino M.D.   On: 09/01/2015 10:08   I have personally reviewed and evaluated these images and lab results as part of my medical decision-making.   EKG Interpretation None      MDM   Final diagnoses:  Encounter for wound re-check  Midline low back pain without sciatica  Community acquired pneumonia    In short, this a 29 year old male status post lumbar disc surgery on September 26 by Dr. Annette Stable who presents with persistent back pain and fever, now with reported purulent drainage from his wound. He is well-appearing nontoxic in no acute distress on presentation. Afebrile with stable vital signs. On exam his incision in the low lumbar spine does show some mild surrounding erythema, currently without significant drainage. Area is tender to palpation. he is neurologically intact. No other infectious source for his fever at home, but does have questionable developing infiltrate in the right lower lobe on chest x-ray. Does report cough and shortness of breath.  Discussed with neurosurgery, who evaluated patient in ED. Wound not concerning for significant infection. Recommended starting antibiotics as outpatient. Continues to have leukocytosis of 12.9, but inflammatory markers down trending. Less concerned for disciti, osteomyelitis or abscess involving  spine. Started on doxycycline for treatment of possible PNA vs mild wound infection. Strict return and follow-up instructions reviewed. He expressed understanding of all discharge instructions and felt comfortable with the plan of care.    Forde Dandy, MD 09/01/15 [phone]

## 2015-09-01 NOTE — Discharge Instructions (Signed)
Please follow-up with your neurosurgeon as scheduled next week. Return for worsening symptoms including increased redness/swelling or drainage  of your wound, persistent fevers, worsening pain or any other symptoms concerning to you.  Back Pain, Adult Back pain is very common. The pain often gets better over time. The cause of back pain is usually not dangerous. Most people can learn to manage their back pain on their own.  HOME CARE  Watch your back pain for any changes. The following actions may help to lessen any pain you are feeling:  Stay active. Start with short walks on flat ground if you can. Try to walk farther each day.  Exercise regularly as told by your doctor. Exercise helps your back heal faster. It also helps avoid future injury by keeping your muscles strong and flexible.  Do not sit, drive, or stand in one place for more than 30 minutes.  Do not stay in bed. Resting more than 1-2 days can slow down your recovery.  Be careful when you bend or lift an object. Use good form when lifting:  Bend at your knees.  Keep the object close to your body.  Do not twist.  Sleep on a firm mattress. Lie on your side, and bend your knees. If you lie on your back, put a pillow under your knees.  Take medicines only as told by your doctor.  Put ice on the injured area.  Put ice in a plastic bag.  Place a towel between your skin and the bag.  Leave the ice on for 20 minutes, 2-3 times a day for the first 2-3 days. After that, you can switch between ice and heat packs.  Avoid feeling anxious or stressed. Find good ways to deal with stress, such as exercise.  Maintain a healthy weight. Extra weight puts stress on your back. GET HELP IF:   You have pain that does not go away with rest or medicine.  You have worsening pain that goes down into your legs or buttocks.  You have pain that does not get better in one week.  You have pain at night.  You lose weight.  You have a  fever or chills. GET HELP RIGHT AWAY IF:   You cannot control when you poop (bowel movement) or pee (urinate).  Your arms or legs feel weak.  Your arms or legs lose feeling (numbness).  You feel sick to your stomach (nauseous) or throw up (vomit).  You have belly (abdominal) pain.  You feel like you may pass out (faint).   This information is not intended to replace advice given to you by your health care provider. Make sure you discuss any questions you have with your health care provider.   Document Released: 04/29/2008 Document Revised: 12/02/2014 Document Reviewed: 03/15/2014 Elsevier Interactive Patient Education [employer].

## 2015-09-01 NOTE — ED Notes (Addendum)
Pt c/o yellow and green colored drainage coming from surgical area of back x 1-2 days, increased overnight. Pt had back surgery on the 26th. Pt also reports having chills.

## 2015-09-01 NOTE — ED Notes (Signed)
Pt given a urinal and ask to please try and provide a urine sample as that is the only test that is remaining.

## 2015-09-01 NOTE — ED Notes (Signed)
Neuro surgery at bedside.

## 2015-09-01 NOTE — ED Notes (Signed)
Family at bedside. 

## 2015-09-01 NOTE — Progress Notes (Signed)
No issues overnight. Pt comes in with back pain, fever at home, some drainage from wound. No new N/T/W. No bowel/bladder sx.  EXAM:  BP 116/52 mmHg  Pulse 84  Temp(Src) 98.6 F (37 C) (Oral)  Resp 16  Ht 6' (1.829 m)  Wt 172.367 kg (380 lb)  BMI 51.53 kg/m2  SpO2 97%  NAD Awake, alert, oriented  Speech fluent, appropriate  CN grossly intact  5/5 BUE/BLE  Wound minimal erythema, TTP. There is minimal serosanguinous drainage, no active purulent drainage, although there is drainage on the bed sheets.  IMPRESSION:  29 y.o. male s/p lumbar discectomy ~2 weeks ago with wound drainage. Does not appear toxic, minimally elevated WBC and essentially normal ESR although CRP is elevated. Wound does not look obviously infected but cannot r/o low-grade superficial infection. I don't think he needs to be taken for wound exploration/debridement/washout at this point.  PLAN: - Would recommend PO abx with Bactrim DS PO BID - F/U in Dr. Marchelle Folks office next week for wound check - Pt and wife know to call answering service or come to ed with new neurologic/septic sx.

## 2015-09-04 ENCOUNTER — Observation Stay (HOSPITAL_COMMUNITY): Payer: 59 | Admitting: Anesthesiology

## 2015-09-04 ENCOUNTER — Encounter (HOSPITAL_COMMUNITY): Payer: Self-pay | Admitting: Emergency Medicine

## 2015-09-04 ENCOUNTER — Encounter (HOSPITAL_COMMUNITY)
Admission: AD | Disposition: A | Discharge status: Home / Self Care | Payer: Self-pay | Source: Ambulatory Visit | Attending: Neurosurgery

## 2015-09-04 ENCOUNTER — Encounter (HOSPITAL_COMMUNITY): Payer: Self-pay | Admitting: General Practice

## 2015-09-04 ENCOUNTER — Observation Stay (HOSPITAL_COMMUNITY)
Admission: AD | Admit: 2015-09-04 | Discharge: 2015-09-07 | Disposition: A | Discharge status: Home / Self Care | Payer: 59 | Source: Ambulatory Visit | Attending: Neurosurgery | Admitting: Neurosurgery

## 2015-09-04 DIAGNOSIS — G4730 Sleep apnea, unspecified: Secondary | ICD-10-CM | POA: Insufficient documentation

## 2015-09-04 DIAGNOSIS — Z91030 Bee allergy status: Secondary | ICD-10-CM | POA: Insufficient documentation

## 2015-09-04 DIAGNOSIS — Z8614 Personal history of Methicillin resistant Staphylococcus aureus infection: Secondary | ICD-10-CM | POA: Diagnosis not present

## 2015-09-04 DIAGNOSIS — Y813 Surgical instruments, materials and general- and plastic-surgery devices (including sutures) associated with adverse incidents: Secondary | ICD-10-CM | POA: Diagnosis not present

## 2015-09-04 DIAGNOSIS — L24A9 Irritant contact dermatitis due friction or contact with other specified body fluids: Secondary | ICD-10-CM

## 2015-09-04 DIAGNOSIS — Z885 Allergy status to narcotic agent status: Secondary | ICD-10-CM | POA: Diagnosis not present

## 2015-09-04 DIAGNOSIS — K219 Gastro-esophageal reflux disease without esophagitis: Secondary | ICD-10-CM | POA: Insufficient documentation

## 2015-09-04 DIAGNOSIS — F17210 Nicotine dependence, cigarettes, uncomplicated: Secondary | ICD-10-CM | POA: Diagnosis not present

## 2015-09-04 DIAGNOSIS — Z6841 Body Mass Index (BMI) 40.0 and over, adult: Secondary | ICD-10-CM | POA: Diagnosis not present

## 2015-09-04 DIAGNOSIS — E7800 Pure hypercholesterolemia, unspecified: Secondary | ICD-10-CM | POA: Insufficient documentation

## 2015-09-04 DIAGNOSIS — Z886 Allergy status to analgesic agent status: Secondary | ICD-10-CM | POA: Diagnosis not present

## 2015-09-04 DIAGNOSIS — T8131XA Disruption of external operation (surgical) wound, not elsewhere classified, initial encounter: Secondary | ICD-10-CM | POA: Diagnosis not present

## 2015-09-04 DIAGNOSIS — Z887 Allergy status to serum and vaccine status: Secondary | ICD-10-CM | POA: Insufficient documentation

## 2015-09-04 DIAGNOSIS — Y838 Other surgical procedures as the cause of abnormal reaction of the patient, or of later complication, without mention of misadventure at the time of the procedure: Secondary | ICD-10-CM | POA: Insufficient documentation

## 2015-09-04 DIAGNOSIS — F319 Bipolar disorder, unspecified: Secondary | ICD-10-CM | POA: Insufficient documentation

## 2015-09-04 DIAGNOSIS — M9689 Other intraoperative and postprocedural complications and disorders of the musculoskeletal system: Secondary | ICD-10-CM | POA: Diagnosis present

## 2015-09-04 DIAGNOSIS — T148XXA Other injury of unspecified body region, initial encounter: Secondary | ICD-10-CM

## 2015-09-04 HISTORY — DX: Anxiety disorder, unspecified: F41.9

## 2015-09-04 HISTORY — DX: Pneumonia, unspecified organism: J18.9

## 2015-09-04 HISTORY — DX: Low back pain: M54.5

## 2015-09-04 HISTORY — DX: Major depressive disorder, single episode, unspecified: F32.9

## 2015-09-04 HISTORY — PX: LUMBAR WOUND DEBRIDEMENT: SHX1988

## 2015-09-04 HISTORY — DX: Schizophrenia, unspecified: F20.9

## 2015-09-04 HISTORY — DX: Obstructive sleep apnea (adult) (pediatric): G47.33

## 2015-09-04 HISTORY — PX: INCISION AND DRAINAGE: SHX5863

## 2015-09-04 HISTORY — DX: Other chronic pain: G89.29

## 2015-09-04 HISTORY — DX: Personal history of peptic ulcer disease: Z87.11

## 2015-09-04 HISTORY — DX: Dependence on other enabling machines and devices: Z99.89

## 2015-09-04 HISTORY — DX: Bipolar disorder, unspecified: F31.9

## 2015-09-04 HISTORY — DX: Personal history of other diseases of the digestive system: Z87.19

## 2015-09-04 HISTORY — DX: Depression, unspecified: F32.A

## 2015-09-04 HISTORY — DX: Low back pain, unspecified: M54.50

## 2015-09-04 LAB — CREATININE, SERUM
Creatinine, Ser: 0.99 mg/dL (ref 0.61–1.24)
GFR calc Af Amer: >60 mL/min (ref >60)

## 2015-09-04 LAB — CBC
HEMATOCRIT: 40.4 % (ref 39.0–52.0)
Hemoglobin: 13 g/dL (ref 13.0–17.0)
MCH: 27.7 pg (ref 26.0–34.0)
MCHC: 32.2 g/dL (ref 30.0–36.0)
MCV: 86 fL (ref 78.0–100.0)
Platelets: 341 10*3/uL (ref 150–400)
RBC: 4.7 MIL/uL (ref 4.22–5.81)
RDW: 13.1 % (ref 11.5–15.5)
WBC: 10.8 10*3/uL — ABNORMAL HIGH (ref 4.0–10.5)

## 2015-09-04 LAB — GRAM STAIN

## 2015-09-04 SURGERY — LUMBAR WOUND DEBRIDEMENT
Anesthesia: General

## 2015-09-04 MED ORDER — PROMETHAZINE HCL 25 MG/ML IJ SOLN
6.2500 mg | INTRAMUSCULAR | Status: AC | PRN
  Administered 2015-09-04: 6.25 mg via INTRAVENOUS
  Administered 2015-09-05: 12.5 mg via INTRAVENOUS
  Filled 2015-09-04: qty 1

## 2015-09-04 MED ORDER — HYDROXYZINE PAMOATE 25 MG PO CAPS
25.0000 mg | ORAL_CAPSULE | Freq: Three times a day (TID) | ORAL | Status: DC | PRN
Start: 2015-09-04 — End: 2015-09-06

## 2015-09-04 MED ORDER — PROPOFOL 10 MG/ML IV BOLUS
INTRAVENOUS | Status: AC
  Filled 2015-09-04: qty 20

## 2015-09-04 MED ORDER — SUCCINYLCHOLINE CHLORIDE 20 MG/ML IJ SOLN
INTRAMUSCULAR | Status: AC
Start: 2015-09-04 — End: 2015-09-04
  Filled 2015-09-04: qty 1

## 2015-09-04 MED ORDER — PROMETHAZINE HCL 25 MG/ML IJ SOLN
INTRAMUSCULAR | Status: AC
  Filled 2015-09-04: qty 1

## 2015-09-04 MED ORDER — PROPOFOL 10 MG/ML IV BOLUS
INTRAVENOUS | Status: DC | PRN
  Administered 2015-09-04: 20 mg via INTRAVENOUS
  Administered 2015-09-04: 180 mg via INTRAVENOUS

## 2015-09-04 MED ORDER — MORPHINE SULFATE (PF) 2 MG/ML IV SOLN
2.0000 mg | INTRAVENOUS | Status: DC | PRN
  Administered 2015-09-04 – 2015-09-07 (×20): 2 mg via INTRAVENOUS
  Filled 2015-09-04 (×21): qty 1

## 2015-09-04 MED ORDER — HEPARIN SODIUM (PORCINE) 5000 UNIT/ML IJ SOLN
5000.0000 [IU] | Freq: Three times a day (TID) | INTRAMUSCULAR | Status: DC
  Administered 2015-09-05 – 2015-09-07 (×7): 5000 [IU] via SUBCUTANEOUS
  Filled 2015-09-04 (×7): qty 1

## 2015-09-04 MED ORDER — DIAZEPAM 5 MG PO TABS
5.0000 mg | ORAL_TABLET | Freq: Three times a day (TID) | ORAL | Status: DC | PRN
  Administered 2015-09-04 – 2015-09-07 (×7): 5 mg via ORAL
  Filled 2015-09-04 (×7): qty 1

## 2015-09-04 MED ORDER — MIDAZOLAM HCL 2 MG/2ML IJ SOLN
INTRAMUSCULAR | Status: AC
  Filled 2015-09-04: qty 4

## 2015-09-04 MED ORDER — BISACODYL 10 MG RE SUPP: 10.0000 mg | Freq: Every day | RECTAL | Status: DC | PRN

## 2015-09-04 MED ORDER — HYDROMORPHONE HCL 1 MG/ML IJ SOLN
INTRAMUSCULAR | Status: AC
  Filled 2015-09-04: qty 1

## 2015-09-04 MED ORDER — MEPERIDINE HCL 25 MG/ML IJ SOLN: 6.2500 mg | INTRAMUSCULAR | Status: DC | PRN

## 2015-09-04 MED ORDER — SENNA 8.6 MG PO TABS
1.0000 | ORAL_TABLET | Freq: Two times a day (BID) | ORAL | Status: DC
  Administered 2015-09-04 – 2015-09-07 (×6): 8.6 mg via ORAL
  Filled 2015-09-04 (×6): qty 1

## 2015-09-04 MED ORDER — SUCCINYLCHOLINE CHLORIDE 20 MG/ML IJ SOLN
INTRAMUSCULAR | Status: DC | PRN
  Administered 2015-09-04: 160 mg via INTRAVENOUS

## 2015-09-04 MED ORDER — SODIUM CHLORIDE 0.9 % IV SOLN
INTRAVENOUS | Status: DC
  Administered 2015-09-04: 21:00:00 via INTRAVENOUS

## 2015-09-04 MED ORDER — ONDANSETRON HCL 4 MG/2ML IJ SOLN
INTRAMUSCULAR | Status: DC | PRN
  Administered 2015-09-04: 4 mg via INTRAVENOUS

## 2015-09-04 MED ORDER — SODIUM CHLORIDE 0.9 % IJ SOLN
INTRAMUSCULAR | Status: AC
  Filled 2015-09-04: qty 20

## 2015-09-04 MED ORDER — 0.9 % SODIUM CHLORIDE (POUR BTL) OPTIME
TOPICAL | Status: DC | PRN
  Administered 2015-09-04: 1000 mL

## 2015-09-04 MED ORDER — FENTANYL CITRATE (PF) 100 MCG/2ML IJ SOLN
INTRAMUSCULAR | Status: DC | PRN
  Administered 2015-09-04 (×5): 50 ug via INTRAVENOUS

## 2015-09-04 MED ORDER — LIDOCAINE HCL (CARDIAC) 20 MG/ML IV SOLN
INTRAVENOUS | Status: AC
  Filled 2015-09-04: qty 5

## 2015-09-04 MED ORDER — ROCURONIUM BROMIDE 50 MG/5ML IV SOLN
INTRAVENOUS | Status: AC
  Filled 2015-09-04: qty 1

## 2015-09-04 MED ORDER — STERILE WATER FOR INJECTION IJ SOLN
INTRAMUSCULAR | Status: AC
  Filled 2015-09-04: qty 10

## 2015-09-04 MED ORDER — GLYCOPYRROLATE 0.2 MG/ML IJ SOLN
INTRAMUSCULAR | Status: AC
  Filled 2015-09-04: qty 8

## 2015-09-04 MED ORDER — LIDOCAINE HCL (CARDIAC) 20 MG/ML IV SOLN
INTRAVENOUS | Status: DC | PRN
  Administered 2015-09-04: 80 mg via INTRAVENOUS

## 2015-09-04 MED ORDER — SODIUM CHLORIDE 0.9 % IR SOLN
Status: DC | PRN
  Administered 2015-09-04: 17:00:00

## 2015-09-04 MED ORDER — PHENOL 1.4 % MT LIQD
1.0000 | OROMUCOSAL | Status: DC | PRN
  Administered 2015-09-04: 1 via OROMUCOSAL
  Filled 2015-09-04: qty 177

## 2015-09-04 MED ORDER — DOCUSATE SODIUM 100 MG PO CAPS
100.0000 mg | ORAL_CAPSULE | Freq: Two times a day (BID) | ORAL | Status: DC
  Administered 2015-09-04 – 2015-09-07 (×6): 100 mg via ORAL
  Filled 2015-09-04 (×6): qty 1

## 2015-09-04 MED ORDER — PHENYLEPHRINE 40 MCG/ML (10ML) SYRINGE FOR IV PUSH (FOR BLOOD PRESSURE SUPPORT)
PREFILLED_SYRINGE | INTRAVENOUS | Status: AC
  Filled 2015-09-04: qty 20

## 2015-09-04 MED ORDER — CEFAZOLIN SODIUM-DEXTROSE 2-3 GM-% IV SOLR
INTRAVENOUS | Status: DC | PRN
  Administered 2015-09-04: 2 g via INTRAVENOUS

## 2015-09-04 MED ORDER — HYDROMORPHONE HCL 1 MG/ML IJ SOLN
0.2500 mg | INTRAMUSCULAR | Status: DC | PRN
Start: 2015-09-04 — End: 2015-09-07
  Administered 2015-09-04 (×4): 0.5 mg via INTRAVENOUS

## 2015-09-04 MED ORDER — LACTATED RINGERS IV SOLN: INTRAVENOUS | Status: DC

## 2015-09-04 MED ORDER — ONDANSETRON HCL 4 MG/2ML IJ SOLN
INTRAMUSCULAR | Status: AC
  Filled 2015-09-04: qty 2

## 2015-09-04 MED ORDER — OXYCODONE HCL 5 MG PO TABS
5.0000 mg | ORAL_TABLET | Freq: Every day | ORAL | Status: DC | PRN
  Administered 2015-09-04 – 2015-09-07 (×5): 5 mg via ORAL
  Filled 2015-09-04 (×5): qty 1

## 2015-09-04 MED ORDER — LACTATED RINGERS IV SOLN
INTRAVENOUS | Status: DC | PRN
  Administered 2015-09-04: 16:00:00 via INTRAVENOUS

## 2015-09-04 MED ORDER — ARTIFICIAL TEARS OP OINT
TOPICAL_OINTMENT | OPHTHALMIC | Status: AC
  Filled 2015-09-04: qty 14

## 2015-09-04 MED ORDER — CITALOPRAM HYDROBROMIDE 10 MG PO TABS
20.0000 mg | ORAL_TABLET | Freq: Every day | ORAL | Status: DC
  Administered 2015-09-05 – 2015-09-07 (×3): 20 mg via ORAL
  Filled 2015-09-04 (×3): qty 2

## 2015-09-04 MED ORDER — FENTANYL CITRATE (PF) 250 MCG/5ML IJ SOLN
INTRAMUSCULAR | Status: AC
  Filled 2015-09-04: qty 5

## 2015-09-04 MED ORDER — NEOSTIGMINE METHYLSULFATE 10 MG/10ML IV SOLN
INTRAVENOUS | Status: AC
Start: 2015-09-04 — End: 2015-09-04
  Filled 2015-09-04: qty 2

## 2015-09-04 MED ORDER — VECURONIUM BROMIDE 10 MG IV SOLR
INTRAVENOUS | Status: AC
  Filled 2015-09-04: qty 10

## 2015-09-04 MED ORDER — MIDAZOLAM HCL 5 MG/5ML IJ SOLN
INTRAMUSCULAR | Status: DC | PRN
  Administered 2015-09-04: 2 mg via INTRAVENOUS

## 2015-09-04 MED ORDER — OXCARBAZEPINE 300 MG PO TABS
300.0000 mg | ORAL_TABLET | Freq: Two times a day (BID) | ORAL | Status: DC
  Administered 2015-09-04 – 2015-09-07 (×6): 300 mg via ORAL
  Filled 2015-09-04 (×8): qty 1

## 2015-09-04 MED ORDER — PANTOPRAZOLE SODIUM 40 MG PO TBEC
40.0000 mg | DELAYED_RELEASE_TABLET | Freq: Every day | ORAL | Status: DC
  Administered 2015-09-05 – 2015-09-07 (×3): 40 mg via ORAL
  Filled 2015-09-04 (×4): qty 1

## 2015-09-04 SURGICAL SUPPLY — 41 items
ADH SKN CLS APL DERMABOND .7 (GAUZE/BANDAGES/DRESSINGS) ×2
BAG DECANTER FOR FLEXI CONT (MISCELLANEOUS) ×2 IMPLANT
CANISTER SUCT 3000ML PPV (MISCELLANEOUS) ×2 IMPLANT
DERMABOND ADVANCED (GAUZE/BANDAGES/DRESSINGS) ×2
DERMABOND ADVANCED .7 DNX12 (GAUZE/BANDAGES/DRESSINGS) IMPLANT
DRAPE LAPAROTOMY 100X72X124 (DRAPES) ×2 IMPLANT
DRAPE POUCH INSTRU U-SHP 10X18 (DRAPES) ×2 IMPLANT
DRSG OPSITE POSTOP 4X6 (GAUZE/BANDAGES/DRESSINGS) ×1 IMPLANT
ELECT REM PT RETURN 9FT ADLT (ELECTROSURGICAL) ×2
ELECTRODE REM PT RTRN 9FT ADLT (ELECTROSURGICAL) ×1 IMPLANT
EVACUATOR 1/8 PVC DRAIN (DRAIN) ×1 IMPLANT
GAUZE SPONGE 4X4 12PLY STRL (GAUZE/BANDAGES/DRESSINGS) IMPLANT
GAUZE SPONGE 4X4 16PLY XRAY LF (GAUZE/BANDAGES/DRESSINGS) IMPLANT
GLOVE BIOGEL PI IND STRL 7.5 (GLOVE) ×1 IMPLANT
GLOVE BIOGEL PI INDICATOR 7.5 (GLOVE) ×1
GLOVE ECLIPSE 7.0 STRL STRAW (GLOVE) ×2 IMPLANT
GLOVE EXAM NITRILE LRG STRL (GLOVE) IMPLANT
GLOVE EXAM NITRILE MD LF STRL (GLOVE) IMPLANT
GLOVE EXAM NITRILE XL STR (GLOVE) IMPLANT
GLOVE EXAM NITRILE XS STR PU (GLOVE) IMPLANT
GOWN STRL REUS W/ TWL LRG LVL3 (GOWN DISPOSABLE) ×1 IMPLANT
GOWN STRL REUS W/ TWL XL LVL3 (GOWN DISPOSABLE) IMPLANT
GOWN STRL REUS W/TWL 2XL LVL3 (GOWN DISPOSABLE) IMPLANT
GOWN STRL REUS W/TWL LRG LVL3 (GOWN DISPOSABLE) ×2
GOWN STRL REUS W/TWL XL LVL3 (GOWN DISPOSABLE)
KIT BASIN OR (CUSTOM PROCEDURE TRAY) ×2 IMPLANT
KIT ROOM TURNOVER OR (KITS) ×2 IMPLANT
NEEDLE HYPO 22GX1.5 SAFETY (NEEDLE) ×1 IMPLANT
NS IRRIG 1000ML POUR BTL (IV SOLUTION) ×2 IMPLANT
PACK LAMINECTOMY NEURO (CUSTOM PROCEDURE TRAY) ×2 IMPLANT
PAD ARMBOARD 7.5X6 YLW CONV (MISCELLANEOUS) ×6 IMPLANT
SPONGE SURGIFOAM ABS GEL SZ50 (HEMOSTASIS) IMPLANT
SUT ETHILON 3 0 FSL (SUTURE) ×1 IMPLANT
SUT VIC AB 0 CT1 18XCR BRD8 (SUTURE) ×1 IMPLANT
SUT VIC AB 0 CT1 8-18 (SUTURE) ×2
SUT VICRYL 3-0 RB1 18 ABS (SUTURE) ×4 IMPLANT
SWAB COLLECTION DEVICE MRSA (MISCELLANEOUS) ×1 IMPLANT
TOWEL OR 17X24 6PK STRL BLUE (TOWEL DISPOSABLE) ×2 IMPLANT
TOWEL OR 17X26 10 PK STRL BLUE (TOWEL DISPOSABLE) ×2 IMPLANT
TUBE ANAEROBIC SPECIMEN COL (MISCELLANEOUS) ×1 IMPLANT
WATER STERILE IRR 1000ML POUR (IV SOLUTION) ×2 IMPLANT

## 2015-09-04 NOTE — Procedures (Signed)
Pt placed on CPAP with home settings.  Pt is resting well and tolerating nicely.

## 2015-09-04 NOTE — Anesthesia Preprocedure Evaluation (Addendum)
Anesthesia Evaluation  Patient identified by MRN, date of birth, ID band Patient awake    Reviewed: Allergy & Precautions, NPO status , Patient's Chart, lab work & pertinent test results  Airway Mallampati: I  TM Distance: >3 FB Neck ROM: Full    Dental  (+) Teeth Intact   Pulmonary sleep apnea ,    breath sounds clear to auscultation       Cardiovascular negative cardio ROS   Rhythm:Regular Rate:Normal     Neuro/Psych PSYCHIATRIC DISORDERS Bipolar Disorder    GI/Hepatic Neg liver ROS, GERD  Medicated,  Endo/Other  negative endocrine ROS  Renal/GU negative Renal ROS  negative genitourinary   Musculoskeletal negative musculoskeletal ROS (+)   Abdominal   Peds negative pediatric ROS (+)  Hematology negative hematology ROS (+)   Anesthesia Other Findings   Reproductive/Obstetrics negative OB ROS                            Lab Results  Component Value Date   WBC 12.6* 09/01/2015   HGB 15.0 09/01/2015   HCT 44.7 09/01/2015   MCV 85.8 09/01/2015   PLT 332 09/01/2015   Lab Results  Component Value Date   CREATININE 1.15 09/01/2015   BUN 11 09/01/2015   NA 136 09/01/2015   K 4.2 09/01/2015   CL 98* 09/01/2015   CO2 28 09/01/2015   Lab Results  Component Value Date   INR 1.02 03/01/2010   EKG: normal sinus rhythm.    Anesthesia Physical Anesthesia Plan  ASA: III  Anesthesia Plan: General   Post-op Pain Management:    Induction: Intravenous  Airway Management Planned: Oral ETT  Additional Equipment:   Intra-op Plan:   Post-operative Plan: Extubation in OR  Informed Consent: I have reviewed the patients History and Physical, chart, labs and discussed the procedure including the risks, benefits and alternatives for the proposed anesthesia with the patient or authorized representative who has indicated his/her understanding and acceptance.   Dental advisory  given  Plan Discussed with: CRNA  Anesthesia Plan Comments:         Anesthesia Quick Evaluation

## 2015-09-04 NOTE — ED Notes (Signed)
Pt c/o back pain after back sx; pt sts hx of infection in surgical site; pt sts told to come back to have area opened up; pt sts continued pain

## 2015-09-04 NOTE — Anesthesia Procedure Notes (Signed)
Procedure Name: Intubation Date/Time: 09/04/2015 4:58 PM Performed by: Clearnce Sorrel Pre-anesthesia Checklist: Patient identified, Timeout performed, Emergency Drugs available, Suction available and Patient being monitored Patient Re-evaluated:Patient Re-evaluated prior to inductionOxygen Delivery Method: Circle system utilized Preoxygenation: Pre-oxygenation with 100% oxygen Intubation Type: IV induction Laryngoscope Size: Glidescope and 4 Grade View: Grade I Tube type: Oral Tube size: 7.5 mm Number of attempts: 1 Airway Equipment and Method: Stylet Placement Confirmation: ETT inserted through vocal cords under direct vision,  breath sounds checked- equal and bilateral and positive ETCO2 Secured at: 24 cm Tube secured with: Tape Dental Injury: Teeth and Oropharynx as per pre-operative assessment

## 2015-09-04 NOTE — Transfer of Care (Signed)
Immediate Anesthesia Transfer of Care Note  Patient: [name]  Procedure(s) Performed: Procedure(s): LUMBAR WOUND DEBRIDEMENT (N/A)  Patient Location:    Anesthesia Type:General  Level of Consciousness: awake, alert  and oriented  Airway & Oxygen Therapy: Patient Spontanous Breathing and Patient connected to nasal cannula oxygen  Post-op Assessment: Report given to RN and Post -op Vital signs reviewed and stable  Post vital signs: Reviewed and stable  Last Vitals:  Filed Vitals:   09/04/15 1518  BP: 127/85  Pulse: 79  Temp: 36.9 C  Resp: 20    Complications: No apparent anesthesia complications

## 2015-09-04 NOTE — Progress Notes (Signed)
Admission nurse will complete admission this evening after patient returns from OR. Wendee Copp

## 2015-09-04 NOTE — H&P (Signed)
CC:  Wound drainage  HPI: [name] is a 29 y.o. male who recently underwent lumbar microdiscectomy on 08/21/15 by Dr. Annette Stable. Postop, he initially had minimal wound drainage which slowly began to increase. He was seen in the office and subsequently presented to the ED 3 days ago. At that time he did not appear toxic, and was discharged with PO abx. Over the weekend he has had continued increase in drainage with persistent severe back pain. He has no new neurologic symptoms.  PMH: Past Medical History  Diagnosis Date  . Tobacco dipper   . GERD (gastroesophageal reflux disease)   . ADHD (attention deficit hyperactivity disorder)   . Hx MRSA infection 8/08    right thigh  . S/P colonoscopy 11/10, 10/08    Dr [name]  . S/P endoscopy 10/08, 2/10, 5/10    Dr Raliegh Scarlet reflux esophagitis, small HH  . Occult GI bleeding 12/2008    Trivial upper GI bleed/uncontrolled GERD by upper endoscopy 12/2008, normal f/u endoscopy 03/2009 with SBCE at that time  . Bipolar 1 disorder ([place])   . Esophagitis     Distal esophageal erosions consistent with mild erosive  reflux esophagitis 12/2008 by EGD   . Thyroid function test abnormal     Noted in 2011 discharge  . Obesity   . Gastric ulcer   . Hypercholesteremia   . Hiatal hernia     PSH: Past Surgical History  Procedure Laterality Date  . Vasectomy    . Ankle surgery    . Small bowel capsule  04/11/2009     normal throughout  . Esophagogastroduodenoscopy   04/07/2009    Normal esophagus, small hiatal hernia  . Esophagogastroduodenoscopy  01/09/2009    Distal esophageal erosions consistent with mild erosive reflux esophagitis, otherwise normal esophagus, small hiatal herniaotherwise normal stomach, D1-D2   . Ileocolonoscopy  08/26/2007     A normal rectum, colon, and terminal ileum  . Esophagogastroduodenoscopy  08/26/2007    Normal esophagus, a small hiatal/hernia, otherwise normal stomach D1 through D3  . Colonoscopy   10/10/2009    anal papilla otherwise normal  . Back surgery      SH: Social History  Substance Use Topics  . Smoking status: Never Smoker   . Smokeless tobacco: Current User    Types: Chew     Comment: DIPx 10 years  . Alcohol Use: 0.0 oz/week    0 Standard drinks or equivalent per week     Comment: Quit drinking 04/2015; used to drink on weekends    MEDS: Prior to Admission medications   Medication Sig Start Date End Date Taking? Authorizing Provider  citalopram (CELEXA) 20 MG tablet TAKE 1 TABLET (20 MG) BY MOUTH DAILY 04/21/15   Historical Provider, MD  diazepam (VALIUM) 5 MG tablet Take 5 mg by mouth daily as needed. 08/21/15   Historical Provider, MD  doxycycline (VIBRAMYCIN) 100 MG capsule Take 1 capsule (100 mg total) by mouth 2 (two) times daily. 09/01/15   Forde Dandy, MD  hydrOXYzine (VISTARIL) 25 MG capsule Take 1 capsule (25 mg total) by mouth 3 (three) times daily as needed for itching. 06/12/15   Hope Bunnie Pion, NP  Oxcarbazepine (TRILEPTAL) 300 MG tablet Take 300 mg by mouth 2 (two) times daily.    Historical Provider, MD  oxyCODONE (OXY IR/ROXICODONE) 5 MG immediate release tablet Take 5 mg by mouth daily as needed for moderate pain.  08/21/15   Historical Provider, MD  pantoprazole (PROTONIX) 40 MG  tablet Take 1 tablet (40 mg total) by mouth daily. 06/15/15 09/28/16  Carlis Stable, NP    ALLERGY: Allergies  Allergen Reactions  . Ibuprofen Other (See Comments)    Makes ulcers bleed  . Influenza Vaccines Shortness Of Breath    Rash and unable to breathe well  . Tylenol [Acetaminophen] Other (See Comments)    Bleeding ulcers  . Bee Venom Swelling  . Tramadol Itching    ROS: ROS  NEUROLOGIC EXAM: Awake, alert, oriented Memory and concentration grossly intact Speech fluent, appropriate CN grossly intact Motor exam: Upper Extremities Deltoid Bicep Tricep Grip  Right 5/5 5/5 5/5 5/5  Left 5/5 5/5 5/5 5/5   Lower Extremity IP Quad PF DF EHL  Right 5/5 5/5 5/5 5/5  5/5  Left 5/5 5/5 5/5 5/5 5/5   Sensation grossly intact to LT   Wound is minimally erythematous, with multiple areas that appear to be superficially dehiscent. Wound is TTP. There is some thin serosanguinous drainage.  IMPRESSION: - 29 y.o. male 2 weeks s/p left L5-S1 microdiscectomy with persistent, worsening wound drainage despite abx therapy.  PLAN: - Will plan on operative debridement, washout, and closure of wound.  I have reviewed treatment options with the patient. Risks, benefits, and alternatives to treatment were reviewed. All questions were answered and consent was obtained.

## 2015-09-05 ENCOUNTER — Encounter (HOSPITAL_COMMUNITY): Payer: Self-pay | Admitting: Neurosurgery

## 2015-09-05 DIAGNOSIS — T8131XA Disruption of external operation (surgical) wound, not elsewhere classified, initial encounter: Secondary | ICD-10-CM | POA: Diagnosis not present

## 2015-09-05 LAB — MRSA PCR SCREENING: MRSA by PCR: NEGATIVE

## 2015-09-05 NOTE — Op Note (Addendum)
PREOP DIAGNOSIS: Wound drainage   POSTOP DIAGNOSIS: Same  PROCEDURE: 1. Exploration of wound, debridement, closure  SURGEON: Dr. Consuella Lose, MD  ASSISTANT: None  ANESTHESIA: General Endotracheal  EBL: 100cc  SPECIMENS: Culture swabs  DRAINS: Suprafacial hemovac  COMPLICATIONS: None immediate  CONDITION: Stable to PACU  HISTORY: [name] is a 29 y.o. male who recently underwent left L5-S1 microdiscectomy. He was seen postop with mild wound drainage, but re-presented to the ED with continued worsening drainage despite PO abx. He therefore elected to proceed with wound washout.  PROCEDURE IN DETAIL: After informed consent was obtained and witnessed, the patient was brought to the operating room. After induction of general anesthesia, the patient was positioned on the operative table in the prone position. All pressure points were meticulously padded. Skin incision was then marked out and prepped and draped in the usual sterile fashion.  After timeout was conducted, the previous wound was opened. There was bloody yellowish fluid encountered, not clearly purulent. This was cultured. The lumbodorsal fascia was identified and found to be completely intact, without evidence of fluid tracking beneath this layer. The subcutaneous fat in the wound was found to be dusky, likely necrotic. All this necrotic tissue was debrided. The wound was then irrigated with copious antibiotic and normal saline. Hemovac was placed and tunneled subcutaneously. The wound was then closed in layers with 0 and 3-0 vicry. Dermabond was placed. The drain was secured.  At the end of the case all sponge, needle, and instrument counts were correct. The patient was transferred to the stretcher, extubated, and taken to the PACU in stable condition.

## 2015-09-05 NOTE — Care Management Note (Signed)
Case Management Note  Patient Details  Name: [name] MRN: [id number] Date of Birth: 1986-01-21  Subjective/Objective:                    Action/Plan: Patient admitted with wound drainage and underwent a debridement. Pt is from home with his spouse. CM will continue to follow for discharge needs.   Expected Discharge Date:                  Expected Discharge Plan:  Home/Self Care  In-House Referral:     Discharge planning Services     Post Acute Care Choice:    Choice offered to:     DME Arranged:    DME Agency:     HH Arranged:    HH Agency:     Status of Service:  In process, will continue to follow  Medicare Important Message Given:    Date Medicare IM Given:    Medicare IM give by:    Date Additional Medicare IM Given:    Additional Medicare Important Message give by:     If discussed at [place] of Stay Meetings, dates discussed:    Additional Comments:  Pollie Friar, RN 09/05/2015, 3:08 PM

## 2015-09-05 NOTE — Anesthesia Postprocedure Evaluation (Signed)
  Anesthesia Post-op Note  Patient: [name]  Procedure(s) Performed: Procedure(s): LUMBAR WOUND DEBRIDEMENT (N/A)  Patient Location: PACU  Anesthesia Type:General  Level of Consciousness: awake, alert  and oriented  Airway and Oxygen Therapy: Patient Spontanous Breathing  Post-op Pain: mild  Post-op Assessment: Post-op Vital signs reviewed and Patient's Cardiovascular Status Stable LLE Motor Response: Purposeful movement   RLE Motor Response: Purposeful movement        Post-op Vital Signs: Reviewed  Last Vitals:  Filed Vitals:   09/05/15 1050  BP: 107/50  Pulse: 73  Temp: 36.9 C  Resp: 20    Complications: No apparent anesthesia complications

## 2015-09-05 NOTE — Progress Notes (Signed)
Patient admitted with lumbar wound dehiscence secondary to presumed fat necrosis. No evidence of CSF leak or active infection. Wound debridement and reclosed. Patient currently doing reasonably well. Still with complaints of back pain. No radicular pain. Up ambulating without difficulty. Cultures negative. Wound clean and dry. Straight leg raising negative bilaterally. Motor and sensory exam intact.  Difficult wound healing secondary to patient's extreme morbid obesity with secondary subcutaneous fat necrosis status post lumbar wound I and D. Continue subcutane drain and antibiotics. Probable discharge home tomorrow.

## 2015-09-06 DIAGNOSIS — T8131XA Disruption of external operation (surgical) wound, not elsewhere classified, initial encounter: Secondary | ICD-10-CM | POA: Diagnosis not present

## 2015-09-06 MED ORDER — OXYCODONE HCL ER 10 MG PO T12A
20.0000 mg | EXTENDED_RELEASE_TABLET | Freq: Two times a day (BID) | ORAL | Status: DC
  Administered 2015-09-06 – 2015-09-07 (×3): 20 mg via ORAL
  Filled 2015-09-06 (×3): qty 2

## 2015-09-06 MED ORDER — DOXYCYCLINE HYCLATE 100 MG PO TABS
100.0000 mg | ORAL_TABLET | Freq: Two times a day (BID) | ORAL | Status: DC
  Administered 2015-09-06 – 2015-09-07 (×3): 100 mg via ORAL
  Filled 2015-09-06 (×3): qty 1

## 2015-09-06 MED ORDER — HYDROXYZINE HCL 25 MG PO TABS
25.0000 mg | ORAL_TABLET | Freq: Three times a day (TID) | ORAL | Status: DC | PRN
  Administered 2015-09-06: 25 mg via ORAL
  Filled 2015-09-06: qty 1

## 2015-09-06 NOTE — Progress Notes (Signed)
Patient refusing CPAP states he cannot tolerate our mask.

## 2015-09-06 NOTE — Progress Notes (Signed)
Pain control continues to be difficult. Patient mobilizing but still not thriving. Wound looks good. Drain output minimal. Neurologically intact. He is afebrile. He does not appear toxic. Culture this morning did show some scant growth of staph aureus however this is difficult to interpret as the wound was open prior to being swabbed and would be expected to have some bacterial growth. Certainly nothing perioperatively suggest significant infection. I will add doxycycline at this time. Continue efforts at pain control mobilization. Plan discharge tomorrow.

## 2015-09-07 DIAGNOSIS — T8131XA Disruption of external operation (surgical) wound, not elsewhere classified, initial encounter: Secondary | ICD-10-CM | POA: Diagnosis not present

## 2015-09-07 LAB — WOUND CULTURE

## 2015-09-07 MED ORDER — OXYCODONE HCL ER 20 MG PO T12A: 20.0000 mg | EXTENDED_RELEASE_TABLET | Freq: Two times a day (BID) | ORAL | Status: DC

## 2015-09-07 MED ORDER — DOXYCYCLINE HYCLATE 100 MG PO CAPS: 100.0000 mg | ORAL_CAPSULE | Freq: Two times a day (BID) | ORAL | Status: DC

## 2015-09-07 MED ORDER — DIAZEPAM 5 MG PO TABS: 5.0000 mg | ORAL_TABLET | Freq: Four times a day (QID) | ORAL | Status: DC | PRN

## 2015-09-07 MED ORDER — OXYCODONE HCL 5 MG PO TABS: 5.0000 mg | ORAL_TABLET | Freq: Four times a day (QID) | ORAL | Status: DC | PRN

## 2015-09-07 NOTE — Progress Notes (Signed)
Patient discharged home with wife. Discharge education and prescriptions given. All questions answered. Patient belongings were sent with wife. IV taken out.

## 2015-09-07 NOTE — Discharge Summary (Signed)
Physician Discharge Summary  Patient ID: [name] MRN: [id number] DOB/AGE: May 26, 1986 29 y.o.  Admit date: 09/04/2015 Discharge date: 09/07/2015  Admission Diagnoses:  Discharge Diagnoses:  Active Problems:   Wound drainage   Discharged Condition: good  Hospital Course: The patient was admitted to the hospital for evaluation of superficial wound dehiscence with persistent drainage. Patient was taken to the operating room where his wound was reexplored. Findings at time of surgery were that of fat necrosis but no active infection. Patient's wound was washed out. He was placed in the hospital with an external drainage tube and observed. He continues to have quite a bit of back pain. He has some intermittent headaches. He is having no lower extremity pain. He is ambulating without difficulty. Pain level is difficult to control but has been improved after starting long-acting narcotics. Patient's morbid obesity significantly impacts our ability to care for him. At this point my main concern is the possibility that the patient could have some element of an occult CSF leak. Currently he is not showing signs or symptoms from this. Plan is for him to discharge home on light activity. Follow up with me in one week. He is to remain on prophylactic doxycycline.  Consults:   Significant Diagnostic Studies:   Treatments:   Discharge Exam: Blood pressure 122/68, pulse 85, temperature 98 F (36.7 C), temperature source Oral, resp. rate 20, height 6' (1.829 m), weight 173 kg (381 lb 6.3 oz), SpO2 96 %. Awake and alert. Oriented and appropriate. Cranial nerve function intact. Motor and sensory function in the extremities normal. No pain with straight leg raising. Wound healing well. Some serous drainage from the drain site which was oversewn today. Chest and abdomen benign. Extremities free from injury or deformity.  Disposition: 01-Home or Self Care     Medication List    TAKE these  medications        citalopram 20 MG tablet  Commonly known as:  CELEXA  Take 20 mg by mouth at bedtime.     diazepam 5 MG tablet  Commonly known as:  VALIUM  Take 1 tablet (5 mg total) by mouth every 6 (six) hours as needed for muscle spasms.     doxycycline 100 MG capsule  Commonly known as:  VIBRAMYCIN  Take 1 capsule (100 mg total) by mouth 2 (two) times daily.     hydrOXYzine 25 MG capsule  Commonly known as:  VISTARIL  Take 1 capsule (25 mg total) by mouth 3 (three) times daily as needed for itching.     Oxcarbazepine 300 MG tablet  Commonly known as:  TRILEPTAL  Take 300 mg by mouth 2 (two) times daily.     oxyCODONE 5 MG immediate release tablet  Commonly known as:  Oxy IR/ROXICODONE  Take 1 tablet (5 mg total) by mouth every 6 (six) hours as needed for moderate pain.     OxyCODONE 20 mg T12a 12 hr tablet  Commonly known as:  OXYCONTIN  Take 1 tablet (20 mg total) by mouth every 12 (twelve) hours.     pantoprazole 40 MG tablet  Commonly known as:  PROTONIX  Take 1 tablet (40 mg total) by mouth daily.           Follow-up Information    Follow up with Alegandra Sommers A, MD. Schedule an appointment as soon as possible for a visit in 1 week.   Specialty:  Neurosurgery   Contact information:   [address]  [place] [address] [phone]       Signed: Charlie Pitter 09/07/2015, 8:34 AM

## 2015-09-09 LAB — ANAEROBIC CULTURE

## 2015-09-15 ENCOUNTER — Ambulatory Visit (INDEPENDENT_AMBULATORY_CARE_PROVIDER_SITE_OTHER): Payer: 59 | Admitting: Internal Medicine

## 2015-09-15 ENCOUNTER — Encounter: Payer: Self-pay | Admitting: Internal Medicine

## 2015-09-15 VITALS — BP 126/76 | HR 85 | Ht 69.0 in | Wt 394.4 lb

## 2015-09-15 DIAGNOSIS — G4733 Obstructive sleep apnea (adult) (pediatric): Secondary | ICD-10-CM | POA: Diagnosis not present

## 2015-09-15 DIAGNOSIS — F319 Bipolar disorder, unspecified: Secondary | ICD-10-CM

## 2015-09-15 NOTE — Patient Instructions (Signed)
For dryness- ask the doctor who treats your bipolar disorder if it would be ok to try off vistaril, or at least a lower dose. See if that helps.  Consider trying otc nasal saline gel in your nose at bedtime for dryness.  Consider trying otc Biotene mouth rinse at bedtime  Consider trying lower settings on your CPAP humidifier to see if you can find a comfortable way to manage the humidifier.

## 2015-09-15 NOTE — Assessment & Plan Note (Signed)
He will ask his provider if he could try a lower dose of Vistaril to see if that reduces complaints of mouth dryness.

## 2015-09-15 NOTE — Assessment & Plan Note (Signed)
He is focused now on trying to heal from lumbosacral spine surgery and is not prepared to discuss bariatric management. That should be an option in the future.

## 2015-09-15 NOTE — Assessment & Plan Note (Signed)
He is still bothered by dry mouth. There daughter sleeps in the same room and runs a cool mist humidifier. The home care company is worked with him with his machine. I suggested he try saline gel or Biotene.

## 2015-09-15 NOTE — Progress Notes (Signed)
07/14/12- 78 yoM nonsmoker referred courtesy of Dr Rayann Heman for evaluation of sleep complaints.  He has a hx of loud snoring and daytime somnolence, with no ENT surgery. A recent ER visit for chest pain ruled out cardiac. While there he was witnessed having apneic episodes. Wife notes loud snoring. He admits history of easy daytime sleepiness if sits quietly. Was treated with Ritalin in the past but not in recent years. Denies hypnagogic hallucinations, sleep paralysis cataplexy by my description.   bedtime between 10 and 11 PM, sleep latency one hour, wakes 3 times, and is rolling over, before up at 8 AM. History of bipolar disorder, ADHD but no cardiopulmonary disease and no ENT surgery. 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  08/20/12- 77 yoM nonsmoker followed for obstructive sleep apnea complicated by history of bipolar disorder, GERD, obesity He recognizes no changes since last visit. NPSG 08/03/12-  AHI 24.1/ hr, moderate OSA. Discussed treatment options   10/01/12- 26 yoM nonsmoker followed for obstructive sleep apnea complicated by history of bipolar disorder, GERD, obesity Review CPAP usage;pt wears CPAP every night for about 7 hours; still moves alot during sleep Had flu vax. Using CPAP 17/ Advanced all night, every night. Changed from auto to fixed pressure 17 with god control documented. Full face mask and humidifier.  6/18/ [age] yoM nonsmoker followed for obstructive sleep apnea complicated by history of bipolar disorder, GERD, obesity FOLLOWS FOR: wears CPAP/ 17/ Advanced every night for at least 6 hours; Can tell if he hasnt slept with it-worn out and snores.   05/12/14- 71 yoM nonsmoker followed for obstructive sleep apnea complicated by history of bipolar disorder, GERD, obesity CPAP/ 17/ Advanced   FOLLOWS FOR: when wearing CPAP gets about 8 hours-in the past 6 months has worn about 8 times-works so much and lays down without it. DME is [facility].  Says he is exhausted when he gets home from work and lies down without his CPAP-discussed. Eyes itch, stuffy nose "allergy"  05/15/15- 28 yoM nonsmoker followed for obstructive sleep apnea, allergic rinitis complicated by history of bipolar disorder, GERD, obesity  wife here FOLLOWS FOR: wears CPAP 17/ Advanced every night-[facility]; DL attached. Having allergy flare up-watery eyes, white spots in nose(dry at times)-spends alot of time outside. Download indicates adequate use and good control at 17. Wife reports he snores through CPAP at times. She is having to refill humidifier during the night. No obvious leak. Full face mask.  09/15/15- 29 yoM nonsmoker followed for obstructive sleep apnea, allergic rinitis complicated by history of bipolar disorder, GERD, obesity  wife here CPAP 17/ Advanced every night-[facility] Has had multiple interventions for wound dehiscence following lumbar discectomy/ NSGY FOLLOWS FOR: Pt using his CPAP machine; having trouble with humidifier still-runs out of H2O too soon and has burning smell. DME is [place] location. Machine not yet old enough to qual for replacent. Complains of dry mouth. He is taking Vistaril for his bipolar disorder and we discussed antihistamine effect. He says the pressure is good at 20. Wife confirms he does not snore through CPAP. Main sleep disturbance recently has been back pain related to lumbosacral spine surgery with delayed healing.  ROS-see HPI Constitutional:   No-   weight loss, night sweats, fevers, chills, fatigue, lassitude. HEENT:   No-  headaches, difficulty swallowing, tooth/dental problems, sore throat,       No-  sneezing, itching, ear ache, +nasal congestion, post nasal drip,  CV:  No-  cardiac  chest pain, orthopnea, PND, swelling in lower extremities, anasarca,  dizziness, palpitations Resp: + shortness of breath with exertion or at rest.              No-   productive cough,  No non-productive cough,  No- coughing up of blood.               No-   change in color of mucus.  No- wheezing.   Skin: No-   rash or lesions. GI:  + heartburn, indigestion, no-abdominal pain, nausea, vomiting,  GU:  MS:  No-   joint pain or swelling. + Back pain Neuro-     nothing unusual Psych:   See HPI-No- change in mood or affect. + depression or anxiety.  No memory loss.  OBJ- Physical Exam General- Alert, Oriented, Affect-appropriate, Distress- none acute, +obese/ big Skin- rash-none, lesions- none, excoriation- none Lymphadenopathy- none Head- atraumatic            Eyes- Gross vision intact, PERRLA, conjunctivae and secretions clear            Ears- Hearing, canals-normal            Nose- Clear, no-Septal dev, mucus, polyps, erosion, perforation             Throat- Mallampati III , mucosa-brown stained, not dry , drainage- none, tonsils- 2+ Neck- flexible , trachea midline, no stridor , thyroid nl, carotid no bruit Chest - symmetrical excursion , unlabored           Heart/CV- RRR , no murmur , no gallop  , no rub, nl s1 s2                           - JVD- none , edema- none, stasis changes- none, varices- none           Lung- clear to P&A, wheeze- none, cough- none , dullness-none, rub- none           Chest wall-  Abd-  Br/ Gen/ Rectal- Not done, not indicated Extrem- cyanosis- none, clubbing, none, atrophy- none, strength- nl Neuro- grossly intact to observation

## 2015-11-02 ENCOUNTER — Telehealth: Payer: Self-pay | Admitting: Internal Medicine

## 2015-11-02 NOTE — Telephone Encounter (Signed)
Patient's wife returned call, may be reached at [phone].

## 2015-11-02 NOTE — Telephone Encounter (Signed)
[id number]

## 2015-11-02 NOTE — Telephone Encounter (Signed)
Spoke with spouse. She reports pt saw psychiatrists a couple of weeks ago. He suggested pt have another sleep study done to test for narcolepsy and for his sleep apnea again. Pt does have a CPAP but still has diff sleeping at night and has always had trouble with his pressure settings.  Please advise Dr. Annamaria Boots thanks

## 2015-11-03 NOTE — Telephone Encounter (Signed)
Dr. Young, please advise. Thank you!

## 2015-11-07 NOTE — Telephone Encounter (Signed)
CY - your next available is not until 02/14/16. Is this okay?

## 2015-11-07 NOTE — Telephone Encounter (Signed)
I will need to talk about this with Mr Vredeveld - please schedule for a routine ov slot as available.

## 2015-11-07 NOTE — Telephone Encounter (Signed)
Dr. Young, please advise. 

## 2015-11-10 NOTE — Telephone Encounter (Signed)
CY - please advise. Thanks! 

## 2015-11-10 NOTE — Telephone Encounter (Signed)
[name]- We need to discuss f/u appt for [name]

## 2015-11-13 NOTE — Telephone Encounter (Signed)
Please have patient come in on Tuesday 11/14/15 at 9:45am or 10:00am. If not able to come on Tuesday please have patient come in on Thursday 11/16/15 at 10:30am or 1:30pm. Thanks

## 2015-11-13 NOTE — Telephone Encounter (Signed)
Per Joellen Jersey - added patient to schedule on 12/20 at 9:45am Patient aware of appointment. Nothing further needed. Closing encounter

## 2015-11-14 ENCOUNTER — Ambulatory Visit (INDEPENDENT_AMBULATORY_CARE_PROVIDER_SITE_OTHER): Payer: Self-pay | Admitting: Internal Medicine

## 2015-11-14 ENCOUNTER — Encounter: Payer: Self-pay | Admitting: Internal Medicine

## 2015-11-14 VITALS — BP 122/76 | HR 89 | Ht 69.0 in | Wt 398.0 lb

## 2015-11-14 DIAGNOSIS — G4733 Obstructive sleep apnea (adult) (pediatric): Secondary | ICD-10-CM

## 2015-11-14 DIAGNOSIS — F319 Bipolar disorder, unspecified: Secondary | ICD-10-CM

## 2015-11-14 NOTE — Assessment & Plan Note (Signed)
Poor sleep hygiene with lack of self discipline, daytime inactivity permitting drowsiness and naps such that he is not sleepy at night. If he goes back to work this will impose some schedule which should help. Compliance is adequate but could be better. Control is good at current pressure and he admits CPAP helps him.

## 2015-11-14 NOTE — Patient Instructions (Signed)
Order- DME Advanced- download CPAP for pressure compliance  Dx OSA  We will continue CPAP 17

## 2015-11-14 NOTE — Assessment & Plan Note (Signed)
Medications noted. Managed by psychiatry.

## 2015-11-14 NOTE — Progress Notes (Signed)
07/14/12- 18 yoM nonsmoker referred courtesy of Dr Rayann Heman for evaluation of sleep complaints.  He has a hx of loud snoring and daytime somnolence, with no ENT surgery. A recent ER visit for chest pain ruled out cardiac. While there he was witnessed having apneic episodes. Wife notes loud snoring. He admits history of easy daytime sleepiness if sits quietly. Was treated with Ritalin in the past but not in recent years. Denies hypnagogic hallucinations, sleep paralysis cataplexy by my description.   bedtime between 10 and 11 PM, sleep latency one hour, wakes 3 times, and is rolling over, before up at 8 AM. History of bipolar disorder, ADHD but no cardiopulmonary disease and no ENT surgery. 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  08/20/12- 40 yoM nonsmoker followed for obstructive sleep apnea complicated by history of bipolar disorder, GERD, obesity He recognizes no changes since last visit. NPSG 08/03/12-  AHI 24.1/ hr, moderate OSA. Discussed treatment options   10/01/12- 26 yoM nonsmoker followed for obstructive sleep apnea complicated by history of bipolar disorder, GERD, obesity Review CPAP usage;pt wears CPAP every night for about 7 hours; still moves alot during sleep Had flu vax. Using CPAP 17/ Advanced all night, every night. Changed from auto to fixed pressure 17 with god control documented. Full face mask and humidifier.  6/18/ [age] yoM nonsmoker followed for obstructive sleep apnea complicated by history of bipolar disorder, GERD, obesity FOLLOWS FOR: wears CPAP/ 17/ Advanced every night for at least 6 hours; Can tell if he hasnt slept with it-worn out and snores.   05/12/14- 56 yoM nonsmoker followed for obstructive sleep apnea complicated by history of bipolar disorder, GERD, obesity CPAP/ 17/ Advanced   FOLLOWS FOR: when wearing CPAP gets about 8 hours-in the past 6 months has worn about 8 times-works so much and lays down without it. DME is [facility].  Says he is exhausted when he gets home from work and lies down without his CPAP-discussed. Eyes itch, stuffy nose "allergy"  05/15/15- 28 yoM nonsmoker followed for obstructive sleep apnea, allergic rinitis complicated by history of bipolar disorder, GERD, obesity  wife here FOLLOWS FOR: wears CPAP 17/ Advanced every night-[facility]; DL attached. Having allergy flare up-watery eyes, white spots in nose(dry at times)-spends alot of time outside. Download indicates adequate use and good control at 17. Wife reports he snores through CPAP at times. She is having to refill humidifier during the night. No obvious leak. Full face mask.  09/15/15- 29 yoM nonsmoker followed for obstructive sleep apnea, allergic rinitis complicated by history of bipolar disorder, GERD, obesity  wife here CPAP 17/ Advanced every night-[facility] Has had multiple interventions for wound dehiscence following lumbar discectomy/ NSGY FOLLOWS FOR: Pt using his CPAP machine; having trouble with humidifier still-runs out of H2O too soon and has burning smell. DME is [place] location. Machine not yet old enough to qual for replacent. Complains of dry mouth. He is taking Vistaril for his bipolar disorder and we discussed antihistamine effect. He says the pressure is good at 20. Wife confirms he does not snore through CPAP. Main sleep disturbance recently has been back pain related to lumbosacral spine surgery with delayed healing.  11/14/2015-29 year old male nonsmoker followed for OSA, allergic rhinitis, complicated by history of bipolar disorder, GERD, obesity CPAP 17/ Advanced  FOLLOWS FOR: DME [facility]. Pt states using CPAP machine  every night for at least 5 hours per night. Pt states excessive sleepiness lately that he feels is medication related  CPAP indicates adequate compliance, better recently, with excellent control at 17 He describes his main sleep problem now is difficulty initiating sleep. Bedtime around 9:30 or 10 PM but he may not  fall asleep before 1 AM, watching TV. His goal is to wake between 7 and 8 AM but his wife often needs to wake him. He expects to get a job starting at the first of the year, which would put him back on a regular schedule. Right now he doesn't do much but sit around and get drowsy if not engaged. Sometimes falls asleep and forgets to put CPAP on. Still complains some of dry mouth despite adjustment of humidifier by home care. Biotene not "cost-effective". Works with a Teacher, music using telemedicine. That doctor had suggested repeat sleep study and evaluation for narcolepsy. An MSLT would not be interpretable on his Behavioral Health medicines. The first issue is to see if he is using his CPAP enough and getting enough nighttime sleep.  ROS-see HPI Constitutional:   No-   weight loss, night sweats, fevers, chills, fatigue, lassitude. HEENT:   No-  headaches, difficulty swallowing, tooth/dental problems, sore throat,       No-  sneezing, itching, ear ache, +nasal congestion, post nasal drip,  CV:  No-  cardiac chest pain, orthopnea, PND, swelling in lower extremities, anasarca,  dizziness, palpitations Resp: + shortness of breath with exertion or at rest.              No-   productive cough,  No non-productive cough,  No- coughing up of blood.              No-   change in color of mucus.  No- wheezing.   Skin: No-   rash or lesions. GI:  + heartburn, indigestion, no-abdominal pain, nausea, vomiting,  GU:  MS:  No-   joint pain or swelling. + Back pain Neuro-     nothing unusual Psych:   See HPI-No- change in mood or affect. + depression or anxiety.  No memory loss.  OBJ- Physical Exam General- Alert, Oriented, Affect-appropriate, Distress- none acute, + morbidly obese Skin- rash-none, lesions- none, excoriation- none Lymphadenopathy- none Head- atraumatic            Eyes- Gross vision intact, PERRLA, conjunctivae and secretions clear            Ears- Hearing, canals-normal            Nose-  Clear, no-Septal dev, mucus, polyps, erosion, perforation             Throat- Mallampati III , mucosa-brown stained, not dry , drainage- none, tonsils- 2+ Neck- flexible , trachea midline, no stridor , thyroid nl, carotid no bruit Chest - symmetrical excursion , unlabored           Heart/CV- RRR , no murmur , no gallop  , no rub, nl s1 s2                           - JVD- none , edema- none, stasis changes- none, varices- none           Lung- clear to P&A, wheeze- none, cough- none , dullness-none, rub- none           Chest wall-  Abd-  Br/ Gen/ Rectal- Not done, not indicated Extrem- cyanosis- none, clubbing, none, atrophy- none, strength- nl Neuro- grossly intact to observation

## 2015-11-14 NOTE — Assessment & Plan Note (Signed)
No effort to change lifestyle towards weight loss. Very electric referral suggestion.

## 2015-11-24 ENCOUNTER — Encounter: Payer: Self-pay | Admitting: Internal Medicine

## 2016-01-02 ENCOUNTER — Emergency Department (HOSPITAL_COMMUNITY)
Admission: EM | Admit: 2016-01-02 | Discharge: 2016-01-03 | Disposition: A | Discharge status: Home / Self Care | Payer: Medicaid Other | Attending: Emergency Medicine | Admitting: Emergency Medicine

## 2016-01-02 ENCOUNTER — Encounter (HOSPITAL_COMMUNITY): Payer: Self-pay | Admitting: Emergency Medicine

## 2016-01-02 DIAGNOSIS — F209 Schizophrenia, unspecified: Secondary | ICD-10-CM | POA: Insufficient documentation

## 2016-01-02 DIAGNOSIS — R197 Diarrhea, unspecified: Secondary | ICD-10-CM | POA: Insufficient documentation

## 2016-01-02 DIAGNOSIS — Z8614 Personal history of Methicillin resistant Staphylococcus aureus infection: Secondary | ICD-10-CM | POA: Insufficient documentation

## 2016-01-02 DIAGNOSIS — Z9981 Dependence on supplemental oxygen: Secondary | ICD-10-CM | POA: Diagnosis not present

## 2016-01-02 DIAGNOSIS — G8929 Other chronic pain: Secondary | ICD-10-CM | POA: Diagnosis not present

## 2016-01-02 DIAGNOSIS — Z79899 Other long term (current) drug therapy: Secondary | ICD-10-CM | POA: Diagnosis not present

## 2016-01-02 DIAGNOSIS — F319 Bipolar disorder, unspecified: Secondary | ICD-10-CM | POA: Insufficient documentation

## 2016-01-02 DIAGNOSIS — R0981 Nasal congestion: Secondary | ICD-10-CM | POA: Diagnosis present

## 2016-01-02 DIAGNOSIS — F419 Anxiety disorder, unspecified: Secondary | ICD-10-CM | POA: Insufficient documentation

## 2016-01-02 DIAGNOSIS — K219 Gastro-esophageal reflux disease without esophagitis: Secondary | ICD-10-CM | POA: Insufficient documentation

## 2016-01-02 DIAGNOSIS — R112 Nausea with vomiting, unspecified: Secondary | ICD-10-CM | POA: Diagnosis not present

## 2016-01-02 DIAGNOSIS — R1013 Epigastric pain: Secondary | ICD-10-CM | POA: Diagnosis not present

## 2016-01-02 DIAGNOSIS — G4733 Obstructive sleep apnea (adult) (pediatric): Secondary | ICD-10-CM | POA: Diagnosis not present

## 2016-01-02 DIAGNOSIS — E669 Obesity, unspecified: Secondary | ICD-10-CM | POA: Diagnosis not present

## 2016-01-02 DIAGNOSIS — J069 Acute upper respiratory infection, unspecified: Secondary | ICD-10-CM | POA: Diagnosis not present

## 2016-01-02 DIAGNOSIS — Z8701 Personal history of pneumonia (recurrent): Secondary | ICD-10-CM | POA: Insufficient documentation

## 2016-01-02 LAB — COMPREHENSIVE METABOLIC PANEL
ALK PHOS: 68 U/L (ref 38–126)
ALT: 26 U/L (ref 17–63)
AST: 24 U/L (ref 15–41)
Albumin: 4.2 g/dL (ref 3.5–5.0)
Anion gap: 8 (ref 5–15)
BILIRUBIN TOTAL: 0.3 mg/dL (ref 0.3–1.2)
BUN: 12 mg/dL (ref 6–20)
CO2: 29 mmol/L (ref 22–32)
Calcium: 9.2 mg/dL (ref 8.9–10.3)
Chloride: 103 mmol/L (ref 101–111)
Creatinine, Ser: 0.99 mg/dL (ref 0.61–1.24)
Glucose, Bld: 75 mg/dL (ref 65–99)
Potassium: 3.6 mmol/L (ref 3.5–5.1)
Sodium: 140 mmol/L (ref 135–145)
Total Protein: 7.1 g/dL (ref 6.5–8.1)

## 2016-01-02 LAB — CBC
HEMATOCRIT: 43 % (ref 39.0–52.0)
HEMOGLOBIN: 14.2 g/dL (ref 13.0–17.0)
MCH: 27.2 pg (ref 26.0–34.0)
MCHC: 33 g/dL (ref 30.0–36.0)
MCV: 82.2 fL (ref 78.0–100.0)
Platelets: 249 10*3/uL (ref 150–400)
RBC: 5.23 MIL/uL (ref 4.22–5.81)
RDW: 14.4 % (ref 11.5–15.5)
WBC: 9.5 10*3/uL (ref 4.0–10.5)

## 2016-01-02 LAB — URINALYSIS, ROUTINE W REFLEX MICROSCOPIC
BILIRUBIN URINE: NEGATIVE
Glucose, UA: NEGATIVE mg/dL
HGB URINE DIPSTICK: NEGATIVE
Ketones, ur: NEGATIVE mg/dL
Leukocytes, UA: NEGATIVE
Nitrite: NEGATIVE
PH: 5.5 (ref 5.0–8.0)
Protein, ur: NEGATIVE mg/dL
SPECIFIC GRAVITY, URINE: 1.025 (ref 1.005–1.030)

## 2016-01-02 LAB — LIPASE, BLOOD: LIPASE: 35 U/L (ref 11–51)

## 2016-01-02 MED ORDER — DEXAMETHASONE SODIUM PHOSPHATE 4 MG/ML IJ SOLN
10.0000 mg | Freq: Once | INTRAMUSCULAR | Status: AC
  Administered 2016-01-02: 10 mg via INTRAVENOUS
  Filled 2016-01-02: qty 3

## 2016-01-02 MED ORDER — ONDANSETRON HCL 4 MG/2ML IJ SOLN
4.0000 mg | Freq: Once | INTRAMUSCULAR | Status: AC | PRN
  Administered 2016-01-02: 4 mg via INTRAVENOUS
  Filled 2016-01-02: qty 2

## 2016-01-02 MED ORDER — FAMOTIDINE IN NACL 20-0.9 MG/50ML-% IV SOLN
20.0000 mg | Freq: Once | INTRAVENOUS | Status: AC
  Administered 2016-01-02: 20 mg via INTRAVENOUS
  Filled 2016-01-02: qty 50

## 2016-01-02 MED ORDER — METOCLOPRAMIDE HCL 5 MG/ML IJ SOLN
10.0000 mg | Freq: Once | INTRAMUSCULAR | Status: AC
  Administered 2016-01-02: 10 mg via INTRAVENOUS
  Filled 2016-01-02: qty 2

## 2016-01-02 MED ORDER — DIPHENHYDRAMINE HCL 50 MG/ML IJ SOLN
25.0000 mg | Freq: Once | INTRAMUSCULAR | Status: AC
  Administered 2016-01-02: 25 mg via INTRAVENOUS
  Filled 2016-01-02: qty 1

## 2016-01-02 NOTE — ED Provider Notes (Signed)
CSN: [id number]     Arrival date & time 01/02/16  2104 History   First MD Initiated Contact with Patient 01/02/16 2242     Chief Complaint  Patient presents with  . Nasal Congestion  . Emesis     (Consider location/radiation/quality/duration/timing/severity/associated sxs/prior Treatment) Patient is a 30 y.o. male presenting with vomiting. The history is provided by the patient.  Emesis Severity:  Mild Duration:  1 day Timing:  Intermittent Number of daily episodes:  4 Quality:  Stomach contents Progression:  Worsening Chronicity:  New Recent urination:  Decreased Relieved by:  Nothing Worsened by:  Nothing tried Ineffective treatments:  Antiemetics, ice chips and liquids Associated symptoms: abdominal pain, chills, diarrhea, fever, headaches, myalgias, sore throat and URI   Associated symptoms: no cough   Headaches:    Severity:  Moderate   Onset quality:  Gradual   Duration:  1 day   Timing:  Constant   Progression:  Worsening   Chronicity:  New  [name] is a 30 y.o. male who presents the ED with congestion, headache x 1 week and today n/v/d. He has tried zofran and liquids without relief.   Past Medical History  Diagnosis Date  . Tobacco dipper   . GERD (gastroesophageal reflux disease)   . ADHD (attention deficit hyperactivity disorder)   . Hx MRSA infection 8/08    right thigh  . S/P colonoscopy 11/10, 10/08    Dr [name]  . Occult GI bleeding 12/2008    Trivial upper GI bleed/uncontrolled GERD by upper endoscopy 12/2008, normal f/u endoscopy 03/2009 with SBCE at that time  . Esophagitis     Distal esophageal erosions consistent with mild erosive  reflux esophagitis 12/2008 by EGD   . Thyroid function test abnormal     Noted in 2011 discharge  . Obesity   . Gastric ulcer   . Hypercholesteremia   . Hiatal hernia   . OSA on CPAP   . History of stomach ulcers   . Chronic lower back pain   . Anxiety   . Depression   . Bipolar disorder ([place])    . Schizophrenia ([place])   . Pneumonia 09/01/2015    "on ATB still" (09/04/2015)   Past Surgical History  Procedure Laterality Date  . Vasectomy    . Ankle fracture surgery Left ~ 2008  . Small bowel capsule  04/11/2009     normal throughout  . Esophagogastroduodenoscopy   04/07/2009    Normal esophagus, small hiatal hernia  . Esophagogastroduodenoscopy  01/09/2009    Distal esophageal erosions consistent with mild erosive reflux esophagitis, otherwise normal esophagus, small hiatal herniaotherwise normal stomach, D1-D2   . Ileocolonoscopy  08/26/2007     A normal rectum, colon, and terminal ileum  . Esophagogastroduodenoscopy  08/26/2007    Normal esophagus, a small hiatal/hernia, otherwise normal stomach D1 through D3  . Colonoscopy  10/10/2009    anal papilla otherwise normal  . Back surgery    . Lumbar microdiscectomy  08/21/2015  . Incision and drainage  09/04/2015    "reopened my back incsion"  . Fracture surgery    . Lumbar wound debridement N/A 09/04/2015    Procedure: LUMBAR WOUND DEBRIDEMENT;  Surgeon: Consuella Lose, MD;  Location: [facility];  Service: Neurosurgery;  Laterality: N/A;   Family History  Problem Relation Age of Onset  . Leukemia Father 21  . Seizures Mother   . Bipolar disorder Brother   . ADD / ADHD Brother   .  Colon cancer Neg Hx    Social History  Substance Use Topics  . Smoking status: Never Smoker   . Smokeless tobacco: Current User    Types: Snuff  . Alcohol Use: No    Review of Systems  Constitutional: Positive for chills.  HENT: Positive for sore throat.   Gastrointestinal: Positive for vomiting, abdominal pain and diarrhea.  Musculoskeletal: Positive for myalgias.  Neurological: Positive for headaches.  all other systems negative    Allergies  Ibuprofen; Influenza vaccines; Tylenol; Bee venom; and Tramadol  Home Medications   Prior to Admission medications   Medication Sig Start Date End Date Taking? Authorizing Provider   citalopram (CELEXA) 20 MG tablet Take 20 mg by mouth 2 (two) times daily.    Yes Historical Provider, MD  diazepam (VALIUM) 5 MG tablet Take 1 tablet (5 mg total) by mouth every 6 (six) hours as needed for muscle spasms. 09/07/15  Yes Earnie Larsson, MD  diphenhydrAMINE (BENADRYL) 25 MG tablet Take 25 mg by mouth every 6 (six) hours as needed.   Yes Historical Provider, MD  EPINEPHrine 0.3 mg/0.3 mL IJ SOAJ injection Inject 0.3 mg into the muscle once.   Yes Historical Provider, MD  lamoTRIgine (LAMICTAL) 25 MG tablet Take 25 mg by mouth daily.   Yes Historical Provider, MD  ondansetron (ZOFRAN) 8 MG tablet Take 8 mg by mouth every 8 (eight) hours as needed for nausea or vomiting.   Yes Historical Provider, MD  Oxcarbazepine (TRILEPTAL) 300 MG tablet Take 300 mg by mouth 2 (two) times daily.   Yes Historical Provider, MD  pantoprazole (PROTONIX) 40 MG tablet Take 1 tablet (40 mg total) by mouth daily. Patient taking differently: Take 40 mg by mouth at bedtime.  06/15/15 09/28/16 Yes Carlis Stable, NP  pseudoephedrine (SUDAFED) 30 MG tablet Take 30 mg by mouth every 4 (four) hours as needed for congestion.   Yes Historical Provider, MD  Pseudoephedrine-Ibuprofen (COLD & SINUS RELIEF) 30-200 MG TABS Take 1-2 tablets by mouth daily as needed (FOR COLD AND COUGH).   Yes Historical Provider, MD  traZODone (DESYREL) 50 MG tablet Take 50 mg by mouth at bedtime.   Yes Historical Provider, MD   BP 148/77 mmHg  Pulse 83  Temp(Src) 98.9 F (37.2 C) (Oral)  Resp 16  Ht 6' (1.829 m)  Wt 170.099 kg  BMI 50.85 kg/m2  SpO2 100% Physical Exam  Constitutional: He is oriented to person, place, and time. He appears well-developed and well-nourished. No distress.  HENT:  Head: Normocephalic and atraumatic.  Right Ear: Tympanic membrane normal.  Left Ear: Tympanic membrane normal.  Nose: Mucosal edema and rhinorrhea present.  Mouth/Throat: Uvula is midline and mucous membranes are normal. Posterior oropharyngeal  erythema present.  Eyes: Conjunctivae and EOM are normal. Pupils are equal, round, and reactive to light.  Neck: Normal range of motion. Neck supple.  Cardiovascular: Normal rate and regular rhythm.   Pulmonary/Chest: Effort normal and breath sounds normal.  Abdominal: Soft. Bowel sounds are normal. There is tenderness in the epigastric area. There is no CVA tenderness.  Musculoskeletal: Normal range of motion.  Neurological: He is alert and oriented to person, place, and time. No cranial nerve deficit.  Skin: Skin is warm and dry.  Psychiatric: He has a normal mood and affect. His behavior is normal.  Nursing note and vitals reviewed.   ED Course  Procedures (including critical care time) IV hydration, Reglan, Benadryl, Decadron, Pepcid IV   After IV hydration and  medications patient states he feels much better and his headache is completely gone. He is taking PO fluids without difficulty.  MDM  31 y.o. male with URI, headache and n/v/d that has improved significantly with treatment. Stable for d/c with normal labs and does not appear toxic. Will treat for URI and viral gastroenteritis. Discussed with the patient and all questioned fully answered. He will return if any problems arise.   Final diagnoses:  Nausea and vomiting, vomiting of unspecified type  URI (upper respiratory infection)        Ashley Murrain, NP 01/03/16 [place], DO 01/06/16 [phone]

## 2016-01-02 NOTE — ED Notes (Signed)
Pt states that he has been having congestion and headache for 1.5 weeks, today started having nausea/vomiting/diarrhea.

## 2016-01-02 NOTE — ED Notes (Signed)
Pt actively vomiting.

## 2016-01-03 NOTE — ED Notes (Signed)
Pt given drink for a fluid challenge.

## 2016-02-14 ENCOUNTER — Emergency Department (HOSPITAL_COMMUNITY): Payer: Medicaid Other

## 2016-02-14 ENCOUNTER — Emergency Department (HOSPITAL_COMMUNITY)
Admission: EM | Admit: 2016-02-14 | Discharge: 2016-02-14 | Disposition: A | Discharge status: Home / Self Care | Payer: Medicaid Other | Attending: Emergency Medicine | Admitting: Emergency Medicine

## 2016-02-14 ENCOUNTER — Encounter (HOSPITAL_COMMUNITY): Payer: Self-pay | Admitting: Emergency Medicine

## 2016-02-14 DIAGNOSIS — M19072 Primary osteoarthritis, left ankle and foot: Secondary | ICD-10-CM

## 2016-02-14 DIAGNOSIS — W228XXA Striking against or struck by other objects, initial encounter: Secondary | ICD-10-CM | POA: Insufficient documentation

## 2016-02-14 DIAGNOSIS — E669 Obesity, unspecified: Secondary | ICD-10-CM | POA: Diagnosis not present

## 2016-02-14 DIAGNOSIS — S99912A Unspecified injury of left ankle, initial encounter: Secondary | ICD-10-CM | POA: Diagnosis present

## 2016-02-14 DIAGNOSIS — Y939 Activity, unspecified: Secondary | ICD-10-CM | POA: Insufficient documentation

## 2016-02-14 DIAGNOSIS — F17290 Nicotine dependence, other tobacco product, uncomplicated: Secondary | ICD-10-CM | POA: Diagnosis not present

## 2016-02-14 DIAGNOSIS — Y929 Unspecified place or not applicable: Secondary | ICD-10-CM | POA: Insufficient documentation

## 2016-02-14 DIAGNOSIS — Y999 Unspecified external cause status: Secondary | ICD-10-CM | POA: Insufficient documentation

## 2016-02-14 DIAGNOSIS — S93402A Sprain of unspecified ligament of left ankle, initial encounter: Secondary | ICD-10-CM | POA: Diagnosis not present

## 2016-02-14 DIAGNOSIS — F329 Major depressive disorder, single episode, unspecified: Secondary | ICD-10-CM | POA: Insufficient documentation

## 2016-02-14 NOTE — Discharge Instructions (Signed)
Ankle Sprain  An ankle sprain is an injury to the strong, fibrous tissues (ligaments) that hold the bones of your ankle joint together.   CAUSES  An ankle sprain is usually caused by a fall or by twisting your ankle. Ankle sprains most commonly occur when you step on the outer edge of your foot, and your ankle turns inward. People who participate in sports are more prone to these types of injuries.   SYMPTOMS    Pain in your ankle. The pain may be present at rest or only when you are trying to stand or walk.   Swelling.   Bruising. Bruising may develop immediately or within 1 to 2 days after your injury.   Difficulty standing or walking, particularly when turning corners or changing directions.  DIAGNOSIS   Your caregiver will ask you details about your injury and perform a physical exam of your ankle to determine if you have an ankle sprain. During the physical exam, your caregiver will press on and apply pressure to specific areas of your foot and ankle. Your caregiver will try to move your ankle in certain ways. An X-ray exam may be done to be sure a bone was not broken or a ligament did not separate from one of the bones in your ankle (avulsion fracture).   TREATMENT   Certain types of braces can help stabilize your ankle. Your caregiver can make a recommendation for this. Your caregiver may recommend the use of medicine for pain. If your sprain is severe, your caregiver may refer you to a surgeon who helps to restore function to parts of your skeletal system (orthopedist) or a physical therapist.  HOME CARE INSTRUCTIONS    Apply ice to your injury for 1-2 days or as directed by your caregiver. Applying ice helps to reduce inflammation and pain.    Put ice in a plastic bag.    Place a towel between your skin and the bag.    Leave the ice on for 15-20 minutes at a time, every 2 hours while you are awake.   Only take over-the-counter or prescription medicines for pain, discomfort, or fever as directed by  your caregiver.   Elevate your injured ankle above the level of your heart as much as possible for 2-3 days.   If your caregiver recommends crutches, use them as instructed. Gradually put weight on the affected ankle. Continue to use crutches or a cane until you can walk without feeling pain in your ankle.   If you have a plaster splint, wear the splint as directed by your caregiver. Do not rest it on anything harder than a pillow for the first 24 hours. Do not put weight on it. Do not get it wet. You may take it off to take a shower or bath.   You may have been given an elastic bandage to wear around your ankle to provide support. If the elastic bandage is too tight (you have numbness or tingling in your foot or your foot becomes cold and blue), adjust the bandage to make it comfortable.   If you have an air splint, you may blow more air into it or let air out to make it more comfortable. You may take your splint off at night and before taking a shower or bath. Wiggle your toes in the splint several times per day to decrease swelling.  SEEK MEDICAL CARE IF:    You have rapidly increasing bruising or swelling.   Your toes feel   extremely cold or you lose feeling in your foot.   Your pain is not relieved with medicine.  SEEK IMMEDIATE MEDICAL CARE IF:   Your toes are numb or blue.   You have severe pain that is increasing.  MAKE SURE YOU:    Understand these instructions.   Will watch your condition.   Will get help right away if you are not doing well or get worse.     This information is not intended to replace advice given to you by your health care provider. Make sure you discuss any questions you have with your health care provider.     Document Released: 11/11/2005 Document Revised: 12/02/2014 Document Reviewed: 11/23/2011  Elsevier Interactive Patient Education 2016 [employer]

## 2016-02-14 NOTE — ED Notes (Signed)
Injury to left ankle, stepped off porch and foot went into hole.  Pt says he hear something pop.  Rates pain 9/10.  Toes are numb.  Pt says he his broken left ankle 3 times, fractured 6 times and sprang 7-8 times.

## 2016-02-14 NOTE — ED Notes (Signed)
ASO to left ankle. Ice pack given.

## 2016-02-14 NOTE — ED Provider Notes (Signed)
CSN: [id number]     Arrival date & time 02/14/16  1640 History   First MD Initiated Contact with Patient 02/14/16 1752     Chief Complaint  Patient presents with  . Ankle Pain    left     (Consider location/radiation/quality/duration/timing/severity/associated sxs/prior Treatment) Patient is a 30 y.o. male presenting with ankle pain. The history is provided by the patient.  Ankle Pain Location:  Ankle Time since incident:  1 day Injury: yes   Mechanism of injury comment:  Step off porch and injured left ankle Ankle location:  L ankle Pain details:    Quality:  Aching   Severity:  Moderate   Onset quality:  Gradual   Duration:  1 day   Timing:  Intermittent   Progression:  Worsening Dislocation: no   Worsened by:  Bearing weight Associated symptoms: decreased ROM, numbness and stiffness   Risk factors: obesity     Past Medical History  Diagnosis Date  . Tobacco dipper   . GERD (gastroesophageal reflux disease)   . ADHD (attention deficit hyperactivity disorder)   . Hx MRSA infection 8/08    right thigh  . S/P colonoscopy 11/10, 10/08    Dr [name]  . Occult GI bleeding 12/2008    Trivial upper GI bleed/uncontrolled GERD by upper endoscopy 12/2008, normal f/u endoscopy 03/2009 with SBCE at that time  . Esophagitis     Distal esophageal erosions consistent with mild erosive  reflux esophagitis 12/2008 by EGD   . Thyroid function test abnormal     Noted in 2011 discharge  . Obesity   . Gastric ulcer   . Hypercholesteremia   . Hiatal hernia   . OSA on CPAP   . History of stomach ulcers   . Chronic lower back pain   . Anxiety   . Depression   . Bipolar disorder ([place])   . Schizophrenia ([place])   . Pneumonia 09/01/2015    "on ATB still" (09/04/2015)   Past Surgical History  Procedure Laterality Date  . Vasectomy    . Ankle fracture surgery Left ~ 2008  . Small bowel capsule  04/11/2009     normal throughout  . Esophagogastroduodenoscopy   04/07/2009    Normal  esophagus, small hiatal hernia  . Esophagogastroduodenoscopy  01/09/2009    Distal esophageal erosions consistent with mild erosive reflux esophagitis, otherwise normal esophagus, small hiatal herniaotherwise normal stomach, D1-D2   . Ileocolonoscopy  08/26/2007     A normal rectum, colon, and terminal ileum  . Esophagogastroduodenoscopy  08/26/2007    Normal esophagus, a small hiatal/hernia, otherwise normal stomach D1 through D3  . Colonoscopy  10/10/2009    anal papilla otherwise normal  . Back surgery    . Lumbar microdiscectomy  08/21/2015  . Incision and drainage  09/04/2015    "reopened my back incsion"  . Fracture surgery    . Lumbar wound debridement N/A 09/04/2015    Procedure: LUMBAR WOUND DEBRIDEMENT;  Surgeon: Consuella Lose, MD;  Location: [facility];  Service: Neurosurgery;  Laterality: N/A;   Family History  Problem Relation Age of Onset  . Leukemia Father 16  . Seizures Mother   . Bipolar disorder Brother   . ADD / ADHD Brother   . Colon cancer Neg Hx    Social History  Substance Use Topics  . Smoking status: Never Smoker   . Smokeless tobacco: Current User    Types: Snuff  . Alcohol Use: No    Review of  Systems  Musculoskeletal: Positive for arthralgias and stiffness.  All other systems reviewed and are negative.     Allergies  Ibuprofen; Influenza vaccines; Tylenol; Bee venom; and Tramadol  Home Medications   Prior to Admission medications   Medication Sig Start Date End Date Taking? Authorizing Provider  citalopram (CELEXA) 20 MG tablet Take 20 mg by mouth 2 (two) times daily.     Historical Provider, MD  diazepam (VALIUM) 5 MG tablet Take 1 tablet (5 mg total) by mouth every 6 (six) hours as needed for muscle spasms. 09/07/15   Earnie Larsson, MD  diphenhydrAMINE (BENADRYL) 25 MG tablet Take 25 mg by mouth every 6 (six) hours as needed.    Historical Provider, MD  EPINEPHrine 0.3 mg/0.3 mL IJ SOAJ injection Inject 0.3 mg into the muscle once.     Historical Provider, MD  lamoTRIgine (LAMICTAL) 25 MG tablet Take 25 mg by mouth daily.    Historical Provider, MD  ondansetron (ZOFRAN) 8 MG tablet Take 8 mg by mouth every 8 (eight) hours as needed for nausea or vomiting.    Historical Provider, MD  Oxcarbazepine (TRILEPTAL) 300 MG tablet Take 300 mg by mouth 2 (two) times daily.    Historical Provider, MD  pantoprazole (PROTONIX) 40 MG tablet Take 1 tablet (40 mg total) by mouth daily. Patient taking differently: Take 40 mg by mouth at bedtime.  06/15/15 09/28/16  Carlis Stable, NP  pseudoephedrine (SUDAFED) 30 MG tablet Take 30 mg by mouth every 4 (four) hours as needed for congestion.    Historical Provider, MD  Pseudoephedrine-Ibuprofen (COLD & SINUS RELIEF) 30-200 MG TABS Take 1-2 tablets by mouth daily as needed (FOR COLD AND COUGH).    Historical Provider, MD  traZODone (DESYREL) 50 MG tablet Take 50 mg by mouth at bedtime.    Historical Provider, MD   BP 133/77 mmHg  Pulse 78  Temp(Src) 98 F (36.7 C) (Oral)  Resp 18  Ht 6' (1.829 m)  Wt 163.295 kg  BMI 48.81 kg/m2  SpO2 100% Physical Exam  Constitutional: He is oriented to person, place, and time. He appears well-developed and well-nourished.  Non-toxic appearance.  HENT:  Head: Normocephalic.  Right Ear: Tympanic membrane and external ear normal.  Left Ear: Tympanic membrane and external ear normal.  Eyes: EOM and lids are normal. Pupils are equal, round, and reactive to light.  Neck: Normal range of motion. Neck supple. Carotid bruit is not present.  Cardiovascular: Normal rate, regular rhythm, normal heart sounds, intact distal pulses and normal pulses.   Pulmonary/Chest: Breath sounds normal. No respiratory distress.  Abdominal: Soft. Bowel sounds are normal. There is no tenderness. There is no guarding.  Musculoskeletal:       Left ankle: He exhibits decreased range of motion. He exhibits no deformity. Tenderness. Lateral malleolus tenderness found.  Lymphadenopathy:        Head (right side): No submandibular adenopathy present.       Head (left side): No submandibular adenopathy present.    He has no cervical adenopathy.  Neurological: He is alert and oriented to person, place, and time. He has normal strength. No cranial nerve deficit or sensory deficit.  Skin: Skin is warm and dry.  Psychiatric: He has a normal mood and affect. His speech is normal.  Nursing note and vitals reviewed.   ED Course  Procedures (including critical care time) Labs Review Labs Reviewed - No data to display  Imaging Review Dg Ankle Complete Left  02/14/2016  CLINICAL DATA:  Stepped off porch into a hole. Left ankle pain and swelling. Remote ankle fracture. Initial encounter. EXAM: LEFT ANKLE COMPLETE - 3+ VIEW COMPARISON:  06/26/2014 FINDINGS: No acute fracture or dislocation is identified. Mild degenerative spurring/ enthesopathic changes are again seen about the ankle with preserved joint space width. No lytic or blastic osseous lesion is seen. There is likely mild soft tissue swelling anterior to the ankle. IMPRESSION: No acute osseous abnormality identified. Electronically Signed   By: Logan Bores M.D.   On: 02/14/2016 17:27   I have personally reviewed and evaluated these images and lab results as part of my medical decision-making.   EKG Interpretation None      MDM  Xray of the left ankle reveals degenerative spur of the ankle. No fx. Pt fit with ASO splint. He has crutches at home. Pt referred to Orthopedics.   Final diagnoses:  None    **I have reviewed nursing notes, vital signs, and all appropriate lab and imaging results for this patient.Lily Kocher, PA-C 02/14/16 [place], MD 02/14/16 [phone]

## 2016-02-21 ENCOUNTER — Emergency Department (HOSPITAL_BASED_OUTPATIENT_CLINIC_OR_DEPARTMENT_OTHER): Payer: Medicaid Other

## 2016-02-21 ENCOUNTER — Encounter (HOSPITAL_BASED_OUTPATIENT_CLINIC_OR_DEPARTMENT_OTHER): Payer: Self-pay

## 2016-02-21 ENCOUNTER — Emergency Department (HOSPITAL_BASED_OUTPATIENT_CLINIC_OR_DEPARTMENT_OTHER)
Admission: EM | Admit: 2016-02-21 | Discharge: 2016-02-21 | Disposition: A | Discharge status: Home / Self Care | Payer: Medicaid Other | Attending: Emergency Medicine | Admitting: Emergency Medicine

## 2016-02-21 DIAGNOSIS — E669 Obesity, unspecified: Secondary | ICD-10-CM | POA: Diagnosis not present

## 2016-02-21 DIAGNOSIS — Z9981 Dependence on supplemental oxygen: Secondary | ICD-10-CM | POA: Insufficient documentation

## 2016-02-21 DIAGNOSIS — G4730 Sleep apnea, unspecified: Secondary | ICD-10-CM | POA: Diagnosis not present

## 2016-02-21 DIAGNOSIS — N5082 Scrotal pain: Secondary | ICD-10-CM

## 2016-02-21 DIAGNOSIS — F419 Anxiety disorder, unspecified: Secondary | ICD-10-CM | POA: Diagnosis not present

## 2016-02-21 DIAGNOSIS — K219 Gastro-esophageal reflux disease without esophagitis: Secondary | ICD-10-CM | POA: Diagnosis not present

## 2016-02-21 DIAGNOSIS — Z8701 Personal history of pneumonia (recurrent): Secondary | ICD-10-CM | POA: Insufficient documentation

## 2016-02-21 DIAGNOSIS — G8929 Other chronic pain: Secondary | ICD-10-CM | POA: Insufficient documentation

## 2016-02-21 DIAGNOSIS — F209 Schizophrenia, unspecified: Secondary | ICD-10-CM | POA: Insufficient documentation

## 2016-02-21 DIAGNOSIS — F319 Bipolar disorder, unspecified: Secondary | ICD-10-CM | POA: Diagnosis not present

## 2016-02-21 DIAGNOSIS — R34 Anuria and oliguria: Secondary | ICD-10-CM | POA: Insufficient documentation

## 2016-02-21 DIAGNOSIS — Z8614 Personal history of Methicillin resistant Staphylococcus aureus infection: Secondary | ICD-10-CM | POA: Diagnosis not present

## 2016-02-21 DIAGNOSIS — R1030 Lower abdominal pain, unspecified: Secondary | ICD-10-CM | POA: Diagnosis present

## 2016-02-21 DIAGNOSIS — N5089 Other specified disorders of the male genital organs: Secondary | ICD-10-CM | POA: Insufficient documentation

## 2016-02-21 DIAGNOSIS — Z79899 Other long term (current) drug therapy: Secondary | ICD-10-CM | POA: Insufficient documentation

## 2016-02-21 DIAGNOSIS — R52 Pain, unspecified: Secondary | ICD-10-CM

## 2016-02-21 LAB — URINALYSIS, ROUTINE W REFLEX MICROSCOPIC
BILIRUBIN URINE: NEGATIVE
Glucose, UA: NEGATIVE mg/dL
HGB URINE DIPSTICK: NEGATIVE
Ketones, ur: NEGATIVE mg/dL
Leukocytes, UA: NEGATIVE
Nitrite: NEGATIVE
PH: 7 (ref 5.0–8.0)
Protein, ur: NEGATIVE mg/dL
SPECIFIC GRAVITY, URINE: 1.024 (ref 1.005–1.030)

## 2016-02-21 LAB — BASIC METABOLIC PANEL
Anion gap: 9 (ref 5–15)
BUN: 14 mg/dL (ref 6–20)
CHLORIDE: 103 mmol/L (ref 101–111)
CO2: 25 mmol/L (ref 22–32)
CREATININE: 0.9 mg/dL (ref 0.61–1.24)
Calcium: 9.3 mg/dL (ref 8.9–10.3)
GFR calc Af Amer: >60 mL/min (ref >60)
GFR calc non Af Amer: >60 mL/min (ref >60)
Glucose, Bld: 98 mg/dL (ref 65–99)
Potassium: 4.3 mmol/L (ref 3.5–5.1)
SODIUM: 137 mmol/L (ref 135–145)

## 2016-02-21 MED ORDER — HYDROMORPHONE HCL 1 MG/ML IJ SOLN
2.0000 mg | Freq: Once | INTRAMUSCULAR | Status: AC
  Administered 2016-02-21: 2 mg via INTRAMUSCULAR
  Filled 2016-02-21: qty 2

## 2016-02-21 NOTE — ED Notes (Signed)
Pt returned from US

## 2016-02-21 NOTE — ED Provider Notes (Signed)
CSN: [id number]     Arrival date & time 02/21/16  1833 History  By signing my name below, I, Terrance Branch, attest that this documentation has been prepared under the direction and in the presence of Orlie Dakin, MD. Electronically Signed: Randa Evens, ED Scribe. 02/21/2016. 7:11 PM.   Chief Complaint  Patient presents with  . Groin Pain    Patient is a 30 y.o. male presenting with groin pain. The history is provided by the patient. No language interpreter was used.  Groin Pain Associated symptoms include abdominal pain.   HPI Comments: [name] is a 30 y.o. male who presents to the Emergency Department complaining of  right groin pain onset 3 weeks prior. Pt states that the pain has recently worsened today and has become constant. He reports associated swelling to the right testicle. Pt states that the Pain is severe, worse with walking or sitting.. Improved with remaining still He reports that he has had abdominal pain for 3 days. Pt states that the pain is worse when ambulating, sitting down and laying down. Denies any medications PTA. Pt also reports that he his urin output has been decraesed the last 3 days.[name] Kitchen He also has pain at lower abdomen radiating to back bilaterally. Pt denies dysuria, nausea or vomiting. Denies tobacco use. Reports occasional alcohol use. Denies illicit drug use. Reports HX of back surgery, ankle surgery and vasectomy.     Past Medical History  Diagnosis Date  . Tobacco dipper   . GERD (gastroesophageal reflux disease)   . ADHD (attention deficit hyperactivity disorder)   . Hx MRSA infection 8/08    right thigh  . S/P colonoscopy 11/10, 10/08    Dr [name]  . Occult GI bleeding 12/2008    Trivial upper GI bleed/uncontrolled GERD by upper endoscopy 12/2008, normal f/u endoscopy 03/2009 with SBCE at that time  . Esophagitis     Distal esophageal erosions consistent with mild erosive  reflux esophagitis 12/2008 by EGD   . Thyroid function  test abnormal     Noted in 2011 discharge  . Obesity   . Gastric ulcer   . Hypercholesteremia   . Hiatal hernia   . OSA on CPAP   . History of stomach ulcers   . Chronic lower back pain   . Anxiety   . Depression   . Bipolar disorder ([place])   . Schizophrenia ([place])   . Pneumonia 09/01/2015    "on ATB still" (09/04/2015)   Past Surgical History  Procedure Laterality Date  . Vasectomy    . Ankle fracture surgery Left ~ 2008  . Small bowel capsule  04/11/2009     normal throughout  . Esophagogastroduodenoscopy   04/07/2009    Normal esophagus, small hiatal hernia  . Esophagogastroduodenoscopy  01/09/2009    Distal esophageal erosions consistent with mild erosive reflux esophagitis, otherwise normal esophagus, small hiatal herniaotherwise normal stomach, D1-D2   . Ileocolonoscopy  08/26/2007     A normal rectum, colon, and terminal ileum  . Esophagogastroduodenoscopy  08/26/2007    Normal esophagus, a small hiatal/hernia, otherwise normal stomach D1 through D3  . Colonoscopy  10/10/2009    anal papilla otherwise normal  . Back surgery    . Lumbar microdiscectomy  08/21/2015  . Incision and drainage  09/04/2015    "reopened my back incsion"  . Fracture surgery    . Lumbar wound debridement N/A 09/04/2015    Procedure: LUMBAR WOUND DEBRIDEMENT;  Surgeon: Consuella Lose, MD;  Location:  [facility];  Service: Neurosurgery;  Laterality: N/A;   Family History  Problem Relation Age of Onset  . Leukemia Father 4  . Seizures Mother   . Bipolar disorder Brother   . ADD / ADHD Brother   . Colon cancer Neg Hx    Social History  Substance Use Topics  . Smoking status: Never Smoker   . Smokeless tobacco: Current User    Types: Snuff  . Alcohol Use: No    Review of Systems  Constitutional: Negative.   HENT: Negative.   Respiratory: Negative.   Cardiovascular: Negative.   Gastrointestinal: Positive for abdominal pain.  Genitourinary: Positive for decreased urine volume,  scrotal swelling and testicular pain.  Musculoskeletal: Negative.   Skin: Negative.   Neurological: Negative.   Psychiatric/Behavioral: Negative.   All other systems reviewed and are negative.     Allergies  Ibuprofen; Influenza vaccines; Tylenol; Bee venom; and Tramadol  Home Medications   Prior to Admission medications   Medication Sig Start Date End Date Taking? Authorizing Provider  citalopram (CELEXA) 20 MG tablet Take 20 mg by mouth 2 (two) times daily.    Yes Historical Provider, MD  diphenhydrAMINE (BENADRYL) 25 MG tablet Take 25 mg by mouth every 6 (six) hours as needed.   Yes Historical Provider, MD  EPINEPHrine 0.3 mg/0.3 mL IJ SOAJ injection Inject 0.3 mg into the muscle once.   Yes Historical Provider, MD  lamoTRIgine (LAMICTAL) 25 MG tablet Take 25 mg by mouth daily.   Yes Historical Provider, MD  Oxcarbazepine (TRILEPTAL) 300 MG tablet Take 300 mg by mouth 2 (two) times daily.   Yes Historical Provider, MD  traZODone (DESYREL) 50 MG tablet Take 50 mg by mouth at bedtime.   Yes Historical Provider, MD  diazepam (VALIUM) 5 MG tablet Take 1 tablet (5 mg total) by mouth every 6 (six) hours as needed for muscle spasms. 09/07/15   Earnie Larsson, MD  ondansetron (ZOFRAN) 8 MG tablet Take 8 mg by mouth every 8 (eight) hours as needed for nausea or vomiting.    Historical Provider, MD  pantoprazole (PROTONIX) 40 MG tablet Take 1 tablet (40 mg total) by mouth daily. Patient taking differently: Take 40 mg by mouth at bedtime.  06/15/15 09/28/16  Carlis Stable, NP  pseudoephedrine (SUDAFED) 30 MG tablet Take 30 mg by mouth every 4 (four) hours as needed for congestion.    Historical Provider, MD  Pseudoephedrine-Ibuprofen (COLD & SINUS RELIEF) 30-200 MG TABS Take 1-2 tablets by mouth daily as needed (FOR COLD AND COUGH).    Historical Provider, MD   BP 125/64 mmHg  Pulse 84  Temp(Src) 97.7 F (36.5 C) (Oral)  Resp 18  Ht 6' (1.829 m)  Wt 360 lb (163.295 kg)  BMI 48.81 kg/m2  SpO2  100%   Physical Exam  Constitutional: He is oriented to person, place, and time. He appears well-developed and well-nourished.  HENT:  Head: Normocephalic and atraumatic.  Eyes: Conjunctivae are normal. Pupils are equal, round, and reactive to light.  Neck: Neck supple. No tracheal deviation present. No thyromegaly present.  Cardiovascular: Normal rate and regular rhythm.   No murmur heard. Pulmonary/Chest: Effort normal and breath sounds normal.  Abdominal: Soft. Bowel sounds are normal. He exhibits no distension. There is no tenderness.  Morbidly obese  Genitourinary: Penis normal.  Right scrotum is tender posteriorly. Testes appear normal I  Musculoskeletal: Normal range of motion. He exhibits no edema or tenderness.  No point tenderness no flank  tenderness  Neurological: He is alert and oriented to person, place, and time. Coordination normal.  Skin: Skin is warm and dry. No rash noted.  Psychiatric: He has a normal mood and affect.  Nursing note and vitals reviewed.   ED Course  Procedures (including critical care time) DIAGNOSTIC STUDIES: Oxygen Saturation is 100% on RA, normal by my interpretation.    COORDINATION OF CARE: 7:10 PM-Discussed treatment plan with pt at bedside and pt agreed to plan.    Labs Review Labs Reviewed - No data to display  Imaging Review No results found.    EKG Interpretation None     8 PM and improved after intramuscular hydromorphone administered. 8 30 p.m. Results for orders placed or performed during the hospital encounter of 02/21/16  Urinalysis, Routine w reflex microscopic (not at [facility])  Result Value Ref Range   Color, Urine YELLOW YELLOW   APPearance CLEAR CLEAR   Specific Gravity, Urine 1.024 1.005 - 1.030   pH 7.0 5.0 - 8.0   Glucose, UA NEGATIVE NEGATIVE mg/dL   Hgb urine dipstick NEGATIVE NEGATIVE   Bilirubin Urine NEGATIVE NEGATIVE   Ketones, ur NEGATIVE NEGATIVE mg/dL   Protein, ur NEGATIVE NEGATIVE mg/dL   Nitrite  NEGATIVE NEGATIVE   Leukocytes, UA NEGATIVE NEGATIVE  Basic metabolic panel  Result Value Ref Range   Sodium 137 135 - 145 mmol/L   Potassium 4.3 3.5 - 5.1 mmol/L   Chloride 103 101 - 111 mmol/L   CO2 25 22 - 32 mmol/L   Glucose, Bld 98 65 - 99 mg/dL   BUN 14 6 - 20 mg/dL   Creatinine, Ser 0.90 0.61 - 1.24 mg/dL   Calcium 9.3 8.9 - 10.3 mg/dL   GFR calc non Af Amer >60 >60 mL/min   GFR calc Af Amer >60 >60 mL/min   Anion gap 9 5 - 15   Dg Ankle Complete Left  02/14/2016  CLINICAL DATA:  Stepped off porch into a hole. Left ankle pain and swelling. Remote ankle fracture. Initial encounter. EXAM: LEFT ANKLE COMPLETE - 3+ VIEW COMPARISON:  06/26/2014 FINDINGS: No acute fracture or dislocation is identified. Mild degenerative spurring/ enthesopathic changes are again seen about the ankle with preserved joint space width. No lytic or blastic osseous lesion is seen. There is likely mild soft tissue swelling anterior to the ankle. IMPRESSION: No acute osseous abnormality identified. Electronically Signed   By: Logan Bores M.D.   On: 02/14/2016 17:27   US Scrotum  02/21/2016  CLINICAL DATA:  Three-week history of right-sided scrotal pain, progressing EXAM: SCROTAL ULTRASOUND DOPPLER ULTRASOUND OF THE TESTICLES TECHNIQUE: Complete ultrasound examination of the testicles, epididymis, and other scrotal structures was performed. Color and spectral Doppler ultrasound were also utilized to evaluate blood flow to the testicles. COMPARISON:  None. FINDINGS: Right testicle Measurements: 5.4 x 2.8 x 2.8 cm. No mass or microlithiasis visualized. Left testicle Measurements: 4.8 x 2.9 x 2.9 cm. No microlithiasis visualized. There is a 3 x 3 x 3 mm echogenic focus in the inferior aspect of the left testis. No other evidence suggesting potential mass. Right epididymis: There is a 4 x 3 mm cyst in the head of the epididymis on the right. There is no enlargement of the epididymis. Left epididymis:  Normal in size and  appearance. Hydrocele: There are small hydroceles bilaterally, slightly larger on the left than on the right. Varicocele:  None visualized. Pulsed Doppler interrogation of both testes demonstrates normal low resistance arterial and venous waveforms bilaterally.  The peak systolic velocity in the right testis is 5 cm/sec. The peak systolic velocity in the left testis is 5 cm/sec. There is a small appendix testis seen on the left. No abscess or scrotal wall thickening apparent. IMPRESSION: Within the left testis, there is a 3 x 3 x 3 mm echogenic focus. This echogenic focus this likely represents a small scar. Differential considerations for this lesion, however, must include benign epidermoid or early seminoma, a malignant lesion. Given this circumstance, appropriate serum tumor markers should be considered. A followup ultrasound of the scrotum in 3 months is advised to assess for stability of this small lesion. There is no other evidence of intratesticular lesion on either side. No evidence of torsion or orchitis on either side. Small appendix testis noted on the left. Hydroceles bilaterally, slightly larger on the left than on the right. No epididymal inflammation noted. Small cyst in the head of the epididymis on the right is noted. Electronically Signed   By: Lowella Grip III M.D.   On: 02/21/2016 20:30   [place] Art/ven Flow Abd Pelv Doppler  02/21/2016  : Scrotal ultrasound and scrotal Doppler ultrasound reports are combined into a single dictation. Electronically Signed   By: Lowella Grip III M.D.   On: 02/21/2016 20:30   Ct Renal Stone Study  02/21/2016  CLINICAL DATA:  Initial evaluation for acute right groin/testicular pain for 3 weeks. Pain radiating to back. EXAM: CT ABDOMEN AND PELVIS WITHOUT CONTRAST TECHNIQUE: Multidetector CT imaging of the abdomen and pelvis was performed following the standard protocol without IV contrast. COMPARISON:  Prior study from 01/03/2015. FINDINGS: Visualized lung  bases are clear. Liver demonstrates a normal unenhanced appearance. Gallbladder within normal limits. No biliary dilatation. Spleen, adrenal glands, and pancreas demonstrate a normal unenhanced appearance. Kidneys are equal in size without evidence of nephrolithiasis or hydronephrosis. 3.3 cm cyst present at the lower pole the left kidney. No radiopaque calculi seen along the course of either renal collecting system. There is no hydroureter. Stomach within normal limits. No evidence for bowel obstruction. No abnormal wall thickening or inflammatory fat stranding seen about the bowels. Appendix not discretely identified, however, no inflammatory changes seen about the cecum or ileocecal valve in the right lower quadrant to suggest acute appendicitis. Bladder partially distended but grossly unremarkable. Prostate within normal limits. No free intraperitoneal air. No free fluid. No pathologically enlarged intra-abdominal or pelvic lymph nodes. No acute osseous abnormality. No worrisome lytic or blastic osseous lesions. Sclerotic focus within the left iliac wing is stable from prior study, likely benign. IMPRESSION: 1. No CT evidence for nephrolithiasis or obstructive uropathy. 2. No other acute intra-abdominal or pelvic process identified. Electronically Signed   By: Jeannine Boga M.D.   On: 02/21/2016 21:36    MDM  Etiology of pain is unclear. I suggested ice pack to groin. No prescriptions written as patient has multiple allergies He is referred to [facility] or back to [facility] care [facility]. He is advised that he should have repeat scrotal ultrasound in 3 months. Final diagnoses:  None  Doubt testicular torsion as patient gradual onset 3 weeks ago Diagnosis scrotal pain          Orlie Dakin, MD 02/22/16 [id number]

## 2016-02-21 NOTE — ED Notes (Signed)
C/o groin pain x 1 month-swelling to right testicle x today-NAD-steady gait

## 2016-02-21 NOTE — ED Notes (Signed)
Pt verbalizes understanding of d/c instructions and denies any further needs at this time. 

## 2016-02-21 NOTE — ED Notes (Signed)
Pt requesting something else for pain, Dr. Winfred Leeds aware

## 2016-02-21 NOTE — ED Notes (Signed)
Patient transported to Ultrasound 

## 2016-02-21 NOTE — Discharge Instructions (Signed)
We suggest that you get a repeat ultrasound of your scrotum in 3 months to check for cancer. Call [facility] to schedule appointment or you may be able to arrange to get the ultrasound through your primary care doctor at the health department.  Hold an ice pack to your groin 4 times daily 30 minutes at a time which will help with discomfort.

## 2016-02-28 ENCOUNTER — Encounter (HOSPITAL_COMMUNITY): Payer: Self-pay | Admitting: Family Medicine

## 2016-02-28 ENCOUNTER — Emergency Department (HOSPITAL_COMMUNITY)
Admission: EM | Admit: 2016-02-28 | Discharge: 2016-02-28 | Disposition: A | Discharge status: Home / Self Care | Payer: Medicaid Other | Attending: Emergency Medicine | Admitting: Emergency Medicine

## 2016-02-28 DIAGNOSIS — G4733 Obstructive sleep apnea (adult) (pediatric): Secondary | ICD-10-CM | POA: Insufficient documentation

## 2016-02-28 DIAGNOSIS — N5082 Scrotal pain: Secondary | ICD-10-CM | POA: Diagnosis not present

## 2016-02-28 DIAGNOSIS — E669 Obesity, unspecified: Secondary | ICD-10-CM | POA: Insufficient documentation

## 2016-02-28 DIAGNOSIS — G8929 Other chronic pain: Secondary | ICD-10-CM | POA: Insufficient documentation

## 2016-02-28 DIAGNOSIS — R1030 Lower abdominal pain, unspecified: Secondary | ICD-10-CM | POA: Diagnosis not present

## 2016-02-28 DIAGNOSIS — Z9981 Dependence on supplemental oxygen: Secondary | ICD-10-CM | POA: Insufficient documentation

## 2016-02-28 LAB — COMPREHENSIVE METABOLIC PANEL
ALK PHOS: 62 U/L (ref 38–126)
ALT: 39 U/L (ref 17–63)
AST: 31 U/L (ref 15–41)
Albumin: 4.2 g/dL (ref 3.5–5.0)
Anion gap: 9 (ref 5–15)
BUN: 10 mg/dL (ref 6–20)
CALCIUM: 9.7 mg/dL (ref 8.9–10.3)
CO2: 29 mmol/L (ref 22–32)
CREATININE: 1.23 mg/dL (ref 0.61–1.24)
Chloride: 103 mmol/L (ref 101–111)
Glucose, Bld: 98 mg/dL (ref 65–99)
Potassium: 3.9 mmol/L (ref 3.5–5.1)
Sodium: 141 mmol/L (ref 135–145)
Total Bilirubin: 0.8 mg/dL (ref 0.3–1.2)
Total Protein: 7 g/dL (ref 6.5–8.1)

## 2016-02-28 LAB — URINALYSIS, ROUTINE W REFLEX MICROSCOPIC
Bilirubin Urine: NEGATIVE
Glucose, UA: NEGATIVE mg/dL
HGB URINE DIPSTICK: NEGATIVE
KETONES UR: NEGATIVE mg/dL
Leukocytes, UA: NEGATIVE
Nitrite: NEGATIVE
PROTEIN: NEGATIVE mg/dL
Specific Gravity, Urine: 1.023 (ref 1.005–1.030)
pH: 7.5 (ref 5.0–8.0)

## 2016-02-28 LAB — CBC
HCT: 46.1 % (ref 39.0–52.0)
Hemoglobin: 15 g/dL (ref 13.0–17.0)
MCH: 26.5 pg (ref 26.0–34.0)
MCHC: 32.5 g/dL (ref 30.0–36.0)
MCV: 81.6 fL (ref 78.0–100.0)
PLATELETS: 286 10*3/uL (ref 150–400)
RBC: 5.65 MIL/uL (ref 4.22–5.81)
RDW: 15.6 % — AB (ref 11.5–15.5)
WBC: 9.9 10*3/uL (ref 4.0–10.5)

## 2016-02-28 LAB — LIPASE, BLOOD: Lipase: 32 U/L (ref 11–51)

## 2016-02-28 NOTE — ED Notes (Signed)
Called x's 3 without response 

## 2016-02-28 NOTE — ED Notes (Signed)
Pt here for lower abd pain, groin pain and into scrotum. Seen at [facility] yesterday and was suppose to follow up today and they canceled appt. sts pain is worse and radiating into back.

## 2016-03-05 ENCOUNTER — Ambulatory Visit (INDEPENDENT_AMBULATORY_CARE_PROVIDER_SITE_OTHER): Payer: Medicaid Other | Admitting: Urology

## 2016-03-05 DIAGNOSIS — N50819 Testicular pain, unspecified: Secondary | ICD-10-CM

## 2016-03-05 DIAGNOSIS — M545 Low back pain: Secondary | ICD-10-CM

## 2016-03-14 ENCOUNTER — Encounter: Payer: Self-pay | Admitting: Internal Medicine

## 2016-03-14 ENCOUNTER — Ambulatory Visit (INDEPENDENT_AMBULATORY_CARE_PROVIDER_SITE_OTHER): Payer: Medicaid Other | Admitting: Internal Medicine

## 2016-03-14 VITALS — BP 122/76 | HR 83 | Ht 69.0 in | Wt 390.8 lb

## 2016-03-14 DIAGNOSIS — G4733 Obstructive sleep apnea (adult) (pediatric): Secondary | ICD-10-CM

## 2016-03-14 NOTE — Patient Instructions (Addendum)
Order- DME [facility]- can his CPAP be fitted with a larger humidifier reservoir, so it doesn't run dry during the night? Any other suggestions?   Dx OSA

## 2016-03-14 NOTE — Progress Notes (Signed)
07/14/12- 30 yoM nonsmoker referred courtesy of Dr Rayann Heman for evaluation of sleep complaints.  He has a hx of loud snoring and daytime somnolence, with no ENT surgery. A recent ER visit for chest pain ruled out cardiac. While there he was witnessed having apneic episodes. Wife notes loud snoring. He admits history of easy daytime sleepiness if sits quietly. Was treated with Ritalin in the past but not in recent years. Denies hypnagogic hallucinations, sleep paralysis cataplexy by my description.   bedtime between 10 and 11 PM, sleep latency one hour, wakes 3 times, and is rolling over, before up at 8 AM. History of bipolar disorder, ADHD but no cardiopulmonary disease and no ENT surgery. 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  08/20/12- 30 yoM nonsmoker followed for obstructive sleep apnea complicated by history of bipolar disorder, GERD, obesity He recognizes no changes since last visit. NPSG 08/03/12-  AHI 24.1/ hr, moderate OSA. Discussed treatment options   10/01/12- 30 yoM nonsmoker followed for obstructive sleep apnea complicated by history of bipolar disorder, GERD, obesity Review CPAP usage;pt wears CPAP every night for about 7 hours; still moves alot during sleep Had flu vax. Using CPAP 17/ [facility] all night, every night. Changed from auto to fixed pressure 17 with god control documented. Full face mask and humidifier.  6/18/ 14-30 yoM nonsmoker followed for obstructive sleep apnea complicated by history of bipolar disorder, GERD, obesity FOLLOWS FOR: wears CPAP/ 17/ Advanced every night for at least 6 hours; Can tell if he hasnt slept with it-worn out and snores.   05/12/14- 30 yoM nonsmoker followed for obstructive sleep apnea complicated by history of bipolar disorder, GERD, obesity CPAP/ 17/ Advanced   FOLLOWS FOR: when wearing CPAP gets about 8 hours-in the past 6 months has worn about 8 times-works so much and lays down without it. DME is [facility].  Says he is exhausted when he gets home from work and lies down without his CPAP-discussed. Eyes itch, stuffy nose "allergy"  05/15/15- 30 yoM nonsmoker followed for obstructive sleep apnea, allergic rinitis complicated by history of bipolar disorder, GERD, obesity  wife here FOLLOWS FOR: wears CPAP 17/ Advanced every night-[facility]; DL attached. Having allergy flare up-watery eyes, white spots in nose(dry at times)-spends alot of time outside. Download indicates adequate use and good control at 17. Wife reports he snores through CPAP at times. She is having to refill humidifier during the night. No obvious leak. Full face mask.  09/15/15- 30 yoM nonsmoker followed for obstructive sleep apnea, allergic rinitis complicated by history of bipolar disorder, GERD, obesity  wife here CPAP 17/ Advanced every night-[facility] Has had multiple interventions for wound dehiscence following lumbar discectomy/ NSGY FOLLOWS FOR: Pt using his CPAP machine; having trouble with humidifier still-runs out of H2O too soon and has burning smell. DME is [place] location. Machine not yet old enough to qual for replacent. Complains of dry mouth. He is taking Vistaril for his bipolar disorder and we discussed antihistamine effect. He says the pressure is good at 20. Wife confirms he does not snore through CPAP. Main sleep disturbance recently has been back pain related to lumbosacral spine surgery with delayed healing.  11/14/2015-30 year old male nonsmoker followed for OSA, allergic rhinitis, complicated by history of bipolar disorder, GERD, obesity CPAP 17/ Advanced  FOLLOWS FOR: DME [facility]. Pt states using CPAP machine  every night for at least 5 hours per night. Pt states excessive sleepiness lately that he feels is medication related  CPAP indicates adequate compliance, better recently, with excellent control at 17 He describes his main sleep problem now is difficulty initiating sleep. Bedtime around 9:30 or 10 PM but he may not  fall asleep before 1 AM, watching TV. His goal is to wake between 7 and 8 AM but his wife often needs to wake him. He expects to get a job starting at the first of the year, which would put him back on a regular schedule. Right now he doesn't do much but sit around and get drowsy if not engaged. Sometimes falls asleep and forgets to put CPAP on. Still complains some of dry mouth despite adjustment of humidifier by home care. Biotene not "cost-effective". Works with a Teacher, music using telemedicine. That doctor had suggested repeat sleep study and evaluation for narcolepsy. An MSLT would not be interpretable on his Behavioral Health medicines. The first issue is to see if he is using his CPAP enough and getting enough nighttime sleep.  03/14/2016-30 year old male nonsmoker followed for OSA, allergic rhinitis, complicated by history of bipolar disorder, GERD, obesity CPAP 20/Advanced FOLLOWS FOR: Pt has not been using CPAP since he recently moved-CPAP is packed away. He moved about 10 days ago and has not unpacked his CPAP machine yet. Says he doesn't sleep as well without it. Humidifier runs dry and was doing so before we raised pressure/flow. He is going to see if home care can't provide a larger humidifier reservoir for this machine. 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  ROS-see HPI Constitutional:   No-   weight loss, night sweats, fevers, chills, fatigue, lassitude. HEENT:   No-  headaches, difficulty swallowing, tooth/dental problems, sore throat,       No-  sneezing, itching, ear ache, +nasal congestion, post nasal drip,  CV:  No-  cardiac chest pain, orthopnea, PND, swelling in lower extremities, anasarca,  dizziness, palpitations Resp: + shortness of breath with exertion or at rest.              No-   productive cough,  No non-productive cough,  No- coughing up of blood.              No-   change in color of mucus.  No- wheezing.   Skin: No-    rash or lesions. GI:  + heartburn, indigestion, no-abdominal pain, nausea, vomiting,  GU:  MS:  No-   joint pain or swelling. + Back pain Neuro-     nothing unusual Psych:   See HPI-No- change in mood or affect. + depression or anxiety.  No memory loss.  OBJ- Physical Exam General- Alert, Oriented, Affect-appropriate, Distress- none acute, + morbidly obese Skin- rash-none, lesions- none, excoriation- none Lymphadenopathy- none Head- atraumatic            Eyes- Gross vision intact, PERRLA, conjunctivae and secretions clear            Ears- Hearing, canals-normal            Nose- Clear, no-Septal dev, mucus, polyps, erosion, perforation             Throat- Mallampati III , mucosa-brown stained, not dry , drainage- none, tonsils- 2+ Neck- flexible , trachea midline, no stridor , thyroid nl, carotid no bruit Chest - symmetrical excursion , unlabored           Heart/CV- RRR , no murmur , no gallop  , no rub, nl s1 s2                           -  JVD- none , edema- none, stasis changes- none, varices- none           Lung- clear to P&A, wheeze- none, cough- none , dullness-none, rub- none           Chest wall-  Abd-  Br/ Gen/ Rectal- Not done, not indicated Extrem- cyanosis- none, clubbing, none, atrophy- none, strength- nl Neuro- grossly intact to observation

## 2016-03-15 ENCOUNTER — Ambulatory Visit: Payer: 59 | Admitting: Internal Medicine

## 2016-03-15 NOTE — Assessment & Plan Note (Signed)
Importance of quickly getting started back with his CPAP emphasized. We discussed his problem with CPAP humidifier running dry age night. He will review this with DME.

## 2016-03-15 NOTE — Assessment & Plan Note (Signed)
He is not making an effort to lose weight. Bariatric referral could be an option.

## 2016-03-21 ENCOUNTER — Encounter: Payer: Self-pay | Admitting: Internal Medicine

## 2016-05-07 ENCOUNTER — Ambulatory Visit: Payer: Medicaid Other | Admitting: Family Medicine

## 2016-05-09 ENCOUNTER — Ambulatory Visit (INDEPENDENT_AMBULATORY_CARE_PROVIDER_SITE_OTHER): Payer: Medicaid Other | Admitting: Family Medicine

## 2016-05-09 ENCOUNTER — Encounter: Payer: Self-pay | Admitting: Family Medicine

## 2016-05-09 VITALS — BP 106/66 | HR 84 | Temp 97.5°F | Ht 69.0 in | Wt 379.0 lb

## 2016-05-09 DIAGNOSIS — M5440 Lumbago with sciatica, unspecified side: Secondary | ICD-10-CM | POA: Diagnosis not present

## 2016-05-09 NOTE — Progress Notes (Signed)
Subjective:  Patient ID: [name], male    DOB: [birth year]  Age: 30 y.o. MRN: [id number]  CC: Pain   HPI [name] presents for continual low back pain. 6-7/10. Not relieved by exercise and medication.    History [name] has a past medical history of Tobacco dipper; GERD (gastroesophageal reflux disease); ADHD (attention deficit hyperactivity disorder); MRSA infection (8/08); S/P colonoscopy (11/10, 10/08); Occult GI bleeding (12/2008); Esophagitis; Thyroid function test abnormal; Obesity; Gastric ulcer; Hypercholesteremia; Hiatal hernia; OSA on CPAP; History of stomach ulcers; Chronic lower back pain; Anxiety; Depression; Bipolar disorder ([place]); Schizophrenia ([place]); and Pneumonia (09/01/2015).   He has past surgical history that includes Vasectomy; Ankle fracture surgery (Left, ~ 2008); Small bowel capsule (04/11/2009); Esophagogastroduodenoscopy ( 04/07/2009); Esophagogastroduodenoscopy (01/09/2009); ileocolonoscopy (08/26/2007); Esophagogastroduodenoscopy (08/26/2007); Colonoscopy (10/10/2009); Back surgery; Lumbar microdiscectomy (08/21/2015); Incision and drainage (09/04/2015); Fracture surgery; and Lumbar wound debridement (N/A, 09/04/2015).   His family history includes ADD / ADHD in his brother; Bipolar disorder in his brother; Leukemia (age of onset: 82) in his father; Seizures in his mother. There is no history of Colon cancer.He reports that he has never smoked. His smokeless tobacco use includes Snuff. He reports that he does not drink alcohol or use illicit drugs.    ROS Review of Systems  Constitutional: Negative for fever, chills and diaphoresis.  HENT: Negative for rhinorrhea and sore throat.   Respiratory: Negative for cough and shortness of breath.   Cardiovascular: Negative for chest pain.  Gastrointestinal: Negative for abdominal pain.  Musculoskeletal: Positive for myalgias, back pain and arthralgias.  Skin: Negative for rash.  Neurological: Negative  for weakness and headaches.    Objective:  BP 106/66 mmHg  Pulse 84  Temp(Src) 97.5 F (36.4 C) (Oral)  Ht 5\' 9"  (1.753 m)  Wt 379 lb (171.913 kg)  BMI 55.94 kg/m2  SpO2 97%  BP Readings from Last 3 Encounters:  05/09/16 106/66  03/14/16 122/76  02/28/16 119/75    Wt Readings from Last 3 Encounters:  05/09/16 379 lb (171.913 kg)  03/14/16 390 lb 12.8 oz (177.266 kg)  02/21/16 360 lb (163.295 kg)     Physical Exam  Constitutional: He is oriented to person, place, and time. He appears well-developed and well-nourished.  HENT:  Head: Normocephalic and atraumatic.  Right Ear: External ear normal.  Left Ear: External ear normal.  Mouth/Throat: No oropharyngeal exudate or posterior oropharyngeal erythema.  Eyes: Pupils are equal, round, and reactive to light.  Neck: Normal range of motion. Neck supple.  Cardiovascular: Normal rate and regular rhythm.   No murmur heard. Pulmonary/Chest: Breath sounds normal. No respiratory distress.  Neurological: He is alert and oriented to person, place, and time.  Vitals reviewed.    Lab Results  Component Value Date   WBC 9.9 02/28/2016   HGB 15.0 02/28/2016   HCT 46.1 02/28/2016   PLT 286 02/28/2016   GLUCOSE 98 02/28/2016   CHOL 151 08/19/2013   TRIG 143 08/19/2013   HDL 41 08/19/2013   LDLCALC 81 08/19/2013   ALT 39 02/28/2016   AST 31 02/28/2016   NA 141 02/28/2016   K 3.9 02/28/2016   CL 103 02/28/2016   CREATININE 1.23 02/28/2016   BUN 10 02/28/2016   CO2 29 02/28/2016   TSH 1.890 08/19/2013   INR 1.02 03/01/2010   HGBA1C 5.2 08/19/2013    No results found.  Assessment & Plan:   [name] was seen today for pain.  Diagnoses and all orders for this  visit:  Midline low back pain with sciatica, sciatica laterality unspecified -     Ambulatory referral to Pain Clinic     I am having [name] maintain his Oxcarbazepine, pantoprazole, citalopram, lamoTRIgine, traZODone, and EPINEPHrine.  No orders of  the defined types were placed in this encounter.     Follow-up: Return if symptoms worsen or fail to improve.  Claretta Fraise, M.D.

## 2016-05-25 ENCOUNTER — Emergency Department (HOSPITAL_BASED_OUTPATIENT_CLINIC_OR_DEPARTMENT_OTHER): Payer: Medicaid Other

## 2016-05-25 ENCOUNTER — Other Ambulatory Visit: Payer: Self-pay

## 2016-05-25 ENCOUNTER — Emergency Department (HOSPITAL_BASED_OUTPATIENT_CLINIC_OR_DEPARTMENT_OTHER)
Admission: EM | Admit: 2016-05-25 | Discharge: 2016-05-25 | Disposition: A | Discharge status: Home / Self Care | Payer: Medicaid Other | Attending: Emergency Medicine | Admitting: Emergency Medicine

## 2016-05-25 ENCOUNTER — Encounter (HOSPITAL_BASED_OUTPATIENT_CLINIC_OR_DEPARTMENT_OTHER): Payer: Self-pay | Admitting: *Deleted

## 2016-05-25 DIAGNOSIS — R0789 Other chest pain: Secondary | ICD-10-CM

## 2016-05-25 DIAGNOSIS — Z79899 Other long term (current) drug therapy: Secondary | ICD-10-CM | POA: Insufficient documentation

## 2016-05-25 DIAGNOSIS — Z6841 Body Mass Index (BMI) 40.0 and over, adult: Secondary | ICD-10-CM | POA: Insufficient documentation

## 2016-05-25 DIAGNOSIS — R110 Nausea: Secondary | ICD-10-CM | POA: Diagnosis not present

## 2016-05-25 DIAGNOSIS — F209 Schizophrenia, unspecified: Secondary | ICD-10-CM | POA: Insufficient documentation

## 2016-05-25 DIAGNOSIS — F319 Bipolar disorder, unspecified: Secondary | ICD-10-CM | POA: Diagnosis not present

## 2016-05-25 DIAGNOSIS — Z87891 Personal history of nicotine dependence: Secondary | ICD-10-CM | POA: Insufficient documentation

## 2016-05-25 DIAGNOSIS — E669 Obesity, unspecified: Secondary | ICD-10-CM | POA: Insufficient documentation

## 2016-05-25 LAB — CBC WITH DIFFERENTIAL/PLATELET
Basophils Absolute: 0 10*3/uL (ref 0.0–0.1)
Basophils Relative: 1 %
EOS PCT: 2 %
Eosinophils Absolute: 0.2 10*3/uL (ref 0.0–0.7)
HEMATOCRIT: 43.4 % (ref 39.0–52.0)
Hemoglobin: 14.5 g/dL (ref 13.0–17.0)
LYMPHS ABS: 3.4 10*3/uL (ref 0.7–4.0)
LYMPHS PCT: 38 %
MCH: 27.9 pg (ref 26.0–34.0)
MCHC: 33.4 g/dL (ref 30.0–36.0)
MCV: 83.6 fL (ref 78.0–100.0)
MONO ABS: 0.7 10*3/uL (ref 0.1–1.0)
MONOS PCT: 8 %
NEUTROS ABS: 4.5 10*3/uL (ref 1.7–7.7)
Neutrophils Relative %: 51 %
PLATELETS: 253 10*3/uL (ref 150–400)
RBC: 5.19 MIL/uL (ref 4.22–5.81)
RDW: 14.9 % (ref 11.5–15.5)
WBC: 8.8 10*3/uL (ref 4.0–10.5)

## 2016-05-25 LAB — COMPREHENSIVE METABOLIC PANEL
ALT: 27 U/L (ref 17–63)
ANION GAP: 6 (ref 5–15)
AST: 27 U/L (ref 15–41)
Albumin: 4.2 g/dL (ref 3.5–5.0)
Alkaline Phosphatase: 63 U/L (ref 38–126)
BILIRUBIN TOTAL: 0.7 mg/dL (ref 0.3–1.2)
BUN: 13 mg/dL (ref 6–20)
CHLORIDE: 106 mmol/L (ref 101–111)
CO2: 27 mmol/L (ref 22–32)
Calcium: 9.2 mg/dL (ref 8.9–10.3)
Creatinine, Ser: 1.06 mg/dL (ref 0.61–1.24)
GFR calc non Af Amer: >60 mL/min (ref >60)
Glucose, Bld: 89 mg/dL (ref 65–99)
POTASSIUM: 3.6 mmol/L (ref 3.5–5.1)
Sodium: 139 mmol/L (ref 135–145)
TOTAL PROTEIN: 6.7 g/dL (ref 6.5–8.1)

## 2016-05-25 LAB — TROPONIN I

## 2016-05-25 MED ORDER — TRAMADOL HCL 50 MG PO TABS
50.0000 mg | ORAL_TABLET | Freq: Once | ORAL | Status: AC
  Administered 2016-05-25: 50 mg via ORAL
  Filled 2016-05-25: qty 1

## 2016-05-25 NOTE — Discharge Instructions (Signed)
Please follow up with cardiologist for reevaluation and further management. Please return immediately if any new or worsening signs or symptoms present.  Chest Wall Pain Chest wall pain is pain in or around the bones and muscles of your chest. Sometimes, an injury causes this pain. Sometimes, the cause may not be known. This pain may take several weeks or longer to get better. HOME CARE INSTRUCTIONS  Pay attention to any changes in your symptoms. Take these actions to help with your pain:   Rest as told by your health care provider.   Avoid activities that cause pain. These include any activities that use your chest muscles or your abdominal and side muscles to lift heavy items.   If directed, apply ice to the painful area:  Put ice in a plastic bag.  Place a towel between your skin and the bag.  Leave the ice on for 20 minutes, 2-3 times per day.  Take over-the-counter and prescription medicines only as told by your health care provider.  Do not use tobacco products, including cigarettes, chewing tobacco, and e-cigarettes. If you need help quitting, ask your health care provider.  Keep all follow-up visits as told by your health care provider. This is important. SEEK MEDICAL CARE IF:  You have a fever.  Your chest pain becomes worse.  You have new symptoms. SEEK IMMEDIATE MEDICAL CARE IF:  You have nausea or vomiting.  You feel sweaty or light-headed.  You have a cough with phlegm (sputum) or you cough up blood.  You develop shortness of breath.   This information is not intended to replace advice given to you by your health care provider. Make sure you discuss any questions you have with your health care provider.   Document Released: 11/11/2005 Document Revised: 08/02/2015 Document Reviewed: 02/06/2015 Elsevier Interactive Patient Education [employer].

## 2016-05-25 NOTE — ED Notes (Signed)
Pt reports left chest pain radiating into back since Thursday with nausea

## 2016-05-25 NOTE — ED Provider Notes (Signed)
CSN: [id number]     Arrival date & time 05/25/16  1729 History  By signing my name below, I, Higinio Plan, attest that this documentation has been prepared under the direction and in the presence of non-physician practitioner, Lenn Sink, PA-C. Electronically Signed: Higinio Plan, Scribe. 05/25/2016. 6:16 PM.   Chief Complaint  Patient presents with  . Chest Pain   The history is provided by the patient. No language interpreter was used.   HPI Comments: [name] is a 30 y.o. male with PMHx of GERD, gastric ulcers, and HLD, who presents to the Emergency Department complaining of gradually worsening, intermittent, "stabbing," chest pain that began three days ago but worsened today. Pt notes his pain begins on the left side of his chest and radiates towards his back. Pt notes that sitting up and taking deep breaths worsens his chest pain; however, he experiences more relief when he lies down. Pt states associated symptoms of nausea. He states this is the first time that he has experienced these symptoms. 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 Pt denies lifting any heavy objects. Pt states he has experienced chest pain before, dissimilar to his current symptoms, and was referred to a cardiologist. He states he had a stress test done ~4 years ago and was told he was fine. He notes a FMHx of CHF and MI under the age of 33. Pt denies abdominal pain, vomiting, leg swelling, hx of smoking, heart problems, blood clots, and lying down for prolonged periods of time. Pt reports he takes 40mg  of Protonix for GERD. Pt states he cannot take aspirin or tylenol due to hx of gastric ulcers.   Past Medical History  Diagnosis Date  . Tobacco dipper   . GERD (gastroesophageal reflux disease)   . ADHD (attention deficit hyperactivity disorder)   . Hx MRSA infection 8/08    right thigh  . S/P colonoscopy 11/10, 10/08    Dr [name]  . Occult GI bleeding 12/2008    Trivial  upper GI bleed/uncontrolled GERD by upper endoscopy 12/2008, normal f/u endoscopy 03/2009 with SBCE at that time  . Esophagitis     Distal esophageal erosions consistent with mild erosive  reflux esophagitis 12/2008 by EGD   . Thyroid function test abnormal     Noted in 2011 discharge  . Obesity   . Gastric ulcer   . Hypercholesteremia   . Hiatal hernia   . OSA on CPAP   . History of stomach ulcers   . Chronic lower back pain   . Anxiety   . Depression   . Bipolar disorder ([place])   . Schizophrenia ([place])   . Pneumonia 09/01/2015    "on ATB still" (09/04/2015)   Past Surgical History  Procedure Laterality Date  . Vasectomy    . Ankle fracture surgery Left ~ 2008  . Small bowel capsule  04/11/2009     normal throughout  . Esophagogastroduodenoscopy   04/07/2009    Normal esophagus, small hiatal hernia  . Esophagogastroduodenoscopy  01/09/2009    Distal esophageal erosions consistent with mild erosive reflux esophagitis, otherwise normal esophagus, small hiatal herniaotherwise normal stomach, D1-D2   . Ileocolonoscopy  08/26/2007     A normal rectum, colon, and terminal ileum  . Esophagogastroduodenoscopy  08/26/2007    Normal esophagus, a small hiatal/hernia, otherwise normal stomach D1 through D3  . Colonoscopy  10/10/2009    anal papilla otherwise normal  . Back surgery    .  Lumbar microdiscectomy  08/21/2015  . Incision and drainage  09/04/2015    "reopened my back incsion"  . Fracture surgery    . Lumbar wound debridement N/A 09/04/2015    Procedure: LUMBAR WOUND DEBRIDEMENT;  Surgeon: Consuella Lose, MD;  Location: [place] [facility];  Service: Neurosurgery;  Laterality: N/A;   Family History  Problem Relation Age of Onset  . Leukemia Father 24  . Seizures Mother   . Bipolar disorder Brother   . ADD / ADHD Brother   . Colon cancer Neg Hx    Social History  Substance Use Topics  . Smoking status: Never Smoker   . Smokeless tobacco: Current User    Types: Snuff  .  Alcohol Use: No    Review of Systems  Cardiovascular: Positive for chest pain. Negative for leg swelling.  Gastrointestinal: Positive for nausea. Negative for vomiting and abdominal pain.  Musculoskeletal: Positive for back pain.   Allergies  Ibuprofen; Influenza vaccines; Tylenol; Bee venom; and Tramadol  Home Medications   Prior to Admission medications   Medication Sig Start Date End Date Taking? Authorizing Provider  citalopram (CELEXA) 20 MG tablet Take 20 mg by mouth 2 (two) times daily.    Yes Historical Provider, MD  EPINEPHrine 0.3 mg/0.3 mL IJ SOAJ injection Inject 0.3 mg into the muscle once.   Yes Historical Provider, MD  lamoTRIgine (LAMICTAL) 25 MG tablet Take 25 mg by mouth daily.   Yes Historical Provider, MD  Oxcarbazepine (TRILEPTAL) 300 MG tablet Take 300 mg by mouth 2 (two) times daily.   Yes Historical Provider, MD  pantoprazole (PROTONIX) 40 MG tablet Take 1 tablet (40 mg total) by mouth daily. Patient taking differently: Take 40 mg by mouth at bedtime.  06/15/15 09/28/16 Yes Carlis Stable, NP  traZODone (DESYREL) 50 MG tablet Take 50 mg by mouth at bedtime.   Yes Historical Provider, MD   BP 116/76 mmHg  Pulse 63  Temp(Src) 97.9 F (36.6 C) (Oral)  Resp 16  Ht 6' (1.829 m)  Wt 170.689 kg  BMI 51.02 kg/m2  SpO2 100% Physical Exam  Constitutional: He is oriented to person, place, and time. He appears well-developed and well-nourished. No distress.  HENT:  Head: Normocephalic and atraumatic.  Eyes: Conjunctivae are normal. Pupils are equal, round, and reactive to light. Right eye exhibits no discharge. Left eye exhibits no discharge. No scleral icterus.  Neck: Normal range of motion. No JVD present. No tracheal deviation present.  Cardiovascular: Normal rate, regular rhythm, normal heart sounds and intact distal pulses.  Exam reveals no gallop and no friction rub.   No murmur heard. Pulmonary/Chest: Effort normal and breath sounds normal. No stridor. No  respiratory distress. He has no wheezes. He has no rales. He exhibits no tenderness.  TTP of left anterior superior chest wall no signs of infection or trauma no rash   Abdominal: Soft. He exhibits no distension and no mass. There is no tenderness. There is no rebound and no guarding.  Musculoskeletal: He exhibits no edema.  Neurological: He is alert and oriented to person, place, and time. Coordination normal.  Skin: Skin is warm and dry. He is not diaphoretic.  Psychiatric: He has a normal mood and affect. His behavior is normal. Judgment and thought content normal.  Nursing note and vitals reviewed.   ED Course  Procedures  DIAGNOSTIC STUDIES:  Oxygen Saturation is 97% on RA, normal, by my interpretation.    COORDINATION OF CARE:  5:56 PM Discussed treatment  plan, which includes EKG and CXR with pt at bedside and pt agreed to plan.  Labs Review Labs Reviewed  CBC WITH DIFFERENTIAL/PLATELET  COMPREHENSIVE METABOLIC PANEL  TROPONIN I    Imaging Review Dg Chest 2 View  05/25/2016  CLINICAL DATA:  Left chest pain radiating to the back EXAM: CHEST  2 VIEW COMPARISON:  09/01/2015 FINDINGS: The heart size and mediastinal contours are within normal limits. Both lungs are clear. The visualized skeletal structures are unremarkable. IMPRESSION: No active cardiopulmonary disease. Electronically Signed   By: Kathreen Devoid   On: 05/25/2016 18:50   I have personally reviewed and evaluated these images and lab results as part of my medical decision-making.   EKG Interpretation None      MDM  Labs:   Imaging: ED-EKG- NS no ST changes  Consults:   Therapeutics:   Discharge Meds:  Assessment/Plan: 30 year old male presents today with chief complaint of chest pain. Patient has exquisite tenderness to palpation of his chest wall, this is producing his symptoms. Troponin negative, EKG with no concerning findings, vital signs reassuring, low heart square. Patient is perk negative, low  suspicion for PE, infectious etiology, ACS. Patient discharged home with cardiology follow-up, and strict return precautions. Patient verbalized understanding and agreement to today's plan had no further questions or concerns at time of discharge  Final diagnoses:  Chest wall pain   I personally performed the services described in this documentation, which was scribed in my presence. The recorded information has been reviewed and is accurate.    Okey Regal, PA-C 05/26/16 0129  Julianne Rice, MD 05/27/16 [phone]

## 2016-05-25 NOTE — ED Notes (Signed)
Pt made aware to return if symptoms worsen or if any life threatening symptoms occur.   

## 2016-06-19 ENCOUNTER — Ambulatory Visit (INDEPENDENT_AMBULATORY_CARE_PROVIDER_SITE_OTHER): Payer: Medicaid Other | Admitting: Family Medicine

## 2016-06-19 ENCOUNTER — Encounter: Payer: Self-pay | Admitting: Family Medicine

## 2016-06-19 VITALS — BP 119/72 | HR 92 | Temp 97.3°F | Ht 69.0 in | Wt 374.0 lb

## 2016-06-19 DIAGNOSIS — K219 Gastro-esophageal reflux disease without esophagitis: Secondary | ICD-10-CM | POA: Diagnosis not present

## 2016-06-19 DIAGNOSIS — F319 Bipolar disorder, unspecified: Secondary | ICD-10-CM

## 2016-06-19 DIAGNOSIS — R072 Precordial pain: Secondary | ICD-10-CM

## 2016-06-19 DIAGNOSIS — R7989 Other specified abnormal findings of blood chemistry: Secondary | ICD-10-CM

## 2016-06-19 DIAGNOSIS — R946 Abnormal results of thyroid function studies: Secondary | ICD-10-CM

## 2016-06-19 MED ORDER — PANTOPRAZOLE SODIUM 40 MG PO TBEC: 40.0000 mg | DELAYED_RELEASE_TABLET | Freq: Two times a day (BID) | ORAL | 11 refills | Status: DC

## 2016-06-19 NOTE — Progress Notes (Signed)
Subjective:  Patient ID: [name], male    DOB: 09-Sep-1986  Age: 30 y.o. MRN: [id number]  CC: Hospitalization Follow-up (chest pain)   HPI [name] presents for Hospital follow-up based on spending the last couple of days at [facility]. He was brought in for chest pain and out of concern for MI he was to be transferred to [facility] but they didn't have a bed so he spent 2 days at [facility] during that time he was ruled out through enzymes for MI. He was then discharged for outpatient follow-up yesterday afternoon. He is now here today for follow-up stating that he is having precordial chest pain intermittently. He describes also has heartburn. He describes this as a burning in his chest. Points to the substernal region. While at [facility] he was ruled out for PE with CT PA  He was seen in the emergency department at [place] on July 1. He was  noted to have a negative EKG for ischemic change. His pain was characterized as stabbing at that time and he had a negative troponin evaluation. His TSH was also noted to be elevated.  History Aadin has a past medical history of ADHD (attention deficit hyperactivity disorder); Anxiety; Bipolar disorder ([place]); Chronic lower back pain; Depression; Esophagitis; Gastric ulcer; GERD (gastroesophageal reflux disease); Hiatal hernia; History of stomach ulcers; MRSA infection (8/08); Hypercholesteremia; Obesity; Occult GI bleeding (12/2008); OSA on CPAP; Pneumonia (09/01/2015); S/P colonoscopy (11/10, 10/08); Schizophrenia ([place]); Thyroid function test abnormal; and Tobacco dipper.   He has a past surgical history that includes Vasectomy; Ankle fracture surgery (Left, ~ 2008); Small bowel capsule (04/11/2009); Esophagogastroduodenoscopy ( 04/07/2009); Esophagogastroduodenoscopy (01/09/2009); ileocolonoscopy (08/26/2007); Esophagogastroduodenoscopy (08/26/2007); Colonoscopy (10/10/2009); Back surgery;  Lumbar microdiscectomy (08/21/2015); Incision and drainage (09/04/2015); Fracture surgery; and Lumbar wound debridement (N/A, 09/04/2015).   His family history includes ADD / ADHD in his brother; Bipolar disorder in his brother; Leukemia (age of onset: 22) in his father; Seizures in his mother.He reports that he has never smoked. His smokeless tobacco use includes Snuff. He reports that he does not drink alcohol or use drugs.    ROS Review of Systems  Constitutional: Negative for chills, diaphoresis, fever and unexpected weight change.  HENT: Negative for congestion, hearing loss, rhinorrhea and sore throat.   Eyes: Negative for visual disturbance.  Respiratory: Negative for cough and chest tightness.   Cardiovascular: Positive for chest pain.  Gastrointestinal: Positive for nausea. Negative for abdominal pain, constipation, diarrhea and vomiting.  Genitourinary: Negative for dysuria and flank pain.  Musculoskeletal: Negative for arthralgias and joint swelling.  Skin: Negative for rash.  Neurological: Negative for dizziness and headaches.  Psychiatric/Behavioral: Negative for dysphoric mood and sleep disturbance.    Objective:  BP 119/72 (BP Location: Left Arm, Patient Position: Sitting, Cuff Size: Large)   Pulse 92   Temp 97.3 F (36.3 C) (Oral)   Ht 5\' 9"  (1.753 m)   Wt (!) 374 lb (169.6 kg)   SpO2 99%   BMI 55.23 kg/m   BP Readings from Last 3 Encounters:  06/19/16 119/72  05/25/16 116/76  05/09/16 106/66    Wt Readings from Last 3 Encounters:  06/19/16 (!) 374 lb (169.6 kg)  05/25/16 (!) 376 lb 4.8 oz (170.7 kg)  05/09/16 (!) 379 lb (171.9 kg)     Physical Exam  Constitutional: He is oriented to person, place, and time. He appears well-developed and well-nourished. No distress.  Obese  HENT:  Head:  Normocephalic and atraumatic.  Right Ear: External ear normal.  Left Ear: External ear normal.  Nose: Nose normal.  Mouth/Throat: Oropharynx is clear and moist.    Eyes: Conjunctivae and EOM are normal. Pupils are equal, round, and reactive to light.  Neck: Normal range of motion. Neck supple. No thyromegaly present.  Cardiovascular: Normal rate, regular rhythm and normal heart sounds.   No murmur heard. Pulmonary/Chest: Effort normal and breath sounds normal. No respiratory distress. He has no wheezes. He has no rales.  Abdominal: Soft. Bowel sounds are normal. He exhibits no distension. There is no tenderness.  Lymphadenopathy:    He has no cervical adenopathy.  Neurological: He is alert and oriented to person, place, and time. He has normal reflexes.  Skin: Skin is warm and dry.  Psychiatric: He has a normal mood and affect. His behavior is normal. Judgment and thought content normal.     Lab Results  Component Value Date   WBC 8.8 05/25/2016   HGB 14.5 05/25/2016   HCT 43.4 05/25/2016   PLT 253 05/25/2016   GLUCOSE 89 05/25/2016   CHOL 151 08/19/2013   TRIG 143 08/19/2013   HDL 41 08/19/2013   LDLCALC 81 08/19/2013   ALT 27 05/25/2016   AST 27 05/25/2016   NA 139 05/25/2016   K 3.6 05/25/2016   CL 106 05/25/2016   CREATININE 1.06 05/25/2016   BUN 13 05/25/2016   CO2 27 05/25/2016   TSH 1.890 08/19/2013   INR 1.02 03/01/2010   HGBA1C 5.2 08/19/2013    Dg Chest 2 View  Result Date: 05/25/2016 CLINICAL DATA:  Left chest pain radiating to the back EXAM: CHEST  2 VIEW COMPARISON:  09/01/2015 FINDINGS: The heart size and mediastinal contours are within normal limits. Both lungs are clear. The visualized skeletal structures are unremarkable. IMPRESSION: No active cardiopulmonary disease. Electronically Signed   By: Kathreen Devoid   On: 05/25/2016 18:50    Assessment & Plan:   Lateef was seen today for hospitalization follow-up.  Diagnoses and all orders for this visit:  BIPOLAR DISORDER UNSPECIFIED  Precordial pain -     Ambulatory referral to Cardiology  Gastroesophageal reflux disease, esophagitis presence not specified -      pantoprazole (PROTONIX) 40 MG tablet; Take 1 tablet (40 mg total) by mouth 2 (two) times daily.  Elevated TSH -     Thyroid Panel With TSH -     T4, Free      I have changed Mr. Jachim's pantoprazole. I am also having him maintain his Oxcarbazepine, citalopram, lamoTRIgine, traZODone, and EPINEPHrine.  Meds ordered this encounter  Medications  . pantoprazole (PROTONIX) 40 MG tablet    Sig: Take 1 tablet (40 mg total) by mouth 2 (two) times daily.    Dispense:  60 tablet    Refill:  11     Follow-up: Return in about 1 month (around 07/20/2016).  Claretta Fraise, M.D.

## 2016-06-20 ENCOUNTER — Emergency Department (HOSPITAL_COMMUNITY): Payer: Medicaid Other

## 2016-06-20 ENCOUNTER — Encounter (HOSPITAL_COMMUNITY): Payer: Self-pay

## 2016-06-20 ENCOUNTER — Emergency Department (HOSPITAL_COMMUNITY)
Admission: EM | Admit: 2016-06-20 | Discharge: 2016-06-21 | Disposition: A | Discharge status: Home / Self Care | Payer: Medicaid Other | Attending: Emergency Medicine | Admitting: Emergency Medicine

## 2016-06-20 DIAGNOSIS — R079 Chest pain, unspecified: Secondary | ICD-10-CM | POA: Diagnosis present

## 2016-06-20 DIAGNOSIS — R0789 Other chest pain: Secondary | ICD-10-CM | POA: Diagnosis not present

## 2016-06-20 DIAGNOSIS — M542 Cervicalgia: Secondary | ICD-10-CM | POA: Insufficient documentation

## 2016-06-20 LAB — CBC
HCT: 48.9 % (ref 39.0–52.0)
HEMOGLOBIN: 15.8 g/dL (ref 13.0–17.0)
MCH: 27.6 pg (ref 26.0–34.0)
MCHC: 32.3 g/dL (ref 30.0–36.0)
MCV: 85.3 fL (ref 78.0–100.0)
PLATELETS: 275 10*3/uL (ref 150–400)
RBC: 5.73 MIL/uL (ref 4.22–5.81)
RDW: 14.3 % (ref 11.5–15.5)
WBC: 10.9 10*3/uL — ABNORMAL HIGH (ref 4.0–10.5)

## 2016-06-20 LAB — I-STAT TROPONIN, ED
TROPONIN I, POC: 0 ng/mL (ref 0.00–0.08)
TROPONIN I, POC: 0 ng/mL (ref 0.00–0.08)

## 2016-06-20 LAB — BASIC METABOLIC PANEL
ANION GAP: 9 (ref 5–15)
BUN: 12 mg/dL (ref 6–20)
CALCIUM: 9.8 mg/dL (ref 8.9–10.3)
CO2: 26 mmol/L (ref 22–32)
CREATININE: 0.98 mg/dL (ref 0.61–1.24)
Chloride: 102 mmol/L (ref 101–111)
Glucose, Bld: 93 mg/dL (ref 65–99)
Potassium: 4 mmol/L (ref 3.5–5.1)
Sodium: 137 mmol/L (ref 135–145)

## 2016-06-20 LAB — D-DIMER, QUANTITATIVE: D-Dimer, Quant: 0.49 ug/mL-FEU (ref 0.00–0.50)

## 2016-06-20 MED ORDER — OXYCODONE HCL 5 MG PO TABS
5.0000 mg | ORAL_TABLET | Freq: Once | ORAL | Status: AC
  Administered 2016-06-20: 5 mg via ORAL
  Filled 2016-06-20: qty 1

## 2016-06-20 NOTE — ED Provider Notes (Signed)
[facility] DEPT Provider Note   CSN: [id number] Arrival date & time: 06/20/16  1745  First Provider Contact:  None    By signing my name below, I, Julien Nordmann, attest that this documentation has been prepared under the direction and in the presence of [name], MD.  Electronically Signed: Julien Nordmann, ED Scribe. 06/20/16. 11:25 PM.    History   Chief Complaint Chief Complaint  Patient presents with  . Chest Pain    The history is provided by the patient. No language interpreter was used.   HPI Comments: [name] is a 30 y.o. male who has a PMHx of ADHD, GERD, hypercholesteremia and obesity presents to the Emergency Department complaining of sudden onset, intermittent gradual worsening, 7/10, left sided sharp chest pain that radiated into his left shoulder into his back onset 5 days ago. He notes associated shortness of breath, nausea, fever (101) x 1 day, numbness and tingling in his left upper extremity. Pt reports being seen at [facility] ED on Sunday for the same symptoms but was unable to be seen by a cardiologist in the hospital but was told to follow up outpatient with a cardiologist. He states that each episode lasts for a few hours. He says that sneezing, taking a deep breath and certain movement increases his pain. Pt has tried eat, ice, and relaxation to alleviate his symptoms with no relief. Pt reports having a hx of DVT in his left leg after ankle surgery ~ 10 years ago. He notes his father has a PMhx of CHF at age 63. Pt is a non-smoker. Pt denies cough, recent travel longer than 4 hours, hx of DM, hx of HTN, or hx of HLD.  Past Medical History:  Diagnosis Date  . ADHD (attention deficit hyperactivity disorder)   . Anxiety   . Bipolar disorder ([place])   . Chronic lower back pain   . Depression   . Esophagitis    Distal esophageal erosions consistent with mild erosive  reflux esophagitis 12/2008 by EGD   . Gastric ulcer   . GERD (gastroesophageal reflux  disease)   . Hiatal hernia   . History of stomach ulcers   . Hx MRSA infection 8/08   right thigh  . Hypercholesteremia   . Obesity   . Occult GI bleeding 12/2008   Trivial upper GI bleed/uncontrolled GERD by upper endoscopy 12/2008, normal f/u endoscopy 03/2009 with SBCE at that time  . OSA on CPAP   . Pneumonia 09/01/2015   "on ATB still" (09/04/2015)  . S/P colonoscopy 11/10, 10/08   Dr [name]  . Schizophrenia [facility])   . Thyroid function test abnormal    Noted in 2011 discharge  . Tobacco dipper     Patient Active Problem List   Diagnosis Date Noted  . Wound drainage 09/04/2015  . Seasonal and perennial allergic rhinitis 07/23/2014  . Abdominal mass of other site 06/15/2012  . Abdominal cramps 06/15/2012  . Obstructive sleep apnea 05/03/2012  . Syncope and collapse 05/03/2012  . Chest pain 05/03/2012  . Obesity, morbid ([place]) 05/03/2012  . Anal fissure 05/27/2011  . UNSPECIFIED ANEMIA 09/11/2009  . Blood in stool 04/06/2009  . ABDOMINAL PAIN OTHER SPECIFIED SITE 03/07/2009  . BIPOLAR DISORDER UNSPECIFIED 03/06/2009  . SMOKELESS TOBACCO ABUSE 03/06/2009  . ATTENTION DEFICIT HYPERACTIVITY DISORDER 03/06/2009  . GERD 03/06/2009  . NAUSEA AND VOMITING 03/06/2009  . DIARRHEA 03/06/2009    Past Surgical History:  Procedure Laterality Date  . ANKLE FRACTURE SURGERY Left ~  2008  . BACK SURGERY    . COLONOSCOPY  10/10/2009   anal papilla otherwise normal  . ESOPHAGOGASTRODUODENOSCOPY   04/07/2009   Normal esophagus, small hiatal hernia  . ESOPHAGOGASTRODUODENOSCOPY  01/09/2009   Distal esophageal erosions consistent with mild erosive reflux esophagitis, otherwise normal esophagus, small hiatal herniaotherwise normal stomach, D1-D2   . ESOPHAGOGASTRODUODENOSCOPY  08/26/2007   Normal esophagus, a small hiatal/hernia, otherwise normal stomach D1 through D3  . FRACTURE SURGERY    . ileocolonoscopy  08/26/2007    A normal rectum, colon, and terminal ileum  . INCISION  AND DRAINAGE  09/04/2015   "reopened my back incsion"  . LUMBAR MICRODISCECTOMY  08/21/2015  . LUMBAR WOUND DEBRIDEMENT N/A 09/04/2015   Procedure: LUMBAR WOUND DEBRIDEMENT;  Surgeon: Consuella Lose, MD;  Location: [facility];  Service: Neurosurgery;  Laterality: N/A;  . Small bowel capsule  04/11/2009    normal throughout  . VASECTOMY         Home Medications    Prior to Admission medications   Medication Sig Start Date End Date Taking? Authorizing Provider  citalopram (CELEXA) 20 MG tablet Take 20 mg by mouth 2 (two) times daily.     Historical Provider, MD  EPINEPHrine 0.3 mg/0.3 mL IJ SOAJ injection Inject 0.3 mg into the muscle once.    Historical Provider, MD  lamoTRIgine (LAMICTAL) 25 MG tablet Take 25 mg by mouth daily.    Historical Provider, MD  Oxcarbazepine (TRILEPTAL) 300 MG tablet Take 300 mg by mouth 2 (two) times daily.    Historical Provider, MD  pantoprazole (PROTONIX) 40 MG tablet Take 1 tablet (40 mg total) by mouth 2 (two) times daily. 06/19/16 10/03/17  Claretta Fraise, MD  traZODone (DESYREL) 50 MG tablet Take 50 mg by mouth at bedtime.    Historical Provider, MD    Family History Family History  Problem Relation Age of Onset  . Leukemia Father 71  . Seizures Mother   . Bipolar disorder Brother   . ADD / ADHD Brother   . Colon cancer Neg Hx     Social History Social History  Substance Use Topics  . Smoking status: Never Smoker  . Smokeless tobacco: Current User    Types: Snuff  . Alcohol use No     Allergies   Ibuprofen; Influenza vaccines; Tylenol [acetaminophen]; Bee venom; and Tramadol   Review of Systems Review of Systems  Constitutional: Positive for fever.  Respiratory: Positive for shortness of breath.   Cardiovascular: Positive for chest pain.  Gastrointestinal: Positive for nausea. Negative for vomiting.  Neurological: Positive for numbness.  All other systems reviewed and are negative.    Physical Exam Updated Vital  Signs BP 120/72   Pulse 69   Temp 97.4 F (36.3 C) (Oral)   Resp 21   SpO2 97%   Physical Exam  Constitutional: He is oriented to person, place, and time. He appears well-developed and well-nourished.  obese  HENT:  Head: Normocephalic and atraumatic.  Eyes: EOM are normal. Pupils are equal, round, and reactive to light.  Neck: Normal range of motion. Neck supple. No JVD present.  Mild tenderness mid cervical spine  Cardiovascular: Normal rate, regular rhythm, normal heart sounds and intact distal pulses.   No murmur heard. Pulmonary/Chest: Effort normal and breath sounds normal. He has no wheezes. He has no rales. He exhibits tenderness.  Mild tenderness left anterior chest wall  Abdominal: Soft. He exhibits no distension and no mass. There is no tenderness.  Musculoskeletal: Normal range of motion. He exhibits no edema.  Lymphadenopathy:    He has no cervical adenopathy.  Neurological: He is alert and oriented to person, place, and time. No cranial nerve deficit. He exhibits normal muscle tone. Coordination normal.  1 + PT edema  Skin: Skin is warm and dry. No rash noted.  Psychiatric: He has a normal mood and affect. His behavior is normal. Judgment and thought content normal.  Nursing note and vitals reviewed.    ED Treatments / Results  DIAGNOSTIC STUDIES: Oxygen Saturation is 97% on RA, normal by my interpretation.  COORDINATION OF CARE: 11:12 PM Will order D-Dimer and pain medication. Discussed treatment plan with pt at bedside and pt agreed to plan. Pt was told to follow up with cardiologist and pt agreed to plan.   Labs (all labs ordered are listed, but only abnormal results are displayed) Labs Reviewed  CBC - Abnormal; Notable for the following:       Result Value   WBC 10.9 (*)    All other components within normal limits  BASIC METABOLIC PANEL  D-DIMER, QUANTITATIVE (NOT AT [facility])  Randolm Idol, ED  I-STAT TROPOININ, ED    EKG  EKG  Interpretation  Date/Time:  Thursday June 20 2016 17:47:07 EDT Ventricular Rate:  93 PR Interval:  142 QRS Duration: 92 QT Interval:  350 QTC Calculation: 435 R Axis:   47 Text Interpretation:  Normal sinus rhythm Normal ECG Confirmed by Wilson Singer  MD, [place] ([id number]) on 06/20/2016 10:27:02 PM       Radiology Dg Chest 2 View  Result Date: 06/20/2016 CLINICAL DATA:  Left-sided chest pain EXAM: CHEST  2 VIEW COMPARISON:  06/16/2016 FINDINGS: The heart size and mediastinal contours are within normal limits. Both lungs are clear. The visualized skeletal structures are unremarkable. IMPRESSION: No active cardiopulmonary disease. Electronically Signed   By: Inez Catalina M.D.   On: 06/20/2016 18:26   Procedures Procedures (including critical care time)  Medications Ordered in ED Medications  oxyCODONE (Oxy IR/ROXICODONE) immediate release tablet 5 mg (not administered)     Initial Impression / Assessment and Plan / ED Course  I have reviewed the triage vital signs and the nursing notes.  Pertinent labs & imaging results that were available during my care of the patient were reviewed by me and considered in my medical decision making (see chart for details).  Clinical Course    Atypical chest pain which has now been ongoing for 5 days including today evaluation at another hospital. ECG continues to show no acute changes and troponin is negative. Patient does have history of DVT remotely of, so d-dimer is obtained which is negative. Repeat troponin is also normal. No indication of any serious cause of chest pain. No sign of ACS, pneumonia, pulmonary embolism. Old records are reviewed and he was seen in the ED on July 1 with chest wall pain. He had a negative stress test in 2013. Patient is given reassurance that he is referred to cardiology for consideration for repeat stress testing.  Final Clinical Impressions(s) / ED Diagnoses   Final diagnoses:  Atypical chest pain   I personally  performed the services described in this documentation, which was scribed in my presence. The recorded information has been reviewed and is accurate.    New Prescriptions New Prescriptions   No medications on file     [name], MD [id number]

## 2016-06-20 NOTE — ED Notes (Signed)
Lab at bedside

## 2016-06-20 NOTE — ED Triage Notes (Signed)
Pt here for chest pain that he reports started Sunday. Pt reports pain radiates into left arm with numbness and tingling in the hands. Pt also reports shortness of breath. No distress noted, resp e/u.

## 2016-06-20 NOTE — ED Notes (Signed)
Pt reported to nurse first he would like something for pain. Offered pt ibuprofen and percocet he reports he is allergic to tylenol and ibuprofen and refused.

## 2016-06-20 NOTE — ED Notes (Signed)
MD at bedside. 

## 2016-06-21 NOTE — Discharge Instructions (Signed)
Your EKG, chest x-ray, blood tests for heart enzyme, and blood test for blood clots were all normal. Please make a follow-up appointment with the cardiologist for further outpatient evaluation. Return to the ED if symptoms are worsening.

## 2016-06-24 ENCOUNTER — Telehealth: Payer: Self-pay | Admitting: Family Medicine

## 2016-06-24 NOTE — Telephone Encounter (Signed)
Lab reviewed. WS

## 2016-06-25 ENCOUNTER — Encounter: Payer: Self-pay | Admitting: Cardiology

## 2016-06-25 ENCOUNTER — Encounter: Payer: Self-pay | Admitting: *Deleted

## 2016-06-25 ENCOUNTER — Encounter (INDEPENDENT_AMBULATORY_CARE_PROVIDER_SITE_OTHER): Payer: Self-pay

## 2016-06-25 ENCOUNTER — Ambulatory Visit (INDEPENDENT_AMBULATORY_CARE_PROVIDER_SITE_OTHER): Payer: Medicaid Other | Admitting: Cardiology

## 2016-06-25 VITALS — BP 130/86 | HR 58 | Ht 72.0 in | Wt 371.0 lb

## 2016-06-25 DIAGNOSIS — F203 Undifferentiated schizophrenia: Secondary | ICD-10-CM

## 2016-06-25 DIAGNOSIS — R0789 Other chest pain: Secondary | ICD-10-CM | POA: Diagnosis not present

## 2016-06-25 NOTE — Progress Notes (Signed)
Cardiology Office Note    Date:  06/25/2016   ID:  KYRI CARPIO, DOB 24-Aug-1986, MRN [id number]  PCP:  No PCP Per Patient  Cardiologist:   Candee Furbish, MD     History of Present Illness:  [name] is a 30 y.o. male with history of hyperlipidemia, morbid obesity who is in the emergency department on 06/20/16 with chest pain radiating to his shoulder left side back 5 days onset that was sharp gradually worsening with some numbness and tingling in his left upper extremity as well and subjective fever of 101.  He was seen at the [facility] emergency department but was unable to be seen by a cardiologist in the hospital and was told to follow-up outpatient with a cardiologist. These episodes seem to last a couple hours in duration and that sneezing and deep breath and certain movement can increase the pain. Left hand went numb, tingles. He had low back surgery in September.  His father has heart failure at age 46. He also has a history of DVT in his left leg after ankle surgery approximately 10 years ago.  No diabetes, no hypertension, no hyperlipidemia,  Never smoked. 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 ADHD (attention deficit hyperactivity disorder)   . Anxiety   . Bipolar disorder ([place])   . Chronic lower back pain   . Depression   . Esophagitis    Distal esophageal erosions consistent with mild erosive  reflux esophagitis 12/2008 by EGD   . Gastric ulcer   . GERD (gastroesophageal reflux disease)   . Hiatal hernia   . History of stomach ulcers   . Hx MRSA infection 8/08   right thigh  . Hypercholesteremia   . Obesity   . Occult GI bleeding 12/2008   Trivial upper GI bleed/uncontrolled GERD by upper endoscopy 12/2008, normal f/u endoscopy 03/2009 with SBCE at that time  . OSA on CPAP   . Pneumonia 09/01/2015   "on ATB still" (09/04/2015)  . S/P colonoscopy 11/10, 10/08   Dr [name]  . Schizophrenia [facility])    . Thyroid function test abnormal    Noted in 2011 discharge  . Tobacco dipper     Past Surgical History:  Procedure Laterality Date  . ANKLE FRACTURE SURGERY Left ~ 2008  . BACK SURGERY    . COLONOSCOPY  10/10/2009   anal papilla otherwise normal  . ESOPHAGOGASTRODUODENOSCOPY   04/07/2009   Normal esophagus, small hiatal hernia  . ESOPHAGOGASTRODUODENOSCOPY  01/09/2009   Distal esophageal erosions consistent with mild erosive reflux esophagitis, otherwise normal esophagus, small hiatal herniaotherwise normal stomach, D1-D2   . ESOPHAGOGASTRODUODENOSCOPY  08/26/2007   Normal esophagus, a small hiatal/hernia, otherwise normal stomach D1 through D3  . FRACTURE SURGERY    . ileocolonoscopy  08/26/2007    A normal rectum, colon, and terminal ileum  . INCISION AND DRAINAGE  09/04/2015   "reopened my back incsion"  . LUMBAR MICRODISCECTOMY  08/21/2015  . LUMBAR WOUND DEBRIDEMENT N/A 09/04/2015   Procedure: LUMBAR WOUND DEBRIDEMENT;  Surgeon: Consuella Lose, MD;  Location: [facility];  Service: Neurosurgery;  Laterality: N/A;  . Small bowel capsule  04/11/2009    normal throughout  . VASECTOMY      Current Medications: Outpatient Medications Prior to Visit  Medication Sig Dispense Refill  . citalopram (CELEXA) 20 MG tablet Take 20 mg by mouth 2 (two) times daily.     [name] Kitchen  EPINEPHrine 0.3 mg/0.3 mL IJ SOAJ injection Inject 0.3 mg into the muscle once.    . lamoTRIgine (LAMICTAL) 25 MG tablet Take 25 mg by mouth daily.    . Oxcarbazepine (TRILEPTAL) 300 MG tablet Take 300 mg by mouth 2 (two) times daily.    . pantoprazole (PROTONIX) 40 MG tablet Take 1 tablet (40 mg total) by mouth 2 (two) times daily. 60 tablet 11  . traZODone (DESYREL) 50 MG tablet Take 50 mg by mouth at bedtime.     No facility-administered medications prior to visit.      Allergies:   Ibuprofen; Influenza vaccines; Tylenol [acetaminophen]; Bee venom; and Tramadol   Social History   Social History  .  Marital status: Married    Spouse name: N/A  . Number of children: 2  . Years of education: N/A   Occupational History  . [place] [place]  . service tech    Social History Main Topics  . Smoking status: Never Smoker  . Smokeless tobacco: Current User    Types: Snuff  . Alcohol use No  . Drug use: No  . Sexual activity: Not Currently   Other Topics Concern  . None   Social History Narrative  . None     Family History:  The patient's family history includes ADD / ADHD in his brother; Bipolar disorder in his brother; Leukemia (age of onset: 29) in his father; Seizures in his mother.   ROS:   Please see the history of present illness.    ROS All other systems reviewed and are negative.   PHYSICAL EXAM:   VS:  BP 130/86   Pulse (!) 58   Ht 6' (1.829 m)   Wt (!) 371 lb (168.3 kg)   BMI 50.32 kg/m    GEN: Well nourished, well developed, in no acute distress  HEENT: normal  Neck: no JVD, carotid bruits, or masses Cardiac: RRR; no murmurs, rubs, or gallops,no edema  Respiratory:  clear to auscultation bilaterally, normal work of breathing GI: soft, nontender, nondistended, + BS, obese MS: no deformity or atrophy  Skin: warm and dry, no rash Neuro:  Alert and Oriented x 3, Strength and sensation are intact Psych: euthymic mood, full affect  Wt Readings from Last 3 Encounters:  06/25/16 (!) 371 lb (168.3 kg)  06/19/16 (!) 374 lb (169.6 kg)  05/25/16 (!) 376 lb 4.8 oz (170.7 kg)      Studies/Labs Reviewed:   EKG:  EKG is ordered today.  06/25/16-sinus rhythm, 61, no other abnormalities. 06/22/16-sinus rhythm, no other abnormalities.  Recent Labs: 05/25/2016: ALT 27 06/20/2016: BUN 12; Creatinine, Ser 0.98; Hemoglobin 15.8; Platelets 275; Potassium 4.0; Sodium 137   Lipid Panel    Component Value Date/Time   CHOL 151 08/19/2013 0918   TRIG 143 08/19/2013 0918   HDL 41 08/19/2013 0918   CHOLHDL 3.7 08/19/2013 0918   LDLCALC 81 08/19/2013 0918     Additional studies/ records that were reviewed today include:  ER records reviewed, lab work, EKG  D-dimer was negative, troponin was normal, negative stress test in 2013-exercise treadmill test    ASSESSMENT:    1. Atypical chest pain   2. Morbid obesity due to excess calories ([place])   3. Undifferentiated schizophrenia ([place])      PLAN:  In order of problems listed above:  Atypical chest pain  - Reassuring workup in the emergency department with negative d-dimer, negative troponin especially with history of prior DVT which was provoked  by ankle surgery approximate 10 years ago.  - Stress test in 2013 was low risk   - I think that his pain is most likely musculoskeletal given its quality and change with movement, deep inspiration. Unfortunately, he cannot take Advil or ibuprofen as he has had prior peptic ulcer disease. Tylenol may be helpful for pain for him.  - With his left hand numbness and left arm numbness, this may be a cervical radiculopathy. He has had prior lower back surgery in September. I asked him to address this with his neurosurgeon. I do not think this is cardiac in origin. His EKG is unremarkable. No evidence of pericarditis.  History of schizophrenia  - Per behavioral health, Lamictal  Morbid obesity  - Continue to encourage weight loss, decrease carbohydrates   Medication Adjustments/Labs and Tests Ordered: Current medicines are reviewed at length with the patient today.  Concerns regarding medicines are outlined above.  Medication changes, Labs and Tests ordered today are listed in the Patient Instructions below. Patient Instructions  Medication Instructions:  The current medical regimen is effective;  continue present plan and medications.  Follow-Up: Follow up as needed with Dr Marlou Porch.  Thank you for choosing [facility]!!         Signed, Candee Furbish, MD  06/25/2016 10:20 AM    [facility] [address],  [place], [place]  [address] Phone: [phone]; Fax: [phone]

## 2016-06-25 NOTE — Patient Instructions (Signed)
Medication Instructions:  The current medical regimen is effective;  continue present plan and medications.  Follow-Up: Follow up as needed with Dr Skains.  Thank you for choosing [facility]!!     

## 2016-06-26 ENCOUNTER — Encounter: Payer: Self-pay | Admitting: Cardiology

## 2016-07-05 NOTE — Addendum Note (Signed)
Addended by: Freada Bergeron on: 07/05/2016 02:12 PM   Modules accepted: Orders

## 2016-07-26 ENCOUNTER — Other Ambulatory Visit: Payer: Self-pay | Admitting: Nurse Practitioner

## 2016-07-26 DIAGNOSIS — K219 Gastro-esophageal reflux disease without esophagitis: Secondary | ICD-10-CM

## 2016-09-08 ENCOUNTER — Emergency Department (HOSPITAL_BASED_OUTPATIENT_CLINIC_OR_DEPARTMENT_OTHER): Payer: Medicaid Other

## 2016-09-08 ENCOUNTER — Encounter (HOSPITAL_BASED_OUTPATIENT_CLINIC_OR_DEPARTMENT_OTHER): Payer: Self-pay | Admitting: Emergency Medicine

## 2016-09-08 ENCOUNTER — Emergency Department (HOSPITAL_BASED_OUTPATIENT_CLINIC_OR_DEPARTMENT_OTHER)
Admission: EM | Admit: 2016-09-08 | Discharge: 2016-09-08 | Disposition: A | Discharge status: Home / Self Care | Payer: Medicaid Other | Attending: Emergency Medicine | Admitting: Emergency Medicine

## 2016-09-08 DIAGNOSIS — M25561 Pain in right knee: Secondary | ICD-10-CM | POA: Diagnosis not present

## 2016-09-08 DIAGNOSIS — Z720 Tobacco use: Secondary | ICD-10-CM | POA: Insufficient documentation

## 2016-09-08 DIAGNOSIS — Z79899 Other long term (current) drug therapy: Secondary | ICD-10-CM | POA: Diagnosis not present

## 2016-09-08 MED ORDER — OXYCODONE-ACETAMINOPHEN 5-325 MG PO TABS
1.0000 | ORAL_TABLET | Freq: Once | ORAL | Status: DC
  Filled 2016-09-08: qty 1

## 2016-09-08 MED ORDER — HYDROMORPHONE HCL 1 MG/ML IJ SOLN
1.0000 mg | Freq: Once | INTRAMUSCULAR | Status: AC
  Administered 2016-09-08: 1 mg via INTRAMUSCULAR
  Filled 2016-09-08: qty 1

## 2016-09-08 NOTE — ED Provider Notes (Signed)
[place] DEPT [facility] Provider Note   CSN: [id number] Arrival date & time: 09/08/16  1756 By signing my name below, I, Georgette Shell, attest that this documentation has been prepared under the direction and in the presence of Gloriann Loan, PA-C. Electronically Signed: Georgette Shell, ED Scribe. 09/08/16. 7:20 PM.  History   Chief Complaint Chief Complaint  Patient presents with  . Knee Pain   HPI Comments: [name] is a 30 y.o. male who presents to the Emergency Department complaining of gradually worsening, aching, 7/10 right knee pain onset 2 days ago. He denies any recent injury or trauma. Pain is exacerbated with palpation and ambulating. He notes he hears a popping sound when bending the knee. He has applied a heating pad to the area with no relief to the pain. Pt denies fever, numbness, or any other associated symptoms. Currently seen at pain clinic for chronic low back pain.  Allergic to tylenol.  Hx of gastric ulcers and cannot take NSAIDs.   The history is provided by the patient. No language interpreter was used.    Past Medical History:  Diagnosis Date  . ADHD (attention deficit hyperactivity disorder)   . Anxiety   . Bipolar disorder ([place])   . Chronic lower back pain   . Depression   . Esophagitis    Distal esophageal erosions consistent with mild erosive  reflux esophagitis 12/2008 by EGD   . Gastric ulcer   . GERD (gastroesophageal reflux disease)   . Hiatal hernia   . History of stomach ulcers   . Hx MRSA infection 8/08   right thigh  . Hypercholesteremia   . Obesity   . Occult GI bleeding 12/2008   Trivial upper GI bleed/uncontrolled GERD by upper endoscopy 12/2008, normal f/u endoscopy 03/2009 with SBCE at that time  . OSA on CPAP   . Pneumonia 09/01/2015   "on ATB still" (09/04/2015)  . S/P colonoscopy 11/10, 10/08   Dr [name]  . Schizophrenia [facility])   . Thyroid function test abnormal    Noted in 2011 discharge  . Tobacco dipper     Patient  Active Problem List   Diagnosis Date Noted  . Wound drainage 09/04/2015  . Seasonal and perennial allergic rhinitis 07/23/2014  . Abdominal mass of other site 06/15/2012  . Abdominal cramps 06/15/2012  . Obstructive sleep apnea 05/03/2012  . Syncope and collapse 05/03/2012  . Chest pain 05/03/2012  . Obesity, morbid ([place]) 05/03/2012  . Anal fissure 05/27/2011  . UNSPECIFIED ANEMIA 09/11/2009  . Blood in stool 04/06/2009  . ABDOMINAL PAIN OTHER SPECIFIED SITE 03/07/2009  . BIPOLAR DISORDER UNSPECIFIED 03/06/2009  . SMOKELESS TOBACCO ABUSE 03/06/2009  . ATTENTION DEFICIT HYPERACTIVITY DISORDER 03/06/2009  . GERD 03/06/2009  . NAUSEA AND VOMITING 03/06/2009  . DIARRHEA 03/06/2009    Past Surgical History:  Procedure Laterality Date  . ANKLE FRACTURE SURGERY Left ~ 2008  . BACK SURGERY    . COLONOSCOPY  10/10/2009   anal papilla otherwise normal  . ESOPHAGOGASTRODUODENOSCOPY   04/07/2009   Normal esophagus, small hiatal hernia  . ESOPHAGOGASTRODUODENOSCOPY  01/09/2009   Distal esophageal erosions consistent with mild erosive reflux esophagitis, otherwise normal esophagus, small hiatal herniaotherwise normal stomach, D1-D2   . ESOPHAGOGASTRODUODENOSCOPY  08/26/2007   Normal esophagus, a small hiatal/hernia, otherwise normal stomach D1 through D3  . FRACTURE SURGERY    . ileocolonoscopy  08/26/2007    A normal rectum, colon, and terminal ileum  . INCISION AND DRAINAGE  09/04/2015   "  reopened my back incsion"  . LUMBAR MICRODISCECTOMY  08/21/2015  . LUMBAR WOUND DEBRIDEMENT N/A 09/04/2015   Procedure: LUMBAR WOUND DEBRIDEMENT;  Surgeon: Consuella Lose, MD;  Location: [place] NEURO ORS;  Service: Neurosurgery;  Laterality: N/A;  . Small bowel capsule  04/11/2009    normal throughout  . VASECTOMY         Home Medications    Prior to Admission medications   Medication Sig Start Date End Date Taking? Authorizing Provider  doxepin (SINEQUAN) 10 MG capsule Take 10 mg by mouth.    Yes Historical Provider, MD  EPINEPHrine 0.3 mg/0.3 mL IJ SOAJ injection Inject 0.3 mg into the muscle once.   Yes Historical Provider, MD  gabapentin (NEURONTIN) 300 MG capsule Take 300 mg by mouth 3 (three) times daily.   Yes Historical Provider, MD  lamoTRIgine (LAMICTAL) 25 MG tablet Take 25 mg by mouth daily.   Yes Historical Provider, MD  Oxcarbazepine (TRILEPTAL) 300 MG tablet Take 300 mg by mouth 2 (two) times daily.   Yes Historical Provider, MD  pantoprazole (PROTONIX) 40 MG tablet Take 1 tablet (40 mg total) by mouth 2 (two) times daily. 06/19/16 10/03/17 Yes Claretta Fraise, MD  pantoprazole (PROTONIX) 40 MG tablet TAKE ONE TABLET BY MOUTH ONCE DAILY 07/30/16  Yes Carlis Stable, NP  traZODone (DESYREL) 50 MG tablet Take 50 mg by mouth at bedtime.   Yes Historical Provider, MD  citalopram (CELEXA) 20 MG tablet Take 20 mg by mouth 2 (two) times daily.     Historical Provider, MD    Family History Family History  Problem Relation Age of Onset  . Leukemia Father 25  . Seizures Mother   . Bipolar disorder Brother   . ADD / ADHD Brother   . Colon cancer Neg Hx     Social History Social History  Substance Use Topics  . Smoking status: Never Smoker  . Smokeless tobacco: Current User    Types: Snuff  . Alcohol use No     Allergies   Ibuprofen; Influenza vaccines; Tylenol [acetaminophen]; Bee venom; and Tramadol   Review of Systems Review of Systems  Constitutional: Negative for fever.  Musculoskeletal: Positive for arthralgias.  Neurological: Negative for numbness.  All other systems reviewed and are negative.    Physical Exam Updated Vital Signs BP 108/88 (BP Location: Left Arm)   Pulse 81   Temp 98.2 F (36.8 C) (Oral)   Resp 20   Ht 6' (1.829 m)   Wt (!) 163.3 kg   SpO2 100%   BMI 48.82 kg/m   Physical Exam  Constitutional: He is oriented to person, place, and time. He appears well-developed and well-nourished.  HENT:  Head: Normocephalic and atraumatic.    Right Ear: External ear normal.  Left Ear: External ear normal.  Eyes: Conjunctivae are normal. No scleral icterus.  Neck: No tracheal deviation present.  Pulmonary/Chest: Effort normal. No respiratory distress.  Abdominal: He exhibits no distension.  Musculoskeletal: He exhibits tenderness. He exhibits no edema or deformity.       Right knee: He exhibits decreased range of motion (due to pain). He exhibits no swelling, no effusion, no erythema and normal patellar mobility. Tenderness found. Lateral joint line and patellar tendon tenderness noted.  Right knee without erythema, warmth, swelling, or bruising.  FAROM, but mildly painful.  Full PROM without pain.  No ligamentous laxity.   Neurological: He is alert and oriented to person, place, and time.  Normal strength and sensation throughout lower  extremities bilaterally.   Skin: Skin is warm and dry.  Psychiatric: He has a normal mood and affect. His behavior is normal.     ED Treatments / Results  DIAGNOSTIC STUDIES: Oxygen Saturation is 100% on RA, normal by my interpretation.    COORDINATION OF CARE: 7:20 PM Discussed treatment plan with pt at bedside which includes x-ray and pt agreed to plan.  Labs (all labs ordered are listed, but only abnormal results are displayed) Labs Reviewed - No data to display  EKG  EKG Interpretation None       Radiology Dg Knee Complete 4 Views Right  Result Date: 09/08/2016 CLINICAL DATA:  Right knee pain, pain T9 patella. Popping and clicking with flexion and extension. EXAM: RIGHT KNEE - COMPLETE 4+ VIEW COMPARISON:  None. FINDINGS: No evidence of fracture, dislocation, or joint effusion. Slight femorotibial joint space narrowing along the medial aspect. Minimal spurring of the tibial eminence. Minimal spurring off the lateral patellar facet. Soft tissues are unremarkable. IMPRESSION: No acute osseous abnormality. Tiny spur off the lateral patella. Minimal spurring also noted of the tibial  spine. Electronically Signed   By: Ashley Royalty M.D.   On: 09/08/2016 18:35    Procedures Procedures (including critical care time)  Medications Ordered in ED Medications  HYDROmorphone (DILAUDID) injection 1 mg (1 mg Intramuscular Given 09/08/16 1949)     Initial Impression / Assessment and Plan / ED Course  I have reviewed the triage vital signs and the nursing notes.  Pertinent labs & imaging results that were available during my care of the patient were reviewed by me and considered in my medical decision making (see chart for details).  Clinical Course   Patient X-Ray negative for obvious fracture or dislocation. Possible patellar tendonitis.  Pt advised to follow up with orthopedics. Patient given ACE wrap while in ED, conservative therapy recommended and discussed. Follow up with pain clinic for pain management.  Recommend heat.  Patient has orthopedist for follow up. Patient will be discharged home & is agreeable with above plan. Returns precautions discussed. Pt appears safe for discharge.   Final Clinical Impressions(s) / ED Diagnoses   Final diagnoses:  Acute pain of right knee    New Prescriptions Discharge Medication List as of 09/08/2016  8:32 PM     I personally performed the services described in this documentation, which was scribed in my presence. The recorded information has been reviewed and is accurate.     Gloriann Loan, PA-C 09/09/16 [place], MD 09/09/16 1324

## 2016-09-08 NOTE — Discharge Instructions (Signed)
Your xray today showed a tiny spur off the lateral patella and minimal spurring also noted of the tibial spine.  However, this is not likely the cause of your pain today.  It could be tendinitis.  Apply heat three times daily for 10 minutes.  Follow up with your orthopedist and pain clinic.  Return to the ED if you have fever, increased swelling, redness, or are unable to walk.

## 2016-09-08 NOTE — ED Triage Notes (Signed)
R knee pain x 2 days, denies injury.

## 2016-09-08 NOTE — ED Notes (Signed)
md made aware pt states he is allergic to tylenol

## 2016-09-09 ENCOUNTER — Encounter (HOSPITAL_COMMUNITY): Payer: Self-pay | Admitting: Emergency Medicine

## 2016-09-09 ENCOUNTER — Emergency Department (HOSPITAL_COMMUNITY)
Admission: EM | Admit: 2016-09-09 | Discharge: 2016-09-09 | Disposition: A | Discharge status: Home / Self Care | Payer: Medicaid Other | Attending: Emergency Medicine | Admitting: Emergency Medicine

## 2016-09-09 ENCOUNTER — Telehealth: Payer: Self-pay | Admitting: Family Medicine

## 2016-09-09 DIAGNOSIS — Y999 Unspecified external cause status: Secondary | ICD-10-CM | POA: Diagnosis not present

## 2016-09-09 DIAGNOSIS — S8391XA Sprain of unspecified site of right knee, initial encounter: Secondary | ICD-10-CM | POA: Insufficient documentation

## 2016-09-09 DIAGNOSIS — Y939 Activity, unspecified: Secondary | ICD-10-CM | POA: Diagnosis not present

## 2016-09-09 DIAGNOSIS — F909 Attention-deficit hyperactivity disorder, unspecified type: Secondary | ICD-10-CM | POA: Insufficient documentation

## 2016-09-09 DIAGNOSIS — X58XXXA Exposure to other specified factors, initial encounter: Secondary | ICD-10-CM | POA: Diagnosis not present

## 2016-09-09 DIAGNOSIS — Y929 Unspecified place or not applicable: Secondary | ICD-10-CM | POA: Insufficient documentation

## 2016-09-09 DIAGNOSIS — S8991XA Unspecified injury of right lower leg, initial encounter: Secondary | ICD-10-CM | POA: Diagnosis present

## 2016-09-09 MED ORDER — OXYCODONE HCL 5 MG PO TABS
5.0000 mg | ORAL_TABLET | Freq: Once | ORAL | Status: AC
  Administered 2016-09-09: 5 mg via ORAL
  Filled 2016-09-09: qty 1

## 2016-09-09 NOTE — Discharge Planning (Signed)
EDCM reviewed discharging chart for possible CM needs.  No needs identified.    

## 2016-09-09 NOTE — ED Provider Notes (Signed)
[place] DEPT Provider Note   CSN: [id number] Arrival date & time: 09/09/16  [id number]     History   Chief Complaint Chief Complaint  Patient presents with  . Knee Pain    HPI [name] is a 30 y.o. male.  HPI Phillip VESSEY is a 30 y.o. male who presents to ED with complaint of right knee pain. Pt states he has had gradually worsening right knee pain over the last 3 days. He works as a Geologist, engineering. Denies any injuries. Denies any fever or chills. No prior knee issues. No numbness or weakness distal to the knee. Went to [facility] ED yesterday, had xray which showed some spurring with no acute findings. Was referred to orthopedics. Pt states today pain is worse, and unable to walk on that leg. Denies hx of the same. No hx of gout. No other complaints. Pt has been putting heating pads, and has been working daily.  Past Medical History:  Diagnosis Date  . ADHD (attention deficit hyperactivity disorder)   . Anxiety   . Bipolar disorder ([place])   . Chronic lower back pain   . Depression   . Esophagitis    Distal esophageal erosions consistent with mild erosive  reflux esophagitis 12/2008 by EGD   . Gastric ulcer   . GERD (gastroesophageal reflux disease)   . Hiatal hernia   . History of stomach ulcers   . Hx MRSA infection 8/08   right thigh  . Hypercholesteremia   . Obesity   . Occult GI bleeding 12/2008   Trivial upper GI bleed/uncontrolled GERD by upper endoscopy 12/2008, normal f/u endoscopy 03/2009 with SBCE at that time  . OSA on CPAP   . Pneumonia 09/01/2015   "on ATB still" (09/04/2015)  . S/P colonoscopy 11/10, 10/08   Dr [name]  . Schizophrenia [facility])   . Thyroid function test abnormal    Noted in 2011 discharge  . Tobacco dipper     Patient Active Problem List   Diagnosis Date Noted  . Wound drainage 09/04/2015  . Seasonal and perennial allergic rhinitis 07/23/2014  . Abdominal mass of other site 06/15/2012  . Abdominal cramps 06/15/2012  .  Obstructive sleep apnea 05/03/2012  . Syncope and collapse 05/03/2012  . Chest pain 05/03/2012  . Obesity, morbid ([place]) 05/03/2012  . Anal fissure 05/27/2011  . UNSPECIFIED ANEMIA 09/11/2009  . Blood in stool 04/06/2009  . ABDOMINAL PAIN OTHER SPECIFIED SITE 03/07/2009  . BIPOLAR DISORDER UNSPECIFIED 03/06/2009  . SMOKELESS TOBACCO ABUSE 03/06/2009  . ATTENTION DEFICIT HYPERACTIVITY DISORDER 03/06/2009  . GERD 03/06/2009  . NAUSEA AND VOMITING 03/06/2009  . DIARRHEA 03/06/2009    Past Surgical History:  Procedure Laterality Date  . ANKLE FRACTURE SURGERY Left ~ 2008  . BACK SURGERY    . COLONOSCOPY  10/10/2009   anal papilla otherwise normal  . ESOPHAGOGASTRODUODENOSCOPY   04/07/2009   Normal esophagus, small hiatal hernia  . ESOPHAGOGASTRODUODENOSCOPY  01/09/2009   Distal esophageal erosions consistent with mild erosive reflux esophagitis, otherwise normal esophagus, small hiatal herniaotherwise normal stomach, D1-D2   . ESOPHAGOGASTRODUODENOSCOPY  08/26/2007   Normal esophagus, a small hiatal/hernia, otherwise normal stomach D1 through D3  . FRACTURE SURGERY    . ileocolonoscopy  08/26/2007    A normal rectum, colon, and terminal ileum  . INCISION AND DRAINAGE  09/04/2015   "reopened my back incsion"  . LUMBAR MICRODISCECTOMY  08/21/2015  . LUMBAR WOUND DEBRIDEMENT N/A 09/04/2015   Procedure: LUMBAR WOUND DEBRIDEMENT;  Surgeon: Consuella Lose, MD;  Location: [place] NEURO ORS;  Service: Neurosurgery;  Laterality: N/A;  . Small bowel capsule  04/11/2009    normal throughout  . VASECTOMY         Home Medications    Prior to Admission medications   Medication Sig Start Date End Date Taking? Authorizing Provider  citalopram (CELEXA) 20 MG tablet Take 40 mg by mouth daily.    Yes Historical Provider, MD  cyclobenzaprine (FLEXERIL) 10 MG tablet Take 10 mg by mouth 3 (three) times daily as needed for muscle spasms.   Yes Historical Provider, MD  doxepin (SINEQUAN) 10 MG  capsule Take 10 mg by mouth.   Yes Historical Provider, MD  gabapentin (NEURONTIN) 300 MG capsule Take 300 mg by mouth 3 (three) times daily.   Yes Historical Provider, MD  lamoTRIgine (LAMICTAL) 25 MG tablet Take 25 mg by mouth daily.   Yes Historical Provider, MD  Oxcarbazepine (TRILEPTAL) 300 MG tablet Take 300 mg by mouth 2 (two) times daily.   Yes Historical Provider, MD  oxycodone (OXY-IR) 5 MG capsule Take 5 mg by mouth every 4 (four) hours as needed.   Yes Historical Provider, MD  pantoprazole (PROTONIX) 40 MG tablet Take 1 tablet (40 mg total) by mouth 2 (two) times daily. 06/19/16 10/03/17 Yes Claretta Fraise, MD  traZODone (DESYREL) 50 MG tablet Take 50 mg by mouth at bedtime.   Yes Historical Provider, MD  EPINEPHrine 0.3 mg/0.3 mL IJ SOAJ injection Inject 0.3 mg into the muscle once.    Historical Provider, MD  pantoprazole (PROTONIX) 40 MG tablet TAKE ONE TABLET BY MOUTH ONCE DAILY Patient not taking: Reported on 09/09/2016 07/30/16   Carlis Stable, NP    Family History Family History  Problem Relation Age of Onset  . Leukemia Father 74  . Seizures Mother   . Bipolar disorder Brother   . ADD / ADHD Brother   . Colon cancer Neg Hx     Social History Social History  Substance Use Topics  . Smoking status: Never Smoker  . Smokeless tobacco: Current User    Types: Snuff  . Alcohol use No     Allergies   Ibuprofen; Influenza vaccines; Tylenol [acetaminophen]; Bee venom; and Tramadol   Review of Systems Review of Systems  Constitutional: Negative for chills and fever.  Respiratory: Negative for cough, chest tightness and shortness of breath.   Cardiovascular: Negative for chest pain, palpitations and leg swelling.  Genitourinary: Negative for dysuria, frequency, hematuria and urgency.  Musculoskeletal: Positive for arthralgias and joint swelling. Negative for myalgias, neck pain and neck stiffness.  Skin: Negative for rash.  Allergic/Immunologic: Negative for  immunocompromised state.  Neurological: Negative for dizziness, weakness, light-headedness, numbness and headaches.     Physical Exam Updated Vital Signs BP (!) 99/53   Pulse 79   Temp 97.5 F (36.4 C) (Oral)   Resp 17   Ht 6' (1.829 m)   Wt (!) 163.3 kg   SpO2 98%   BMI 48.82 kg/m   Physical Exam  Constitutional: He appears well-developed and well-nourished. No distress.  Eyes: Conjunctivae are normal.  Neck: Neck supple.  Cardiovascular: Normal rate.   Pulmonary/Chest: No respiratory distress.  Abdominal: He exhibits no distension.  Musculoskeletal:  Normal-appearing right knee compared to the left. No obvious swelling. No erythema over the knee. Tender to palpation over superior patella tendon and inferior to patella tendon. Pain with full flexion, able to fully extend the knee, able to lift  leg off the stretcher. Patella tendon is intact. Negative anterior-posterior drawer signs. No laxity with medial lateral stress. Dorsal pedal pulses intact. No calf pain or tenderness. Negative homans sign  Skin: Skin is warm and dry.  Nursing note and vitals reviewed.    ED Treatments / Results  Labs (all labs ordered are listed, but only abnormal results are displayed) Labs Reviewed - No data to display  EKG  EKG Interpretation None       Radiology Dg Knee Complete 4 Views Right  Result Date: 09/08/2016 CLINICAL DATA:  Right knee pain, pain T9 patella. Popping and clicking with flexion and extension. EXAM: RIGHT KNEE - COMPLETE 4+ VIEW COMPARISON:  None. FINDINGS: No evidence of fracture, dislocation, or joint effusion. Slight femorotibial joint space narrowing along the medial aspect. Minimal spurring of the tibial eminence. Minimal spurring off the lateral patellar facet. Soft tissues are unremarkable. IMPRESSION: No acute osseous abnormality. Tiny spur off the lateral patella. Minimal spurring also noted of the tibial spine. Electronically Signed   By: Ashley Royalty M.D.    On: 09/08/2016 18:35    Procedures Procedures (including critical care time)  Medications Ordered in ED Medications  oxyCODONE (Oxy IR/ROXICODONE) immediate release tablet 5 mg (not administered)     Initial Impression / Assessment and Plan / ED Course  I have reviewed the triage vital signs and the nursing notes.  Pertinent labs & imaging results that were available during my care of the patient were reviewed by me and considered in my medical decision making (see chart for details).  Clinical Course   Patient with persistent gradually worsening right knee pain. No evidence of infection based on exam. Joint is stable. X-ray was obtained yesterday and showed no acute abnormality, tiny spur of the patella and tibial spine noted. Patient's pain is mainly over her patella tendon. Question patella tendinitis first patella bursitis. Given increased in pain, will place an immobilizer. Patient states he has crutches at home. We will discharge home with orthopedics follow-up. Return precautions discussed.  Vitals:   09/09/16 1003 09/09/16 1015 09/09/16 1045 09/09/16 1105  BP: 110/64 (!) 99/53 (!) 106/52 107/68  Pulse: 76 79 62 83  Resp: 17   16  Temp:    97.7 F (36.5 C)  TempSrc:    Oral  SpO2: 100% 98% 97% 98%  Weight:      Height:          Final Clinical Impressions(s) / ED Diagnoses   Final diagnoses:  Sprain of right knee, unspecified ligament, initial encounter    New Prescriptions Discharge Medication List as of 09/09/2016 10:50 AM       Jeannett Senior, PA-C 09/09/16 [place], MD 09/09/16 2213

## 2016-09-09 NOTE — Progress Notes (Signed)
Orthopedic Tech Progress Note Patient Details:  [name] 06-10-1986 [id number]  Ortho Devices Type of Ortho Device: Knee Immobilizer, Crutches Ortho Device/Splint Interventions: Application   [name] 09/09/2016, 11:23 AM

## 2016-09-09 NOTE — Discharge Instructions (Signed)
Ice and elevate your knee when at home. Keep it an immobilizer to prevent from bending. Use crutches for ambulation. Please follow-up with orthopedics if not improving. See print outs below for more information.

## 2016-09-09 NOTE — ED Notes (Signed)
Ortho at bedside.

## 2016-09-09 NOTE — ED Triage Notes (Signed)
Pt complaining of right knee pain since last thurs. Went to [facility]- xray inconclusive. Pt was told he needs MRI. This morning pt states right knee is swollen and he is unable to put weight on it.

## 2016-09-09 NOTE — ED Notes (Addendum)
Pt states that he was given an ace wrap last night at [facility]. He states that when he was wearing it he had a limited amount of pain relief.

## 2016-09-10 ENCOUNTER — Other Ambulatory Visit: Payer: Self-pay | Admitting: *Deleted

## 2016-09-10 DIAGNOSIS — M25569 Pain in unspecified knee: Secondary | ICD-10-CM

## 2016-09-10 NOTE — Telephone Encounter (Signed)
Referral placed.

## 2016-09-10 NOTE — Telephone Encounter (Signed)
Please refer as requested 

## 2016-09-10 NOTE — Telephone Encounter (Signed)
Please address

## 2016-09-24 ENCOUNTER — Other Ambulatory Visit: Payer: Self-pay | Admitting: Family Medicine

## 2016-09-24 MED ORDER — LAMOTRIGINE 100 MG PO TABS: 100.0000 mg | ORAL_TABLET | Freq: Every day | ORAL | 5 refills | Status: DC

## 2016-11-18 ENCOUNTER — Emergency Department (HOSPITAL_COMMUNITY)
Admission: EM | Admit: 2016-11-18 | Discharge: 2016-11-18 | Disposition: A | Discharge status: Home / Self Care | Payer: Medicaid Other | Attending: Emergency Medicine | Admitting: Emergency Medicine

## 2016-11-18 ENCOUNTER — Encounter (HOSPITAL_COMMUNITY): Payer: Self-pay | Admitting: Emergency Medicine

## 2016-11-18 DIAGNOSIS — Z79899 Other long term (current) drug therapy: Secondary | ICD-10-CM | POA: Diagnosis not present

## 2016-11-18 DIAGNOSIS — F909 Attention-deficit hyperactivity disorder, unspecified type: Secondary | ICD-10-CM | POA: Insufficient documentation

## 2016-11-18 DIAGNOSIS — B349 Viral infection, unspecified: Secondary | ICD-10-CM

## 2016-11-18 DIAGNOSIS — R05 Cough: Secondary | ICD-10-CM | POA: Diagnosis present

## 2016-11-18 DIAGNOSIS — F17290 Nicotine dependence, other tobacco product, uncomplicated: Secondary | ICD-10-CM | POA: Insufficient documentation

## 2016-11-18 MED ORDER — ONDANSETRON 8 MG PO TBDP
8.0000 mg | ORAL_TABLET | Freq: Once | ORAL | Status: AC
  Administered 2016-11-18: 8 mg via ORAL
  Filled 2016-11-18: qty 1

## 2016-11-18 MED ORDER — ONDANSETRON 8 MG PO TBDP: 8.0000 mg | ORAL_TABLET | Freq: Three times a day (TID) | ORAL | 0 refills | Status: DC | PRN

## 2016-11-18 NOTE — ED Provider Notes (Signed)
[facility] DEPT Provider Note   CSN: [id number] Arrival date & time: 11/18/16  [place]     History   Chief Complaint Chief Complaint  Patient presents with  . flu like sypmtoms    HPI [name] is a 30 y.o. male.  HPI Patient presents emergency department with cough and congestion as well as chills and myalgias of the past several days.  7 mild decreased oral intake.  He reports he does have some nausea but denies vomiting.  Reports some diarrhea last week but this is since resolved.  Daughters in the emergency department with similar symptoms.  No other complaints at this time.  Symptoms are moderate in severity.  He states he is allergic to ibuprofen and Tylenol and therefore is not tried any medication prior to arrival.  He is wrapped up in his sweatshirt with his hood on in the bed.   Past Medical History:  Diagnosis Date  . ADHD (attention deficit hyperactivity disorder)   . Anxiety   . Bipolar disorder ([place])   . Chronic lower back pain   . Depression   . Esophagitis    Distal esophageal erosions consistent with mild erosive  reflux esophagitis 12/2008 by EGD   . Gastric ulcer   . GERD (gastroesophageal reflux disease)   . Hiatal hernia   . History of stomach ulcers   . Hx MRSA infection 8/08   right thigh  . Hypercholesteremia   . Obesity   . Occult GI bleeding 12/2008   Trivial upper GI bleed/uncontrolled GERD by upper endoscopy 12/2008, normal f/u endoscopy 03/2009 with SBCE at that time  . OSA on CPAP   . Pneumonia 09/01/2015   "on ATB still" (09/04/2015)  . S/P colonoscopy 11/10, 10/08   Dr [name]  . Schizophrenia [facility])   . Thyroid function test abnormal    Noted in 2011 discharge  . Tobacco dipper     Patient Active Problem List   Diagnosis Date Noted  . Wound drainage 09/04/2015  . Seasonal and perennial allergic rhinitis 07/23/2014  . Abdominal mass of other site 06/15/2012  . Abdominal cramps 06/15/2012  . Obstructive sleep apnea  05/03/2012  . Syncope and collapse 05/03/2012  . Chest pain 05/03/2012  . Obesity, morbid ([place]) 05/03/2012  . Anal fissure 05/27/2011  . UNSPECIFIED ANEMIA 09/11/2009  . Blood in stool 04/06/2009  . ABDOMINAL PAIN OTHER SPECIFIED SITE 03/07/2009  . BIPOLAR DISORDER UNSPECIFIED 03/06/2009  . SMOKELESS TOBACCO ABUSE 03/06/2009  . ATTENTION DEFICIT HYPERACTIVITY DISORDER 03/06/2009  . GERD 03/06/2009  . NAUSEA AND VOMITING 03/06/2009  . DIARRHEA 03/06/2009    Past Surgical History:  Procedure Laterality Date  . ANKLE FRACTURE SURGERY Left ~ 2008  . BACK SURGERY    . COLONOSCOPY  10/10/2009   anal papilla otherwise normal  . ESOPHAGOGASTRODUODENOSCOPY   04/07/2009   Normal esophagus, small hiatal hernia  . ESOPHAGOGASTRODUODENOSCOPY  01/09/2009   Distal esophageal erosions consistent with mild erosive reflux esophagitis, otherwise normal esophagus, small hiatal herniaotherwise normal stomach, D1-D2   . ESOPHAGOGASTRODUODENOSCOPY  08/26/2007   Normal esophagus, a small hiatal/hernia, otherwise normal stomach D1 through D3  . FRACTURE SURGERY    . ileocolonoscopy  08/26/2007    A normal rectum, colon, and terminal ileum  . INCISION AND DRAINAGE  09/04/2015   "reopened my back incsion"  . LUMBAR MICRODISCECTOMY  08/21/2015  . LUMBAR WOUND DEBRIDEMENT N/A 09/04/2015   Procedure: LUMBAR WOUND DEBRIDEMENT;  Surgeon: Consuella Lose, MD;  Location: [facility]  ORS;  Service: Neurosurgery;  Laterality: N/A;  . Small bowel capsule  04/11/2009    normal throughout  . VASECTOMY         Home Medications    Prior to Admission medications   Medication Sig Start Date End Date Taking? Authorizing Provider  cyclobenzaprine (FLEXERIL) 10 MG tablet Take 10 mg by mouth 3 (three) times daily as needed for muscle spasms.   Yes Historical Provider, MD  doxepin (SINEQUAN) 10 MG capsule Take 10 mg by mouth at bedtime.    Yes Historical Provider, MD  gabapentin (NEURONTIN) 300 MG capsule Take 300  mg by mouth 3 (three) times daily.   Yes Historical Provider, MD  lamoTRIgine (LAMICTAL) 100 MG tablet Take 1 tablet (100 mg total) by mouth daily. 09/24/16  Yes Claretta Fraise, MD  oxycodone (OXY-IR) 5 MG capsule Take 5 mg by mouth 3 (three) times daily.    Yes Historical Provider, MD  pantoprazole (PROTONIX) 40 MG tablet Take 1 tablet (40 mg total) by mouth 2 (two) times daily. 06/19/16 10/03/17 Yes Claretta Fraise, MD  traZODone (DESYREL) 50 MG tablet Take 50 mg by mouth at bedtime.   Yes Historical Provider, MD  EPINEPHrine 0.3 mg/0.3 mL IJ SOAJ injection Inject 0.3 mg into the muscle once.    Historical Provider, MD  ondansetron (ZOFRAN ODT) 8 MG disintegrating tablet Take 1 tablet (8 mg total) by mouth every 8 (eight) hours as needed for nausea or vomiting. 11/18/16   Jola Schmidt, MD    Family History Family History  Problem Relation Age of Onset  . Leukemia Father 69  . Seizures Mother   . Bipolar disorder Brother   . ADD / ADHD Brother   . Colon cancer Neg Hx     Social History Social History  Substance Use Topics  . Smoking status: Never Smoker  . Smokeless tobacco: Current User    Types: Snuff  . Alcohol use No     Allergies   Ibuprofen; Influenza vaccines; Tylenol [acetaminophen]; Bee venom; and Tramadol   Review of Systems Review of Systems  All other systems reviewed and are negative.    Physical Exam Updated Vital Signs BP 141/81 (BP Location: Left Arm)   Pulse 82   Temp 97.8 F (36.6 C) (Oral)   Resp 16   Ht 6' (1.829 m)   Wt (!) 360 lb (163.3 kg)   SpO2 99%   BMI 48.82 kg/m   Physical Exam  Constitutional: He is oriented to person, place, and time. He appears well-developed and well-nourished.  HENT:  Head: Normocephalic and atraumatic.  Eyes: EOM are normal.  Neck: Normal range of motion.  Cardiovascular: Normal rate, regular rhythm, normal heart sounds and intact distal pulses.   Pulmonary/Chest: Effort normal and breath sounds normal. No  respiratory distress.  Abdominal: Soft. He exhibits no distension. There is no tenderness.  Musculoskeletal: Normal range of motion.  Neurological: He is alert and oriented to person, place, and time.  Skin: Skin is warm and dry.  Psychiatric: He has a normal mood and affect. Judgment normal.  Nursing note and vitals reviewed.    ED Treatments / Results  Labs (all labs ordered are listed, but only abnormal results are displayed) Labs Reviewed - No data to display  EKG  EKG Interpretation None       Radiology No results found.  Procedures Procedures (including critical care time)  Medications Ordered in ED Medications  ondansetron (ZOFRAN-ODT) disintegrating tablet 8 mg (not administered)  Initial Impression / Assessment and Plan / ED Course  I have reviewed the triage vital signs and the nursing notes.  Pertinent labs & imaging results that were available during my care of the patient were reviewed by me and considered in my medical decision making (see chart for details).  Clinical Course     Viral syndrome.  No indication for imaging or labs.  Vital signs are stable.  Discharge home in good condition.  Nontender abdominal exam  Final Clinical Impressions(s) / ED Diagnoses   Final diagnoses:  Viral syndrome    New Prescriptions New Prescriptions   ONDANSETRON (ZOFRAN ODT) 8 MG DISINTEGRATING TABLET    Take 1 tablet (8 mg total) by mouth every 8 (eight) hours as needed for nausea or vomiting.     Jola Schmidt, MD 11/18/16 [phone]

## 2016-11-18 NOTE — ED Triage Notes (Signed)
Pt reports cough, chest pain, congestion, chills and body aches that started 4 days ago.

## 2016-11-26 ENCOUNTER — Encounter (HOSPITAL_BASED_OUTPATIENT_CLINIC_OR_DEPARTMENT_OTHER): Payer: Self-pay | Admitting: *Deleted

## 2016-11-26 ENCOUNTER — Emergency Department (HOSPITAL_BASED_OUTPATIENT_CLINIC_OR_DEPARTMENT_OTHER): Payer: Medicaid Other

## 2016-11-26 ENCOUNTER — Emergency Department (HOSPITAL_BASED_OUTPATIENT_CLINIC_OR_DEPARTMENT_OTHER)
Admission: EM | Admit: 2016-11-26 | Discharge: 2016-11-26 | Disposition: A | Discharge status: Home / Self Care | Payer: Medicaid Other | Attending: Physician Assistant | Admitting: Physician Assistant

## 2016-11-26 DIAGNOSIS — F909 Attention-deficit hyperactivity disorder, unspecified type: Secondary | ICD-10-CM | POA: Diagnosis not present

## 2016-11-26 DIAGNOSIS — J40 Bronchitis, not specified as acute or chronic: Secondary | ICD-10-CM

## 2016-11-26 DIAGNOSIS — F17290 Nicotine dependence, other tobacco product, uncomplicated: Secondary | ICD-10-CM | POA: Insufficient documentation

## 2016-11-26 DIAGNOSIS — Z79899 Other long term (current) drug therapy: Secondary | ICD-10-CM | POA: Insufficient documentation

## 2016-11-26 DIAGNOSIS — R05 Cough: Secondary | ICD-10-CM | POA: Diagnosis present

## 2016-11-26 MED ORDER — BENZONATATE 100 MG PO CAPS
100.0000 mg | ORAL_CAPSULE | Freq: Once | ORAL | Status: AC
  Administered 2016-11-26: 100 mg via ORAL
  Filled 2016-11-26: qty 1

## 2016-11-26 MED ORDER — PREDNISONE 20 MG PO TABS: 40.0000 mg | ORAL_TABLET | Freq: Every day | ORAL | 0 refills | Status: DC

## 2016-11-26 MED ORDER — IPRATROPIUM-ALBUTEROL 0.5-2.5 (3) MG/3ML IN SOLN
3.0000 mL | Freq: Once | RESPIRATORY_TRACT | Status: AC
  Administered 2016-11-26: 3 mL via RESPIRATORY_TRACT
  Filled 2016-11-26: qty 3

## 2016-11-26 MED ORDER — ALBUTEROL SULFATE HFA 108 (90 BASE) MCG/ACT IN AERS: 1.0000 | INHALATION_SPRAY | Freq: Four times a day (QID) | RESPIRATORY_TRACT | 0 refills | Status: DC | PRN

## 2016-11-26 MED ORDER — PREDNISONE 50 MG PO TABS
60.0000 mg | ORAL_TABLET | Freq: Once | ORAL | Status: AC
  Administered 2016-11-26: 60 mg via ORAL
  Filled 2016-11-26: qty 1

## 2016-11-26 MED ORDER — GUAIFENESIN-CODEINE 100-10 MG/5ML PO SOLN: 5.0000 mL | Freq: Three times a day (TID) | ORAL | 0 refills | Status: DC | PRN

## 2016-11-26 NOTE — Discharge Instructions (Signed)
Please take the cough medicine as prescribed. You may also use the albuterol inhaler has needed for shortness of breath or cough. Take the prednisone once a day for 3 days. Please follow up with your primary care doctor. Return to the DD if you develop exertional chest pain, worsening shortness or breath, worsening pain or for any other reason.

## 2016-11-26 NOTE — ED Provider Notes (Signed)
[facility] DEPT [facility] Provider Note   CSN: [id number] Arrival date & time: 11/26/16  [id number]  By signing my name below, I, Dora Sims, attest that this documentation has been prepared under the direction and in the presence of Ocie Cornfield, PA-C. Electronically Signed: Dora Sims, Scribe. 11/26/2016. 6:30 PM.  History   Chief Complaint Chief Complaint  Patient presents with  . Cough    The history is provided by the patient. No language interpreter was used.     HPI Comments: [name] is a 31 y.o. male who presents to the Emergency Department complaining of a persistent, worsening productive cough with green sputum for about 3 weeks. He reports associated chills, body aches, congestion, hoarse voice, wheezing, and some mild SOB. He reports chest pain secondary to his cough. Pt reports he went to an ED on 11/18/16 and was diagnosed with a viral syndrome; he notes he was not prescribed any medications. No OTC medications tried; he reports he is allergic to Tylenol and cannot take NSAIDs because of stomach ulcers. He states he has several sick contacts with similar symptoms. No h/o asthma or tobacco use. He denies nausea, vomiting, or any other associated symptoms.  Past Medical History:  Diagnosis Date  . ADHD (attention deficit hyperactivity disorder)   . Anxiety   . Bipolar disorder ([place])   . Chronic lower back pain   . Depression   . Esophagitis    Distal esophageal erosions consistent with mild erosive  reflux esophagitis 12/2008 by EGD   . Gastric ulcer   . GERD (gastroesophageal reflux disease)   . Hiatal hernia   . History of stomach ulcers   . Hx MRSA infection 8/08   right thigh  . Hypercholesteremia   . Obesity   . Occult GI bleeding 12/2008   Trivial upper GI bleed/uncontrolled GERD by upper endoscopy 12/2008, normal f/u endoscopy 03/2009 with SBCE at that time  . OSA on CPAP   . Pneumonia 09/01/2015   "on ATB still" (09/04/2015)  . S/P colonoscopy  11/10, 10/08   Dr [name]  . Schizophrenia [facility])   . Thyroid function test abnormal    Noted in 2011 discharge  . Tobacco dipper     Patient Active Problem List   Diagnosis Date Noted  . Wound drainage 09/04/2015  . Seasonal and perennial allergic rhinitis 07/23/2014  . Abdominal mass of other site 06/15/2012  . Abdominal cramps 06/15/2012  . Obstructive sleep apnea 05/03/2012  . Syncope and collapse 05/03/2012  . Chest pain 05/03/2012  . Obesity, morbid ([place]) 05/03/2012  . Anal fissure 05/27/2011  . UNSPECIFIED ANEMIA 09/11/2009  . Blood in stool 04/06/2009  . ABDOMINAL PAIN OTHER SPECIFIED SITE 03/07/2009  . BIPOLAR DISORDER UNSPECIFIED 03/06/2009  . SMOKELESS TOBACCO ABUSE 03/06/2009  . ATTENTION DEFICIT HYPERACTIVITY DISORDER 03/06/2009  . GERD 03/06/2009  . NAUSEA AND VOMITING 03/06/2009  . DIARRHEA 03/06/2009    Past Surgical History:  Procedure Laterality Date  . ANKLE FRACTURE SURGERY Left ~ 2008  . BACK SURGERY    . COLONOSCOPY  10/10/2009   anal papilla otherwise normal  . ESOPHAGOGASTRODUODENOSCOPY   04/07/2009   Normal esophagus, small hiatal hernia  . ESOPHAGOGASTRODUODENOSCOPY  01/09/2009   Distal esophageal erosions consistent with mild erosive reflux esophagitis, otherwise normal esophagus, small hiatal herniaotherwise normal stomach, D1-D2   . ESOPHAGOGASTRODUODENOSCOPY  08/26/2007   Normal esophagus, a small hiatal/hernia, otherwise normal stomach D1 through D3  . FRACTURE SURGERY    . ileocolonoscopy  08/26/2007  A normal rectum, colon, and terminal ileum  . INCISION AND DRAINAGE  09/04/2015   "reopened my back incsion"  . LUMBAR MICRODISCECTOMY  08/21/2015  . LUMBAR WOUND DEBRIDEMENT N/A 09/04/2015   Procedure: LUMBAR WOUND DEBRIDEMENT;  Surgeon: Consuella Lose, MD;  Location: [facility]URO ORS;  Service: Neurosurgery;  Laterality: N/A;  . Small bowel capsule  04/11/2009    normal throughout  . VASECTOMY         Home Medications     Prior to Admission medications   Medication Sig Start Date End Date Taking? Authorizing Provider  cyclobenzaprine (FLEXERIL) 10 MG tablet Take 10 mg by mouth 3 (three) times daily as needed for muscle spasms.    Historical Provider, MD  doxepin (SINEQUAN) 10 MG capsule Take 10 mg by mouth at bedtime.     Historical Provider, MD  EPINEPHrine 0.3 mg/0.3 mL IJ SOAJ injection Inject 0.3 mg into the muscle once.    Historical Provider, MD  gabapentin (NEURONTIN) 300 MG capsule Take 300 mg by mouth 3 (three) times daily.    Historical Provider, MD  lamoTRIgine (LAMICTAL) 100 MG tablet Take 1 tablet (100 mg total) by mouth daily. 09/24/16   Claretta Fraise, MD  ondansetron (ZOFRAN ODT) 8 MG disintegrating tablet Take 1 tablet (8 mg total) by mouth every 8 (eight) hours as needed for nausea or vomiting. 11/18/16   Jola Schmidt, MD  oxycodone (OXY-IR) 5 MG capsule Take 5 mg by mouth 3 (three) times daily.     Historical Provider, MD  pantoprazole (PROTONIX) 40 MG tablet Take 1 tablet (40 mg total) by mouth 2 (two) times daily. 06/19/16 10/03/17  Claretta Fraise, MD  traZODone (DESYREL) 50 MG tablet Take 50 mg by mouth at bedtime.    Historical Provider, MD    Family History Family History  Problem Relation Age of Onset  . Leukemia Father 42  . Seizures Mother   . Bipolar disorder Brother   . ADD / ADHD Brother   . Colon cancer Neg Hx     Social History Social History  Substance Use Topics  . Smoking status: Never Smoker  . Smokeless tobacco: Current User    Types: Snuff  . Alcohol use No     Allergies   Ibuprofen; Influenza vaccines; Tylenol [acetaminophen]; Bee venom; and Tramadol   Review of Systems Review of Systems  Constitutional: Positive for chills.  HENT: Positive for congestion and voice change (hoarse).   Respiratory: Positive for cough (productive), shortness of breath and wheezing.   Cardiovascular: Positive for chest pain (secondary to cough).  Gastrointestinal: Negative  for nausea and vomiting.  Musculoskeletal: Positive for myalgias (generalized).  All other systems reviewed and are negative.    Physical Exam Updated Vital Signs BP 143/97 (BP Location: Left Arm)   Pulse 105   Temp 97.8 F (36.6 C) (Oral)   Resp 20   Ht 6\' 3"  (1.905 m)   Wt (!) 360 lb (163.3 kg)   SpO2 100%   BMI 45.00 kg/m   Physical Exam  Constitutional: He is oriented to person, place, and time. He appears well-developed and well-nourished. No distress.  HENT:  Head: Normocephalic and atraumatic.  Right Ear: Tympanic membrane, external ear and ear canal normal.  Left Ear: Tympanic membrane, external ear and ear canal normal.  Nose: Mucosal edema and rhinorrhea present. Right sinus exhibits no maxillary sinus tenderness and no frontal sinus tenderness. Left sinus exhibits no maxillary sinus tenderness and no frontal sinus tenderness.  Mouth/Throat:  Uvula is midline and mucous membranes are normal. Posterior oropharyngeal erythema present. No oropharyngeal exudate, posterior oropharyngeal edema or tonsillar abscesses. Tonsils are 0 on the right. Tonsils are 0 on the left. No tonsillar exudate.  Eyes: Conjunctivae and EOM are normal.  Neck: Normal range of motion. Neck supple. No tracheal deviation present.  Cardiovascular: Normal rate, regular rhythm, normal heart sounds and intact distal pulses.   Slightly tachycardic in triage on my exam patient hr was 92 with normal rythm  Pulmonary/Chest: Effort normal. No respiratory distress. He has wheezes. He exhibits no tenderness.  Scattered bilaterally expiratory wheezes noted. Patient is not hypoxcic or tachycpniec. No respiratory distress.  Musculoskeletal: Normal range of motion.  Lymphadenopathy:    He has no cervical adenopathy.  Neurological: He is alert and oriented to person, place, and time.  Skin: Skin is warm and dry. Capillary refill takes less than 2 seconds.  Psychiatric: He has a normal mood and affect. His behavior  is normal.  Nursing note and vitals reviewed.    ED Treatments / Results  Labs (all labs ordered are listed, but only abnormal results are displayed) Labs Reviewed - No data to display  EKG  EKG Interpretation None       Radiology Dg Chest 2 View  Result Date: 11/26/2016 CLINICAL DATA:  Cough and chest congestion. EXAM: CHEST  2 VIEW COMPARISON:  06/20/2016 FINDINGS: The heart size and mediastinal contours are within normal limits. Both lungs are clear. The visualized skeletal structures are unremarkable. IMPRESSION: Normal chest. Electronically Signed   By: Lorriane Shire M.D.   On: 11/26/2016 15:19    Procedures Procedures (including critical care time)  DIAGNOSTIC STUDIES: Oxygen Saturation is 100% on RA, normal by my interpretation.    COORDINATION OF CARE: 6:36 PM Discussed treatment plan with pt at bedside and pt agreed to plan.  Medications Ordered in ED Medications  ipratropium-albuterol (DUONEB) 0.5-2.5 (3) MG/3ML nebulizer solution 3 mL (3 mLs Nebulization Given 11/26/16 1852)  benzonatate (TESSALON) capsule 100 mg (100 mg Oral Given 11/26/16 1852)  predniSONE (DELTASONE) tablet 60 mg (60 mg Oral Given 11/26/16 1852)     Initial Impression / Assessment and Plan / ED Course  I have reviewed the triage vital signs and the nursing notes.  Pertinent labs & imaging results that were available during my care of the patient were reviewed by me and considered in my medical decision making (see chart for details).  Clinical Course   Patient presents with multiple complaints including cough, sore throat, rhinorrhea, body aches. Chest xray without any infiltrate. Oropharnxy not concerning for PTA or deep neck infection. Lung exam reveals scattered wheezes. Patient feels significant improvement in his symptoms after DuoNeb. Patient was initially tachycardic in triage on my exam patient normal rate. The patient is not hypoxic or tachypneic. Low suspicion for PE. Patient is potent  negative. Patient complains of chest pain secondary to cough. Doubt ACS. On reassessment patient is eating graham crackers and drinking soda in room. States he feels a little better. This is likely a viral bronchitis. I have given him albuterol inhaler, steroids, and cough. Encouraged symptomatic treatment. Encourage patient to follow-up with his primary care doctor. Pt is hemodynamically stable, in NAD, & able to ambulate in the ED. Pain has been managed & has no complaints prior to dc. Pt is comfortable with above plan and is stable for discharge at this time. All questions were answered prior to disposition. Strict return precautions for f/u to the ED  were discussed.   Final Clinical Impressions(s) / ED Diagnoses   Final diagnoses:  Bronchitis    New Prescriptions New Prescriptions   ALBUTEROL (PROVENTIL HFA;VENTOLIN HFA) 108 (90 BASE) MCG/ACT INHALER    Inhale 1-2 puffs into the lungs every 6 (six) hours as needed for wheezing or shortness of breath.   GUAIFENESIN-CODEINE 100-10 MG/5ML SYRUP    Take 5 mLs by mouth 3 (three) times daily as needed for cough.   PREDNISONE (DELTASONE) 20 MG TABLET    Take 2 tablets (40 mg total) by mouth daily with breakfast.   I personally performed the services described in this documentation, which was scribed in my presence. The recorded information has been reviewed and is accurate.    Doristine Devoid, PA-C 11/26/16 [place], MD 12/05/16 1149

## 2016-12-05 ENCOUNTER — Encounter: Payer: Self-pay | Admitting: Family

## 2016-12-05 ENCOUNTER — Ambulatory Visit (INDEPENDENT_AMBULATORY_CARE_PROVIDER_SITE_OTHER): Payer: Medicaid Other | Admitting: Family

## 2016-12-05 VITALS — BP 126/72 | HR 83 | Temp 96.6°F | Ht 72.0 in | Wt 373.8 lb

## 2016-12-05 DIAGNOSIS — L500 Allergic urticaria: Secondary | ICD-10-CM

## 2016-12-05 MED ORDER — CETIRIZINE HCL 10 MG PO TABS: 10.0000 mg | ORAL_TABLET | Freq: Every day | ORAL | 11 refills | Status: DC

## 2016-12-05 NOTE — Progress Notes (Signed)
   Subjective:    Patient ID: [name], male    DOB: [birth year], 31 y.o.   MRN: [id number]   Pt presents to the office today with a new rash that started about 11/30/15 ago.Pt started on prednisone 80 mg for 4 days and guaifenesin-codeine  on 11/29/15. Rash  This is a new problem. The current episode started in the past 7 days. The affected locations include the back, abdomen, left arm, right arm, left upper leg and right upper leg. The rash is characterized by itchiness and redness. He was exposed to a new medication. Associated symptoms include coughing. Pertinent negatives include no diarrhea, eye pain, fever, joint pain, sore throat or vomiting. Past treatments include antihistamine. The treatment provided no relief.      Review of Systems  Constitutional: Negative for fever.  HENT: Negative for sore throat.   Eyes: Negative for pain.  Respiratory: Positive for cough.   Gastrointestinal: Negative for diarrhea and vomiting.  Musculoskeletal: Negative for joint pain.  Skin: Positive for rash.  All other systems reviewed and are negative.      Objective:   Physical Exam  Constitutional: He is oriented to person, place, and time. He appears well-developed and well-nourished. No distress.  HENT:  Head: Normocephalic.  Right Ear: External ear normal.  Left Ear: External ear normal.  Mouth/Throat: Oropharynx is clear and moist.  Eyes: Pupils are equal, round, and reactive to light. Right eye exhibits no discharge. Left eye exhibits no discharge.  Neck: Normal range of motion. Neck supple. No thyromegaly present.  Cardiovascular: Normal rate, regular rhythm, normal heart sounds and intact distal pulses.   No murmur heard. Pulmonary/Chest: Effort normal and breath sounds normal. No respiratory distress. He has no wheezes.  Abdominal: Soft. Bowel sounds are normal. He exhibits no distension. There is no tenderness.  Musculoskeletal: Normal range of motion. He exhibits no edema  or tenderness.  Neurological: He is alert and oriented to person, place, and time. He has normal reflexes. No cranial nerve deficit.  Skin: Skin is warm and dry. Rash noted. No erythema.  Erythemas wheals on abdomen, upper thighs, and bilateral lower arms  Psychiatric: He has a normal mood and affect. His behavior is normal. Judgment and thought content normal.  Vitals reviewed.     BP 126/72   Pulse 83   Temp (!) 96.6 F (35.9 C) (Oral)   Ht 6' (1.829 m)   Wt (!) 373 lb 12.8 oz (169.6 kg)   BMI 50.70 kg/m      Assessment & Plan:  1. Allergic urticaria -Do not scratch -Stop cough medication- guaifenesin-Codeine  -Start daily zyrtec, will hold off on steroids at this time since just completed dose -If does not improve pt needs to make appt with psychologists to see if related to medications -RTO prn or if worsens - cetirizine (ZYRTEC) 10 MG tablet; Take 1 tablet (10 mg total) by mouth daily.  Dispense: 30 tablet; Refill: [place], FNP

## 2016-12-05 NOTE — Patient Instructions (Signed)
Hives Introduction Hives (urticaria) are itchy, red, swollen areas on your skin. Hives can show up on any part of your body, and they can vary in size. They can be as small as the tip of a pen or much larger. Hives often fade within 24 hours (acute hives). In other cases, new hives show up after old ones fade. This can continue for many days or weeks (chronic hives). Hives are caused by your body's reaction to an irritant or to something that you are allergic to (trigger). You can get hives right after being around a trigger or hours later. Hives do not spread from person to person (are not contagious). Hives may get worse if you scratch them, if you exercise, or if you have worries (emotional stress). Follow these instructions at home: Medicines  Take or apply over-the-counter and prescription medicines only as told by your doctor.  If you were prescribed an antibiotic medicine, use it as told by your doctor. Do not stop taking the antibiotic even if you start to feel better. Skin Care  Apply cool, wet cloths (cool compresses) to the itchy, red, swollen areas.  Do not scratch your skin. Do not rub your skin. General instructions  Do not take hot showers or baths. This can make itching worse.  Do not wear tight clothes.  Use sunscreen and wear clothing that covers your skin when you are outside.  Avoid any triggers that cause your hives. Keep a journal to help you keep track of what causes your hives. Write down:  What medicines you take.  What you eat and drink.  What products you use on your skin.  Keep all follow-up visits as told by your doctor. This is important. Contact a doctor if:  Your symptoms are not better with medicine.  Your joints are painful or swollen. Get help right away if:  You have a fever.  You have belly pain.  Your tongue or lips are swollen.  Your eyelids are swollen.  Your chest or throat feels tight.  You have trouble breathing or  swallowing. These symptoms may be an emergency. Do not wait to see if the symptoms will go away. Get medical help right away. Call your local emergency services (911 in the [place] Do not drive yourself to the hospital.  This information is not intended to replace advice given to you by your health care provider. Make sure you discuss any questions you have with your health care provider. Document Released: 08/20/2008 Document Revised: 04/18/2016 Document Reviewed: 08/30/2015  2017 Elsevier

## 2017-03-14 ENCOUNTER — Ambulatory Visit: Payer: Medicaid Other | Admitting: Internal Medicine

## 2017-04-09 ENCOUNTER — Ambulatory Visit (INDEPENDENT_AMBULATORY_CARE_PROVIDER_SITE_OTHER): Payer: Medicaid Other | Admitting: Family Medicine

## 2017-04-09 ENCOUNTER — Encounter: Payer: Self-pay | Admitting: Family Medicine

## 2017-04-09 VITALS — BP 125/79 | HR 107 | Ht 68.0 in | Wt 387.0 lb

## 2017-04-09 DIAGNOSIS — Z024 Encounter for examination for driving license: Secondary | ICD-10-CM

## 2017-04-10 NOTE — Progress Notes (Signed)
Subjective:  Patient ID: [name], male    DOB: 02/20/1986  Age: 31 y.o. MRN: [id number]  CC: Private DOT physical   HPI [name] presents for DOT evaluation   History [name] has a past medical history of ADHD (attention deficit hyperactivity disorder); Anxiety; Bipolar disorder ([place]); Chronic lower back pain; Depression; Esophagitis; Gastric ulcer; GERD (gastroesophageal reflux disease); Hiatal hernia; History of stomach ulcers; MRSA infection (8/08); Hypercholesteremia; Obesity; Occult GI bleeding (12/2008); OSA on CPAP; Pneumonia (09/01/2015); S/P colonoscopy (11/10, 10/08); Schizophrenia ([place]); Thyroid function test abnormal; and Tobacco dipper.   He has a past surgical history that includes Vasectomy; Ankle fracture surgery (Left, ~ 2008); Small bowel capsule (04/11/2009); Esophagogastroduodenoscopy ( 04/07/2009); Esophagogastroduodenoscopy (01/09/2009); ileocolonoscopy (08/26/2007); Esophagogastroduodenoscopy (08/26/2007); Colonoscopy (10/10/2009); Back surgery; Lumbar microdiscectomy (08/21/2015); Incision and drainage (09/04/2015); Fracture surgery; and Lumbar wound debridement (N/A, 09/04/2015).   His family history includes ADD / ADHD in his brother; Bipolar disorder in his brother; Leukemia (age of onset: 45) in his father; Seizures in his mother.He reports that he has never smoked. His smokeless tobacco use includes Snuff. He reports that he does not drink alcohol or use drugs.    ROS Review of Systems  Constitutional: Negative for chills, diaphoresis, fever and unexpected weight change.  HENT: Negative for congestion, hearing loss, rhinorrhea and sore throat.   Eyes: Negative for visual disturbance.  Respiratory: Negative for cough and shortness of breath.   Cardiovascular: Negative for chest pain.  Gastrointestinal: Negative for abdominal pain, constipation and diarrhea.  Genitourinary: Negative for dysuria and flank pain.  Musculoskeletal: Negative for  arthralgias and joint swelling.  Skin: Negative for rash.  Neurological: Negative for dizziness and headaches.  Psychiatric/Behavioral: Negative for dysphoric mood and sleep disturbance.    Objective:  BP 125/79   Pulse (!) 107   Ht 5\' 8"  (1.727 m)   Wt (!) 387 lb (175.5 kg)   BMI 58.84 kg/m   BP Readings from Last 3 Encounters:  04/09/17 125/79  12/05/16 126/72  11/26/16 128/65    Wt Readings from Last 3 Encounters:  04/09/17 (!) 387 lb (175.5 kg)  12/05/16 (!) 373 lb 12.8 oz (169.6 kg)  11/26/16 (!) 360 lb (163.3 kg)     Physical Exam  Constitutional: He is oriented to person, place, and time. He appears well-developed and well-nourished. No distress.  HENT:  Head: Normocephalic and atraumatic.  Right Ear: External ear normal.  Left Ear: External ear normal.  Nose: Nose normal.  Mouth/Throat: Oropharynx is clear and moist.  Eyes: Conjunctivae and EOM are normal. Pupils are equal, round, and reactive to light.  Neck: Normal range of motion. Neck supple. No thyromegaly present.  Cardiovascular: Normal rate, regular rhythm and normal heart sounds.   No murmur heard. Pulmonary/Chest: Effort normal and breath sounds normal. No respiratory distress. He has no wheezes. He has no rales.  Abdominal: Soft. Bowel sounds are normal. He exhibits no distension. There is no tenderness.  Lymphadenopathy:    He has no cervical adenopathy.  Neurological: He is alert and oriented to person, place, and time. He has normal reflexes.  Skin: Skin is warm and dry.  Psychiatric: He has a normal mood and affect. His behavior is normal. Judgment and thought content normal.      Assessment & Plan:   There are no diagnoses linked to this encounter.     I have discontinued Mr. Flagg's ondansetron. I am also having him maintain his traZODone, EPINEPHrine, pantoprazole, gabapentin, doxepin, oxycodone, cyclobenzaprine,  lamoTRIgine, albuterol, and cetirizine.  Allergies as of 04/09/2017        Reactions   Ibuprofen Other (See Comments)   Makes ulcers bleed   Influenza Vaccines Shortness Of Breath   Rash and unable to breathe well   Tylenol [acetaminophen] Other (See Comments)   Bleeding ulcers   Bee Venom Swelling   Tramadol Itching      Medication List       Accurate as of 04/09/17 11:59 PM. Always use your most recent med list.          albuterol 108 (90 Base) MCG/ACT inhaler Commonly known as:  PROVENTIL HFA;VENTOLIN HFA Inhale 1-2 puffs into the lungs every 6 (six) hours as needed for wheezing or shortness of breath.   cetirizine 10 MG tablet Commonly known as:  ZYRTEC Take 1 tablet (10 mg total) by mouth daily.   cyclobenzaprine 10 MG tablet Commonly known as:  FLEXERIL Take 10 mg by mouth 3 (three) times daily as needed for muscle spasms.   doxepin 10 MG capsule Commonly known as:  SINEQUAN Take 10 mg by mouth at bedtime.   EPINEPHrine 0.3 mg/0.3 mL Soaj injection Commonly known as:  EPI-PEN Inject 0.3 mg into the muscle once.   gabapentin 300 MG capsule Commonly known as:  NEURONTIN Take 300 mg by mouth 3 (three) times daily.   lamoTRIgine 100 MG tablet Commonly known as:  LAMICTAL Take 1 tablet (100 mg total) by mouth daily.   oxycodone 5 MG capsule Commonly known as:  OXY-IR Take 5 mg by mouth 3 (three) times daily.   pantoprazole 40 MG tablet Commonly known as:  PROTONIX Take 1 tablet (40 mg total) by mouth 2 (two) times daily.   traZODone 50 MG tablet Commonly known as:  DESYREL Take 50 mg by mouth at bedtime.        Follow-up: No Follow-up on file.  Claretta Fraise, M.D.

## 2017-05-07 ENCOUNTER — Encounter (HOSPITAL_BASED_OUTPATIENT_CLINIC_OR_DEPARTMENT_OTHER): Payer: Self-pay | Admitting: *Deleted

## 2017-05-07 ENCOUNTER — Emergency Department (HOSPITAL_BASED_OUTPATIENT_CLINIC_OR_DEPARTMENT_OTHER): Payer: Medicaid Other

## 2017-05-07 ENCOUNTER — Emergency Department (HOSPITAL_BASED_OUTPATIENT_CLINIC_OR_DEPARTMENT_OTHER)
Admission: EM | Admit: 2017-05-07 | Discharge: 2017-05-08 | Disposition: A | Discharge status: Home / Self Care | Payer: Medicaid Other | Attending: Emergency Medicine | Admitting: Emergency Medicine

## 2017-05-07 DIAGNOSIS — N5089 Other specified disorders of the male genital organs: Secondary | ICD-10-CM | POA: Insufficient documentation

## 2017-05-07 DIAGNOSIS — R1030 Lower abdominal pain, unspecified: Secondary | ICD-10-CM

## 2017-05-07 DIAGNOSIS — G8929 Other chronic pain: Secondary | ICD-10-CM | POA: Insufficient documentation

## 2017-05-07 DIAGNOSIS — Z79899 Other long term (current) drug therapy: Secondary | ICD-10-CM | POA: Insufficient documentation

## 2017-05-07 DIAGNOSIS — R1909 Other intra-abdominal and pelvic swelling, mass and lump: Secondary | ICD-10-CM | POA: Diagnosis present

## 2017-05-07 LAB — URINALYSIS, ROUTINE W REFLEX MICROSCOPIC
BILIRUBIN URINE: NEGATIVE
GLUCOSE, UA: NEGATIVE mg/dL
Hgb urine dipstick: NEGATIVE
KETONES UR: NEGATIVE mg/dL
Leukocytes, UA: NEGATIVE
NITRITE: NEGATIVE
Protein, ur: NEGATIVE mg/dL
SPECIFIC GRAVITY, URINE: 1.009 (ref 1.005–1.030)
pH: 6 (ref 5.0–8.0)

## 2017-05-07 LAB — CBC WITH DIFFERENTIAL/PLATELET
BASOS ABS: 0 10*3/uL (ref 0.0–0.1)
Basophils Relative: 0 %
EOS PCT: 2 %
Eosinophils Absolute: 0.2 10*3/uL (ref 0.0–0.7)
HEMATOCRIT: 42.9 % (ref 39.0–52.0)
Hemoglobin: 14.8 g/dL (ref 13.0–17.0)
LYMPHS ABS: 3.6 10*3/uL (ref 0.7–4.0)
LYMPHS PCT: 38 %
MCH: 28.6 pg (ref 26.0–34.0)
MCHC: 34.5 g/dL (ref 30.0–36.0)
MCV: 82.8 fL (ref 78.0–100.0)
MONO ABS: 0.6 10*3/uL (ref 0.1–1.0)
MONOS PCT: 7 %
NEUTROS ABS: 5 10*3/uL (ref 1.7–7.7)
Neutrophils Relative %: 53 %
PLATELETS: 269 10*3/uL (ref 150–400)
RBC: 5.18 MIL/uL (ref 4.22–5.81)
RDW: 14.1 % (ref 11.5–15.5)
WBC: 9.5 10*3/uL (ref 4.0–10.5)

## 2017-05-07 LAB — BASIC METABOLIC PANEL
ANION GAP: 8 (ref 5–15)
BUN: 9 mg/dL (ref 6–20)
CO2: 29 mmol/L (ref 22–32)
Calcium: 9.2 mg/dL (ref 8.9–10.3)
Chloride: 99 mmol/L — ABNORMAL LOW (ref 101–111)
Creatinine, Ser: 1.04 mg/dL (ref 0.61–1.24)
GFR calc Af Amer: >60 mL/min (ref >60)
GLUCOSE: 103 mg/dL — AB (ref 65–99)
POTASSIUM: 4 mmol/L (ref 3.5–5.1)
Sodium: 136 mmol/L (ref 135–145)

## 2017-05-07 NOTE — Discharge Instructions (Signed)
Please follow up with your primary doctor Return for worsening symptoms

## 2017-05-07 NOTE — ED Provider Notes (Signed)
[facility] Provider Note   CSN: [id number] Arrival date & time: 05/07/17  2045  By signing my name below, I, Jaquelyn Bitter., attest that this documentation has been prepared under the direction and in the presence of Janetta Hora, PA-C. Electronically signed: Jaquelyn Bitter., ED Scribe. 05/07/17. 8:53 PM.   History   Chief Complaint Chief Complaint  Patient presents with  . Groin Swelling    HPI DAX MURGUIA is a 31 y.o. male with hx of chronic back pain who presents to the Emergency Department complaining of constant, worsening, moderate bilateral groin swelling and pain with onset x1 week.  For the past 13 hours, pt states that he has had increased difficulty urinating. He reports the pain radiating to the abdomen and to the back. Pt describes the pain as stabbing and shooting. Pt reports associated nausea and back pain. Pt denies any modifying factors and states that the pain is alleviated with lying supine. Pt denies dysuria, hematuria, vomiting, decreased appetite, penile discharge, penile pain. Pt denies concern for STDs, hx of kidney stones, hx of kidney complications. He was seen in the ED for a similar complaint in March 2017 and was diagnosed with Epididymitis. He was prescribed Levaquin at that time.  The history is provided by the patient. No language interpreter was used.    Past Medical History:  Diagnosis Date  . ADHD (attention deficit hyperactivity disorder)   . Anxiety   . Bipolar disorder ([place])   . Chronic lower back pain   . Depression   . Esophagitis    Distal esophageal erosions consistent with mild erosive  reflux esophagitis 12/2008 by EGD   . Gastric ulcer   . GERD (gastroesophageal reflux disease)   . Hiatal hernia   . History of stomach ulcers   . Hx MRSA infection 8/08   right thigh  . Hypercholesteremia   . Obesity   . Occult GI bleeding 12/2008   Trivial upper GI bleed/uncontrolled GERD by upper endoscopy  12/2008, normal f/u endoscopy 03/2009 with SBCE at that time  . OSA on CPAP   . Pneumonia 09/01/2015   "on ATB still" (09/04/2015)  . S/P colonoscopy 11/10, 10/08   Dr [name]  . Schizophrenia [facility])   . Thyroid function test abnormal    Noted in 2011 discharge  . Tobacco dipper     Patient Active Problem List   Diagnosis Date Noted  . Wound drainage 09/04/2015  . Seasonal and perennial allergic rhinitis 07/23/2014  . Abdominal mass of other site 06/15/2012  . Abdominal cramps 06/15/2012  . Obstructive sleep apnea 05/03/2012  . Syncope and collapse 05/03/2012  . Chest pain 05/03/2012  . Obesity, morbid ([place]) 05/03/2012  . Anal fissure 05/27/2011  . UNSPECIFIED ANEMIA 09/11/2009  . Blood in stool 04/06/2009  . ABDOMINAL PAIN OTHER SPECIFIED SITE 03/07/2009  . BIPOLAR DISORDER UNSPECIFIED 03/06/2009  . SMOKELESS TOBACCO ABUSE 03/06/2009  . ATTENTION DEFICIT HYPERACTIVITY DISORDER 03/06/2009  . GERD 03/06/2009  . NAUSEA AND VOMITING 03/06/2009  . DIARRHEA 03/06/2009    Past Surgical History:  Procedure Laterality Date  . ANKLE FRACTURE SURGERY Left ~ 2008  . BACK SURGERY    . COLONOSCOPY  10/10/2009   anal papilla otherwise normal  . ESOPHAGOGASTRODUODENOSCOPY   04/07/2009   Normal esophagus, small hiatal hernia  . ESOPHAGOGASTRODUODENOSCOPY  01/09/2009   Distal esophageal erosions consistent with mild erosive reflux esophagitis, otherwise normal esophagus, small hiatal herniaotherwise normal stomach, D1-D2   .  ESOPHAGOGASTRODUODENOSCOPY  08/26/2007   Normal esophagus, a small hiatal/hernia, otherwise normal stomach D1 through D3  . FRACTURE SURGERY    . ileocolonoscopy  08/26/2007    A normal rectum, colon, and terminal ileum  . INCISION AND DRAINAGE  09/04/2015   "reopened my back incsion"  . LUMBAR MICRODISCECTOMY  08/21/2015  . LUMBAR WOUND DEBRIDEMENT N/A 09/04/2015   Procedure: LUMBAR WOUND DEBRIDEMENT;  Surgeon: Consuella Lose, MD;  Location: [facility];   Service: Neurosurgery;  Laterality: N/A;  . Small bowel capsule  04/11/2009    normal throughout  . VASECTOMY         Home Medications    Prior to Admission medications   Medication Sig Start Date End Date Taking? Authorizing Provider  albuterol (PROVENTIL HFA;VENTOLIN HFA) 108 (90 Base) MCG/ACT inhaler Inhale 1-2 puffs into the lungs every 6 (six) hours as needed for wheezing or shortness of breath. 11/26/16   Doristine Devoid, PA-C  cetirizine (ZYRTEC) 10 MG tablet Take 1 tablet (10 mg total) by mouth daily. 12/05/16   Sharion Balloon, FNP  cyclobenzaprine (FLEXERIL) 10 MG tablet Take 10 mg by mouth 3 (three) times daily as needed for muscle spasms.    [provider]  doxepin (SINEQUAN) 10 MG capsule Take 10 mg by mouth at bedtime.     [provider]  EPINEPHrine 0.3 mg/0.3 mL IJ SOAJ injection Inject 0.3 mg into the muscle once.    [provider]  gabapentin (NEURONTIN) 300 MG capsule Take 300 mg by mouth 3 (three) times daily.    [provider]  lamoTRIgine (LAMICTAL) 100 MG tablet Take 1 tablet (100 mg total) by mouth daily. 09/24/16   Claretta Fraise, MD  oxycodone (OXY-IR) 5 MG capsule Take 5 mg by mouth 3 (three) times daily.     [provider]  pantoprazole (PROTONIX) 40 MG tablet Take 1 tablet (40 mg total) by mouth 2 (two) times daily. 06/19/16 10/03/17  Claretta Fraise, MD  traZODone (DESYREL) 50 MG tablet Take 50 mg by mouth at bedtime.    [provider]    Family History Family History  Problem Relation Age of Onset  . Leukemia Father 21  . Seizures Mother   . Bipolar disorder Brother   . ADD / ADHD Brother   . Colon cancer Neg Hx     Social History Social History  Substance Use Topics  . Smoking status: Never Smoker  . Smokeless tobacco: Current User    Types: Snuff  . Alcohol use No     Allergies   Ibuprofen; Influenza vaccines; Tylenol [acetaminophen]; Bee venom; and Tramadol   Review of  Systems Review of Systems  Constitutional: Negative for fever.  Respiratory: Negative for shortness of breath.   Cardiovascular: Negative for chest pain.  Gastrointestinal: Positive for abdominal pain and nausea. Negative for vomiting.  Genitourinary: Positive for difficulty urinating, flank pain (R), scrotal swelling and testicular pain. Negative for discharge, dysuria, hematuria, penile pain and penile swelling.  Musculoskeletal: Positive for back pain.  All other systems reviewed and are negative.    Physical Exam Updated Vital Signs BP 129/83   Pulse (!) 104   Temp 98 F (36.7 C)   Resp 16   Ht 6' (1.829 m)   Wt (!) 360 lb (163.3 kg)   SpO2 100%   BMI 48.82 kg/m   Physical Exam  Constitutional: He is oriented to person, place, and time. He appears well-developed and well-nourished. No distress.  Morbidly obese.  HENT:  Head: Normocephalic and atraumatic.  Eyes: Conjunctivae are normal. Pupils are equal, round, and reactive to light. Right eye exhibits no discharge. Left eye exhibits no discharge. No scleral icterus.  Neck: Normal range of motion. Neck supple.  Cardiovascular: Normal rate and regular rhythm.   No murmur heard. Pulmonary/Chest: Effort normal and breath sounds normal. No respiratory distress.  Abdominal: Soft. He exhibits no distension. There is tenderness in the right lower quadrant and suprapubic area.  Suprapubic, RLQ and R flank tenderness.  Genitourinary: Right testis shows tenderness. Left testis shows tenderness. No discharge found.  Genitourinary Comments: Tenderness to the testicles bilaterally with no obvious discharge.   Musculoskeletal: He exhibits no edema.       Lumbar back: He exhibits tenderness.  Diffuse lower back tenderness.  Neurological: He is alert and oriented to person, place, and time.  Skin: Skin is warm and dry.  Psychiatric: He has a normal mood and affect. His behavior is normal.  Nursing note and vitals reviewed.    ED  Treatments / Results   DIAGNOSTIC STUDIES: Oxygen Saturation is 100% on RA, normal by my interpretation.   COORDINATION OF CARE: 8:54 PM-Discussed next steps with pt. Pt verbalized understanding and is agreeable with the plan.    Labs (all labs ordered are listed, but only abnormal results are displayed) Labs Reviewed  BASIC METABOLIC PANEL - Abnormal; Notable for the following:       Result Value   Chloride 99 (*)    Glucose, Bld 103 (*)    All other components within normal limits  URINALYSIS, ROUTINE W REFLEX MICROSCOPIC  CBC WITH DIFFERENTIAL/PLATELET  GC/CHLAMYDIA PROBE AMP ([place]) NOT AT [facility]    EKG  EKG Interpretation None       Radiology US Scrotum  Result Date: 05/07/2017 CLINICAL DATA:  Testicular pain and swelling for 1 week.  Dysuria. EXAM: ULTRASOUND OF SCROTUM TECHNIQUE: Complete ultrasound examination of the testicles, epididymis, and other scrotal structures was performed. COMPARISON:  02/21/2016 FINDINGS: Right testicle Measurements: 5.2 by 2.5 by 3.0 cm (volume = 20 cm^3). No mass or microlithiasis visualized. Left testicle Measurements: 4.6 by 2.8 by 3.2 cm (volume = 22 cm^3). No mass or microlithiasis visualized. Appendix testis noted, without classic findings of torsion. Right epididymis: Normal in size and appearance aside from a 3 mm epididymal cyst or spermatocele. Left epididymis:  Normal in size and appearance. Hydrocele:  Small bilateral hydroceles. Varicocele:  None visualized. IMPRESSION: 1. No current findings of epididymitis, orchitis, or testicular torsion. 2. Small bilateral hydroceles. 3. 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 Electronically Signed   By: Van Clines M.D.   On: 05/07/2017 23:10   [place] Art/ven Flow Abd Pelv Doppler  Result Date: 05/07/2017 CLINICAL DATA:  Testicular pain and swelling for 1 week.  Dysuria.  EXAM: ULTRASOUND OF SCROTUM TECHNIQUE: Complete ultrasound examination of the testicles, epididymis, and other scrotal structures was performed. COMPARISON:  02/21/2016 FINDINGS: Right testicle Measurements: 5.2 by 2.5 by 3.0 cm (volume = 20 cm^3). No mass or microlithiasis visualized. Left testicle Measurements: 4.6 by 2.8 by 3.2 cm (volume = 22 cm^3). No mass or microlithiasis visualized. Appendix testis noted, without classic findings of torsion. Right epididymis: Normal in size and appearance aside from a 3 mm epididymal cyst or spermatocele. Left epididymis:  Normal in size and appearance. Hydrocele:  Small bilateral hydroceles. Varicocele:  None visualized. IMPRESSION: 1. No current findings of epididymitis, orchitis, or testicular torsion. 2. Small bilateral hydroceles. 3. 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 Electronically Signed   By: Van Clines M.D.   On: 05/07/2017 23:10    Procedures Procedures (including critical care time)  Medications Ordered in ED Medications - No data to display   Initial Impression / Assessment and Plan / ED Course  I have reviewed the triage vital signs and the nursing notes.  Pertinent labs & imaging results that were available during my care of the patient were reviewed by me and considered in my medical decision making (see chart for details).  31 year old male presents with bilateral testicular swelling and lower abdominal and flank pain. Unclear etiology. Possibly a component of acute on chronic pain. He initially states he was unable to urinate and bladder scan revealed ~300cc of urine. A foley was going to be inserted however he did not want this so tried to pee and was able to urinate 400cc. CBC is unremarkable. BMP is unremarkable. UA clean. G&C sent. Testicular U/S is remarkable for small hydroceles which may explain his swelling. I  advised him to follow up with his PCP. He verbalized understanding.   Final Clinical Impressions(s) / ED Diagnoses   Final diagnoses:  Testicular swelling  Lower abdominal pain    New Prescriptions New Prescriptions   No medications on file   I personally performed the services described in this documentation, which was scribed in my presence. The recorded information has been reviewed and is accurate.     Recardo Evangelist, PA-C 05/08/17 [id number]    Malvin Johns, MD 05/11/17 0700

## 2017-05-07 NOTE — ED Triage Notes (Signed)
C/o bil groin pain and swelling x 1 week, and unable to urinate. Last void 13hrs ago

## 2017-05-07 NOTE — ED Notes (Signed)
ED Provider at bedside. 

## 2017-05-07 NOTE — ED Notes (Signed)
Patient transported to Ultrasound 

## 2017-05-16 ENCOUNTER — Ambulatory Visit: Payer: Medicaid Other | Admitting: Family Medicine

## 2017-06-07 ENCOUNTER — Emergency Department (HOSPITAL_BASED_OUTPATIENT_CLINIC_OR_DEPARTMENT_OTHER)
Admission: EM | Admit: 2017-06-07 | Discharge: 2017-06-07 | Disposition: A | Discharge status: Home / Self Care | Payer: Medicaid Other | Attending: Emergency Medicine | Admitting: Emergency Medicine

## 2017-06-07 ENCOUNTER — Encounter (HOSPITAL_BASED_OUTPATIENT_CLINIC_OR_DEPARTMENT_OTHER): Payer: Self-pay | Admitting: *Deleted

## 2017-06-07 DIAGNOSIS — F17290 Nicotine dependence, other tobacco product, uncomplicated: Secondary | ICD-10-CM | POA: Diagnosis not present

## 2017-06-07 DIAGNOSIS — Z79899 Other long term (current) drug therapy: Secondary | ICD-10-CM | POA: Diagnosis not present

## 2017-06-07 DIAGNOSIS — R21 Rash and other nonspecific skin eruption: Secondary | ICD-10-CM | POA: Diagnosis present

## 2017-06-07 MED ORDER — HYDROXYZINE HCL 25 MG PO TABS
25.0000 mg | ORAL_TABLET | Freq: Once | ORAL | Status: AC
  Administered 2017-06-07: 25 mg via ORAL
  Filled 2017-06-07: qty 1

## 2017-06-07 MED ORDER — HYDROXYZINE HCL 25 MG PO TABS: 25.0000 mg | ORAL_TABLET | Freq: Four times a day (QID) | ORAL | 0 refills | Status: DC | PRN

## 2017-06-07 NOTE — ED Triage Notes (Signed)
C/o rash and itching over body onset around 2200 last pm  Feels like something is crawling over him

## 2017-06-07 NOTE — ED Provider Notes (Signed)
[facility] DEPT [facility] Provider Note: Georgena Spurling, MD, FACEP  CSN: [id number] MRN: [id number] ARRIVAL: 06/07/17 at [place]: [place]  [name] is a 31 y.o. 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 He is here with a pruritic generalized maculopapular rash that began yesterday evening. Symptoms are moderate to severe. He describes sensation as that of bugs crawling on his skin but he has seen no actual insects. He is taken bath without relief. He is on Zyrtec but this has not helped. He denies systemic symptoms such as fever, chills, nausea, vomiting, diarrhea, shortness of breath or throat closing.   Past Medical History:  Diagnosis Date  . ADHD (attention deficit hyperactivity disorder)   . Anxiety   . Bipolar disorder ([place])   . Chronic lower back pain   . Depression   . Esophagitis    Distal esophageal erosions consistent with mild erosive  reflux esophagitis 12/2008 by EGD   . Gastric ulcer   . GERD (gastroesophageal reflux disease)   . Hiatal hernia   . History of stomach ulcers   . Hx MRSA infection 8/08   right thigh  . Hypercholesteremia   . Obesity   . Occult GI bleeding 12/2008   Trivial upper GI bleed/uncontrolled GERD by upper endoscopy 12/2008, normal f/u endoscopy 03/2009 with SBCE at that time  . OSA on CPAP   . Pneumonia 09/01/2015   "on ATB still" (09/04/2015)  . S/P colonoscopy 11/10, 10/08   Dr [name]  . Schizophrenia [facility])   . Thyroid function test abnormal    Noted in 2011 discharge  . Tobacco dipper     Past Surgical History:  Procedure Laterality Date  . ANKLE FRACTURE SURGERY Left ~ 2008  . BACK SURGERY    . COLONOSCOPY  10/10/2009   anal papilla otherwise normal  . ESOPHAGOGASTRODUODENOSCOPY   04/07/2009   Normal esophagus, small hiatal hernia  . ESOPHAGOGASTRODUODENOSCOPY  01/09/2009   Distal esophageal erosions  consistent with mild erosive reflux esophagitis, otherwise normal esophagus, small hiatal herniaotherwise normal stomach, D1-D2   . ESOPHAGOGASTRODUODENOSCOPY  08/26/2007   Normal esophagus, a small hiatal/hernia, otherwise normal stomach D1 through D3  . FRACTURE SURGERY    . ileocolonoscopy  08/26/2007    A normal rectum, colon, and terminal ileum  . INCISION AND DRAINAGE  09/04/2015   "reopened my back incsion"  . LUMBAR MICRODISCECTOMY  08/21/2015  . LUMBAR WOUND DEBRIDEMENT N/A 09/04/2015   Procedure: LUMBAR WOUND DEBRIDEMENT;  Surgeon: Consuella Lose, MD;  Location: [place] [facility]UR[facility] ORS;  Service: Neurosurgery;  Laterality: N/A;  . Small bowel capsule  04/11/2009    normal throughout  . VASECTOMY      Family History  Problem Relation Age of Onset  . Leukemia Father 38  . Seizures Mother   . Bipolar disorder Brother   . ADD / ADHD Brother   . Colon cancer Neg Hx     Social History  Substance Use Topics  . Smoking status: Never Smoker  . Smokeless tobacco: Current User    Types: Snuff  . Alcohol use No    Prior to Admission medications   Medication Sig Start Date End Date Taking? Authorizing Provider  albuterol (PROVENTIL HFA;VENTOLIN HFA) 108 (90 Base) MCG/ACT inhaler Inhale 1-2 puffs into the lungs every 6 (six) hours as needed for wheezing or shortness of breath. 11/26/16  Doristine Devoid, PA-C  cetirizine (ZYRTEC) 10 MG tablet Take 1 tablet (10 mg total) by mouth daily. 12/05/16   Sharion Balloon, FNP  cyclobenzaprine (FLEXERIL) 10 MG tablet Take 10 mg by mouth 3 (three) times daily as needed for muscle spasms.    [provider]  doxepin (SINEQUAN) 10 MG capsule Take 10 mg by mouth at bedtime.     [provider]  EPINEPHrine 0.3 mg/0.3 mL IJ SOAJ injection Inject 0.3 mg into the muscle once.    [provider]  gabapentin (NEURONTIN) 300 MG capsule Take 300 mg by mouth 3 (three) times daily.    [provider]  lamoTRIgine (LAMICTAL)  100 MG tablet Take 1 tablet (100 mg total) by mouth daily. 09/24/16   Claretta Fraise, MD  oxycodone (OXY-IR) 5 MG capsule Take 5 mg by mouth 3 (three) times daily.     [provider]  pantoprazole (PROTONIX) 40 MG tablet Take 1 tablet (40 mg total) by mouth 2 (two) times daily. 06/19/16 10/03/17  Claretta Fraise, MD  traZODone (DESYREL) 50 MG tablet Take 50 mg by mouth at bedtime.    [provider]    Allergies Ibuprofen; Influenza vaccines; Tylenol [acetaminophen]; Bee venom; and Tramadol   REVIEW OF SYSTEMS  Negative except as noted here or in the History of Present Illness.   PHYSICAL EXAMINATION  Initial Vital Signs Blood pressure 131/83, pulse 85, temperature 97.7 F (36.5 C), temperature source Oral, resp. rate 18, height 5\' 11"  (1.803 m), weight (!) 163.3 kg (360 lb), SpO2 98 %.  Examination General: Well-developed, well-nourished male in no acute distress; appearance consistent with age of record HENT: normocephalic; atraumatic Eyes: pupils equal, round and reactive to light; extraocular muscles intact Neck: supple Heart: regular rate and rhythm Lungs: clear to auscultation bilaterally Abdomen: soft; nondistended Extremities: No deformity; full range of motion Neurologic: Awake, alert and oriented; motor function intact in all extremities and symmetric; no facial droop Skin: Warm and dry; generalized maculopapular rash with evidence of scratching; no insects seen Psychiatric: Normal mood and affect   RESULTS  Summary of this visit's results, reviewed by myself:   EKG Interpretation  Date/Time:    Ventricular Rate:    PR Interval:    QRS Duration:   QT Interval:    QTC Calculation:   R Axis:     Text Interpretation:        Laboratory Studies: No results found for this or any previous visit (from the past 24 hour(s)). Imaging Studies: No results found.  ED COURSE  Nursing notes and initial vitals signs, including pulse oximetry,  reviewed.  Vitals:   06/07/17 0449  BP: 131/83  Pulse: 85  Resp: 18  Temp: 97.7 F (36.5 C)  TempSrc: Oral  SpO2: 98%  Weight: (!) 163.3 kg (360 lb)  Height: 5\' 11"  (1.803 m)    PROCEDURES    ED DIAGNOSES     ICD-10-CM   1. Rash of body R21        Raniyah Curenton, Jenny Reichmann, MD 06/07/17 [phone]

## 2017-07-04 ENCOUNTER — Emergency Department (HOSPITAL_COMMUNITY)
Admission: EM | Admit: 2017-07-04 | Discharge: 2017-07-04 | Disposition: A | Discharge status: Home / Self Care | Payer: Medicaid Other | Attending: Emergency Medicine | Admitting: Emergency Medicine

## 2017-07-04 ENCOUNTER — Encounter (HOSPITAL_COMMUNITY): Payer: Self-pay | Admitting: Emergency Medicine

## 2017-07-04 DIAGNOSIS — R21 Rash and other nonspecific skin eruption: Secondary | ICD-10-CM | POA: Diagnosis present

## 2017-07-04 DIAGNOSIS — F17220 Nicotine dependence, chewing tobacco, uncomplicated: Secondary | ICD-10-CM | POA: Diagnosis not present

## 2017-07-04 DIAGNOSIS — W57XXXA Bitten or stung by nonvenomous insect and other nonvenomous arthropods, initial encounter: Secondary | ICD-10-CM | POA: Insufficient documentation

## 2017-07-04 DIAGNOSIS — Z79899 Other long term (current) drug therapy: Secondary | ICD-10-CM | POA: Diagnosis not present

## 2017-07-04 DIAGNOSIS — L299 Pruritus, unspecified: Secondary | ICD-10-CM | POA: Insufficient documentation

## 2017-07-04 DIAGNOSIS — T7840XA Allergy, unspecified, initial encounter: Secondary | ICD-10-CM | POA: Diagnosis not present

## 2017-07-04 MED ORDER — SODIUM CHLORIDE 0.9 % IV BOLUS (SEPSIS)
1000.0000 mL | Freq: Once | INTRAVENOUS | Status: AC
  Administered 2017-07-04: 1000 mL via INTRAVENOUS

## 2017-07-04 MED ORDER — HYDROCODONE-ACETAMINOPHEN 5-325 MG PO TABS
1.0000 | ORAL_TABLET | Freq: Once | ORAL | Status: DC
  Filled 2017-07-04: qty 1

## 2017-07-04 MED ORDER — HYDROMORPHONE HCL 1 MG/ML IJ SOLN
1.0000 mg | Freq: Once | INTRAMUSCULAR | Status: AC
  Administered 2017-07-04: 1 mg via INTRAVENOUS
  Filled 2017-07-04: qty 1

## 2017-07-04 MED ORDER — METHYLPREDNISOLONE SODIUM SUCC 125 MG IJ SOLR
125.0000 mg | Freq: Once | INTRAMUSCULAR | Status: AC
  Administered 2017-07-04: 125 mg via INTRAVENOUS
  Filled 2017-07-04: qty 2

## 2017-07-04 MED ORDER — DIPHENHYDRAMINE HCL 50 MG/ML IJ SOLN
25.0000 mg | Freq: Once | INTRAMUSCULAR | Status: AC
  Administered 2017-07-04: 25 mg via INTRAVENOUS
  Filled 2017-07-04: qty 1

## 2017-07-04 NOTE — ED Notes (Signed)
PT discharged from ED in stable condition, with family.  AVS reviewed with verbalized understandings given from pt and family.  20G PIV removed from RAC with hemostasis achieved.  Pt left ambulatory.

## 2017-07-04 NOTE — Discharge Instructions (Signed)
Take Benadryl 25 mg 4 times a day as needed for swelling and itching. Return if problems

## 2017-07-04 NOTE — ED Triage Notes (Signed)
Pt states he was stung by bees x 1.5 hours ago and took benadryl for the same. Pt states epi pen was out of date.

## 2017-07-04 NOTE — ED Triage Notes (Signed)
Pt reports being stung 6 x by unknown bee but believes was yellow jackets 1 1./2 hour ago  Has taken benadryl

## 2017-07-07 NOTE — ED Provider Notes (Addendum)
[facility] DEPT Provider Note   CSN: [id number] Arrival date & time: 07/04/17  2109     History   Chief Complaint Chief Complaint  Patient presents with  . Allergic Reaction    HPI [name] is a 31 y.o. male.  Patient was stung by numerous Bees today and has swelling to his right arm   The history is provided by the patient.  Allergic Reaction  Presenting symptoms: itching and rash   Presenting symptoms: no difficulty breathing   Severity:  Moderate Prior allergic episodes:  Insect allergies Context: not animal exposure   Relieved by:  Nothing Worsened by:  Nothing   Past Medical History:  Diagnosis Date  . ADHD (attention deficit hyperactivity disorder)   . Anxiety   . Bipolar disorder ([place])   . Chronic lower back pain   . Depression   . Esophagitis    Distal esophageal erosions consistent with mild erosive  reflux esophagitis 12/2008 by EGD   . Gastric ulcer   . GERD (gastroesophageal reflux disease)   . Hiatal hernia   . History of stomach ulcers   . Hx MRSA infection 8/08   right thigh  . Hypercholesteremia   . Obesity   . Occult GI bleeding 12/2008   Trivial upper GI bleed/uncontrolled GERD by upper endoscopy 12/2008, normal f/u endoscopy 03/2009 with SBCE at that time  . OSA on CPAP   . Pneumonia 09/01/2015   "on ATB still" (09/04/2015)  . S/P colonoscopy 11/10, 10/08   Dr [name]  . Schizophrenia [facility])   . Thyroid function test abnormal    Noted in 2011 discharge  . Tobacco dipper     Patient Active Problem List   Diagnosis Date Noted  . Wound drainage 09/04/2015  . Seasonal and perennial allergic rhinitis 07/23/2014  . Abdominal mass of other site 06/15/2012  . Abdominal cramps 06/15/2012  . Obstructive sleep apnea 05/03/2012  . Syncope and collapse 05/03/2012  . Chest pain 05/03/2012  . Obesity, morbid ([place]) 05/03/2012  . Anal fissure 05/27/2011  . UNSPECIFIED ANEMIA 09/11/2009  . Blood in stool 04/06/2009  . ABDOMINAL  PAIN OTHER SPECIFIED SITE 03/07/2009  . BIPOLAR DISORDER UNSPECIFIED 03/06/2009  . SMOKELESS TOBACCO ABUSE 03/06/2009  . ATTENTION DEFICIT HYPERACTIVITY DISORDER 03/06/2009  . GERD 03/06/2009  . NAUSEA AND VOMITING 03/06/2009  . DIARRHEA 03/06/2009    Past Surgical History:  Procedure Laterality Date  . ANKLE FRACTURE SURGERY Left ~ 2008  . BACK SURGERY    . COLONOSCOPY  10/10/2009   anal papilla otherwise normal  . ESOPHAGOGASTRODUODENOSCOPY   04/07/2009   Normal esophagus, small hiatal hernia  . ESOPHAGOGASTRODUODENOSCOPY  01/09/2009   Distal esophageal erosions consistent with mild erosive reflux esophagitis, otherwise normal esophagus, small hiatal herniaotherwise normal stomach, D1-D2   . ESOPHAGOGASTRODUODENOSCOPY  08/26/2007   Normal esophagus, a small hiatal/hernia, otherwise normal stomach D1 through D3  . FRACTURE SURGERY    . ileocolonoscopy  08/26/2007    A normal rectum, colon, and terminal ileum  . INCISION AND DRAINAGE  09/04/2015   "reopened my back incsion"  . LUMBAR MICRODISCECTOMY  08/21/2015  . LUMBAR WOUND DEBRIDEMENT N/A 09/04/2015   Procedure: LUMBAR WOUND DEBRIDEMENT;  Surgeon: Consuella Lose, MD;  Location: [place] NEURO ORS;  Service: Neurosurgery;  Laterality: N/A;  . Small bowel capsule  04/11/2009    normal throughout  . VASECTOMY         Home Medications    Prior to Admission medications   Medication  Sig Start Date End Date Taking? Authorizing Provider  albuterol (PROVENTIL HFA;VENTOLIN HFA) 108 (90 Base) MCG/ACT inhaler Inhale 1-2 puffs into the lungs every 6 (six) hours as needed for wheezing or shortness of breath. 11/26/16  Yes Leaphart, Zack Seal, PA-C  cyclobenzaprine (FLEXERIL) 10 MG tablet Take 10 mg by mouth 3 (three) times daily as needed for muscle spasms.   Yes [provider]  diphenhydrAMINE (BENADRYL) 25 mg capsule Take 50 mg by mouth once as needed for allergies.   Yes [provider]  doxepin (SINEQUAN) 10 MG  capsule Take 10 mg by mouth at bedtime.    Yes [provider]  EPINEPHrine 0.3 mg/0.3 mL IJ SOAJ injection Inject 0.3 mg into the muscle once.   Yes [provider]  gabapentin (NEURONTIN) 300 MG capsule Take 300 mg by mouth 3 (three) times daily.   Yes [provider]  hydrOXYzine (ATARAX/VISTARIL) 25 MG tablet Take 1-2 tablets (25-50 mg total) by mouth every 6 (six) hours as needed for itching (may cause drowsiness; do not take Zyrtec with this). 06/07/17  Yes Molpus, John, MD  lamoTRIgine (LAMICTAL) 100 MG tablet Take 1 tablet (100 mg total) by mouth daily. 09/24/16  Yes Stacks, Cletus Gash, MD  oxycodone (OXY-IR) 5 MG capsule Take 5 mg by mouth 3 (three) times daily.    Yes [provider]  pantoprazole (PROTONIX) 40 MG tablet Take 1 tablet (40 mg total) by mouth 2 (two) times daily. 06/19/16 10/03/17 Yes StacksCletus Gash, MD  traZODone (DESYREL) 50 MG tablet Take 50 mg by mouth at bedtime.   Yes [provider]    Family History Family History  Problem Relation Age of Onset  . Leukemia Father 70  . Seizures Mother   . Bipolar disorder Brother   . ADD / ADHD Brother   . Colon cancer Neg Hx     Social History Social History  Substance Use Topics  . Smoking status: Never Smoker  . Smokeless tobacco: Current User    Types: Snuff  . Alcohol use No     Allergies   Ibuprofen; Influenza vaccines; Tylenol [acetaminophen]; Bee venom; and Tramadol   Review of Systems Review of Systems  Constitutional: Negative for appetite change and fatigue.  HENT: Negative for congestion, ear discharge and sinus pressure.   Eyes: Negative for discharge.  Respiratory: Negative for cough.   Cardiovascular: Negative for chest pain.  Gastrointestinal: Negative for abdominal pain and diarrhea.  Genitourinary: Negative for frequency and hematuria.  Musculoskeletal: Negative for back pain.       Swelling and rash to right forearm  Skin: Positive for itching and  rash.  Neurological: Negative for seizures and headaches.  Psychiatric/Behavioral: Negative for hallucinations.     Physical Exam Updated Vital Signs BP 120/75 (BP Location: Right Arm)   Pulse 83   Temp 97.9 F (36.6 C) (Oral)   Resp 16   Ht 6' (1.829 m)   Wt (!) 163.3 kg (360 lb)   SpO2 98%   BMI 48.82 kg/m   Physical Exam  Constitutional: He is oriented to person, place, and time. He appears well-developed.  HENT:  Head: Normocephalic.  Eyes: Conjunctivae and EOM are normal. No scleral icterus.  Neck: Neck supple. No thyromegaly present.  Cardiovascular: Normal rate and regular rhythm.  Exam reveals no gallop and no friction rub.   No murmur heard. Pulmonary/Chest: No stridor. He has no wheezes. He has no rales. He exhibits no tenderness.  Abdominal: He exhibits no  distension. There is no tenderness. There is no rebound.  Musculoskeletal:  Mild swelling to right upper arm with redness from insect sting  Lymphadenopathy:    He has no cervical adenopathy.  Neurological: He is oriented to person, place, and time. He exhibits normal muscle tone. Coordination normal.  Skin: No rash noted. No erythema.  Psychiatric: He has a normal mood and affect. His behavior is normal.     ED Treatments / Results  Labs (all labs ordered are listed, but only abnormal results are displayed) Labs Reviewed - No data to display  EKG  EKG Interpretation None       Radiology No results found.  Procedures Procedures (including critical care time)  Medications Ordered in ED Medications  methylPREDNISolone sodium succinate (SOLU-MEDROL) 125 mg/2 mL injection 125 mg (125 mg Intravenous Given 07/04/17 2151)  diphenhydrAMINE (BENADRYL) injection 25 mg (25 mg Intravenous Given 07/04/17 2151)  sodium chloride 0.9 % bolus 1,000 mL (0 mLs Intravenous Stopped 07/04/17 2332)  HYDROmorphone (DILAUDID) injection 1 mg (1 mg Intravenous Given 07/04/17 2217)     Initial Impression / Assessment  and Plan / ED Course  I have reviewed the triage vital signs and the nursing notes.  Pertinent labs & imaging results that were available during my care of the patient were reviewed by me and considered in my medical decision making (see chart for details).     Patient was being being an local reaction. Patient has a history of allergic reaction to bee sting. He will continue Benadryl as needed  Final Clinical Impressions(s) / ED Diagnoses   Final diagnoses:  Allergic reaction, initial encounter    New Prescriptions Discharge Medication List as of 07/04/2017 11:14 PM       Milton Ferguson, MD 07/07/17 1349    Milton Ferguson, MD 07/15/17 [phone]

## 2017-07-15 ENCOUNTER — Ambulatory Visit (INDEPENDENT_AMBULATORY_CARE_PROVIDER_SITE_OTHER): Payer: Medicaid Other | Admitting: Family

## 2017-07-15 ENCOUNTER — Encounter: Payer: Self-pay | Admitting: Family

## 2017-07-15 VITALS — BP 127/85 | HR 109 | Temp 96.7°F | Ht 70.0 in | Wt 389.0 lb

## 2017-07-15 DIAGNOSIS — L739 Follicular disorder, unspecified: Secondary | ICD-10-CM | POA: Diagnosis not present

## 2017-07-15 MED ORDER — CEPHALEXIN 500 MG PO CAPS: 500.0000 mg | ORAL_CAPSULE | Freq: Three times a day (TID) | ORAL | 0 refills | Status: AC

## 2017-07-15 NOTE — Progress Notes (Signed)
   Subjective:    Patient ID: [name], male    DOB: March 21, 1986, 31 y.o.   MRN: [id number]  Rash  This is a new problem. The current episode started more than 1 month ago. The problem has been gradually worsening since onset. The affected locations include the scalp. The rash is characterized by itchiness, redness and pain. Pertinent negatives include no congestion, cough, diarrhea, joint pain, shortness of breath or vomiting. Past treatments include topical steroids. The treatment provided mild relief.      Review of Systems  HENT: Negative for congestion.   Respiratory: Negative for cough and shortness of breath.   Gastrointestinal: Negative for diarrhea and vomiting.  Musculoskeletal: Negative for joint pain.  Skin: Positive for rash.  All other systems reviewed and are negative.      Objective:   Physical Exam  Constitutional: He is oriented to person, place, and time. He appears well-developed and well-nourished. No distress.  HENT:  Head: Normocephalic.  Right Ear: External ear normal.  Left Ear: External ear normal.  Nose: Nose normal.  Mouth/Throat: Oropharynx is clear and moist.  Eyes: Pupils are equal, round, and reactive to light. Right eye exhibits no discharge. Left eye exhibits no discharge.  Neck: Normal range of motion. Neck supple. No thyromegaly present.  Cardiovascular: Normal rate, regular rhythm, normal heart sounds and intact distal pulses.   No murmur heard. Pulmonary/Chest: Effort normal and breath sounds normal. No respiratory distress. He has no wheezes.  Abdominal: Soft. Bowel sounds are normal. He exhibits no distension. There is no tenderness.  Musculoskeletal: Normal range of motion. He exhibits no edema or tenderness.  Neurological: He is alert and oriented to person, place, and time.  Skin: Skin is warm and dry. Rash noted. No erythema.  Erythemas papular rash on base of scalp   Psychiatric: He has a normal mood and affect. His behavior  is normal. Judgment and thought content normal.  Vitals reviewed.    BP 127/85   Pulse (!) 109   Temp (!) 96.7 F (35.9 C) (Oral)   Ht 5\' 10"  (1.778 m)   Wt (!) 389 lb (176.4 kg)   BMI 55.82 kg/m       Assessment & Plan:  1. Folliculitis Warm compresses Use chlorhexidine solution  Do not scratch or pick at RTO prn  - cephALEXin (KEFLEX) 500 MG capsule; Take 1 capsule (500 mg total) by mouth 3 (three) times daily.  Dispense: 30 capsule; Refill: 0   Evelina Dun, FNP

## 2017-07-15 NOTE — Patient Instructions (Signed)
Folliculitis Folliculitis is inflammation of the hair follicles. Folliculitis most commonly occurs on the scalp, thighs, legs, back, and buttocks. However, it can occur anywhere on the body. What are the causes? This condition may be caused by:  A bacterial infection (common).  A fungal infection.  A viral infection.  Coming into contact with certain chemicals, especially oils and tars.  Shaving or waxing.  Applying greasy ointments or creams to your skin often.  Long-lasting folliculitis and folliculitis that keeps coming back can be caused by bacteria that live in the nostrils. What increases the risk? This condition is more likely to develop in people with:  A weakened immune system.  Diabetes.  Obesity.  What are the signs or symptoms? Symptoms of this condition include:  Redness.  Soreness.  Swelling.  Itching.  Small white or yellow, pus-filled, itchy spots (pustules) that appear over a reddened area. If there is an infection that goes deep into the follicle, these may develop into a boil (furuncle).  A group of closely packed boils (carbuncle). These tend to form in hairy, sweaty areas of the body.  How is this diagnosed? This condition is diagnosed with a skin exam. To find what is causing the condition, your health care provider may take a sample of one of the pustules or boils for testing. How is this treated? This condition may be treated by:  Applying warm compresses to the affected areas.  Taking an antibiotic medicine or applying an antibiotic medicine to the skin.  Applying or bathing with an antiseptic solution.  Taking an over-the-counter medicine to help with itching.  Having a procedure to drain any pustules or boils. This may be done if a pustule or boil contains a lot of pus or fluid.  Laser hair removal. This may be done to treat long-lasting folliculitis.  Follow these instructions at home:  If directed, apply heat to the affected  area as often as told by your health care provider. Use the heat source that your health care provider recommends, such as a moist heat pack or a heating pad. ? Place a towel between your skin and the heat source. ? Leave the heat on for 20-30 minutes. ? Remove the heat if your skin turns bright red. This is especially important if you are unable to feel pain, heat, or cold. You may have a greater risk of getting burned.  If you were prescribed an antibiotic medicine, use it as told by your health care provider. Do not stop using the antibiotic even if you start to feel better.  Take over-the-counter and prescription medicines only as told by your health care provider.  Do not shave irritated skin.  Keep all follow-up visits as told by your health care provider. This is important. Get help right away if:  You have more redness, swelling, or pain in the affected area.  Red streaks are spreading from the affected area.  You have a fever. This information is not intended to replace advice given to you by your health care provider. Make sure you discuss any questions you have with your health care provider. Document Released: 01/20/2002 Document Revised: 05/31/2016 Document Reviewed: 09/01/2015 Elsevier Interactive Patient Education  2018 [employer]  

## 2017-07-22 ENCOUNTER — Other Ambulatory Visit: Payer: Self-pay | Admitting: Family Medicine

## 2017-07-22 DIAGNOSIS — K219 Gastro-esophageal reflux disease without esophagitis: Secondary | ICD-10-CM

## 2017-07-27 ENCOUNTER — Emergency Department (HOSPITAL_COMMUNITY): Payer: Medicaid Other

## 2017-07-27 ENCOUNTER — Encounter (HOSPITAL_COMMUNITY): Payer: Self-pay | Admitting: Emergency Medicine

## 2017-07-27 ENCOUNTER — Emergency Department (HOSPITAL_COMMUNITY)
Admission: EM | Admit: 2017-07-27 | Discharge: 2017-07-27 | Disposition: A | Discharge status: Home / Self Care | Payer: Medicaid Other | Attending: Emergency Medicine | Admitting: Emergency Medicine

## 2017-07-27 DIAGNOSIS — Z79899 Other long term (current) drug therapy: Secondary | ICD-10-CM | POA: Insufficient documentation

## 2017-07-27 DIAGNOSIS — N50812 Left testicular pain: Secondary | ICD-10-CM | POA: Insufficient documentation

## 2017-07-27 DIAGNOSIS — F17220 Nicotine dependence, chewing tobacco, uncomplicated: Secondary | ICD-10-CM | POA: Insufficient documentation

## 2017-07-27 DIAGNOSIS — R1030 Lower abdominal pain, unspecified: Secondary | ICD-10-CM | POA: Insufficient documentation

## 2017-07-27 DIAGNOSIS — N5082 Scrotal pain: Secondary | ICD-10-CM

## 2017-07-27 DIAGNOSIS — R1032 Left lower quadrant pain: Secondary | ICD-10-CM

## 2017-07-27 LAB — CBC WITH DIFFERENTIAL/PLATELET
Basophils Absolute: 0 10*3/uL (ref 0.0–0.1)
Basophils Relative: 1 %
EOS ABS: 0.2 10*3/uL (ref 0.0–0.7)
Eosinophils Relative: 2 %
HCT: 46.5 % (ref 39.0–52.0)
HEMOGLOBIN: 15.8 g/dL (ref 13.0–17.0)
LYMPHS ABS: 3.3 10*3/uL (ref 0.7–4.0)
Lymphocytes Relative: 38 %
MCH: 28.8 pg (ref 26.0–34.0)
MCHC: 34 g/dL (ref 30.0–36.0)
MCV: 84.7 fL (ref 78.0–100.0)
Monocytes Absolute: 0.5 10*3/uL (ref 0.1–1.0)
Monocytes Relative: 6 %
NEUTROS PCT: 53 %
Neutro Abs: 4.6 10*3/uL (ref 1.7–7.7)
Platelets: 292 10*3/uL (ref 150–400)
RBC: 5.49 MIL/uL (ref 4.22–5.81)
RDW: 14.1 % (ref 11.5–15.5)
WBC: 8.6 10*3/uL (ref 4.0–10.5)

## 2017-07-27 LAB — URINALYSIS, ROUTINE W REFLEX MICROSCOPIC
BILIRUBIN URINE: NEGATIVE
Glucose, UA: NEGATIVE mg/dL
Hgb urine dipstick: NEGATIVE
KETONES UR: NEGATIVE mg/dL
LEUKOCYTES UA: NEGATIVE
NITRITE: NEGATIVE
Protein, ur: NEGATIVE mg/dL
SPECIFIC GRAVITY, URINE: 1.02 (ref 1.005–1.030)
pH: 8 (ref 5.0–8.0)

## 2017-07-27 LAB — BASIC METABOLIC PANEL
Anion gap: 8 (ref 5–15)
BUN: 16 mg/dL (ref 6–20)
CHLORIDE: 102 mmol/L (ref 101–111)
CO2: 29 mmol/L (ref 22–32)
Calcium: 9.3 mg/dL (ref 8.9–10.3)
Creatinine, Ser: 1.08 mg/dL (ref 0.61–1.24)
GFR calc non Af Amer: >60 mL/min (ref >60)
Glucose, Bld: 87 mg/dL (ref 65–99)
Potassium: 4 mmol/L (ref 3.5–5.1)
SODIUM: 139 mmol/L (ref 135–145)

## 2017-07-27 MED ORDER — HYDROCODONE-ACETAMINOPHEN 5-325 MG PO TABS: 1.0000 | ORAL_TABLET | Freq: Four times a day (QID) | ORAL | 0 refills | Status: DC | PRN

## 2017-07-27 MED ORDER — OXYCODONE HCL 5 MG PO TABS: 5.0000 mg | ORAL_TABLET | Freq: Four times a day (QID) | ORAL | 0 refills | Status: DC | PRN

## 2017-07-27 MED ORDER — HYDROMORPHONE HCL 1 MG/ML IJ SOLN
1.0000 mg | Freq: Once | INTRAMUSCULAR | Status: AC
  Administered 2017-07-27: 1 mg via INTRAMUSCULAR
  Filled 2017-07-27: qty 1

## 2017-07-27 MED ORDER — CYCLOBENZAPRINE HCL 10 MG PO TABS: 10.0000 mg | ORAL_TABLET | Freq: Three times a day (TID) | ORAL | 0 refills | Status: DC | PRN

## 2017-07-27 MED ORDER — HYDROMORPHONE HCL 1 MG/ML IJ SOLN
0.5000 mg | Freq: Once | INTRAMUSCULAR | Status: AC
  Administered 2017-07-27: 0.5 mg via INTRAVENOUS
  Filled 2017-07-27: qty 1

## 2017-07-27 NOTE — Discharge Instructions (Signed)
Follow up with your md in one week for recheck °

## 2017-07-27 NOTE — ED Notes (Signed)
Pt transported to CT with Marylyn Ishihara

## 2017-07-27 NOTE — ED Provider Notes (Signed)
[facility] DEPT Provider Note   CSN: [id number] Arrival date & time: 07/27/17  1143     History   Chief Complaint Chief Complaint  Patient presents with  . Back Pain    HPI [name] is a 31 y.o. male.  Patient complains of left groin pain and pain in his left testicle. This started when he was riding a lawnmower doing lawn work   The history is provided by the patient. No language interpreter was used.  Illness  This is a new problem. The current episode started more than 2 days ago. The problem occurs constantly. The problem has not changed since onset.Pertinent negatives include no chest pain, no abdominal pain and no headaches. Exacerbated by: movement. Nothing relieves the symptoms. He has tried nothing for the symptoms. The treatment provided no relief.    Past Medical History:  Diagnosis Date  . ADHD (attention deficit hyperactivity disorder)   . Anxiety   . Bipolar disorder ([place])   . Chronic lower back pain   . Depression   . Esophagitis    Distal esophageal erosions consistent with mild erosive  reflux esophagitis 12/2008 by EGD   . Gastric ulcer   . GERD (gastroesophageal reflux disease)   . Hiatal hernia   . History of stomach ulcers   . Hx MRSA infection 8/08   right thigh  . Hypercholesteremia   . Obesity   . Occult GI bleeding 12/2008   Trivial upper GI bleed/uncontrolled GERD by upper endoscopy 12/2008, normal f/u endoscopy 03/2009 with SBCE at that time  . OSA on CPAP   . Pneumonia 09/01/2015   "on ATB still" (09/04/2015)  . S/P colonoscopy 11/10, 10/08   Dr [name]  . Schizophrenia [facility])   . Thyroid function test abnormal    Noted in 2011 discharge  . Tobacco dipper     Patient Active Problem List   Diagnosis Date Noted  . Wound drainage 09/04/2015  . Seasonal and perennial allergic rhinitis 07/23/2014  . Abdominal mass of other site 06/15/2012  . Abdominal cramps 06/15/2012  . Obstructive sleep apnea 05/03/2012  . Syncope  and collapse 05/03/2012  . Chest pain 05/03/2012  . Obesity, morbid ([place]) 05/03/2012  . Anal fissure 05/27/2011  . UNSPECIFIED ANEMIA 09/11/2009  . Blood in stool 04/06/2009  . ABDOMINAL PAIN OTHER SPECIFIED SITE 03/07/2009  . BIPOLAR DISORDER UNSPECIFIED 03/06/2009  . SMOKELESS TOBACCO ABUSE 03/06/2009  . ATTENTION DEFICIT HYPERACTIVITY DISORDER 03/06/2009  . GERD 03/06/2009  . NAUSEA AND VOMITING 03/06/2009  . DIARRHEA 03/06/2009    Past Surgical History:  Procedure Laterality Date  . ANKLE FRACTURE SURGERY Left ~ 2008  . BACK SURGERY    . COLONOSCOPY  10/10/2009   anal papilla otherwise normal  . ESOPHAGOGASTRODUODENOSCOPY   04/07/2009   Normal esophagus, small hiatal hernia  . ESOPHAGOGASTRODUODENOSCOPY  01/09/2009   Distal esophageal erosions consistent with mild erosive reflux esophagitis, otherwise normal esophagus, small hiatal herniaotherwise normal stomach, D1-D2   . ESOPHAGOGASTRODUODENOSCOPY  08/26/2007   Normal esophagus, a small hiatal/hernia, otherwise normal stomach D1 through D3  . FRACTURE SURGERY    . ileocolonoscopy  08/26/2007    A normal rectum, colon, and terminal ileum  . INCISION AND DRAINAGE  09/04/2015   "reopened my back incsion"  . LUMBAR MICRODISCECTOMY  08/21/2015  . LUMBAR WOUND DEBRIDEMENT N/A 09/04/2015   Procedure: LUMBAR WOUND DEBRIDEMENT;  Surgeon: Consuella Lose, MD;  Location: [place] [facility];  Service: Neurosurgery;  Laterality: N/A;  . Small  bowel capsule  04/11/2009    normal throughout  . VASECTOMY         Home Medications    Prior to Admission medications   Medication Sig Start Date End Date Taking? Authorizing Provider  albuterol (PROVENTIL HFA;VENTOLIN HFA) 108 (90 Base) MCG/ACT inhaler Inhale 1-2 puffs into the lungs every 6 (six) hours as needed for wheezing or shortness of breath. 11/26/16  Yes Leaphart, Zack Seal, PA-C  cyclobenzaprine (FLEXERIL) 10 MG tablet Take 10 mg by mouth 3 (three) times daily as needed for muscle  spasms.   Yes [provider]  diphenhydrAMINE (BENADRYL) 25 mg capsule Take 50 mg by mouth once as needed for allergies.   Yes [provider]  doxepin (SINEQUAN) 10 MG capsule Take 10 mg by mouth at bedtime.    Yes [provider]  EPINEPHrine 0.3 mg/0.3 mL IJ SOAJ injection Inject 0.3 mg into the muscle once.   Yes [provider]  gabapentin (NEURONTIN) 300 MG capsule Take 300 mg by mouth 3 (three) times daily.   Yes [provider]  hydrOXYzine (ATARAX/VISTARIL) 25 MG tablet Take 1-2 tablets (25-50 mg total) by mouth every 6 (six) hours as needed for itching (may cause drowsiness; do not take Zyrtec with this). 06/07/17  Yes Molpus, John, MD  lamoTRIgine (LAMICTAL) 100 MG tablet Take 1 tablet (100 mg total) by mouth daily. 09/24/16  Yes Stacks, Cletus Gash, MD  oxycodone (OXY-IR) 5 MG capsule Take 10 mg by mouth 3 (three) times daily.    Yes [provider]  pantoprazole (PROTONIX) 40 MG tablet TAKE ONE TABLET BY MOUTH TWICE DAILY 07/23/17  Yes Claretta Fraise, MD  traZODone (DESYREL) 50 MG tablet Take 50 mg by mouth at bedtime.   Yes [provider]  HYDROcodone-acetaminophen (NORCO/VICODIN) 5-325 MG tablet Take 1 tablet by mouth every 6 (six) hours as needed for moderate pain. 07/27/17   Milton Ferguson, MD    Family History Family History  Problem Relation Age of Onset  . Leukemia Father 81  . Seizures Mother   . Bipolar disorder Brother   . ADD / ADHD Brother   . Colon cancer Neg Hx     Social History Social History  Substance Use Topics  . Smoking status: Never Smoker  . Smokeless tobacco: Current User    Types: Snuff  . Alcohol use No     Allergies   Ibuprofen; Influenza vaccines; Tylenol [acetaminophen]; Bee venom; and Tramadol   Review of Systems Review of Systems  Constitutional: Negative for appetite change and fatigue.  HENT: Negative for congestion, ear discharge and sinus pressure.   Eyes: Negative for  discharge.  Respiratory: Negative for cough.   Cardiovascular: Negative for chest pain.  Gastrointestinal: Negative for abdominal pain and diarrhea.  Genitourinary: Negative for frequency and hematuria.       Left groin and left testicle pain  Musculoskeletal: Negative for back pain.  Skin: Negative for rash.  Neurological: Negative for seizures and headaches.  Psychiatric/Behavioral: Negative for hallucinations.     Physical Exam Updated Vital Signs BP 133/75 (BP Location: Left Arm)   Pulse 86   Temp 97.9 F (36.6 C) (Oral)   Resp 19   Ht 5\' 10"  (1.778 m)   Wt (!) 176.4 kg (389 lb)   SpO2 97%   BMI 55.82 kg/m   Physical Exam  Constitutional: He is oriented to person, place, and time. He appears well-developed.  HENT:  Head: Normocephalic.  Eyes: Conjunctivae and EOM are  normal. No scleral icterus.  Neck: Neck supple. No thyromegaly present.  Cardiovascular: Normal rate and regular rhythm.  Exam reveals no gallop and no friction rub.   No murmur heard. Pulmonary/Chest: No stridor. He has no wheezes. He has no rales. He exhibits no tenderness.  Abdominal: He exhibits no distension. There is no tenderness. There is no rebound.  Genitourinary:  Genitourinary Comments: Moderate tenderness to left groin and left testicle  Musculoskeletal: Normal range of motion. He exhibits no edema.  Lymphadenopathy:    He has no cervical adenopathy.  Neurological: He is oriented to person, place, and time. He exhibits normal muscle tone. Coordination normal.  Skin: No rash noted. No erythema.  Psychiatric: He has a normal mood and affect. His behavior is normal.     ED Treatments / Results  Labs (all labs ordered are listed, but only abnormal results are displayed) Labs Reviewed  URINALYSIS, ROUTINE W REFLEX MICROSCOPIC - Abnormal; Notable for the following:       Result Value   APPearance HAZY (*)    All other components within normal limits  CBC WITH DIFFERENTIAL/PLATELET  BASIC  METABOLIC PANEL    EKG  EKG Interpretation None       Radiology US Scrotum  Result Date: 07/27/2017 CLINICAL DATA:  One-week history of bilateral testicular pain. EXAM: SCROTAL ULTRASOUND DOPPLER ULTRASOUND OF THE TESTICLES TECHNIQUE: Complete ultrasound examination of the testicles, epididymis, and other scrotal structures was performed. Color and spectral Doppler ultrasound were also utilized to evaluate blood flow to the testicles. COMPARISON:  05/07/2017 [place]. FINDINGS: Right testicle Measurements: Approximately 4.8 x 2.5 x 3.2 cm. Normal parenchymal echotexture without mass or microlithiasis. Normal color Doppler flow without evidence of hyperemia. Left testicle Measurements: Approximately 4.7 x 2.6 x 3.7 cm. Normal parenchymal echotexture without mass or microlithiasis. Normal color Doppler flow without evidence of hyperemia. Right epididymis: 3 mm cyst or spermatocele in the epididymal head. No evidence of hyperemia on color Doppler evaluation. Left epididymis: 5 mm cyst or spermatocele in the epididymal head. No evidence of hyperemia on color Doppler evaluation. Hydrocele:  Very small left hydrocele.  No right hydrocele. Varicocele:  None visualized. Pulsed Doppler interrogation of both testes demonstrates normal low resistance arterial and venous waveforms bilaterally. IMPRESSION: No acute or significant abnormalities. Specifically, no evidence of testicular torsion or epididymo-orchitis. Electronically Signed   By: Evangeline Dakin M.D.   On: 07/27/2017 13:57   US Pelvic Doppler (torsion R/o Or Mass Arterial Flow)  Result Date: 07/27/2017 CLINICAL DATA:  One-week history of bilateral testicular pain. EXAM: SCROTAL ULTRASOUND DOPPLER ULTRASOUND OF THE TESTICLES TECHNIQUE: Complete ultrasound examination of the testicles, epididymis, and other scrotal structures was performed. Color and spectral Doppler ultrasound were also utilized to evaluate blood flow to the  testicles. COMPARISON:  05/07/2017 [place]. FINDINGS: Right testicle Measurements: Approximately 4.8 x 2.5 x 3.2 cm. Normal parenchymal echotexture without mass or microlithiasis. Normal color Doppler flow without evidence of hyperemia. Left testicle Measurements: Approximately 4.7 x 2.6 x 3.7 cm. Normal parenchymal echotexture without mass or microlithiasis. Normal color Doppler flow without evidence of hyperemia. Right epididymis: 3 mm cyst or spermatocele in the epididymal head. No evidence of hyperemia on color Doppler evaluation. Left epididymis: 5 mm cyst or spermatocele in the epididymal head. No evidence of hyperemia on color Doppler evaluation. Hydrocele:  Very small left hydrocele.  No right hydrocele. Varicocele:  None visualized. Pulsed Doppler interrogation of both testes demonstrates  normal low resistance arterial and venous waveforms bilaterally. IMPRESSION: No acute or significant abnormalities. Specifically, no evidence of testicular torsion or epididymo-orchitis. Electronically Signed   By: Evangeline Dakin M.D.   On: 07/27/2017 13:57   Ct Renal Stone Study  Result Date: 07/27/2017 CLINICAL DATA:  Lower abdomen pain for several days EXAM: CT ABDOMEN AND PELVIS WITHOUT CONTRAST TECHNIQUE: Multidetector CT imaging of the abdomen and pelvis was performed following the standard protocol without IV contrast. COMPARISON:  February 21, 2016 FINDINGS: Lower chest: No acute abnormality. Hepatobiliary: There is diffuse low density of the liver without focal liver lesion. The gallbladder and biliary tree are normal. Pancreas: Unremarkable. No pancreatic ductal dilatation or surrounding inflammatory changes. Spleen: Normal in size without focal abnormality. Adrenals/Urinary Tract: The adrenal glands are normal. There is a 3.1 x 2.6 cm low-density lesion in the anterior midpole left kidney unchanged. This is probably a cyst. No nephrolithiasis or hydroureteronephrosis is identified  bilaterally. The bladder is normal. Stomach/Bowel: Stomach is within normal limits. The appendix is not seen. No inflammatory changes noted around the cecum. No evidence of bowel wall thickening, distention, or inflammatory changes. Vascular/Lymphatic: No significant vascular findings are present. No enlarged abdominal or pelvic lymph nodes. Reproductive: Prostate is unremarkable. Other: No abdominal wall hernia or abnormality. No abdominopelvic ascites. Musculoskeletal: No acute or significant osseous findings. Minimal degenerative joint changes of the spine are noted. IMPRESSION: No acute abnormality identified in the abdomen and pelvis. No nephrolithiasis or hydroureteronephrosis bilaterally. No bowel obstruction. Fatty infiltration of liver. Left kidney cyst unchanged. Electronically Signed   By: Abelardo Diesel M.D.   On: 07/27/2017 17:00    Procedures Procedures (including critical care time)  Medications Ordered in ED Medications  HYDROmorphone (DILAUDID) injection 1 mg (1 mg Intramuscular Given 07/27/17 1314)  HYDROmorphone (DILAUDID) injection 0.5 mg (0.5 mg Intravenous Given 07/27/17 1620)     Initial Impression / Assessment and Plan / ED Course  I have reviewed the triage vital signs and the nursing notes.  Pertinent labs & imaging results that were available during my care of the patient were reviewed by me and considered in my medical decision making (see chart for details).   [place] of scrotum,  Ct abd and u/a neg.  Suspect pain is related to groin strain    Final Clinical Impressions(s) / ED Diagnoses   Final diagnoses:  Groin discomfort, left    New Prescriptions New Prescriptions   HYDROCODONE-ACETAMINOPHEN (NORCO/VICODIN) 5-325 MG TABLET    Take 1 tablet by mouth every 6 (six) hours as needed for moderate pain.     Milton Ferguson, MD 07/27/17 1721

## 2017-07-27 NOTE — ED Notes (Signed)
Pt returned from CT °

## 2017-07-27 NOTE — ED Triage Notes (Signed)
Pt c/o of back pain and groin pain since Monday. No new injuries. No urinary symptoms.  Pt states " sharp pain that shoots down to my groin"

## 2017-07-29 ENCOUNTER — Emergency Department (HOSPITAL_COMMUNITY): Payer: Medicaid Other

## 2017-07-29 ENCOUNTER — Emergency Department (HOSPITAL_COMMUNITY)
Admission: EM | Admit: 2017-07-29 | Discharge: 2017-07-29 | Disposition: A | Discharge status: Home / Self Care | Payer: Medicaid Other | Attending: Emergency Medicine | Admitting: Emergency Medicine

## 2017-07-29 ENCOUNTER — Encounter (HOSPITAL_COMMUNITY): Payer: Self-pay | Admitting: *Deleted

## 2017-07-29 DIAGNOSIS — M549 Dorsalgia, unspecified: Secondary | ICD-10-CM | POA: Diagnosis not present

## 2017-07-29 DIAGNOSIS — F17220 Nicotine dependence, chewing tobacco, uncomplicated: Secondary | ICD-10-CM | POA: Insufficient documentation

## 2017-07-29 DIAGNOSIS — R102 Pelvic and perineal pain: Secondary | ICD-10-CM | POA: Diagnosis not present

## 2017-07-29 DIAGNOSIS — R52 Pain, unspecified: Secondary | ICD-10-CM

## 2017-07-29 LAB — CBC WITH DIFFERENTIAL/PLATELET
BASOS ABS: 0 10*3/uL (ref 0.0–0.1)
Basophils Relative: 0 %
EOS PCT: 2 %
Eosinophils Absolute: 0.2 10*3/uL (ref 0.0–0.7)
HCT: 45.4 % (ref 39.0–52.0)
Hemoglobin: 15.2 g/dL (ref 13.0–17.0)
LYMPHS PCT: 28 %
Lymphs Abs: 2.9 10*3/uL (ref 0.7–4.0)
MCH: 28.4 pg (ref 26.0–34.0)
MCHC: 33.5 g/dL (ref 30.0–36.0)
MCV: 84.7 fL (ref 78.0–100.0)
MONO ABS: 0.6 10*3/uL (ref 0.1–1.0)
MONOS PCT: 6 %
Neutro Abs: 6.4 10*3/uL (ref 1.7–7.7)
Neutrophils Relative %: 64 %
PLATELETS: 277 10*3/uL (ref 150–400)
RBC: 5.36 MIL/uL (ref 4.22–5.81)
RDW: 13.9 % (ref 11.5–15.5)
WBC: 10.1 10*3/uL (ref 4.0–10.5)

## 2017-07-29 LAB — COMPREHENSIVE METABOLIC PANEL
ALT: 36 U/L (ref 17–63)
ANION GAP: 7 (ref 5–15)
AST: 26 U/L (ref 15–41)
Albumin: 4.1 g/dL (ref 3.5–5.0)
Alkaline Phosphatase: 55 U/L (ref 38–126)
BUN: 13 mg/dL (ref 6–20)
CHLORIDE: 101 mmol/L (ref 101–111)
CO2: 29 mmol/L (ref 22–32)
Calcium: 9.2 mg/dL (ref 8.9–10.3)
Creatinine, Ser: 0.99 mg/dL (ref 0.61–1.24)
Glucose, Bld: 96 mg/dL (ref 65–99)
POTASSIUM: 4.3 mmol/L (ref 3.5–5.1)
Sodium: 137 mmol/L (ref 135–145)
Total Bilirubin: 0.5 mg/dL (ref 0.3–1.2)
Total Protein: 7 g/dL (ref 6.5–8.1)

## 2017-07-29 LAB — URINALYSIS, ROUTINE W REFLEX MICROSCOPIC
Bilirubin Urine: NEGATIVE
Glucose, UA: NEGATIVE mg/dL
Hgb urine dipstick: NEGATIVE
Ketones, ur: NEGATIVE mg/dL
LEUKOCYTES UA: NEGATIVE
NITRITE: NEGATIVE
PROTEIN: NEGATIVE mg/dL
SPECIFIC GRAVITY, URINE: 1.018 (ref 1.005–1.030)
pH: 5 (ref 5.0–8.0)

## 2017-07-29 MED ORDER — HYDROMORPHONE HCL 1 MG/ML IJ SOLN
1.0000 mg | Freq: Once | INTRAMUSCULAR | Status: AC
  Administered 2017-07-29: 1 mg via INTRAVENOUS
  Filled 2017-07-29: qty 1

## 2017-07-29 MED ORDER — GABAPENTIN 300 MG PO CAPS: 600.0000 mg | ORAL_CAPSULE | Freq: Three times a day (TID) | ORAL | 0 refills | Status: DC

## 2017-07-29 MED ORDER — IOPAMIDOL (ISOVUE-300) INJECTION 61%
100.0000 mL | Freq: Once | INTRAVENOUS | Status: AC | PRN
  Administered 2017-07-29: 100 mL via INTRAVENOUS

## 2017-07-29 MED ORDER — PREDNISONE 20 MG PO TABS: ORAL_TABLET | ORAL | 0 refills | Status: DC

## 2017-07-29 NOTE — ED Triage Notes (Signed)
Pt comes in with testicular pain starting last Monday. Pt was seen here 2 days ago for same and had an [place] and CT done, both were negative. Pt states pain is worse today, denies any additional swelling or trouble urinating.

## 2017-07-29 NOTE — ED Provider Notes (Signed)
Emergency Department Provider Note   I have reviewed the triage vital signs and the nursing notes.   HISTORY  Chief Complaint Groin Pain   HPI [name] is a 31 y.o. male without significant past medical history the presents to the emergency department today secondary to perineal pain. Patient states he was seen here couple days ago for the same. Symptoms seemed to start about a week ago. Progressively worsening. [name] it is a sharp pain that starts just behind his scrotum and radiates straight up towards his back. No fevers or chills but does have some nausea intermittently. Got a prescription pain medication in the emergency department other day that did not seem to help. His workup then had a CT stone study and an ultrasound that were both negative.has had back surgery before. No symptoms really similar to this in the past. No history of kidney stones. No other associated modifying symptoms. No shortness of breath, chest pain, vision changes or rashes.   Past Medical History:  Diagnosis Date  . ADHD (attention deficit hyperactivity disorder)   . Anxiety   . Bipolar disorder ([place])   . Chronic lower back pain   . Depression   . Esophagitis    Distal esophageal erosions consistent with mild erosive  reflux esophagitis 12/2008 by EGD   . Gastric ulcer   . GERD (gastroesophageal reflux disease)   . Hiatal hernia   . History of stomach ulcers   . Hx MRSA infection 8/08   right thigh  . Hypercholesteremia   . Obesity   . Occult GI bleeding 12/2008   Trivial upper GI bleed/uncontrolled GERD by upper endoscopy 12/2008, normal f/u endoscopy 03/2009 with SBCE at that time  . OSA on CPAP   . Pneumonia 09/01/2015   "on ATB still" (09/04/2015)  . S/P colonoscopy 11/10, 10/08   Dr [name]  . Schizophrenia [facility])   . Thyroid function test abnormal    Noted in 2011 discharge  . Tobacco dipper     Patient Active Problem List   Diagnosis Date Noted  . Wound drainage  09/04/2015  . Seasonal and perennial allergic rhinitis 07/23/2014  . Abdominal mass of other site 06/15/2012  . Abdominal cramps 06/15/2012  . Obstructive sleep apnea 05/03/2012  . Syncope and collapse 05/03/2012  . Chest pain 05/03/2012  . Obesity, morbid ([place]) 05/03/2012  . Anal fissure 05/27/2011  . UNSPECIFIED ANEMIA 09/11/2009  . Blood in stool 04/06/2009  . ABDOMINAL PAIN OTHER SPECIFIED SITE 03/07/2009  . BIPOLAR DISORDER UNSPECIFIED 03/06/2009  . SMOKELESS TOBACCO ABUSE 03/06/2009  . ATTENTION DEFICIT HYPERACTIVITY DISORDER 03/06/2009  . GERD 03/06/2009  . NAUSEA AND VOMITING 03/06/2009  . DIARRHEA 03/06/2009    Past Surgical History:  Procedure Laterality Date  . ANKLE FRACTURE SURGERY Left ~ 2008  . BACK SURGERY    . COLONOSCOPY  10/10/2009   anal papilla otherwise normal  . ESOPHAGOGASTRODUODENOSCOPY   04/07/2009   Normal esophagus, small hiatal hernia  . ESOPHAGOGASTRODUODENOSCOPY  01/09/2009   Distal esophageal erosions consistent with mild erosive reflux esophagitis, otherwise normal esophagus, small hiatal herniaotherwise normal stomach, D1-D2   . ESOPHAGOGASTRODUODENOSCOPY  08/26/2007   Normal esophagus, a small hiatal/hernia, otherwise normal stomach D1 through D3  . FRACTURE SURGERY    . ileocolonoscopy  08/26/2007    A normal rectum, colon, and terminal ileum  . INCISION AND DRAINAGE  09/04/2015   "reopened my back incsion"  . LUMBAR MICRODISCECTOMY  08/21/2015  . LUMBAR WOUND DEBRIDEMENT N/A  09/04/2015   Procedure: LUMBAR WOUND DEBRIDEMENT;  Surgeon: Consuella Lose, MD;  Location: [place] [facility];  Service: Neurosurgery;  Laterality: N/A;  . Small bowel capsule  04/11/2009    normal throughout  . VASECTOMY      Current Outpatient Rx  . Order #: [id number] Class: Print  . Order #: [id number] Class: Historical Med  . Order #: [id number] Class: Print  . Order #: [id number] Class: Historical Med  . Order #: [id number] Class: Historical Med  . Order #:  [id number] Class: Historical Med  . Order #: [id number] Class: Historical Med  . Order #: [id number] Class: Print  . Order #: [id number] Class: Normal  . Order #: [id number] Class: Historical Med  . Order #: [id number] Class: Print  . Order #: [id number] Class: Normal  . Order #: [id number] Class: Historical Med  . Order #: [id number] Class: Print  . Order #: [id number] Class: Print    Allergies Ibuprofen; Influenza vaccines; Tylenol [acetaminophen]; Bee venom; and Tramadol  Family History  Problem Relation Age of Onset  . Leukemia Father 56  . Seizures Mother   . Bipolar disorder Brother   . ADD / ADHD Brother   . Colon cancer Neg Hx     Social History Social History  Substance Use Topics  . Smoking status: Never Smoker  . Smokeless tobacco: Current User    Types: Snuff  . Alcohol use No    Review of Systems  All other systems negative except as documented in the HPI. All pertinent positives and negatives as reviewed in the HPI. ____________________________________________  PHYSICAL EXAM:  VITAL SIGNS: ED Triage Vitals  Enc Vitals Group     BP 07/29/17 1612 140/82     Pulse Rate 07/29/17 1612 (!) 108     Resp 07/29/17 1612 20     Temp 07/29/17 1612 (!) 97.5 F (36.4 C)     Temp Source 07/29/17 1612 Oral     SpO2 07/29/17 1612 100 %     Weight 07/29/17 1611 (!) 389 lb (176.4 kg)     Height --      Head Circumference --      Peak Flow --      Pain Score 07/29/17 1614 6     Pain Loc --      Pain Edu? --      Excl. in [place]? --     Constitutional: Alert and oriented. Well appearing and in no acute distress. Eyes: Conjunctivae are normal. PERRL. EOMI. Head: Atraumatic. Nose: No congestion/rhinnorhea. Mouth/Throat: Mucous membranes are moist.  Oropharynx non-erythematous. Neck: No stridor.  No meningeal signs.   Cardiovascular: Normal rate, regular rhythm. Good peripheral circulation. Grossly normal heart sounds.   Respiratory: Normal respiratory effort.  No retractions. Lungs  CTAB. Gastrointestinal: Soft and nontender. No distention.  Musculoskeletal: No lower extremity tenderness nor edema. No gross deformities of extremities. Neurologic:  Normal speech and language. No gross focal neurologic deficits are appreciated.  Skin:  Skin is warm, dry and intact. No rash noted. GU: tenderness to bilateral testicles but no deformities, erythema, redness, edema or crepitus. ____________________________________________   LABS (all labs ordered are listed, but only abnormal results are displayed)  Labs Reviewed  CBC WITH DIFFERENTIAL/PLATELET  COMPREHENSIVE METABOLIC PANEL  URINALYSIS, ROUTINE W REFLEX MICROSCOPIC   ____________________________________________   RADIOLOGY  US Scrotum  Result Date: 07/29/2017 CLINICAL DATA:  Testicular pain. EXAM: SCROTAL ULTRASOUND DOPPLER ULTRASOUND OF THE TESTICLES TECHNIQUE: Complete ultrasound examination of the testicles, epididymis, and other scrotal structures was performed. Color and spectral Doppler ultrasound were also utilized to evaluate  blood flow to the testicles. COMPARISON:  July 27, 2017. FINDINGS: Right testicle Measurements: 4.7 x 2.4 x 3.5 cm. No mass or microlithiasis visualized. Left testicle Measurements: 4.7 x 2.4 x 3.37. No mass or microlithiasis visualized. Right epididymis:  Normal in size and appearance. Left epididymis:  Normal in size and appearance. Hydrocele:  Trace right and small left hydroceles. Varicocele:  None visualized. Pulsed Doppler interrogation of both testes demonstrates normal low resistance arterial and venous waveforms bilaterally. IMPRESSION: 1. Normal sonographic appearance of the bilateral testicles. No sonographic evidence of testicular torsion or epididymo-orchitis. 2. Trace right and small left hydroceles. Electronically Signed   By: Titus Dubin M.D.   On: 07/29/2017 17:15   Ct Abdomen Pelvis W Contrast  Result Date: 07/29/2017 CLINICAL DATA:  Abdominal pain.  Patient reports  testicular pain. EXAM: CT ABDOMEN AND PELVIS WITH CONTRAST TECHNIQUE: Multidetector CT imaging of the abdomen and pelvis was performed using the standard protocol following bolus administration of intravenous contrast. CONTRAST:  16mL ISOVUE-300 IOPAMIDOL (ISOVUE-300) INJECTION 61% COMPARISON:  Abdominal CT 2 days prior 07/27/2017 FINDINGS: Lower chest: Hypoventilatory change. No consolidation. No pleural fluid. Hepatobiliary: The liver is enlarged spanning 22 cm cranial caudal with hepatic steatosis. No evidence of focal lesion. Gallbladder physiologically distended, no calcified stone. No biliary dilatation. Pancreas: No ductal dilatation or inflammation. Spleen: Normal in size without focal abnormality. Small splenule at the hilum. Adrenals/Urinary Tract: Adrenal glands are unremarkable. No renal calculi, follow focal lesion, or hydronephrosis. Unchanged 3.3 cm cyst in the mid left kidney. Bladder is unremarkable. Stomach/Bowel: Stomach is nondistended. No small bowel dilatation or inflammation. No bowel obstruction. Small to moderate colonic stool burden without colonic wall thickening. Appendix not visualized, no right lower quadrant inflammation to suggest appendicitis. Vascular/Lymphatic: Small reactive appearing retroperitoneal nodes. No enlarged abdominal or pelvic adenopathy. Normal caliber abdominal aorta. Reproductive: Prostate is unremarkable. Other: No free air, free fluid, or intra-abdominal fluid collection. No inguinal hernia. Musculoskeletal: There are no acute or suspicious osseous abnormalities. Dedicated lumbar spine CT reformats performed concurrently, reported separately. IMPRESSION: 1. No acute abnormality or change from prior exam. 2. Hepatomegaly and hepatic steatosis. Electronically Signed   By: Jeb Levering M.D.   On: 07/29/2017 20:46   US Pelvic Doppler (torsion R/o Or Mass Arterial Flow)  Result Date: 07/29/2017 CLINICAL DATA:  Testicular pain. EXAM: SCROTAL ULTRASOUND DOPPLER  ULTRASOUND OF THE TESTICLES TECHNIQUE: Complete ultrasound examination of the testicles, epididymis, and other scrotal structures was performed. Color and spectral Doppler ultrasound were also utilized to evaluate blood flow to the testicles. COMPARISON:  July 27, 2017. FINDINGS: Right testicle Measurements: 4.7 x 2.4 x 3.5 cm. No mass or microlithiasis visualized. Left testicle Measurements: 4.7 x 2.4 x 3.37. No mass or microlithiasis visualized. Right epididymis:  Normal in size and appearance. Left epididymis:  Normal in size and appearance. Hydrocele:  Trace right and small left hydroceles. Varicocele:  None visualized. Pulsed Doppler interrogation of both testes demonstrates normal low resistance arterial and venous waveforms bilaterally. IMPRESSION: 1. Normal sonographic appearance of the bilateral testicles. No sonographic evidence of testicular torsion or epididymo-orchitis. 2. Trace right and small left hydroceles. Electronically Signed   By: Titus Dubin M.D.   On: 07/29/2017 17:15   Ct L-spine No Charge  Result Date: 07/29/2017 CLINICAL DATA:  Lumbar back pain. EXAM: CT LUMBAR SPINE WITHOUT CONTRAST TECHNIQUE: Multidetector CT imaging of the lumbar spine was performed without intravenous contrast administration. Multiplanar CT image reconstructions were also generated. COMPARISON:  Lumbar spine MRI  02/25/2015 FINDINGS: Segmentation: 5 lumbar-type vertebra. Alignment: Unchanged retrolisthesis of L5 on S1. Alignment otherwise maintained. Vertebrae: Vertebral body heights are normal. No acute fracture. Post left L5 laminectomy. Paraspinal and other soft tissues: Negative. Disc levels: Disc space narrowing at L5-S1. Facet hypertrophy at L5-S1 and to a lesser extent L4-L5. Probable disc bulge at L4-L5. Soft tissue attenuation from habitus limits more detailed assessment. No disc space narrowing from T12-L1 through L3-L4. IMPRESSION: 1. No acute osseous abnormality of the lumbar spine. 2. L5-S1 disc  space narrowing. Facet arthropathy at L4-L5 and L5-S1. Consider MRI for more detailed evaluation based on clinical concern. Electronically Signed   By: Jeb Levering M.D.   On: 07/29/2017 20:54   ____________________________________________   PROCEDURES  Procedure(s) performed:   Procedures ____________________________________________   INITIAL IMPRESSION / ASSESSMENT AND PLAN / ED COURSE  Pertinent labs & imaging results that were available during my care of the patient were reviewed by me and considered in my medical decision making (see chart for details).  Ultrasounds done in triage were negative. His could also possibly be an infection that I do not see an versus a primary spinal nerve issue. We'll add on CT scan with contrast to include lumbar studies.  Workup without cause for symptoms. Possibly neurologic? Will do prednisone taper, NSG and urology follow up.   ____________________________________________  FINAL CLINICAL IMPRESSION(S) / ED DIAGNOSES  Final diagnoses:  Back pain  Perineal pain    MEDICATIONS GIVEN DURING THIS VISIT:  Medications  HYDROmorphone (DILAUDID) injection 1 mg (1 mg Intravenous Given 07/29/17 1920)  iopamidol (ISOVUE-300) 61 % injection 100 mL (100 mLs Intravenous Contrast Given 07/29/17 2011)    NEW OUTPATIENT MEDICATIONS STARTED DURING THIS VISIT:  Discharge Medication List as of 07/29/2017 10:43 PM    START taking these medications   Details  predniSONE (DELTASONE) 20 MG tablet 3 tabs po daily x 3 days, then 2 tabs x 3 days, then 1.5 tabs x 3 days, then 1 tab x 3 days, then 0.5 tabs x 3 days, Print        Note:  This document was prepared using Dragon voice recognition software and may include unintentional dictation errors.    Merrily Pew, MD 07/29/17 [phone]

## 2017-07-31 ENCOUNTER — Encounter: Payer: Self-pay | Admitting: Family Medicine

## 2017-07-31 ENCOUNTER — Ambulatory Visit (INDEPENDENT_AMBULATORY_CARE_PROVIDER_SITE_OTHER): Payer: Medicaid Other | Admitting: Family Medicine

## 2017-07-31 VITALS — BP 114/77 | HR 97 | Temp 97.3°F | Ht 70.0 in | Wt 388.0 lb

## 2017-07-31 DIAGNOSIS — N4281 Prostatodynia syndrome: Secondary | ICD-10-CM

## 2017-07-31 DIAGNOSIS — M5416 Radiculopathy, lumbar region: Secondary | ICD-10-CM | POA: Diagnosis not present

## 2017-07-31 MED ORDER — CIPROFLOXACIN HCL 500 MG PO TABS: 500.0000 mg | ORAL_TABLET | Freq: Two times a day (BID) | ORAL | 0 refills | Status: DC

## 2017-07-31 NOTE — Progress Notes (Signed)
Subjective:  Patient ID: [name], male    DOB: Jul 05, 1986  Age: 31 y.o. MRN: [id number]  CC: Hospitalization Follow-up (pt here today following up after going to ED for testicular pain and needs referral for urologist )   HPI [name] presents for Onset of testicular pain about 1-1/2 weeks ago. Radiating into the perineum. ER report reviewed showing that some goes into his back per their report. However he also has a history of herniated disc. He has significant back pain that has been ongoing as well and would like to have a referral back to his neurosurgeon due to CT findings in the ER. That report is reviewed as attached and imaging section. Of note is that he also had imaging of the testicles and scrotum that was essentially normal. Additionally he had a CT of the abdomen and pelvis that was normal including prostate. He does have constant pain that has continued to worsen. He started on a prednisone pack a couple of days ago and has gotten no relief so far.  Depression screen [facility] 2/9 07/31/2017 07/15/2017 04/09/2017  Decreased Interest 0 0 0  Down, Depressed, Hopeless 0 0 0  PHQ - 2 Score 0 0 0  Altered sleeping - - -  Tired, decreased energy - - -  Change in appetite - - -  Feeling bad or failure about yourself  - - -  Trouble concentrating - - -  Moving slowly or fidgety/restless - - -  Suicidal thoughts - - -  PHQ-9 Score - - -    History [name] has a past medical history of ADHD (attention deficit hyperactivity disorder); Anxiety; Bipolar disorder ([place]); Chronic lower back pain; Depression; Esophagitis; Gastric ulcer; GERD (gastroesophageal reflux disease); Hiatal hernia; History of stomach ulcers; MRSA infection (8/08); Hypercholesteremia; Obesity; Occult GI bleeding (12/2008); OSA on CPAP; Pneumonia (09/01/2015); S/P colonoscopy (11/10, 10/08); Schizophrenia ([place]); Thyroid function test abnormal; and Tobacco dipper.   He has a past surgical history that includes  Vasectomy; Ankle fracture surgery (Left, ~ 2008); Small bowel capsule (04/11/2009); Esophagogastroduodenoscopy ( 04/07/2009); Esophagogastroduodenoscopy (01/09/2009); ileocolonoscopy (08/26/2007); Esophagogastroduodenoscopy (08/26/2007); Colonoscopy (10/10/2009); Back surgery; Lumbar microdiscectomy (08/21/2015); Incision and drainage (09/04/2015); Fracture surgery; and Lumbar wound debridement (N/A, 09/04/2015).   His family history includes ADD / ADHD in his brother; Bipolar disorder in his brother; Leukemia (age of onset: 35) in his father; Seizures in his mother.He reports that he has never smoked. His smokeless tobacco use includes Snuff. He reports that he does not drink alcohol or use drugs.    ROS Review of Systems  Constitutional: Negative for chills, diaphoresis and fever.  HENT: Negative for rhinorrhea and sore throat.   Respiratory: Negative for cough and shortness of breath.   Cardiovascular: Negative for chest pain.  Gastrointestinal: Negative for abdominal pain.  Genitourinary: Positive for difficulty urinating, dysuria and testicular pain. Negative for decreased urine volume, penile pain, scrotal swelling and urgency.  Musculoskeletal: Negative for arthralgias and myalgias.  Skin: Negative for rash.  Neurological: Negative for weakness and headaches.    Objective:  BP 114/77   Pulse 97   Temp (!) 97.3 F (36.3 C) (Oral)   Ht 5\' 10"  (1.778 m)   Wt (!) 388 lb (176 kg)   BMI 55.67 kg/m   BP Readings from Last 3 Encounters:  07/31/17 114/77  07/29/17 105/65  07/27/17 (!) 99/54    Wt Readings from Last 3 Encounters:  07/31/17 (!) 388 lb (176 kg)  07/29/17 (!) 389 lb (  176.4 kg)  07/27/17 (!) 389 lb (176.4 kg)     Physical Exam  Constitutional: He is oriented to person, place, and time. He appears well-developed and well-nourished.  HENT:  Head: Normocephalic and atraumatic.  Right Ear: External ear normal.  Left Ear: External ear normal.  Mouth/Throat: No  oropharyngeal exudate or posterior oropharyngeal erythema.  Eyes: Pupils are equal, round, and reactive to light.  Neck: Normal range of motion. Neck supple.  Cardiovascular: Normal rate and regular rhythm.   No murmur heard. Pulmonary/Chest: Breath sounds normal. No respiratory distress.  Neurological: He is alert and oriented to person, place, and time.  Vitals reviewed.  Genitourinary exam was not repeated since condition is stable and he's had multiple exams and studies.   Assessment & Plan:   [name] was seen today for hospitalization follow-up.  Diagnoses and all orders for this visit:  Lumbar radiculopathy -     Ambulatory referral to Neurosurgery  Prostadynia -     Ambulatory referral to Urology  Other orders -     ciprofloxacin (CIPRO) 500 MG tablet; Take 1 tablet (500 mg total) by mouth 2 (two) times daily.       I am having [name] start on ciprofloxacin. I am also having him maintain his traZODone, EPINEPHrine, doxepin, lamoTRIgine, albuterol, hydrOXYzine, diphenhydrAMINE, pantoprazole, oxyCODONE, cyclobenzaprine, cetirizine, lisdexamfetamine, docusate sodium, predniSONE, and gabapentin.  Allergies as of 07/31/2017      Reactions   Ibuprofen Other (See Comments)   Makes ulcers bleed   Influenza Vaccines Shortness Of Breath   Rash and unable to breathe well   Tylenol [acetaminophen] Other (See Comments)   Bleeding ulcers   Bee Venom Swelling   Tramadol Itching      Medication List       Accurate as of 07/31/17  5:33 PM. Always use your most recent med list.          albuterol 108 (90 Base) MCG/ACT inhaler Commonly known as:  PROVENTIL HFA;VENTOLIN HFA Inhale 1-2 puffs into the lungs every 6 (six) hours as needed for wheezing or shortness of breath.   cetirizine 10 MG tablet Commonly known as:  ZYRTEC Take 10 mg by mouth at bedtime.   ciprofloxacin 500 MG tablet Commonly known as:  CIPRO Take 1 tablet (500 mg total) by mouth 2 (two) times  daily.   cyclobenzaprine 10 MG tablet Commonly known as:  FLEXERIL Take 1 tablet (10 mg total) by mouth 3 (three) times daily as needed for muscle spasms.   diphenhydrAMINE 25 mg capsule Commonly known as:  BENADRYL Take 50 mg by mouth once as needed for allergies.   docusate sodium 100 MG capsule Commonly known as:  COLACE Take 100 mg by mouth every other day. Bedtime   doxepin 25 MG capsule Commonly known as:  SINEQUAN Take 25 mg by mouth at bedtime.   EPINEPHrine 0.3 mg/0.3 mL Soaj injection Commonly known as:  EPI-PEN Inject 0.3 mg into the muscle once.   gabapentin 300 MG capsule Commonly known as:  NEURONTIN Take 2 capsules (600 mg total) by mouth 3 (three) times daily.   hydrOXYzine 25 MG tablet Commonly known as:  ATARAX/VISTARIL Take 1-2 tablets (25-50 mg total) by mouth every 6 (six) hours as needed for itching (may cause drowsiness; do not take Zyrtec with this).   lamoTRIgine 100 MG tablet Commonly known as:  LAMICTAL Take 1 tablet (100 mg total) by mouth daily.   lisdexamfetamine 60 MG capsule Commonly known as:  VYVANSE  Take 60 mg by mouth every morning.   oxyCODONE 5 MG immediate release tablet Commonly known as:  ROXICODONE Take 1 tablet (5 mg total) by mouth every 6 (six) hours as needed for severe pain.   pantoprazole 40 MG tablet Commonly known as:  PROTONIX TAKE ONE TABLET BY MOUTH TWICE DAILY   predniSONE 20 MG tablet Commonly known as:  DELTASONE 3 tabs po daily x 3 days, then 2 tabs x 3 days, then 1.5 tabs x 3 days, then 1 tab x 3 days, then 0.5 tabs x 3 days   traZODone 50 MG tablet Commonly known as:  DESYREL Take 50 mg by mouth at bedtime.            Discharge Care Instructions        Start     Ordered   07/31/17 0000  Ambulatory referral to Neurosurgery    Comments:  Dr Trenton Gammon   07/31/17 1602   07/31/17 0000  Ambulatory referral to Urology    Comments:  [facility]   07/31/17 1603   07/31/17 0000  ciprofloxacin  (CIPRO) 500 MG tablet  2 times daily     07/31/17 1605       Follow-up: Return if symptoms worsen or fail to improve.  Claretta Fraise, M.D.

## 2017-08-19 ENCOUNTER — Ambulatory Visit (INDEPENDENT_AMBULATORY_CARE_PROVIDER_SITE_OTHER): Payer: Medicaid Other | Admitting: Urology

## 2017-08-19 DIAGNOSIS — M545 Low back pain: Secondary | ICD-10-CM

## 2017-08-19 DIAGNOSIS — N50812 Left testicular pain: Secondary | ICD-10-CM

## 2017-08-24 ENCOUNTER — Emergency Department (HOSPITAL_COMMUNITY)
Admission: EM | Admit: 2017-08-24 | Discharge: 2017-08-24 | Disposition: A | Discharge status: Home / Self Care | Payer: Medicaid Other | Attending: Emergency Medicine | Admitting: Emergency Medicine

## 2017-08-24 ENCOUNTER — Encounter (HOSPITAL_COMMUNITY): Payer: Self-pay | Admitting: *Deleted

## 2017-08-24 DIAGNOSIS — F17220 Nicotine dependence, chewing tobacco, uncomplicated: Secondary | ICD-10-CM | POA: Diagnosis not present

## 2017-08-24 DIAGNOSIS — R220 Localized swelling, mass and lump, head: Secondary | ICD-10-CM | POA: Diagnosis not present

## 2017-08-24 DIAGNOSIS — Z79899 Other long term (current) drug therapy: Secondary | ICD-10-CM | POA: Diagnosis not present

## 2017-08-24 DIAGNOSIS — R21 Rash and other nonspecific skin eruption: Secondary | ICD-10-CM | POA: Diagnosis present

## 2017-08-24 DIAGNOSIS — L0291 Cutaneous abscess, unspecified: Secondary | ICD-10-CM

## 2017-08-24 MED ORDER — MUPIROCIN CALCIUM 2 % EX CREA: 1.0000 "application " | TOPICAL_CREAM | Freq: Two times a day (BID) | CUTANEOUS | 0 refills | Status: DC

## 2017-08-24 NOTE — Discharge Instructions (Signed)
Warm soaks daily, hot compresses twice a day, apply topical antibody ointment, keep this covered with sterile dressing or a Band-Aid  Seek medical attention for severe or worsening swelling pain or fevers or spreading redness. Please do not squeeze on this area anymore.  Mupirocin twice daily

## 2017-08-24 NOTE — ED Notes (Signed)
Pt alert & oriented x4, stable gait. Patient given discharge instructions, paperwork & prescription(s). Patient  instructed to stop at the registration desk to finish any additional paperwork. Patient verbalized understanding. Pt left department w/ no further questions. 

## 2017-08-24 NOTE — ED Triage Notes (Signed)
Pt c/o ?abcess to right side of cheek area that started a few days ago, denies any drainage, denies any fever or chills,

## 2017-08-24 NOTE — ED Provider Notes (Signed)
[facility] DEPT Provider Note   CSN: [id number] Arrival date & time: 08/24/17  2017     History   Chief Complaint Chief Complaint  Patient presents with  . Abscess    HPI [name] is a 31 y.o. male.  HPI  The patient has had a rash on his face for several days, he decided to try to squeeze on this to get pus out but there is no pus, he now has a small module under the skin with a small red area on the skin. No fevers, no spreading redness, no difficulty opening his mouth.  Past Medical History:  Diagnosis Date  . ADHD (attention deficit hyperactivity disorder)   . Anxiety   . Bipolar disorder ([place])   . Chronic lower back pain   . Depression   . Esophagitis    Distal esophageal erosions consistent with mild erosive  reflux esophagitis 12/2008 by EGD   . Gastric ulcer   . GERD (gastroesophageal reflux disease)   . Hiatal hernia   . History of stomach ulcers   . Hx MRSA infection 8/08   right thigh  . Hypercholesteremia   . Obesity   . Occult GI bleeding 12/2008   Trivial upper GI bleed/uncontrolled GERD by upper endoscopy 12/2008, normal f/u endoscopy 03/2009 with SBCE at that time  . OSA on CPAP   . Pneumonia 09/01/2015   "on ATB still" (09/04/2015)  . S/P colonoscopy 11/10, 10/08   Dr [name]  . Schizophrenia [facility])   . Thyroid function test abnormal    Noted in 2011 discharge  . Tobacco dipper     Patient Active Problem List   Diagnosis Date Noted  . Wound drainage 09/04/2015  . Seasonal and perennial allergic rhinitis 07/23/2014  . Abdominal mass of other site 06/15/2012  . Abdominal cramps 06/15/2012  . Obstructive sleep apnea 05/03/2012  . Syncope and collapse 05/03/2012  . Chest pain 05/03/2012  . Obesity, morbid ([place]) 05/03/2012  . Anal fissure 05/27/2011  . UNSPECIFIED ANEMIA 09/11/2009  . Blood in stool 04/06/2009  . ABDOMINAL PAIN OTHER SPECIFIED SITE 03/07/2009  . BIPOLAR DISORDER UNSPECIFIED 03/06/2009  . SMOKELESS TOBACCO  ABUSE 03/06/2009  . ATTENTION DEFICIT HYPERACTIVITY DISORDER 03/06/2009  . GERD 03/06/2009  . NAUSEA AND VOMITING 03/06/2009  . DIARRHEA 03/06/2009    Past Surgical History:  Procedure Laterality Date  . ANKLE FRACTURE SURGERY Left ~ 2008  . BACK SURGERY    . COLONOSCOPY  10/10/2009   anal papilla otherwise normal  . ESOPHAGOGASTRODUODENOSCOPY   04/07/2009   Normal esophagus, small hiatal hernia  . ESOPHAGOGASTRODUODENOSCOPY  01/09/2009   Distal esophageal erosions consistent with mild erosive reflux esophagitis, otherwise normal esophagus, small hiatal herniaotherwise normal stomach, D1-D2   . ESOPHAGOGASTRODUODENOSCOPY  08/26/2007   Normal esophagus, a small hiatal/hernia, otherwise normal stomach D1 through D3  . FRACTURE SURGERY    . ileocolonoscopy  08/26/2007    A normal rectum, colon, and terminal ileum  . INCISION AND DRAINAGE  09/04/2015   "reopened my back incsion"  . LUMBAR MICRODISCECTOMY  08/21/2015  . LUMBAR WOUND DEBRIDEMENT N/A 09/04/2015   Procedure: LUMBAR WOUND DEBRIDEMENT;  Surgeon: Consuella Lose, MD;  Location: [facility];  Service: Neurosurgery;  Laterality: N/A;  . Small bowel capsule  04/11/2009    normal throughout  . VASECTOMY         Home Medications    Prior to Admission medications   Medication Sig Start Date End Date Taking? Authorizing Provider  albuterol (PROVENTIL HFA;VENTOLIN HFA) 108 (90 Base) MCG/ACT inhaler Inhale 1-2 puffs into the lungs every 6 (six) hours as needed for wheezing or shortness of breath. 11/26/16  Yes Doristine Devoid, PA-C  cetirizine (ZYRTEC) 10 MG tablet Take 10 mg by mouth at bedtime.   Yes [provider]  cyclobenzaprine (FLEXERIL) 10 MG tablet Take 1 tablet (10 mg total) by mouth 3 (three) times daily as needed for muscle spasms. 07/27/17  Yes Milton Ferguson, MD  diphenhydrAMINE (BENADRYL) 25 mg capsule Take 50 mg by mouth once as needed for allergies.   Yes [provider]  docusate sodium  (COLACE) 100 MG capsule Take 100 mg by mouth every other day. Bedtime   Yes [provider]  doxepin (SINEQUAN) 25 MG capsule Take 25 mg by mouth at bedtime.    Yes [provider]  gabapentin (NEURONTIN) 300 MG capsule Take 2 capsules (600 mg total) by mouth 3 (three) times daily. 07/29/17  Yes Mesner, Corene Cornea, MD  hydrOXYzine (ATARAX/VISTARIL) 25 MG tablet Take 1-2 tablets (25-50 mg total) by mouth every 6 (six) hours as needed for itching (may cause drowsiness; do not take Zyrtec with this). 06/07/17  Yes Molpus, John, MD  lamoTRIgine (LAMICTAL) 100 MG tablet Take 1 tablet (100 mg total) by mouth daily. Patient taking differently: Take 200 mg by mouth daily.  09/24/16  Yes Stacks, Cletus Gash, MD  lisdexamfetamine (VYVANSE) 60 MG capsule Take 60 mg by mouth every morning.   Yes [provider]  oxyCODONE (ROXICODONE) 5 MG immediate release tablet Take 1 tablet (5 mg total) by mouth every 6 (six) hours as needed for severe pain. Patient taking differently: Take 10 mg by mouth every 6 (six) hours as needed for severe pain.  07/27/17  Yes Milton Ferguson, MD  pantoprazole (PROTONIX) 40 MG tablet TAKE ONE TABLET BY MOUTH TWICE DAILY 07/23/17  Yes Claretta Fraise, MD  traZODone (DESYREL) 50 MG tablet Take 50 mg by mouth at bedtime.   Yes [provider]  EPINEPHrine 0.3 mg/0.3 mL IJ SOAJ injection Inject 0.3 mg into the muscle once.    [provider]  mupirocin cream (BACTROBAN) 2 % Apply 1 application topically 2 (two) times daily. 08/24/17   Noemi Chapel, MD    Family History Family History  Problem Relation Age of Onset  . Leukemia Father 103  . Seizures Mother   . Bipolar disorder Brother   . ADD / ADHD Brother   . Colon cancer Neg Hx     Social History Social History  Substance Use Topics  . Smoking status: Never Smoker  . Smokeless tobacco: Current User    Types: Snuff  . Alcohol use No     Allergies   Ibuprofen; Influenza vaccines; Tylenol  [acetaminophen]; Bee venom; and Tramadol   Review of Systems Review of Systems  Constitutional: Negative for fever.  Skin: Positive for rash.     Physical Exam Updated Vital Signs BP 127/69   Pulse 100   Temp 98.7 F (37.1 C) (Oral)   Resp 18   Ht 6' (1.829 m)   Wt (!) 163.3 kg (360 lb)   SpO2 97%   BMI 48.82 kg/m   Physical Exam  Constitutional: He appears well-developed and well-nourished. No distress.  HENT:  Head: Normocephalic and atraumatic.  Eyes: Conjunctivae are normal. Right eye exhibits no discharge. Left eye exhibits no discharge. No scleral icterus.  Cardiovascular: Normal rate and regular rhythm.   No murmur heard. Pulmonary/Chest: Effort normal  and breath sounds normal.  Musculoskeletal: He exhibits tenderness. He exhibits no edema.  Skin: Skin is warm and dry. He is not diaphoretic.  Tiny nodule to the right cheek, minimal overlying redness, approximately 1 mm, no surrounding swelling of the tissues, no induration, no fluctuance, no malocclusion, no trismus or torticollis.  Nursing note and vitals reviewed.    ED Treatments / Results  Labs (all labs ordered are listed, but only abnormal results are displayed) Labs Reviewed - No data to display   Radiology No results found.  Procedures Procedures (including critical care time)  Medications Ordered in ED Medications - No data to display   Initial Impression / Assessment and Plan / ED Course  I have reviewed the triage vital signs and the nursing notes.  Pertinent labs & imaging results that were available during my care of the patient were reviewed by me and considered in my medical decision making (see chart for details).     Well-appearing, no obvious abscess, topical mupirocin, advised the patient not to squeeze this area.  Final Clinical Impressions(s) / ED Diagnoses   Final diagnoses:  Facial mass  Abscess    New Prescriptions New Prescriptions   MUPIROCIN CREAM (BACTROBAN) 2  %    Apply 1 application topically 2 (two) times daily.     Noemi Chapel, MD 08/24/17 2116

## 2017-08-26 ENCOUNTER — Other Ambulatory Visit: Payer: Self-pay | Admitting: Neurosurgery

## 2017-08-26 DIAGNOSIS — M5416 Radiculopathy, lumbar region: Secondary | ICD-10-CM

## 2017-09-06 ENCOUNTER — Ambulatory Visit
Admission: RE | Admit: 2017-09-06 | Discharge: 2017-09-06 | Disposition: A | Discharge status: Home / Self Care | Payer: Medicaid Other | Source: Ambulatory Visit | Attending: Neurosurgery | Admitting: Neurosurgery

## 2017-09-06 DIAGNOSIS — M5416 Radiculopathy, lumbar region: Secondary | ICD-10-CM

## 2017-09-06 MED ORDER — GADOBENATE DIMEGLUMINE 529 MG/ML IV SOLN
20.0000 mL | Freq: Once | INTRAVENOUS | Status: AC | PRN
  Administered 2017-09-06: 20 mL via INTRAVENOUS

## 2017-09-09 ENCOUNTER — Other Ambulatory Visit: Payer: Self-pay | Admitting: Neurosurgery

## 2017-09-15 NOTE — Pre-Procedure Instructions (Signed)
[name]  09/15/2017      [facility] [address], [place] [address] [place] [address] Phone: [phone] Fax: [phone]    Your procedure is scheduled on October 30  Report to [facility] at 0600 A.M.  Call this number if you have problems the morning of surgery:  [phone]   Remember:  Do not eat food or drink liquids after midnight.  Continue all other medications as directed by your physician except follow these medication instructions before surgery   Take these medicines the morning of surgery with A SIP OF WATER  albuterol (PROVENTIL HFA;VENTOLIN HFA)  gabapentin (NEURONTIN) Oxycodone HCl pantoprazole (PROTONIX  7 days prior to surgery STOP taking any Aspirin (unless otherwise instructed by your surgeon), Aleve, Naproxen, Ibuprofen, Motrin, Advil, Goody's, BC's, all herbal medications, fish oil, and all vitamins    Do not wear jewelry  Do not wear lotions, powders, or cologne, or deoderant.  Men may shave face and neck.  Do not bring valuables to the hospital.  [facility] is not responsible for any belongings or valuables.  Contacts, dentures or bridgework may not be worn into surgery.  Leave your suitcase in the car.  After surgery it may be brought to your room.  For patients admitted to the hospital, discharge time will be determined by your treatment team.  Patients discharged the day of surgery will not be allowed to drive home.    Special instructions:   [facility]- Preparing For Surgery  Before surgery, you can play an important role. Because skin is not sterile, your skin needs to be as free of germs as possible. You can reduce the number of germs on your skin by washing with CHG (chlorahexidine gluconate) Soap before surgery.  CHG is an antiseptic cleaner which kills germs and bonds with the skin to continue killing germs even after washing.  Please do not use if you have an  allergy to CHG or antibacterial soaps. If your skin becomes reddened/irritated stop using the CHG.  Do not shave (including legs and underarms) for at least 48 hours prior to first CHG shower. It is OK to shave your face.  Please follow these instructions carefully.   1. Shower the NIGHT BEFORE SURGERY and the MORNING OF SURGERY with CHG.   2. If you chose to wash your hair, wash your hair first as usual with your normal shampoo.  3. After you shampoo, rinse your hair and body thoroughly to remove the shampoo.  4. Use CHG as you would any other liquid soap. You can apply CHG directly to the skin and wash gently with a scrungie or a clean washcloth.   5. Apply the CHG Soap to your body ONLY FROM THE NECK DOWN.  Do not use on open wounds or open sores. Avoid contact with your eyes, ears, mouth and genitals (private parts). Wash Face and genitals (private parts)  with your normal soap.  6. Wash thoroughly, paying special attention to the area where your surgery will be performed.  7. Thoroughly rinse your body with warm water from the neck down.  8. DO NOT shower/wash with your normal soap after using and rinsing off the CHG Soap.  9. Pat yourself dry with a CLEAN TOWEL.  10. Wear CLEAN PAJAMAS to bed the night before surgery, wear comfortable clothes the morning of surgery  11. Place CLEAN SHEETS on your bed the night of  your first shower and DO NOT SLEEP WITH PETS.    Day of Surgery: Do not apply any deodorants/lotions. Please wear clean clothes to the hospital/surgery center.      Please read over the following fact sheets that you were given.

## 2017-09-16 ENCOUNTER — Encounter (HOSPITAL_COMMUNITY)
Admission: RE | Admit: 2017-09-16 | Discharge: 2017-09-16 | Disposition: A | Discharge status: Home / Self Care | Payer: Medicaid Other | Source: Ambulatory Visit | Attending: Neurosurgery | Admitting: Neurosurgery

## 2017-09-16 ENCOUNTER — Encounter (HOSPITAL_COMMUNITY): Payer: Self-pay

## 2017-09-16 DIAGNOSIS — G4733 Obstructive sleep apnea (adult) (pediatric): Secondary | ICD-10-CM | POA: Diagnosis not present

## 2017-09-16 DIAGNOSIS — K449 Diaphragmatic hernia without obstruction or gangrene: Secondary | ICD-10-CM | POA: Diagnosis not present

## 2017-09-16 DIAGNOSIS — K219 Gastro-esophageal reflux disease without esophagitis: Secondary | ICD-10-CM | POA: Diagnosis not present

## 2017-09-16 DIAGNOSIS — F419 Anxiety disorder, unspecified: Secondary | ICD-10-CM | POA: Diagnosis not present

## 2017-09-16 DIAGNOSIS — Z01812 Encounter for preprocedural laboratory examination: Secondary | ICD-10-CM | POA: Diagnosis not present

## 2017-09-16 DIAGNOSIS — F329 Major depressive disorder, single episode, unspecified: Secondary | ICD-10-CM | POA: Diagnosis not present

## 2017-09-16 DIAGNOSIS — M5186 Other intervertebral disc disorders, lumbar region: Secondary | ICD-10-CM | POA: Diagnosis not present

## 2017-09-16 LAB — CBC WITH DIFFERENTIAL/PLATELET
BASOS ABS: 0 10*3/uL (ref 0.0–0.1)
Basophils Relative: 1 %
EOS ABS: 0.2 10*3/uL (ref 0.0–0.7)
EOS PCT: 2 %
HCT: 44.2 % (ref 39.0–52.0)
Hemoglobin: 14.5 g/dL (ref 13.0–17.0)
LYMPHS ABS: 2.1 10*3/uL (ref 0.7–4.0)
Lymphocytes Relative: 29 %
MCH: 27.8 pg (ref 26.0–34.0)
MCHC: 32.8 g/dL (ref 30.0–36.0)
MCV: 84.8 fL (ref 78.0–100.0)
Monocytes Absolute: 0.7 10*3/uL (ref 0.1–1.0)
Monocytes Relative: 10 %
Neutro Abs: 4.3 10*3/uL (ref 1.7–7.7)
Neutrophils Relative %: 58 %
Platelets: 258 10*3/uL (ref 150–400)
RBC: 5.21 MIL/uL (ref 4.22–5.81)
RDW: 14.7 % (ref 11.5–15.5)
WBC: 7.2 10*3/uL (ref 4.0–10.5)

## 2017-09-16 LAB — BASIC METABOLIC PANEL
Anion gap: 7 (ref 5–15)
BUN: 9 mg/dL (ref 6–20)
CO2: 26 mmol/L (ref 22–32)
CREATININE: 1.06 mg/dL (ref 0.61–1.24)
Calcium: 9.4 mg/dL (ref 8.9–10.3)
Chloride: 105 mmol/L (ref 101–111)
GFR calc Af Amer: >60 mL/min (ref >60)
Glucose, Bld: 85 mg/dL (ref 65–99)
Potassium: 4.4 mmol/L (ref 3.5–5.1)
SODIUM: 138 mmol/L (ref 135–145)

## 2017-09-16 LAB — SURGICAL PCR SCREEN
MRSA, PCR: NEGATIVE
STAPHYLOCOCCUS AUREUS: POSITIVE — AB

## 2017-09-16 NOTE — Progress Notes (Signed)
prescription called in at [facility] in [place], patient notified and verbalized understanding

## 2017-09-16 NOTE — Progress Notes (Signed)
PCP - warren Stacks Cardiologist - denies  Chest x-ray - not needed EKG - 06/25/16 - normal sinus - no heart history Stress Test - he stated he has one at [facility] in 2016 ECHO - 2013 Cardiac Cath - denies  Sleep Study - 2018 CPAP - hasnt wore in 3 months instructed patient to start using it and bring it the day of surgery  Sending to anesthesia for review   Patient denies shortness of breath, fever, cough and chest pain at PAT appointment   Patient verbalized understanding of instructions that were given to them at the PAT appointment. Patient was also instructed that they will need to review over the PAT instructions again at home before surgery.

## 2017-09-18 NOTE — Progress Notes (Addendum)
Anesthesia Chart Review: Patient is a 31 year old male scheduled for left L4-5 microdiscectomy on 09/23/17 by Dr. Earnie Larsson.  History include non-smoker, tobacco dipper, Schizophrenia, depression, ADHD, Bipolar disorder, anxiety,  GERD, hiatal hernia, esophagitis, gastric ulcer, OSA (CPAP), chronic back pain, MRSA (right thigh) '08, abnormal thyroid function test '11 (normal 08/19/13), left L5-S1 microdiscectomy complicated by wound infection s/p operative debridement 09/04/15. BMI is consistent with morbid obesity.    - PCP is Dr. Claretta Fraise. - He is not routinely followed by a cardiologist, but saw Dr. Thompson Grayer 04/2012 for syncope and had a normal echo, carotid U/S, and ETT. More recently he saw Dr. Candee Furbish on 06/25/16 following ED evaluation [facility]) for atypical chest pain. He felt ED work-up was reassuring and pain was musculoskeletal in nature. No additional cardiac testing recommended. PRN cardiology follow-up.  Meds include albuterol, Zyrtec, Flexeril, Benadryl, doxepin, Neurontin, Lamictal, Vyvanse, oxycodone, Protonix.   BP 136/75   Pulse 89   Temp (!) 36.4 C   Resp 20   Ht 6' (1.829 m)   Wt (!) 401 lb 8 oz (182.1 kg)   SpO2 97%   BMI 54.45 kg/m   EKG 06/25/16 ([facility]): NSR, insignificant Q waves in V4-6 and inferior leads.  Echo 06/20/16 [facility]): Conclusions: The study quality is fair. The study is technically limited due to body habitus. The left ventricular chamber size is normal. Global left ventricular wall motion and contractility are within normal limits. The estimated ejection fraction is greater than 65%. Normal left ventricular diastolic filling is observed. The left atrial chamber size is normal. Aortic valve structures normal. The mitral valve leaflets appear normal. RVSP is 18 mmHg.     ETT 05/21/12: Normal, no evidence of ischemia by ST analysis.  Carotid U/S 05/04/12: Summary: - No significant extracranial carotid  artery stenosis demonstrated. Vertebrals are patent with antegrade flow. - Technically difficult due to morbid obesity and thickness of the neck.  CTA Chest 06/16/16 [facility]): Impression: No evidence for PE. Mild dependent atelectasis bilaterally. No significant coronary artery disease is present.  Preoperative CBC and BMET WNL.   If no acute changes then I anticipate that he can proceed as planned.  [facility] Short Stay Center/Anesthesiology Phone [phone] 09/19/2017 1:58 PM

## 2017-09-22 MED ORDER — DEXTROSE 5 % IV SOLN
3.0000 g | INTRAVENOUS | Status: AC
  Administered 2017-09-23: 3 g via INTRAVENOUS
  Filled 2017-09-22 (×2): qty 3000

## 2017-09-22 MED ORDER — DEXAMETHASONE SODIUM PHOSPHATE 10 MG/ML IJ SOLN
10.0000 mg | INTRAMUSCULAR | Status: AC
  Administered 2017-09-23: 10 mg via INTRAVENOUS
  Filled 2017-09-22: qty 1

## 2017-09-23 ENCOUNTER — Encounter (HOSPITAL_COMMUNITY)
Admission: RE | Disposition: A | Discharge status: Home / Self Care | Payer: Self-pay | Source: Ambulatory Visit | Attending: Neurosurgery

## 2017-09-23 ENCOUNTER — Encounter (HOSPITAL_COMMUNITY): Payer: Self-pay | Admitting: Certified Registered Nurse Anesthetist

## 2017-09-23 ENCOUNTER — Ambulatory Visit (HOSPITAL_COMMUNITY): Payer: Medicaid Other

## 2017-09-23 ENCOUNTER — Observation Stay (HOSPITAL_COMMUNITY)
Admission: RE | Admit: 2017-09-23 | Discharge: 2017-09-24 | Disposition: A | Discharge status: Home / Self Care | Payer: Medicaid Other | Source: Ambulatory Visit | Attending: Neurosurgery | Admitting: Neurosurgery

## 2017-09-23 ENCOUNTER — Ambulatory Visit (HOSPITAL_COMMUNITY): Payer: Medicaid Other | Admitting: Emergency Medicine

## 2017-09-23 DIAGNOSIS — Z79899 Other long term (current) drug therapy: Secondary | ICD-10-CM | POA: Insufficient documentation

## 2017-09-23 DIAGNOSIS — F909 Attention-deficit hyperactivity disorder, unspecified type: Secondary | ICD-10-CM | POA: Diagnosis not present

## 2017-09-23 DIAGNOSIS — M5116 Intervertebral disc disorders with radiculopathy, lumbar region: Principal | ICD-10-CM | POA: Insufficient documentation

## 2017-09-23 DIAGNOSIS — Z720 Tobacco use: Secondary | ICD-10-CM | POA: Diagnosis not present

## 2017-09-23 DIAGNOSIS — F209 Schizophrenia, unspecified: Secondary | ICD-10-CM | POA: Diagnosis not present

## 2017-09-23 DIAGNOSIS — Z8719 Personal history of other diseases of the digestive system: Secondary | ICD-10-CM | POA: Diagnosis not present

## 2017-09-23 DIAGNOSIS — G4733 Obstructive sleep apnea (adult) (pediatric): Secondary | ICD-10-CM | POA: Diagnosis not present

## 2017-09-23 DIAGNOSIS — M5126 Other intervertebral disc displacement, lumbar region: Secondary | ICD-10-CM | POA: Diagnosis present

## 2017-09-23 DIAGNOSIS — F319 Bipolar disorder, unspecified: Secondary | ICD-10-CM | POA: Diagnosis not present

## 2017-09-23 DIAGNOSIS — K219 Gastro-esophageal reflux disease without esophagitis: Secondary | ICD-10-CM | POA: Diagnosis not present

## 2017-09-23 DIAGNOSIS — K449 Diaphragmatic hernia without obstruction or gangrene: Secondary | ICD-10-CM | POA: Insufficient documentation

## 2017-09-23 DIAGNOSIS — Z6841 Body Mass Index (BMI) 40.0 and over, adult: Secondary | ICD-10-CM | POA: Diagnosis not present

## 2017-09-23 DIAGNOSIS — Z419 Encounter for procedure for purposes other than remedying health state, unspecified: Secondary | ICD-10-CM

## 2017-09-23 DIAGNOSIS — F419 Anxiety disorder, unspecified: Secondary | ICD-10-CM | POA: Diagnosis not present

## 2017-09-23 DIAGNOSIS — E7800 Pure hypercholesterolemia, unspecified: Secondary | ICD-10-CM | POA: Insufficient documentation

## 2017-09-23 HISTORY — PX: LUMBAR MICRODISCECTOMY: SHX99

## 2017-09-23 HISTORY — PX: LUMBAR LAMINECTOMY/DECOMPRESSION MICRODISCECTOMY: SHX5026

## 2017-09-23 SURGERY — LUMBAR LAMINECTOMY/DECOMPRESSION MICRODISCECTOMY 1 LEVEL
Anesthesia: General | Site: Back | Laterality: Left

## 2017-09-23 MED ORDER — ONDANSETRON HCL 4 MG/2ML IJ SOLN
INTRAMUSCULAR | Status: AC
  Filled 2017-09-23: qty 2

## 2017-09-23 MED ORDER — ONDANSETRON HCL 4 MG/2ML IJ SOLN
INTRAMUSCULAR | Status: DC | PRN
  Administered 2017-09-23: 4 mg via INTRAVENOUS

## 2017-09-23 MED ORDER — SODIUM CHLORIDE 0.9% FLUSH: 3.0000 mL | INTRAVENOUS | Status: DC | PRN

## 2017-09-23 MED ORDER — FENTANYL CITRATE (PF) 100 MCG/2ML IJ SOLN
INTRAMUSCULAR | Status: DC | PRN
  Administered 2017-09-23: 100 ug via INTRAVENOUS
  Administered 2017-09-23: 50 ug via INTRAVENOUS
  Administered 2017-09-23: 100 ug via INTRAVENOUS
  Administered 2017-09-23 (×3): 50 ug via INTRAVENOUS

## 2017-09-23 MED ORDER — PROPOFOL 10 MG/ML IV BOLUS
INTRAVENOUS | Status: DC | PRN
  Administered 2017-09-23: 200 mg via INTRAVENOUS
  Administered 2017-09-23: 50 mg via INTRAVENOUS

## 2017-09-23 MED ORDER — FENTANYL CITRATE (PF) 100 MCG/2ML IJ SOLN
INTRAMUSCULAR | Status: AC
  Administered 2017-09-23: 50 ug via INTRAVENOUS
  Filled 2017-09-23: qty 2

## 2017-09-23 MED ORDER — PHENOL 1.4 % MT LIQD: 1.0000 | OROMUCOSAL | Status: DC | PRN

## 2017-09-23 MED ORDER — PROPOFOL 10 MG/ML IV BOLUS
INTRAVENOUS | Status: AC
  Filled 2017-09-23: qty 20

## 2017-09-23 MED ORDER — LISDEXAMFETAMINE DIMESYLATE 20 MG PO CAPS
60.0000 mg | ORAL_CAPSULE | Freq: Every morning | ORAL | Status: DC
  Administered 2017-09-24: 60 mg via ORAL
  Filled 2017-09-23: qty 3

## 2017-09-23 MED ORDER — SUGAMMADEX SODIUM 500 MG/5ML IV SOLN
INTRAVENOUS | Status: DC | PRN
  Administered 2017-09-23: 500 mg via INTRAVENOUS

## 2017-09-23 MED ORDER — DOXEPIN HCL 25 MG PO CAPS
25.0000 mg | ORAL_CAPSULE | Freq: Every day | ORAL | Status: DC
  Administered 2017-09-23: 25 mg via ORAL
  Filled 2017-09-23: qty 1

## 2017-09-23 MED ORDER — SUCCINYLCHOLINE CHLORIDE 200 MG/10ML IV SOSY
PREFILLED_SYRINGE | INTRAVENOUS | Status: AC
  Filled 2017-09-23: qty 10

## 2017-09-23 MED ORDER — KETOROLAC TROMETHAMINE 15 MG/ML IJ SOLN
30.0000 mg | Freq: Four times a day (QID) | INTRAMUSCULAR | Status: DC
  Administered 2017-09-23 – 2017-09-24 (×3): 30 mg via INTRAVENOUS
  Filled 2017-09-23 (×3): qty 2

## 2017-09-23 MED ORDER — CYCLOBENZAPRINE HCL 10 MG PO TABS
ORAL_TABLET | ORAL | Status: AC
  Administered 2017-09-23: 10 mg
  Filled 2017-09-23: qty 1

## 2017-09-23 MED ORDER — 0.9 % SODIUM CHLORIDE (POUR BTL) OPTIME
TOPICAL | Status: DC | PRN
  Administered 2017-09-23: 1000 mL

## 2017-09-23 MED ORDER — ONDANSETRON HCL 4 MG/2ML IJ SOLN: 4.0000 mg | Freq: Four times a day (QID) | INTRAMUSCULAR | Status: DC | PRN

## 2017-09-23 MED ORDER — GABAPENTIN 300 MG PO CAPS
600.0000 mg | ORAL_CAPSULE | Freq: Three times a day (TID) | ORAL | Status: DC
  Administered 2017-09-23 – 2017-09-24 (×4): 600 mg via ORAL
  Filled 2017-09-23 (×4): qty 2

## 2017-09-23 MED ORDER — FENTANYL CITRATE (PF) 250 MCG/5ML IJ SOLN
INTRAMUSCULAR | Status: AC
  Filled 2017-09-23: qty 5

## 2017-09-23 MED ORDER — ACETAMINOPHEN 325 MG PO TABS: 650.0000 mg | ORAL_TABLET | ORAL | Status: DC | PRN

## 2017-09-23 MED ORDER — VANCOMYCIN HCL 1000 MG IV SOLR
INTRAVENOUS | Status: AC
  Filled 2017-09-23: qty 1000

## 2017-09-23 MED ORDER — HYDROMORPHONE HCL 1 MG/ML IJ SOLN
1.0000 mg | INTRAMUSCULAR | Status: DC | PRN
  Administered 2017-09-23: 1 mg via INTRAVENOUS
  Filled 2017-09-23: qty 1

## 2017-09-23 MED ORDER — OXYCODONE HCL 5 MG PO TABS
ORAL_TABLET | ORAL | Status: AC
  Administered 2017-09-23: 10 mg via ORAL
  Filled 2017-09-23: qty 2

## 2017-09-23 MED ORDER — LAMOTRIGINE 100 MG PO TABS
200.0000 mg | ORAL_TABLET | Freq: Every day | ORAL | Status: DC
  Administered 2017-09-23: 200 mg via ORAL
  Filled 2017-09-23: qty 2

## 2017-09-23 MED ORDER — ROCURONIUM BROMIDE 100 MG/10ML IV SOLN
INTRAVENOUS | Status: DC | PRN
  Administered 2017-09-23: 20 mg via INTRAVENOUS
  Administered 2017-09-23: 50 mg via INTRAVENOUS

## 2017-09-23 MED ORDER — OXYCODONE HCL 5 MG PO TABS: 5.0000 mg | ORAL_TABLET | ORAL | Status: DC | PRN

## 2017-09-23 MED ORDER — ACETAMINOPHEN 650 MG RE SUPP: 650.0000 mg | RECTAL | Status: DC | PRN

## 2017-09-23 MED ORDER — DIPHENHYDRAMINE HCL 25 MG PO CAPS: 25.0000 mg | ORAL_CAPSULE | Freq: Three times a day (TID) | ORAL | Status: DC | PRN

## 2017-09-23 MED ORDER — OXYCODONE HCL 10 MG PO TABS: 10.0000 mg | ORAL_TABLET | Freq: Three times a day (TID) | ORAL | Status: DC | PRN

## 2017-09-23 MED ORDER — THROMBIN 5000 UNITS EX SOLR
CUTANEOUS | Status: DC | PRN
  Administered 2017-09-23 (×2): 5000 [IU] via TOPICAL

## 2017-09-23 MED ORDER — SODIUM CHLORIDE 0.9 % IR SOLN
Status: DC | PRN
  Administered 2017-09-23: 500 mL

## 2017-09-23 MED ORDER — LORATADINE 10 MG PO TABS
10.0000 mg | ORAL_TABLET | Freq: Every day | ORAL | Status: DC
  Administered 2017-09-23 – 2017-09-24 (×2): 10 mg via ORAL
  Filled 2017-09-23 (×2): qty 1

## 2017-09-23 MED ORDER — CYCLOBENZAPRINE HCL 10 MG PO TABS: 10.0000 mg | ORAL_TABLET | Freq: Three times a day (TID) | ORAL | Status: DC | PRN

## 2017-09-23 MED ORDER — SODIUM CHLORIDE 0.9 % IV SOLN: 250.0000 mL | INTRAVENOUS | Status: DC

## 2017-09-23 MED ORDER — ONDANSETRON HCL 4 MG PO TABS
4.0000 mg | ORAL_TABLET | Freq: Four times a day (QID) | ORAL | Status: DC | PRN
Start: 2017-09-23 — End: 2017-09-24

## 2017-09-23 MED ORDER — OXYCODONE HCL 5 MG/5ML PO SOLN: 5.0000 mg | Freq: Once | ORAL | Status: DC | PRN

## 2017-09-23 MED ORDER — MENTHOL 3 MG MT LOZG: 1.0000 | LOZENGE | OROMUCOSAL | Status: DC | PRN

## 2017-09-23 MED ORDER — BUPIVACAINE HCL (PF) 0.25 % IJ SOLN
INTRAMUSCULAR | Status: DC | PRN
  Administered 2017-09-23: 20 mL

## 2017-09-23 MED ORDER — CHLORHEXIDINE GLUCONATE CLOTH 2 % EX PADS: 6.0000 | MEDICATED_PAD | Freq: Once | CUTANEOUS | Status: DC

## 2017-09-23 MED ORDER — FENTANYL CITRATE (PF) 100 MCG/2ML IJ SOLN
25.0000 ug | INTRAMUSCULAR | Status: DC | PRN
  Administered 2017-09-23 (×2): 50 ug via INTRAVENOUS

## 2017-09-23 MED ORDER — ALBUTEROL SULFATE (2.5 MG/3ML) 0.083% IN NEBU: 2.5000 mg | INHALATION_SOLUTION | Freq: Four times a day (QID) | RESPIRATORY_TRACT | Status: DC | PRN

## 2017-09-23 MED ORDER — DOCUSATE SODIUM 100 MG PO CAPS: 100.0000 mg | ORAL_CAPSULE | ORAL | Status: DC

## 2017-09-23 MED ORDER — HEMOSTATIC AGENTS (NO CHARGE) OPTIME
TOPICAL | Status: DC | PRN
  Administered 2017-09-23: 1 via TOPICAL

## 2017-09-23 MED ORDER — VANCOMYCIN HCL 1000 MG IV SOLR
INTRAVENOUS | Status: DC | PRN
  Administered 2017-09-23: 1000 mg

## 2017-09-23 MED ORDER — OXYCODONE HCL 5 MG PO TABS
10.0000 mg | ORAL_TABLET | ORAL | Status: DC | PRN
  Administered 2017-09-23 – 2017-09-24 (×6): 10 mg via ORAL
  Filled 2017-09-23 (×5): qty 2

## 2017-09-23 MED ORDER — CEFAZOLIN SODIUM-DEXTROSE 1-4 GM/50ML-% IV SOLN
1.0000 g | Freq: Three times a day (TID) | INTRAVENOUS | Status: AC
  Administered 2017-09-23 (×2): 1 g via INTRAVENOUS
  Filled 2017-09-23 (×2): qty 50

## 2017-09-23 MED ORDER — LIDOCAINE HCL (CARDIAC) 20 MG/ML IV SOLN
INTRAVENOUS | Status: DC | PRN
  Administered 2017-09-23: 60 mg via INTRAVENOUS

## 2017-09-23 MED ORDER — MIDAZOLAM HCL 2 MG/2ML IJ SOLN
INTRAMUSCULAR | Status: AC
  Filled 2017-09-23: qty 2

## 2017-09-23 MED ORDER — BUPIVACAINE HCL (PF) 0.25 % IJ SOLN
INTRAMUSCULAR | Status: AC
  Filled 2017-09-23: qty 30

## 2017-09-23 MED ORDER — MIDAZOLAM HCL 5 MG/5ML IJ SOLN
INTRAMUSCULAR | Status: DC | PRN
  Administered 2017-09-23: 2 mg via INTRAVENOUS

## 2017-09-23 MED ORDER — EPHEDRINE 5 MG/ML INJ
INTRAVENOUS | Status: AC
  Filled 2017-09-23: qty 10

## 2017-09-23 MED ORDER — CYCLOBENZAPRINE HCL 10 MG PO TABS
10.0000 mg | ORAL_TABLET | Freq: Three times a day (TID) | ORAL | Status: DC | PRN
  Administered 2017-09-23 – 2017-09-24 (×2): 10 mg via ORAL
  Filled 2017-09-23 (×2): qty 1

## 2017-09-23 MED ORDER — SODIUM CHLORIDE 0.9% FLUSH
3.0000 mL | Freq: Two times a day (BID) | INTRAVENOUS | Status: DC
  Administered 2017-09-23: 3 mL via INTRAVENOUS

## 2017-09-23 MED ORDER — FENTANYL CITRATE (PF) 250 MCG/5ML IJ SOLN
INTRAMUSCULAR | Status: AC
Start: 2017-09-23 — End: 2017-09-23
  Filled 2017-09-23: qty 5

## 2017-09-23 MED ORDER — LACTATED RINGERS IV SOLN
INTRAVENOUS | Status: DC
  Administered 2017-09-23 (×2): via INTRAVENOUS

## 2017-09-23 MED ORDER — OXYCODONE HCL 5 MG PO TABS: 5.0000 mg | ORAL_TABLET | Freq: Once | ORAL | Status: DC | PRN

## 2017-09-23 MED ORDER — PANTOPRAZOLE SODIUM 40 MG PO TBEC
40.0000 mg | DELAYED_RELEASE_TABLET | Freq: Two times a day (BID) | ORAL | Status: DC
  Administered 2017-09-23 – 2017-09-24 (×2): 40 mg via ORAL
  Filled 2017-09-23 (×2): qty 1

## 2017-09-23 MED ORDER — KETOROLAC TROMETHAMINE 30 MG/ML IJ SOLN
INTRAMUSCULAR | Status: DC | PRN
  Administered 2017-09-23: 30 mg via INTRAVENOUS

## 2017-09-23 MED ORDER — THROMBIN (RECOMBINANT) 5000 UNITS EX SOLR
CUTANEOUS | Status: AC
  Filled 2017-09-23: qty 10000

## 2017-09-23 SURGICAL SUPPLY — 59 items
ADH SKN CLS APL DERMABOND .7 (GAUZE/BANDAGES/DRESSINGS) ×1
APL SKNCLS STERI-STRIP NONHPOA (GAUZE/BANDAGES/DRESSINGS) ×1
BAG DECANTER FOR FLEXI CONT (MISCELLANEOUS) ×2 IMPLANT
BENZOIN TINCTURE PRP APPL 2/3 (GAUZE/BANDAGES/DRESSINGS) ×2 IMPLANT
BLADE CLIPPER SURG (BLADE) IMPLANT
BUR CUTTER 7.0 ROUND (BURR) ×2 IMPLANT
CANISTER SUCT 3000ML PPV (MISCELLANEOUS) ×2 IMPLANT
CARTRIDGE OIL MAESTRO DRILL (MISCELLANEOUS) ×1 IMPLANT
CLSR STERI-STRIP ANTIMIC 1/2X4 (GAUZE/BANDAGES/DRESSINGS) ×1 IMPLANT
DECANTER SPIKE VIAL GLASS SM (MISCELLANEOUS) ×2 IMPLANT
DERMABOND ADVANCED (GAUZE/BANDAGES/DRESSINGS) ×1
DERMABOND ADVANCED .7 DNX12 (GAUZE/BANDAGES/DRESSINGS) ×1 IMPLANT
DIFFUSER DRILL AIR PNEUMATIC (MISCELLANEOUS) ×2 IMPLANT
DRAPE HALF SHEET 40X57 (DRAPES) ×1 IMPLANT
DRAPE LAPAROTOMY 100X72X124 (DRAPES) ×2 IMPLANT
DRAPE MICROSCOPE LEICA (MISCELLANEOUS) ×2 IMPLANT
DRAPE POUCH INSTRU U-SHP 10X18 (DRAPES) ×2 IMPLANT
DRAPE SURG 17X23 STRL (DRAPES) ×4 IMPLANT
DRSG OPSITE POSTOP 4X6 (GAUZE/BANDAGES/DRESSINGS) ×1 IMPLANT
DURAPREP 26ML APPLICATOR (WOUND CARE) ×2 IMPLANT
ELECT REM PT RETURN 9FT ADLT (ELECTROSURGICAL) ×2
ELECTRODE REM PT RTRN 9FT ADLT (ELECTROSURGICAL) ×1 IMPLANT
GAUZE SPONGE 4X4 12PLY STRL (GAUZE/BANDAGES/DRESSINGS) ×1 IMPLANT
GAUZE SPONGE 4X4 16PLY XRAY LF (GAUZE/BANDAGES/DRESSINGS) ×1 IMPLANT
GLOVE BIOGEL PI IND STRL 6.5 (GLOVE) IMPLANT
GLOVE BIOGEL PI IND STRL 8 (GLOVE) IMPLANT
GLOVE BIOGEL PI IND STRL 8.5 (GLOVE) IMPLANT
GLOVE BIOGEL PI INDICATOR 6.5 (GLOVE) ×1
GLOVE BIOGEL PI INDICATOR 8 (GLOVE) ×1
GLOVE BIOGEL PI INDICATOR 8.5 (GLOVE) ×1
GLOVE ECLIPSE 9.0 STRL (GLOVE) ×2 IMPLANT
GLOVE EXAM NITRILE LRG STRL (GLOVE) IMPLANT
GLOVE EXAM NITRILE XL STR (GLOVE) IMPLANT
GLOVE EXAM NITRILE XS STR PU (GLOVE) IMPLANT
GLOVE SURG SS PI 6.5 STRL IVOR (GLOVE) ×1 IMPLANT
GLOVE SURG SS PI 8.5 STRL IVOR (GLOVE) ×1
GLOVE SURG SS PI 8.5 STRL STRW (GLOVE) IMPLANT
GOWN STRL REUS W/ TWL LRG LVL3 (GOWN DISPOSABLE) IMPLANT
GOWN STRL REUS W/ TWL XL LVL3 (GOWN DISPOSABLE) ×1 IMPLANT
GOWN STRL REUS W/TWL 2XL LVL3 (GOWN DISPOSABLE) ×1 IMPLANT
GOWN STRL REUS W/TWL LRG LVL3 (GOWN DISPOSABLE) ×2
GOWN STRL REUS W/TWL XL LVL3 (GOWN DISPOSABLE) ×2
KIT BASIN OR (CUSTOM PROCEDURE TRAY) ×2 IMPLANT
KIT ROOM TURNOVER OR (KITS) ×2 IMPLANT
NDL SPNL 22GX3.5 QUINCKE BK (NEEDLE) ×1 IMPLANT
NEEDLE HYPO 22GX1.5 SAFETY (NEEDLE) ×2 IMPLANT
NEEDLE SPNL 22GX3.5 QUINCKE BK (NEEDLE) ×2 IMPLANT
NS IRRIG 1000ML POUR BTL (IV SOLUTION) ×2 IMPLANT
OIL CARTRIDGE MAESTRO DRILL (MISCELLANEOUS) ×2
PACK LAMINECTOMY NEURO (CUSTOM PROCEDURE TRAY) ×2 IMPLANT
PAD ARMBOARD 7.5X6 YLW CONV (MISCELLANEOUS) ×6 IMPLANT
RUBBERBAND STERILE (MISCELLANEOUS) ×4 IMPLANT
SPONGE SURGIFOAM ABS GEL SZ50 (HEMOSTASIS) ×2 IMPLANT
STRIP CLOSURE SKIN 1/2X4 (GAUZE/BANDAGES/DRESSINGS) ×2 IMPLANT
SUT VIC AB 2-0 CT1 18 (SUTURE) ×2 IMPLANT
SUT VIC AB 3-0 SH 8-18 (SUTURE) ×2 IMPLANT
TOWEL GREEN STERILE (TOWEL DISPOSABLE) ×2 IMPLANT
TOWEL GREEN STERILE FF (TOWEL DISPOSABLE) ×2 IMPLANT
WATER STERILE IRR 1000ML POUR (IV SOLUTION) ×2 IMPLANT

## 2017-09-23 NOTE — Transfer of Care (Signed)
Immediate Anesthesia Transfer of Care Note  Patient: [name]  Procedure(s) Performed: Microdiscectomy - left - Lumbar four-Lumbar five (Left Back)  Patient Location: PACU  Anesthesia Type:General  Level of Consciousness: drowsy and patient cooperative  Airway & Oxygen Therapy: Patient Spontanous Breathing and Patient connected to nasal cannula oxygen  Post-op Assessment: Report given to RN, Post -op Vital signs reviewed and stable and Patient moving all extremities X 4  Post vital signs: Reviewed and stable  Last Vitals:  Vitals:   09/23/17 0632  BP: 126/67  Pulse: 81  Resp: 20  Temp: 36.5 C  SpO2: 98%    Last Pain:  Vitals:   09/23/17 0657  TempSrc:   PainSc: 5          Complications: No apparent anesthesia complications

## 2017-09-23 NOTE — Brief Op Note (Signed)
09/23/2017  9:36 AM  PATIENT:  [name]  31 y.o. male  PRE-OPERATIVE DIAGNOSIS:  Stenosis  POST-OPERATIVE DIAGNOSIS:  Stenosis  PROCEDURE:  Procedure(s): Microdiscectomy - left - Lumbar four-Lumbar five (Left)  SURGEON:  Surgeon(s) and Role:    * Earnie Larsson, MD - Primary    * Ditty, Kevan Ny, MD - Assisting  PHYSICIAN ASSISTANT:   ASSISTANTS:    ANESTHESIA:   general  EBL:  100 mL   BLOOD ADMINISTERED:none  DRAINS: none   LOCAL MEDICATIONS USED:  MARCAINE     SPECIMEN:  No Specimen  DISPOSITION OF SPECIMEN:  N/A  COUNTS:  YES  TOURNIQUET:  * No tourniquets in log *  DICTATION: .Dragon Dictation  PLAN OF CARE: Admit for overnight observation  PATIENT DISPOSITION:  PACU - hemodynamically stable.   Delay start of Pharmacological VTE agent (>24hrs) due to surgical blood loss or risk of bleeding: yes

## 2017-09-23 NOTE — Anesthesia Procedure Notes (Addendum)
Procedure Name: Intubation Date/Time: 09/23/2017 8:00 AM Performed by: Carney Living Pre-anesthesia Checklist: Patient identified, Emergency Drugs available, Suction available and Patient being monitored Patient Re-evaluated:Patient Re-evaluated prior to induction Oxygen Delivery Method: Circle System Utilized Preoxygenation: Pre-oxygenation with 100% oxygen Induction Type: IV induction Ventilation: Two handed mask ventilation required and Oral airway inserted - appropriate to patient size Laryngoscope Size: Mac and 4 Grade View: Grade I Tube type: Oral Tube size: 7.5 mm Number of attempts: 1 Airway Equipment and Method: Stylet and Oral airway Placement Confirmation: ETT inserted through vocal cords under direct vision,  positive ETCO2 and breath sounds checked- equal and bilateral Secured at: 22 cm Tube secured with: Tape Dental Injury: Teeth and Oropharynx as per pre-operative assessment  Comments: Intubation performed by Vickii Penna, SRNA

## 2017-09-23 NOTE — Anesthesia Preprocedure Evaluation (Addendum)
Anesthesia Evaluation  Patient identified by MRN, date of birth, ID band Patient awake    Reviewed: Allergy & Precautions, NPO status , Patient's Chart, lab work & pertinent test results  History of Anesthesia Complications Negative for: history of anesthetic complications  Airway Mallampati: II  TM Distance: >3 FB Neck ROM: Full    Dental  (+) Teeth Intact, Dental Advisory Given,    Pulmonary neg shortness of breath, sleep apnea and Continuous Positive Airway Pressure Ventilation , neg COPD, neg recent URI, neg PE   breath sounds clear to auscultation       Cardiovascular negative cardio ROS   Rhythm:Regular     Neuro/Psych neg Seizures PSYCHIATRIC DISORDERS Anxiety Depression Bipolar Disorder Schizophrenia  Neuromuscular disease    GI/Hepatic Neg liver ROS, hiatal hernia, PUD, GERD  Medicated and Controlled,  Endo/Other  Morbid obesity  Renal/GU negative Renal ROS     Musculoskeletal   Abdominal   Peds  Hematology negative hematology ROS (+)   Anesthesia Other Findings   Reproductive/Obstetrics                            Anesthesia Physical Anesthesia Plan  ASA: III  Anesthesia Plan: General   Post-op Pain Management:    Induction: Intravenous  PONV Risk Score and Plan: 2 and Ondansetron and Dexamethasone  Airway Management Planned: Oral ETT  Additional Equipment: None  Intra-op Plan:   Post-operative Plan: Extubation in OR  Informed Consent: I have reviewed the patients History and Physical, chart, labs and discussed the procedure including the risks, benefits and alternatives for the proposed anesthesia with the patient or authorized representative who has indicated his/her understanding and acceptance.   Dental advisory given  Plan Discussed with: CRNA, Surgeon and Anesthesiologist  Anesthesia Plan Comments:        Anesthesia Quick Evaluation

## 2017-09-23 NOTE — Op Note (Signed)
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  Asst. Surgeon: Ditty  Anesthesia: General  Indication: 31 year old morbidly obese male presents with severe back and left lower extremity pain.  Workup demonstrates evidence of a left-sided L4-5 disc herniation with a caudally migrated fragment causing marked compression of his left L5 nerve root.  Patient presents now for laminotomy and microdiscectomy in hopes of improving his symptoms.  Operative note: After induction of anesthesia, patient positioned prone on the Wilson frame and appropriately padded.  Lumbar region prepped and draped sterilely.  Incision made overlying L4-5.  Dissection performed on the left.  Retractor placed.  X-ray taken.  Level confirmed.  Laminotomy performed using high-speed drill and Kerrison rongeurs.  Ligament flavum elevated and resected.  Underlying thecal sac and L5 nerve root identified.  Marked Cipro until this microdissection of the spinal canal.  Epidural venous plexus coagulated and cut.  Thecal sac and L5 nerve root gently mobilized.  Inferior fragment dissected free and removed in several large pieces.  The disc space itself was otherwise flat.  I did not see any value in cutting into the annulus.  Wound was then irrigated my solution.  Gelfoam was placed topically for hemostasis.  Wounds then closed in layers with Vicryl sutures.  Steri-Strips and sterile dressing were applied.  No apparent complications.  Patient tolerated the procedure well and he returns to the recovery room postop.

## 2017-09-23 NOTE — H&P (Signed)
[name] is an 31 y.o. male.   Chief Complaint: Left leg pain HPI: 30 year old male status post previous left-sided L5-S1 laminotomy and microdiscectomy presents now with recurrent back and left lower trimming a pain. Workup demonstrates evidence of a new disc herniation at L4-5 on the left side with inferior migration causing compression the left L5 nerve root. Patient is failed conservative management presents now for microdiscectomy in hopes of improving his symptoms.  Past Medical History:  Diagnosis Date  . ADHD (attention deficit hyperactivity disorder)   . Anxiety   . Bipolar disorder ([place])   . Chronic lower back pain   . Depression   . Esophagitis    Distal esophageal erosions consistent with mild erosive  reflux esophagitis 12/2008 by EGD   . Gastric ulcer   . GERD (gastroesophageal reflux disease)   . Hiatal hernia   . History of stomach ulcers   . Hx MRSA infection 8/08   right thigh  . Hypercholesteremia    denies  . Obesity   . Occult GI bleeding 12/2008   Trivial upper GI bleed/uncontrolled GERD by upper endoscopy 12/2008, normal f/u endoscopy 03/2009 with SBCE at that time  . OSA on CPAP   . Pneumonia 09/01/2015   "on ATB still" (09/04/2015)  . S/P colonoscopy 11/10, 10/08   Dr [name]  . Schizophrenia [facility])   . Thyroid function test abnormal    Noted in 2011 discharge  . Tobacco dipper     Past Surgical History:  Procedure Laterality Date  . ANKLE FRACTURE SURGERY Left ~ 2008  . BACK SURGERY    . COLONOSCOPY  10/10/2009   anal papilla otherwise normal  . ESOPHAGOGASTRODUODENOSCOPY   04/07/2009   Normal esophagus, small hiatal hernia  . ESOPHAGOGASTRODUODENOSCOPY  01/09/2009   Distal esophageal erosions consistent with mild erosive reflux esophagitis, otherwise normal esophagus, small hiatal herniaotherwise normal stomach, D1-D2   . ESOPHAGOGASTRODUODENOSCOPY  08/26/2007   Normal esophagus, a small hiatal/hernia, otherwise normal stomach D1  through D3  . FRACTURE SURGERY    . ileocolonoscopy  08/26/2007    A normal rectum, colon, and terminal ileum  . INCISION AND DRAINAGE  09/04/2015   "reopened my back incsion"  . LUMBAR MICRODISCECTOMY  08/21/2015  . LUMBAR WOUND DEBRIDEMENT N/A 09/04/2015   Procedure: LUMBAR WOUND DEBRIDEMENT;  Surgeon: Consuella Lose, MD;  Location: [place] [facility];  Service: Neurosurgery;  Laterality: N/A;  . right side sugery     gland removed  . Small bowel capsule  04/11/2009    normal throughout  . VASECTOMY      Family History  Problem Relation Age of Onset  . Leukemia Father 32  . Seizures Mother   . Bipolar disorder Brother   . ADD / ADHD Brother   . Colon cancer Neg Hx    Social History:  reports that he has never smoked. His smokeless tobacco use includes Chew. He reports that he does not drink alcohol or use drugs.  Allergies:  Allergies  Allergen Reactions  . Ibuprofen Other (See Comments)    Makes ulcers bleed  . Influenza Vaccines Shortness Of Breath and Other (See Comments)    Rash and unable to breathe well  . Tylenol [Acetaminophen] Other (See Comments)    Bleeding ulcers  . Bee Venom Swelling    SWELLING REACTION UNSPECIFIED   . Tramadol Itching    Medications Prior to Admission  Medication Sig Dispense Refill  . cetirizine (ZYRTEC) 10 MG tablet Take 10 mg  by mouth at bedtime.    . cyclobenzaprine (FLEXERIL) 10 MG tablet Take 1 tablet (10 mg total) by mouth 3 (three) times daily as needed for muscle spasms. 20 tablet 0  . docusate sodium (COLACE) 100 MG capsule Take 100 mg by mouth every other day. Bedtime    . doxepin (SINEQUAN) 25 MG capsule Take 25 mg by mouth at bedtime.     [name] Kitchen EPINEPHrine 0.3 mg/0.3 mL IJ SOAJ injection Inject 0.3 mg into the muscle as needed (allergic reactions).     . gabapentin (NEURONTIN) 300 MG capsule Take 2 capsules (600 mg total) by mouth 3 (three) times daily. 90 capsule 0  . lamoTRIgine (LAMICTAL) 200 MG tablet Take 200 mg by mouth at  bedtime.    [name] Kitchen lisdexamfetamine (VYVANSE) 60 MG capsule Take 60 mg by mouth every morning.    . Oxycodone HCl 10 MG TABS Take 10 mg by mouth 3 (three) times daily as needed (pain).    . pantoprazole (PROTONIX) 40 MG tablet TAKE ONE TABLET BY MOUTH TWICE DAILY 180 tablet 0  . albuterol (PROVENTIL HFA;VENTOLIN HFA) 108 (90 Base) MCG/ACT inhaler Inhale 1-2 puffs into the lungs every 6 (six) hours as needed for wheezing or shortness of breath. 1 Inhaler 0  . diphenhydrAMINE (BENADRYL) 25 mg capsule Take 25 mg by mouth as needed for allergies.     . hydrOXYzine (ATARAX/VISTARIL) 25 MG tablet Take 1-2 tablets (25-50 mg total) by mouth every 6 (six) hours as needed for itching (may cause drowsiness; do not take Zyrtec with this). (Patient not taking: Reported on 09/15/2017) 30 tablet 0  . mupirocin cream (BACTROBAN) 2 % Apply 1 application topically 2 (two) times daily. (Patient not taking: Reported on 09/15/2017) 15 g 0  . oxyCODONE (ROXICODONE) 5 MG immediate release tablet Take 1 tablet (5 mg total) by mouth every 6 (six) hours as needed for severe pain. (Patient not taking: Reported on 09/15/2017) 20 tablet 0    No results found for this or any previous visit (from the past 48 hour(s)). No results found.  Pertinent items noted in HPI and remainder of comprehensive ROS otherwise negative.  Blood pressure 126/67, pulse 81, temperature 97.7 F (36.5 C), temperature source Oral, resp. rate 20, height 6' (1.829 m), weight (!) 176.5 kg (389 lb 2 oz), SpO2 98 %.  Patient is morbidly obese. Awake and alert. He is oriented and appropriate. Speech is fluent. Judgment and insight are intact. Cranial nerve function normal bilaterally. Motor examination with weaknf dorsiflexion on the left side grading at 4/5 with his anterior tibialis and for muscle 5 with his left extensor hallucis longus. Remainder of motor examination intact. Sensory examination was decreased sensation to pinprick and light touch in his left  L5 dermatome. Deep tendon reflexes hypoactive in both lower extremities. Achilles reflexes are absent. Gait is antalgic. Examination head ears eyes and throat unremarkable. Chest and abdomen are benign. Extremities free from injury or deformity. 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 Plan left L4-5 laminotomy microdiscectomy. Risks and benefits of been explained. Patient wishes to proceed.  Emre Stock A 09/23/2017, 7:35 AM

## 2017-09-24 ENCOUNTER — Encounter (HOSPITAL_COMMUNITY): Payer: Self-pay | Admitting: Neurosurgery

## 2017-09-24 DIAGNOSIS — M5116 Intervertebral disc disorders with radiculopathy, lumbar region: Secondary | ICD-10-CM | POA: Diagnosis not present

## 2017-09-24 NOTE — Discharge Instructions (Signed)

## 2017-09-24 NOTE — Discharge Summary (Signed)
Physician Discharge Summary  Patient ID: [name] MRN: [id number] DOB/AGE: 1986/11/07 31 y.o.  Admit date: 09/23/2017 Discharge date: 09/24/2017  Admission Diagnoses:  Discharge Diagnoses:  Active Problems:   Lumbar disc herniation   Discharged Condition: good  Hospital Course: Patient noted hospital where he underwent uncomplicated a left-sided L4-5 laminotomy and microdiscectomy. Postop we do an well. Preoperative back and radicular pain much improved. Standing and ambulate without difficulty. Ready for discharge home.ient admitted to the hospital where he underwent uncomplicated left-sided N2-3 laminotomy and microdiscectomy.  Postoperatively doing well.  Preoperative back and radicular pain much improved.  Standing and ambulating without difficulty.  Ready for discharge home.  Consults:   Significant Diagnostic Studies:   Treatments:   Discharge Exam: Blood pressure (!) 143/65, pulse 92, temperature 98.6 F (37 C), resp. rate 18, height 6' (1.829 m), weight (!) 176.5 kg (389 lb 2 oz), SpO2 98 %.  Awake and alert. Oriented and appropriate. Speech fluid. Judgment and insight intact. Motor and sensory function extremities are normal. Wound clean and dry. Chest and abdomen benign.  isposition: 01-Home or Self Care   Allergies as of 09/24/2017      Reactions   Ibuprofen Other (See Comments)   Makes ulcers bleed   Influenza Vaccines Shortness Of Breath, Other (See Comments)   Rash and unable to breathe well   Tylenol [acetaminophen] Other (See Comments)   Bleeding ulcers   Bee Venom Swelling   SWELLING REACTION UNSPECIFIED    Tramadol Itching      Medication List    TAKE these medications   albuterol 108 (90 Base) MCG/ACT inhaler Commonly known as:  PROVENTIL HFA;VENTOLIN HFA Inhale 1-2 puffs into the lungs every 6 (six) hours as needed for wheezing or shortness of breath.   cetirizine 10 MG tablet Commonly known as:  ZYRTEC Take 10 mg by mouth at  bedtime.   cyclobenzaprine 10 MG tablet Commonly known as:  FLEXERIL Take 1 tablet (10 mg total) by mouth 3 (three) times daily as needed for muscle spasms.   diphenhydrAMINE 25 mg capsule Commonly known as:  BENADRYL Take 25 mg by mouth as needed for allergies.   docusate sodium 100 MG capsule Commonly known as:  COLACE Take 100 mg by mouth every other day. Bedtime   doxepin 25 MG capsule Commonly known as:  SINEQUAN Take 25 mg by mouth at bedtime.   EPINEPHrine 0.3 mg/0.3 mL Soaj injection Commonly known as:  EPI-PEN Inject 0.3 mg into the muscle as needed (allergic reactions).   gabapentin 300 MG capsule Commonly known as:  NEURONTIN Take 2 capsules (600 mg total) by mouth 3 (three) times daily.   hydrOXYzine 25 MG tablet Commonly known as:  ATARAX/VISTARIL Take 1-2 tablets (25-50 mg total) by mouth every 6 (six) hours as needed for itching (may cause drowsiness; do not take Zyrtec with this).   lamoTRIgine 200 MG tablet Commonly known as:  LAMICTAL Take 200 mg by mouth at bedtime.   lisdexamfetamine 60 MG capsule Commonly known as:  VYVANSE Take 60 mg by mouth every morning.   mupirocin cream 2 % Commonly known as:  BACTROBAN Apply 1 application topically 2 (two) times daily.   Oxycodone HCl 10 MG Tabs Take 10 mg by mouth 3 (three) times daily as needed (pain).   oxyCODONE 5 MG immediate release tablet Commonly known as:  ROXICODONE Take 1 tablet (5 mg total) by mouth every 6 (six) hours as needed for severe pain.   pantoprazole 40  MG tablet Commonly known as:  PROTONIX TAKE ONE TABLET BY MOUTH TWICE DAILY        Signed: Suanne Minahan A 09/24/2017, 9:47 AM

## 2017-09-24 NOTE — Progress Notes (Signed)
Pt doing well. Pt and wife given D/C instructions with Rx's, verbal understanding was provided. Pt's incision is clean and dry with no sign of infection. Pt D/C'd home via wheelchair @ 1030 per MD order. Pt is stable @ D/C and has no other needs at this time. Holli Humbles, RN

## 2017-09-25 NOTE — Anesthesia Postprocedure Evaluation (Signed)
Anesthesia Post Note  Patient: [name]  Procedure(s) Performed: Microdiscectomy - left - Lumbar four-Lumbar five (Left Back)     Patient location during evaluation: PACU Anesthesia Type: General Level of consciousness: awake and alert Pain management: pain level controlled Vital Signs Assessment: post-procedure vital signs reviewed and stable Respiratory status: spontaneous breathing, nonlabored ventilation, respiratory function stable and patient connected to nasal cannula oxygen Cardiovascular status: blood pressure returned to baseline and stable Postop Assessment: no apparent nausea or vomiting Anesthetic complications: no    Last Vitals:  Vitals:   09/24/17 0500 09/24/17 0751  BP: 116/64 (!) 143/65  Pulse: 87 92  Resp: 18 18  Temp: 36.8 C 37 C  SpO2: 97% 98%    Last Pain:  Vitals:   09/24/17 1030  TempSrc:   PainSc: 4                  Miley Blanchett

## 2017-10-23 ENCOUNTER — Emergency Department (HOSPITAL_COMMUNITY)
Admission: EM | Admit: 2017-10-23 | Discharge: 2017-10-23 | Disposition: A | Discharge status: Home / Self Care | Payer: Medicaid Other | Attending: Emergency Medicine | Admitting: Emergency Medicine

## 2017-10-23 ENCOUNTER — Emergency Department (HOSPITAL_COMMUNITY): Payer: Medicaid Other

## 2017-10-23 ENCOUNTER — Encounter (HOSPITAL_COMMUNITY): Payer: Self-pay | Admitting: Emergency Medicine

## 2017-10-23 DIAGNOSIS — Z789 Other specified health status: Secondary | ICD-10-CM | POA: Diagnosis not present

## 2017-10-23 DIAGNOSIS — F17220 Nicotine dependence, chewing tobacco, uncomplicated: Secondary | ICD-10-CM | POA: Diagnosis not present

## 2017-10-23 DIAGNOSIS — G8928 Other chronic postprocedural pain: Secondary | ICD-10-CM | POA: Diagnosis present

## 2017-10-23 DIAGNOSIS — G8918 Other acute postprocedural pain: Secondary | ICD-10-CM

## 2017-10-23 HISTORY — DX: Pain, unspecified: R52

## 2017-10-23 LAB — CBC WITH DIFFERENTIAL/PLATELET
Basophils Absolute: 0 10*3/uL (ref 0.0–0.1)
Basophils Relative: 1 %
EOS PCT: 3 %
Eosinophils Absolute: 0.2 10*3/uL (ref 0.0–0.7)
HCT: 43.8 % (ref 39.0–52.0)
Hemoglobin: 14.1 g/dL (ref 13.0–17.0)
LYMPHS ABS: 3.1 10*3/uL (ref 0.7–4.0)
LYMPHS PCT: 37 %
MCH: 27.7 pg (ref 26.0–34.0)
MCHC: 32.2 g/dL (ref 30.0–36.0)
MCV: 86.1 fL (ref 78.0–100.0)
MONO ABS: 0.7 10*3/uL (ref 0.1–1.0)
Monocytes Relative: 8 %
NEUTROS ABS: 4.4 10*3/uL (ref 1.7–7.7)
Neutrophils Relative %: 51 %
PLATELETS: 240 10*3/uL (ref 150–400)
RBC: 5.09 MIL/uL (ref 4.22–5.81)
RDW: 14 % (ref 11.5–15.5)
WBC: 8.5 10*3/uL (ref 4.0–10.5)

## 2017-10-23 LAB — COMPREHENSIVE METABOLIC PANEL
ALT: 27 U/L (ref 17–63)
AST: 21 U/L (ref 15–41)
Albumin: 3.9 g/dL (ref 3.5–5.0)
Alkaline Phosphatase: 56 U/L (ref 38–126)
Anion gap: 6 (ref 5–15)
BUN: 9 mg/dL (ref 6–20)
CHLORIDE: 103 mmol/L (ref 101–111)
CO2: 29 mmol/L (ref 22–32)
CREATININE: 0.87 mg/dL (ref 0.61–1.24)
Calcium: 9.3 mg/dL (ref 8.9–10.3)
Glucose, Bld: 94 mg/dL (ref 65–99)
POTASSIUM: 4.2 mmol/L (ref 3.5–5.1)
Sodium: 138 mmol/L (ref 135–145)
Total Bilirubin: 0.6 mg/dL (ref 0.3–1.2)
Total Protein: 6.8 g/dL (ref 6.5–8.1)

## 2017-10-23 LAB — SEDIMENTATION RATE: Sed Rate: 1 mm/hr (ref 0–16)

## 2017-10-23 MED ORDER — MORPHINE SULFATE (PF) 4 MG/ML IV SOLN
4.0000 mg | Freq: Once | INTRAVENOUS | Status: AC
  Administered 2017-10-23: 4 mg via INTRAMUSCULAR
  Filled 2017-10-23: qty 1

## 2017-10-23 MED ORDER — DOXYCYCLINE HYCLATE 100 MG PO CAPS: 100.0000 mg | ORAL_CAPSULE | Freq: Two times a day (BID) | ORAL | 0 refills | Status: DC

## 2017-10-23 MED ORDER — DOXYCYCLINE HYCLATE 100 MG IV SOLR
INTRAVENOUS | Status: AC
  Filled 2017-10-23: qty 100

## 2017-10-23 MED ORDER — OXYCODONE HCL 5 MG PO TABS
10.0000 mg | ORAL_TABLET | Freq: Once | ORAL | Status: AC
  Administered 2017-10-23: 10 mg via ORAL
  Filled 2017-10-23: qty 2

## 2017-10-23 MED ORDER — ONDANSETRON 4 MG PO TBDP
4.0000 mg | ORAL_TABLET | Freq: Once | ORAL | Status: AC
  Administered 2017-10-23: 4 mg via ORAL
  Filled 2017-10-23: qty 1

## 2017-10-23 MED ORDER — DOXYCYCLINE HYCLATE 100 MG IV SOLR
100.0000 mg | Freq: Once | INTRAVENOUS | Status: AC
  Administered 2017-10-23: 100 mg via INTRAVENOUS
  Filled 2017-10-23: qty 100

## 2017-10-23 MED ORDER — IOPAMIDOL (ISOVUE-300) INJECTION 61%
125.0000 mL | Freq: Once | INTRAVENOUS | Status: AC | PRN
  Administered 2017-10-23: 125 mL via INTRAVENOUS

## 2017-10-23 NOTE — ED Triage Notes (Signed)
Back surgery on October 30th, states he is having drainage from incision and swelling

## 2017-10-23 NOTE — ED Provider Notes (Signed)
[facility] EMERGENCY DEPARTMENT Provider Note   CSN: [id number] Arrival date & time: 10/23/17  1540     History   Chief Complaint Chief Complaint  Patient presents with  . Post-op Problem    HPI [name] is a 31 y.o. male.  HPI Patient had lumbar laminectomy/decompression and microdiscectomy performed on 09/23/17 by Dr. Trenton Gammon.  States he developed drainage from the wound about a week ago.  Was evaluated in Dr. Irven [place] office and started on Keflex.  Over the last 2 days he has had swelling mostly to the left flank.  He has had subjective fevers and chills.  States that his low back pain radiating to the left leg for which she underwent surgery has improved significantly.  Has almost finished antibiotics. Past Medical History:  Diagnosis Date  . ADHD (attention deficit hyperactivity disorder)   . Anxiety   . Bipolar disorder ([place])   . Chronic lower back pain   . Depression   . Esophagitis    Distal esophageal erosions consistent with mild erosive  reflux esophagitis 12/2008 by EGD   . Gastric ulcer   . GERD (gastroesophageal reflux disease)   . Hiatal hernia   . History of stomach ulcers   . Hx MRSA infection 8/08   right thigh  . Hypercholesteremia    denies  . Obesity   . Occult GI bleeding 12/2008   Trivial upper GI bleed/uncontrolled GERD by upper endoscopy 12/2008, normal f/u endoscopy 03/2009 with SBCE at that time  . OSA on CPAP   . Pain management   . Pneumonia 09/01/2015   "on ATB still" (09/04/2015)  . S/P colonoscopy 11/10, 10/08   Dr [name]  . Schizophrenia [facility])   . Thyroid function test abnormal    Noted in 2011 discharge  . Tobacco dipper     Patient Active Problem List   Diagnosis Date Noted  . 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 Wound drainage 09/04/2015  . Seasonal and perennial allergic rhinitis 07/23/2014  . Abdominal mass of other site 06/15/2012  . Abdominal cramps 06/15/2012  . Obstructive sleep apnea 05/03/2012  .  Syncope and collapse 05/03/2012  . Chest pain 05/03/2012  . Obesity, morbid ([place]) 05/03/2012  . Anal fissure 05/27/2011  . UNSPECIFIED ANEMIA 09/11/2009  . Blood in stool 04/06/2009  . ABDOMINAL PAIN OTHER SPECIFIED SITE 03/07/2009  . BIPOLAR DISORDER UNSPECIFIED 03/06/2009  . SMOKELESS TOBACCO ABUSE 03/06/2009  . ATTENTION DEFICIT HYPERACTIVITY DISORDER 03/06/2009  . GERD 03/06/2009  . NAUSEA AND VOMITING 03/06/2009  . DIARRHEA 03/06/2009    Past Surgical History:  Procedure Laterality Date  . ANKLE FRACTURE SURGERY Left ~ 2008  . BACK SURGERY    . COLONOSCOPY  10/10/2009   anal papilla otherwise normal  . ESOPHAGOGASTRODUODENOSCOPY   04/07/2009   Normal esophagus, small hiatal hernia  . ESOPHAGOGASTRODUODENOSCOPY  01/09/2009   Distal esophageal erosions consistent with mild erosive reflux esophagitis, otherwise normal esophagus, small hiatal herniaotherwise normal stomach, D1-D2   . ESOPHAGOGASTRODUODENOSCOPY  08/26/2007   Normal esophagus, a small hiatal/hernia, otherwise normal stomach D1 through D3  . FRACTURE SURGERY    . ileocolonoscopy  08/26/2007    A normal rectum, colon, and terminal ileum  . INCISION AND DRAINAGE  09/04/2015   "reopened my back incsion"  . LUMBAR LAMINECTOMY/DECOMPRESSION MICRODISCECTOMY Left 09/23/2017   Procedure: Microdiscectomy - left - Lumbar four-Lumbar five;  Surgeon: Earnie Larsson, MD;  Location: [place];  Service: Neurosurgery;  Laterality: Left;  . LUMBAR MICRODISCECTOMY  08/21/2015  . LUMBAR MICRODISCECTOMY Left 09/23/2017   L4-5  . LUMBAR WOUND DEBRIDEMENT N/A 09/04/2015   Procedure: LUMBAR WOUND DEBRIDEMENT;  Surgeon: Consuella Lose, MD;  Location: [place] [facility];  Service: Neurosurgery;  Laterality: N/A;  . right side sugery     gland removed  . Small bowel capsule  04/11/2009    normal throughout  . VASECTOMY         Home Medications    Prior to Admission medications   Medication Sig Start Date End Date Taking? Authorizing  Provider  albuterol (PROVENTIL HFA;VENTOLIN HFA) 108 (90 Base) MCG/ACT inhaler Inhale 1-2 puffs into the lungs every 6 (six) hours as needed for wheezing or shortness of breath. 11/26/16  Yes Doristine Devoid, PA-C  cetirizine (ZYRTEC) 10 MG tablet Take 10 mg by mouth at bedtime.   Yes [provider]  cyclobenzaprine (FLEXERIL) 10 MG tablet Take 1 tablet (10 mg total) by mouth 3 (three) times daily as needed for muscle spasms. 07/27/17  Yes Milton Ferguson, MD  diphenhydrAMINE (BENADRYL) 25 mg capsule Take 25 mg by mouth as needed for allergies.    Yes [provider]  docusate sodium (COLACE) 100 MG capsule Take 100 mg by mouth every other day. Bedtime   Yes [provider]  doxepin (SINEQUAN) 25 MG capsule Take 25 mg by mouth at bedtime.    Yes [provider]  EPINEPHrine 0.3 mg/0.3 mL IJ SOAJ injection Inject 0.3 mg into the muscle as needed (allergic reactions).    Yes [provider]  gabapentin (NEURONTIN) 300 MG capsule Take 2 capsules (600 mg total) by mouth 3 (three) times daily. 07/29/17  Yes Mesner, Corene Cornea, MD  lamoTRIgine (LAMICTAL) 200 MG tablet Take 200 mg by mouth at bedtime.   Yes [provider]  Oxycodone HCl 10 MG TABS Take 10 mg by mouth 3 (three) times daily as needed (pain).   Yes [provider]  pantoprazole (PROTONIX) 40 MG tablet TAKE ONE TABLET BY MOUTH TWICE DAILY 07/23/17  Yes Claretta Fraise, MD  doxycycline (VIBRAMYCIN) 100 MG capsule Take 1 capsule (100 mg total) by mouth 2 (two) times daily. One po bid x 7 days 10/23/17   Julianne Rice, MD  hydrOXYzine (ATARAX/VISTARIL) 25 MG tablet Take 1-2 tablets (25-50 mg total) by mouth every 6 (six) hours as needed for itching (may cause drowsiness; do not take Zyrtec with this). Patient not taking: Reported on 09/15/2017 06/07/17   Molpus, Jenny Reichmann, MD  mupirocin cream (BACTROBAN) 2 % Apply 1 application topically 2 (two) times daily. Patient not taking: Reported on  09/15/2017 08/24/17   Noemi Chapel, MD  oxyCODONE (ROXICODONE) 5 MG immediate release tablet Take 1 tablet (5 mg total) by mouth every 6 (six) hours as needed for severe pain. Patient not taking: Reported on 09/15/2017 07/27/17   Milton Ferguson, MD    Family History Family History  Problem Relation Age of Onset  . Leukemia Father 64  . Seizures Mother   . Bipolar disorder Brother   . ADD / ADHD Brother   . Colon cancer Neg Hx     Social History Social History   Tobacco Use  . Smoking status: Never Smoker  . Smokeless tobacco: Current User    Types: Chew  Substance Use Topics  . Alcohol use: No    Alcohol/week: 0.0 oz  . Drug use: No     Allergies   Ibuprofen; Influenza vaccines; Tylenol [acetaminophen]; Bee venom; and Tramadol   Review of  Systems Review of Systems  Constitutional: Positive for chills and fever.  Respiratory: Negative for shortness of breath.   Cardiovascular: Negative for chest pain.  Gastrointestinal: Negative for abdominal pain, diarrhea, nausea and vomiting.  Musculoskeletal: Negative for back pain and myalgias.  Skin: Negative for rash.  Neurological: Negative for weakness and numbness.  All other systems reviewed and are negative.    Physical Exam Updated Vital Signs BP 121/67 (BP Location: Left Arm)   Pulse 93   Temp 98.6 F (37 C) (Oral)   Resp 18   Ht 6' (1.829 m)   Wt (!) 181.4 kg (400 lb)   SpO2 100%   BMI 54.25 kg/m   Physical Exam  Constitutional: He is oriented to person, place, and time. He appears well-developed and well-nourished. No distress.  HENT:  Head: Normocephalic and atraumatic.  Mouth/Throat: Oropharynx is clear and moist.  Eyes: EOM are normal. Pupils are equal, round, and reactive to light.  Neck: Normal range of motion. Neck supple.  Cardiovascular: Normal rate and regular rhythm.  Pulmonary/Chest: Effort normal and breath sounds normal.  Abdominal: Soft. Bowel sounds are normal. There is no tenderness.  There is no rebound and no guarding.  Musculoskeletal: Normal range of motion. He exhibits no edema or tenderness.  Patient has midline vertical lumbar surgical scar.  Mildly dehisced at the superior end.  Small amount of clear fluid is draining from the site.  No erythema or warmth appreciated.  Patient has mild left lumbar flank swelling.  Is difficult to assess the degree of swelling given patient body habitus.  No definite fluctuant mass, erythema or warmth.  Distal lower extremity pulses are 2+.  Neurological: He is alert and oriented to person, place, and time.  Moves all extremities without focal deficit.  Sensation intact.  Skin: Skin is warm and dry. No rash noted. No erythema.  Psychiatric: He has a normal mood and affect. His behavior is normal.  Nursing note and vitals reviewed.    ED Treatments / Results  Labs (all labs ordered are listed, but only abnormal results are displayed) Labs Reviewed  CBC WITH DIFFERENTIAL/PLATELET  COMPREHENSIVE METABOLIC PANEL  SEDIMENTATION RATE  C-REACTIVE PROTEIN    EKG  EKG Interpretation None       Radiology Ct Lumbar Spine W Contrast  Result Date: 10/23/2017 CLINICAL DATA:  Recent back surgery with drainage from incision and swelling EXAM: CT LUMBAR SPINE WITH CONTRAST TECHNIQUE: Multidetector CT imaging of the lumbar spine was performed with intravenous contrast administration. CONTRAST:  147mL ISOVUE-300 IOPAMIDOL (ISOVUE-300) INJECTION 61% COMPARISON:  09/23/2017, 09/06/2017, 07/29/2017 FINDINGS: Segmentation: 5 lumbar type vertebrae. Alignment: Normal. Vertebrae: Normal stature.  No fracture. Paraspinal and other soft tissues: Skin thickening posteriorly. Mild rim enhancing fluid collection within the subcutaneous fat of the posterior lumbar region, this measures 6.5 cm AP x 3 cm transverse by a 9.8 cm craniocaudad and extends from approximate inferior L3 level to the upper sacrum. It is contiguous with the skin surface. Disc levels:  Disc space narrowing at L5-S1. Left hemi laminectomy at L4-L5 and L5-S1. IMPRESSION: 1. Mildly rim enhancing fluid collection within the posterior subcutaneous fatty tissues of the lower lumbar spine, slightly to the left of midline, this measures 6.5 cm AP x 3 cm transverse by 9.8 cm cranial caudad and appears contiguous with the skin surface. There is mild soft tissue stranding and edema around the fluid collection. Fluid collection could represent infected or inflammatory collection, or organizing post- operative collection. 2. Interval postsurgical  changes at L4-L5. Electronically Signed   By: Donavan Foil M.D.   On: 10/23/2017 19:45    Procedures Procedures (including critical care time)  Medications Ordered in ED Medications  doxycycline (VIBRAMYCIN) 100 mg in dextrose 5 % 250 mL IVPB (not administered)  morphine 4 MG/ML injection 4 mg (4 mg Intramuscular Given 10/23/17 1725)  iopamidol (ISOVUE-300) 61 % injection 125 mL (125 mLs Intravenous Contrast Given 10/23/17 1840)  oxyCODONE (Oxy IR/ROXICODONE) immediate release tablet 10 mg (10 mg Oral Given 10/23/17 1955)  ondansetron (ZOFRAN-ODT) disintegrating tablet 4 mg (4 mg Oral Given 10/23/17 1955)     Initial Impression / Assessment and Plan / ED Course  I have reviewed the triage vital signs and the nursing notes.  Pertinent labs & imaging results that were available during my care of the patient were reviewed by me and considered in my medical decision making (see chart for details).     Discussed with Dr. Saintclair Halsted who reviewed the patient's CT scan.  Fluid collection may be postoperative versus infectious.  Normal white blood cell count.  Advised sending CRP and sed rate.  Start the patient on doxycycline and patient will follow up with Dr. Trenton Gammon in the office tomorrow.  May need MRI for further evaluation.  Patient continues to be well-appearing.  Agrees with plan to follow-up with Dr. Trenton Gammon in the office tomorrow.  Return precautions  given.  Final Clinical Impressions(s) / ED Diagnoses   Final diagnoses:  Post-operative pain    ED Discharge Orders        Ordered    doxycycline (VIBRAMYCIN) 100 MG capsule  2 times daily     10/23/17 2014       Julianne Rice, MD 10/23/17 2022

## 2017-10-24 LAB — C-REACTIVE PROTEIN: CRP: <0.8 mg/dL (ref <1.0)

## 2017-10-24 IMAGING — MR MR LUMBAR SPINE WO/W CM
4 of 7 series · 20 of 48 positions shown · IV contrast (multihance)
Comparison: CT lumbar spine 07/29/2017. MRI lumbar spine 05/03/2015
and 02/25/2015

CLINICAL DATA: Patient status post L5-S1 microdiskectomy
08/21/2015. Low back pain radiating into both legs and right groin
and leg with numbness. Symptoms for 4 months. No known injury.

EXAM:
MRI LUMBAR SPINE WITHOUT AND WITH CONTRAST
TECHNIQUE: Multiplanar and multiecho pulse sequences of the lumbar spine were
obtained without and with intravenous contrast.
CONTRAST:  20 ml MULTIHANCE GADOBENATE DIMEGLUMINE 529 MG/ML IV SOLN

[Series 10: T1 · sagittal · 4.0mm · 0.88mm/px · 5 of 17 slices shown (1 of 2)]
[im 1/17]
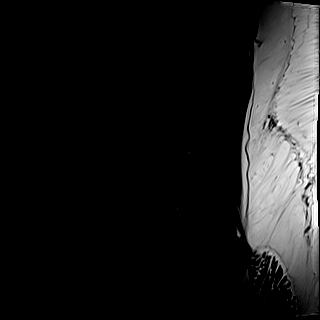
[im 5/17]
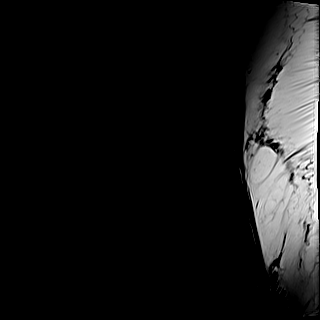
[im 9/17]
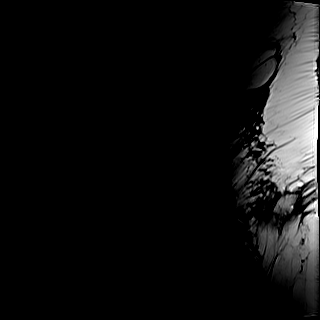
[im 13/17]
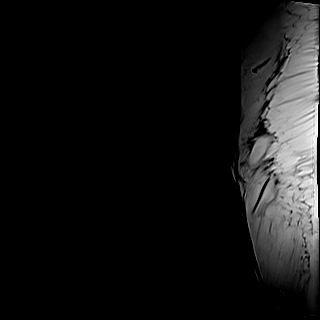
[im 17/17]
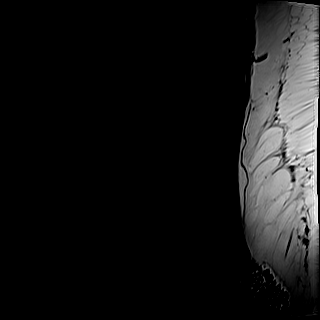

[Series 11: T2 · axial · 4.0mm · 0.31mm/px · z∈[-79,+149]mm · 8 of 40 slices shown (1 of 2)]
[im 1/40]
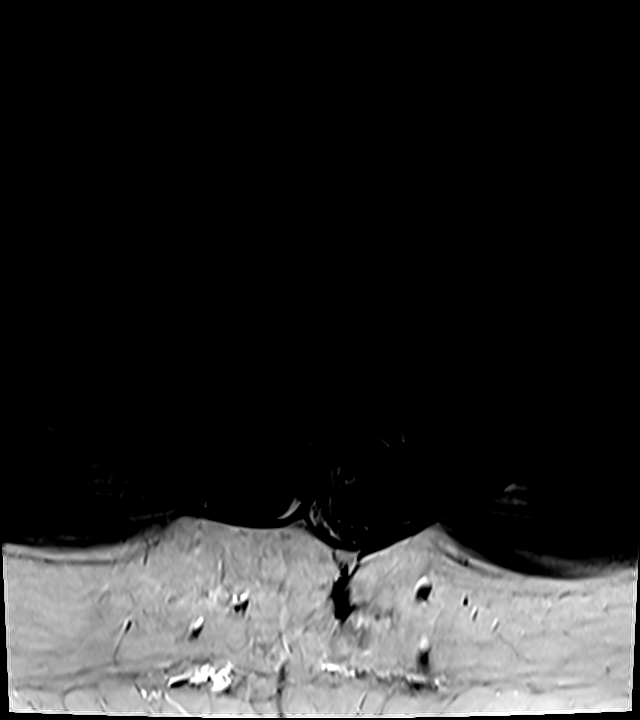
[im 5/40]
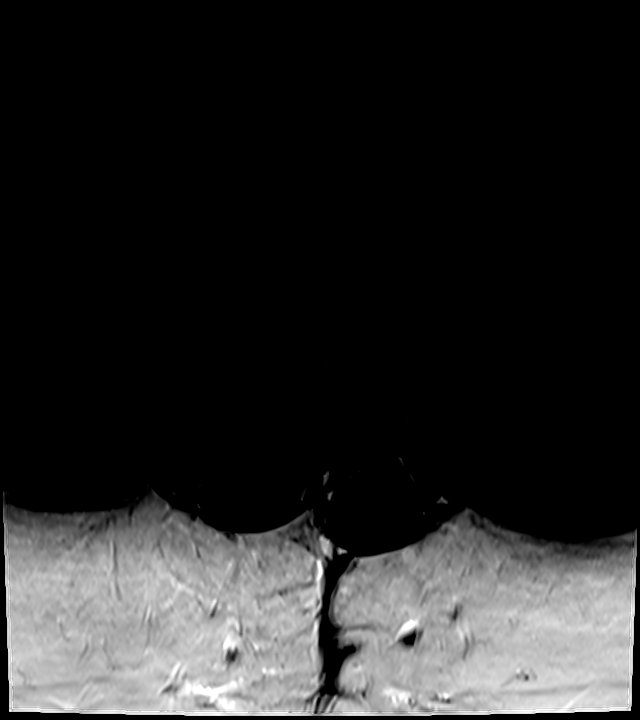
[im 14/40]
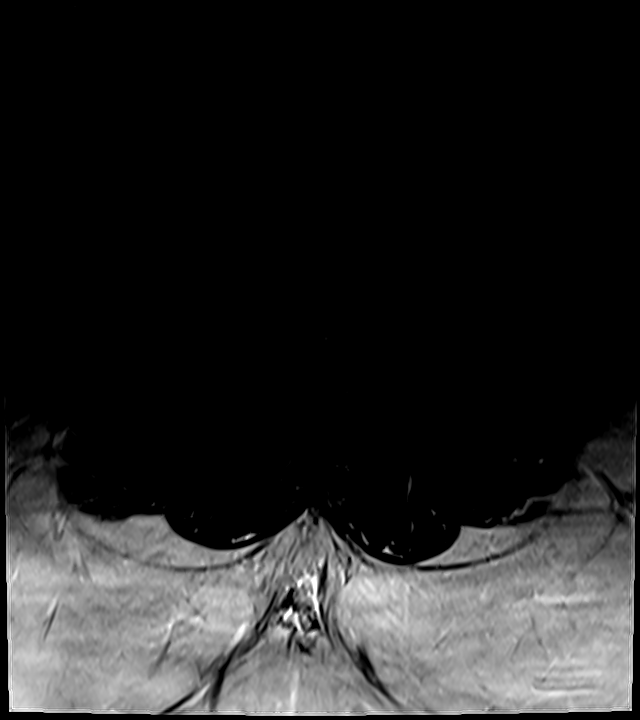
[im 18/40]
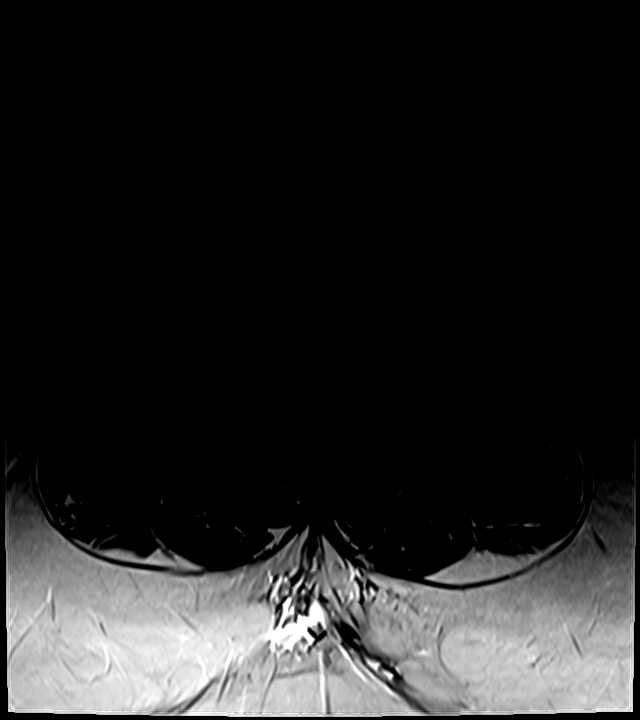
[im 22/40]
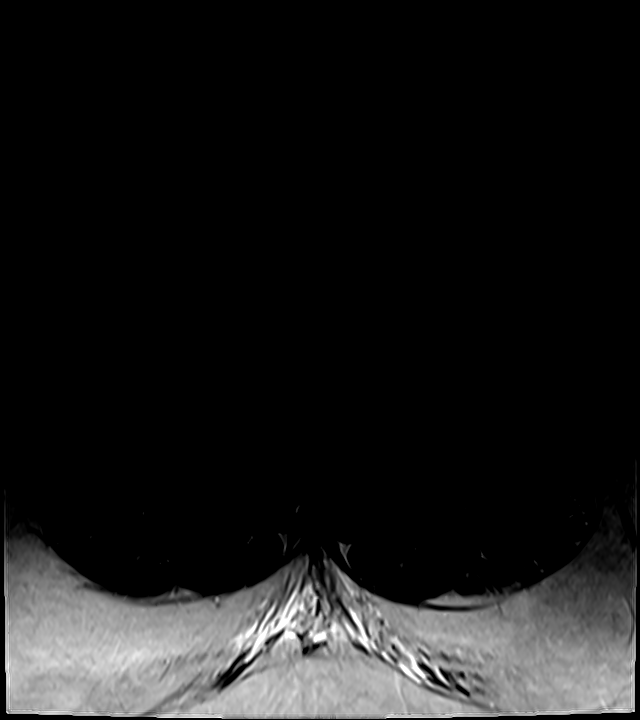
[im 27/40]
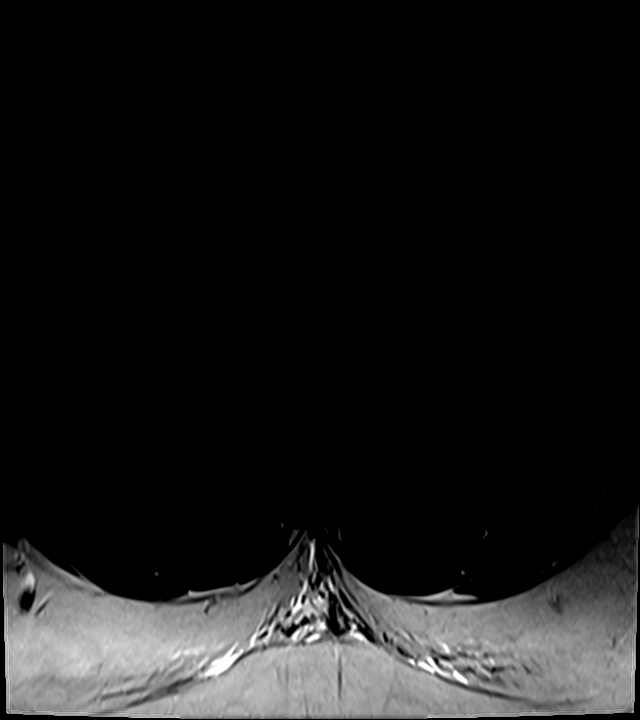
[im 35/40]
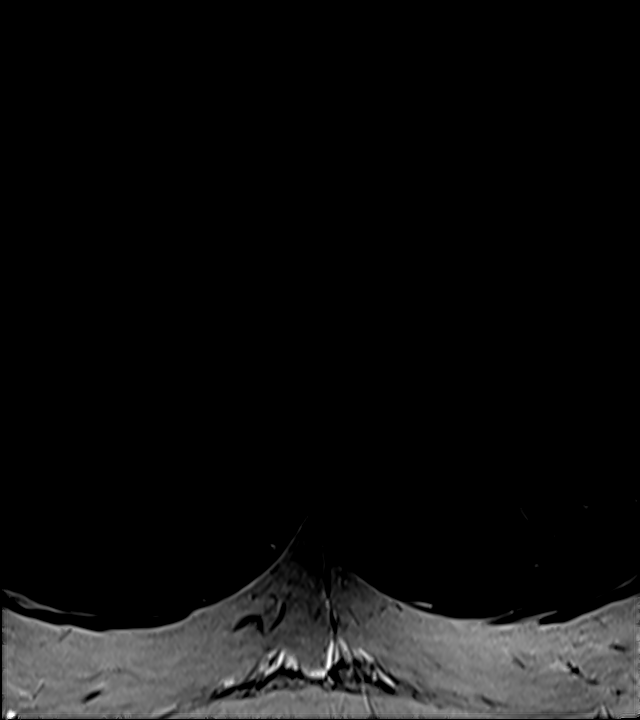
[im 40/40]
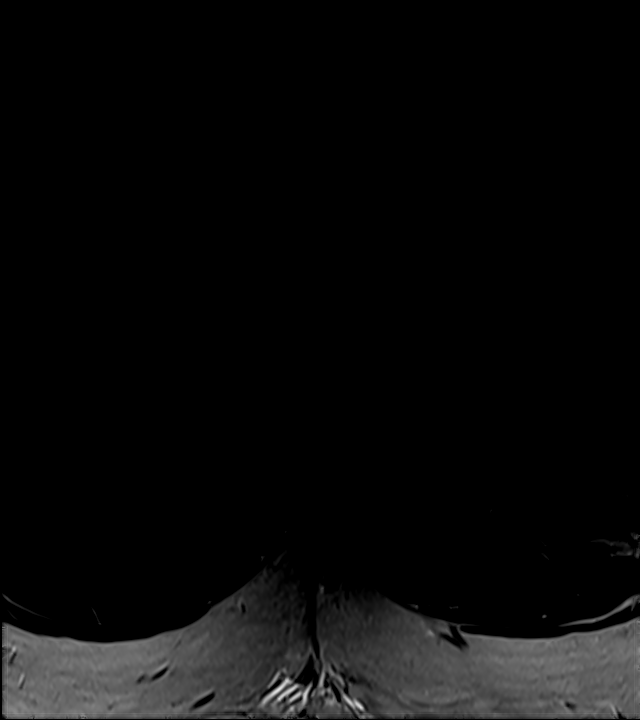

[Series 12: T1 · axial · 4.0mm · 0.62mm/px · z∈[-56,+124]mm · 3 of 40 slices shown (2 of 2)]
[im 5/40]
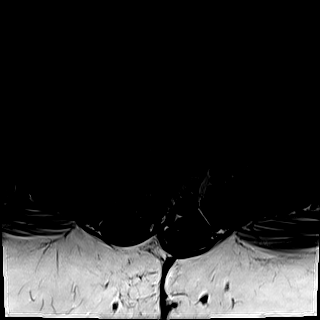
[im 22/40]
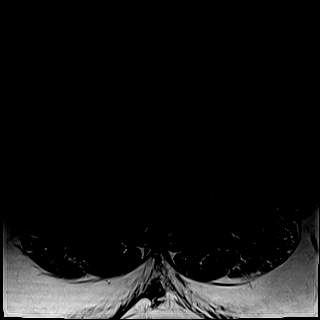
[im 35/40]
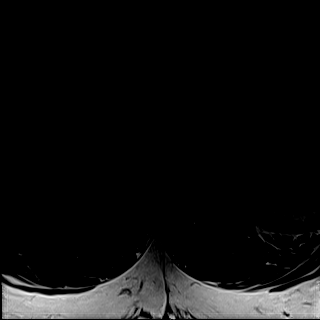

[Series 13: T2 · sagittal · 4.0mm · 0.73mm/px · 4 of 17 slices shown (2 of 2)]
[im 1/17]
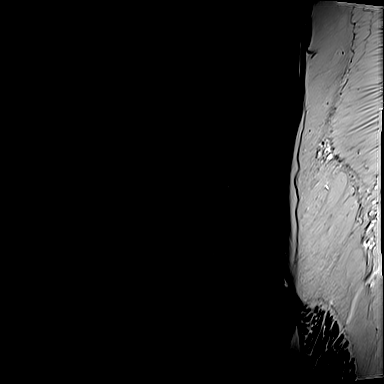
[im 6/17]
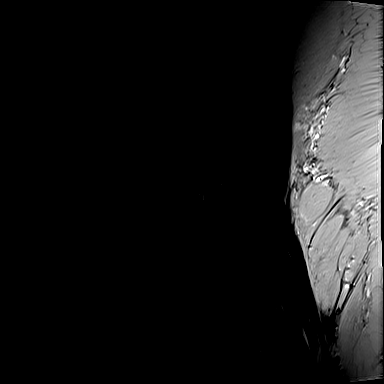
[im 11/17]
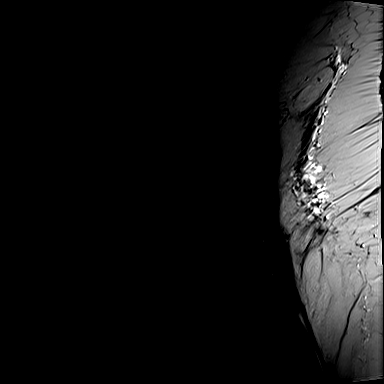
[im 17/17]
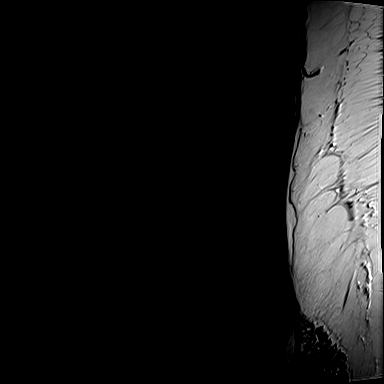

[20 of 48 positions shown; findings below may reference images not displayed]

FINDINGS: This exam is limited by the patient's size.

Segmentation:  Standard.

Alignment:  Trace retrolisthesis L4 on L5 noted.

Vertebrae: No fracture or worrisome lesion. Mild degenerative
endplate signal change L5-S1 is seen.

Conus medullaris: Extends to the T12-L1 level and appears normal.

Paraspinal and other soft tissues: Negative.

Disc levels:

T11-12 is imaged in the sagittal plane only. There is a shallow disc
bulge without stenosis.

T12-L1:  Negative.

L1-2:  Negative.

L2-3:  Negative.

L3-4:  Tiny central protrusion without stenosis.

L4-5: The patient has a new central and left paracentral down
turning disc protrusion. The disc impinges on the descending left L5
root. The foramina are open.

L5-S1: Left laminotomy defect is identified. The central canal and
lateral recesses appear patent. Severe bilateral foraminal narrowing
due to disc and facet arthropathy appears worse on the left.
IMPRESSION: New down turning central and left paracentral protrusion at L4-5
impinges on the descending left L5 root in the lateral recess.

Disc and facet arthropathy cause severe bilateral foraminal
narrowing at L5-S1, worse on the left. The patient is status post
left laminotomy at this level. The central canal and subarticular
recesses appear patent.

## 2017-11-05 ENCOUNTER — Other Ambulatory Visit: Payer: Self-pay | Admitting: Family Medicine

## 2017-11-05 DIAGNOSIS — K219 Gastro-esophageal reflux disease without esophagitis: Secondary | ICD-10-CM

## 2017-12-05 ENCOUNTER — Telehealth: Payer: Self-pay | Admitting: Internal Medicine

## 2017-12-05 NOTE — Telephone Encounter (Signed)
Spoke with pt, he states he went for a [employer] physical yesterday. They need results from a sleep study faxed to [facility] on [address]. I faxed sleep study to them. Nothing further is needed.

## 2017-12-09 ENCOUNTER — Ambulatory Visit: Payer: Medicaid Other | Admitting: Family

## 2017-12-09 ENCOUNTER — Encounter: Payer: Self-pay | Admitting: Family

## 2017-12-09 VITALS — BP 127/85 | HR 103 | Temp 97.5°F | Ht 70.0 in | Wt >= 6400 oz

## 2017-12-09 DIAGNOSIS — D171 Benign lipomatous neoplasm of skin and subcutaneous tissue of trunk: Secondary | ICD-10-CM

## 2017-12-09 DIAGNOSIS — J019 Acute sinusitis, unspecified: Secondary | ICD-10-CM | POA: Diagnosis not present

## 2017-12-09 MED ORDER — AMOXICILLIN-POT CLAVULANATE 875-125 MG PO TABS: 1.0000 | ORAL_TABLET | Freq: Two times a day (BID) | ORAL | 0 refills | Status: DC

## 2017-12-09 NOTE — Patient Instructions (Signed)
Lipoma A lipoma is a noncancerous (benign) tumor that is made up of fat cells. This is a very common type of soft-tissue growth. Lipomas are usually found under the skin (subcutaneous). They may occur in any tissue of the body that contains fat. Common areas for lipomas to appear include the back, shoulders, buttocks, and thighs. Lipomas grow slowly, and they are usually painless. Most lipomas do not cause problems and do not require treatment. What are the causes? The cause of this condition is not known. What increases the risk? This condition is more likely to develop in:  People who are 40-60 years old.  People who have a family history of lipomas.  What are the signs or symptoms? A lipoma usually appears as a small, round bump under the skin. It may feel soft or rubbery, but the firmness can vary. Most lipomas are not painful. However, a lipoma may become painful if it is located in an area where it pushes on nerves. How is this diagnosed? A lipoma can usually be diagnosed with a physical exam. You may also have tests to confirm the diagnosis and to rule out other conditions. Tests may include:  Imaging tests, such as a CT scan or MRI.  Removal of a tissue sample to be looked at under a microscope (biopsy).  How is this treated? Treatment is not needed for small lipomas that are not causing problems. If a lipoma continues to get bigger or it causes problems, removal is often the best option. Lipomas can also be removed to improve appearance. Removal of a lipoma is usually done with a surgery in which the fatty cells and the surrounding capsule are removed. Most often, a medicine that numbs the area (local anesthetic) is used for this procedure. Follow these instructions at home:  Keep all follow-up visits as directed by your health care provider. This is important. Contact a health care provider if:  Your lipoma becomes larger or hard.  Your lipoma becomes painful, red, or  increasingly swollen. These could be signs of infection or a more serious condition. This information is not intended to replace advice given to you by your health care provider. Make sure you discuss any questions you have with your health care provider. Document Released: 11/01/2002 Document Revised: 04/18/2016 Document Reviewed: 11/07/2014 Elsevier Interactive Patient Education  2018 [employer]vier Inc.  

## 2017-12-09 NOTE — Progress Notes (Signed)
   Subjective:    Patient ID: [name], male    DOB: 08/25/1986, 32 y.o.   MRN: [id number]  Sinusitis  This is a new problem. The current episode started in the past 7 days. The problem has been gradually worsening since onset. There has been no fever. His pain is at a severity of 6/10. The pain is moderate. Associated symptoms include congestion, headaches, a hoarse voice and sinus pressure. Pertinent negatives include no chills, coughing, ear pain, sneezing or sore throat. Past treatments include nothing. The treatment provided no relief.   Pt also complaining of a "knot" on his upper back that he noticed last week that is becoming larger and more tender. Pt denies any discharge or injury.    Review of Systems  Constitutional: Negative for chills.  HENT: Positive for congestion, hoarse voice and sinus pressure. Negative for ear pain, sneezing and sore throat.   Respiratory: Negative for cough.   Neurological: Positive for headaches.  All other systems reviewed and are negative.      Objective:   Physical Exam  Constitutional: He is oriented to person, place, and time. He appears well-developed and well-nourished. No distress.  HENT:  Head: Normocephalic.  Right Ear: External ear normal.  Left Ear: External ear normal.  Nose: Mucosal edema and rhinorrhea present. Right sinus exhibits frontal sinus tenderness. Left sinus exhibits frontal sinus tenderness.  Mouth/Throat: Posterior oropharyngeal erythema present.  Eyes: Pupils are equal, round, and reactive to light. Right eye exhibits no discharge. Left eye exhibits no discharge.  Neck: Normal range of motion. Neck supple. No thyromegaly present.  Cardiovascular: Normal rate, regular rhythm, normal heart sounds and intact distal pulses.  No murmur heard. Pulmonary/Chest: Effort normal and breath sounds normal. No respiratory distress. He has no wheezes.  Abdominal: Soft. Bowel sounds are normal. He exhibits no distension.  There is no tenderness.  Musculoskeletal: Normal range of motion. He exhibits no edema or tenderness.  Neurological: He is alert and oriented to person, place, and time.  Skin: Skin is warm and dry. Rash noted. Rash is nodular (2X2Cm nodule on left upper back, no erythemas or dischare present). No erythema.     Psychiatric: He has a normal mood and affect. His behavior is normal. Judgment and thought content normal.  Vitals reviewed.     BP 127/85   Pulse (!) 103   Temp (!) 97.5 F (36.4 C) (Oral)   Ht 5\' 10"  (1.778 m)   Wt (!) 424 lb (192.3 kg)   BMI 60.84 kg/m      Assessment & Plan:  1. Acute sinusitis, recurrence not specified, unspecified location - Take meds as prescribed - Use a cool mist humidifier  -Force fluids -For any cough or congestion  Use plain Mucinex- regular strength or max strength is fine -For fever or aces or pains- take tylenol or ibuprofen appropriate for age and weight. -Throat lozenges if help - amoxicillin-clavulanate (AUGMENTIN) 875-125 MG tablet; Take 1 tablet by mouth 2 (two) times daily.  Dispense: 14 tablet; Refill: 0  2. Lipoma of torso Do no pick or squeeze  Let me know if area becomes larger or tender   Evelina Dun, FNP

## 2017-12-20 ENCOUNTER — Other Ambulatory Visit: Payer: Self-pay | Admitting: Family

## 2017-12-20 DIAGNOSIS — L500 Allergic urticaria: Secondary | ICD-10-CM

## 2018-01-23 ENCOUNTER — Other Ambulatory Visit: Payer: Self-pay

## 2018-01-23 ENCOUNTER — Emergency Department (HOSPITAL_BASED_OUTPATIENT_CLINIC_OR_DEPARTMENT_OTHER): Payer: Medicaid Other

## 2018-01-23 ENCOUNTER — Emergency Department (HOSPITAL_BASED_OUTPATIENT_CLINIC_OR_DEPARTMENT_OTHER)
Admission: EM | Admit: 2018-01-23 | Discharge: 2018-01-23 | Disposition: A | Discharge status: Home / Self Care | Payer: Medicaid Other | Attending: Emergency Medicine | Admitting: Emergency Medicine

## 2018-01-23 ENCOUNTER — Encounter (HOSPITAL_BASED_OUTPATIENT_CLINIC_OR_DEPARTMENT_OTHER): Payer: Self-pay

## 2018-01-23 DIAGNOSIS — Y999 Unspecified external cause status: Secondary | ICD-10-CM | POA: Diagnosis not present

## 2018-01-23 DIAGNOSIS — R1013 Epigastric pain: Secondary | ICD-10-CM

## 2018-01-23 DIAGNOSIS — R1031 Right lower quadrant pain: Secondary | ICD-10-CM | POA: Diagnosis not present

## 2018-01-23 DIAGNOSIS — Y939 Activity, unspecified: Secondary | ICD-10-CM | POA: Insufficient documentation

## 2018-01-23 DIAGNOSIS — S29012A Strain of muscle and tendon of back wall of thorax, initial encounter: Secondary | ICD-10-CM | POA: Insufficient documentation

## 2018-01-23 DIAGNOSIS — X58XXXA Exposure to other specified factors, initial encounter: Secondary | ICD-10-CM | POA: Diagnosis not present

## 2018-01-23 DIAGNOSIS — S299XXA Unspecified injury of thorax, initial encounter: Secondary | ICD-10-CM | POA: Diagnosis present

## 2018-01-23 DIAGNOSIS — Z79899 Other long term (current) drug therapy: Secondary | ICD-10-CM | POA: Insufficient documentation

## 2018-01-23 DIAGNOSIS — Y929 Unspecified place or not applicable: Secondary | ICD-10-CM | POA: Insufficient documentation

## 2018-01-23 DIAGNOSIS — F909 Attention-deficit hyperactivity disorder, unspecified type: Secondary | ICD-10-CM | POA: Diagnosis not present

## 2018-01-23 LAB — COMPREHENSIVE METABOLIC PANEL
ALBUMIN: 4.1 g/dL (ref 3.5–5.0)
ALK PHOS: 61 U/L (ref 38–126)
ALT: 34 U/L (ref 17–63)
ANION GAP: 9 (ref 5–15)
AST: 27 U/L (ref 15–41)
BUN: 11 mg/dL (ref 6–20)
CHLORIDE: 103 mmol/L (ref 101–111)
CO2: 26 mmol/L (ref 22–32)
Calcium: 9.1 mg/dL (ref 8.9–10.3)
Creatinine, Ser: 1 mg/dL (ref 0.61–1.24)
GFR calc Af Amer: >60 mL/min (ref >60)
GFR calc non Af Amer: >60 mL/min (ref >60)
GLUCOSE: 90 mg/dL (ref 65–99)
Potassium: 3.7 mmol/L (ref 3.5–5.1)
SODIUM: 138 mmol/L (ref 135–145)
Total Bilirubin: 0.4 mg/dL (ref 0.3–1.2)
Total Protein: 7 g/dL (ref 6.5–8.1)

## 2018-01-23 LAB — URINALYSIS, ROUTINE W REFLEX MICROSCOPIC
BILIRUBIN URINE: NEGATIVE
Glucose, UA: NEGATIVE mg/dL
HGB URINE DIPSTICK: NEGATIVE
Ketones, ur: NEGATIVE mg/dL
Leukocytes, UA: NEGATIVE
Nitrite: NEGATIVE
PH: 6 (ref 5.0–8.0)
Protein, ur: NEGATIVE mg/dL
SPECIFIC GRAVITY, URINE: 1.01 (ref 1.005–1.030)

## 2018-01-23 LAB — CBC
HEMATOCRIT: 44 % (ref 39.0–52.0)
HEMOGLOBIN: 14.9 g/dL (ref 13.0–17.0)
MCH: 27.6 pg (ref 26.0–34.0)
MCHC: 33.9 g/dL (ref 30.0–36.0)
MCV: 81.5 fL (ref 78.0–100.0)
Platelets: 294 10*3/uL (ref 150–400)
RBC: 5.4 MIL/uL (ref 4.22–5.81)
RDW: 15.3 % (ref 11.5–15.5)
WBC: 11.7 10*3/uL — ABNORMAL HIGH (ref 4.0–10.5)

## 2018-01-23 LAB — LIPASE, BLOOD: LIPASE: 36 U/L (ref 11–51)

## 2018-01-23 MED ORDER — IOPAMIDOL (ISOVUE-300) INJECTION 61%
100.0000 mL | Freq: Once | INTRAVENOUS | Status: AC | PRN
  Administered 2018-01-23: 100 mL via INTRAVENOUS

## 2018-01-23 MED ORDER — ONDANSETRON HCL 4 MG PO TABS: 4.0000 mg | ORAL_TABLET | Freq: Three times a day (TID) | ORAL | 0 refills | Status: DC | PRN

## 2018-01-23 MED ORDER — ONDANSETRON HCL 4 MG/2ML IJ SOLN
4.0000 mg | Freq: Once | INTRAMUSCULAR | Status: AC
  Administered 2018-01-23: 4 mg via INTRAVENOUS
  Filled 2018-01-23: qty 2

## 2018-01-23 MED ORDER — SUCRALFATE 1 G PO TABS: 1.0000 g | ORAL_TABLET | Freq: Three times a day (TID) | ORAL | 0 refills | Status: DC

## 2018-01-23 MED ORDER — MORPHINE SULFATE (PF) 4 MG/ML IV SOLN
4.0000 mg | Freq: Once | INTRAVENOUS | Status: AC
  Administered 2018-01-23: 4 mg via INTRAVENOUS
  Filled 2018-01-23: qty 1

## 2018-01-23 NOTE — ED Notes (Signed)
Patient transported to CT 

## 2018-01-23 NOTE — ED Triage Notes (Addendum)
Patient reports right flank pain which radiates into his abdomen and into the epigastric region.  States this began last Monday.  Denies dysuria, hematuria, vomiting, diarrhea, fevers.  Endorses nausea.

## 2018-01-23 NOTE — ED Provider Notes (Signed)
[facility] EMERGENCY DEPARTMENT Provider Note   CSN: [id number] Arrival date & time: 01/23/18  1748     History   Chief Complaint Chief Complaint  Patient presents with  . Abdominal Pain    HPI [name] is a 32 y.o. male.  The history is provided by the patient. No language interpreter was used.  Abdominal Pain      [name] is a 32 y.o. male who presents to the Emergency Department complaining of back pain/abdominal pain. Five days ago he developed right flank pain.  Two days ago pain began to radiate to his epigastrium. No known injuries.  Pain is sharp and pulling in nature - worse with movement.  No associated fevers, vomiting, leg swelling/pain. Has associted cough, nausea.  Sxs are worse with eating (if he over-eats).   Felt short of breath a few days ago - now gone. He did have lumbar spine surgery in October.  No hx/o DVT/PE.   Past Medical History:  Diagnosis Date  . ADHD (attention deficit hyperactivity disorder)   . Anxiety   . Bipolar disorder ([place])   . Chronic lower back pain   . Depression   . Esophagitis    Distal esophageal erosions consistent with mild erosive  reflux esophagitis 12/2008 by EGD   . Gastric ulcer   . GERD (gastroesophageal reflux disease)   . Hiatal hernia   . History of stomach ulcers   . Hx MRSA infection 8/08   right thigh  . Hypercholesteremia    denies  . Obesity   . Occult GI bleeding 12/2008   Trivial upper GI bleed/uncontrolled GERD by upper endoscopy 12/2008, normal f/u endoscopy 03/2009 with SBCE at that time  . OSA on CPAP   . Pain management   . Pneumonia 09/01/2015   "on ATB still" (09/04/2015)  . S/P colonoscopy 11/10, 10/08   Dr [name]  . Schizophrenia [facility])   . Thyroid function test abnormal    Noted in 2011 discharge  . Tobacco dipper     Patient Active Problem List   Diagnosis Date Noted  . 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 Wound drainage 09/04/2015  . Seasonal and  perennial allergic rhinitis 07/23/2014  . Abdominal mass of other site 06/15/2012  . Abdominal cramps 06/15/2012  . Obstructive sleep apnea 05/03/2012  . Syncope and collapse 05/03/2012  . Chest pain 05/03/2012  . Obesity, morbid ([place]) 05/03/2012  . Anal fissure 05/27/2011  . UNSPECIFIED ANEMIA 09/11/2009  . Blood in stool 04/06/2009  . ABDOMINAL PAIN OTHER SPECIFIED SITE 03/07/2009  . BIPOLAR DISORDER UNSPECIFIED 03/06/2009  . SMOKELESS TOBACCO ABUSE 03/06/2009  . ATTENTION DEFICIT HYPERACTIVITY DISORDER 03/06/2009  . GERD 03/06/2009  . NAUSEA AND VOMITING 03/06/2009  . DIARRHEA 03/06/2009    Past Surgical History:  Procedure Laterality Date  . ANKLE FRACTURE SURGERY Left ~ 2008  . BACK SURGERY    . COLONOSCOPY  10/10/2009   anal papilla otherwise normal  . ESOPHAGOGASTRODUODENOSCOPY   04/07/2009   Normal esophagus, small hiatal hernia  . ESOPHAGOGASTRODUODENOSCOPY  01/09/2009   Distal esophageal erosions consistent with mild erosive reflux esophagitis, otherwise normal esophagus, small hiatal herniaotherwise normal stomach, D1-D2   . ESOPHAGOGASTRODUODENOSCOPY  08/26/2007   Normal esophagus, a small hiatal/hernia, otherwise normal stomach D1 through D3  . FRACTURE SURGERY    . ileocolonoscopy  08/26/2007    A normal rectum, colon, and terminal ileum  . INCISION AND DRAINAGE  09/04/2015   "reopened my back  incsion"  . LUMBAR LAMINECTOMY/DECOMPRESSION MICRODISCECTOMY Left 09/23/2017   Procedure: Microdiscectomy - left - Lumbar four-Lumbar five;  Surgeon: Earnie Larsson, MD;  Location: [facility];  Service: Neurosurgery;  Laterality: Left;  . LUMBAR MICRODISCECTOMY  08/21/2015  . LUMBAR MICRODISCECTOMY Left 09/23/2017   L4-5  . LUMBAR WOUND DEBRIDEMENT N/A 09/04/2015   Procedure: LUMBAR WOUND DEBRIDEMENT;  Surgeon: Consuella Lose, MD;  Location: [facility];  Service: Neurosurgery;  Laterality: N/A;  . right side sugery     gland removed  . Small bowel capsule  04/11/2009     normal throughout  . VASECTOMY         Home Medications    Prior to Admission medications   Medication Sig Start Date End Date Taking? Authorizing Provider  lisdexamfetamine (VYVANSE) 30 MG capsule Take 30 mg by mouth daily.   Yes [provider]  albuterol (PROVENTIL HFA;VENTOLIN HFA) 108 (90 Base) MCG/ACT inhaler Inhale 1-2 puffs into the lungs every 6 (six) hours as needed for wheezing or shortness of breath. 11/26/16   Doristine Devoid, PA-C  amoxicillin-clavulanate (AUGMENTIN) 875-125 MG tablet Take 1 tablet by mouth 2 (two) times daily. 12/09/17   Sharion Balloon, FNP  cetirizine (ZYRTEC) 10 MG tablet Take 10 mg by mouth at bedtime.    [provider]  cetirizine (ZYRTEC) 10 MG tablet TAKE ONE TABLET BY MOUTH ONCE DAILY 12/22/17   Claretta Fraise, MD  cyclobenzaprine (FLEXERIL) 10 MG tablet Take 1 tablet (10 mg total) by mouth 3 (three) times daily as needed for muscle spasms. 07/27/17   Milton Ferguson, MD  diphenhydrAMINE (BENADRYL) 25 mg capsule Take 25 mg by mouth as needed for allergies.     [provider]  docusate sodium (COLACE) 100 MG capsule Take 100 mg by mouth every other day. Bedtime    [provider]  doxepin (SINEQUAN) 25 MG capsule Take 25 mg by mouth at bedtime.     [provider]  doxycycline (VIBRAMYCIN) 100 MG capsule Take 1 capsule (100 mg total) by mouth 2 (two) times daily. One po bid x 7 days 10/23/17   Julianne Rice, MD  EPINEPHrine 0.3 mg/0.3 mL IJ SOAJ injection Inject 0.3 mg into the muscle as needed (allergic reactions).     [provider]  gabapentin (NEURONTIN) 300 MG capsule Take 2 capsules (600 mg total) by mouth 3 (three) times daily. 07/29/17   Mesner, Corene Cornea, MD  hydrOXYzine (ATARAX/VISTARIL) 25 MG tablet Take 1-2 tablets (25-50 mg total) by mouth every 6 (six) hours as needed for itching (may cause drowsiness; do not take Zyrtec with this). 06/07/17   Molpus, John, MD  lamoTRIgine (LAMICTAL) 200 MG  tablet Take 200 mg by mouth at bedtime.    [provider]  mupirocin cream (BACTROBAN) 2 % Apply 1 application topically 2 (two) times daily. 08/24/17   Noemi Chapel, MD  ondansetron (ZOFRAN) 4 MG tablet Take 1 tablet (4 mg total) by mouth every 8 (eight) hours as needed for nausea or vomiting. 01/23/18   Quintella Reichert, MD  Oxycodone HCl 10 MG TABS Take 10 mg by mouth 3 (three) times daily as needed (pain).    [provider]  pantoprazole (PROTONIX) 40 MG tablet TAKE 1 TABLET BY MOUTH TWICE DAILY 11/05/17   Claretta Fraise, MD  sucralfate (CARAFATE) 1 g tablet Take 1 tablet (1 g total) by mouth 4 (four) times daily -  with meals and at bedtime. 01/23/18   Quintella Reichert, MD  Family History Family History  Problem Relation Age of Onset  . Leukemia Father 62  . Seizures Mother   . Bipolar disorder Brother   . ADD / ADHD Brother   . Colon cancer Neg Hx     Social History Social History   Tobacco Use  . Smoking status: Never Smoker  . Smokeless tobacco: Current User    Types: Chew  Substance Use Topics  . Alcohol use: No    Alcohol/week: 0.0 oz  . Drug use: No     Allergies   Ibuprofen; Influenza vaccines; Tylenol [acetaminophen]; Bee venom; and Tramadol   Review of Systems Review of Systems  Gastrointestinal: Positive for abdominal pain.  All other systems reviewed and are negative.    Physical Exam Updated Vital Signs BP (!) 130/56 (BP Location: Right Arm)   Pulse 80   Temp 97.9 F (36.6 C) (Oral)   Resp 20   Ht 6' (1.829 m)   Wt (!) 174.6 kg (385 lb)   SpO2 99%   BMI 52.22 kg/m   Physical Exam  Constitutional: He is oriented to person, place, and time. He appears well-developed and well-nourished.  HENT:  Head: Normocephalic and atraumatic.  Cardiovascular: Normal rate and regular rhythm.  No murmur heard. Pulmonary/Chest: Effort normal and breath sounds normal. No respiratory distress.  Abdominal: Soft. There is no rebound and no  guarding.  Moderate right sided abdominal tenderness  Musculoskeletal: He exhibits no edema or tenderness.  Neurological: He is alert and oriented to person, place, and time.  Skin: Skin is warm and dry.  Psychiatric: He has a normal mood and affect. His behavior is normal.  Nursing note and vitals reviewed.    ED Treatments / Results  Labs (all labs ordered are listed, but only abnormal results are displayed) Labs Reviewed  CBC - Abnormal; Notable for the following components:      Result Value   WBC 11.7 (*)    All other components within normal limits  LIPASE, BLOOD  COMPREHENSIVE METABOLIC PANEL  URINALYSIS, ROUTINE W REFLEX MICROSCOPIC    EKG  EKG Interpretation  Date/Time:  Friday January 23 2018 18:15:03 EST Ventricular Rate:  99 PR Interval:  144 QRS Duration: 92 QT Interval:  336 QTC Calculation: 431 R Axis:   36 Text Interpretation:  Normal sinus rhythm Normal ECG Confirmed by Quintella Reichert [phone]) on 01/23/2018 8:42:01 PM       Radiology Ct Abdomen Pelvis W Contrast  Result Date: 01/23/2018 CLINICAL DATA:  Nausea with right flank pain EXAM: CT ABDOMEN AND PELVIS WITH CONTRAST TECHNIQUE: Multidetector CT imaging of the abdomen and pelvis was performed using the standard protocol following bolus administration of intravenous contrast. CONTRAST:  12mL ISOVUE-300 IOPAMIDOL (ISOVUE-300) INJECTION 61% COMPARISON:  CT spine 11/17/2017, CT abdomen pelvis 07/29/2017 FINDINGS: Lower chest: Lung bases demonstrate no acute consolidation or pleural effusion. Normal heart size Hepatobiliary: No focal liver abnormality is seen. No gallstones, gallbladder wall thickening, or biliary dilatation. Enlarged liver up to 21 cm. Pancreas: Unremarkable. No pancreatic ductal dilatation or surrounding inflammatory changes. Spleen: Normal in size without focal abnormality. Adrenals/Urinary Tract: Adrenal glands are within normal limits. Kidneys show no hydronephrosis. Cyst in the midpole of  the left kidney. Additional subcentimeter hypodensities too small to further characterize but not significantly changed. Bladder normal Stomach/Bowel: Stomach is within normal limits. Appendix appears normal. No evidence of bowel wall thickening, distention, or inflammatory changes. Vascular/Lymphatic: Nonaneurysmal aorta. No significantly enlarged lymph nodes Reproductive: Prostate is unremarkable. Other:  Negative for free air or free fluid. Musculoskeletal: Degenerative changes of the spine. Left laminotomy at L4 and L5. Scarring and soft tissue thickening in the left posterior subcutaneous soft tissues. IMPRESSION: 1. No CT evidence for acute intra-abdominal or pelvic abnormality. 2. Stable hepatomegaly. Electronically Signed   By: Donavan Foil M.D.   On: 01/23/2018 21:25    Procedures Procedures (including critical care time)  Medications Ordered in ED Medications  morphine 4 MG/ML injection 4 mg (4 mg Intravenous Given 01/23/18 2100)  iopamidol (ISOVUE-300) 61 % injection 100 mL (100 mLs Intravenous Contrast Given 01/23/18 2103)  ondansetron (ZOFRAN) injection 4 mg (4 mg Intravenous Given 01/23/18 2139)     Initial Impression / Assessment and Plan / ED Course  I have reviewed the triage vital signs and the nursing notes.  Pertinent labs & imaging results that were available during my care of the patient were reviewed by me and considered in my medical decision making (see chart for details).     Patient with history of DDD, obesity here for evaluation of right flank as well as right-sided abdominal pain.  Pain is reproducible with activities but also has associated nausea and poor appetite.  He does have abdominal tenderness on examination with no peritoneal findings.  CT abdomen pelvis obtained with no evidence of obstructing stones, cholecystitis, pancreatitis, appendicitis.  He is feeling improved following treatment in the emergency department.  Discussed with patient home care for gastritis  versus muscle strain.  Discussed outpatient follow-up and return precautions.  Final Clinical Impressions(s) / ED Diagnoses   Final diagnoses:  Epigastric pain  Strain of mid-back, initial encounter    ED Discharge Orders        Ordered    ondansetron (ZOFRAN) 4 MG tablet  Every 8 hours PRN     01/23/18 2202    sucralfate (CARAFATE) 1 g tablet  3 times daily with meals & bedtime     01/23/18 2202       Quintella Reichert, MD 01/23/18 2352

## 2018-01-23 NOTE — ED Notes (Signed)
Pt returned from CT via stretcher.

## 2018-01-30 ENCOUNTER — Encounter (HOSPITAL_COMMUNITY): Payer: Self-pay | Admitting: Cardiology

## 2018-01-30 ENCOUNTER — Emergency Department (HOSPITAL_COMMUNITY)
Admission: EM | Admit: 2018-01-30 | Discharge: 2018-01-30 | Disposition: A | Discharge status: Home / Self Care | Payer: Medicaid Other | Attending: Emergency Medicine | Admitting: Emergency Medicine

## 2018-01-30 DIAGNOSIS — X58XXXA Exposure to other specified factors, initial encounter: Secondary | ICD-10-CM | POA: Diagnosis not present

## 2018-01-30 DIAGNOSIS — S0501XA Injury of conjunctiva and corneal abrasion without foreign body, right eye, initial encounter: Secondary | ICD-10-CM | POA: Diagnosis not present

## 2018-01-30 DIAGNOSIS — F17220 Nicotine dependence, chewing tobacco, uncomplicated: Secondary | ICD-10-CM | POA: Diagnosis not present

## 2018-01-30 DIAGNOSIS — Y929 Unspecified place or not applicable: Secondary | ICD-10-CM | POA: Diagnosis not present

## 2018-01-30 DIAGNOSIS — Y9389 Activity, other specified: Secondary | ICD-10-CM | POA: Diagnosis not present

## 2018-01-30 DIAGNOSIS — Z79899 Other long term (current) drug therapy: Secondary | ICD-10-CM | POA: Diagnosis not present

## 2018-01-30 DIAGNOSIS — Y998 Other external cause status: Secondary | ICD-10-CM | POA: Insufficient documentation

## 2018-01-30 MED ORDER — FLUORESCEIN SODIUM 1 MG OP STRP
1.0000 | ORAL_STRIP | Freq: Once | OPHTHALMIC | Status: AC
  Administered 2018-01-30: 1 via OPHTHALMIC
  Filled 2018-01-30: qty 1

## 2018-01-30 MED ORDER — TETRACAINE HCL 0.5 % OP SOLN
1.0000 [drp] | Freq: Once | OPHTHALMIC | Status: AC
  Administered 2018-01-30: 1 [drp] via OPHTHALMIC
  Filled 2018-01-30: qty 4

## 2018-01-30 MED ORDER — OFLOXACIN 0.3 % OP SOLN
1.0000 [drp] | Freq: Four times a day (QID) | OPHTHALMIC | Status: DC
  Administered 2018-01-30: 1 [drp] via OPHTHALMIC
  Filled 2018-01-30: qty 5

## 2018-01-30 NOTE — ED Triage Notes (Addendum)
Pt was putting out straw, lime and fertilizer today  and now having pain and blurred vision right eye Onset 30 minutes ago

## 2018-01-30 NOTE — ED Notes (Signed)
Eye exam  OD cannot see large E OS 20/20 OU 20/20   Sclera reddened and tearing   Pt reports throwing out grass sees as well as fertilizer when he felt like something in his eye- flushed it out and burning got worse

## 2018-01-30 NOTE — ED Provider Notes (Signed)
[facility] EMERGENCY DEPARTMENT Provider Note   CSN: [id number] Arrival date & time: 01/30/18  1354     History   Chief Complaint Chief Complaint  Patient presents with  . Eye Problem    HPI [name] is a 32 y.o. male.  HPI Patient presents with concern of foreign body sensation and blurriness in the right eye. The patient was contact lenses, works in Biomedical scientist. He notes that today, in route to work he felt some irritation, and removed his contact lens. He had a normal day until just after spreading grass seed, when he noticed discomfort in the right eye Pain was described as stinging, with a foreign body sensation, and there was blurry vision. Though he notes contact lens use, his eyes are otherwise unremarkable, no history of surgery, no history of prior similar sensation. He denies other complaints, was in his usual state of health until all of this occurred. No medication taken for pain relief, and no clear alleviating or exacerbating factors.  Past Medical History:  Diagnosis Date  . ADHD (attention deficit hyperactivity disorder)   . Anxiety   . Bipolar disorder ([place])   . Chronic lower back pain   . Depression   . Esophagitis    Distal esophageal erosions consistent with mild erosive  reflux esophagitis 12/2008 by EGD   . Gastric ulcer   . GERD (gastroesophageal reflux disease)   . Hiatal hernia   . History of stomach ulcers   . Hx MRSA infection 8/08   right thigh  . Hypercholesteremia    denies  . Obesity   . Occult GI bleeding 12/2008   Trivial upper GI bleed/uncontrolled GERD by upper endoscopy 12/2008, normal f/u endoscopy 03/2009 with SBCE at that time  . OSA on CPAP   . Pain management   . Pneumonia 09/01/2015   "on ATB still" (09/04/2015)  . S/P colonoscopy 11/10, 10/08   Dr [name]  . Schizophrenia [facility])   . Thyroid function test abnormal    Noted in 2011 discharge  . Tobacco dipper     Patient Active Problem List   Diagnosis  Date Noted  . 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 Wound drainage 09/04/2015  . Seasonal and perennial allergic rhinitis 07/23/2014  . Abdominal mass of other site 06/15/2012  . Abdominal cramps 06/15/2012  . Obstructive sleep apnea 05/03/2012  . Syncope and collapse 05/03/2012  . Chest pain 05/03/2012  . Obesity, morbid ([place]) 05/03/2012  . Anal fissure 05/27/2011  . UNSPECIFIED ANEMIA 09/11/2009  . Blood in stool 04/06/2009  . ABDOMINAL PAIN OTHER SPECIFIED SITE 03/07/2009  . BIPOLAR DISORDER UNSPECIFIED 03/06/2009  . SMOKELESS TOBACCO ABUSE 03/06/2009  . ATTENTION DEFICIT HYPERACTIVITY DISORDER 03/06/2009  . GERD 03/06/2009  . NAUSEA AND VOMITING 03/06/2009  . DIARRHEA 03/06/2009    Past Surgical History:  Procedure Laterality Date  . ANKLE FRACTURE SURGERY Left ~ 2008  . BACK SURGERY    . COLONOSCOPY  10/10/2009   anal papilla otherwise normal  . ESOPHAGOGASTRODUODENOSCOPY   04/07/2009   Normal esophagus, small hiatal hernia  . ESOPHAGOGASTRODUODENOSCOPY  01/09/2009   Distal esophageal erosions consistent with mild erosive reflux esophagitis, otherwise normal esophagus, small hiatal herniaotherwise normal stomach, D1-D2   . ESOPHAGOGASTRODUODENOSCOPY  08/26/2007   Normal esophagus, a small hiatal/hernia, otherwise normal stomach D1 through D3  . FRACTURE SURGERY    . ileocolonoscopy  08/26/2007    A normal rectum, colon, and terminal ileum  . INCISION AND DRAINAGE  09/04/2015   "  reopened my back incsion"  . LUMBAR LAMINECTOMY/DECOMPRESSION MICRODISCECTOMY Left 09/23/2017   Procedure: Microdiscectomy - left - Lumbar four-Lumbar five;  Surgeon: Earnie Larsson, MD;  Location: [facility];  Service: Neurosurgery;  Laterality: Left;  . LUMBAR MICRODISCECTOMY  08/21/2015  . LUMBAR MICRODISCECTOMY Left 09/23/2017   L4-5  . LUMBAR WOUND DEBRIDEMENT N/A 09/04/2015   Procedure: LUMBAR WOUND DEBRIDEMENT;  Surgeon: Consuella Lose, MD;  Location: [facility];  Service: Neurosurgery;   Laterality: N/A;  . right side sugery     gland removed  . Small bowel capsule  04/11/2009    normal throughout  . VASECTOMY         Home Medications    Prior to Admission medications   Medication Sig Start Date End Date Taking? Authorizing Provider  albuterol (PROVENTIL HFA;VENTOLIN HFA) 108 (90 Base) MCG/ACT inhaler Inhale 1-2 puffs into the lungs every 6 (six) hours as needed for wheezing or shortness of breath. 11/26/16  Yes Leaphart, Zack Seal, PA-C  cetirizine (ZYRTEC) 10 MG tablet TAKE ONE TABLET BY MOUTH ONCE DAILY 12/22/17  Yes Claretta Fraise, MD  cyclobenzaprine (FLEXERIL) 10 MG tablet Take 1 tablet (10 mg total) by mouth 3 (three) times daily as needed for muscle spasms. 07/27/17  Yes Milton Ferguson, MD  docusate sodium (COLACE) 100 MG capsule Take 100 mg by mouth daily as needed for mild constipation. Bedtime    Yes [provider]  doxepin (SINEQUAN) 25 MG capsule Take 25 mg by mouth at bedtime.    Yes [provider]  EPINEPHrine 0.3 mg/0.3 mL IJ SOAJ injection Inject 0.3 mg into the muscle as needed (allergic reactions).    Yes [provider]  gabapentin (NEURONTIN) 600 MG tablet Take 600 mg by mouth 3 (three) times daily.   Yes [provider]  hydrOXYzine (ATARAX/VISTARIL) 25 MG tablet Take 1-2 tablets (25-50 mg total) by mouth every 6 (six) hours as needed for itching (may cause drowsiness; do not take Zyrtec with this). Patient taking differently: Take 25 mg by mouth at bedtime as needed for itching (may cause drowsiness; do not take Zyrtec with this).  06/07/17  Yes Molpus, John, MD  lamoTRIgine (LAMICTAL) 200 MG tablet Take 200 mg by mouth at bedtime.   Yes [provider]  lisdexamfetamine (VYVANSE) 30 MG capsule Take 30 mg by mouth daily.   Yes [provider]  ondansetron (ZOFRAN) 4 MG tablet Take 1 tablet (4 mg total) by mouth every 8 (eight) hours as needed for nausea or vomiting. 01/23/18  Yes Quintella Reichert, MD    pantoprazole (PROTONIX) 40 MG tablet TAKE 1 TABLET BY MOUTH TWICE DAILY 11/05/17  Yes Claretta Fraise, MD  sucralfate (CARAFATE) 1 g tablet Take 1 tablet (1 g total) by mouth 4 (four) times daily -  with meals and at bedtime. 01/23/18  Yes Quintella Reichert, MD  amoxicillin-clavulanate (AUGMENTIN) 875-125 MG tablet Take 1 tablet by mouth 2 (two) times daily. Patient not taking: Reported on 01/30/2018 12/09/17   Evelina Dun A, FNP  doxycycline (VIBRAMYCIN) 100 MG capsule Take 1 capsule (100 mg total) by mouth 2 (two) times daily. One po bid x 7 days Patient not taking: Reported on 01/30/2018 10/23/17   Julianne Rice, MD  gabapentin (NEURONTIN) 300 MG capsule Take 2 capsules (600 mg total) by mouth 3 (three) times daily. Patient not taking: Reported on 01/30/2018 07/29/17   Mesner, Corene Cornea, MD  mupirocin cream (BACTROBAN) 2 % Apply 1 application topically 2 (two) times daily. Patient not  taking: Reported on 01/30/2018 08/24/17   Noemi Chapel, MD    Family History Family History  Problem Relation Age of Onset  . Leukemia Father 4  . Seizures Mother   . Bipolar disorder Brother   . ADD / ADHD Brother   . Colon cancer Neg Hx     Social History Social History   Tobacco Use  . Smoking status: Never Smoker  . Smokeless tobacco: Current User    Types: Chew  Substance Use Topics  . Alcohol use: No    Alcohol/week: 0.0 oz  . Drug use: No     Allergies   Ibuprofen; Influenza vaccines; Tylenol [acetaminophen]; Bee venom; and Tramadol   Review of Systems Review of Systems  Constitutional: Negative for fever.  Respiratory: Negative for shortness of breath.   Cardiovascular: Negative for chest pain.  Musculoskeletal:       Negative aside from HPI  Skin:       Negative aside from HPI  Allergic/Immunologic: Negative for immunocompromised state.  Neurological: Negative for weakness.     Physical Exam Updated Vital Signs BP 139/75 (BP Location: Right Arm)   Pulse (!) 124   Temp 98.2 F  (36.8 C) (Oral)   Resp 18   Ht 6' (1.829 m)   Wt (!) 174.6 kg (385 lb)   SpO2 100%   BMI 52.22 kg/m   Physical Exam  Constitutional: He is oriented to person, place, and time. He appears well-developed. No distress.  HENT:  Head: Normocephalic and atraumatic.  Eyes: EOM are normal. Right eye exhibits no chemosis, no discharge, no exudate and no hordeolum. No foreign body present in the right eye. Left eye exhibits no chemosis. Right conjunctiva is injected. Right conjunctiva has no hemorrhage. No scleral icterus. Right eye exhibits normal extraocular motion and no nystagmus. Left eye exhibits normal extraocular motion and no nystagmus. Right pupil is round and reactive. Left pupil is round and reactive.  Slit lamp exam:      The right eye shows corneal abrasion and fluorescein uptake. The right eye shows no foreign body.    Cardiovascular: Normal rate and regular rhythm.  Pulmonary/Chest: Effort normal. No stridor. No respiratory distress.  Musculoskeletal: He exhibits no deformity.  Neurological: He is alert and oriented to person, place, and time.  Skin: Skin is warm and dry.  Psychiatric: He has a normal mood and affect.  Nursing note and vitals reviewed.    ED Treatments / Results   Procedures Procedures (including critical care time)  Medications Ordered in ED Medications  ofloxacin (OCUFLOX) 0.3 % ophthalmic solution 1 drop (not administered)  tetracaine (PONTOCAINE) 0.5 % ophthalmic solution 1 drop (1 drop Left Eye Given 01/30/18 1453)  fluorescein ophthalmic strip 1 strip (1 strip Both Eyes Given 01/30/18 1453)     Initial Impression / Assessment and Plan / ED Course  I have reviewed the triage vital signs and the nursing notes.  Pertinent labs & imaging results that were available during my care of the patient were reviewed by me and considered in my medical decision making (see chart for details).  They will young male presents with right eye pain, foreign body  sensation, visual disturbance. Patient found a corneal abrasion on slit-lamp exam. Patient has no other complaints, and had resolution after provision of topical tetracaine. Patient discharged in stable condition with antibiotic therapy, and outpatient ophthalmology follow-up.  Final Clinical Impressions(s) / ED Diagnoses   Final diagnoses:  Abrasion of right cornea, initial encounter  ED Discharge Orders    None       Carmin Muskrat, MD 01/30/18 [phone]

## 2018-01-30 NOTE — Discharge Instructions (Signed)
Please use the provided ofloxacin eyedrops 4 times daily.  In addition, the pain medication may be used up to 3 times daily.  It is important that you follow-up with your ophthalmologist, and return here for concerning changes in your condition.

## 2018-02-03 ENCOUNTER — Other Ambulatory Visit: Payer: Self-pay | Admitting: Family Medicine

## 2018-02-03 DIAGNOSIS — K219 Gastro-esophageal reflux disease without esophagitis: Secondary | ICD-10-CM

## 2018-03-04 ENCOUNTER — Telehealth: Payer: Self-pay | Admitting: Internal Medicine

## 2018-03-04 DIAGNOSIS — G4733 Obstructive sleep apnea (adult) (pediatric): Secondary | ICD-10-CM

## 2018-03-04 NOTE — Telephone Encounter (Signed)
Patient calling stating he now has DOT forms straightened out.  He does state that his CPAP machine is not working right and he contacted [facility] and was told to call our office about getting an order for new CPAP.  CB is [phone].

## 2018-03-04 NOTE — Telephone Encounter (Signed)
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 Pt is not in AV and states no SD card in his CPAP machine. Spoke with Melissa at [facility] and she has arranged for patient to go by and get DL today STAT and take copy with him. Pt will go by the [address] store. Nothing more needed at this time.

## 2018-03-04 NOTE — Telephone Encounter (Signed)
Spoke with patient. He was returning [name]'s call in regards to his DOT forms. He stated that his machine is not working and he called to [facility]. [facility] advised him to call our office in regards to getting a new CPAP machine.   There is not a download available in AirView due to the machine being broken and the patient not using it.   Dr. Annamaria Boots, please advise if it is ok to order a new CPAP machine. Thanks!

## 2018-03-04 NOTE — Telephone Encounter (Signed)
Pt is calling with questions about DOT form. Per Pt, he just left office but still has questions. Cb is [phone].

## 2018-03-04 NOTE — Telephone Encounter (Signed)
Pt states that the urgent care physician needs CPAP DL  Per [name] transferred call to her

## 2018-03-05 NOTE — Telephone Encounter (Signed)
Attempted to call pt but no answer. Left message for pt to return our call x1.  Will await a return call from pt before placing order to [facility].

## 2018-03-05 NOTE — Telephone Encounter (Signed)
Ok order [facility]  Please replace old, broken CPAP machine- change to auto 10-20, continue mask of choice, humidifier, AirView    Dx OSA He should have a return ov in 31-90 days

## 2018-03-06 ENCOUNTER — Telehealth: Payer: Self-pay | Admitting: Internal Medicine

## 2018-03-06 NOTE — Telephone Encounter (Signed)
Spoke with pt letting him know we were sending an order to [facility] to replace old cpap. Pt expressed understanding that this was being taken care of.  Scheduled OV with SG for a f/u after receiving new machine.  Nothing further needed at this time.

## 2018-03-06 NOTE — Telephone Encounter (Signed)
Left message for patient to call back to get scheduled for an appt. Patient has not been seen since 02/2016.

## 2018-03-06 NOTE — Addendum Note (Signed)
Addended by: Lorretta Harp on: 03/06/2018 10:12 AM   Modules accepted: Orders

## 2018-03-09 NOTE — Telephone Encounter (Signed)
Spoke with [name]. He has been scheduled with Tammy Parrett on 03/11/18 at 9am. Nothing further was needed at this time.

## 2018-03-11 ENCOUNTER — Encounter: Payer: Self-pay | Admitting: Adult Health

## 2018-03-11 ENCOUNTER — Ambulatory Visit: Payer: Medicaid Other | Admitting: Adult Health

## 2018-03-11 DIAGNOSIS — G4733 Obstructive sleep apnea (adult) (pediatric): Secondary | ICD-10-CM | POA: Diagnosis not present

## 2018-03-11 NOTE — Addendum Note (Signed)
Addended by: Parke Poisson E on: 03/11/2018 09:54 AM   Modules accepted: Orders

## 2018-03-11 NOTE — Assessment & Plan Note (Signed)
Restart CPAP   Plan  Patient Instructions  Order for new CPAP At bedtime .  Restart CPAP At bedtime  .  Wear for at least 4 hours each night  Work on healthy weight  Do not drive if sleepy  Follow up with Dr. Annamaria Boots  In 3 months and As needed

## 2018-03-11 NOTE — Patient Instructions (Signed)
Order for new CPAP At bedtime .  Restart CPAP At bedtime  .  Wear for at least 4 hours each night  Work on healthy weight  Do not drive if sleepy  Follow up with Dr. Annamaria Boots  In 3 months and As needed

## 2018-03-11 NOTE — Assessment & Plan Note (Signed)
Wt loss  

## 2018-03-11 NOTE — Progress Notes (Signed)
@Patient  ID: [name], male    DOB: April 30, 1986, 32 y.o.   MRN: [id number]  Chief Complaint  Patient presents with  . Follow-up    OSA     Referring provider: Claretta Fraise, MD  HPI: 32 year old male followed for sleep apnea Past medical history significant for bipolar disease and morbid obesity  TEST  NPSG 08/03/12-  AHI 24.1/ hr, moderate OSA.  03/11/2018 Follow up ; OSA  Patient returns for a follow-up visit for sleep apnea.  Patient was last seen in the office in April 2017.  Patient says he has not been wearing his CPAP recently.  Has probably not worn it for greater than a year.  Says his machine is not working correctly. He needs a new machine .  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 Has snoring and some restless sleep . No daytime sleepiness.    Allergies  Allergen Reactions  . Ibuprofen Other (See Comments)    Makes ulcers bleed  . Influenza Vaccines Shortness Of Breath and Other (See Comments)    Rash and unable to breathe well  . Tylenol [Acetaminophen] Other (See Comments)    Bleeding ulcers  . Bee Venom Swelling    SWELLING REACTION UNSPECIFIED   . Tramadol Itching    Immunization History  Administered Date(s) Administered  . Influenza Split 08/25/2010, 08/20/2012, 08/25/2014    Past Medical History:  Diagnosis Date  . ADHD (attention deficit hyperactivity disorder)   . Anxiety   . Bipolar disorder ([place])   . Chronic lower back pain   . Depression   . Esophagitis    Distal esophageal erosions consistent with mild erosive  reflux esophagitis 12/2008 by EGD   . Gastric ulcer   . GERD (gastroesophageal reflux disease)   . Hiatal hernia   . History of stomach ulcers   . Hx MRSA infection 8/08   right thigh  . Hypercholesteremia    denies  . Obesity   . Occult GI bleeding 12/2008   Trivial upper GI bleed/uncontrolled GERD by upper endoscopy 12/2008, normal f/u endoscopy 03/2009 with SBCE at that time  . OSA on CPAP   . Pain management   .  Pneumonia 09/01/2015   "on ATB still" (09/04/2015)  . S/P colonoscopy 11/10, 10/08   Dr [name]  . Schizophrenia [facility])   . Thyroid function test abnormal    Noted in 2011 discharge  . Tobacco dipper     Tobacco History: Social History   Tobacco Use  Smoking Status Never Smoker  Smokeless Tobacco Current User  . Types: Chew   Ready to quit: Not Answered Counseling given: Not Answered   Outpatient Encounter Medications as of 03/11/2018  Medication Sig  . albuterol (PROVENTIL HFA;VENTOLIN HFA) 108 (90 Base) MCG/ACT inhaler Inhale 1-2 puffs into the lungs every 6 (six) hours as needed for wheezing or shortness of breath.  [name] Kitchen amoxicillin-clavulanate (AUGMENTIN) 875-125 MG tablet Take 1 tablet by mouth 2 (two) times daily.  . cetirizine (ZYRTEC) 10 MG tablet TAKE ONE TABLET BY MOUTH ONCE DAILY  . cyclobenzaprine (FLEXERIL) 10 MG tablet Take 1 tablet (10 mg total) by mouth 3 (three) times daily as needed for muscle spasms.  [name] Kitchen docusate sodium (COLACE) 100 MG capsule Take 100 mg by mouth daily as needed for mild constipation. Bedtime   . doxepin (SINEQUAN) 25 MG capsule Take 25 mg by mouth at bedtime.   [name] Kitchen doxycycline (VIBRAMYCIN) 100 MG capsule Take 1 capsule (100 mg total) by mouth 2 (  two) times daily. One po bid x 7 days  . EPINEPHrine 0.3 mg/0.3 mL IJ SOAJ injection Inject 0.3 mg into the muscle as needed (allergic reactions).   . gabapentin (NEURONTIN) 300 MG capsule Take 2 capsules (600 mg total) by mouth 3 (three) times daily.  [name] Kitchen gabapentin (NEURONTIN) 600 MG tablet Take 600 mg by mouth 3 (three) times daily.  . hydrOXYzine (ATARAX/VISTARIL) 25 MG tablet Take 1-2 tablets (25-50 mg total) by mouth every 6 (six) hours as needed for itching (may cause drowsiness; do not take Zyrtec with this). (Patient taking differently: Take 25 mg by mouth at bedtime as needed for itching (may cause drowsiness; do not take Zyrtec with this). )  . lamoTRIgine (LAMICTAL) 200 MG tablet Take 200 mg by  mouth at bedtime.  [name] Kitchen lisdexamfetamine (VYVANSE) 60 MG capsule Take 60 mg by mouth daily.   . mupirocin cream (BACTROBAN) 2 % Apply 1 application topically 2 (two) times daily.  . ondansetron (ZOFRAN) 4 MG tablet Take 1 tablet (4 mg total) by mouth every 8 (eight) hours as needed for nausea or vomiting.  . pantoprazole (PROTONIX) 40 MG tablet TAKE 1 TABLET BY MOUTH TWICE DAILY  . sucralfate (CARAFATE) 1 g tablet Take 1 tablet (1 g total) by mouth 4 (four) times daily -  with meals and at bedtime.   No facility-administered encounter medications on file as of 03/11/2018.      Review of Systems  Constitutional:   No  weight loss, night sweats,  Fevers, chills, fatigue, or  lassitude.  HEENT:   No headaches,  Difficulty swallowing,  Tooth/dental problems, or  Sore throat,                No sneezing, itching, ear ache, nasal congestion, post nasal drip,   CV:  No chest pain,  Orthopnea, PND, swelling in lower extremities, anasarca, dizziness, palpitations, syncope.   GI  No heartburn, indigestion, abdominal pain, nausea, vomiting, diarrhea, change in bowel habits, loss of appetite, bloody stools.   Resp: No shortness of breath with exertion or at rest.  No excess mucus, no productive cough,  No non-productive cough,  No coughing up of blood.  No change in color of mucus.  No wheezing.  No chest wall deformity  Skin: no rash or lesions.  GU: no dysuria, change in color of urine, no urgency or frequency.  No flank pain, no hematuria   MS:  No joint pain or swelling.  No decreased range of motion.  No back pain.    Physical Exam  BP 118/64 (BP Location: Left Arm, Cuff Size: Normal)   Pulse (!) 102   Ht 6' (1.829 m)   Wt (!) 414 lb (187.8 kg)   SpO2 98%   BMI 56.15 kg/m   GEN: A/Ox3; pleasant , NAD, morbidly obese    HEENT:  [place]/AT,  EACs-clear, TMs-wnl, NOSE-clear, THROAT-clear, no lesions, no postnasal drip or exudate noted. Poor dentition , Class 3 MP airway   NECK:  Supple w/  fair ROM; no JVD; normal carotid impulses w/o bruits; no thyromegaly or nodules palpated; no lymphadenopathy.    RESP  Clear  P & A; w/o, wheezes/ rales/ or rhonchi. no accessory muscle use, no dullness to percussion  CARD:  RRR, no m/r/g, no peripheral edema, pulses intact, no cyanosis or clubbing.  GI:   Soft & nt; nml bowel sounds; no organomegaly or masses detected.   Musco: Warm bil, no deformities or joint swelling noted.  Neuro: alert, no focal deficits noted.    Skin: Warm, no lesions or rashes    Lab Results:  CBC  BNP No results found for: BNP  ProBNP No results found for: PROBNP  Imaging: No results found.   Assessment & Plan:   Obstructive sleep apnea Restart CPAP   Plan  Patient Instructions  Order for new CPAP At bedtime .  Restart CPAP At bedtime  .  Wear for at least 4 hours each night  Work on healthy weight  Do not drive if sleepy  Follow up with Dr. Annamaria Boots  In 3 months and As needed        Obesity, morbid ([place]) Wt loss      Rexene Edison, NP 03/11/2018

## 2018-03-12 ENCOUNTER — Telehealth: Payer: Self-pay | Admitting: Internal Medicine

## 2018-03-12 DIAGNOSIS — G4733 Obstructive sleep apnea (adult) (pediatric): Secondary | ICD-10-CM

## 2018-03-12 NOTE — Telephone Encounter (Signed)
Okay , can set up for Home sleep study  Let pt know bc he is expecting to restart due to [place] license

## 2018-03-12 NOTE — Telephone Encounter (Signed)
Spoke with pt and advised him that he would need another sleep study before he can receive a replacement CPAP. Pt understood and HST ordered.

## 2018-03-12 NOTE — Telephone Encounter (Signed)
Chantel stated pt cannot do a HST because he has Medicaid. TP do you want to do a split night? Pleasse advise.

## 2018-03-12 NOTE — Telephone Encounter (Signed)
Called and spoke to [place] with [facility]. Barbaraann Rondo states that pt will need sleep study and face to face prior to cpap replacement, due to break in therapy.  TP please advise on HST or Split night. Thanks

## 2018-03-12 NOTE — Addendum Note (Signed)
Addended by: Jannette Spanner on: 03/12/2018 12:32 PM   Modules accepted: Orders

## 2018-03-17 NOTE — Addendum Note (Signed)
Addended by: Len Blalock on: 03/17/2018 02:13 PM   Modules accepted: Orders

## 2018-03-17 NOTE — Telephone Encounter (Signed)
Split night study ordered.  Pt aware.  Nothing further needed.

## 2018-03-17 NOTE — Telephone Encounter (Signed)
Per TP: okay for split night.  Thank you.

## 2018-03-25 ENCOUNTER — Ambulatory Visit: Payer: Medicaid Other | Attending: Adult Health | Admitting: Pulmonary Disease

## 2018-03-25 DIAGNOSIS — G4733 Obstructive sleep apnea (adult) (pediatric): Secondary | ICD-10-CM | POA: Insufficient documentation

## 2018-03-27 DIAGNOSIS — G4733 Obstructive sleep apnea (adult) (pediatric): Secondary | ICD-10-CM | POA: Diagnosis not present

## 2018-03-27 NOTE — Procedures (Signed)
Patient Name: [name], [name] Date: 03/25/2018 Gender: Male D.O.B: [birth year] Age (years): 32 Referring Provider: Tammy Parrett Height (inches): 71 Interpreting Physician: Kara Mead MD, ABSM Weight (lbs): 414 RPSGT: Peak, Robert BMI: 58 MRN: [id number] Neck Size: 22.00 <br> <br> CLINICAL INFORMATION Sleep Study Type: NPSG    Indication for sleep study: OSA for reassessment ???????NPSG 08/03/12-  AHI 24.1/ hr, moderate OSA.  Epworth Sleepiness Score: 5  SLEEP STUDY TECHNIQUE As per the AASM Manual for the Scoring of Sleep and Associated Events v2.3 (April 2016) with a hypopnea requiring 4% desaturations.  The channels recorded and monitored were frontal, central and occipital EEG, electrooculogram (EOG), submentalis EMG (chin), nasal and oral airflow, thoracic and abdominal wall motion, anterior tibialis EMG, snore microphone, electrocardiogram, and pulse oximetry.  MEDICATIONS Medications self-administered by patient taken the night of the study : N/A  SLEEP ARCHITECTURE The study was initiated at 10:06:34 PM and ended at 4:17:58 AM.  Sleep onset time was 2.1 minutes and the sleep efficiency was 92.3%%. The total sleep time was 342.8 minutes.  Stage REM latency was 104.0 minutes.  The patient spent 5.8%% of the night in stage N1 sleep, 69.4%% in stage N2 sleep, 0.0%% in stage N3 and 24.80% in REM.  Alpha intrusion was absent.  Supine sleep was 65.53%.  RESPIRATORY PARAMETERS The overall apnea/hypopnea index (AHI) was 19.1 per hour. There were 15 total apneas, including 15 obstructive, 0 central and 0 mixed apneas. There were 94 hypopneas and 9 RERAs.  The AHI during Stage REM sleep was 59.3 per hour.  AHI while supine was 16.0 per hour.  The mean oxygen saturation was 92.9%. The minimum SpO2 during sleep was 73.0%.  loud snoring was noted during this study.  CARDIAC DATA The 2 lead EKG demonstrated sinus rhythm. The mean heart rate was 83.3 beats per  minute. Other EKG findings include: None. LEG MOVEMENT DATA The total PLMS were 0 with a resulting PLMS index of 0.0. Associated arousal with leg movement index was 0.0 .  IMPRESSIONS - Mild obstructive sleep apnea occurred during this study (AHI = 19.1/h). - No significant central sleep apnea occurred during this study (CAI = 0.0/h). - Moderate oxygen desaturation was noted during this study (Min O2 = 73.0%). - The patient snored with loud snoring volume. - No cardiac abnormalities were noted during this study. - Clinically significant periodic limb movements did not occur during sleep. No significant associated arousals.   DIAGNOSIS - Obstructive Sleep Apnea (327.23 [G47.33 ICD-10])   RECOMMENDATIONS - Therapeutic CPAP titration to determine optimal pressure required to alleviate sleep disordered breathing. - Avoid alcohol, sedatives and other CNS depressants that may worsen sleep apnea and disrupt normal sleep architecture. - Sleep hygiene should be reviewed to assess factors that may improve sleep quality. - Weight management and regular exercise should be initiated or continued if appropriate.   Kara Mead MD Board Certified in [place]

## 2018-03-30 ENCOUNTER — Other Ambulatory Visit: Payer: Self-pay

## 2018-03-30 DIAGNOSIS — G4733 Obstructive sleep apnea (adult) (pediatric): Secondary | ICD-10-CM

## 2018-04-28 ENCOUNTER — Emergency Department (HOSPITAL_COMMUNITY)
Admission: EM | Admit: 2018-04-28 | Discharge: 2018-04-28 | Disposition: A | Discharge status: Home / Self Care | Payer: Medicaid Other | Attending: Emergency Medicine | Admitting: Emergency Medicine

## 2018-04-28 ENCOUNTER — Other Ambulatory Visit: Payer: Self-pay

## 2018-04-28 ENCOUNTER — Encounter (HOSPITAL_COMMUNITY): Payer: Self-pay | Admitting: Emergency Medicine

## 2018-04-28 DIAGNOSIS — J209 Acute bronchitis, unspecified: Secondary | ICD-10-CM | POA: Diagnosis not present

## 2018-04-28 DIAGNOSIS — Z79899 Other long term (current) drug therapy: Secondary | ICD-10-CM | POA: Diagnosis not present

## 2018-04-28 DIAGNOSIS — F17220 Nicotine dependence, chewing tobacco, uncomplicated: Secondary | ICD-10-CM | POA: Insufficient documentation

## 2018-04-28 DIAGNOSIS — R05 Cough: Secondary | ICD-10-CM | POA: Diagnosis present

## 2018-04-28 MED ORDER — AZITHROMYCIN 250 MG PO TABS: 250.0000 mg | ORAL_TABLET | Freq: Every day | ORAL | 0 refills | Status: DC

## 2018-04-28 MED ORDER — BENZONATATE 100 MG PO CAPS: 100.0000 mg | ORAL_CAPSULE | Freq: Three times a day (TID) | ORAL | 0 refills | Status: DC | PRN

## 2018-04-28 MED ORDER — AZITHROMYCIN 250 MG PO TABS
500.0000 mg | ORAL_TABLET | Freq: Once | ORAL | Status: AC
  Administered 2018-04-28: 500 mg via ORAL
  Filled 2018-04-28: qty 2

## 2018-04-28 NOTE — ED Triage Notes (Signed)
Pt c/o productive cough and headache x 2 weeks.

## 2018-04-28 NOTE — Discharge Instructions (Addendum)
Your evaluated in the emergency department for 2 weeks of cough and sore throat.  We are covering you with some antibiotics for possible bacterial bronchitis.  We are also prescribing you medication to help with cough.  You should keep well-hydrated and follow-up with your primary care doctor for further evaluation.

## 2018-04-28 NOTE — ED Provider Notes (Signed)
[facility] EMERGENCY DEPARTMENT Provider Note   CSN: [id number] Arrival date & time: 04/28/18  2044     History   Chief Complaint Chief Complaint  Patient presents with  . Cough    HPI [name] is a 32 y.o. male.  He presents emergency department today with complaint of cough productive of brown sputum along with hoarse voice and sore throat is been going on for 2 weeks.  He denies any fever.  His cough causes him to feel short of breath at times.  There is no chest pain.  He has been trying over-the-counter medicines without any relief.  The history is provided by the patient.  Cough  This is a new problem. The current episode started more than 1 week ago. The problem occurs every few minutes. The cough is productive of brown sputum. There has been no fever. Associated symptoms include headaches, rhinorrhea and sore throat. Pertinent negatives include no chest pain, no chills, no sweats, no ear pain, no myalgias and no shortness of breath. He has tried nothing for the symptoms. The treatment provided no relief. He is not a smoker. His past medical history is significant for bronchitis and pneumonia.    Past Medical History:  Diagnosis Date  . ADHD (attention deficit hyperactivity disorder)   . Anxiety   . Bipolar disorder ([place])   . Chronic lower back pain   . Depression   . Esophagitis    Distal esophageal erosions consistent with mild erosive  reflux esophagitis 12/2008 by EGD   . Gastric ulcer   . GERD (gastroesophageal reflux disease)   . Hiatal hernia   . History of stomach ulcers   . Hx MRSA infection 8/08   right thigh  . Hypercholesteremia    denies  . Obesity   . Occult GI bleeding 12/2008   Trivial upper GI bleed/uncontrolled GERD by upper endoscopy 12/2008, normal f/u endoscopy 03/2009 with SBCE at that time  . OSA on CPAP   . Pain management   . Pneumonia 09/01/2015   "on ATB still" (09/04/2015)  . S/P colonoscopy 11/10, 10/08   Dr [name]  .  Schizophrenia [facility])   . Thyroid function test abnormal    Noted in 2011 discharge  . Tobacco dipper     Patient Active Problem List   Diagnosis Date Noted  . 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 Wound drainage 09/04/2015  . Seasonal and perennial allergic rhinitis 07/23/2014  . Abdominal mass of other site 06/15/2012  . Abdominal cramps 06/15/2012  . Obstructive sleep apnea 05/03/2012  . Syncope and collapse 05/03/2012  . Chest pain 05/03/2012  . Obesity, morbid ([place]) 05/03/2012  . Anal fissure 05/27/2011  . UNSPECIFIED ANEMIA 09/11/2009  . Blood in stool 04/06/2009  . ABDOMINAL PAIN OTHER SPECIFIED SITE 03/07/2009  . BIPOLAR DISORDER UNSPECIFIED 03/06/2009  . SMOKELESS TOBACCO ABUSE 03/06/2009  . ATTENTION DEFICIT HYPERACTIVITY DISORDER 03/06/2009  . GERD 03/06/2009  . NAUSEA AND VOMITING 03/06/2009  . DIARRHEA 03/06/2009    Past Surgical History:  Procedure Laterality Date  . ANKLE FRACTURE SURGERY Left ~ 2008  . BACK SURGERY    . COLONOSCOPY  10/10/2009   anal papilla otherwise normal  . ESOPHAGOGASTRODUODENOSCOPY   04/07/2009   Normal esophagus, small hiatal hernia  . ESOPHAGOGASTRODUODENOSCOPY  01/09/2009   Distal esophageal erosions consistent with mild erosive reflux esophagitis, otherwise normal esophagus, small hiatal herniaotherwise normal stomach, D1-D2   . ESOPHAGOGASTRODUODENOSCOPY  08/26/2007   Normal esophagus, a small hiatal/hernia,  otherwise normal stomach D1 through D3  . FRACTURE SURGERY    . ileocolonoscopy  08/26/2007    A normal rectum, colon, and terminal ileum  . INCISION AND DRAINAGE  09/04/2015   "reopened my back incsion"  . LUMBAR LAMINECTOMY/DECOMPRESSION MICRODISCECTOMY Left 09/23/2017   Procedure: Microdiscectomy - left - Lumbar four-Lumbar five;  Surgeon: Earnie Larsson, MD;  Location: [place];  Service: Neurosurgery;  Laterality: Left;  . LUMBAR MICRODISCECTOMY  08/21/2015  . LUMBAR MICRODISCECTOMY Left 09/23/2017   L4-5  . LUMBAR WOUND  DEBRIDEMENT N/A 09/04/2015   Procedure: LUMBAR WOUND DEBRIDEMENT;  Surgeon: Consuella Lose, MD;  Location: [place] [facility];  Service: Neurosurgery;  Laterality: N/A;  . right side sugery     gland removed  . Small bowel capsule  04/11/2009    normal throughout  . VASECTOMY          Home Medications    Prior to Admission medications   Medication Sig Start Date End Date Taking? Authorizing Provider  albuterol (PROVENTIL HFA;VENTOLIN HFA) 108 (90 Base) MCG/ACT inhaler Inhale 1-2 puffs into the lungs every 6 (six) hours as needed for wheezing or shortness of breath. 11/26/16   Doristine Devoid, PA-C  amoxicillin-clavulanate (AUGMENTIN) 875-125 MG tablet Take 1 tablet by mouth 2 (two) times daily. 12/09/17   Sharion Balloon, FNP  cetirizine (ZYRTEC) 10 MG tablet TAKE ONE TABLET BY MOUTH ONCE DAILY 12/22/17   Claretta Fraise, MD  cyclobenzaprine (FLEXERIL) 10 MG tablet Take 1 tablet (10 mg total) by mouth 3 (three) times daily as needed for muscle spasms. 07/27/17   Milton Ferguson, MD  docusate sodium (COLACE) 100 MG capsule Take 100 mg by mouth daily as needed for mild constipation. Bedtime     [provider]  doxepin (SINEQUAN) 25 MG capsule Take 25 mg by mouth at bedtime.     [provider]  doxycycline (VIBRAMYCIN) 100 MG capsule Take 1 capsule (100 mg total) by mouth 2 (two) times daily. One po bid x 7 days 10/23/17   Julianne Rice, MD  EPINEPHrine 0.3 mg/0.3 mL IJ SOAJ injection Inject 0.3 mg into the muscle as needed (allergic reactions).     [provider]  gabapentin (NEURONTIN) 300 MG capsule Take 2 capsules (600 mg total) by mouth 3 (three) times daily. 07/29/17   Mesner, Corene Cornea, MD  gabapentin (NEURONTIN) 600 MG tablet Take 600 mg by mouth 3 (three) times daily.    [provider]  hydrOXYzine (ATARAX/VISTARIL) 25 MG tablet Take 1-2 tablets (25-50 mg total) by mouth every 6 (six) hours as needed for itching (may cause drowsiness; do not take Zyrtec  with this). Patient taking differently: Take 25 mg by mouth at bedtime as needed for itching (may cause drowsiness; do not take Zyrtec with this).  06/07/17   Molpus, John, MD  lamoTRIgine (LAMICTAL) 200 MG tablet Take 200 mg by mouth at bedtime.    [provider]  lisdexamfetamine (VYVANSE) 60 MG capsule Take 60 mg by mouth daily.     [provider]  mupirocin cream (BACTROBAN) 2 % Apply 1 application topically 2 (two) times daily. 08/24/17   Noemi Chapel, MD  ondansetron (ZOFRAN) 4 MG tablet Take 1 tablet (4 mg total) by mouth every 8 (eight) hours as needed for nausea or vomiting. 01/23/18   Quintella Reichert, MD  pantoprazole (PROTONIX) 40 MG tablet TAKE 1 TABLET BY MOUTH TWICE DAILY 02/03/18   Claretta Fraise, MD  sucralfate (CARAFATE) 1 g tablet Take 1  tablet (1 g total) by mouth 4 (four) times daily -  with meals and at bedtime. 01/23/18   Quintella Reichert, MD    Family History Family History  Problem Relation Age of Onset  . Leukemia Father 10  . Seizures Mother   . Bipolar disorder Brother   . ADD / ADHD Brother   . Colon cancer Neg Hx     Social History Social History   Tobacco Use  . Smoking status: Never Smoker  . Smokeless tobacco: Current User    Types: Chew  Substance Use Topics  . Alcohol use: No    Alcohol/week: 0.0 oz  . Drug use: No     Allergies   Ibuprofen; Influenza vaccines; Tylenol [acetaminophen]; Bee venom; and Tramadol   Review of Systems Review of Systems  Constitutional: Negative for chills and fever.  HENT: Positive for rhinorrhea and sore throat. Negative for ear pain and sinus pain.   Eyes: Negative for pain and visual disturbance.  Respiratory: Positive for cough. Negative for shortness of breath.   Cardiovascular: Negative for chest pain.  Gastrointestinal: Negative for abdominal pain.  Genitourinary: Negative for dysuria.  Musculoskeletal: Negative for myalgias.  Skin: Negative for rash.  Neurological: Positive for  headaches.     Physical Exam Updated Vital Signs BP 120/83 (BP Location: Right Arm)   Pulse 97   Temp 98.2 F (36.8 C) (Oral)   Resp 18   Ht 6' (1.829 m)   Wt (!) 182.8 kg (403 lb)   SpO2 100%   BMI 54.66 kg/m   Physical Exam  Constitutional: He appears well-developed and well-nourished.  HENT:  Head: Normocephalic and atraumatic.  Right Ear: External ear normal.  Left Ear: External ear normal.  Nose: Nose normal.  Mouth/Throat: Oropharynx is clear and moist. No oropharyngeal exudate.  Eyes: Conjunctivae are normal.  Neck: Normal range of motion. Neck supple. No tracheal deviation present.  Cardiovascular: Normal rate, regular rhythm, normal heart sounds and intact distal pulses.  Pulmonary/Chest: Effort normal and breath sounds normal. No stridor. No respiratory distress. He has no wheezes. He has no rales.  Lymphadenopathy:    He has no cervical adenopathy.  Neurological: He is alert. GCS eye subscore is 4. GCS verbal subscore is 5. GCS motor subscore is 6.  Skin: Skin is warm and dry.  Psychiatric: He has a normal mood and affect.  Nursing note and vitals reviewed.    ED Treatments / Results  Labs (all labs ordered are listed, but only abnormal results are displayed) Labs Reviewed - No data to display  EKG None  Radiology No results found.  Procedures Procedures (including critical care time)  Medications Ordered in ED Medications  azithromycin (ZITHROMAX) tablet 500 mg (has no administration in time range)     Initial Impression / Assessment and Plan / ED Course  I have reviewed the triage vital signs and the nursing notes.  Pertinent labs & imaging results that were available during my care of the patient were reviewed by me and considered in my medical decision making (see chart for details).  Clinical Course as of Apr 30 1153  Tue Apr 28, 2018  2125 Patient's pulse ox 100%.  He otherwise appears nontoxic in no distress.  I offered him a chest  x-ray which she declines.  He is interested in starting on some antibiotics as he is been with symptoms for 2 weeks and he tells me his wife centimeter because she is sick listening to him cough.  We will cover him with Z-Pak for bronchitis.   [MB]    Clinical Course User Index [MB] Hayden Rasmussen, MD    Final Clinical Impressions(s) / ED Diagnoses   Final diagnoses:  Acute bronchitis, unspecified organism    ED Discharge Orders        Ordered    azithromycin (ZITHROMAX) 250 MG tablet  Daily     04/28/18 2126    benzonatate (TESSALON) 100 MG capsule  3 times daily PRN     04/28/18 2126       Hayden Rasmussen, MD 04/30/18 1154

## 2018-05-06 ENCOUNTER — Other Ambulatory Visit: Payer: Self-pay | Admitting: Family Medicine

## 2018-05-06 DIAGNOSIS — K219 Gastro-esophageal reflux disease without esophagitis: Secondary | ICD-10-CM

## 2018-05-20 ENCOUNTER — Encounter: Payer: Self-pay | Admitting: Acute Care

## 2018-05-20 ENCOUNTER — Ambulatory Visit (INDEPENDENT_AMBULATORY_CARE_PROVIDER_SITE_OTHER): Payer: Medicaid Other | Admitting: Acute Care

## 2018-05-20 VITALS — BP 128/86 | HR 105 | Ht 72.0 in | Wt >= 6400 oz

## 2018-05-20 DIAGNOSIS — G4733 Obstructive sleep apnea (adult) (pediatric): Secondary | ICD-10-CM

## 2018-05-20 NOTE — Patient Instructions (Addendum)
It is good to see you today. We will continue your CPAP as you have been doing. [facility] filter for your machine.  Continue on CPAP at bedtime. You appear to be benefiting from the treatment Goal is to wear for at least 6 hours each night for maximal clinical benefit. Continue to work on weight loss, as the link between excess weight  and sleep apnea is well established.  Do not drive if sleepy. Remember to clean mask, tubing, filter, and reservoir once weekly with soapy water.  Follow up with 3 months with Dr. Annamaria Boots  or Judson Roch NP or before as needed.  Please contact office for sooner follow up if symptoms do not improve or worsen or seek emergency care

## 2018-05-20 NOTE — Progress Notes (Signed)
History of Present Illness [name] is a 32 y.o. male with OSA on CPAP. He is followed by Dr. Annamaria Boots.   05/20/2018  Follow up after initiation of CPAP Therapy. Pt. Presents for follow up. He started on his CPAP 2 months ago and is here for follow up. He states he is doing well.He states he started on CPAP 4-5 years ago.He really likes the nasal pillows. He states he is sleeping better. He has better daytime alertness. He has less tossing and turning at night per his wife.He likes to check his progress on his phone.He was not sent a filter with his last shipment of supplies. He denies fever, chest pain, orthopnea or hemoptysis.  Down Load 04/20/2018-05/19/2018 Air Sense 10 AutoSet AutoSet 5-15 cm H2O Usage 83% ( 25 of 30 days) > 4 hours 60% < 4 Hours 23% Average usage days used 5 hours 18 minutes Median pressure 9.1 cm H2O Max Pressure 14.2 cm H2O AHI is 3.0  Test Results: 03/25/2018 Mild obstructive sleep apnea occurred during this study (AHI = 19.1/h). - No significant central sleep apnea occurred during this study (CAI = 0.0/h). - Moderate oxygen desaturation was noted during this study (Min O2 = 73.0%).  CBC Latest Ref Rng & Units 01/23/2018 10/23/2017 09/16/2017  WBC 4.0 - 10.5 K/uL 11.7(H) 8.5 7.2  Hemoglobin 13.0 - 17.0 g/dL 14.9 14.1 14.5  Hematocrit 39.0 - 52.0 % 44.0 43.8 44.2  Platelets 150 - 400 K/uL 294 240 258    BMP Latest Ref Rng & Units 01/23/2018 10/23/2017 09/16/2017  Glucose 65 - 99 mg/dL 90 94 85  BUN 6 - 20 mg/dL 11 9 9   Creatinine 0.61 - 1.24 mg/dL 1.00 0.87 1.06  BUN/Creat Ratio 8 - 19 - - -  Sodium 135 - 145 mmol/L 138 138 138  Potassium 3.5 - 5.1 mmol/L 3.7 4.2 4.4  Chloride 101 - 111 mmol/L 103 103 105  CO2 22 - 32 mmol/L 26 29 26   Calcium 8.9 - 10.3 mg/dL 9.1 9.3 9.4    BNP No results found for: BNP  ProBNP No results found for: PROBNP  PFT No results found for: FEV1PRE, FEV1POST, FVCPRE, FVCPOST, TLC, DLCOUNC, PREFEV1FVCRT,  PSTFEV1FVCRT  No results found.   Past medical hx Past Medical History:  Diagnosis Date  . ADHD (attention deficit hyperactivity disorder)   . Anxiety   . Bipolar disorder ([place])   . Chronic lower back pain   . Depression   . Esophagitis    Distal esophageal erosions consistent with mild erosive  reflux esophagitis 12/2008 by EGD   . Gastric ulcer   . GERD (gastroesophageal reflux disease)   . Hiatal hernia   . History of stomach ulcers   . Hx MRSA infection 8/08   right thigh  . Hypercholesteremia    denies  . Obesity   . Occult GI bleeding 12/2008   Trivial upper GI bleed/uncontrolled GERD by upper endoscopy 12/2008, normal f/u endoscopy 03/2009 with SBCE at that time  . OSA on CPAP   . Pain management   . Pneumonia 09/01/2015   "on ATB still" (09/04/2015)  . S/P colonoscopy 11/10, 10/08   Dr [name]  . Schizophrenia [facility])   . Thyroid function test abnormal    Noted in 2011 discharge  . Tobacco dipper      Social History   Tobacco Use  . Smoking status: Never Smoker  . Smokeless tobacco: Current User    Types: Chew  Substance Use Topics  .  Alcohol use: No    Alcohol/week: 0.0 oz  . Drug use: No    Mr.Staup reports that he has never smoked. His smokeless tobacco use includes chew. He reports that he does not drink alcohol or use drugs.  Tobacco Cessation: Never smoker Uses smokeless tobacco Encouraged to quit  Past surgical hx, Family hx, Social hx all reviewed.  Current Outpatient Medications on File Prior to Visit  Medication Sig  . albuterol (PROVENTIL HFA;VENTOLIN HFA) 108 (90 Base) MCG/ACT inhaler Inhale 1-2 puffs into the lungs every 6 (six) hours as needed for wheezing or shortness of breath.  . cetirizine (ZYRTEC) 10 MG tablet TAKE ONE TABLET BY MOUTH ONCE DAILY  . cyclobenzaprine (FLEXERIL) 10 MG tablet Take 1 tablet (10 mg total) by mouth 3 (three) times daily as needed for muscle spasms.  [name] Kitchen docusate sodium (COLACE) 100 MG capsule Take 100  mg by mouth daily as needed for mild constipation. Bedtime   . doxepin (SINEQUAN) 25 MG capsule Take 25 mg by mouth at bedtime.   [name] Kitchen EPINEPHrine 0.3 mg/0.3 mL IJ SOAJ injection Inject 0.3 mg into the muscle as needed (allergic reactions).   . gabapentin (NEURONTIN) 300 MG capsule Take 2 capsules (600 mg total) by mouth 3 (three) times daily.  [name] Kitchen gabapentin (NEURONTIN) 600 MG tablet Take 600 mg by mouth 3 (three) times daily.  . hydrOXYzine (ATARAX/VISTARIL) 25 MG tablet Take 1-2 tablets (25-50 mg total) by mouth every 6 (six) hours as needed for itching (may cause drowsiness; do not take Zyrtec with this). (Patient taking differently: Take 25 mg by mouth at bedtime as needed for itching (may cause drowsiness; do not take Zyrtec with this). )  . lamoTRIgine (LAMICTAL) 200 MG tablet Take 200 mg by mouth at bedtime.  [name] Kitchen lisdexamfetamine (VYVANSE) 60 MG capsule Take 60 mg by mouth daily.   . mupirocin cream (BACTROBAN) 2 % Apply 1 application topically 2 (two) times daily.  . ondansetron (ZOFRAN) 4 MG tablet Take 1 tablet (4 mg total) by mouth every 8 (eight) hours as needed for nausea or vomiting.  . pantoprazole (PROTONIX) 40 MG tablet TAKE 1 TABLET BY MOUTH TWICE DAILY  . sucralfate (CARAFATE) 1 g tablet Take 1 tablet (1 g total) by mouth 4 (four) times daily -  with meals and at bedtime.   No current facility-administered medications on file prior to visit.      Allergies  Allergen Reactions  . Ibuprofen Other (See Comments)    Makes ulcers bleed  . Influenza Vaccines Shortness Of Breath and Other (See Comments)    Rash and unable to breathe well  . Tylenol [Acetaminophen] Other (See Comments)    Bleeding ulcers  . Bee Venom Swelling    SWELLING REACTION UNSPECIFIED   . Tramadol Itching    Review Of Systems:  Constitutional:   No  weight loss, night sweats,  Fevers, chills, fatigue, or  lassitude.  HEENT:   No headaches,  Difficulty swallowing,  Tooth/dental problems, or  Sore throat,                  No sneezing, itching, ear ache, nasal congestion, post nasal drip,   CV:  No chest pain,  Orthopnea, PND, swelling in lower extremities, anasarca, dizziness, palpitations, syncope.   GI  No heartburn, indigestion, abdominal pain, nausea, vomiting, diarrhea, change in bowel habits, loss of appetite, bloody stools.   Resp: No shortness of breath with exertion or at rest.  No excess mucus, no productive  cough,  No non-productive cough,  No coughing up of blood.  No change in color of mucus.  No wheezing.  No chest wall deformity  Skin: no rash or lesions.  GU: no dysuria, change in color of urine, no urgency or frequency.  No flank pain, no hematuria   MS:  No joint pain or swelling.  No decreased range of motion.  No back pain.  Psych:  No change in mood or affect. No depression or anxiety.  No memory loss.   Vital Signs BP 128/86 (BP Location: Left Arm, Patient Position: Sitting, Cuff Size: Large)   Pulse (!) 105   Ht 6' (1.829 m)   Wt (!) 411 lb 12.8 oz (186.8 kg)   SpO2 97%   BMI 55.85 kg/m    Physical Exam:  General- No distress,  A&Ox3, pleasant ENT: No sinus tenderness, TM clear, pale nasal mucosa, no oral exudate,no post nasal drip, no LAN Cardiac: S1, S2, regular rate and rhythm, no murmur Chest: No wheeze/ rales/ dullness; no accessory muscle use, no nasal flaring, no sternal retractions Abd.: Soft Non-tender, ND, Obese Ext: No clubbing cyanosis, edema Neuro:  normal strength, A&O x 3, MAE x 4 Skin: No rashes, warm and dry Psych: normal mood and behavior   Assessment/Plan  Obstructive sleep apnea Doing well on current settings Compliance is 83% Plan: We will continue your CPAP as you have been doing. [facility] filter for your machine.  Continue on CPAP at bedtime. You appear to be benefiting from the treatment Goal is to wear for at least 6 hours each night for maximal clinical benefit. Continue to work on weight  loss, as the link between excess weight  and sleep apnea is well established.  Do not drive if sleepy. Remember to clean mask, tubing, filter, and reservoir once weekly with soapy water.  Follow up with 3 months with Dr. Annamaria Boots  or Judson Roch NP or before as needed.  Please contact office for sooner follow up if symptoms do not improve or worsen or seek emergency care   We discussed increasing pressure to 18 cm H2O, but he would prefer to wait until follow up visit to see if his pressure max comes down.     Magdalen Spatz, NP 05/20/2018  5:03 PM

## 2018-05-20 NOTE — Assessment & Plan Note (Signed)
Doing well on current settings Compliance is 83% Plan: We will continue your CPAP as you have been doing. [facility] a filter for your machine.  Continue on CPAP at bedtime. You appear to be benefiting from the treatment Goal is to wear for at least 6 hours each night for maximal clinical benefit. Continue to work on weight loss, as the link between excess weight  and sleep apnea is well established.  Do not drive if sleepy. Remember to clean mask, tubing, filter, and reservoir once weekly with soapy water.  Follow up with 3 months with Dr. Annamaria Boots  or Judson Roch NP or before as needed.  Please contact office for sooner follow up if symptoms do not improve or worsen or seek emergency care   We discussed increasing pressure to 18 cm H2O, but he would prefer to wait until follow up visit to see if his pressure max comes down.

## 2018-06-18 ENCOUNTER — Encounter (HOSPITAL_COMMUNITY): Payer: Self-pay

## 2018-06-18 ENCOUNTER — Emergency Department (HOSPITAL_COMMUNITY)
Admission: EM | Admit: 2018-06-18 | Discharge: 2018-06-18 | Disposition: A | Discharge status: Home / Self Care | Payer: Self-pay | Attending: Emergency Medicine | Admitting: Emergency Medicine

## 2018-06-18 ENCOUNTER — Emergency Department (HOSPITAL_COMMUNITY): Payer: Self-pay

## 2018-06-18 ENCOUNTER — Other Ambulatory Visit: Payer: Self-pay

## 2018-06-18 DIAGNOSIS — R112 Nausea with vomiting, unspecified: Secondary | ICD-10-CM | POA: Insufficient documentation

## 2018-06-18 DIAGNOSIS — Z79899 Other long term (current) drug therapy: Secondary | ICD-10-CM | POA: Insufficient documentation

## 2018-06-18 DIAGNOSIS — R197 Diarrhea, unspecified: Secondary | ICD-10-CM | POA: Insufficient documentation

## 2018-06-18 LAB — COMPREHENSIVE METABOLIC PANEL
ALBUMIN: 3.9 g/dL (ref 3.5–5.0)
ALK PHOS: 58 U/L (ref 38–126)
ALT: 36 U/L (ref 0–44)
AST: 30 U/L (ref 15–41)
Anion gap: 7 (ref 5–15)
BILIRUBIN TOTAL: 0.5 mg/dL (ref 0.3–1.2)
BUN: 12 mg/dL (ref 6–20)
CO2: 28 mmol/L (ref 22–32)
CREATININE: 0.95 mg/dL (ref 0.61–1.24)
Calcium: 9 mg/dL (ref 8.9–10.3)
Chloride: 104 mmol/L (ref 98–111)
GFR calc Af Amer: >60 mL/min (ref >60)
GFR calc non Af Amer: >60 mL/min (ref >60)
GLUCOSE: 104 mg/dL — AB (ref 70–99)
POTASSIUM: 3.9 mmol/L (ref 3.5–5.1)
Sodium: 139 mmol/L (ref 135–145)
TOTAL PROTEIN: 6.9 g/dL (ref 6.5–8.1)

## 2018-06-18 LAB — URINALYSIS, ROUTINE W REFLEX MICROSCOPIC
Bilirubin Urine: NEGATIVE
Glucose, UA: NEGATIVE mg/dL
Hgb urine dipstick: NEGATIVE
KETONES UR: NEGATIVE mg/dL
LEUKOCYTES UA: NEGATIVE
NITRITE: NEGATIVE
PH: 7 (ref 5.0–8.0)
Protein, ur: NEGATIVE mg/dL
SPECIFIC GRAVITY, URINE: 1.012 (ref 1.005–1.030)

## 2018-06-18 LAB — CBC
HEMATOCRIT: 44.4 % (ref 39.0–52.0)
HEMOGLOBIN: 14.4 g/dL (ref 13.0–17.0)
MCH: 27.1 pg (ref 26.0–34.0)
MCHC: 32.4 g/dL (ref 30.0–36.0)
MCV: 83.5 fL (ref 78.0–100.0)
Platelets: 260 10*3/uL (ref 150–400)
RBC: 5.32 MIL/uL (ref 4.22–5.81)
RDW: 14.7 % (ref 11.5–15.5)
WBC: 8.8 10*3/uL (ref 4.0–10.5)

## 2018-06-18 LAB — LIPASE, BLOOD: Lipase: 36 U/L (ref 11–51)

## 2018-06-18 MED ORDER — ONDANSETRON HCL 4 MG/2ML IJ SOLN
4.0000 mg | Freq: Once | INTRAMUSCULAR | Status: AC
  Administered 2018-06-18: 4 mg via INTRAVENOUS
  Filled 2018-06-18: qty 2

## 2018-06-18 MED ORDER — LOPERAMIDE HCL 2 MG PO CAPS: 2.0000 mg | ORAL_CAPSULE | Freq: Four times a day (QID) | ORAL | 0 refills | Status: DC | PRN

## 2018-06-18 MED ORDER — SODIUM CHLORIDE 0.9 % IV BOLUS
1000.0000 mL | Freq: Once | INTRAVENOUS | Status: AC
  Administered 2018-06-18: 1000 mL via INTRAVENOUS

## 2018-06-18 MED ORDER — FENTANYL CITRATE (PF) 100 MCG/2ML IJ SOLN
100.0000 ug | Freq: Once | INTRAMUSCULAR | Status: AC
  Administered 2018-06-18: 100 ug via INTRAVENOUS
  Filled 2018-06-18: qty 2

## 2018-06-18 MED ORDER — ONDANSETRON 4 MG PO TBDP: 4.0000 mg | ORAL_TABLET | Freq: Three times a day (TID) | ORAL | 0 refills | Status: DC | PRN

## 2018-06-18 NOTE — Discharge Instructions (Signed)
Your testing is normal Your urine is normal Your xray shows diarrhea but no other pathological findings Take Zofran as needed for nausea - imodium for diarrhea Drink plenty of fluids. ER for worsening symptoms including increased pain, fever, vomiting or bloody stools. Stay out of work for the next 2 days while your body rests / heals.

## 2018-06-18 NOTE — ED Triage Notes (Signed)
Pt has been having nvd for 3 days. States now he is burping all the time and it has a foul smell. States left shoulder pain and head is hurts. Has thrown up twice and had diarrhea 7 times in the last 24 hours. Took immodium yesterday without relief.

## 2018-06-18 NOTE — ED Provider Notes (Signed)
[facility] EMERGENCY DEPARTMENT Provider Note   CSN: [id number] Arrival date & time: 06/18/18  1205     History   Chief Complaint Chief Complaint  Patient presents with  . Diarrhea    HPI [name] is a 32 y.o. male.  HPI  The patient is an obese 32 year old male, history of bipolar disorder ADHD, acid reflux and history of stomach ulcers, currently taking Protonix twice a day, Vyvanse, states that he has had 3 days of nausea vomiting and diarrhea, multiple episodes of watery diarrhea per day, he was able to eat biscuits and gravy today but complains that his stools are still loose, they are becoming more formed, he is not having fevers but has diffuse abdominal discomfort.  No blood in the stools, no difficulty urinating, vomiting is getting better but still present.  He states that his burping is now foul-smelling.  He denies any prior abdominal surgery, denies travel, sick contacts or recent antibiotics and has not been hospitalized recently.  Past Medical History:  Diagnosis Date  . ADHD (attention deficit hyperactivity disorder)   . Anxiety   . Bipolar disorder ([place])   . Chronic lower back pain   . Depression   . Esophagitis    Distal esophageal erosions consistent with mild erosive  reflux esophagitis 12/2008 by EGD   . Gastric ulcer   . GERD (gastroesophageal reflux disease)   . Hiatal hernia   . History of stomach ulcers   . Hx MRSA infection 8/08   right thigh  . Hypercholesteremia    denies  . Obesity   . Occult GI bleeding 12/2008   Trivial upper GI bleed/uncontrolled GERD by upper endoscopy 12/2008, normal f/u endoscopy 03/2009 with SBCE at that time  . OSA on CPAP   . Pain management   . Pneumonia 09/01/2015   "on ATB still" (09/04/2015)  . S/P colonoscopy 11/10, 10/08   Dr [name]  . Schizophrenia [facility])   . Thyroid function test abnormal    Noted in 2011 discharge  . Tobacco dipper     Patient Active Problem List   Diagnosis Date Noted    . 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 Wound drainage 09/04/2015  . Seasonal and perennial allergic rhinitis 07/23/2014  . Abdominal mass of other site 06/15/2012  . Abdominal cramps 06/15/2012  . Obstructive sleep apnea 05/03/2012  . Syncope and collapse 05/03/2012  . Chest pain 05/03/2012  . Obesity, morbid ([place]) 05/03/2012  . Anal fissure 05/27/2011  . UNSPECIFIED ANEMIA 09/11/2009  . Blood in stool 04/06/2009  . ABDOMINAL PAIN OTHER SPECIFIED SITE 03/07/2009  . BIPOLAR DISORDER UNSPECIFIED 03/06/2009  . SMOKELESS TOBACCO ABUSE 03/06/2009  . ATTENTION DEFICIT HYPERACTIVITY DISORDER 03/06/2009  . GERD 03/06/2009  . NAUSEA AND VOMITING 03/06/2009  . DIARRHEA 03/06/2009    Past Surgical History:  Procedure Laterality Date  . ANKLE FRACTURE SURGERY Left ~ 2008  . BACK SURGERY    . COLONOSCOPY  10/10/2009   anal papilla otherwise normal  . ESOPHAGOGASTRODUODENOSCOPY   04/07/2009   Normal esophagus, small hiatal hernia  . ESOPHAGOGASTRODUODENOSCOPY  01/09/2009   Distal esophageal erosions consistent with mild erosive reflux esophagitis, otherwise normal esophagus, small hiatal herniaotherwise normal stomach, D1-D2   . ESOPHAGOGASTRODUODENOSCOPY  08/26/2007   Normal esophagus, a small hiatal/hernia, otherwise normal stomach D1 through D3  . FRACTURE SURGERY    . ileocolonoscopy  08/26/2007    A normal rectum, colon, and terminal ileum  . INCISION AND DRAINAGE  09/04/2015   "  reopened my back incsion"  . LUMBAR LAMINECTOMY/DECOMPRESSION MICRODISCECTOMY Left 09/23/2017   Procedure: Microdiscectomy - left - Lumbar four-Lumbar five;  Surgeon: Earnie Larsson, MD;  Location: [place];  Service: Neurosurgery;  Laterality: Left;  . LUMBAR MICRODISCECTOMY  08/21/2015  . LUMBAR MICRODISCECTOMY Left 09/23/2017   L4-5  . LUMBAR WOUND DEBRIDEMENT N/A 09/04/2015   Procedure: LUMBAR WOUND DEBRIDEMENT;  Surgeon: Consuella Lose, MD;  Location: [place] [facility];  Service: Neurosurgery;  Laterality:  N/A;  . right side sugery     gland removed  . Small bowel capsule  04/11/2009    normal throughout  . VASECTOMY          Home Medications    Prior to Admission medications   Medication Sig Start Date End Date Taking? Authorizing Provider  albuterol (PROVENTIL HFA;VENTOLIN HFA) 108 (90 Base) MCG/ACT inhaler Inhale 1-2 puffs into the lungs every 6 (six) hours as needed for wheezing or shortness of breath. 11/26/16   Doristine Devoid, PA-C  cetirizine (ZYRTEC) 10 MG tablet TAKE ONE TABLET BY MOUTH ONCE DAILY 12/22/17   Claretta Fraise, MD  cyclobenzaprine (FLEXERIL) 10 MG tablet Take 1 tablet (10 mg total) by mouth 3 (three) times daily as needed for muscle spasms. 07/27/17   Milton Ferguson, MD  docusate sodium (COLACE) 100 MG capsule Take 100 mg by mouth daily as needed for mild constipation. Bedtime     [provider]  doxepin (SINEQUAN) 25 MG capsule Take 25 mg by mouth at bedtime.     [provider]  EPINEPHrine 0.3 mg/0.3 mL IJ SOAJ injection Inject 0.3 mg into the muscle as needed (allergic reactions).     [provider]  gabapentin (NEURONTIN) 300 MG capsule Take 2 capsules (600 mg total) by mouth 3 (three) times daily. 07/29/17   Mesner, Corene Cornea, MD  gabapentin (NEURONTIN) 600 MG tablet Take 600 mg by mouth 3 (three) times daily.    [provider]  hydrOXYzine (ATARAX/VISTARIL) 25 MG tablet Take 1-2 tablets (25-50 mg total) by mouth every 6 (six) hours as needed for itching (may cause drowsiness; do not take Zyrtec with this). Patient taking differently: Take 25 mg by mouth at bedtime as needed for itching (may cause drowsiness; do not take Zyrtec with this).  06/07/17   Molpus, John, MD  lamoTRIgine (LAMICTAL) 200 MG tablet Take 200 mg by mouth at bedtime.    [provider]  lisdexamfetamine (VYVANSE) 60 MG capsule Take 60 mg by mouth daily.     [provider]  loperamide (IMODIUM) 2 MG capsule Take 1 capsule (2 mg total) by mouth  4 (four) times daily as needed for diarrhea or loose stools. 06/18/18   Noemi Chapel, MD  mupirocin cream (BACTROBAN) 2 % Apply 1 application topically 2 (two) times daily. 08/24/17   Noemi Chapel, MD  ondansetron (ZOFRAN ODT) 4 MG disintegrating tablet Take 1 tablet (4 mg total) by mouth every 8 (eight) hours as needed for nausea. 06/18/18   Noemi Chapel, MD  ondansetron (ZOFRAN) 4 MG tablet Take 1 tablet (4 mg total) by mouth every 8 (eight) hours as needed for nausea or vomiting. 01/23/18   Quintella Reichert, MD  pantoprazole (PROTONIX) 40 MG tablet TAKE 1 TABLET BY MOUTH TWICE DAILY 05/07/18   Claretta Fraise, MD  sucralfate (CARAFATE) 1 g tablet Take 1 tablet (1 g total) by mouth 4 (four) times daily -  with meals and at bedtime. 01/23/18   Quintella Reichert, MD    Family  History Family History  Problem Relation Age of Onset  . Leukemia Father 31  . Seizures Mother   . Bipolar disorder Brother   . ADD / ADHD Brother   . Colon cancer Neg Hx     Social History Social History   Tobacco Use  . Smoking status: Never Smoker  . Smokeless tobacco: Current User    Types: Chew  Substance Use Topics  . Alcohol use: No    Alcohol/week: 0.0 oz  . Drug use: No     Allergies   Ibuprofen; Influenza vaccines; Tylenol [acetaminophen]; Bee venom; and Tramadol   Review of Systems Review of Systems  All other systems reviewed and are negative.    Physical Exam Updated Vital Signs BP (!) 159/120 (BP Location: Right Arm)   Pulse 78   Temp (!) 97.4 F (36.3 C) (Oral)   Resp 20   Ht 6' (1.829 m)   Wt (!) 163.3 kg (360 lb)   SpO2 99%   BMI 48.82 kg/m   Physical Exam  Constitutional: He appears well-developed and well-nourished. No distress.  HENT:  Head: Normocephalic and atraumatic.  Mouth/Throat: Oropharynx is clear and moist. No oropharyngeal exudate.  Eyes: Pupils are equal, round, and reactive to light. Conjunctivae and EOM are normal. Right eye exhibits no discharge. Left eye  exhibits no discharge. No scleral icterus.  Neck: Normal range of motion. Neck supple. No JVD present. No thyromegaly present.  Cardiovascular: Normal rate, regular rhythm, normal heart sounds and intact distal pulses. Exam reveals no gallop and no friction rub.  No murmur heard. Pulmonary/Chest: Effort normal and breath sounds normal. No respiratory distress. He has no wheezes. He has no rales.  Abdominal: Soft. Bowel sounds are normal. He exhibits no distension and no mass. There is no tenderness ( diffuse abd ttp. - no guarding).  Musculoskeletal: Normal range of motion. He exhibits no edema or tenderness.  Lymphadenopathy:    He has no cervical adenopathy.  Neurological: He is alert. Coordination normal.  Skin: Skin is warm and dry. No rash noted. No erythema.  Psychiatric: He has a normal mood and affect. His behavior is normal.  Nursing note and vitals reviewed.    ED Treatments / Results  Labs (all labs ordered are listed, but only abnormal results are displayed) Labs Reviewed  COMPREHENSIVE METABOLIC PANEL - Abnormal; Notable for the following components:      Result Value   Glucose, Bld 104 (*)    All other components within normal limits  URINALYSIS, ROUTINE W REFLEX MICROSCOPIC - Abnormal; Notable for the following components:   Color, Urine STRAW (*)    All other components within normal limits  LIPASE, BLOOD  CBC    EKG None  Radiology Dg Abd Acute W/chest  Result Date: 06/18/2018 CLINICAL DATA:  Nausea, vomiting, and diarrhea for the past 3 days with intermittent chest and left shoulder pain, also headache. History of esophagitis and gastric ulcers and previous episodes of pneumonia. EXAM: DG ABDOMEN ACUTE W/ 1V CHEST COMPARISON:  Chest x-ray of May 02, 2018 FINDINGS: The lungs are well-expanded. There is no focal infiltrate. There is no pleural effusion. The heart and pulmonary vascularity are normal. The mediastinum is normal in width. The trachea is midline.  Within the abdomen the colonic stool burden is moderate. There is no small or large bowel obstructive pattern. There is mild gaseous distention of the stomach. There are no abnormal soft tissue calcifications. IMPRESSION: There is no pneumonia nor other acute cardiopulmonary  abnormality. Moderately increased colonic stool burden could reflect constipation in the appropriate clinical setting. No evidence of bowel obstruction or perforation. Electronically Signed   By: David  Martinique M.D.   On: 06/18/2018 15:57    Procedures Procedures (including critical care time)  Medications Ordered in ED Medications  fentaNYL (SUBLIMAZE) injection 100 mcg (100 mcg Intravenous Given 06/18/18 1332)  ondansetron (ZOFRAN) injection 4 mg (4 mg Intravenous Given 06/18/18 1332)  sodium chloride 0.9 % bolus 1,000 mL (0 mLs Intravenous Stopped 06/18/18 1434)     Initial Impression / Assessment and Plan / ED Course  I have reviewed the triage vital signs and the nursing notes.  Pertinent labs & imaging results that were available during my care of the patient were reviewed by me and considered in my medical decision making (see chart for details).  Clinical Course as of Jun 20 655  Thu Jun 18, 2018  1546 Comprehensive metabolic panel(!) [BM]  2751 Lipase, blood [BM]  1546 CBC [BM]  1546 CBC normal, metabolic panel with renal and liver function is also normal, electrolytes are normal, lipase is normal and the urinalysis shows no signs of infection or dehydration with no ketones, normal specific gravity.  Vital signs show mild hypertension but no tachycardia and no fever.  He has been given IV fluids, antiemetics, he is feeling better at this time appears stable for discharge.  I have personally seen and interpreted his acute abdominal series, there is no signs of free air under the diaphragm, no infiltrates in the chest, no air-fluid levels to suggest a small bowel obstruction.  Urinalysis, Routine w reflex  microscopic(!) [BM]    Clinical Course User Index [BM] Noemi Chapel, MD    Morbidly obese, mild diffuse abdominal tenderness, could be consistent with colitis, doubt acute abdominal obstruction secondary to the patient's ability to have diarrhea and the ability to eat both biscuits and gravy this morning.  Will check an acute abdominal series, labs, hydrate, nausea medicines and fentanyl.  Patient agreeable.  Final Clinical Impressions(s) / ED Diagnoses   Final diagnoses:  Diarrhea, unspecified type  Non-intractable vomiting with nausea, unspecified vomiting type    ED Discharge Orders        Ordered    ondansetron (ZOFRAN ODT) 4 MG disintegrating tablet  Every 8 hours PRN     06/18/18 1547    loperamide (IMODIUM) 2 MG capsule  4 times daily PRN     06/18/18 1547       Noemi Chapel, MD 06/19/18 [phone]

## 2018-07-03 DIAGNOSIS — E039 Hypothyroidism, unspecified: Secondary | ICD-10-CM | POA: Diagnosis not present

## 2018-07-03 DIAGNOSIS — R0789 Other chest pain: Secondary | ICD-10-CM | POA: Diagnosis not present

## 2018-07-03 DIAGNOSIS — Z79899 Other long term (current) drug therapy: Secondary | ICD-10-CM | POA: Diagnosis not present

## 2018-07-03 DIAGNOSIS — R000 Tachycardia, unspecified: Secondary | ICD-10-CM | POA: Diagnosis not present

## 2018-07-03 DIAGNOSIS — M7582 Other shoulder lesions, left shoulder: Secondary | ICD-10-CM | POA: Diagnosis not present

## 2018-07-03 DIAGNOSIS — F319 Bipolar disorder, unspecified: Secondary | ICD-10-CM | POA: Diagnosis not present

## 2018-07-03 DIAGNOSIS — K219 Gastro-esophageal reflux disease without esophagitis: Secondary | ICD-10-CM | POA: Diagnosis not present

## 2018-07-03 DIAGNOSIS — R51 Headache: Secondary | ICD-10-CM | POA: Diagnosis not present

## 2018-07-03 DIAGNOSIS — R079 Chest pain, unspecified: Secondary | ICD-10-CM | POA: Diagnosis not present

## 2018-07-03 DIAGNOSIS — M779 Enthesopathy, unspecified: Secondary | ICD-10-CM | POA: Diagnosis not present

## 2018-07-07 ENCOUNTER — Encounter: Payer: Self-pay | Admitting: Family Medicine

## 2018-07-07 ENCOUNTER — Ambulatory Visit: Payer: Medicaid Other | Admitting: Family Medicine

## 2018-07-07 VITALS — BP 125/82 | HR 109 | Temp 98.7°F | Ht 72.0 in | Wt >= 6400 oz

## 2018-07-07 DIAGNOSIS — R7989 Other specified abnormal findings of blood chemistry: Secondary | ICD-10-CM

## 2018-07-07 LAB — BAYER DCA HB A1C WAIVED: HB A1C (BAYER DCA - WAIVED): 5.1 % (ref <7.0)

## 2018-07-07 NOTE — Patient Instructions (Addendum)
I will contact you in the next 2 days with the results of your labs.  We will plan to start Synthroid pending these results.  Plan to follow-up in the next 6 weeks or sooner if needed.  Hypothyroidism Hypothyroidism is a disorder of the thyroid. The thyroid is a large gland that is located in the lower front of the neck. The thyroid releases hormones that control how the body works. With hypothyroidism, the thyroid does not make enough of these hormones. What are the causes? Causes of hypothyroidism may include:  Viral infections.  Pregnancy.  Your own defense system (immune system) attacking your thyroid.  Certain medicines.  Birth defects.  Past radiation treatments to your head or neck.  Past treatment with radioactive iodine.  Past surgical removal of part or all of your thyroid.  Problems with the gland that is located in the center of your brain (pituitary).  What are the signs or symptoms? Signs and symptoms of hypothyroidism may include:  Feeling as though you have no energy (lethargy).  Inability to tolerate cold.  Weight gain that is not explained by a change in diet or exercise habits.  Dry skin.  Coarse hair.  Menstrual irregularity.  Slowing of thought processes.  Constipation.  Sadness or depression.  How is this diagnosed? Your health care provider may diagnose hypothyroidism with blood tests and ultrasound tests. How is this treated? Hypothyroidism is treated with medicine that replaces the hormones that your body does not make. After you begin treatment, it may take several weeks for symptoms to go away. Follow these instructions at home:  Take medicines only as directed by your health care provider.  If you start taking any new medicines, tell your health care provider.  Keep all follow-up visits as directed by your health care provider. This is important. As your condition improves, your dosage needs may change. You will need to have blood  tests regularly so that your health care provider can watch your condition. Contact a health care provider if:  Your symptoms do not get better with treatment.  You are taking thyroid replacement medicine and: ? You sweat excessively. ? You have tremors. ? You feel anxious. ? You lose weight rapidly. ? You cannot tolerate heat. ? You have emotional swings. ? You have diarrhea. ? You feel weak. Get help right away if:  You develop chest pain.  You develop an irregular heartbeat.  You develop a rapid heartbeat. This information is not intended to replace advice given to you by your health care provider. Make sure you discuss any questions you have with your health care provider. Document Released: 11/11/2005 Document Revised: 04/18/2016 Document Reviewed: 03/29/2014 Elsevier Interactive Patient Education  2018 [employer].

## 2018-07-07 NOTE — Progress Notes (Signed)
Subjective: CC: Emergency department follow-up PCP: [name], DO LZJ:QBHALPFX [name] is a 32 y.o. male presenting to clinic today for:  1.  Emergency department follow-up Patient here for follow-up after being evaluated in the emergency department for left-sided shoulder pain, headache.  He notes that he had labs performed which demonstrated an abnormality in his TSH.  He does report a past medical history of thyroid disease and used to be on replacement for this up until about 10 years ago.  He notes that he had his labs checked and was told he no longer needed to be on the medications.  He does report poor energy, difficulty losing weight, cold alternating with heat intolerance.  He has alternating constipation and diarrhea.  He has a strong family history of thyroid disorders on his father's side.  No known other autoimmune disorders.  He denies any history of head or neck surgeries, radiation.  Not currently on any medications which would alter thyroid levels that he knows of.   ROS: Per HPI  Allergies  Allergen Reactions  . Ibuprofen Other (See Comments)    Makes ulcers bleed  . Influenza Vaccines Shortness Of Breath and Other (See Comments)    Rash and unable to breathe well  . Tylenol [Acetaminophen] Other (See Comments)    Bleeding ulcers  . Bee Venom Swelling    SWELLING REACTION UNSPECIFIED   . Tramadol Itching   Past Medical History:  Diagnosis Date  . ADHD (attention deficit hyperactivity disorder)   . Anxiety   . Bipolar disorder ([place])   . Chronic lower back pain   . Depression   . Esophagitis    Distal esophageal erosions consistent with mild erosive  reflux esophagitis 12/2008 by EGD   . Gastric ulcer   . GERD (gastroesophageal reflux disease)   . Hiatal hernia   . History of stomach ulcers   . Hx MRSA infection 8/08   right thigh  . Hypercholesteremia    denies  . Obesity   . Occult GI bleeding 12/2008   Trivial upper GI bleed/uncontrolled  GERD by upper endoscopy 12/2008, normal f/u endoscopy 03/2009 with SBCE at that time  . OSA on CPAP   . Pain management   . Pneumonia 09/01/2015   "on ATB still" (09/04/2015)  . S/P colonoscopy 11/10, 10/08   Dr [name]  . Schizophrenia [facility])   . Thyroid function test abnormal    Noted in 2011 discharge  . Tobacco dipper     Current Outpatient Medications:  .  albuterol (PROVENTIL HFA;VENTOLIN HFA) 108 (90 Base) MCG/ACT inhaler, Inhale 1-2 puffs into the lungs every 6 (six) hours as needed for wheezing or shortness of breath., Disp: 1 Inhaler, Rfl: 0 .  cetirizine (ZYRTEC) 10 MG tablet, TAKE ONE TABLET BY MOUTH ONCE DAILY, Disp: 30 tablet, Rfl: 5 .  cyclobenzaprine (FLEXERIL) 10 MG tablet, Take 1 tablet (10 mg total) by mouth 3 (three) times daily as needed for muscle spasms., Disp: 20 tablet, Rfl: 0 .  doxepin (SINEQUAN) 25 MG capsule, Take 25 mg by mouth at bedtime. , Disp: , Rfl:  .  EPINEPHrine 0.3 mg/0.3 mL IJ SOAJ injection, Inject 0.3 mg into the muscle as needed (allergic reactions). , Disp: , Rfl:  .  lamoTRIgine (LAMICTAL) 200 MG tablet, Take 200 mg by mouth at bedtime., Disp: , Rfl:  .  lisdexamfetamine (VYVANSE) 60 MG capsule, Take 60 mg by mouth daily. , Disp: , Rfl:  .  pantoprazole (PROTONIX) 40  MG tablet, TAKE 1 TABLET BY MOUTH TWICE DAILY, Disp: 180 tablet, Rfl: 0 .  docusate sodium (COLACE) 100 MG capsule, Take 100 mg by mouth daily as needed for mild constipation. Bedtime , Disp: , Rfl:  Social History   Socioeconomic History  . Marital status: Married    Spouse name: Not on file  . Number of children: 2  . Years of education: Not on file  . Highest education level: Not on file  Occupational History  . Occupation: Chief Financial Officer: [employer]  . Occupation: Hydrologist  Social Needs  . Financial resource strain: Not on file  . Food insecurity:    Worry: Not on file    Inability: Not on file  . Transportation needs:    Medical: Not on  file    Non-medical: Not on file  Tobacco Use  . Smoking status: Never Smoker  . Smokeless tobacco: Current User    Types: Chew  Substance and Sexual Activity  . Alcohol use: No    Alcohol/week: 0.0 standard drinks  . Drug use: No  . Sexual activity: Not Currently  Lifestyle  . Physical activity:    Days per week: Not on file    Minutes per session: Not on file  . Stress: Not on file  Relationships  . Social connections:    Talks on phone: Not on file    Gets together: Not on file    Attends religious service: Not on file    Active member of club or organization: Not on file    Attends meetings of clubs or organizations: Not on file    Relationship status: Not on file  . Intimate partner violence:    Fear of current or ex partner: Not on file    Emotionally abused: Not on file    Physically abused: Not on file    Forced sexual activity: Not on file  Other Topics Concern  . Not on file  Social History Narrative  . Not on file   Family History  Problem Relation Age of Onset  . Leukemia Father 38  . Seizures Mother   . Bipolar disorder Brother   . ADD / ADHD Brother   . Colon cancer Neg Hx     Objective: Office vital signs reviewed. BP 125/82   Pulse (!) 109   Temp 98.7 F (37.1 C)   Ht 6' (1.829 m)   Wt (!) 406 lb (184.2 kg)   BMI 55.06 kg/m   Physical Examination:  General: Awake, alert, morbidly obese, No acute distress HEENT: Normal    Neck: No masses palpated. No lymphadenopathy; no thyromegaly or nodules noted.  No goiter.    Eyes: PERRLA, extraocular membranes intact, sclera white.  No exophthalmos.    Throat: moist mucus membranes Cardio: regular rate and rhythm, S1S2 heard, no murmurs appreciated Pulm: clear to auscultation bilaterally, no wheezes, rhonchi or rales; normal work of breathing on room air Extremities: warm, well perfused, No edema, cyanosis or clubbing; +2 pulses bilaterally MSK: Antalgic gait and normal station Skin: dry; intact; no  rashes or lesions; normal temperature Neuro: No resting tremor noted.  Assessment/ Plan: 32 y.o. male   1. Abnormal TSH I reviewed the emergency department notes, labs and imaging studies from [facility].  He was noted to have a TSH that was slightly elevated to 5.89 with reference range of 0.34-5.60.  Last thyroid studies that I could find were from September 2014 which were normal.  We will plan to recheck thyroid panel, add TPO Ab and thyroglobulin antibody.  Weight-based dosing for patient at 1 mcg/kg would be approximately 184 mcg daily.  Given very mild abnormal levels, would favor starting at 100 mcg daily and titrating up pending TSH levels.  We will discuss this further with patient once labs have resulted. - Thyroid Panel With TSH - Thyroid Peroxidase Antibody - Thyroglobulin antibody  2. Obesity, morbid ([place]) A1c did not demonstrate any evidence of diabetes.  Will check lipid panel.  I do question if current mental health medications may be impacting weight.  Patient to follow-up as directed. - Thyroid Panel With TSH - Lipid Panel - Bayer DCA Hb A1c Waived   Orders Placed This Encounter  Procedures  . Thyroid Panel With TSH  . Lipid Panel  . Bayer DCA Hb A1c Waived  . Thyroid Peroxidase Antibody  . Thyroglobulin antibody     [name], DO [place] [phone]

## 2018-07-08 LAB — THYROID PEROXIDASE ANTIBODY: Thyroperoxidase Ab SerPl-aCnc: 17 IU/mL (ref 0–34)

## 2018-07-08 LAB — THYROGLOBULIN ANTIBODY: Thyroglobulin Antibody: <1 IU/mL (ref 0.0–0.9)

## 2018-07-08 LAB — THYROID PANEL WITH TSH
Free Thyroxine Index: 2.1 (ref 1.2–4.9)
T3 Uptake Ratio: 29 % (ref 24–39)
T4 TOTAL: 7.3 ug/dL (ref 4.5–12.0)
TSH: 4.03 u[IU]/mL (ref 0.450–4.500)

## 2018-07-08 LAB — LIPID PANEL
CHOL/HDL RATIO: 3.9 ratio (ref 0.0–5.0)
CHOLESTEROL TOTAL: 155 mg/dL (ref 100–199)
HDL: 40 mg/dL (ref >39)
LDL CALC: 88 mg/dL (ref 0–99)
TRIGLYCERIDES: 136 mg/dL (ref 0–149)
VLDL CHOLESTEROL CAL: 27 mg/dL (ref 5–40)

## 2018-07-09 ENCOUNTER — Ambulatory Visit: Payer: BLUE CROSS/BLUE SHIELD | Admitting: Family

## 2018-07-09 ENCOUNTER — Encounter: Payer: Self-pay | Admitting: Family

## 2018-07-09 VITALS — BP 105/66 | HR 86 | Temp 96.7°F | Ht 72.0 in | Wt >= 6400 oz

## 2018-07-09 DIAGNOSIS — R1032 Left lower quadrant pain: Secondary | ICD-10-CM

## 2018-07-09 DIAGNOSIS — R197 Diarrhea, unspecified: Secondary | ICD-10-CM

## 2018-07-09 DIAGNOSIS — K921 Melena: Secondary | ICD-10-CM

## 2018-07-09 DIAGNOSIS — K5792 Diverticulitis of intestine, part unspecified, without perforation or abscess without bleeding: Secondary | ICD-10-CM

## 2018-07-09 MED ORDER — CIPROFLOXACIN HCL 500 MG PO TABS: 500.0000 mg | ORAL_TABLET | Freq: Two times a day (BID) | ORAL | 0 refills | Status: DC

## 2018-07-09 MED ORDER — METRONIDAZOLE 500 MG PO TABS: 500.0000 mg | ORAL_TABLET | Freq: Three times a day (TID) | ORAL | 0 refills | Status: DC

## 2018-07-09 NOTE — Progress Notes (Signed)
Subjective:    Patient ID: [name], male    DOB: Apr 24, 1986, 32 y.o.   MRN: [id number]  Chief Complaint  Patient presents with  . stomach pain and bloody stools    for two days, 3 to 5 times daily    Abdominal Pain  This is a new problem. The current episode started yesterday. The onset quality is sudden. The problem occurs constantly. The problem has been unchanged. The pain is at a severity of 8/10. The pain is moderate. The abdominal pain radiates to the LLQ and RLQ. Associated symptoms include diarrhea and nausea. Pertinent negatives include no constipation, dysuria, flatus, frequency, headaches, hematuria or vomiting. The pain is aggravated by movement. He has tried nothing for the symptoms. The treatment provided no relief.  Diarrhea   The current episode started yesterday. The problem occurs 5 to 10 times per day. The problem has been unchanged. The stool consistency is described as bloody and watery. Associated symptoms include abdominal pain. Pertinent negatives include no headaches, increased  flatus or vomiting.      Review of Systems  Gastrointestinal: Positive for abdominal pain, diarrhea and nausea. Negative for constipation, flatus and vomiting.  Genitourinary: Negative for dysuria, frequency and hematuria.  Neurological: Negative for headaches.  All other systems reviewed and are negative.      Objective:   Physical Exam  Constitutional: He is oriented to person, place, and time. He appears well-developed and well-nourished. No distress.  Morbid obese  HENT:  Head: Normocephalic.  Eyes: Pupils are equal, round, and reactive to light. Right eye exhibits no discharge. Left eye exhibits no discharge.  Neck: Normal range of motion. Neck supple. No thyromegaly present.  Cardiovascular: Normal rate, regular rhythm, normal heart sounds and intact distal pulses.  No murmur heard. Pulmonary/Chest: Effort normal and breath sounds normal. No respiratory distress.  He has no wheezes.  Abdominal: Soft. Bowel sounds are normal. He exhibits no distension. There is tenderness (Mild LLQ).  Musculoskeletal: Normal range of motion. He exhibits no edema or tenderness.  Neurological: He is alert and oriented to person, place, and time. He has normal reflexes. No cranial nerve deficit.  Skin: Skin is warm and dry. No rash noted. No erythema.  Psychiatric: He has a normal mood and affect. His behavior is normal. Judgment and thought content normal.  Vitals reviewed.     BP 105/66   Pulse 86   Temp (!) 96.7 F (35.9 C) (Oral)   Ht 6' (1.829 m)   Wt (!) 409 lb 12.8 oz (185.9 kg)   BMI 55.58 kg/m      Assessment & Plan:  [name] comes in today with chief complaint of stomach pain and bloody stools (for two days, 3 to 5 times daily)   Diagnosis and orders addressed:  1. Left lower quadrant pain - CT Abdomen Pelvis W Contrast; Future - CBC with Differential/Platelet - CMP14+EGFR - Ambulatory referral to Gastroenterology  2. Obesity, morbid ([place]) - CBC with Differential/Platelet - CMP14+EGFR  3. Diarrhea, unspecified type - CT Abdomen Pelvis W Contrast; Future - CBC with Differential/Platelet - CMP14+EGFR - Ambulatory referral to Gastroenterology  4. Blood in stool - CT Abdomen Pelvis W Contrast; Future - CBC with Differential/Platelet - CMP14+EGFR - Ambulatory referral to Gastroenterology  5. Diverticulitis - ciprofloxacin (CIPRO) 500 MG tablet; Take 1 tablet (500 mg total) by mouth 2 (two) times daily.  Dispense: 14 tablet; Refill: 0 - metroNIDAZOLE (FLAGYL) 500 MG tablet; Take 1 tablet (500  mg total) by mouth 3 (three) times daily.  Dispense: 24 tablet; Refill: 0 - CBC with Differential/Platelet - CMP14+EGFR - Ambulatory referral to Gastroenterology   Labs pending Will start flagyl and cipro today PT states he called his GI doctor and was told he could not be seen until 12/19, we will place referral to get seen earlier  and order CT scan to rule out any other causes Avoid NSAID's Increase fiber  Follow up plan:  Keep follow up with PCP and GI, RTO if symptoms worsen or do not improve   Evelina Dun, FNP

## 2018-07-09 NOTE — Patient Instructions (Signed)
Diverticulitis °Diverticulitis is infection or inflammation of small pouches (diverticula) in the colon that form due to a condition called diverticulosis. Diverticula can trap stool (feces) and bacteria, causing infection and inflammation. °Diverticulitis may cause severe stomach pain and diarrhea. It may lead to tissue damage in the colon that causes bleeding. The diverticula may also burst (rupture) and cause infected stool to enter other areas of the abdomen. °Complications of diverticulitis can include: °· Bleeding. °· Severe infection. °· Severe pain. °· Rupture (perforation) of the colon. °· Blockage (obstruction) of the colon. ° °What are the causes? °This condition is caused by stool becoming trapped in the diverticula, which allows bacteria to grow in the diverticula. This leads to inflammation and infection. °What increases the risk? °You are more likely to develop this condition if: °· You have diverticulosis. The risk for diverticulosis increases if: °? You are overweight or obese. °? You use tobacco products. °? You do not get enough exercise. °· You eat a diet that does not include enough fiber. High-fiber foods include fruits, vegetables, beans, nuts, and whole grains. ° °What are the signs or symptoms? °Symptoms of this condition may include: °· Pain and tenderness in the abdomen. The pain is normally located on the left side of the abdomen, but it may occur in other areas. °· Fever and chills. °· Bloating. °· Cramping. °· Nausea. °· Vomiting. °· Changes in bowel routines. °· Blood in your stool. ° °How is this diagnosed? °This condition is diagnosed based on: °· Your medical history. °· A physical exam. °· Tests to make sure there is nothing else causing your condition. These tests may include: °? Blood tests. °? Urine tests. °? Imaging tests of the abdomen, including X-rays, ultrasounds, MRIs, or CT scans. ° °How is this treated? °Most cases of this condition are mild and can be treated at home.  Treatment may include: °· Taking over-the-counter pain medicines. °· Following a clear liquid diet. °· Taking antibiotic medicines by mouth. °· Rest. ° °More severe cases may need to be treated at a hospital. Treatment may include: °· Not eating or drinking. °· Taking prescription pain medicine. °· Receiving antibiotic medicines through an IV tube. °· Receiving fluids and nutrition through an IV tube. °· Surgery. ° °When your condition is under control, your health care provider may recommend that you have a colonoscopy. This is an exam to look at the entire large intestine. During the exam, a lubricated, bendable tube is inserted into the anus and then passed into the rectum, colon, and other parts of the large intestine. A colonoscopy can show how severe your diverticula are and whether something else may be causing your symptoms. °Follow these instructions at home: °Medicines °· Take over-the-counter and prescription medicines only as told by your health care provider. These include fiber supplements, probiotics, and stool softeners. °· If you were prescribed an antibiotic medicine, take it as told by your health care provider. Do not stop taking the antibiotic even if you start to feel better. °· Do not drive or use heavy machinery while taking prescription pain medicine. °General instructions °· Follow a full liquid diet or another diet as directed by your health care provider. After your symptoms improve, your health care provider may tell you to change your diet. He or she may recommend that you eat a diet that contains at least 25 g (25 grams) of fiber daily. Fiber makes it easier to pass stool. Healthy sources of fiber include: °? Berries. One cup   contains 4-8 grams of fiber. °? Beans or lentils. One half cup contains 5-8 grams of fiber. °? Green vegetables. One cup contains 4 grams of fiber. °· Exercise for at least 30 minutes, 3 times each week. You should exercise hard enough to raise your heart rate and  break a sweat. °· Keep all follow-up visits as told by your health care provider. This is important. You may need a colonoscopy. °Contact a health care provider if: °· Your pain does not improve. °· You have a hard time drinking or eating food. °· Your bowel movements do not return to normal. °Get help right away if: °· Your pain gets worse. °· Your symptoms do not get better with treatment. °· Your symptoms suddenly get worse. °· You have a fever. °· You vomit more than one time. °· You have stools that are bloody, black, or tarry. °Summary °· Diverticulitis is infection or inflammation of small pouches (diverticula) in the colon that form due to a condition called diverticulosis. Diverticula can trap stool (feces) and bacteria, causing infection and inflammation. °· You are at higher risk for this condition if you have diverticulosis and you eat a diet that does not include enough fiber. °· Most cases of this condition are mild and can be treated at home. More severe cases may need to be treated at a hospital. °· When your condition is under control, your health care provider may recommend that you have an exam called a colonoscopy. This exam can show how severe your diverticula are and whether something else may be causing your symptoms. °This information is not intended to replace advice given to you by your health care provider. Make sure you discuss any questions you have with your health care provider. °Document Released: 08/21/2005 Document Revised: 12/14/2016 Document Reviewed: 12/14/2016 °Elsevier Interactive Patient Education © 2018 [employer] ° °

## 2018-07-10 LAB — CMP14+EGFR
A/G RATIO: 1.9 (ref 1.2–2.2)
ALT: 31 IU/L (ref 0–44)
AST: 26 IU/L (ref 0–40)
Albumin: 4.5 g/dL (ref 3.5–5.5)
Alkaline Phosphatase: 73 IU/L (ref 39–117)
BILIRUBIN TOTAL: 0.4 mg/dL (ref 0.0–1.2)
BUN/Creatinine Ratio: 13 (ref 9–20)
BUN: 15 mg/dL (ref 6–20)
CHLORIDE: 103 mmol/L (ref 96–106)
CO2: 22 mmol/L (ref 20–29)
Calcium: 9.4 mg/dL (ref 8.7–10.2)
Creatinine, Ser: 1.13 mg/dL (ref 0.76–1.27)
GFR calc Af Amer: 99 mL/min/{1.73_m2} (ref >59)
GFR calc non Af Amer: 86 mL/min/{1.73_m2} (ref >59)
GLUCOSE: 86 mg/dL (ref 65–99)
Globulin, Total: 2.4 g/dL (ref 1.5–4.5)
POTASSIUM: 4.4 mmol/L (ref 3.5–5.2)
Sodium: 140 mmol/L (ref 134–144)
Total Protein: 6.9 g/dL (ref 6.0–8.5)

## 2018-07-10 LAB — CBC WITH DIFFERENTIAL/PLATELET
BASOS ABS: 0 10*3/uL (ref 0.0–0.2)
BASOS: 1 %
EOS (ABSOLUTE): 0.2 10*3/uL (ref 0.0–0.4)
Eos: 2 %
Hematocrit: 45.1 % (ref 37.5–51.0)
Hemoglobin: 15 g/dL (ref 13.0–17.7)
IMMATURE GRANS (ABS): 0 10*3/uL (ref 0.0–0.1)
IMMATURE GRANULOCYTES: 0 %
LYMPHS: 30 %
Lymphocytes Absolute: 2.6 10*3/uL (ref 0.7–3.1)
MCH: 28 pg (ref 26.6–33.0)
MCHC: 33.3 g/dL (ref 31.5–35.7)
MCV: 84 fL (ref 79–97)
Monocytes Absolute: 0.6 10*3/uL (ref 0.1–0.9)
Monocytes: 7 %
NEUTROS PCT: 60 %
Neutrophils Absolute: 5.2 10*3/uL (ref 1.4–7.0)
PLATELETS: 257 10*3/uL (ref 150–450)
RBC: 5.36 x10E6/uL (ref 4.14–5.80)
RDW: 15.3 % (ref 12.3–15.4)
WBC: 8.7 10*3/uL (ref 3.4–10.8)

## 2018-07-16 ENCOUNTER — Encounter: Payer: Self-pay | Admitting: Gastroenterology

## 2018-07-16 ENCOUNTER — Other Ambulatory Visit: Payer: BLUE CROSS/BLUE SHIELD

## 2018-07-16 ENCOUNTER — Ambulatory Visit (INDEPENDENT_AMBULATORY_CARE_PROVIDER_SITE_OTHER): Payer: BLUE CROSS/BLUE SHIELD | Admitting: Gastroenterology

## 2018-07-16 VITALS — BP 100/70 | HR 97 | Ht 71.0 in | Wt >= 6400 oz

## 2018-07-16 DIAGNOSIS — R109 Unspecified abdominal pain: Secondary | ICD-10-CM

## 2018-07-16 DIAGNOSIS — R197 Diarrhea, unspecified: Secondary | ICD-10-CM

## 2018-07-16 DIAGNOSIS — K921 Melena: Secondary | ICD-10-CM

## 2018-07-16 DIAGNOSIS — R7989 Other specified abnormal findings of blood chemistry: Secondary | ICD-10-CM

## 2018-07-16 DIAGNOSIS — R110 Nausea: Secondary | ICD-10-CM | POA: Diagnosis not present

## 2018-07-16 DIAGNOSIS — K219 Gastro-esophageal reflux disease without esophagitis: Secondary | ICD-10-CM

## 2018-07-16 MED ORDER — DICYCLOMINE HCL 10 MG PO CAPS: 10.0000 mg | ORAL_CAPSULE | Freq: Three times a day (TID) | ORAL | 11 refills | Status: DC

## 2018-07-16 NOTE — Patient Instructions (Signed)
If you are age 32 or older, your body mass index should be between 23-30. Your Body mass index is 57.62 kg/m. If this is out of the aforementioned range listed, please consider follow up with your Primary Care Provider.  If you are age 48 or younger, your body mass index should be between 19-25. Your Body mass index is 57.62 kg/m. If this is out of the aformentioned range listed, please consider follow up with your Primary Care Provider.   We have sent the following medications to your pharmacy for you to pick up at your convenience: Bentyl 10mg  by mouth four times daily.  It was a pleasure to see you today!  Vito Cirigliano, D.O.

## 2018-07-16 NOTE — Progress Notes (Signed)
Chief Complaint: Abdominal pain, diarrhea, nausea   Referring Provider:     Sharion Balloon, FNP    HPI:     [name] is a 32 y.o. male referred to the Gastroenterology Clinic for evaluation of abdominal pain.  He states he had onset of lower abdominal pain approximately 2 to 3 weeks ago. + nausea and +diarrhea with blood mixed in stool with onset of symptoms. Diarrhea lasted 3 days. Was evaluated in [facility] ER for pain, was referred to his PCM on 07/09/18 and was started on Cipro/Flagyl. Diarrhea has since stopped but pain has persisted. No change in location or intensity of pain with Abx. Will complete Abx tomorrow. No associated fever. Hematochezia has decreased in volume but still persists. Described as blood in stool and toilet water initially and now only on tissue paper.  Denies dyschezia.  Nausea resolved. Toleraring all PO intake.  No changes in weight with symptoms.  Recent evaluation on 07/09/18 notable for: mildly elevated creat at 1.13 (was 0.95 in July 2019), normal LAEs, normal CBC, TSH. A CT abd/pelvis was ordered by [facility] and scheduled for 07/24/18.Previous CT from 01/23/18 (ordered for nausea, right flank pain) notable for enlarged liver up to 21 cm with o/w normal appeaering ducts, no stones, no masses, normal appearing parenchyma, normal pancreas, and normal appearing stomach, SI, colon.   Extensive evaluation [place] [id number] with [facility] for abdominal pain, change in stools, hematochezia and FOBT+ stools as outlined below.  Aside from father with colon polyps, no known family history of CRC, GI malignancy, liver disease, pancreatic disease, or IBD.   Hx of GERD, which is well controlled with Protonix 40 mg PO BID. Has breakthrough symptoms when decreasing to daily dosing.   Endoscopic history: -Colonoscopy (08/26/2007): Normal colon, rectum, terminal ileum -EGD (08/26/2007): Normal esophagus, small hiatal hernia, normal stomach, normal D1-D3 -EGD  (01/09/2009) distal esophageal erosions consistent with mild erosive reflux esophagitis, otherwise normal esophagus, small hiatal hernia, normal stomach, normal D1-D2 -EGD (04/07/2009): Normal esophagus, small hiatal hernia -Small bowel capsule (04/11/2009): Normal -Colonoscopy (10/10/2009): Anal papilla otherwise normal  Past Medical History:  Diagnosis Date  . ADHD (attention deficit hyperactivity disorder)   . Anxiety   . Bipolar disorder ([place])   . Chronic lower back pain   . Depression   . Esophagitis    Distal esophageal erosions consistent with mild erosive  reflux esophagitis 12/2008 by EGD   . Gastric ulcer   . GERD (gastroesophageal reflux disease)   . Hiatal hernia   . History of stomach ulcers   . Hx MRSA infection 8/08   right thigh  . Hypercholesteremia    denies  . Obesity   . Occult GI bleeding 12/2008   Trivial upper GI bleed/uncontrolled GERD by upper endoscopy 12/2008, normal f/u endoscopy 03/2009 with SBCE at that time  . OSA on CPAP   . Pain management   . Pneumonia 09/01/2015   "on ATB still" (09/04/2015)  . S/P colonoscopy 11/10, 10/08   Dr [name]  . Schizophrenia [facility])   . Thyroid function test abnormal    Noted in 2011 discharge  . Tobacco dipper      Past Surgical History:  Procedure Laterality Date  . ANKLE FRACTURE SURGERY Left ~ 2008  . BACK SURGERY    . COLONOSCOPY  10/10/2009   anal papilla otherwise normal  . ESOPHAGOGASTRODUODENOSCOPY   04/07/2009   Normal esophagus, small hiatal  hernia  . ESOPHAGOGASTRODUODENOSCOPY  01/09/2009   Distal esophageal erosions consistent with mild erosive reflux esophagitis, otherwise normal esophagus, small hiatal herniaotherwise normal stomach, D1-D2   . ESOPHAGOGASTRODUODENOSCOPY  08/26/2007   Normal esophagus, a small hiatal/hernia, otherwise normal stomach D1 through D3  . FRACTURE SURGERY    . ileocolonoscopy  08/26/2007    A normal rectum, colon, and terminal ileum  . INCISION AND DRAINAGE   09/04/2015   "reopened my back incsion"  . LUMBAR LAMINECTOMY/DECOMPRESSION MICRODISCECTOMY Left 09/23/2017   Procedure: Microdiscectomy - left - Lumbar four-Lumbar five;  Surgeon: Earnie Larsson, MD;  Location: [facility];  Service: Neurosurgery;  Laterality: Left;  . LUMBAR MICRODISCECTOMY  08/21/2015  . LUMBAR MICRODISCECTOMY Left 09/23/2017   L4-5  . LUMBAR WOUND DEBRIDEMENT N/A 09/04/2015   Procedure: LUMBAR WOUND DEBRIDEMENT;  Surgeon: Consuella Lose, MD;  Location: [facility];  Service: Neurosurgery;  Laterality: N/A;  . right side sugery     gland removed  . Small bowel capsule  04/11/2009    normal throughout  . VASECTOMY     Family History  Problem Relation Age of Onset  . Leukemia Father 58  . Seizures Mother   . Bipolar disorder Brother   . ADD / ADHD Brother   . Colon cancer Neg Hx    Social History   Tobacco Use  . Smoking status: Never Smoker  . Smokeless tobacco: Current User    Types: Chew  Substance Use Topics  . Alcohol use: No    Alcohol/week: 0.0 standard drinks  . Drug use: No   Current Outpatient Medications  Medication Sig Dispense Refill  . albuterol (PROVENTIL HFA;VENTOLIN HFA) 108 (90 Base) MCG/ACT inhaler Inhale 1-2 puffs into the lungs every 6 (six) hours as needed for wheezing or shortness of breath. 1 Inhaler 0  . cetirizine (ZYRTEC) 10 MG tablet TAKE ONE TABLET BY MOUTH ONCE DAILY 30 tablet 5  . ciprofloxacin (CIPRO) 500 MG tablet Take 1 tablet (500 mg total) by mouth 2 (two) times daily. 14 tablet 0  . cyclobenzaprine (FLEXERIL) 10 MG tablet Take 1 tablet (10 mg total) by mouth 3 (three) times daily as needed for muscle spasms. 20 tablet 0  . docusate sodium (COLACE) 100 MG capsule Take 100 mg by mouth daily as needed for mild constipation. Bedtime     . doxepin (SINEQUAN) 25 MG capsule Take 25 mg by mouth at bedtime.     [name] Kitchen EPINEPHrine 0.3 mg/0.3 mL IJ SOAJ injection Inject 0.3 mg into the muscle as needed (allergic reactions).     . lamoTRIgine  (LAMICTAL) 200 MG tablet Take 200 mg by mouth at bedtime.    [name] Kitchen lisdexamfetamine (VYVANSE) 60 MG capsule Take 60 mg by mouth daily.     . metroNIDAZOLE (FLAGYL) 500 MG tablet Take 1 tablet (500 mg total) by mouth 3 (three) times daily. 24 tablet 0  . pantoprazole (PROTONIX) 40 MG tablet TAKE 1 TABLET BY MOUTH TWICE DAILY 180 tablet 0   No current facility-administered medications for this visit.    Allergies  Allergen Reactions  . Ibuprofen Other (See Comments)    Makes ulcers bleed  . Influenza Vaccines Shortness Of Breath and Other (See Comments)    Rash and unable to breathe well  . Tylenol [Acetaminophen] Other (See Comments)    Bleeding ulcers  . Bee Venom Swelling    SWELLING REACTION UNSPECIFIED   . Tramadol Itching     Review of Systems: All systems reviewed and negative  except where noted in HPI.     Physical Exam:    Wt Readings from Last 3 Encounters:  07/16/18 (!) 413 lb 2 oz (187.4 kg)  07/09/18 (!) 409 lb 12.8 oz (185.9 kg)  07/07/18 (!) 406 lb (184.2 kg)    Ht 5\' 11"  (1.803 m)   Wt (!) 413 lb 2 oz (187.4 kg)   BMI 57.62 kg/m  Constitutional:  Pleasant, in no acute distress. Psychiatric: Normal mood and affect. Behavior is normal. EENT: Pupils normal.  Conjunctivae are normal. No scleral icterus. Neck supple. No cervical LAD. Cardiovascular: Normal rate, regular rhythm. No edema Pulmonary/chest: Effort normal and breath sounds normal. No wheezing, rales or rhonchi. Abdominal: Mild TTP to deep palpation in bilateral lower quadrants.  No rebound or guarding.  No peritoneal signs.  Negative Murphy's.  Otherwise soft, nondistended, Bowel sounds active throughout. There are no masses palpable. Neurological: Alert and oriented to person place and time. Skin: Skin is warm and dry. No rashes noted. Rectal: Declined   ASSESSMENT AND PLAN;   [name] is a 32 y.o. male presenting with recent onset abdominal pain, nausea, hematochezia, and diarhea.   1)  Diarrhea Suspect infectious enterocolitis given acute clinical presentation and response to therapy.  - Complete Abx as prescribed - As sxs now resolved and completing ABx, no plan for stool studies at this juncture - Resume adequate hydration. Creat mildly elevated on last week's labs c/w volume depletion. Repeat BMP in 2-3 weeks  2) Abdominal pain: Suspect also related to acute infectious enterocolitis as above.  - Bentyl prn Q6 hours for abdominal pain - If sxs persist, and depending on CT results scheduled for next week, can consider colonoscopy to eval for ongoing mucosal/luminal etiology.   3) Hematochezia: - Suspect exacerbation of benign anorectal disease (ie, hemorrhoids) related to infection as above. Sxs improving now. Discussed this at length with the patient, and offered colonoscopy now vs waiting to see if sxs resolve as he continues to improve clinically, and he would prefer to hold off at this time. However, if sxs persist, or depending on CT findings as above, can plan on colonoscopy to eval for more proximal etiology. He agrees to inform me if sxs do not resolve  4) Nausea: Resolved - Resume PO as tolerated  5) Hx of GERD: - Well controlled with Protonix 40 mg PO BID. No refills needed at this time  6) elevated creatinine: - Suspect related to volume loss during recent acute infectious enterocolitis. -Repeat BMP in 2 to 3 weeks now that tolerating all p.o. intake and no longer with nausea or diarrhea.    Lavena Bullion, DO, FACG  07/16/2018, 8:18 AM   Sharion Balloon, FNP

## 2018-07-24 ENCOUNTER — Encounter (HOSPITAL_COMMUNITY): Payer: Self-pay

## 2018-07-24 ENCOUNTER — Ambulatory Visit (HOSPITAL_COMMUNITY)
Admission: RE | Admit: 2018-07-24 | Discharge: 2018-07-24 | Disposition: A | Discharge status: Home / Self Care | Payer: BLUE CROSS/BLUE SHIELD | Source: Ambulatory Visit | Attending: Family | Admitting: Family

## 2018-07-24 DIAGNOSIS — R197 Diarrhea, unspecified: Secondary | ICD-10-CM | POA: Insufficient documentation

## 2018-07-24 DIAGNOSIS — R1032 Left lower quadrant pain: Secondary | ICD-10-CM | POA: Diagnosis not present

## 2018-07-24 DIAGNOSIS — K921 Melena: Secondary | ICD-10-CM | POA: Insufficient documentation

## 2018-07-24 MED ORDER — IOPAMIDOL (ISOVUE-300) INJECTION 61%
100.0000 mL | Freq: Once | INTRAVENOUS | Status: AC | PRN
  Administered 2018-07-24: 100 mL via INTRAVENOUS

## 2018-07-30 ENCOUNTER — Other Ambulatory Visit: Payer: BLUE CROSS/BLUE SHIELD

## 2018-08-03 ENCOUNTER — Other Ambulatory Visit (INDEPENDENT_AMBULATORY_CARE_PROVIDER_SITE_OTHER): Payer: BLUE CROSS/BLUE SHIELD

## 2018-08-03 DIAGNOSIS — R110 Nausea: Secondary | ICD-10-CM

## 2018-08-03 DIAGNOSIS — K921 Melena: Secondary | ICD-10-CM | POA: Diagnosis not present

## 2018-08-03 DIAGNOSIS — R197 Diarrhea, unspecified: Secondary | ICD-10-CM

## 2018-08-03 DIAGNOSIS — R109 Unspecified abdominal pain: Secondary | ICD-10-CM | POA: Diagnosis not present

## 2018-08-03 DIAGNOSIS — Z6841 Body Mass Index (BMI) 40.0 and over, adult: Secondary | ICD-10-CM | POA: Diagnosis not present

## 2018-08-03 DIAGNOSIS — H00015 Hordeolum externum left lower eyelid: Secondary | ICD-10-CM | POA: Diagnosis not present

## 2018-08-03 DIAGNOSIS — K219 Gastro-esophageal reflux disease without esophagitis: Secondary | ICD-10-CM

## 2018-08-03 LAB — BASIC METABOLIC PANEL
BUN: 9 mg/dL (ref 6–23)
CALCIUM: 9.2 mg/dL (ref 8.4–10.5)
CHLORIDE: 101 meq/L (ref 96–112)
CO2: 29 mEq/L (ref 19–32)
CREATININE: 0.98 mg/dL (ref 0.40–1.50)
GFR: 93.81 mL/min (ref >60.00)
Glucose, Bld: 92 mg/dL (ref 70–99)
Potassium: 4.2 mEq/L (ref 3.5–5.1)
Sodium: 135 mEq/L (ref 135–145)

## 2018-08-03 NOTE — Progress Notes (Signed)
Results reviewed and the following message was sent to the patient through MyChart:   Your repeat chemistry panel is normal and shows the previous acute kidney injury (from diarrhea) has resolved. No further action needed for this result. Please do not hesitate to contact our GI Clinic with questions or concerns.   Gerrit Heck, DO, [facility]

## 2018-08-12 DIAGNOSIS — F3181 Bipolar II disorder: Secondary | ICD-10-CM | POA: Diagnosis not present

## 2018-08-19 ENCOUNTER — Ambulatory Visit (INDEPENDENT_AMBULATORY_CARE_PROVIDER_SITE_OTHER): Payer: BLUE CROSS/BLUE SHIELD | Admitting: Family Medicine

## 2018-08-19 ENCOUNTER — Encounter: Payer: Self-pay | Admitting: Family Medicine

## 2018-08-19 VITALS — BP 122/81 | HR 91 | Temp 97.8°F | Ht 71.0 in | Wt >= 6400 oz

## 2018-08-19 DIAGNOSIS — R7989 Other specified abnormal findings of blood chemistry: Secondary | ICD-10-CM

## 2018-08-19 DIAGNOSIS — H1031 Unspecified acute conjunctivitis, right eye: Secondary | ICD-10-CM | POA: Diagnosis not present

## 2018-08-19 MED ORDER — POLYMYXIN B-TRIMETHOPRIM 10000-0.1 UNIT/ML-% OP SOLN: 1.0000 [drp] | Freq: Four times a day (QID) | OPHTHALMIC | 0 refills | Status: DC

## 2018-08-19 NOTE — Progress Notes (Signed)
Subjective: CC: [place] PCP: [name], DO [name] is a 32 y.o. male presenting to clinic today for:  1.  [place] Patient reports a 1 day history of redness in his right eye with associated foreign body sensation.  He woke up this morning with matted eyelashes.  He denies any overt burning or pain with extraocular movement.  No visual disturbance.  Past medical history is significant for ulcer in his eye.  He is a contact lens wear but is not currently wearing his contacts.  Denies having had any trauma to the eye or any actual visualized foreign bodies acquired into the eye.  He is followed by Dr. Marin Comment at [facility] for his vision.  2.  Abnormal thyroid test Patient was last seen about a month and a half ago for hospital follow-up visit.  He has been noted to have an abnormality in his TSH at that time.  To summarize, he has had history of low thyroid in the past and been on replacement up until about 10 years ago because he was told he no longer needed the medication.  He has poor energy, problems with weight loss and constipation alternating with diarrhea.  There is a strong family history of thyroid disorders on his father side.  Again, no radiation or neck surgeries.   ROS: Per HPI  Allergies  Allergen Reactions  . Ibuprofen Other (See Comments)    Makes ulcers bleed  . Influenza Vaccines Shortness Of Breath and Other (See Comments)    Rash and unable to breathe well  . Tylenol [Acetaminophen] Other (See Comments)    Bleeding ulcers  . Bee Venom Swelling    SWELLING REACTION UNSPECIFIED   . Tramadol Itching   Past Medical History:  Diagnosis Date  . ADHD (attention deficit hyperactivity disorder)   . Anal fissure   . Anxiety   . Bipolar disorder ([place])   . Chronic lower back pain   . Depression   . Esophagitis    Distal esophageal erosions consistent with mild erosive  reflux esophagitis 12/2008 by EGD   . Gastric ulcer   . GERD (gastroesophageal reflux  disease)   . Hiatal hernia   . History of stomach ulcers   . Hx MRSA infection 8/08   right thigh  . Hx of colonic polyps   . Hypercholesteremia    denies  . Irritable bowel syndrome (IBS)   . Obesity   . Occult GI bleeding 12/2008   Trivial upper GI bleed/uncontrolled GERD by upper endoscopy 12/2008, normal f/u endoscopy 03/2009 with SBCE at that time  . OSA on CPAP   . Pain management   . Pneumonia 09/01/2015   "on ATB still" (09/04/2015)  . S/P colonoscopy 11/10, 10/08   Dr [name]  . Schizophrenia [facility])   . Sleep apnea   . Thyroid function test abnormal    Noted in 2011 discharge  . Tobacco dipper     Current Outpatient Medications:  .  albuterol (PROVENTIL HFA;VENTOLIN HFA) 108 (90 Base) MCG/ACT inhaler, Inhale 1-2 puffs into the lungs every 6 (six) hours as needed for wheezing or shortness of breath., Disp: 1 Inhaler, Rfl: 0 .  cyclobenzaprine (FLEXERIL) 10 MG tablet, Take 1 tablet (10 mg total) by mouth 3 (three) times daily as needed for muscle spasms., Disp: 20 tablet, Rfl: 0 .  dicyclomine (BENTYL) 10 MG capsule, Take 1 capsule (10 mg total) by mouth 4 (four) times daily -  before meals and at bedtime.,  Disp: 120 capsule, Rfl: 11 .  docusate sodium (COLACE) 100 MG capsule, Take 100 mg by mouth daily as needed for mild constipation. Bedtime , Disp: , Rfl:  .  EPINEPHrine 0.3 mg/0.3 mL IJ SOAJ injection, Inject 0.3 mg into the muscle as needed (allergic reactions). , Disp: , Rfl:  .  pantoprazole (PROTONIX) 40 MG tablet, TAKE 1 TABLET BY MOUTH TWICE DAILY, Disp: 180 tablet, Rfl: 0 Social History   Socioeconomic History  . Marital status: Married    Spouse name: Not on file  . Number of children: 2  . Years of education: Not on file  . Highest education level: Not on file  Occupational History  . Occupation: Chief Financial Officer: [employer]  . Occupation: Hydrologist  Social Needs  . Financial resource strain: Not on file  . Food insecurity:     Worry: Not on file    Inability: Not on file  . Transportation needs:    Medical: Not on file    Non-medical: Not on file  Tobacco Use  . Smoking status: Never Smoker  . Smokeless tobacco: Current User    Types: Chew  Substance and Sexual Activity  . Alcohol use: No    Alcohol/week: 0.0 standard drinks  . Drug use: No  . Sexual activity: Not Currently  Lifestyle  . Physical activity:    Days per week: Not on file    Minutes per session: Not on file  . Stress: Not on file  Relationships  . Social connections:    Talks on phone: Not on file    Gets together: Not on file    Attends religious service: Not on file    Active member of club or organization: Not on file    Attends meetings of clubs or organizations: Not on file    Relationship status: Not on file  . Intimate partner violence:    Fear of current or ex partner: Not on file    Emotionally abused: Not on file    Physically abused: Not on file    Forced sexual activity: Not on file  Other Topics Concern  . Not on file  Social History Narrative  . Not on file   Family History  Problem Relation Age of Onset  . Leukemia Father 98  . Colon polyps Father   . Clotting disorder Father   . Seizures Mother   . Irritable bowel syndrome Mother   . Bipolar disorder Brother   . ADD / ADHD Brother   . Diabetes Paternal Grandmother   . Ulcerative colitis Paternal Aunt   . Colon cancer Neg Hx   . Liver cancer Neg Hx   . Esophageal cancer Neg Hx     Objective: Office vital signs reviewed. BP 122/81   Pulse 91   Temp 97.8 F (36.6 C) (Oral)   Ht 5\' 11"  (1.803 m)   Wt (!) 411 lb (186.4 kg)   BMI 57.32 kg/m   Physical Examination:  General: Awake, alert, obese, No acute distress HEENT: Normal    Neck: No goiter    Eyes: Right eye with mild conjunctival injection.  No gross drainage.  No appreciable foreign bodies within the eye.  Extraocular movement is intact and he has no pain with extraocular movement.   PERRLA. Cardio: regular rate  Pulm:  normal work of breathing on room air Neuro: No tremor noted.  Fluorescein stain exam did not demonstrate any ulcerations or corneal abrasions.  Assessment/  Plan: 32 y.o. male   1. Acute conjunctivitis of right eye, unspecified acute conjunctivitis type Possibly viral.  However, given history of ocular ulcer, I am empirically treating him with Polytrim eyedrops to use 4 times daily for the next 7 days.  Home care instructions were reviewed.  Reasons for emergent evaluation or reevaluation by his eye doctor discussed.  He voiced good understanding and will follow-up PRN.  2. Abnormal TSH At last visit, TSH was within normal limits.  Will repeat this level per his request. - TSH   Orders Placed This Encounter  Procedures  . TSH   Meds ordered this encounter  Medications  . trimethoprim-polymyxin b (POLYTRIM) ophthalmic solution    Sig: Place 1 drop into the right eye every 6 (six) hours for 7 days.    Dispense:  10 mL    Refill:  [place], DO [place] [phone]

## 2018-08-19 NOTE — Patient Instructions (Signed)
I have sent in a antibiotic drop for you to put in your right eye every 6 hours for the next 7 days.  If you continue to have foreign body sensation, eye drainage or any burning in the eye, I do want you going back to see Dr. Marin Comment to make sure that she does not see a tiny/ microscopic foreign body that was not appreciated on your eye exam today.  Do not resume wearing contacts until your eye symptoms are totally resolved and you have finished her antibiotic.  Bacterial Conjunctivitis Bacterial conjunctivitis is an infection of your conjunctiva. This is the clear membrane that covers the white part of your eye and the inner surface of your eyelid. This condition can make your eye:  Red or pink.  Itchy.  This condition is caused by bacteria. This condition spreads very easily from person to person (is contagious) and from one eye to the other eye. Follow these instructions at home: Medicines  Take or apply your antibiotic medicine as told by your doctor. Do not stop taking or applying the antibiotic even if you start to feel better.  Take or apply over-the-counter and prescription medicines only as told by your doctor.  Do not touch your eyelid with the eye drop bottle or the ointment tube. Managing discomfort  Wipe any fluid from your eye with a warm, wet washcloth or a cotton ball.  Place a cool, clean washcloth on your eye. Do this for 10-20 minutes, 3-4 times per day. General instructions  Do not wear contact lenses until the irritation is gone. Wear glasses until your doctor says it is okay to wear contacts.  Do not wear eye makeup until your symptoms are gone. Throw away any old makeup.  Change or wash your pillowcase every day.  Do not share towels or washcloths with anyone.  Wash your hands often with soap and water. Use paper towels to dry your hands.  Do not touch or rub your eyes.  Do not drive or use heavy machinery if your vision is blurry. Contact a doctor if:  You  have a fever.  Your symptoms do not get better after 10 days. Get help right away if:  You have a fever and your symptoms suddenly get worse.  You have very bad pain when you move your eye.  Your face: ? Hurts. ? Is red. ? Is swollen.  You have sudden loss of vision. This information is not intended to replace advice given to you by your health care provider. Make sure you discuss any questions you have with your health care provider. Document Released: 08/20/2008 Document Revised: 04/18/2016 Document Reviewed: 08/24/2015 Elsevier Interactive Patient Education  [employer].

## 2018-08-20 LAB — TSH: TSH: 6.13 u[IU]/mL — AB (ref 0.450–4.500)

## 2018-08-21 ENCOUNTER — Other Ambulatory Visit: Payer: Self-pay | Admitting: Family Medicine

## 2018-08-21 MED ORDER — LEVOTHYROXINE SODIUM 75 MCG PO TABS: 75.0000 ug | ORAL_TABLET | Freq: Every day | ORAL | 1 refills | Status: DC

## 2018-08-24 ENCOUNTER — Encounter: Payer: Self-pay | Admitting: Pulmonary Disease

## 2018-08-24 ENCOUNTER — Ambulatory Visit (INDEPENDENT_AMBULATORY_CARE_PROVIDER_SITE_OTHER): Payer: BLUE CROSS/BLUE SHIELD | Admitting: Pulmonary Disease

## 2018-08-24 ENCOUNTER — Ambulatory Visit: Payer: Medicaid Other | Admitting: Acute Care

## 2018-08-24 DIAGNOSIS — G4733 Obstructive sleep apnea (adult) (pediatric): Secondary | ICD-10-CM | POA: Diagnosis not present

## 2018-08-24 DIAGNOSIS — F17200 Nicotine dependence, unspecified, uncomplicated: Secondary | ICD-10-CM | POA: Diagnosis not present

## 2018-08-24 NOTE — Progress Notes (Signed)
@Patient  ID: [name], male    DOB: 10-Jun-1986, 32 y.o.   MRN: [id number]  Chief Complaint  Patient presents with  . Follow-up    CPAP follow up, feels CPAP is working well, uses nasal mask, needs new mask,head gear, DME [facility]    Referring provider: Claretta Fraise, MD  HPI:  32 year old male never smoker, smokeless tobacco user followed in our office for obstructive sleep apnea on CPAP  Smoker/ Smoking History: Never smoker.  Smokeless tobacco user Maintenance:  none Pt of: Dr. Annamaria Gardner  Recent [place] [facility] Encounters:   05/20/18-office visit-SG [name]  Said he was started on CPAP 4 to 5 years ago. Plan: Follow-up in 3 months, continue CPAP use  08/24/2018  - Visit   32 year old patient seen today for follow-up for CPAP compliance.  Patient reports that his CPAP use has been going fine.  He does report that he was recently switched to some night shifts which is messed with his compliance.  Patient reports that when he works night shift he typically forgets to apply a CPAP before he gets to sleep.  Patient is requesting new mask today as well as [facility]  CPAP compliance report dated today showing 23 out of 30 days used.  Only 20 of those days greater than 4 hours.  Average usage 6 hours and 19 minutes.  APAP settings 5-15.  AHI 3.7.  Epworth score today is 4.   Tests:  08/03/12-sleep study-AHI 24.1  03/25/2018 Mild obstructive sleep apnea occurred during this study (AHI = 19.1/h). - No significant central sleep apnea occurred during this study (CAI = 0.0/h). - Moderate oxygen desaturation was noted during this study (Min O2 = 73.0%).  Chart Review:     Specialty Problems      Pulmonary Problems   Obstructive sleep apnea    NPSG 08/03/12- AHI 24.1/ hr, moderate OSA. Body weight 360 pounds  03/25/2018 Mild obstructive sleep apnea occurred during this study (AHI = 19.1/h). - No significant central sleep  apnea occurred during this study (CAI = 0.0/h). - Moderate oxygen desaturation was noted during this study (Min O2 = 73.0%).  08/24/18 >>> Epworth score today is 4        Seasonal and perennial allergic rhinitis      Allergies  Allergen Reactions  . Ibuprofen Other (See Comments)    Makes ulcers bleed  . Influenza Vaccines Shortness Of Breath and Other (See Comments)    Rash and unable to breathe well  . Tylenol [Acetaminophen] Other (See Comments)    Bleeding ulcers  . Bee Venom Swelling    SWELLING REACTION UNSPECIFIED   . Tramadol Itching    Immunization History  Administered Date(s) Administered  . Influenza Split 08/25/2010, 08/20/2012, 08/25/2014   >>> Patient refused flu vaccine today stating that he is allergic  Past Medical History:  Diagnosis Date  . ADHD (attention deficit hyperactivity disorder)   . Anal fissure   . Anxiety   . Bipolar disorder ([place])   . Chronic lower back pain   . Depression   . Esophagitis    Distal esophageal erosions consistent with mild erosive  reflux esophagitis 12/2008 by EGD   . Gastric ulcer   . GERD (gastroesophageal reflux disease)   . Hiatal hernia   . History of stomach ulcers   . Hx MRSA infection 8/08   right thigh  . Hx of colonic polyps   . Hypercholesteremia  denies  . Irritable bowel syndrome (IBS)   . Obesity   . Occult GI bleeding 12/2008   Trivial upper GI bleed/uncontrolled GERD by upper endoscopy 12/2008, normal f/u endoscopy 03/2009 with SBCE at that time  . OSA on CPAP   . Pain management   . Pneumonia 09/01/2015   "on ATB still" (09/04/2015)  . S/P colonoscopy 11/10, 10/08   Dr [name]  . Schizophrenia [facility])   . Sleep apnea   . Thyroid function test abnormal    Noted in 2011 discharge  . Tobacco dipper     Tobacco History: Social History   Tobacco Use  Smoking Status Never Smoker  Smokeless Tobacco Current User  . Types: Chew   Ready to quit: Not Answered Counseling given:  Yes  Emphasized the importance the patient he stop using smokeless tobacco.  Patient reports that he is currently working on stopping right now.  Patient was previously using 2 cans a day now is down to 1 can.  Praised patient on improvement on smokeless tobacco use and encouraged him to continue.  Outpatient Encounter Medications as of 08/24/2018  Medication Sig  . cyclobenzaprine (FLEXERIL) 10 MG tablet Take 1 tablet (10 mg total) by mouth 3 (three) times daily as needed for muscle spasms.  [name] Kitchen dicyclomine (BENTYL) 10 MG capsule Take 1 capsule (10 mg total) by mouth 4 (four) times daily -  before meals and at bedtime.  . docusate sodium (COLACE) 100 MG capsule Take 100 mg by mouth daily as needed for mild constipation. Bedtime   . EPINEPHrine 0.3 mg/0.3 mL IJ SOAJ injection Inject 0.3 mg into the muscle as needed (allergic reactions).   [name] Kitchen levothyroxine (SYNTHROID, LEVOTHROID) 75 MCG tablet Take 1 tablet (75 mcg total) by mouth daily.  . pantoprazole (PROTONIX) 40 MG tablet TAKE 1 TABLET BY MOUTH TWICE DAILY  . [DISCONTINUED] albuterol (PROVENTIL HFA;VENTOLIN HFA) 108 (90 Base) MCG/ACT inhaler Inhale 1-2 puffs into the lungs every 6 (six) hours as needed for wheezing or shortness of breath.  . [DISCONTINUED] trimethoprim-polymyxin b (POLYTRIM) ophthalmic solution Place 1 drop into the right eye every 6 (six) hours for 7 days.   No facility-administered encounter medications on file as of 08/24/2018.      Review of Systems  Review of Systems  Constitutional: Positive for fatigue. Negative for activity change, chills, fever and unexpected weight change.  HENT: Negative for postnasal drip, rhinorrhea, sinus pressure, sinus pain, sneezing and sore throat.   Eyes: Negative.   Respiratory: Negative for cough, shortness of breath and wheezing.   Cardiovascular: Negative for chest pain and palpitations.  Gastrointestinal: Negative for constipation, diarrhea, nausea and vomiting.  Endocrine:  Negative.   Musculoskeletal: Negative.   Skin: Negative.   Neurological: Negative for dizziness and headaches (denies morning ha ).  Psychiatric/Behavioral: Positive for sleep disturbance (increased sleepiness when not wearing CPAP ). Negative for dysphoric mood. The patient is not nervous/anxious.   All other systems reviewed and are negative.    Physical Exam  BP 132/82 (BP Location: Left Arm, Cuff Size: Normal)   Pulse 97   Ht 5\' 11"  (1.803 m)   Wt (!) 412 lb 12.8 oz (187.2 kg)   SpO2 95%   BMI 57.57 kg/m   Wt Readings from Last 5 Encounters:  08/24/18 (!) 412 lb 12.8 oz (187.2 kg)  08/19/18 (!) 411 lb (186.4 kg)  07/16/18 (!) 413 lb 2 oz (187.4 kg)  07/09/18 (!) 409 lb 12.8 oz (185.9 kg)  07/07/18 [name] Kitchen)  406 lb (184.2 kg)   >>> Weight is increasing.  Explained to patient this is important in order for [place] to better manage his obstructive sleep apnea.  Patient reports that he will continue to work on diet and exercise.  Physical Exam  Constitutional: He is oriented to person, place, and time and well-developed, well-nourished, and in no distress. No distress.  HENT:  Head: Normocephalic and atraumatic.  Right Ear: Hearing, tympanic membrane, external ear and ear canal normal.  Left Ear: Hearing, tympanic membrane, external ear and ear canal normal.  Nose: Nose normal. Right sinus exhibits no maxillary sinus tenderness and no frontal sinus tenderness. Left sinus exhibits no maxillary sinus tenderness and no frontal sinus tenderness.  Mouth/Throat: Uvula is midline and oropharynx is clear and moist. No oropharyngeal exudate.  Smokeless tobacco throughout mouth on exam.  Eyes: Pupils are equal, round, and reactive to light.  Neck: Normal range of motion. Neck supple. No JVD present.  Cardiovascular: Normal rate, regular rhythm and normal heart sounds.  Pulmonary/Chest: Effort normal and breath sounds normal. No accessory muscle usage. No respiratory distress. He has no decreased  breath sounds. He has no wheezes. He has no rhonchi.  Abdominal: Soft. Bowel sounds are normal. There is tenderness (Currently being evaluated by GI for continued abdominal pain).  Obese  Musculoskeletal: Normal range of motion. He exhibits no edema.  Lymphadenopathy:    He has no cervical adenopathy.  Neurological: He is alert and oriented to person, place, and time. Gait normal.  Skin: Skin is warm and dry. He is not diaphoretic. No erythema.  Psychiatric: Mood, memory, affect and judgment normal.  Nursing note and vitals reviewed.     Lab Results:  CBC    Component Value Date/Time   WBC 8.7 07/09/2018 1113   WBC 8.8 06/18/2018 1305   RBC 5.36 07/09/2018 1113   RBC 5.32 06/18/2018 1305   HGB 15.0 07/09/2018 1113   HCT 45.1 07/09/2018 1113   PLT 257 07/09/2018 1113   MCV 84 07/09/2018 1113   MCH 28.0 07/09/2018 1113   MCH 27.1 06/18/2018 1305   MCHC 33.3 07/09/2018 1113   MCHC 32.4 06/18/2018 1305   RDW 15.3 07/09/2018 1113   LYMPHSABS 2.6 07/09/2018 1113   MONOABS 0.7 10/23/2017 1729   EOSABS 0.2 07/09/2018 1113   BASOSABS 0.0 07/09/2018 1113    BMET    Component Value Date/Time   NA 135 08/03/2018 0711   NA 140 07/09/2018 1113   K 4.2 08/03/2018 0711   CL 101 08/03/2018 0711   CO2 29 08/03/2018 0711   GLUCOSE 92 08/03/2018 0711   BUN 9 08/03/2018 0711   BUN 15 07/09/2018 1113   CREATININE 0.98 08/03/2018 0711   CALCIUM 9.2 08/03/2018 0711   GFRNONAA 86 07/09/2018 1113   GFRAA 99 07/09/2018 1113    BNP No results found for: BNP  ProBNP No results found for: PROBNP  Imaging: No results found.    Assessment & Plan:   32 year old male patient seen today.  We will bring patient back in 3 to 6 months.  Explained to patient he will follow-up sooner if symptoms are not improving or he is having difficulty adhering to CPAP compliance.  We will do 1 month download to check CPAP compliance.  Will [facility]  supplies.  Obstructive sleep apnea Epworth score today is 4  1 month download to check compliance  Will place order to [facility] for mask  of choice, supplies   We recommend that you continue using your CPAP daily >>>Keep up the hard work using your device >>> Goal should be wearing this for the entire night that you are sleeping, at least 4 to 6 hours  Remember:  . Do not drive or operate heavy machinery if tired or drowsy.  . Please notify the supply company and office if you are unable to use your device regularly due to missing supplies or machine being broken.  . Work on maintaining a healthy weight and following your recommended nutrition plan  . Maintain proper daily exercise and movement  . Maintaining proper use of your device can also help improve management of other chronic illnesses such as: Blood pressure, blood sugars, and weight management.   BiPAP/ CPAP Cleaning:  >>>Clean weekly, with Dawn soap, and bottle brush.  Set up to air dry.   You refused a flu vaccine today  Follow-up with our office in the next 3 to 6 months   [place] to decrease your smokeless tobacco use.  Currently at 1 can a day.  Goal to be down to half a can to 0 cans by next office visit.  Obesity, morbid ([place]) Continue to work towards healthy weight Monitor diet     Lauraine Rinne, NP 08/24/2018

## 2018-08-24 NOTE — Patient Instructions (Addendum)
1 month download to check compliance  [facility] choice, supplies   We recommend that you continue using your CPAP daily >>>Keep up the hard work using your device >>> Goal should be wearing this for the entire night that you are sleeping, at least 4 to 6 hours  Remember:  . Do not drive or operate heavy machinery if tired or drowsy.  . Please notify the supply company and office if you are unable to use your device regularly due to missing supplies or machine being broken.  . Work on maintaining a healthy weight and following your recommended nutrition plan  . Maintain proper daily exercise and movement  . Maintaining proper use of your device can also help improve management of other chronic illnesses such as: Blood pressure, blood sugars, and weight management.   BiPAP/ CPAP Cleaning:  >>>Clean weekly, with Dawn soap, and bottle brush.  Set up to air dry.   You refused a flu vaccine today  Continue to work on decreasing her smokeless tobacco use  Follow-up with our office in the next 3 to 6 months   It is flu season:   >>>Remember to be washing your hands regularly, using hand sanitizer, be careful to use around herself with has contact with people who are sick will increase her chances of getting sick yourself. >>> Best ways to protect herself from the flu: Receive the yearly flu vaccine, practice good hand hygiene washing with soap and also using hand sanitizer when available, eat a nutritious meals, get adequate rest, hydrate appropriately    As of 09/28/2018 we will be moving! We will no longer be at our [place] location.   Our new address and phone number will be:  [address] [place], [place] [address] Telephone number: [phone]   Please contact the office if your symptoms worsen or you have concerns that you are not improving.   Thank you for choosing [facility] for your healthcare, and for allowing [place]  to partner with you on your healthcare journey. I am thankful to be able to provide care to you today.   Wyn Quaker FNP-C            Sleep Apnea Sleep apnea is a condition that affects breathing. People with sleep apnea have moments during sleep when their breathing pauses briefly or gets shallow. Sleep apnea can cause these symptoms:  Trouble staying asleep.  Sleepiness or tiredness during the day.  Irritability.  Loud snoring.  Morning headaches.  Trouble concentrating.  Forgetting things.  Less interest in sex.  Being sleepy for no reason.  Mood swings.  Personality changes.  Depression.  Waking up a lot during the night to pee (urinate).  Dry mouth.  Sore throat.  Follow these instructions at home:  Make any changes in your routine that your doctor recommends.  Eat a healthy, well-balanced diet.  Take over-the-counter and prescription medicines only as told by your doctor.  Avoid using alcohol, calming medicines (sedatives), and narcotic medicines.  Take steps to lose weight if you are overweight.  If you were given a machine (device) to use while you sleep, use it only as told by your doctor.  Do not use any tobacco products, such as cigarettes, chewing tobacco, and e-cigarettes. If you need help quitting, ask your doctor.  Keep all follow-up visits as told by your doctor. This is important. Contact a doctor if:  The machine that  you were given to use during sleep is uncomfortable or does not seem to be working.  Your symptoms do not get better.  Your symptoms get worse. Get help right away if:  Your chest hurts.  You have trouble breathing in enough air (shortness of breath).  You have an uncomfortable feeling in your back, arms, or stomach.  You have trouble talking.  One side of your body feels weak.  A part of your face is hanging down (drooping). These symptoms may be an emergency. Do not wait to see if the symptoms will go  away. Get medical help right away. Call your local emergency services (911 in the [place] Do not drive yourself to the hospital. This information is not intended to replace advice given to you by your health care provider. Make sure you discuss any questions you have with your health care provider. Document Released: 08/20/2008 Document Revised: 07/07/2016 Document Reviewed: 08/21/2015 Elsevier Interactive Patient Education  2018 [employer].  CPAP and BiPAP Information CPAP and BiPAP are methods of helping a person breathe with the use of air pressure. CPAP stands for "continuous positive airway pressure." BiPAP stands for "bi-level positive airway pressure." In both methods, air is blown through your nose or mouth and into your air passages to help you breathe well. CPAP and BiPAP use different amounts of pressure to blow air. With CPAP, the amount of pressure stays the same while you breathe in and out. With BiPAP, the amount of pressure is increased when you breathe in (inhale) so that you can take larger breaths. Your health care provider will recommend whether CPAP or BiPAP would be more helpful for you. Why are CPAP and BiPAP treatments used? CPAP or BiPAP can be helpful if you have:  Sleep apnea.  Chronic obstructive pulmonary disease (COPD).  Heart failure.  Medical conditions that weaken the muscles of the chest including muscular dystrophy, or neurological diseases such as amyotrophic lateral sclerosis (ALS).  Other problems that cause breathing to be weak, abnormal, or difficult.  CPAP is most commonly used for obstructive sleep apnea (OSA) to keep the airways from collapsing when the muscles relax during sleep. How is CPAP or BiPAP administered? Both CPAP and BiPAP are provided by a small machine with a flexible plastic tube that attaches to a plastic mask. You wear the mask. Air is blown through the mask into your nose or mouth. The amount of pressure that is used to blow the  air can be adjusted on the machine. Your health care provider will determine the pressure setting that should be used based on your individual needs. When should CPAP or BiPAP be used? In most cases, the mask only needs to be worn during sleep. Generally, the mask needs to be worn throughout the night and during any daytime naps. People with certain medical conditions may also need to wear the mask at other times when they are awake. Follow instructions from your health care provider about when to use the machine. What are some tips for using the mask?  Because the mask needs to be snug, some people feel trapped or closed-in (claustrophobic) when first using the mask. If you feel this way, you may need to get used to the mask. One way to do this is by holding the mask loosely over your nose or mouth and then gradually applying the mask more snugly. You can also gradually increase the amount of time that you use the mask.  Masks are available in  various types and sizes. Some fit over your mouth and nose while others fit over just your nose. If your mask does not fit well, talk with your health care provider about getting a different one.  If you are using a mask that fits over your nose and you tend to breathe through your mouth, a chin strap may be applied to help keep your mouth closed.  The CPAP and BiPAP machines have alarms that may sound if the mask comes off or develops a leak.  If you have trouble with the mask, it is very important that you talk with your health care provider about finding a way to make the mask easier to tolerate. Do not stop using the mask. Stopping the use of the mask could have a negative impact on your health. What are some tips for using the machine?  Place your CPAP or BiPAP machine on a secure table or stand near an electrical outlet.  Know where the on/off switch is located on the machine.  Follow instructions from your health care provider about how to set the  pressure on your machine and when you should use it.  Do not eat or drink while the CPAP or BiPAP machine is on. Food or fluids could get pushed into your lungs by the pressure of the CPAP or BiPAP.  Do not smoke. Tobacco smoke residue can damage the machine.  For home use, CPAP and BiPAP machines can be rented or purchased through home health care companies. Many different brands of machines are available. Renting a machine before purchasing may help you find out which particular machine works well for you.  Keep the CPAP or BiPAP machine and attachments clean. Ask your health care provider for specific instructions. Get help right away if:  You have redness or open areas around your nose or mouth where the mask fits.  You have trouble using the CPAP or BiPAP machine.  You cannot tolerate wearing the CPAP or BiPAP mask.  You have pain, discomfort, and bloating in your abdomen. Summary  CPAP and BiPAP are methods of helping a person breathe with the use of air pressure.  Both CPAP and BiPAP are provided by a small machine with a flexible plastic tube that attaches to a plastic mask.  If you have trouble with the mask, it is very important that you talk with your health care provider about finding a way to make the mask easier to tolerate. This information is not intended to replace advice given to you by your health care provider. Make sure you discuss any questions you have with your health care provider. Document Released: 08/09/2004 Document Revised: 09/30/2016 Document Reviewed: 09/30/2016 Elsevier Interactive Patient Education  2017 [employer].

## 2018-08-24 NOTE — Assessment & Plan Note (Addendum)
Epworth score today is 4  1 month download to check compliance  [facility], supplies   We recommend that you continue using your CPAP daily >>>Keep up the hard work using your device >>> Goal should be wearing this for the entire night that you are sleeping, at least 4 to 6 hours  Remember:  . Do not drive or operate heavy machinery if tired or drowsy.  . Please notify the supply company and office if you are unable to use your device regularly due to missing supplies or machine being broken.  . Work on maintaining a healthy weight and following your recommended nutrition plan  . Maintain proper daily exercise and movement  . Maintaining proper use of your device can also help improve management of other chronic illnesses such as: Blood pressure, blood sugars, and weight management.   BiPAP/ CPAP Cleaning:  >>>Clean weekly, with Dawn soap, and bottle brush.  Set up to air dry.   You refused a flu vaccine today  Follow-up with our office in the next 3 to 6 months

## 2018-08-24 NOTE — Progress Notes (Deleted)
History of Present Illness [name] is a 32 y.o. male never smoker with OSA on CPAP therapy. He is followed by Dr. Annamaria Boots.   08/24/2018 Follow Up Appointment CPAP: Pt. Presents for follow up. He was seen 3 months ago with good compliance. His Max pressure at that time based on down Load  was 14.2 cm at the time ( He is currently on Auto set 5-15 cm pressure.) We discussed the possibility of increasing pressures to 5-18 cm, but he preferred to wait until follow up before making the change. Pt. States he has been doing well on his CPAP.   Test Results:  Down Load: 08/24/2018       Split Night Study 03/25/2018 - Mild obstructive sleep apnea occurred during this study (AHI = 19.1/h). - No significant central sleep apnea occurred during this study (CAI = 0.0/h). - Moderate oxygen desaturation was noted during this study (Min O2 = 73.0%).  CBC Latest Ref Rng & Units 07/09/2018 06/18/2018 01/23/2018  WBC 3.4 - 10.8 x10E3/uL 8.7 8.8 11.7(H)  Hemoglobin 13.0 - 17.7 g/dL 15.0 14.4 14.9  Hematocrit 37.5 - 51.0 % 45.1 44.4 44.0  Platelets 150 - 450 x10E3/uL 257 260 294    BMP Latest Ref Rng & Units 08/03/2018 07/09/2018 06/18/2018  Glucose 70 - 99 mg/dL 92 86 104(H)  BUN 6 - 23 mg/dL 9 15 12   Creatinine 0.40 - 1.50 mg/dL 0.98 1.13 0.95  BUN/Creat Ratio 9 - 20 - 13 -  Sodium 135 - 145 mEq/L 135 140 139  Potassium 3.5 - 5.1 mEq/L 4.2 4.4 3.9  Chloride 96 - 112 mEq/L 101 103 104  CO2 19 - 32 mEq/L 29 22 28   Calcium 8.4 - 10.5 mg/dL 9.2 9.4 9.0    BNP No results found for: BNP  ProBNP No results found for: PROBNP  PFT No results found for: FEV1PRE, FEV1POST, FVCPRE, FVCPOST, TLC, DLCOUNC, PREFEV1FVCRT, PSTFEV1FVCRT  No results found.   Past medical hx Past Medical History:  Diagnosis Date  . ADHD (attention deficit hyperactivity disorder)   . Anal fissure   . Anxiety   . Bipolar disorder ([place])   . Chronic lower back pain   . Depression   . Esophagitis    Distal  esophageal erosions consistent with mild erosive  reflux esophagitis 12/2008 by EGD   . Gastric ulcer   . GERD (gastroesophageal reflux disease)   . Hiatal hernia   . History of stomach ulcers   . Hx MRSA infection 8/08   right thigh  . Hx of colonic polyps   . Hypercholesteremia    denies  . Irritable bowel syndrome (IBS)   . Obesity   . Occult GI bleeding 12/2008   Trivial upper GI bleed/uncontrolled GERD by upper endoscopy 12/2008, normal f/u endoscopy 03/2009 with SBCE at that time  . OSA on CPAP   . Pain management   . Pneumonia 09/01/2015   "on ATB still" (09/04/2015)  . S/P colonoscopy 11/10, 10/08   Dr [name]  . Schizophrenia [facility])   . Sleep apnea   . Thyroid function test abnormal    Noted in 2011 discharge  . Tobacco dipper      Social History   Tobacco Use  . Smoking status: Never Smoker  . Smokeless tobacco: Current User    Types: Chew  Substance Use Topics  . Alcohol use: No    Alcohol/week: 0.0 standard drinks  . Drug use: No    Mr.Lindroth reports that he  has never smoked. His smokeless tobacco use includes chew. He reports that he does not drink alcohol or use drugs.  Tobacco Cessation: Ready to quit: Not Answered Counseling given: Not Answered   Past surgical hx, Family hx, Social hx all reviewed.  Current Outpatient Medications on File Prior to Visit  Medication Sig  . albuterol (PROVENTIL HFA;VENTOLIN HFA) 108 (90 Base) MCG/ACT inhaler Inhale 1-2 puffs into the lungs every 6 (six) hours as needed for wheezing or shortness of breath.  . cyclobenzaprine (FLEXERIL) 10 MG tablet Take 1 tablet (10 mg total) by mouth 3 (three) times daily as needed for muscle spasms.  [name] Kitchen dicyclomine (BENTYL) 10 MG capsule Take 1 capsule (10 mg total) by mouth 4 (four) times daily -  before meals and at bedtime.  . docusate sodium (COLACE) 100 MG capsule Take 100 mg by mouth daily as needed for mild constipation. Bedtime   . EPINEPHrine 0.3 mg/0.3 mL IJ SOAJ injection  Inject 0.3 mg into the muscle as needed (allergic reactions).   [name] Kitchen levothyroxine (SYNTHROID, LEVOTHROID) 75 MCG tablet Take 1 tablet (75 mcg total) by mouth daily.  . pantoprazole (PROTONIX) 40 MG tablet TAKE 1 TABLET BY MOUTH TWICE DAILY  . trimethoprim-polymyxin b (POLYTRIM) ophthalmic solution Place 1 drop into the right eye every 6 (six) hours for 7 days.   No current facility-administered medications on file prior to visit.      Allergies  Allergen Reactions  . Ibuprofen Other (See Comments)    Makes ulcers bleed  . Influenza Vaccines Shortness Of Breath and Other (See Comments)    Rash and unable to breathe well  . Tylenol [Acetaminophen] Other (See Comments)    Bleeding ulcers  . Bee Venom Swelling    SWELLING REACTION UNSPECIFIED   . Tramadol Itching    Review Of Systems:  Constitutional:   No  weight loss, night sweats,  Fevers, chills, fatigue, or  lassitude.  HEENT:   No headaches,  Difficulty swallowing,  Tooth/dental problems, or  Sore throat,                No sneezing, itching, ear ache, nasal congestion, post nasal drip,   CV:  No chest pain,  Orthopnea, PND, swelling in lower extremities, anasarca, dizziness, palpitations, syncope.   GI  No heartburn, indigestion, abdominal pain, nausea, vomiting, diarrhea, change in bowel habits, loss of appetite, bloody stools.   Resp: No shortness of breath with exertion or at rest.  No excess mucus, no productive cough,  No non-productive cough,  No coughing up of blood.  No change in color of mucus.  No wheezing.  No chest wall deformity  Skin: no rash or lesions.  GU: no dysuria, change in color of urine, no urgency or frequency.  No flank pain, no hematuria   MS:  No joint pain or swelling.  No decreased range of motion.  No back pain.  Psych:  No change in mood or affect. No depression or anxiety.  No memory loss.   Vital Signs There were no vitals taken for this visit.   Physical Exam:  General- No distress,   A&Ox3 ENT: No sinus tenderness, TM clear, pale nasal mucosa, no oral exudate,no post nasal drip, no LAN Cardiac: S1, S2, regular rate and rhythm, no murmur Chest: No wheeze/ rales/ dullness; no accessory muscle use, no nasal flaring, no sternal retractions Abd.: Soft Non-tender Ext: No clubbing cyanosis, edema Neuro:  normal strength Skin: No rashes, warm and dry Psych: normal  mood and behavior   Assessment/Plan  No problem-specific Assessment & Plan notes found for this encounter.    Magdalen Spatz, NP 08/24/2018  11:00 AM

## 2018-08-24 NOTE — Assessment & Plan Note (Signed)
Continue to work towards healthy weight Monitor diet

## 2018-08-24 NOTE — Assessment & Plan Note (Signed)
Continue to decrease your smokeless tobacco use.  Currently at 1 can a day.  Goal to be down to half a can to 0 cans by next office visit.

## 2018-08-28 ENCOUNTER — Other Ambulatory Visit: Payer: Self-pay

## 2018-08-28 ENCOUNTER — Encounter (HOSPITAL_BASED_OUTPATIENT_CLINIC_OR_DEPARTMENT_OTHER): Payer: Self-pay | Admitting: Emergency Medicine

## 2018-08-28 ENCOUNTER — Emergency Department (HOSPITAL_BASED_OUTPATIENT_CLINIC_OR_DEPARTMENT_OTHER): Payer: BLUE CROSS/BLUE SHIELD

## 2018-08-28 ENCOUNTER — Emergency Department (HOSPITAL_BASED_OUTPATIENT_CLINIC_OR_DEPARTMENT_OTHER)
Admission: EM | Admit: 2018-08-28 | Discharge: 2018-08-28 | Disposition: A | Discharge status: Home / Self Care | Payer: BLUE CROSS/BLUE SHIELD | Attending: Emergency Medicine | Admitting: Emergency Medicine

## 2018-08-28 DIAGNOSIS — F17220 Nicotine dependence, chewing tobacco, uncomplicated: Secondary | ICD-10-CM | POA: Diagnosis not present

## 2018-08-28 DIAGNOSIS — M79672 Pain in left foot: Secondary | ICD-10-CM | POA: Diagnosis not present

## 2018-08-28 DIAGNOSIS — Z79899 Other long term (current) drug therapy: Secondary | ICD-10-CM | POA: Insufficient documentation

## 2018-08-28 DIAGNOSIS — M792 Neuralgia and neuritis, unspecified: Secondary | ICD-10-CM | POA: Insufficient documentation

## 2018-08-28 DIAGNOSIS — G629 Polyneuropathy, unspecified: Secondary | ICD-10-CM | POA: Diagnosis not present

## 2018-08-28 LAB — CBG MONITORING, ED: Glucose-Capillary: 73 mg/dL (ref 70–99)

## 2018-08-28 MED ORDER — COLCHICINE 0.6 MG PO TABS: ORAL_TABLET | ORAL | 0 refills | Status: DC

## 2018-08-28 MED ORDER — GABAPENTIN 100 MG PO CAPS: 100.0000 mg | ORAL_CAPSULE | Freq: Three times a day (TID) | ORAL | 0 refills | Status: DC

## 2018-08-28 NOTE — ED Triage Notes (Signed)
Burning pain on bottom of feet.  Sides of feet "throbbing" x4 days.  Sts he had this once before and it was gout.  Denies any other c/o.

## 2018-08-28 NOTE — ED Provider Notes (Signed)
[name] EMERGENCY DEPARTMENT Provider Note   CSN: [id number] Arrival date & time: 08/28/18  8099     History   Chief Complaint Chief Complaint  Patient presents with  . Foot Pain    HPI [name] is a 32 y.o. male.  HPI   Bottom of feet burning bilaterally, left side pain top and side feels like someone beating it with a hammer, worse when walking.  No trauma. Not on feet more than normal, no new shoes. Feels hot but no fever.  Started synthroid 3 days ago. Had tingling for about 4-5 days.  Hx of gout, but hasn't had problems in 10-37yrs.  Uncle has neuropathy, hx of DM for him.  Pt does not have hx of DM.  Seldom etoh.   Past Medical History:  Diagnosis Date  . ADHD (attention deficit hyperactivity disorder)   . Anal fissure   . Anxiety   . Bipolar disorder ([place])   . Chronic lower back pain   . Depression   . Esophagitis    Distal esophageal erosions consistent with mild erosive  reflux esophagitis 12/2008 by EGD   . Gastric ulcer   . GERD (gastroesophageal reflux disease)   . Hiatal hernia   . History of stomach ulcers   . Hx MRSA infection 8/08   right thigh  . Hx of colonic polyps   . Hypercholesteremia    denies  . Irritable bowel syndrome (IBS)   . Obesity   . Occult GI bleeding 12/2008   Trivial upper GI bleed/uncontrolled GERD by upper endoscopy 12/2008, normal f/u endoscopy 03/2009 with SBCE at that time  . OSA on CPAP   . Pain management   . Pneumonia 09/01/2015   "on ATB still" (09/04/2015)  . S/P colonoscopy 11/10, 10/08   Dr [name]  . Schizophrenia [facility])   . Sleep apnea   . Thyroid function test abnormal    Noted in 2011 discharge  . Tobacco dipper     Patient Active Problem List   Diagnosis Date Noted  . 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 Seasonal and perennial allergic rhinitis 07/23/2014  . Abdominal mass of other site 06/15/2012  . Abdominal cramps 06/15/2012  . Obstructive sleep apnea 05/03/2012  . Syncope  and collapse 05/03/2012  . Obesity, morbid ([place]) 05/03/2012  . Anal fissure 05/27/2011  . UNSPECIFIED ANEMIA 09/11/2009  . Blood in stool 04/06/2009  . ABDOMINAL PAIN OTHER SPECIFIED SITE 03/07/2009  . BIPOLAR DISORDER UNSPECIFIED 03/06/2009  . SMOKELESS TOBACCO ABUSE 03/06/2009  . ATTENTION DEFICIT HYPERACTIVITY DISORDER 03/06/2009  . GERD 03/06/2009  . DIARRHEA 03/06/2009    Past Surgical History:  Procedure Laterality Date  . ANKLE FRACTURE SURGERY Left ~ 2008  . BACK SURGERY    . COLONOSCOPY  10/10/2009   anal papilla otherwise normal  . ESOPHAGOGASTRODUODENOSCOPY   04/07/2009   Normal esophagus, small hiatal hernia  . ESOPHAGOGASTRODUODENOSCOPY  01/09/2009   Distal esophageal erosions consistent with mild erosive reflux esophagitis, otherwise normal esophagus, small hiatal herniaotherwise normal stomach, D1-D2   . ESOPHAGOGASTRODUODENOSCOPY  08/26/2007   Normal esophagus, a small hiatal/hernia, otherwise normal stomach D1 through D3  . FRACTURE SURGERY    . ileocolonoscopy  08/26/2007    A normal rectum, colon, and terminal ileum  . INCISION AND DRAINAGE  09/04/2015   "reopened my back incsion"  . LUMBAR LAMINECTOMY/DECOMPRESSION MICRODISCECTOMY Left 09/23/2017   Procedure: Microdiscectomy - left - Lumbar four-Lumbar five;  Surgeon: Earnie Larsson, MD;  Location: [facility]  OR;  Service: Neurosurgery;  Laterality: Left;  . LUMBAR MICRODISCECTOMY  08/21/2015  . LUMBAR MICRODISCECTOMY Left 09/23/2017   L4-5  . LUMBAR WOUND DEBRIDEMENT N/A 09/04/2015   Procedure: LUMBAR WOUND DEBRIDEMENT;  Surgeon: Consuella Lose, MD;  Location: [facility];  Service: Neurosurgery;  Laterality: N/A;  . right side sugery     gland removed  . Small bowel capsule  04/11/2009    normal throughout  . VASECTOMY          Home Medications    Prior to Admission medications   Medication Sig Start Date End Date Taking? Authorizing Provider  colchicine 0.6 MG tablet Take 2 tablets (1.2mg ) followed by  one tablet .6mg  one hour later. 08/28/18   Gareth Morgan, MD  cyclobenzaprine (FLEXERIL) 10 MG tablet Take 1 tablet (10 mg total) by mouth 3 (three) times daily as needed for muscle spasms. 07/27/17   Milton Ferguson, MD  dicyclomine (BENTYL) 10 MG capsule Take 1 capsule (10 mg total) by mouth 4 (four) times daily -  before meals and at bedtime. 07/16/18   Cirigliano, Vito V, DO  docusate sodium (COLACE) 100 MG capsule Take 100 mg by mouth daily as needed for mild constipation. Bedtime     [provider]  EPINEPHrine 0.3 mg/0.3 mL IJ SOAJ injection Inject 0.3 mg into the muscle as needed (allergic reactions).     [provider]  gabapentin (NEURONTIN) 100 MG capsule Take 1 capsule (100 mg total) by mouth 3 (three) times daily. 08/28/18   Gareth Morgan, MD  levothyroxine (SYNTHROID, LEVOTHROID) 75 MCG tablet Take 1 tablet (75 mcg total) by mouth daily. 08/21/18   [name], DO  pantoprazole (PROTONIX) 40 MG tablet TAKE 1 TABLET BY MOUTH TWICE DAILY 05/07/18   Claretta Fraise, MD    Family History Family History  Problem Relation Age of Onset  . Leukemia Father 7  . Colon polyps Father   . Clotting disorder Father   . Seizures Mother   . Irritable bowel syndrome Mother   . Bipolar disorder Brother   . ADD / ADHD Brother   . Diabetes Paternal Grandmother   . Ulcerative colitis Paternal Aunt   . Colon cancer Neg Hx   . Liver cancer Neg Hx   . Esophageal cancer Neg Hx     Social History Social History   Tobacco Use  . Smoking status: Never Smoker  . Smokeless tobacco: Current User    Types: Chew  Substance Use Topics  . Alcohol use: No    Alcohol/week: 0.0 standard drinks  . Drug use: No     Allergies   Ibuprofen; Influenza vaccines; Tylenol [acetaminophen]; Bee venom; and Tramadol   Review of Systems Review of Systems  Constitutional: Negative for fever.  HENT: Negative for sore throat.   Eyes: Negative for visual disturbance.  Respiratory:  Negative for shortness of breath.   Cardiovascular: Negative for chest pain.  Gastrointestinal: Negative for abdominal pain.  Endocrine: Positive for polydipsia.  Genitourinary: Negative for difficulty urinating.  Musculoskeletal: Positive for arthralgias. Negative for back pain and neck stiffness.  Skin: Negative for rash.  Neurological: Negative for syncope and headaches.     Physical Exam Updated Vital Signs BP 105/63 (BP Location: Right Arm)   Pulse 72   Temp 98 F (36.7 C) (Oral)   Resp 18   Ht 5\' 11"  (1.803 m)   Wt (!) 186.9 kg   SpO2 100%   BMI 57.46 kg/m   Physical  Exam  Constitutional: He is oriented to person, place, and time. He appears well-developed and well-nourished. No distress.  HENT:  Head: Normocephalic and atraumatic.  Eyes: Conjunctivae and EOM are normal.  Neck: Normal range of motion.  Cardiovascular: Normal rate, regular rhythm and intact distal pulses.  Pulmonary/Chest: Effort normal and breath sounds normal. No respiratory distress.  Musculoskeletal: He exhibits tenderness (tenderness and swelling lateral left MTP, no erythema). He exhibits no edema.  Neurological: He is alert and oriented to person, place, and time.  Skin: Skin is warm and dry. He is not diaphoretic.  Nursing note and vitals reviewed.    ED Treatments / Results  Labs (all labs ordered are listed, but only abnormal results are displayed) Labs Reviewed  CBG MONITORING, ED    EKG None  Radiology Dg Foot Complete Left  Result Date: 08/28/2018 CLINICAL DATA:  Acute left foot pain without known injury. EXAM: LEFT FOOT - COMPLETE 3+ VIEW COMPARISON:  Radiographs of March 01, 2014. FINDINGS: There is no evidence of fracture or dislocation. There is no evidence of arthropathy or other focal bone abnormality. Soft tissues are unremarkable. IMPRESSION: Negative. Electronically Signed   By: Marijo Conception, M.D.   On: 08/28/2018 08:38    Procedures Procedures (including critical  care time)  Medications Ordered in ED Medications - No data to display   Initial Impression / Assessment and Plan / ED Course  I have reviewed the triage vital signs and the nursing notes.  Pertinent labs & imaging results that were available during my care of the patient were reviewed by me and considered in my medical decision making (see chart for details).     32yo male presents with burning pain to bilateral soles of feet, pain to left dorsum. XR shows no acute fx or abnormalities. Pulses strong bilaterally, no sign of acute arterial thrombus. No leg swelling to suggest DVT.  No rash or findings to suggest cellulitis or septic arthritis. Pt with hx of gout which may contribute to lateral MTP pain and swelling.  Burning pain bilateral feet by hx most consistent with neuropathy. Glucose WNL. Will start gabapentin and recommend PCP follow up. Patient discharged in stable condition with understanding of reasons to return.   Final Clinical Impressions(s) / ED Diagnoses   Final diagnoses:  Neuropathic pain  Foot pain, left    ED Discharge Orders         Ordered    colchicine 0.6 MG tablet     08/28/18 1035    gabapentin (NEURONTIN) 100 MG capsule  3 times daily     08/28/18 1035           Gareth Morgan, MD 08/28/18 2127

## 2018-08-30 DIAGNOSIS — G4733 Obstructive sleep apnea (adult) (pediatric): Secondary | ICD-10-CM | POA: Diagnosis not present

## 2018-08-31 DIAGNOSIS — G4733 Obstructive sleep apnea (adult) (pediatric): Secondary | ICD-10-CM | POA: Diagnosis not present

## 2018-09-01 ENCOUNTER — Ambulatory Visit (INDEPENDENT_AMBULATORY_CARE_PROVIDER_SITE_OTHER): Payer: BLUE CROSS/BLUE SHIELD | Admitting: Family Medicine

## 2018-09-01 ENCOUNTER — Encounter: Payer: Self-pay | Admitting: Family Medicine

## 2018-09-01 VITALS — BP 117/77 | HR 118 | Temp 98.5°F | Ht 71.0 in | Wt >= 6400 oz

## 2018-09-01 DIAGNOSIS — R52 Pain, unspecified: Secondary | ICD-10-CM

## 2018-09-01 DIAGNOSIS — N50819 Testicular pain, unspecified: Secondary | ICD-10-CM

## 2018-09-01 MED ORDER — OSELTAMIVIR PHOSPHATE 75 MG PO CAPS: 75.0000 mg | ORAL_CAPSULE | Freq: Two times a day (BID) | ORAL | 0 refills | Status: DC

## 2018-09-01 MED ORDER — CIPROFLOXACIN HCL 500 MG PO TABS: 500.0000 mg | ORAL_TABLET | Freq: Two times a day (BID) | ORAL | 0 refills | Status: DC

## 2018-09-01 NOTE — Progress Notes (Signed)
Chief Complaint  Patient presents with  . Headache  . Generalized Body Aches    HPI  Patient presents today for onset yesterday with a headache feeling like he is being hit with a jackhammer.  He felt dizzy and nauseated with chills and body aches.  Symptoms continued today somewhat worse.  He has having some testicular pain and frequency of urination as well.  PMH: Smoking status noted ROS: Per HPI  Objective: BP 117/77   Pulse (!) 118   Temp 98.5 F (36.9 C) (Oral)   Ht 5\' 11"  (1.803 m)   Wt (!) 405 lb (183.7 kg)   BMI 56.49 kg/m  Gen: NAD, alert, cooperative with exam HEENT: NCAT, EOMI, PERRL CV: RRR, good S1/S2, no murmur Resp: CTABL, no wheezes, non-labored Abd: SNTND, BS present, no guarding or organomegaly Ext: No edema, warm Neuro: Alert and oriented, No gross deficits Urinalysis shows no sign of infection.  Influenza a and B testing is negative. Assessment and plan:  1. Body aches   2. Testicle pain     Meds ordered this encounter  Medications  . oseltamivir (TAMIFLU) 75 MG capsule    Sig: Take 1 capsule (75 mg total) by mouth 2 (two) times daily.    Dispense:  10 capsule    Refill:  0  . ciprofloxacin (CIPRO) 500 MG tablet    Sig: Take 1 tablet (500 mg total) by mouth 2 (two) times daily.    Dispense:  20 tablet    Refill:  0    Orders Placed This Encounter  Procedures  . Urine Culture  . Veritor Flu A/B Waived    Order Specific Question:   Source    Answer:   nasal  . Urinalysis    Follow up as needed.  Claretta Fraise, MD

## 2018-09-02 LAB — URINALYSIS
BILIRUBIN UA: NEGATIVE
Glucose, UA: NEGATIVE
Ketones, UA: NEGATIVE
LEUKOCYTES UA: NEGATIVE
NITRITE UA: NEGATIVE
PH UA: 5.5 (ref 5.0–7.5)
Protein, UA: NEGATIVE
SPEC GRAV UA: 1.015 (ref 1.005–1.030)
Urobilinogen, Ur: 0.2 mg/dL (ref 0.2–1.0)

## 2018-09-02 LAB — URINE CULTURE: Organism ID, Bacteria: NO GROWTH

## 2018-09-02 LAB — VERITOR FLU A/B WAIVED
INFLUENZA B: NEGATIVE
Influenza A: NEGATIVE

## 2018-09-06 ENCOUNTER — Other Ambulatory Visit: Payer: Self-pay | Admitting: Family Medicine

## 2018-09-06 DIAGNOSIS — K219 Gastro-esophageal reflux disease without esophagitis: Secondary | ICD-10-CM

## 2018-09-08 ENCOUNTER — Encounter: Payer: Self-pay | Admitting: Family Medicine

## 2018-09-08 ENCOUNTER — Ambulatory Visit (INDEPENDENT_AMBULATORY_CARE_PROVIDER_SITE_OTHER): Payer: BLUE CROSS/BLUE SHIELD | Admitting: Family Medicine

## 2018-09-08 VITALS — BP 122/84 | HR 91 | Temp 99.0°F | Wt >= 6400 oz

## 2018-09-08 DIAGNOSIS — G629 Polyneuropathy, unspecified: Secondary | ICD-10-CM | POA: Diagnosis not present

## 2018-09-08 MED ORDER — GABAPENTIN 300 MG PO CAPS: 300.0000 mg | ORAL_CAPSULE | Freq: Three times a day (TID) | ORAL | 3 refills | Status: DC

## 2018-09-08 NOTE — Patient Instructions (Signed)
I prescribed you gabapentin 300 mg.  Start with 300 mg every evening.  If you are tolerating the medication at this dose after 3 days but still have pain in your feet, you can increase to 300 in the morning and 300 in the evening.  If after 3 more days, you are still having pain, you can increase to 300 in the morning at 300 with lunch and 300 in the evening.  See me back as scheduled for your thyroid.  If your urine symptoms persist after finishing the antibiotic, contact me and we can place a referral to the urologist.  Neuropathic Pain Neuropathic pain is pain caused by damage to the nerves that are responsible for certain sensations in your body (sensory nerves). The pain can be caused by damage to:  The sensory nerves that send signals to your spinal cord and brain (peripheral nervous system).  The sensory nerves in your brain or spinal cord (central nervous system).  Neuropathic pain can make you more sensitive to pain. What would be a minor sensation for most people may feel very painful if you have neuropathic pain. This is usually a long-term condition that can be difficult to treat. The type of pain can differ from person to person. It may start suddenly (acute), or it may develop slowly and last for a long time (chronic). Neuropathic pain may come and go as damaged nerves heal or may stay at the same level for years. It often causes emotional distress, loss of sleep, and a lower quality of life. What are the causes? The most common cause of damage to a sensory nerve is diabetes. Many other diseases and conditions can also cause neuropathic pain. Causes of neuropathic pain can be classified as:  Toxic. Many drugs and chemicals can cause toxic damage. The most common cause of toxic neuropathic pain is damage from drug treatment for cancer (chemotherapy).  Metabolic. This type of pain can happen when a disease causes imbalances that damage nerves. Diabetes is the most common of these  diseases. Vitamin B deficiency caused by long-term alcohol abuse is another common cause.  Traumatic. Any injury that cuts, crushes, or stretches a nerve can cause damage and pain. A common example is feeling pain after losing an arm or leg (phantom limb pain).  Compression-related. If a sensory nerve gets trapped or compressed for a long period of time, the blood supply to the nerve can be cut off.  Vascular. Many blood vessel diseases can cause neuropathic pain by decreasing blood supply and oxygen to nerves.  Autoimmune. This type of pain results from diseases in which the body's defense system mistakenly attacks sensory nerves. Examples of autoimmune diseases that can cause neuropathic pain include lupus and multiple sclerosis.  Infectious. Many types of viral infections can damage sensory nerves and cause pain. Shingles infection is a common cause of this type of pain.  Inherited. Neuropathic pain can be a symptom of many diseases that are passed down through families (genetic).  What are the signs or symptoms? The main symptom is pain. Neuropathic pain is often described as:  Burning.  Shock-like.  Stinging.  Hot or cold.  Itching.  How is this diagnosed? No single test can diagnose neuropathic pain. Your health care provider will do a physical exam and ask you about your pain. You may use a pain scale to describe how bad your pain is. You may also have tests to see if you have a high sensitivity to pain and to  help find the cause and location of any sensory nerve damage. These tests may include:  Imaging studies, such as: ? X-rays. ? CT scan. ? MRI.  Nerve conduction studies to test how well nerve signals travel through your sensory nerves (electrodiagnostic testing).  Stimulating your sensory nerves through electrodes on your skin and measuring the response in your spinal cord and brain (somatosensory evoked potentials).  How is this treated? Treatment for neuropathic  pain may change over time. You may need to try different treatment options or a combination of treatments. Some options include:  Over-the-counter pain relievers.  Prescription medicines. Some medicines used to treat other conditions may also help neuropathic pain. These include medicines to: ? Control seizures (anticonvulsants). ? Relieve depression (antidepressants).  Prescription-strength pain relievers (narcotics). These are usually used when other pain relievers do not help.  Transcutaneous nerve stimulation (TENS). This uses electrical currents to block painful nerve signals. The treatment is painless.  Topical and local anesthetics. These are medicines that numb the nerves. They can be injected as a nerve block or applied to the skin.  Alternative treatments, such as: ? Acupuncture. ? Meditation. ? Massage. ? Physical therapy. ? Pain management programs. ? Counseling.  Follow these instructions at home:  Learn as much as you can about your condition.  Take medicines only as directed by your health care provider.  Work closely with all your health care providers to find what works best for you.  Have a good support system at home.  Consider joining a chronic pain support group. Contact a health care provider if:  Your pain treatments are not helping.  You are having side effects from your medicines.  You are struggling with fatigue, mood changes, depression, or anxiety. This information is not intended to replace advice given to you by your health care provider. Make sure you discuss any questions you have with your health care provider. Document Released: 08/08/2004 Document Revised: 05/31/2016 Document Reviewed: 04/21/2014 Elsevier Interactive Patient Education  [employer].

## 2018-09-08 NOTE — Progress Notes (Signed)
Subjective: CC: Foot pain PCP: [name], DO MOQ:[id number] [name] is a 32 y.o. male presenting to clinic today for:  1. Foot pain Patient reports he has known neuropathy and was previously on Neurontin.  He was seen in the emergency department recently and was restarted on Neurontin 100 mg 3 times daily.  He has been taking this and has noticed some improvement in the neuropathic pain in his feet.  He states that he just finished the medication.  He would like to titrate this higher, as he still has some residual pain but again still improved from onset.  No history of diabetes, alcohol use disorder.  He reports neuropathy runs in his family and has had problems with his back in the past as well.  Denies any excessive sedation from the Neurontin.  No problems with breathing or falls.   ROS: Per HPI  Allergies  Allergen Reactions  . Ibuprofen Other (See Comments)    Makes ulcers bleed  . Influenza Vaccines Shortness Of Breath and Other (See Comments)    Rash and unable to breathe well  . Tylenol [Acetaminophen] Other (See Comments)    Bleeding ulcers  . Bee Venom Swelling    SWELLING REACTION UNSPECIFIED   . Tramadol Itching   Past Medical History:  Diagnosis Date  . ADHD (attention deficit hyperactivity disorder)   . Anal fissure   . Anxiety   . Bipolar disorder ([place])   . Chronic lower back pain   . Depression   . Esophagitis    Distal esophageal erosions consistent with mild erosive  reflux esophagitis 12/2008 by EGD   . Gastric ulcer   . GERD (gastroesophageal reflux disease)   . Hiatal hernia   . History of stomach ulcers   . Hx MRSA infection 8/08   right thigh  . Hx of colonic polyps   . Hypercholesteremia    denies  . Irritable bowel syndrome (IBS)   . Obesity   . Occult GI bleeding 12/2008   Trivial upper GI bleed/uncontrolled GERD by upper endoscopy 12/2008, normal f/u endoscopy 03/2009 with SBCE at that time  . OSA on CPAP   . Pain management   .  Pneumonia 09/01/2015   "on ATB still" (09/04/2015)  . S/P colonoscopy 11/10, 10/08   Dr [name]  . Schizophrenia [facility])   . Sleep apnea   . Thyroid function test abnormal    Noted in 2011 discharge  . Tobacco dipper     Current Outpatient Medications:  .  ciprofloxacin (CIPRO) 500 MG tablet, Take 1 tablet (500 mg total) by mouth 2 (two) times daily., Disp: 20 tablet, Rfl: 0 .  colchicine 0.6 MG tablet, Take 2 tablets (1.2mg ) followed by one tablet .6mg  one hour later. (Patient not taking: Reported on 09/01/2018), Disp: 3 tablet, Rfl: 0 .  cyclobenzaprine (FLEXERIL) 10 MG tablet, Take 1 tablet (10 mg total) by mouth 3 (three) times daily as needed for muscle spasms., Disp: 20 tablet, Rfl: 0 .  dicyclomine (BENTYL) 10 MG capsule, Take 1 capsule (10 mg total) by mouth 4 (four) times daily -  before meals and at bedtime., Disp: 120 capsule, Rfl: 11 .  docusate sodium (COLACE) 100 MG capsule, Take 100 mg by mouth daily as needed for mild constipation. Bedtime , Disp: , Rfl:  .  EPINEPHrine 0.3 mg/0.3 mL IJ SOAJ injection, Inject 0.3 mg into the muscle as needed (allergic reactions). , Disp: , Rfl:  .  gabapentin (NEURONTIN) 100 MG  capsule, Take 1 capsule (100 mg total) by mouth 3 (three) times daily., Disp: 30 capsule, Rfl: 0 .  levothyroxine (SYNTHROID, LEVOTHROID) 75 MCG tablet, Take 1 tablet (75 mcg total) by mouth daily., Disp: 30 tablet, Rfl: 1 .  oseltamivir (TAMIFLU) 75 MG capsule, Take 1 capsule (75 mg total) by mouth 2 (two) times daily., Disp: 10 capsule, Rfl: 0 .  pantoprazole (PROTONIX) 40 MG tablet, TAKE 1 TABLET BY MOUTH TWICE DAILY, Disp: 180 tablet, Rfl: 1 Social History   Socioeconomic History  . Marital status: Married    Spouse name: Not on file  . Number of children: 2  . Years of education: Not on file  . Highest education level: Not on file  Occupational History  . Occupation: Chief Financial Officer: [employer]  . Occupation: Hydrologist  Social  Needs  . Financial resource strain: Not on file  . Food insecurity:    Worry: Not on file    Inability: Not on file  . Transportation needs:    Medical: Not on file    Non-medical: Not on file  Tobacco Use  . Smoking status: Never Smoker  . Smokeless tobacco: Current User    Types: Chew  Substance and Sexual Activity  . Alcohol use: No    Alcohol/week: 0.0 standard drinks  . Drug use: No  . Sexual activity: Not Currently  Lifestyle  . Physical activity:    Days per week: Not on file    Minutes per session: Not on file  . Stress: Not on file  Relationships  . Social connections:    Talks on phone: Not on file    Gets together: Not on file    Attends religious service: Not on file    Active member of club or organization: Not on file    Attends meetings of clubs or organizations: Not on file    Relationship status: Not on file  . Intimate partner violence:    Fear of current or ex partner: Not on file    Emotionally abused: Not on file    Physically abused: Not on file    Forced sexual activity: Not on file  Other Topics Concern  . Not on file  Social History Narrative  . Not on file   Family History  Problem Relation Age of Onset  . Leukemia Father 92  . Colon polyps Father   . Clotting disorder Father   . Seizures Mother   . Irritable bowel syndrome Mother   . Bipolar disorder Brother   . ADD / ADHD Brother   . Diabetes Paternal Grandmother   . Ulcerative colitis Paternal Aunt   . Colon cancer Neg Hx   . Liver cancer Neg Hx   . Esophageal cancer Neg Hx     Objective: Office vital signs reviewed. BP 122/84   Pulse 91   Temp 99 F (37.2 C) (Oral)   Wt (!) 410 lb (186 kg)   BMI 57.18 kg/m   Physical Examination:  General: Awake, alert, obese, No acute distress HEENT: Normal, sclera white, MMM Cardio: regular rate and rhythm, S1S2 heard, no murmurs appreciated Pulm: clear to auscultation bilaterally, no wheezes, rhonchi or rales; normal work of  breathing on room air MSK: Wide gait but ambulates independently.  Assessment/ Plan: 32 y.o. male   1. Neuropathy Increase Neurontin to 300 mg.  Start with 300 mg nightly.  After 3 days may increase to 300 twice daily.  If still having  problems 3 days following that with breakthrough neuropathy can increase to 300 mg 3 times daily.  I discussed hydration with the patient.  He will follow-up in a couple of weeks for recheck of his thyroid at which time we will recheck his neuropathy as well.  We discussed side effect of sedation.  Patient to take this medication with extreme caution.  Follow-up as scheduled.    Meds ordered this encounter  Medications  . gabapentin (NEURONTIN) 300 MG capsule    Sig: Take 1 capsule (300 mg total) by mouth 3 (three) times daily.    Dispense:  90 capsule    Refill:  [place], [place] [phone]

## 2018-09-09 ENCOUNTER — Encounter: Payer: Self-pay | Admitting: Family Medicine

## 2018-09-09 DIAGNOSIS — F3181 Bipolar II disorder: Secondary | ICD-10-CM | POA: Diagnosis not present

## 2018-09-14 DIAGNOSIS — G4733 Obstructive sleep apnea (adult) (pediatric): Secondary | ICD-10-CM | POA: Diagnosis not present

## 2018-09-15 DIAGNOSIS — F3181 Bipolar II disorder: Secondary | ICD-10-CM | POA: Diagnosis not present

## 2018-09-24 DIAGNOSIS — F3181 Bipolar II disorder: Secondary | ICD-10-CM | POA: Diagnosis not present

## 2018-09-30 DIAGNOSIS — G4733 Obstructive sleep apnea (adult) (pediatric): Secondary | ICD-10-CM | POA: Diagnosis not present

## 2018-10-14 DIAGNOSIS — F3181 Bipolar II disorder: Secondary | ICD-10-CM | POA: Diagnosis not present

## 2018-10-17 ENCOUNTER — Other Ambulatory Visit: Payer: Self-pay | Admitting: Family Medicine

## 2018-10-30 DIAGNOSIS — G4733 Obstructive sleep apnea (adult) (pediatric): Secondary | ICD-10-CM | POA: Diagnosis not present

## 2018-11-23 DIAGNOSIS — Z6841 Body Mass Index (BMI) 40.0 and over, adult: Secondary | ICD-10-CM | POA: Diagnosis not present

## 2018-11-23 DIAGNOSIS — L249 Irritant contact dermatitis, unspecified cause: Secondary | ICD-10-CM | POA: Diagnosis not present

## 2018-11-27 DIAGNOSIS — R05 Cough: Secondary | ICD-10-CM | POA: Diagnosis not present

## 2018-11-27 DIAGNOSIS — J209 Acute bronchitis, unspecified: Secondary | ICD-10-CM | POA: Diagnosis not present

## 2018-11-27 DIAGNOSIS — R110 Nausea: Secondary | ICD-10-CM | POA: Diagnosis not present

## 2018-11-27 DIAGNOSIS — Z6841 Body Mass Index (BMI) 40.0 and over, adult: Secondary | ICD-10-CM | POA: Diagnosis not present

## 2018-12-02 DIAGNOSIS — G4733 Obstructive sleep apnea (adult) (pediatric): Secondary | ICD-10-CM | POA: Diagnosis not present

## 2018-12-08 ENCOUNTER — Ambulatory Visit (INDEPENDENT_AMBULATORY_CARE_PROVIDER_SITE_OTHER): Payer: BLUE CROSS/BLUE SHIELD | Admitting: Family Medicine

## 2018-12-08 ENCOUNTER — Ambulatory Visit (INDEPENDENT_AMBULATORY_CARE_PROVIDER_SITE_OTHER): Payer: BLUE CROSS/BLUE SHIELD

## 2018-12-08 VITALS — BP 130/74 | HR 90 | Temp 96.8°F | Ht 71.0 in | Wt >= 6400 oz

## 2018-12-08 DIAGNOSIS — R0602 Shortness of breath: Secondary | ICD-10-CM

## 2018-12-08 DIAGNOSIS — R058 Other specified cough: Secondary | ICD-10-CM

## 2018-12-08 DIAGNOSIS — R05 Cough: Secondary | ICD-10-CM | POA: Diagnosis not present

## 2018-12-08 DIAGNOSIS — E039 Hypothyroidism, unspecified: Secondary | ICD-10-CM | POA: Diagnosis not present

## 2018-12-08 MED ORDER — GUAIFENESIN-CODEINE 100-10 MG/5ML PO SOLN: 5.0000 mL | Freq: Four times a day (QID) | ORAL | 0 refills | Status: DC | PRN

## 2018-12-08 MED ORDER — CEFDINIR 300 MG PO CAPS: 300.0000 mg | ORAL_CAPSULE | Freq: Two times a day (BID) | ORAL | 0 refills | Status: DC

## 2018-12-08 NOTE — Patient Instructions (Signed)
You had labs performed today.  You will be contacted with the results of the labs once they are available, usually in the next 3 business days for routine lab work.     Acute Bronchitis, Adult Acute bronchitis is when air tubes (bronchi) in the lungs suddenly get swollen. The condition can make it hard to breathe. It can also cause these symptoms:  A cough.  Coughing up clear, yellow, or green mucus.  Wheezing.  Chest congestion.  Shortness of breath.  A fever.  Body aches.  Chills.  A sore throat. Follow these instructions at home:  Medicines  Take over-the-counter and prescription medicines only as told by your doctor.  If you were prescribed an antibiotic medicine, take it as told by your doctor. Do not stop taking the antibiotic even if you start to feel better. General instructions  Rest.  Drink enough fluids to keep your pee (urine) pale yellow.  Avoid smoking and secondhand smoke. If you smoke and you need help quitting, ask your doctor. Quitting will help your lungs heal faster.  Use an inhaler, cool mist vaporizer, or humidifier as told by your doctor.  Keep all follow-up visits as told by your doctor. This is important. How is this prevented? To lower your risk of getting this condition again:  Wash your hands often with soap and water. If you cannot use soap and water, use hand sanitizer.  Avoid contact with people who have cold symptoms.  Try not to touch your hands to your mouth, nose, or eyes.  Make sure to get the flu shot every year. Contact a doctor if:  Your symptoms do not get better in 2 weeks. Get help right away if:  You cough up blood.  You have chest pain.  You have very bad shortness of breath.  You become dehydrated.  You faint (pass out) or keep feeling like you are going to pass out.  You keep throwing up (vomiting).  You have a very bad headache.  Your fever or chills gets worse. This information is not intended to  replace advice given to you by your health care provider. Make sure you discuss any questions you have with your health care provider. Document Released: 04/29/2008 Document Revised: 06/25/2017 Document Reviewed: 05/01/2016 Elsevier Interactive Patient Education  2019 [employer].

## 2018-12-08 NOTE — Progress Notes (Signed)
Subjective: CC: Cough PCP: [name], DO JOA:CZYSAYTK [name] is a 33 y.o. male presenting to clinic today for:  1. Cough Patient reports a greater than 1 week history of productive cough with green sputum, associated shortness of breath and headache.  He had a fever to 101 F yesterday.  He does report that he is status post treatment with a Z-Pak that he received at urgent care with last dose on Thursday.  He was also prescribed Tessalon Perles which have helped some with cough but have not resolved nor substantially improved symptoms.  No hemoptysis, wheeze.  2.  Hypothyroidism Patient continues to have low energy and difficulty with weight loss.  He reports compliance with Synthroid daily.  ROS: Per HPI  Allergies  Allergen Reactions  . Ibuprofen Other (See Comments)    Makes ulcers bleed  . Influenza Vaccines Shortness Of Breath and Other (See Comments)    Rash and unable to breathe well  . Tylenol [Acetaminophen] Other (See Comments)    Bleeding ulcers  . Bee Venom Swelling    SWELLING REACTION UNSPECIFIED   . Tramadol Itching   Past Medical History:  Diagnosis Date  . ADHD (attention deficit hyperactivity disorder)   . Anal fissure   . Anxiety   . Bipolar disorder ([place])   . Chronic lower back pain   . Depression   . Esophagitis    Distal esophageal erosions consistent with mild erosive  reflux esophagitis 12/2008 by EGD   . Gastric ulcer   . GERD (gastroesophageal reflux disease)   . Hiatal hernia   . History of stomach ulcers   . Hx MRSA infection 8/08   right thigh  . Hx of colonic polyps   . Hypercholesteremia    denies  . Irritable bowel syndrome (IBS)   . Obesity   . Occult GI bleeding 12/2008   Trivial upper GI bleed/uncontrolled GERD by upper endoscopy 12/2008, normal f/u endoscopy 03/2009 with SBCE at that time  . OSA on CPAP   . Pain management   . Pneumonia 09/01/2015   "on ATB still" (09/04/2015)  . S/P colonoscopy 11/10, 10/08   Dr  [name]  . Schizophrenia [facility])   . Sleep apnea   . Thyroid function test abnormal    Noted in 2011 discharge  . Tobacco dipper     Current Outpatient Medications:  .  ciprofloxacin (CIPRO) 500 MG tablet, Take 1 tablet (500 mg total) by mouth 2 (two) times daily., Disp: 20 tablet, Rfl: 0 .  cyclobenzaprine (FLEXERIL) 10 MG tablet, Take 1 tablet (10 mg total) by mouth 3 (three) times daily as needed for muscle spasms., Disp: 20 tablet, Rfl: 0 .  dicyclomine (BENTYL) 10 MG capsule, Take 1 capsule (10 mg total) by mouth 4 (four) times daily -  before meals and at bedtime., Disp: 120 capsule, Rfl: 11 .  docusate sodium (COLACE) 100 MG capsule, Take 100 mg by mouth daily as needed for mild constipation. Bedtime , Disp: , Rfl:  .  EPINEPHrine 0.3 mg/0.3 mL IJ SOAJ injection, Inject 0.3 mg into the muscle as needed (allergic reactions). , Disp: , Rfl:  .  gabapentin (NEURONTIN) 300 MG capsule, Take 1 capsule (300 mg total) by mouth 3 (three) times daily., Disp: 90 capsule, Rfl: 3 .  levothyroxine (SYNTHROID, LEVOTHROID) 75 MCG tablet, TAKE 1 TABLET BY MOUTH ONCE DAILY, Disp: 90 tablet, Rfl: 0 .  pantoprazole (PROTONIX) 40 MG tablet, TAKE 1 TABLET BY MOUTH TWICE DAILY, Disp:  180 tablet, Rfl: 1 Social History   Socioeconomic History  . Marital status: Married    Spouse name: Not on file  . Number of children: 2  . Years of education: Not on file  . Highest education level: Not on file  Occupational History  . Occupation: Chief Financial Officer: [employer]  . Occupation: Hydrologist  Social Needs  . Financial resource strain: Not on file  . Food insecurity:    Worry: Not on file    Inability: Not on file  . Transportation needs:    Medical: Not on file    Non-medical: Not on file  Tobacco Use  . Smoking status: Never Smoker  . Smokeless tobacco: Current User    Types: Chew  Substance and Sexual Activity  . Alcohol use: No    Alcohol/week: 0.0 standard drinks  .  Drug use: No  . Sexual activity: Not Currently  Lifestyle  . Physical activity:    Days per week: Not on file    Minutes per session: Not on file  . Stress: Not on file  Relationships  . Social connections:    Talks on phone: Not on file    Gets together: Not on file    Attends religious service: Not on file    Active member of club or organization: Not on file    Attends meetings of clubs or organizations: Not on file    Relationship status: Not on file  . Intimate partner violence:    Fear of current or ex partner: Not on file    Emotionally abused: Not on file    Physically abused: Not on file    Forced sexual activity: Not on file  Other Topics Concern  . Not on file  Social History Narrative  . Not on file   Family History  Problem Relation Age of Onset  . Leukemia Father 32  . Colon polyps Father   . Clotting disorder Father   . Seizures Mother   . Irritable bowel syndrome Mother   . Bipolar disorder Brother   . ADD / ADHD Brother   . Diabetes Paternal Grandmother   . Ulcerative colitis Paternal Aunt   . Colon cancer Neg Hx   . Liver cancer Neg Hx   . Esophageal cancer Neg Hx     Objective: Office vital signs reviewed. BP 130/74   Pulse 90   Temp (!) 96.8 F (36 C) (Oral)   Ht 5\' 11"  (1.803 m)   Wt (!) 412 lb (186.9 kg)   SpO2 98%   BMI 57.46 kg/m   Physical Examination:  General: Awake, alert, morbidly obese, nontoxic, No acute distress HEENT: Normal    Neck: No masses palpated. No lymphadenopathy; no goiter    Nose: nasal turbinates moist, no nasal discharge    Throat: moist mucus membranes, no erythema, no tonsillar exudate.  Airway is patent Cardio: regular rate and rhythm, S1S2 heard, no murmurs appreciated Pulm: clear to auscultation bilaterally, no wheezes, rhonchi or rales; normal work of breathing on room air  Dg Chest 2 View  Result Date: 12/08/2018 CLINICAL DATA:  Cough EXAM: CHEST - 2 VIEW COMPARISON:  07/03/2018 FINDINGS: Heart and  mediastinal contours are within normal limits. No focal opacities or effusions. No acute bony abnormality. IMPRESSION: No active cardiopulmonary disease. Electronically Signed   By: Rolm Baptise M.D.   On: 12/08/2018 09:36    Assessment/ Plan: 33 y.o. male   1. Productive cough Patient  is afebrile nontoxic-appearing with normal vital signs including oxygen saturation on room air.  He is status post completion of Z-Pak.  I reviewed his urgent care note and plan.  Because of persistent cough and fever yesterday, chest x-ray was obtained to further evaluate.  Personal review of chest x-ray did not demonstrate any acute pulmonary infiltrates or abnormalities.  Radiology review confirmed this.  Given persistent/worsening nature of symptoms I have ordered empiric Omnicef to take twice daily for the next 10 days.  I have also given him a cough syrup to use at nighttime if needed.  We discussed allowing 8 hours of sleep with this medication.  No driving or operating heavy machinery while taking since it contains codeine.  We discussed reasons for return and emergent evaluation emergency department.  Patient was good understanding of follow-up PRN. - DG Chest 2 View; Future - CBC with Differential  2. Shortness of breath - DG Chest 2 View; Future - CBC with Differential  3. Acquired hypothyroidism Continues to have low energy. - Thyroid Panel With TSH  I reviewed his urgent care note from 11/27/2018, where he was checked for influenza and this was negative. The Narcotic Database has been reviewed.  There were no red flags.     Orders Placed This Encounter  Procedures  . DG Chest 2 View    Standing Status:   Future    Standing Expiration Date:   02/06/2020    Order Specific Question:   Reason for Exam (SYMPTOM  OR DIAGNOSIS REQUIRED)    Answer:   cough    Order Specific Question:   Preferred imaging location?    Answer:   Internal    Order Specific Question:   Radiology Contrast Protocol - do NOT  remove file path    Answer:   [email or web address]   Meds ordered this encounter  Medications  . cefdinir (OMNICEF) 300 MG capsule    Sig: Take 1 capsule (300 mg total) by mouth 2 (two) times daily. 1 po BID    Dispense:  20 capsule    Refill:  0  . guaiFENesin-codeine 100-10 MG/5ML syrup    Sig: Take 5 mLs by mouth every 6 (six) hours as needed for cough.    Dispense:  100 mL    Refill:  [place], DO [place] [phone]

## 2018-12-09 LAB — CBC WITH DIFFERENTIAL/PLATELET
BASOS ABS: 0.1 10*3/uL (ref 0.0–0.2)
Basos: 1 %
EOS (ABSOLUTE): 0.1 10*3/uL (ref 0.0–0.4)
Eos: 2 %
Hematocrit: 43.6 % (ref 37.5–51.0)
Hemoglobin: 14.5 g/dL (ref 13.0–17.7)
IMMATURE GRANULOCYTES: 0 %
Immature Grans (Abs): 0 10*3/uL (ref 0.0–0.1)
LYMPHS ABS: 2.8 10*3/uL (ref 0.7–3.1)
Lymphs: 38 %
MCH: 27.6 pg (ref 26.6–33.0)
MCHC: 33.3 g/dL (ref 31.5–35.7)
MCV: 83 fL (ref 79–97)
MONOS ABS: 0.5 10*3/uL (ref 0.1–0.9)
Monocytes: 6 %
Neutrophils Absolute: 3.9 10*3/uL (ref 1.4–7.0)
Neutrophils: 53 %
PLATELETS: 285 10*3/uL (ref 150–450)
RBC: 5.26 x10E6/uL (ref 4.14–5.80)
RDW: 14.6 % (ref 11.6–15.4)
WBC: 7.4 10*3/uL (ref 3.4–10.8)

## 2018-12-09 LAB — THYROID PANEL WITH TSH
Free Thyroxine Index: 1.6 (ref 1.2–4.9)
T3 UPTAKE RATIO: 27 % (ref 24–39)
T4 TOTAL: 6 ug/dL (ref 4.5–12.0)
TSH: 2.19 u[IU]/mL (ref 0.450–4.500)

## 2018-12-31 DIAGNOSIS — G4733 Obstructive sleep apnea (adult) (pediatric): Secondary | ICD-10-CM | POA: Diagnosis not present

## 2019-01-09 ENCOUNTER — Encounter (HOSPITAL_COMMUNITY): Payer: Self-pay | Admitting: Emergency Medicine

## 2019-01-09 ENCOUNTER — Emergency Department (HOSPITAL_COMMUNITY)
Admission: EM | Admit: 2019-01-09 | Discharge: 2019-01-09 | Disposition: A | Discharge status: Home / Self Care | Payer: BLUE CROSS/BLUE SHIELD | Attending: Emergency Medicine | Admitting: Emergency Medicine

## 2019-01-09 ENCOUNTER — Other Ambulatory Visit: Payer: Self-pay | Admitting: Family Medicine

## 2019-01-09 DIAGNOSIS — M7918 Myalgia, other site: Secondary | ICD-10-CM | POA: Diagnosis not present

## 2019-01-09 DIAGNOSIS — F909 Attention-deficit hyperactivity disorder, unspecified type: Secondary | ICD-10-CM | POA: Diagnosis not present

## 2019-01-09 DIAGNOSIS — J111 Influenza due to unidentified influenza virus with other respiratory manifestations: Secondary | ICD-10-CM | POA: Diagnosis not present

## 2019-01-09 DIAGNOSIS — F319 Bipolar disorder, unspecified: Secondary | ICD-10-CM | POA: Insufficient documentation

## 2019-01-09 DIAGNOSIS — Z79899 Other long term (current) drug therapy: Secondary | ICD-10-CM | POA: Insufficient documentation

## 2019-01-09 DIAGNOSIS — F209 Schizophrenia, unspecified: Secondary | ICD-10-CM | POA: Insufficient documentation

## 2019-01-09 DIAGNOSIS — F419 Anxiety disorder, unspecified: Secondary | ICD-10-CM | POA: Insufficient documentation

## 2019-01-09 DIAGNOSIS — R69 Illness, unspecified: Secondary | ICD-10-CM

## 2019-01-09 MED ORDER — PROMETHAZINE-DM 6.25-15 MG/5ML PO SYRP: 5.0000 mL | ORAL_SOLUTION | Freq: Four times a day (QID) | ORAL | 0 refills | Status: DC | PRN

## 2019-01-09 MED ORDER — OSELTAMIVIR PHOSPHATE 75 MG PO CAPS: 75.0000 mg | ORAL_CAPSULE | Freq: Two times a day (BID) | ORAL | 0 refills | Status: DC

## 2019-01-09 NOTE — ED Triage Notes (Signed)
Pt states his wife and daughter were dx with flu yesterday and this morning he woke up with body aches, headache, and sore throat.  No meds taken pta.

## 2019-01-09 NOTE — ED Provider Notes (Signed)
[facility] EMERGENCY DEPARTMENT Provider Note   CSN: [id number] Arrival date & time: 01/09/19  1152     History   Chief Complaint Chief Complaint  Patient presents with  . Generalized Body Aches    HPI [name] is a 33 y.o. male.  HPI   [name] is a 33 y.o. male who presents to the Emergency Department complaining of generalized body aches, cough, sore throat and frontal headache.  He states his symptoms began this morning upon waking.  He states that his daughter was diagnosed with influenza yesterday.  She is currently taking Tamiflu.  He describes his cough as mostly nonproductive.  He does describe some upper chest tightness associated with excessive coughing.  He is not taking any medications for symptomatic relief stating that he cannot take Tylenol or ibuprofen due to risk of GI bleeding and he has an allergy to Tylenol.  He denies neck pain or stiffness, visual changes, chest pain or shortness of breath.  No abdominal pain, vomiting, or diarrhea.    Past Medical History:  Diagnosis Date  . ADHD (attention deficit hyperactivity disorder)   . Anal fissure   . Anxiety   . Bipolar disorder ([facility])   . Chronic lower back pain   . Depression   . Esophagitis    Distal esophageal erosions consistent with mild erosive  reflux esophagitis 12/2008 by EGD   . Gastric ulcer   . GERD (gastroesophageal reflux disease)   . Hiatal hernia   . History of stomach ulcers   . Hx MRSA infection 8/08   right thigh  . Hx of colonic polyps   . Hypercholesteremia    denies  . Irritable bowel syndrome (IBS)   . Obesity   . Occult GI bleeding 12/2008   Trivial upper GI bleed/uncontrolled GERD by upper endoscopy 12/2008, normal f/u endoscopy 03/2009 with SBCE at that time  . OSA on CPAP   . Pain management   . Pneumonia 09/01/2015   "on ATB still" (09/04/2015)  . S/P colonoscopy 11/10, 10/08   Dr [name]  . Schizophrenia [facility])   . Sleep apnea   . Thyroid  function test abnormal    Noted in 2011 discharge  . Tobacco dipper     Patient Active Problem List   Diagnosis Date Noted  . 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 Seasonal and perennial allergic rhinitis 07/23/2014  . Abdominal mass of other site 06/15/2012  . Abdominal cramps 06/15/2012  . Obstructive sleep apnea 05/03/2012  . Syncope and collapse 05/03/2012  . Obesity, morbid ([place]) 05/03/2012  . Anal fissure 05/27/2011  . UNSPECIFIED ANEMIA 09/11/2009  . Blood in stool 04/06/2009  . ABDOMINAL PAIN OTHER SPECIFIED SITE 03/07/2009  . BIPOLAR DISORDER UNSPECIFIED 03/06/2009  . SMOKELESS TOBACCO ABUSE 03/06/2009  . ATTENTION DEFICIT HYPERACTIVITY DISORDER 03/06/2009  . GERD 03/06/2009  . DIARRHEA 03/06/2009    Past Surgical History:  Procedure Laterality Date  . ANKLE FRACTURE SURGERY Left ~ 2008  . BACK SURGERY    . COLONOSCOPY  10/10/2009   anal papilla otherwise normal  . ESOPHAGOGASTRODUODENOSCOPY   04/07/2009   Normal esophagus, small hiatal hernia  . ESOPHAGOGASTRODUODENOSCOPY  01/09/2009   Distal esophageal erosions consistent with mild erosive reflux esophagitis, otherwise normal esophagus, small hiatal herniaotherwise normal stomach, D1-D2   . ESOPHAGOGASTRODUODENOSCOPY  08/26/2007   Normal esophagus, a small hiatal/hernia, otherwise normal stomach D1 through D3  . FRACTURE SURGERY    . ileocolonoscopy  08/26/2007  A normal rectum, colon, and terminal ileum  . INCISION AND DRAINAGE  09/04/2015   "reopened my back incsion"  . LUMBAR LAMINECTOMY/DECOMPRESSION MICRODISCECTOMY Left 09/23/2017   Procedure: Microdiscectomy - left - Lumbar four-Lumbar five;  Surgeon: Earnie Larsson, MD;  Location: [facility];  Service: Neurosurgery;  Laterality: Left;  . LUMBAR MICRODISCECTOMY  08/21/2015  . LUMBAR MICRODISCECTOMY Left 09/23/2017   L4-5  . LUMBAR WOUND DEBRIDEMENT N/A 09/04/2015   Procedure: LUMBAR WOUND DEBRIDEMENT;  Surgeon: Consuella Lose, MD;  Location: [facility];   Service: Neurosurgery;  Laterality: N/A;  . right side sugery     gland removed  . Small bowel capsule  04/11/2009    normal throughout  . VASECTOMY        Home Medications    Prior to Admission medications   Medication Sig Start Date End Date Taking? Authorizing Provider  cefdinir (OMNICEF) 300 MG capsule Take 1 capsule (300 mg total) by mouth 2 (two) times daily. 1 po BID 12/08/18   Ronnie Doss M, DO  ciprofloxacin (CIPRO) 500 MG tablet Take 1 tablet (500 mg total) by mouth 2 (two) times daily. 09/01/18   Claretta Fraise, MD  cyclobenzaprine (FLEXERIL) 10 MG tablet Take 1 tablet (10 mg total) by mouth 3 (three) times daily as needed for muscle spasms. 07/27/17   Milton Ferguson, MD  dicyclomine (BENTYL) 10 MG capsule Take 1 capsule (10 mg total) by mouth 4 (four) times daily -  before meals and at bedtime. 07/16/18   Cirigliano, Vito V, DO  docusate sodium (COLACE) 100 MG capsule Take 100 mg by mouth daily as needed for mild constipation. Bedtime     [provider]  EPINEPHrine 0.3 mg/0.3 mL IJ SOAJ injection Inject 0.3 mg into the muscle as needed (allergic reactions).     [provider]  gabapentin (NEURONTIN) 300 MG capsule Take 1 capsule (300 mg total) by mouth 3 (three) times daily. 09/08/18   [name], DO  guaiFENesin-codeine 100-10 MG/5ML syrup Take 5 mLs by mouth every 6 (six) hours as needed for cough. 12/08/18   [name], DO  levothyroxine (SYNTHROID, LEVOTHROID) 75 MCG tablet TAKE 1 TABLET BY MOUTH ONCE DAILY 10/19/18   Ronnie Doss M, DO  pantoprazole (PROTONIX) 40 MG tablet TAKE 1 TABLET BY MOUTH TWICE DAILY 09/07/18   Claretta Fraise, MD    Family History Family History  Problem Relation Age of Onset  . Leukemia Father 59  . Colon polyps Father   . Clotting disorder Father   . Seizures Mother   . Irritable bowel syndrome Mother   . Bipolar disorder Brother   . ADD / ADHD Brother   . Diabetes Paternal Grandmother   .  Ulcerative colitis Paternal Aunt   . Colon cancer Neg Hx   . Liver cancer Neg Hx   . Esophageal cancer Neg Hx     Social History Social History   Tobacco Use  . Smoking status: Never Smoker  . Smokeless tobacco: Current User    Types: Chew  Substance Use Topics  . Alcohol use: No    Alcohol/week: 0.0 standard drinks  . Drug use: No     Allergies   Ibuprofen; Influenza vaccines; Tylenol [acetaminophen]; Bee venom; and Tramadol   Review of Systems Review of Systems  Constitutional: Negative for activity change, appetite change, chills and fever.  HENT: Positive for congestion and sore throat. Negative for facial swelling, rhinorrhea and trouble swallowing.   Eyes: Negative for visual  disturbance.  Respiratory: Positive for cough and chest tightness. Negative for shortness of breath, wheezing and stridor.   Cardiovascular: Negative for chest pain.  Gastrointestinal: Negative for abdominal pain, diarrhea, nausea and vomiting.  Genitourinary: Negative for decreased urine volume and dysuria.  Musculoskeletal: Positive for myalgias (Generalized body aches). Negative for neck pain and neck stiffness.  Skin: Negative for rash.  Neurological: Positive for headaches. Negative for dizziness, syncope, weakness and numbness.  Hematological: Negative for adenopathy.  Psychiatric/Behavioral: Negative for confusion.     Physical Exam Updated Vital Signs BP 120/67 (BP Location: Left Arm)   Pulse (!) 109   Temp 99.7 F (37.6 C) (Oral)   Resp 20   Ht 5\' 8"  (1.727 m)   Wt (!) 190 kg   SpO2 100%   BMI 63.69 kg/m   Physical Exam Vitals signs and nursing note reviewed.  Constitutional:      General: He is not in acute distress.    Appearance: Normal appearance. He is well-developed. He is not ill-appearing.     Comments: Patient is morbidly obese  HENT:     Head: Normocephalic and atraumatic.     Jaw: No trismus.     Right Ear: Tympanic membrane and ear canal normal.     Left  Ear: Tympanic membrane and ear canal normal.     Nose: Mucosal edema and rhinorrhea present.     Mouth/Throat:     Pharynx: Uvula midline. Posterior oropharyngeal erythema present. No oropharyngeal exudate or uvula swelling.     Tonsils: No tonsillar abscesses.     Comments: Mild erythema of the oropharynx without edema or exudate.  Uvula is midline and nonedematous. Eyes:     Conjunctiva/sclera: Conjunctivae normal.  Neck:     Musculoskeletal: Normal range of motion. No neck rigidity.     Trachea: Phonation normal.     Meningeal: Kernig's sign absent.  Cardiovascular:     Rate and Rhythm: Normal rate and regular rhythm.     Pulses: Normal pulses.  Pulmonary:     Effort: Pulmonary effort is normal. No respiratory distress.     Breath sounds: Normal breath sounds. No wheezing or rales.  Abdominal:     General: There is no distension.     Palpations: Abdomen is soft.     Tenderness: There is no abdominal tenderness. There is no guarding or rebound.  Musculoskeletal: Normal range of motion.  Lymphadenopathy:     Cervical: No cervical adenopathy.  Skin:    General: Skin is warm.     Capillary Refill: Capillary refill takes less than 2 seconds.     Findings: No rash.  Neurological:     Mental Status: He is alert and oriented to person, place, and time. Mental status is at baseline.     Sensory: No sensory deficit.     Motor: No weakness or abnormal muscle tone.     Gait: Gait normal.      ED Treatments / Results  Labs (all labs ordered are listed, but only abnormal results are displayed) Labs Reviewed - No data to display  EKG None  Radiology No results found.  Procedures Procedures (including critical care time)  Medications Ordered in ED Medications - No data to display   Initial Impression / Assessment and Plan / ED Course  I have reviewed the triage vital signs and the nursing notes.  Pertinent labs & imaging results that were available during my care of the  patient were reviewed by me and  considered in my medical decision making (see chart for details).     Patient is well-appearing.  Nontoxic.  He is here with flulike symptoms and reports onset this morning.  No concerning symptoms for peritonsillar or retropharyngeal abscess, no nuchal rigidity or focal neuro deficits on his exam.  His daughter also has similar symptoms and is currently taking Tamiflu and he is requesting a prescription for the same.  He appears appropriate for discharge home, and agrees to close follow-up with his PCP if needed.  Return precautions were also discussed.  Final Clinical Impressions(s) / ED Diagnoses   Final diagnoses:  Influenza-like illness    ED Discharge Orders    None       Kem Parkinson, PA-C 01/09/19 1400    Daleen Bo, MD 01/09/19 1551

## 2019-01-09 NOTE — Discharge Instructions (Addendum)
Drink plenty of fluids.  Take the medication as directed until it is finished.  Follow-up with your primary doctor for recheck, return to ER for any worsening symptoms.

## 2019-01-12 ENCOUNTER — Ambulatory Visit (INDEPENDENT_AMBULATORY_CARE_PROVIDER_SITE_OTHER): Payer: BLUE CROSS/BLUE SHIELD | Admitting: Nurse Practitioner

## 2019-01-12 ENCOUNTER — Ambulatory Visit (INDEPENDENT_AMBULATORY_CARE_PROVIDER_SITE_OTHER): Payer: BLUE CROSS/BLUE SHIELD

## 2019-01-12 ENCOUNTER — Encounter: Payer: Self-pay | Admitting: Nurse Practitioner

## 2019-01-12 VITALS — BP 116/72 | HR 98 | Temp 97.2°F | Ht 68.0 in | Wt >= 6400 oz

## 2019-01-12 DIAGNOSIS — R05 Cough: Secondary | ICD-10-CM | POA: Diagnosis not present

## 2019-01-12 DIAGNOSIS — R059 Cough, unspecified: Secondary | ICD-10-CM

## 2019-01-12 NOTE — Patient Instructions (Signed)

## 2019-01-12 NOTE — Progress Notes (Signed)
Subjective:    Patient ID: [name], male    DOB: 03-May-1986, 33 y.o.   MRN: [id number]   Chief Complaint: diagnosed with flu Sat (Gave meds but says he feels worse. Thinks he has pneumonia)   HPI Patient comes in today and says he still feels bad. He was seen on  01/09/19 in [place] ED and was diagnosed with the flu. His wife and daughter tested positive for flu on 01/08/19. He was not tested, but was just given tamiflu.  He is still taking tamiflu. He says he is still coughing, sob and fever, 101 this morning.  Review of Systems  Constitutional: Positive for chills and fever.  HENT: Positive for congestion, postnasal drip and rhinorrhea.   Respiratory: Positive for cough and shortness of breath.   Cardiovascular: Negative for chest pain, palpitations and leg swelling.  Gastrointestinal: Negative.   Musculoskeletal: Positive for myalgias.  Neurological: Positive for headaches.  Psychiatric/Behavioral: Negative.   All other systems reviewed and are negative.      Objective:   Physical Exam Vitals signs and nursing note reviewed.  Constitutional:      General: He is not in acute distress.    Appearance: Normal appearance. He is normal weight.  HENT:     Right Ear: Hearing, tympanic membrane, ear canal and external ear normal.     Left Ear: Hearing, tympanic membrane, ear canal and external ear normal.     Nose: Mucosal edema, congestion and rhinorrhea present.     Right Sinus: No maxillary sinus tenderness or frontal sinus tenderness.     Left Sinus: No maxillary sinus tenderness or frontal sinus tenderness.     Mouth/Throat:     Pharynx: Oropharynx is clear. Uvula midline.  Cardiovascular:     Rate and Rhythm: Normal rate and regular rhythm.     Heart sounds: Normal heart sounds.  Pulmonary:     Effort: Pulmonary effort is normal.     Breath sounds: Normal breath sounds.  Skin:    General: Skin is warm and dry.  Neurological:     General: No focal deficit  present.     Mental Status: He is alert and oriented to person, place, and time.  Psychiatric:        Mood and Affect: Mood normal.        Behavior: Behavior normal.    Chest x ray- clear- unchanged from previous-Preliminary reading by Ronnald Collum, FNP  [facility]     BP 116/72   Pulse 98   Temp (!) 97.2 F (36.2 C) (Oral)   Ht 5\' 8"  (1.727 m)   Wt (!) 406 lb (184.2 kg)   BMI 61.73 kg/m      Assessment & Plan:  [name] in today with chief complaint of diagnosed with flu Sat (Gave meds but says he feels worse. Thinks he has pneumonia)   1. Cough 1. Take meds as prescribed 2. Use a cool mist humidifier especially during the winter months and when heat has been humid. 3. Use saline nose sprays frequently 4. Saline irrigations of the nose can be very helpful if done frequently.  * 4X daily for 1 week*  * Use of a nettie pot can be helpful with this. Follow directions with this* 5. Drink plenty of fluids 6. Keep thermostat turn down low 7.For any cough or congestion  Use plain Mucinex- regular strength or max strength is fine   * Children- consult with Pharmacist for dosing 8.  For fever or aces or pains- take tylenol or ibuprofen appropriate for age and weight.  * for fevers greater than 101 orally you may alternate ibuprofen and tylenol every  3 hours.   Make finish tamiflu  - DG Chest 2 View; Future  Mary-Margaret Hassell Done, FNP

## 2019-01-25 ENCOUNTER — Encounter: Payer: Self-pay | Admitting: Nurse Practitioner

## 2019-01-25 ENCOUNTER — Ambulatory Visit (INDEPENDENT_AMBULATORY_CARE_PROVIDER_SITE_OTHER): Payer: BLUE CROSS/BLUE SHIELD | Admitting: Nurse Practitioner

## 2019-01-25 VITALS — BP 134/74 | HR 105 | Temp 97.0°F | Ht 68.0 in | Wt >= 6400 oz

## 2019-01-25 DIAGNOSIS — R059 Cough, unspecified: Secondary | ICD-10-CM

## 2019-01-25 DIAGNOSIS — J0110 Acute frontal sinusitis, unspecified: Secondary | ICD-10-CM

## 2019-01-25 DIAGNOSIS — R05 Cough: Secondary | ICD-10-CM

## 2019-01-25 MED ORDER — HYDROCODONE-HOMATROPINE 5-1.5 MG/5ML PO SYRP: 5.0000 mL | ORAL_SOLUTION | Freq: Four times a day (QID) | ORAL | 0 refills | Status: DC | PRN

## 2019-01-25 MED ORDER — CYCLOBENZAPRINE HCL 10 MG PO TABS: 10.0000 mg | ORAL_TABLET | Freq: Three times a day (TID) | ORAL | 0 refills | Status: DC | PRN

## 2019-01-25 MED ORDER — AMOXICILLIN-POT CLAVULANATE 875-125 MG PO TABS: 1.0000 | ORAL_TABLET | Freq: Two times a day (BID) | ORAL | 0 refills | Status: DC

## 2019-01-25 NOTE — Patient Instructions (Signed)

## 2019-01-25 NOTE — Progress Notes (Signed)
Subjective:    Patient ID: [name], male    DOB: [birth year], 33 y.o.   MRN: [id number]   Chief Complaint: Sinusitis (HA, green congestion, facial pressure )   HPI Patient comes in today c/lo congestion and sinus pressure for over 2 weeks. Has gotten worse and now he has a headache that will not go away.   Review of Systems  Constitutional: Negative for chills and fever.  HENT: Positive for congestion, sinus pressure and sinus pain. Negative for trouble swallowing.   Respiratory: Positive for cough.   Cardiovascular: Negative.   Gastrointestinal: Negative.   Musculoskeletal: Negative for myalgias.  Neurological: Positive for headaches.  All other systems reviewed and are negative.      Objective:   Physical Exam Constitutional:      Appearance: He is obese.  HENT:     Right Ear: Hearing, tympanic membrane, ear canal and external ear normal.     Left Ear: Hearing, tympanic membrane, ear canal and external ear normal.     Nose:     Right Turbinates: Pale.     Left Turbinates: Swollen and pale.     Right Sinus: Frontal sinus tenderness present. No maxillary sinus tenderness.     Left Sinus: Frontal sinus tenderness present. No maxillary sinus tenderness.     Mouth/Throat:     Pharynx: Oropharynx is clear.     Tonsils: Swelling: 0 on the right. 0 on the left.  Neck:     Musculoskeletal: Normal range of motion and neck supple.  Cardiovascular:     Rate and Rhythm: Normal rate and regular rhythm.     Heart sounds: Normal heart sounds.  Pulmonary:     Effort: Pulmonary effort is normal.     Breath sounds: Normal breath sounds.  Lymphadenopathy:     Cervical: No cervical adenopathy.  Skin:    General: Skin is warm and dry.  Neurological:     General: No focal deficit present.     Mental Status: He is alert and oriented to person, place, and time.  Psychiatric:        Mood and Affect: Mood normal.        Behavior: Behavior normal.    BP 134/74 (BP  Location: Right Arm)   Pulse (!) 105   Temp (!) 97 F (36.1 C) (Oral)   Ht 5\' 8"  (1.727 m)   Wt (!) 407 lb (184.6 kg)   BMI 61.88 kg/m         Assessment & Plan:  [name] in today with chief complaint of Sinusitis (HA, green congestion, facial pressure )   1. Acute non-recurrent frontal sinusitis 1. Take meds as prescribed 2. Use a cool mist humidifier especially during the winter months and when heat has been humid. 3. Use saline nose sprays frequently 4. Saline irrigations of the nose can be very helpful if done frequently.  * 4X daily for 1 week*  * Use of a nettie pot can be helpful with this. Follow directions with this* 5. Drink plenty of fluids 6. Keep thermostat turn down low 7.For any cough or congestion  Use plain Mucinex- regular strength or max strength is fine   * Children- consult with Pharmacist for dosing 8. For fever or aces or pains- take tylenol or ibuprofen appropriate for age and weight.  * for fevers greater than 101 orally you may alternate ibuprofen and tylenol every  3 hours.    - amoxicillin-clavulanate (AUGMENTIN) 875-125 MG  tablet; Take 1 tablet by mouth 2 (two) times daily.  Dispense: 14 tablet; Refill: 0  2. Cough Force fluids - HYDROcodone-homatropine (HYCODAN) 5-1.5 MG/5ML syrup; Take 5 mLs by mouth every 6 (six) hours as needed for cough.  Dispense: 120 mL; Refill: 0  Mary-Margaret Hassell Done, FNP

## 2019-01-29 DIAGNOSIS — G4733 Obstructive sleep apnea (adult) (pediatric): Secondary | ICD-10-CM | POA: Diagnosis not present

## 2019-02-01 ENCOUNTER — Other Ambulatory Visit: Payer: Self-pay

## 2019-02-01 ENCOUNTER — Encounter (HOSPITAL_COMMUNITY): Payer: Self-pay | Admitting: Emergency Medicine

## 2019-02-01 ENCOUNTER — Emergency Department (HOSPITAL_COMMUNITY)
Admission: EM | Admit: 2019-02-01 | Discharge: 2019-02-02 | Disposition: A | Discharge status: Home / Self Care | Payer: BLUE CROSS/BLUE SHIELD | Attending: Emergency Medicine | Admitting: Emergency Medicine

## 2019-02-01 ENCOUNTER — Emergency Department (HOSPITAL_COMMUNITY): Payer: BLUE CROSS/BLUE SHIELD

## 2019-02-01 DIAGNOSIS — R69 Illness, unspecified: Secondary | ICD-10-CM

## 2019-02-01 DIAGNOSIS — R000 Tachycardia, unspecified: Secondary | ICD-10-CM | POA: Diagnosis not present

## 2019-02-01 DIAGNOSIS — J111 Influenza due to unidentified influenza virus with other respiratory manifestations: Secondary | ICD-10-CM | POA: Diagnosis not present

## 2019-02-01 DIAGNOSIS — R0602 Shortness of breath: Secondary | ICD-10-CM | POA: Diagnosis not present

## 2019-02-01 DIAGNOSIS — R51 Headache: Secondary | ICD-10-CM | POA: Diagnosis not present

## 2019-02-01 DIAGNOSIS — Z79899 Other long term (current) drug therapy: Secondary | ICD-10-CM | POA: Diagnosis not present

## 2019-02-01 DIAGNOSIS — R0689 Other abnormalities of breathing: Secondary | ICD-10-CM | POA: Diagnosis not present

## 2019-02-01 LAB — COMPREHENSIVE METABOLIC PANEL
ALK PHOS: 53 U/L (ref 38–126)
ALT: 28 U/L (ref 0–44)
AST: 24 U/L (ref 15–41)
Albumin: 3.8 g/dL (ref 3.5–5.0)
Anion gap: 8 (ref 5–15)
BUN: 14 mg/dL (ref 6–20)
CO2: 25 mmol/L (ref 22–32)
CREATININE: 1.03 mg/dL (ref 0.61–1.24)
Calcium: 7.9 mg/dL — ABNORMAL LOW (ref 8.9–10.3)
Chloride: 104 mmol/L (ref 98–111)
GFR calc Af Amer: >60 mL/min (ref >60)
GFR calc non Af Amer: >60 mL/min (ref >60)
Glucose, Bld: 89 mg/dL (ref 70–99)
Potassium: 3.4 mmol/L — ABNORMAL LOW (ref 3.5–5.1)
Sodium: 137 mmol/L (ref 135–145)
Total Bilirubin: 0.6 mg/dL (ref 0.3–1.2)
Total Protein: 6.4 g/dL — ABNORMAL LOW (ref 6.5–8.1)

## 2019-02-01 LAB — CBC WITH DIFFERENTIAL/PLATELET
Abs Immature Granulocytes: 0.03 10*3/uL (ref 0.00–0.07)
Basophils Absolute: 0 10*3/uL (ref 0.0–0.1)
Basophils Relative: 0 %
Eosinophils Absolute: 0 10*3/uL (ref 0.0–0.5)
Eosinophils Relative: 0 %
HCT: 47.3 % (ref 39.0–52.0)
Hemoglobin: 15 g/dL (ref 13.0–17.0)
Immature Granulocytes: 0 %
LYMPHS ABS: 0.6 10*3/uL — AB (ref 0.7–4.0)
Lymphocytes Relative: 8 %
MCH: 27 pg (ref 26.0–34.0)
MCHC: 31.7 g/dL (ref 30.0–36.0)
MCV: 85.2 fL (ref 80.0–100.0)
Monocytes Absolute: 0.5 10*3/uL (ref 0.1–1.0)
Monocytes Relative: 6 %
Neutro Abs: 6.8 10*3/uL (ref 1.7–7.7)
Neutrophils Relative %: 86 %
Platelets: 245 10*3/uL (ref 150–400)
RBC: 5.55 MIL/uL (ref 4.22–5.81)
RDW: 14.9 % (ref 11.5–15.5)
WBC: 7.9 10*3/uL (ref 4.0–10.5)
nRBC: 0 % (ref 0.0–0.2)

## 2019-02-01 LAB — LACTIC ACID, PLASMA: Lactic Acid, Venous: 1.2 mmol/L (ref 0.5–1.9)

## 2019-02-01 LAB — INFLUENZA PANEL BY PCR (TYPE A & B)
Influenza A By PCR: NEGATIVE
Influenza B By PCR: NEGATIVE

## 2019-02-01 MED ORDER — ACETAMINOPHEN 500 MG PO TABS
1000.0000 mg | ORAL_TABLET | Freq: Once | ORAL | Status: AC
  Administered 2019-02-01: 1000 mg via ORAL
  Filled 2019-02-01: qty 2

## 2019-02-01 MED ORDER — DIPHENHYDRAMINE HCL 50 MG/ML IJ SOLN
12.5000 mg | Freq: Once | INTRAMUSCULAR | Status: AC
  Administered 2019-02-01: 12.5 mg via INTRAVENOUS
  Filled 2019-02-01: qty 1

## 2019-02-01 MED ORDER — ONDANSETRON HCL 4 MG/2ML IJ SOLN
4.0000 mg | Freq: Once | INTRAMUSCULAR | Status: AC
  Administered 2019-02-01: 4 mg via INTRAVENOUS
  Filled 2019-02-01: qty 2

## 2019-02-01 MED ORDER — SODIUM CHLORIDE 0.9 % IV BOLUS
1000.0000 mL | Freq: Once | INTRAVENOUS | Status: AC
  Administered 2019-02-01: 1000 mL via INTRAVENOUS

## 2019-02-01 NOTE — ED Triage Notes (Signed)
Pt brought in by [facility] with flu-like symptoms. States has had headache since yesterday and dizziness and SOB since this afternoon.

## 2019-02-02 MED ORDER — MORPHINE SULFATE (PF) 2 MG/ML IV SOLN
2.0000 mg | Freq: Once | INTRAVENOUS | Status: AC
  Administered 2019-02-02: 2 mg via INTRAVENOUS
  Filled 2019-02-02: qty 1

## 2019-02-02 NOTE — ED Provider Notes (Signed)
[facility] EMERGENCY DEPARTMENT Provider Note   CSN: [id number] Arrival date & time: 02/01/19  2014    History   Chief Complaint Chief Complaint  Patient presents with  . Shortness of Breath    HPI [name] is a 33 y.o. male.     HPI  [name] is a 33 y.o. male who presents to the Emergency Department complaining of frontal headache, dizziness, cough, generalized body aches, and shortness of breath associated with cough.  Symptoms began yesterday.  Today, he reports having shaking chills.  Unsure of fever at home.  He describes his cough as productive of dark-colored sputum.  He states that he had 2 episodes of influenza and was diagnosed with pneumonia last month.  States he did not receive a flu vaccine because he is allergic to the vaccine.  He describes the headache as a throbbing sensation across his forehead.  He states that he cannot take Tylenol or ibuprofen due to a history of bleeding ulcers.  He was seen on 01/25/2019 by his PCP and is currently taking Augmentin for a sinusitis. He denies neck pain or stiffness, visual changes, chest pain or abdominal pain, vomiting or dysuria. No recent travel   Past Medical History:  Diagnosis Date  . ADHD (attention deficit hyperactivity disorder)   . Anal fissure   . Anxiety   . Bipolar disorder ([place])   . Chronic lower back pain   . Depression   . Esophagitis    Distal esophageal erosions consistent with mild erosive  reflux esophagitis 12/2008 by EGD   . Gastric ulcer   . GERD (gastroesophageal reflux disease)   . Hiatal hernia   . History of stomach ulcers   . Hx MRSA infection 8/08   right thigh  . Hx of colonic polyps   . Hypercholesteremia    denies  . Irritable bowel syndrome (IBS)   . Obesity   . Occult GI bleeding 12/2008   Trivial upper GI bleed/uncontrolled GERD by upper endoscopy 12/2008, normal f/u endoscopy 03/2009 with SBCE at that time  . OSA on CPAP   . Pain management   . Pneumonia  09/01/2015   "on ATB still" (09/04/2015)  . S/P colonoscopy 11/10, 10/08   Dr [name]  . Schizophrenia [facility])   . Sleep apnea   . Thyroid function test abnormal    Noted in 2011 discharge  . Tobacco dipper     Patient Active Problem List   Diagnosis Date Noted  . 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 Seasonal and perennial allergic rhinitis 07/23/2014  . Abdominal mass of other site 06/15/2012  . Abdominal cramps 06/15/2012  . Obstructive sleep apnea 05/03/2012  . Syncope and collapse 05/03/2012  . Obesity, morbid ([place]) 05/03/2012  . Anal fissure 05/27/2011  . UNSPECIFIED ANEMIA 09/11/2009  . Blood in stool 04/06/2009  . ABDOMINAL PAIN OTHER SPECIFIED SITE 03/07/2009  . BIPOLAR DISORDER UNSPECIFIED 03/06/2009  . SMOKELESS TOBACCO ABUSE 03/06/2009  . ATTENTION DEFICIT HYPERACTIVITY DISORDER 03/06/2009  . GERD 03/06/2009  . DIARRHEA 03/06/2009    Past Surgical History:  Procedure Laterality Date  . ANKLE FRACTURE SURGERY Left ~ 2008  . BACK SURGERY    . COLONOSCOPY  10/10/2009   anal papilla otherwise normal  . ESOPHAGOGASTRODUODENOSCOPY   04/07/2009   Normal esophagus, small hiatal hernia  . ESOPHAGOGASTRODUODENOSCOPY  01/09/2009   Distal esophageal erosions consistent with mild erosive reflux esophagitis, otherwise normal esophagus, small hiatal herniaotherwise normal stomach, D1-D2   .  ESOPHAGOGASTRODUODENOSCOPY  08/26/2007   Normal esophagus, a small hiatal/hernia, otherwise normal stomach D1 through D3  . FRACTURE SURGERY    . ileocolonoscopy  08/26/2007    A normal rectum, colon, and terminal ileum  . INCISION AND DRAINAGE  09/04/2015   "reopened my back incsion"  . LUMBAR LAMINECTOMY/DECOMPRESSION MICRODISCECTOMY Left 09/23/2017   Procedure: Microdiscectomy - left - Lumbar four-Lumbar five;  Surgeon: Earnie Larsson, MD;  Location: [place];  Service: Neurosurgery;  Laterality: Left;  . LUMBAR MICRODISCECTOMY  08/21/2015  . LUMBAR MICRODISCECTOMY Left 09/23/2017    L4-5  . LUMBAR WOUND DEBRIDEMENT N/A 09/04/2015   Procedure: LUMBAR WOUND DEBRIDEMENT;  Surgeon: Consuella Lose, MD;  Location: [facility];  Service: Neurosurgery;  Laterality: N/A;  . right side sugery     gland removed  . Small bowel capsule  04/11/2009    normal throughout  . VASECTOMY          Home Medications    Prior to Admission medications   Medication Sig Start Date End Date Taking? Authorizing Provider  amoxicillin-clavulanate (AUGMENTIN) 875-125 MG tablet Take 1 tablet by mouth 2 (two) times daily. 01/25/19  Yes Hassell Done, Mary-Margaret, FNP  cyclobenzaprine (FLEXERIL) 10 MG tablet Take 1 tablet (10 mg total) by mouth 3 (three) times daily as needed for muscle spasms. 01/25/19  Yes Martin, Mary-Margaret, FNP  docusate sodium (COLACE) 100 MG capsule Take 100 mg by mouth daily as needed for mild constipation. Bedtime    Yes [provider]  EPINEPHrine 0.3 mg/0.3 mL IJ SOAJ injection Inject 0.3 mg into the muscle as needed (allergic reactions).    Yes [provider]  gabapentin (NEURONTIN) 300 MG capsule TAKE 1 CAPSULE BY MOUTH THREE TIMES DAILY Patient taking differently: Take 300 mg by mouth 3 (three) times daily.  01/11/19  Yes Gottschalk, Ashly M, DO  HYDROcodone-homatropine (HYCODAN) 5-1.5 MG/5ML syrup Take 5 mLs by mouth every 6 (six) hours as needed for cough. 01/25/19  Yes Martin, Mary-Margaret, FNP  levothyroxine (SYNTHROID, LEVOTHROID) 75 MCG tablet TAKE 1 TABLET BY MOUTH ONCE DAILY Patient taking differently: Take 75 mcg by mouth daily before breakfast.  01/11/19  Yes Gottschalk, Ashly M, DO  pantoprazole (PROTONIX) 40 MG tablet TAKE 1 TABLET BY MOUTH TWICE DAILY Patient taking differently: Take 40 mg by mouth 2 (two) times daily.  09/07/18  Yes Claretta Fraise, MD  dicyclomine (BENTYL) 10 MG capsule Take 1 capsule (10 mg total) by mouth 4 (four) times daily -  before meals and at bedtime. Patient not taking: Reported on 01/25/2019 07/16/18   Lavena Bullion, DO    Family History Family History  Problem Relation Age of Onset  . Leukemia Father 58  . Colon polyps Father   . Clotting disorder Father   . Seizures Mother   . Irritable bowel syndrome Mother   . Bipolar disorder Brother   . ADD / ADHD Brother   . Diabetes Paternal Grandmother   . Ulcerative colitis Paternal Aunt   . Colon cancer Neg Hx   . Liver cancer Neg Hx   . Esophageal cancer Neg Hx     Social History Social History   Tobacco Use  . Smoking status: Never Smoker  . Smokeless tobacco: Current User    Types: Chew  Substance Use Topics  . Alcohol use: No    Alcohol/week: 0.0 standard drinks  . Drug use: No     Allergies   Ibuprofen; Influenza vaccines; Tylenol [acetaminophen]; Bee venom; and Tramadol  Review of Systems Review of Systems  Constitutional: Positive for chills and fever. Negative for activity change and appetite change.  HENT: Positive for congestion. Negative for facial swelling, rhinorrhea, sore throat and trouble swallowing.   Eyes: Negative for visual disturbance.  Respiratory: Positive for cough and shortness of breath. Negative for wheezing and stridor.   Cardiovascular: Negative for chest pain.  Gastrointestinal: Negative for abdominal pain, nausea and vomiting.  Genitourinary: Negative for decreased urine volume, difficulty urinating and dysuria.  Musculoskeletal: Positive for myalgias (Generalized body aches). Negative for neck pain and neck stiffness.  Skin: Negative for rash.  Neurological: Positive for dizziness and headaches. Negative for weakness and numbness.  Hematological: Negative for adenopathy.  Psychiatric/Behavioral: Negative for confusion.     Physical Exam Updated Vital Signs BP (!) 117/53   Pulse (!) 124   Temp (!) 102 F (38.9 C) (Oral)   Resp (!) 24   Ht 5\' 8"  (1.727 m)   Wt (!) 184.2 kg   SpO2 97%   BMI 61.73 kg/m   Physical Exam Vitals signs and nursing note reviewed.  Constitutional:       Appearance: He is well-developed.     Comments: Patient is morbidly obese.  HENT:     Right Ear: Tympanic membrane and ear canal normal.     Left Ear: Tympanic membrane and ear canal normal.     Mouth/Throat:     Mouth: Mucous membranes are moist.     Pharynx: Oropharynx is clear. No oropharyngeal exudate or posterior oropharyngeal erythema.  Neck:     Musculoskeletal: Full passive range of motion without pain and normal range of motion. No neck rigidity.     Meningeal: Kernig's sign absent.  Cardiovascular:     Rate and Rhythm: Regular rhythm. Tachycardia present.     Pulses: Normal pulses.  Pulmonary:     Effort: Pulmonary effort is normal.     Breath sounds: No wheezing.     Comments: Coarse lung sounds bilaterally without wheezing or rales.  No respiratory distress noted. Abdominal:     Palpations: Abdomen is soft.     Tenderness: There is no abdominal tenderness.  Musculoskeletal: Normal range of motion.     Right lower leg: No edema.     Left lower leg: No edema.  Skin:    General: Skin is warm.     Capillary Refill: Capillary refill takes less than 2 seconds.     Findings: No rash.  Neurological:     General: No focal deficit present.     Mental Status: He is alert.     Sensory: Sensation is intact. No sensory deficit.     Motor: Motor function is intact. No weakness.     Coordination: Coordination is intact.     Comments: CN II-XII grossly intact      ED Treatments / Results  Labs (all labs ordered are listed, but only abnormal results are displayed) Labs Reviewed  CBC WITH DIFFERENTIAL/PLATELET - Abnormal; Notable for the following components:      Result Value   Lymphs Abs 0.6 (*)    All other components within normal limits  COMPREHENSIVE METABOLIC PANEL - Abnormal; Notable for the following components:   Potassium 3.4 (*)    Calcium 7.9 (*)    Total Protein 6.4 (*)    All other components within normal limits  INFLUENZA PANEL BY PCR (TYPE A & B)    LACTIC ACID, PLASMA    EKG None  Radiology Dg  Chest 2 View  Result Date: 02/01/2019 CLINICAL DATA:  33 y/o M; flu like symptoms, dizziness, shortness of breath. EXAM: CHEST - 2 VIEW COMPARISON:  01/12/2019 chest radiograph. FINDINGS: Stable heart size and mediastinal contours are within normal limits. Both lungs are clear. The visualized skeletal structures are unremarkable. IMPRESSION: No acute pulmonary process identified. Electronically Signed   By: Kristine Garbe M.D.   On: 02/01/2019 22:12    Procedures Procedures (including critical care time)  Medications Ordered in ED Medications  sodium chloride 0.9 % bolus 1,000 mL (0 mLs Intravenous Stopped 02/02/19 0020)  ondansetron (ZOFRAN) injection 4 mg (4 mg Intravenous Given 02/01/19 2323)  diphenhydrAMINE (BENADRYL) injection 12.5 mg (12.5 mg Intravenous Given 02/01/19 2323)  acetaminophen (TYLENOL) tablet 1,000 mg (1,000 mg Oral Given 02/01/19 2323)  morphine 2 MG/ML injection 2 mg (2 mg Intravenous Given 02/02/19 0023)     Initial Impression / Assessment and Plan / ED Course  I have reviewed the triage vital signs and the nursing notes.  Pertinent labs & imaging results that were available during my care of the patient were reviewed by me and considered in my medical decision making (see chart for details).        Patient is non-toxic appearing, influenza testing is negative, chest x-ray is reassuring.  He is febrile and tachycardic.  He reports history of Tylenol allergy, but on further history taking, patient states he was advised not to take Tylenol due to history of peptic ulcer disease.  I feel that the benefit of oral Tylenol given his current fever of 102 would be beneficial.  We will also administer IV fluids and oral fluid trial.  Feel his symptoms are most likely viral.  Patient findings and care plan discussed with Dr. Sabra Heck.  0145  On recheck, pt reports feeling better and states he is ready for d/c home.   symptoms resolved after fever reduction. Vitals improved.  He has ambulated in the dept several times to the restroom unassisted and without difficulty, gait steady and he is tolerating oral fluids.  He is currently taking antibiotics prescribed by his PCP, I have advised him to continue this course.  I feel that he is appropriate for discharge home, he agrees to close outpatient follow-up and return precautions were also given.  Final Clinical Impressions(s) / ED Diagnoses   Final diagnoses:  Influenza-like illness    ED Discharge Orders    None       Kem Parkinson, PA-C 02/02/19 0217    Noemi Chapel, MD 02/03/19 1011

## 2019-02-02 NOTE — Discharge Instructions (Addendum)
Drink plenty of fluids, continue your current medications as directed.  Follow-up with your primary doctor for recheck or return to the ER for any worsening symptoms.

## 2019-02-02 NOTE — ED Notes (Signed)
Patient educated about not driving or performing other critical tasks (such as operating heavy machinery, caring for infant/toddler/child) due to sedative nature of narcotic medications received while in the ED.  Pt/caregiver verbalized understanding. Ambulated to restroom, reports episode of diarrhea. Headache 8/10.

## 2019-02-04 DIAGNOSIS — M791 Myalgia, unspecified site: Secondary | ICD-10-CM | POA: Diagnosis not present

## 2019-02-04 DIAGNOSIS — R197 Diarrhea, unspecified: Secondary | ICD-10-CM | POA: Diagnosis not present

## 2019-02-04 DIAGNOSIS — Z91012 Allergy to eggs: Secondary | ICD-10-CM | POA: Diagnosis not present

## 2019-02-04 DIAGNOSIS — F17220 Nicotine dependence, chewing tobacco, uncomplicated: Secondary | ICD-10-CM | POA: Diagnosis not present

## 2019-02-04 DIAGNOSIS — Z887 Allergy status to serum and vaccine status: Secondary | ICD-10-CM | POA: Diagnosis not present

## 2019-02-04 DIAGNOSIS — J3489 Other specified disorders of nose and nasal sinuses: Secondary | ICD-10-CM | POA: Diagnosis not present

## 2019-02-04 DIAGNOSIS — Z79899 Other long term (current) drug therapy: Secondary | ICD-10-CM | POA: Diagnosis not present

## 2019-02-04 DIAGNOSIS — R0602 Shortness of breath: Secondary | ICD-10-CM | POA: Diagnosis not present

## 2019-02-04 DIAGNOSIS — R509 Fever, unspecified: Secondary | ICD-10-CM | POA: Diagnosis not present

## 2019-02-04 DIAGNOSIS — R05 Cough: Secondary | ICD-10-CM | POA: Diagnosis not present

## 2019-02-04 DIAGNOSIS — Z886 Allergy status to analgesic agent status: Secondary | ICD-10-CM | POA: Diagnosis not present

## 2019-02-20 ENCOUNTER — Other Ambulatory Visit: Payer: Self-pay | Admitting: Family Medicine

## 2019-02-20 DIAGNOSIS — K219 Gastro-esophageal reflux disease without esophagitis: Secondary | ICD-10-CM

## 2019-02-27 ENCOUNTER — Other Ambulatory Visit: Payer: Self-pay | Admitting: Family Medicine

## 2019-03-01 DIAGNOSIS — G4733 Obstructive sleep apnea (adult) (pediatric): Secondary | ICD-10-CM | POA: Diagnosis not present

## 2019-03-11 ENCOUNTER — Other Ambulatory Visit: Payer: Self-pay

## 2019-03-11 ENCOUNTER — Encounter: Payer: Self-pay | Admitting: Family Medicine

## 2019-03-11 ENCOUNTER — Ambulatory Visit (INDEPENDENT_AMBULATORY_CARE_PROVIDER_SITE_OTHER): Payer: BLUE CROSS/BLUE SHIELD | Admitting: Family Medicine

## 2019-03-11 DIAGNOSIS — H1032 Unspecified acute conjunctivitis, left eye: Secondary | ICD-10-CM | POA: Diagnosis not present

## 2019-03-11 MED ORDER — TOBRAMYCIN-DEXAMETHASONE 0.3-0.1 % OP SUSP: OPHTHALMIC | 0 refills | Status: DC

## 2019-03-11 NOTE — Progress Notes (Signed)
No chief complaint on file.   HPI  Patient presents today for pocket in eye at medial epicanthus. Denies vision change. Makes his eye flutter like wind blowing in it. Onset 2 days ago. Increasing. Has had in the past. Treated with drops. Some mattering at medial epicanthus  PMH: Smoking status noted ROS: Per HPI   1. Acute bacterial conjunctivitis of left eye     Meds ordered this encounter  Medications  . tobramycin-dexamethasone (TOBRADEX) ophthalmic solution    Sig: Apply 1 drop in affected eye(s) every 2 hours for two days. Then every 4 hours for 5 days.    Dispense:  5 mL    Refill:  0    No orders of the defined types were placed in this encounter. Virtual Visit via telephone Note  I discussed the limitations, risks, security and privacy concerns of performing an evaluation and management service by telephone and the availability of in person appointments. I also discussed with the patient that there may be a patient responsible charge related to this service. The patient expressed understanding and agreed to proceed. Pt. Is at home. Dr. Livia Snellen is in his office.  Follow Up Instructions:   I discussed the assessment and treatment plan with the patient. The patient was provided an opportunity to ask questions and all were answered. The patient agreed with the plan and demonstrated an understanding of the instructions.   The patient was advised to call back or seek an in-person evaluation if the symptoms worsen or if the condition fails to improve as anticipated.  Visit started: 3:30 Call ended:  3:34 Total minutes including chart review and phone contact time: 4   Follow up as needed.  Claretta Fraise, MD

## 2019-03-25 ENCOUNTER — Other Ambulatory Visit: Payer: Self-pay | Admitting: Family Medicine

## 2019-03-25 DIAGNOSIS — K219 Gastro-esophageal reflux disease without esophagitis: Secondary | ICD-10-CM

## 2019-04-09 ENCOUNTER — Other Ambulatory Visit: Payer: Self-pay

## 2019-04-09 ENCOUNTER — Ambulatory Visit (INDEPENDENT_AMBULATORY_CARE_PROVIDER_SITE_OTHER): Payer: BLUE CROSS/BLUE SHIELD | Admitting: Family Medicine

## 2019-04-09 DIAGNOSIS — M5126 Other intervertebral disc displacement, lumbar region: Secondary | ICD-10-CM | POA: Diagnosis not present

## 2019-04-09 DIAGNOSIS — F3177 Bipolar disorder, in partial remission, most recent episode mixed: Secondary | ICD-10-CM | POA: Diagnosis not present

## 2019-04-09 MED ORDER — GABAPENTIN 300 MG PO CAPS: 300.0000 mg | ORAL_CAPSULE | Freq: Three times a day (TID) | ORAL | 3 refills | Status: DC

## 2019-04-09 MED ORDER — LAMOTRIGINE 25 MG PO TABS: ORAL_TABLET | ORAL | 0 refills | Status: DC

## 2019-04-09 MED ORDER — CYCLOBENZAPRINE HCL 10 MG PO TABS: 10.0000 mg | ORAL_TABLET | Freq: Three times a day (TID) | ORAL | 3 refills | Status: DC | PRN

## 2019-04-09 NOTE — Progress Notes (Signed)
Telephone visit  Subjective: CC: f/u back pain PCP: [name], DO [name] is a 33 y.o. male calls for telephone consult today. Patient provides verbal consent for consult held via phone.  Location of patient: parked Location of provider: [facility] Others present for call: none  1. Back pain Patient with longstanding history of low back pain.  He has a history of disc herniation.  Back pain is controlled by 3 times daily use of Flexeril and gabapentin.  He is out of refills of both of these medicines is calling for refills.  2.  Mood disorder Patient with known mood disorder.  He notes that he was previously treated with Lamictal 50 mg twice daily.  He was being seen by psychiatry for both this and ADHD and prescribed Vyvanse.  He notes that he has not been able to follow-up with them because she passed away.  He is asking for me to take over these medications.  Family history is significant for bipolar disorder as well in his mother.  Denies any intolerance to Lamictal.  No rashes.  ROS: Per HPI  Allergies  Allergen Reactions  . Ibuprofen Other (See Comments)    Makes ulcers bleed  . Influenza Vaccines Shortness Of Breath and Other (See Comments)    Rash and unable to breathe well  . Tylenol [Acetaminophen] Other (See Comments)    Bleeding ulcers  . Bee Venom Swelling    SWELLING REACTION UNSPECIFIED   . Tramadol Itching   Past Medical History:  Diagnosis Date  . ADHD (attention deficit hyperactivity disorder)   . Anal fissure   . Anxiety   . Bipolar disorder ([place])   . Chronic lower back pain   . Depression   . Esophagitis    Distal esophageal erosions consistent with mild erosive  reflux esophagitis 12/2008 by EGD   . Gastric ulcer   . GERD (gastroesophageal reflux disease)   . Hiatal hernia   . History of stomach ulcers   . Hx MRSA infection 8/08   right thigh  . Hx of colonic polyps   . Hypercholesteremia    denies  . Irritable bowel syndrome  (IBS)   . Obesity   . Occult GI bleeding 12/2008   Trivial upper GI bleed/uncontrolled GERD by upper endoscopy 12/2008, normal f/u endoscopy 03/2009 with SBCE at that time  . OSA on CPAP   . Pain management   . Pneumonia 09/01/2015   "on ATB still" (09/04/2015)  . S/P colonoscopy 11/10, 10/08   Dr [name]  . Schizophrenia [facility])   . Sleep apnea   . Thyroid function test abnormal    Noted in 2011 discharge  . Tobacco dipper      Current Outpatient Medications:  .  cyclobenzaprine (FLEXERIL) 10 MG tablet, Take 1 tablet (10 mg total) by mouth 3 (three) times daily as needed for muscle spasms., Disp: 20 tablet, Rfl: 0 .  dicyclomine (BENTYL) 10 MG capsule, Take 1 capsule (10 mg total) by mouth 4 (four) times daily -  before meals and at bedtime. (Patient not taking: Reported on 01/25/2019), Disp: 120 capsule, Rfl: 11 .  docusate sodium (COLACE) 100 MG capsule, Take 100 mg by mouth daily as needed for mild constipation. Bedtime , Disp: , Rfl:  .  EPINEPHrine 0.3 mg/0.3 mL IJ SOAJ injection, Inject 0.3 mg into the muscle as needed (allergic reactions). , Disp: , Rfl:  .  gabapentin (NEURONTIN) 300 MG capsule, TAKE 1 CAPSULE BY MOUTH THREE TIMES  DAILY, Disp: 90 capsule, Rfl: 0 .  levothyroxine (SYNTHROID, LEVOTHROID) 75 MCG tablet, TAKE 1 TABLET BY MOUTH ONCE DAILY (Patient taking differently: Take 75 mcg by mouth daily before breakfast. ), Disp: 90 tablet, Rfl: 3 .  pantoprazole (PROTONIX) 40 MG tablet, Take 1 tablet by mouth twice daily, Disp: 180 tablet, Rfl: 1  Assessment/ Plan: 33 y.o. male   1. Lumbar disc herniation I have renewed his gabapentin and Flexeril.  Symptoms are controlled with these medications. - gabapentin (NEURONTIN) 300 MG capsule; Take 1 capsule (300 mg total) by mouth 3 (three) times daily.  Dispense: 90 capsule; Refill: 3 - cyclobenzaprine (FLEXERIL) 10 MG tablet; Take 1 tablet (10 mg total) by mouth 3 (three) times daily as needed for muscle spasms.  Dispense: 90  tablet; Refill: 3  2. Bipolar disorder, in partial remission, most recent episode mixed ([place]) Lamictal prescribed.  Start 25 mg daily for 7 days then increase to 2 tablets (50 mg) daily.  He has follow-up in 1 month and we will continue to titrate this up if needed at that time.  Unsure of compliance with medication.  No previous Vyvanse prescriptions on file with [facility] for ADHD.  He will need to see me face-to-face for that medication. - lamoTRIgine (LAMICTAL) 25 MG tablet; Take 1 tablet (25 mg total) by mouth daily for 7 days, THEN 2 tablets (50 mg total) daily for 21 days.  Dispense: 60 tablet; Refill: 0   Start time: 1:27pm End time: 1:38pm  Total time spent on patient care (including telephone call/ virtual visit): 15 minutes  [place], [place] [phone]

## 2019-05-05 DIAGNOSIS — G4733 Obstructive sleep apnea (adult) (pediatric): Secondary | ICD-10-CM | POA: Diagnosis not present

## 2019-05-11 ENCOUNTER — Ambulatory Visit (INDEPENDENT_AMBULATORY_CARE_PROVIDER_SITE_OTHER): Payer: BC Managed Care – PPO | Admitting: Family Medicine

## 2019-05-11 ENCOUNTER — Other Ambulatory Visit: Payer: Self-pay

## 2019-05-11 VITALS — BP 133/87 | HR 91 | Temp 97.8°F | Ht 68.0 in | Wt >= 6400 oz

## 2019-05-11 DIAGNOSIS — E039 Hypothyroidism, unspecified: Secondary | ICD-10-CM | POA: Diagnosis not present

## 2019-05-11 DIAGNOSIS — Z79899 Other long term (current) drug therapy: Secondary | ICD-10-CM

## 2019-05-11 DIAGNOSIS — F3177 Bipolar disorder, in partial remission, most recent episode mixed: Secondary | ICD-10-CM | POA: Diagnosis not present

## 2019-05-11 DIAGNOSIS — F902 Attention-deficit hyperactivity disorder, combined type: Secondary | ICD-10-CM

## 2019-05-11 MED ORDER — LISDEXAMFETAMINE DIMESYLATE 30 MG PO CAPS: 30.0000 mg | ORAL_CAPSULE | Freq: Every day | ORAL | 0 refills | Status: DC

## 2019-05-11 MED ORDER — LAMOTRIGINE 25 MG PO TABS: 50.0000 mg | ORAL_TABLET | Freq: Every day | ORAL | 2 refills | Status: DC

## 2019-05-11 NOTE — Patient Instructions (Signed)
I have sent in Vyvanse 30 mg to your pharmacy.  You have refills for the next 3 months.  Schedule follow-up visit in 3 months for interval checkup and renewal of this medicine.  We discussed that this can cause [place] and difficulty with sleep.  Please take this only as directed.  Controlled Substance Guidelines:  1. You cannot get an early refill, even it is lost.  2. You cannot get controlled medications from any other doctor, unless it is the emergency department and related to a new problem or injury.  3. You cannot use alcohol, marijuana, cocaine or any other recreational drugs while using this medication. This is very dangerous.  4. You are willing to have your urine drug tested at each visit.  5. You will not drive while using this medication, because that can put yourself and others in serious danger of an accident. 6. If any medication is stolen, then there must be a police report to verify it, or it cannot be refilled.  7. I will not prescribe these medications for longer than 3 months.  8. You must bring your pill bottle to each visit.  9. You must use the same pharmacy for all refills for the medication, unless you clear it with me beforehand.  10. You cannot share or sell this medication.   You had labs performed today.  You will be contacted with the results of the labs once they are available, usually in the next 3 business days for routine lab work.  If you had a pap smear or biopsy performed, expect to be contacted in about 7-10 days.

## 2019-05-11 NOTE — Progress Notes (Signed)
Subjective: CC: ADHD PCP: [name], DO [name] is a 33 y.o. male presenting to clinic today for:  1.  ADHD, combined type/bipolar disorder Patient with longstanding history of ADHD, combined type.  He reports feeling fidgety and having quite a bit of difficulty with focusing.  He was previously seen by psychiatry who treated him with Vyvanse 60 mg daily.  He notes dry mouth on that dose.  He would like to start at lowest effective dose if possible.  Denies any use of alcohol, drugs.  He does use smokeless tobacco.  He is also treated with Lamictal 50 mg daily for bipolar depressive disorder and finds that this is helpful.  He is not having any unwanted side effects from the Lamictal.  2.  Hypothyroidism Patient reports compliance with Synthroid 75 mcg daily.  No change in stool pattern or difficulty swallowing.  ROS: Per HPI  Allergies  Allergen Reactions  . Ibuprofen Other (See Comments)    Makes ulcers bleed  . Influenza Vaccines Shortness Of Breath and Other (See Comments)    Rash and unable to breathe well  . Tylenol [Acetaminophen] Other (See Comments)    Bleeding ulcers  . Bee Venom Swelling    SWELLING REACTION UNSPECIFIED   . Tramadol Itching   Past Medical History:  Diagnosis Date  . ADHD (attention deficit hyperactivity disorder)   . Anal fissure   . Anxiety   . Bipolar disorder ([place])   . Chronic lower back pain   . Depression   . Esophagitis    Distal esophageal erosions consistent with mild erosive  reflux esophagitis 12/2008 by EGD   . Gastric ulcer   . GERD (gastroesophageal reflux disease)   . Hiatal hernia   . History of stomach ulcers   . Hx MRSA infection 8/08   right thigh  . Hx of colonic polyps   . Hypercholesteremia    denies  . Irritable bowel syndrome (IBS)   . Obesity   . Occult GI bleeding 12/2008   Trivial upper GI bleed/uncontrolled GERD by upper endoscopy 12/2008, normal f/u endoscopy 03/2009 with SBCE at that time   . OSA on CPAP   . Pain management   . Pneumonia 09/01/2015   "on ATB still" (09/04/2015)  . S/P colonoscopy 11/10, 10/08   Dr [name]  . Schizophrenia [facility])   . Sleep apnea   . Thyroid function test abnormal    Noted in 2011 discharge  . Tobacco dipper     Current Outpatient Medications:  .  cyclobenzaprine (FLEXERIL) 10 MG tablet, Take 1 tablet (10 mg total) by mouth 3 (three) times daily as needed for muscle spasms., Disp: 90 tablet, Rfl: 3 .  gabapentin (NEURONTIN) 300 MG capsule, Take 1 capsule (300 mg total) by mouth 3 (three) times daily., Disp: 90 capsule, Rfl: 3 .  lamoTRIgine (LAMICTAL) 25 MG tablet, Take 1 tablet (25 mg total) by mouth daily for 7 days, THEN 2 tablets (50 mg total) daily for 21 days., Disp: 60 tablet, Rfl: 0 .  levothyroxine (SYNTHROID, LEVOTHROID) 75 MCG tablet, TAKE 1 TABLET BY MOUTH ONCE DAILY (Patient taking differently: Take 75 mcg by mouth daily before breakfast. ), Disp: 90 tablet, Rfl: 3 .  pantoprazole (PROTONIX) 40 MG tablet, Take 1 tablet by mouth twice daily, Disp: 180 tablet, Rfl: 1 .  EPINEPHrine 0.3 mg/0.3 mL IJ SOAJ injection, Inject 0.3 mg into the muscle as needed (allergic reactions). , Disp: , Rfl:  Social History  Socioeconomic History  . Marital status: Married    Spouse name: Not on file  . Number of children: 2  . Years of education: Not on file  . Highest education level: Not on file  Occupational History  . Occupation: Chief Financial Officer: [employer]  . Occupation: Hydrologist  Social Needs  . Financial resource strain: Not on file  . Food insecurity    Worry: Not on file    Inability: Not on file  . Transportation needs    Medical: Not on file    Non-medical: Not on file  Tobacco Use  . Smoking status: Never Smoker  . Smokeless tobacco: Current User    Types: Chew  Substance and Sexual Activity  . Alcohol use: No    Alcohol/week: 0.0 standard drinks  . Drug use: No  . Sexual activity: Not  Currently  Lifestyle  . Physical activity    Days per week: Not on file    Minutes per session: Not on file  . Stress: Not on file  Relationships  . Social Herbalist on phone: Not on file    Gets together: Not on file    Attends religious service: Not on file    Active member of club or organization: Not on file    Attends meetings of clubs or organizations: Not on file    Relationship status: Not on file  . Intimate partner violence    Fear of current or ex partner: Not on file    Emotionally abused: Not on file    Physically abused: Not on file    Forced sexual activity: Not on file  Other Topics Concern  . Not on file  Social History Narrative  . Not on file   Family History  Problem Relation Age of Onset  . Leukemia Father 16  . Colon polyps Father   . Clotting disorder Father   . Seizures Mother   . Irritable bowel syndrome Mother   . Bipolar disorder Brother   . ADD / ADHD Brother   . Diabetes Paternal Grandmother   . Ulcerative colitis Paternal Aunt   . Colon cancer Neg Hx   . Liver cancer Neg Hx   . Esophageal cancer Neg Hx     Objective: Office vital signs reviewed. BP 133/87   Pulse 91   Temp 97.8 F (36.6 C) (Oral)   Ht _0  (1.727 m)   Wt (!) 421 lb (191 kg)   BMI 64.01 kg/m   Physical Examination:  General: Awake, alert, morbidly obese, No acute distress HEENT: Normal, no goiter or exophthalmos Cardio: regular rate and rhythm, S1S2 heard, no murmurs appreciated Pulm: clear to auscultation bilaterally, no wheezes, rhonchi or rales; normal work of breathing on room air Skin: Normal temperature. Psych: Mood stable, speech normal, affect appropriate, pleasant and interactive Neuro: No tremor  Assessment/ Plan: 33 y.o. male   1. Acquired hypothyroidism Check thyroid level.  Continues have difficulty with weight loss - TSH  2. Bipolar disorder, in partial remission, most recent episode mixed ([place]) Controlled.  Continue Lamictal 50  mg daily.  Check CMP and CBC - CBC - CMP14+EGFR - lamoTRIgine (LAMICTAL) 25 MG tablet; Take 2 tablets (50 mg total) by mouth daily.  Dispense: 60 tablet; Refill: 2  3. Attention deficit hyperactivity disorder (ADHD), combined type We will resume Vyvanse at 30 mg daily.  Previously treated with 60 mg daily.  We discussed that we may need  to titrate the medication up if he has persistent symptoms.  I reviewed the national cardiac database and there were no red flags.  Check UDS.  Controlled substance contract signed and reviewed with patient today.  Follow-up in 3 months - ToxASSURE Select 13 (MW), Urine - lisdexamfetamine (VYVANSE) 30 MG capsule; Take 1 capsule (30 mg total) by mouth daily.  Dispense: 30 capsule; Refill: 0 - lisdexamfetamine (VYVANSE) 30 MG capsule; Take 1 capsule (30 mg total) by mouth daily.  Dispense: 30 capsule; Refill: 0 - lisdexamfetamine (VYVANSE) 30 MG capsule; Take 1 capsule (30 mg total) by mouth daily.  Dispense: 30 capsule; Refill: 0  4. Controlled substance agreement signed - ToxASSURE Select 13 (MW), Urine   No orders of the defined types were placed in this encounter.  No orders of the defined types were placed in this encounter.    [name], DO [place] [phone]

## 2019-05-12 LAB — CMP14+EGFR
ALT: 24 IU/L (ref 0–44)
AST: 19 IU/L (ref 0–40)
Albumin/Globulin Ratio: 2.2 (ref 1.2–2.2)
Albumin: 4.3 g/dL (ref 4.0–5.0)
Alkaline Phosphatase: 68 IU/L (ref 39–117)
BUN/Creatinine Ratio: 9 (ref 9–20)
BUN: 9 mg/dL (ref 6–20)
Bilirubin Total: 0.3 mg/dL (ref 0.0–1.2)
CO2: 24 mmol/L (ref 20–29)
Calcium: 9.1 mg/dL (ref 8.7–10.2)
Chloride: 104 mmol/L (ref 96–106)
Creatinine, Ser: 1 mg/dL (ref 0.76–1.27)
GFR calc Af Amer: 114 mL/min/{1.73_m2} (ref >59)
GFR calc non Af Amer: 98 mL/min/{1.73_m2} (ref >59)
Globulin, Total: 2 g/dL (ref 1.5–4.5)
Glucose: 92 mg/dL (ref 65–99)
Potassium: 4.2 mmol/L (ref 3.5–5.2)
Sodium: 142 mmol/L (ref 134–144)
Total Protein: 6.3 g/dL (ref 6.0–8.5)

## 2019-05-12 LAB — CBC
Hematocrit: 44.3 % (ref 37.5–51.0)
Hemoglobin: 14.9 g/dL (ref 13.0–17.7)
MCH: 28 pg (ref 26.6–33.0)
MCHC: 33.6 g/dL (ref 31.5–35.7)
MCV: 83 fL (ref 79–97)
Platelets: 289 10*3/uL (ref 150–450)
RBC: 5.32 x10E6/uL (ref 4.14–5.80)
RDW: 14 % (ref 11.6–15.4)
WBC: 10 10*3/uL (ref 3.4–10.8)

## 2019-05-12 LAB — TSH: TSH: 2.57 u[IU]/mL (ref 0.450–4.500)

## 2019-05-14 LAB — TOXASSURE SELECT 13 (MW), URINE

## 2019-06-28 ENCOUNTER — Emergency Department (HOSPITAL_COMMUNITY)
Admission: EM | Admit: 2019-06-28 | Discharge: 2019-06-28 | Disposition: A | Discharge status: Home / Self Care | Payer: BC Managed Care – PPO | Attending: Emergency Medicine | Admitting: Emergency Medicine

## 2019-06-28 ENCOUNTER — Encounter (HOSPITAL_COMMUNITY): Payer: Self-pay | Admitting: Emergency Medicine

## 2019-06-28 ENCOUNTER — Other Ambulatory Visit: Payer: Self-pay

## 2019-06-28 DIAGNOSIS — Y929 Unspecified place or not applicable: Secondary | ICD-10-CM | POA: Insufficient documentation

## 2019-06-28 DIAGNOSIS — Y939 Activity, unspecified: Secondary | ICD-10-CM | POA: Insufficient documentation

## 2019-06-28 DIAGNOSIS — X58XXXA Exposure to other specified factors, initial encounter: Secondary | ICD-10-CM | POA: Diagnosis not present

## 2019-06-28 DIAGNOSIS — E039 Hypothyroidism, unspecified: Secondary | ICD-10-CM | POA: Insufficient documentation

## 2019-06-28 DIAGNOSIS — Z886 Allergy status to analgesic agent status: Secondary | ICD-10-CM | POA: Diagnosis not present

## 2019-06-28 DIAGNOSIS — Z91030 Bee allergy status: Secondary | ICD-10-CM | POA: Diagnosis not present

## 2019-06-28 DIAGNOSIS — Y999 Unspecified external cause status: Secondary | ICD-10-CM | POA: Insufficient documentation

## 2019-06-28 DIAGNOSIS — Z885 Allergy status to narcotic agent status: Secondary | ICD-10-CM | POA: Insufficient documentation

## 2019-06-28 DIAGNOSIS — Z79899 Other long term (current) drug therapy: Secondary | ICD-10-CM | POA: Insufficient documentation

## 2019-06-28 DIAGNOSIS — S0591XA Unspecified injury of right eye and orbit, initial encounter: Secondary | ICD-10-CM | POA: Diagnosis not present

## 2019-06-28 DIAGNOSIS — Z887 Allergy status to serum and vaccine status: Secondary | ICD-10-CM | POA: Insufficient documentation

## 2019-06-28 DIAGNOSIS — S0501XA Injury of conjunctiva and corneal abrasion without foreign body, right eye, initial encounter: Secondary | ICD-10-CM

## 2019-06-28 MED ORDER — TOBRAMYCIN 0.3 % OP SOLN
2.0000 [drp] | Freq: Once | OPHTHALMIC | Status: AC
  Administered 2019-06-28: 2 [drp] via OPHTHALMIC
  Filled 2019-06-28: qty 5

## 2019-06-28 MED ORDER — FLUORESCEIN SODIUM 1 MG OP STRP
1.0000 | ORAL_STRIP | Freq: Once | OPHTHALMIC | Status: AC
  Administered 2019-06-28: 1 via OPHTHALMIC
  Filled 2019-06-28: qty 1

## 2019-06-28 NOTE — Discharge Instructions (Signed)
Return if any problems.

## 2019-06-28 NOTE — ED Triage Notes (Signed)
Pt c/o RT eye pain and swelling that began yesterday. Pt also c/o Rt eye pain. Pt states he has a hx of an ulcer behind RT eye. Also endorses HA.

## 2019-06-29 NOTE — ED Provider Notes (Signed)
[facility] EMERGENCY DEPARTMENT Provider Note   CSN: [id number] Arrival date & time: 06/28/19  1825     History   Chief Complaint Chief Complaint  Patient presents with  . Eye Pain    HPI [name] is a 33 y.o. male.     Pt complains of redness and itching of right eye.  Pt reports he has had an ulcer in the past and wants to make sure he does not have again.    The history is provided by the patient. No language interpreter was used.  Eye Pain This is a new problem. The current episode started more than 2 days ago. The problem occurs constantly. The problem has been gradually worsening. Pertinent negatives include no shortness of breath. Nothing aggravates the symptoms. Nothing relieves the symptoms. He has tried nothing for the symptoms.    Past Medical History:  Diagnosis Date  . ADHD (attention deficit hyperactivity disorder)   . Anal fissure   . Anxiety   . Bipolar disorder ([place])   . Chronic lower back pain   . Depression   . Esophagitis    Distal esophageal erosions consistent with mild erosive  reflux esophagitis 12/2008 by EGD   . Gastric ulcer   . GERD (gastroesophageal reflux disease)   . Hiatal hernia   . History of stomach ulcers   . Hx MRSA infection 8/08   right thigh  . Hx of colonic polyps   . Hypercholesteremia    denies  . Irritable bowel syndrome (IBS)   . Obesity   . Occult GI bleeding 12/2008   Trivial upper GI bleed/uncontrolled GERD by upper endoscopy 12/2008, normal f/u endoscopy 03/2009 with SBCE at that time  . OSA on CPAP   . Pain management   . Pneumonia 09/01/2015   "on ATB still" (09/04/2015)  . S/P colonoscopy 11/10, 10/08   Dr [name]  . Schizophrenia [facility])   . Sleep apnea   . Thyroid function test abnormal    Noted in 2011 discharge  . Tobacco dipper     Patient Active Problem List   Diagnosis Date Noted  . 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 Seasonal and perennial allergic rhinitis 07/23/2014  . Abdominal  mass of other site 06/15/2012  . Abdominal cramps 06/15/2012  . Obstructive sleep apnea 05/03/2012  . Syncope and collapse 05/03/2012  . Obesity, morbid ([place]) 05/03/2012  . Anal fissure 05/27/2011  . UNSPECIFIED ANEMIA 09/11/2009  . Blood in stool 04/06/2009  . ABDOMINAL PAIN OTHER SPECIFIED SITE 03/07/2009  . BIPOLAR DISORDER UNSPECIFIED 03/06/2009  . SMOKELESS TOBACCO ABUSE 03/06/2009  . Attention deficit hyperactivity disorder (ADHD) 03/06/2009  . GERD 03/06/2009    Past Surgical History:  Procedure Laterality Date  . ANKLE FRACTURE SURGERY Left ~ 2008  . BACK SURGERY    . COLONOSCOPY  10/10/2009   anal papilla otherwise normal  . ESOPHAGOGASTRODUODENOSCOPY   04/07/2009   Normal esophagus, small hiatal hernia  . ESOPHAGOGASTRODUODENOSCOPY  01/09/2009   Distal esophageal erosions consistent with mild erosive reflux esophagitis, otherwise normal esophagus, small hiatal herniaotherwise normal stomach, D1-D2   . ESOPHAGOGASTRODUODENOSCOPY  08/26/2007   Normal esophagus, a small hiatal/hernia, otherwise normal stomach D1 through D3  . FRACTURE SURGERY    . ileocolonoscopy  08/26/2007    A normal rectum, colon, and terminal ileum  . INCISION AND DRAINAGE  09/04/2015   "reopened my back incsion"  . LUMBAR LAMINECTOMY/DECOMPRESSION MICRODISCECTOMY Left 09/23/2017   Procedure: Microdiscectomy - left - Lumbar  four-Lumbar five;  Surgeon: Earnie Larsson, MD;  Location: [id number];  Service: Neurosurgery;  Laterality: Left;  . LUMBAR MICRODISCECTOMY  08/21/2015  . LUMBAR MICRODISCECTOMY Left 09/23/2017   L4-5  . LUMBAR WOUND DEBRIDEMENT N/A 09/04/2015   Procedure: LUMBAR WOUND DEBRIDEMENT;  Surgeon: Consuella Lose, MD;  Location: [place] [facility];  Service: Neurosurgery;  Laterality: N/A;  . right side sugery     gland removed  . Small bowel capsule  04/11/2009    normal throughout  . VASECTOMY          Home Medications    Prior to Admission medications   Medication Sig Start Date End  Date Taking? Authorizing Provider  cyclobenzaprine (FLEXERIL) 10 MG tablet Take 1 tablet (10 mg total) by mouth 3 (three) times daily as needed for muscle spasms. 04/09/19  Yes Gottschalk, Leatrice Jewels M, DO  dicyclomine (BENTYL) 10 MG capsule Take 10 mg by mouth 4 (four) times daily -  before meals and at bedtime.  06/13/19  Yes [provider]  EPINEPHrine 0.3 mg/0.3 mL IJ SOAJ injection Inject 0.3 mg into the muscle as needed (allergic reactions).    Yes [provider]  gabapentin (NEURONTIN) 300 MG capsule Take 1 capsule (300 mg total) by mouth 3 (three) times daily. 04/09/19  Yes Ronnie Doss M, DO  lamoTRIgine (LAMICTAL) 25 MG tablet Take 2 tablets (50 mg total) by mouth daily. 05/11/19  Yes Gottschalk, Leatrice Jewels M, DO  levothyroxine (SYNTHROID, LEVOTHROID) 75 MCG tablet TAKE 1 TABLET BY MOUTH ONCE DAILY Patient taking differently: Take 75 mcg by mouth daily before breakfast.  01/11/19  Yes Gottschalk, Ashly M, DO  lisdexamfetamine (VYVANSE) 30 MG capsule Take 1 capsule (30 mg total) by mouth daily. 05/11/19  Yes Gottschalk, Ashly M, DO  pantoprazole (PROTONIX) 40 MG tablet Take 1 tablet by mouth twice daily Patient taking differently: Take 40 mg by mouth 2 (two) times daily.  03/25/19  Yes [name], DO    Family History Family History  Problem Relation Age of Onset  . Leukemia Father 35  . Colon polyps Father   . Clotting disorder Father   . Seizures Mother   . Irritable bowel syndrome Mother   . Bipolar disorder Brother   . ADD / ADHD Brother   . Diabetes Paternal Grandmother   . Ulcerative colitis Paternal Aunt   . Colon cancer Neg Hx   . Liver cancer Neg Hx   . Esophageal cancer Neg Hx     Social History Social History   Tobacco Use  . Smoking status: Never Smoker  . Smokeless tobacco: Current User    Types: Chew  Substance Use Topics  . Alcohol use: No    Alcohol/week: 0.0 standard drinks  . Drug use: No     Allergies   Ibuprofen, Influenza  vaccines, Tylenol [acetaminophen], Bee venom, and Tramadol   Review of Systems Review of Systems  Eyes: Positive for pain.  Respiratory: Negative for shortness of breath.   All other systems reviewed and are negative.    Physical Exam Updated Vital Signs BP 137/80   Pulse 82   Temp 98 F (36.7 C) (Oral)   Resp 16   Ht 5\' 11"  (1.803 m)   Wt (!) 170.1 kg   SpO2 100%   BMI 52.30 kg/m   Physical Exam Vitals signs and nursing note reviewed.  Constitutional:      Appearance: He is well-developed.  HENT:     Head: Normocephalic.  Eyes:  Extraocular Movements: Extraocular movements intact.     Pupils: Pupils are equal, round, and reactive to light.     Comments: fluro pinpoint area of uptake 3:00  Injected, no foreign body, slitlamp exam normal   Neck:     Musculoskeletal: Normal range of motion.  Pulmonary:     Effort: Pulmonary effort is normal.  Abdominal:     General: There is no distension.  Musculoskeletal: Normal range of motion.  Neurological:     Mental Status: He is alert and oriented to person, place, and time.      ED Treatments / Results  Labs (all labs ordered are listed, but only abnormal results are displayed) Labs Reviewed - No data to display  EKG None  Radiology No results found.  Procedures Procedures (including critical care time)  Medications Ordered in ED Medications  fluorescein ophthalmic strip 1 strip (1 strip Right Eye Given 06/28/19 2252)  tobramycin (TOBREX) 0.3 % ophthalmic solution 2 drop (2 drops Right Eye Given 06/28/19 2304)     Initial Impression / Assessment and Plan / ED Course  I have reviewed the triage vital signs and the nursing notes.  Pertinent labs & imaging results that were available during my care of the patient were reviewed by me and considered in my medical decision making (see chart for details).        MDM   Pt given tobrex.   I advised continue warm compresses.  Return if symptoms worsen.     Final Clinical Impressions(s) / ED Diagnoses   Final diagnoses:  Abrasion of right cornea, initial encounter    ED Discharge Orders    None    An After Visit Summary was printed and given to the patient.    Fransico Meadow, PA-C 06/29/19 1638    Milton Ferguson, MD 07/02/19 [phone]

## 2019-08-13 ENCOUNTER — Other Ambulatory Visit: Payer: Self-pay | Admitting: Family Medicine

## 2019-08-13 DIAGNOSIS — F3177 Bipolar disorder, in partial remission, most recent episode mixed: Secondary | ICD-10-CM

## 2019-08-17 ENCOUNTER — Ambulatory Visit: Payer: BC Managed Care – PPO | Admitting: Family Medicine

## 2019-09-06 ENCOUNTER — Emergency Department (HOSPITAL_COMMUNITY): Payer: BC Managed Care – PPO

## 2019-09-06 ENCOUNTER — Other Ambulatory Visit: Payer: Self-pay

## 2019-09-06 ENCOUNTER — Emergency Department (HOSPITAL_COMMUNITY)
Admission: EM | Admit: 2019-09-06 | Discharge: 2019-09-07 | Disposition: A | Discharge status: Home / Self Care | Payer: BC Managed Care – PPO | Attending: Emergency Medicine | Admitting: Emergency Medicine

## 2019-09-06 ENCOUNTER — Encounter (HOSPITAL_COMMUNITY): Payer: Self-pay | Admitting: Emergency Medicine

## 2019-09-06 DIAGNOSIS — R1084 Generalized abdominal pain: Secondary | ICD-10-CM

## 2019-09-06 DIAGNOSIS — R197 Diarrhea, unspecified: Secondary | ICD-10-CM | POA: Diagnosis not present

## 2019-09-06 DIAGNOSIS — R112 Nausea with vomiting, unspecified: Secondary | ICD-10-CM

## 2019-09-06 DIAGNOSIS — Z112 Encounter for screening for other bacterial diseases: Secondary | ICD-10-CM | POA: Insufficient documentation

## 2019-09-06 DIAGNOSIS — Z79899 Other long term (current) drug therapy: Secondary | ICD-10-CM | POA: Diagnosis not present

## 2019-09-06 LAB — COMPREHENSIVE METABOLIC PANEL
ALT: 30 U/L (ref 0–44)
AST: 23 U/L (ref 15–41)
Albumin: 4.3 g/dL (ref 3.5–5.0)
Alkaline Phosphatase: 59 U/L (ref 38–126)
Anion gap: 7 (ref 5–15)
BUN: 13 mg/dL (ref 6–20)
CO2: 27 mmol/L (ref 22–32)
Calcium: 9 mg/dL (ref 8.9–10.3)
Chloride: 102 mmol/L (ref 98–111)
Creatinine, Ser: 1.04 mg/dL (ref 0.61–1.24)
GFR calc Af Amer: >60 mL/min (ref >60)
GFR calc non Af Amer: >60 mL/min (ref >60)
Glucose, Bld: 88 mg/dL (ref 70–99)
Potassium: 3.8 mmol/L (ref 3.5–5.1)
Sodium: 136 mmol/L (ref 135–145)
Total Bilirubin: 0.3 mg/dL (ref 0.3–1.2)
Total Protein: 7.3 g/dL (ref 6.5–8.1)

## 2019-09-06 LAB — POC OCCULT BLOOD, ED: Fecal Occult Bld: NEGATIVE

## 2019-09-06 LAB — CBC
HCT: 49.6 % (ref 39.0–52.0)
Hemoglobin: 16 g/dL (ref 13.0–17.0)
MCH: 28.2 pg (ref 26.0–34.0)
MCHC: 32.3 g/dL (ref 30.0–36.0)
MCV: 87.5 fL (ref 80.0–100.0)
Platelets: 314 10*3/uL (ref 150–400)
RBC: 5.67 MIL/uL (ref 4.22–5.81)
RDW: 13.9 % (ref 11.5–15.5)
WBC: 11.4 10*3/uL — ABNORMAL HIGH (ref 4.0–10.5)
nRBC: 0 % (ref 0.0–0.2)

## 2019-09-06 LAB — LIPASE, BLOOD: Lipase: 28 U/L (ref 11–51)

## 2019-09-06 MED ORDER — IOHEXOL 300 MG/ML  SOLN
100.0000 mL | Freq: Once | INTRAMUSCULAR | Status: AC | PRN
  Administered 2019-09-07: 100 mL via INTRAVENOUS

## 2019-09-06 MED ORDER — PANTOPRAZOLE SODIUM 40 MG IV SOLR
40.0000 mg | Freq: Once | INTRAVENOUS | Status: AC
  Administered 2019-09-06: 40 mg via INTRAVENOUS
  Filled 2019-09-06: qty 40

## 2019-09-06 MED ORDER — SODIUM CHLORIDE 0.9 % IV BOLUS
1000.0000 mL | Freq: Once | INTRAVENOUS | Status: AC
  Administered 2019-09-06: 1000 mL via INTRAVENOUS

## 2019-09-06 MED ORDER — ONDANSETRON HCL 4 MG/2ML IJ SOLN
4.0000 mg | Freq: Once | INTRAMUSCULAR | Status: AC
  Administered 2019-09-06: 4 mg via INTRAVENOUS
  Filled 2019-09-06: qty 2

## 2019-09-06 NOTE — ED Triage Notes (Signed)
All over stomach pain x 1 week, diarrhea x 4 days, vomiting started today x 3.  Pt takes Protonix and bentyl with no relief.  Denies urinary s/s

## 2019-09-06 NOTE — ED Provider Notes (Signed)
[facility] EMERGENCY DEPARTMENT Provider Note   CSN: [id number] Arrival date & time: 09/06/19  1826     History   Chief Complaint No chief complaint on file.   HPI [name] is a 33 y.o. male.     Patient with history of morbid obesity, irritable bowel syndrome, acid reflux disease, esophagitis and gastric ulcers presenting with 1 week to 10 days of diffuse abdominal pain that is fairly constant.  He states the pain is everywhere in his abdomen and worse with eating.  Over the past 3 or 4 days he is noticed diarrhea about 5 or 6 times daily that has been not dark or bloody.  Developed vomiting today x3 that was nonbloody.  Stools have not been black or black.  States his belches felt like "sulfur".  He has been taking Protonix a bottle without relief.  Denies any missed medications.  Last endoscopy was about 10 years ago and showed esophageal erosions and a hiatal hernia.  He denies any NSAID use or use or alcohol use.  No previous abdominal surgeries.  No pain with urination or blood in the urine.  No chest pain or shortness of breath.  The history is provided by the patient.    Past Medical History:  Diagnosis Date   ADHD (attention deficit hyperactivity disorder)    Anal fissure    Anxiety    Bipolar disorder (HCC)    Chronic lower back pain    Depression    Esophagitis    Distal esophageal erosions consistent with mild erosive  reflux esophagitis 12/2008 by EGD    Gastric ulcer    GERD (gastroesophageal reflux disease)    Hiatal hernia    History of stomach ulcers    Hx MRSA infection 8/08   right thigh   Hx of colonic polyps    Hypercholesteremia    denies   Irritable bowel syndrome (IBS)    Obesity    Occult GI bleeding 12/2008   Trivial upper GI bleed/uncontrolled GERD by upper endoscopy 12/2008, normal f/u endoscopy 03/2009 with SBCE at that time   OSA on CPAP    Pain management    Pneumonia 09/01/2015   "on ATB still" (09/04/2015)     S/P colonoscopy 11/10, 10/08   Dr [name]   Schizophrenia [facility])    Sleep apnea    Thyroid function test abnormal    Noted in 2011 discharge   Tobacco dipper     Patient Active Problem List   Diagnosis Date Noted   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   Seasonal and perennial allergic rhinitis 07/23/2014   Abdominal mass of other site 06/15/2012   Abdominal cramps 06/15/2012   Obstructive sleep apnea 05/03/2012   Syncope and collapse 05/03/2012   Obesity, morbid ([place]) 05/03/2012   Anal fissure 05/27/2011   UNSPECIFIED ANEMIA 09/11/2009   Blood in stool 04/06/2009   ABDOMINAL PAIN OTHER SPECIFIED SITE 03/07/2009   BIPOLAR DISORDER UNSPECIFIED 03/06/2009   SMOKELESS TOBACCO ABUSE 03/06/2009   Attention deficit hyperactivity disorder (ADHD) 03/06/2009   GERD 03/06/2009    Past Surgical History:  Procedure Laterality Date   ANKLE FRACTURE SURGERY Left ~ 2008   BACK SURGERY     COLONOSCOPY  10/10/2009   anal papilla otherwise normal   ESOPHAGOGASTRODUODENOSCOPY   04/07/2009   Normal esophagus, small hiatal hernia   ESOPHAGOGASTRODUODENOSCOPY  01/09/2009   Distal esophageal erosions consistent with mild erosive reflux esophagitis, otherwise normal esophagus, small hiatal herniaotherwise normal stomach, D1-D2  ESOPHAGOGASTRODUODENOSCOPY  08/26/2007   Normal esophagus, a small hiatal/hernia, otherwise normal stomach D1 through D3   FRACTURE SURGERY     ileocolonoscopy  08/26/2007    A normal rectum, colon, and terminal ileum   INCISION AND DRAINAGE  09/04/2015   "reopened my back incsion"   LUMBAR LAMINECTOMY/DECOMPRESSION MICRODISCECTOMY Left 09/23/2017   Procedure: Microdiscectomy - left - Lumbar four-Lumbar five;  Surgeon: Earnie Larsson, MD;  Location: [place];  Service: Neurosurgery;  Laterality: Left;   LUMBAR MICRODISCECTOMY  08/21/2015   LUMBAR MICRODISCECTOMY Left 09/23/2017   L4-5   LUMBAR WOUND DEBRIDEMENT N/A 09/04/2015    Procedure: LUMBAR WOUND DEBRIDEMENT;  Surgeon: Consuella Lose, MD;  Location: [place] [facility];  Service: Neurosurgery;  Laterality: N/A;   right side sugery     gland removed   Small bowel capsule  04/11/2009    normal throughout   [place] Medications    Prior to Admission medications   Medication Sig Start Date End Date Taking? Authorizing Provider  cyclobenzaprine (FLEXERIL) 10 MG tablet Take 1 tablet (10 mg total) by mouth 3 (three) times daily as needed for muscle spasms. 04/09/19  Yes Gottschalk, Leatrice Jewels M, DO  dicyclomine (BENTYL) 10 MG capsule Take 10 mg by mouth 4 (four) times daily -  before meals and at bedtime.  06/13/19  Yes [provider]  EPINEPHrine 0.3 mg/0.3 mL IJ SOAJ injection Inject 0.3 mg into the muscle as needed (allergic reactions).    Yes [provider]  gabapentin (NEURONTIN) 300 MG capsule Take 1 capsule (300 mg total) by mouth 3 (three) times daily. 04/09/19  Yes Ronnie Doss M, DO  lamoTRIgine (LAMICTAL) 25 MG tablet Take 2 tablets by mouth once daily Patient taking differently: Take 25 mg by mouth 2 (two) times daily.  08/16/19  Yes Gottschalk, Leatrice Jewels M, DO  levothyroxine (SYNTHROID, LEVOTHROID) 75 MCG tablet TAKE 1 TABLET BY MOUTH ONCE DAILY Patient taking differently: Take 75 mcg by mouth daily before breakfast.  01/11/19  Yes Gottschalk, Ashly M, DO  lisdexamfetamine (VYVANSE) 30 MG capsule Take 1 capsule (30 mg total) by mouth daily. 05/11/19  Yes Gottschalk, Ashly M, DO  pantoprazole (PROTONIX) 40 MG tablet Take 1 tablet by mouth twice daily Patient taking differently: Take 40 mg by mouth 2 (two) times daily.  03/25/19  Yes [name], DO    Family History Family History  Problem Relation Age of Onset   Leukemia Father 25   Colon polyps Father    Clotting disorder Father    Seizures Mother    Irritable bowel syndrome Mother    Bipolar disorder Brother    ADD / ADHD Brother    Diabetes Paternal  Grandmother    Ulcerative colitis Paternal Aunt    Colon cancer Neg Hx    Liver cancer Neg Hx    Esophageal cancer Neg Hx     Social History Social History   Tobacco Use   Smoking status: Never Smoker   Smokeless tobacco: Current User    Types: Chew  Substance Use Topics   Alcohol use: No    Alcohol/week: 0.0 standard drinks   Drug use: No     Allergies   Ibuprofen, Influenza vaccines, Tylenol [acetaminophen], Bee venom, and Tramadol   Review of Systems Review of Systems  Constitutional: Positive for activity change and appetite change. Negative for fever.  HENT: Negative for congestion and rhinorrhea.   Eyes: Negative for visual  disturbance.  Respiratory: Negative for cough, chest tightness and shortness of breath.   Cardiovascular: Negative for chest pain.  Gastrointestinal: Positive for abdominal pain, diarrhea, nausea and vomiting.  Genitourinary: Negative for dysuria and hematuria.  Skin: Negative for rash.  Neurological: Negative for dizziness, weakness and headaches.   all other systems are negative except as noted in the HPI and PMH.     Physical Exam Updated Vital Signs BP 132/84 (BP Location: Right Arm)    Pulse 95    Temp 98.1 F (36.7 C) (Oral)    Resp 18    Ht 5\' 11"  (1.803 m)    Wt (!) 165.6 kg    SpO2 98%    BMI 50.91 kg/m   Physical Exam Vitals signs and nursing note reviewed.  Constitutional:      General: He is not in acute distress.    Appearance: He is well-developed. He is obese.  HENT:     Head: Normocephalic and atraumatic.     Mouth/Throat:     Pharynx: No oropharyngeal exudate.  Eyes:     Conjunctiva/sclera: Conjunctivae normal.     Pupils: Pupils are equal, round, and reactive to light.  Neck:     Musculoskeletal: Normal range of motion and neck supple.     Comments: No meningismus. Cardiovascular:     Rate and Rhythm: Normal rate and regular rhythm.     Heart sounds: Normal heart sounds. No murmur.  Pulmonary:      Effort: Pulmonary effort is normal. No respiratory distress.     Breath sounds: Normal breath sounds.  Abdominal:     Palpations: Abdomen is soft.     Tenderness: There is abdominal tenderness. There is no guarding or rebound.     Comments: Exam limited by obesity.  Diffuse tenderness, no guarding or rebound, worse in the upper abdomen  Genitourinary:    Comments: No gross blood, small external hemorrhoid without bleeding Musculoskeletal: Normal range of motion.        General: No tenderness.  Skin:    General: Skin is warm.  Neurological:     Mental Status: He is alert and oriented to person, place, and time.     Cranial Nerves: No cranial nerve deficit.     Motor: No abnormal muscle tone.     Coordination: Coordination normal.     Comments: No ataxia on finger to nose bilaterally. No pronator drift. 5/5 strength throughout. CN 2-12 intact.Equal grip strength. Sensation intact.   Psychiatric:        Behavior: Behavior normal.      ED Treatments / Results  Labs (all labs ordered are listed, but only abnormal results are displayed) Labs Reviewed  CBC - Abnormal; Notable for the following components:      Result Value   WBC 11.4 (*)    All other components within normal limits  LIPASE, BLOOD  COMPREHENSIVE METABOLIC PANEL  URINALYSIS, ROUTINE W REFLEX MICROSCOPIC  POC OCCULT BLOOD, ED    EKG None  Radiology Ct Abdomen Pelvis W Contrast  Result Date: 09/07/2019 CLINICAL DATA:  Generalized abdominal pain for 1 week, diarrhea for 4 days, multiple episodes of emesis EXAM: CT ABDOMEN AND PELVIS WITH CONTRAST TECHNIQUE: Multidetector CT imaging of the abdomen and pelvis was performed using the standard protocol following bolus administration of intravenous contrast. CONTRAST:  168mL OMNIPAQUE IOHEXOL 300 MG/ML  SOLN COMPARISON:  CT abdomen pelvis July 24, 2018 FINDINGS: Lower chest: Bandlike opacity in the right lung base compatible with  atelectasis. Lung bases otherwise clear.  Normal heart size. No pericardial effusion. Hepatobiliary: No focal liver abnormality is seen. Gradient density within the gallbladder likely reflecting biliary sludge. Gallbladder is under distended. No gallstones, gallbladder wall thickening, or biliary dilatation. Pancreas: Unremarkable. No pancreatic ductal dilatation or surrounding inflammatory changes. Spleen: Normal in size without focal abnormality. Small accessory splenule Adrenals/Urinary Tract: Normal adrenal glands. Simple appearing 2.7 cm fluid attenuation cyst in the lower pole left kidney. Kidneys are otherwise unremarkable, without renal calculi, suspicious lesion, or hydronephrosis. Urinary bladder is largely decompressed at the time of exam and therefore poorly evaluated by CT imaging. Stomach/Bowel: Distal esophagus, stomach and duodenal sweep are unremarkable. No small bowel wall thickening or dilatation. No evidence of obstruction. Appendix is not well visualized. No right lower quadrant/pericecal inflammatory change to suggest occult and ascites. No colonic dilatation or wall thickening. Vascular/Lymphatic: The aorta is normal caliber. No suspicious or enlarged lymph nodes in the included lymphatic chains. Reproductive: The prostate and seminal vesicles are unremarkable. Other: No abdominopelvic free fluid or free gas. No bowel containing hernias. Musculoskeletal: There are postsurgical changes in the posterior soft tissues extending to the left paraspinal musculature with mild likely postsurgical atrophy. Multilevel degenerative changes are present in the imaged portions of the spine. Features are maximal at L5-S1. IMPRESSION: Suspect biliary sludge. No calcified gallstones, features of cholecystitis or biliary dilatation. No other acute abdominal process. Electronically Signed   By: Lovena Le M.D.   On: 09/07/2019 00:27    Procedures Procedures (including critical care time)  Medications Ordered in ED Medications - No data to  display   Initial Impression / Assessment and Plan / ED Course  I have reviewed the triage vital signs and the nursing notes.  Pertinent labs & imaging results that were available during my care of the patient were reviewed by me and considered in my medical decision making (see chart for details).       Patient with history of IBS presenting with abdominal pain, vomiting and diarrhea.  Stools have been not been dark or bloody.  Abdomen soft without peritoneal signs.  Diffuse tenderness worse in the upper quadrants.  Labs are normal with stable hemoglobin.  Labs show mild leukocytosis, LFTs and lipase are normal. Hemoccult negative.  No evidence of significant GI bleed.  CT scan obtained given patient's difficult exam.  This shows nothing acute but possible biliary sludge.  Patient tolerating p.o. in the ED without vomiting.  Abdomen is soft without peritoneal signs.  Advised to continue his Bentyl and PPI.  Avoid alcohol, NSAIDs, caffeine, spicy foods.  He is agreeable to return tomorrow for ultrasound of his gallbladder.  Follow-up with his GI doctor. Return to the ED with worsening pain, fever, vomiting, or other concerns.  Final Clinical Impressions(s) / ED Diagnoses   Final diagnoses:  Generalized abdominal pain  Non-intractable vomiting with nausea, unspecified vomiting type    ED Discharge Orders         Ordered    ondansetron (ZOFRAN ODT) 4 MG disintegrating tablet  Every 8 hours PRN     09/07/19 0107    US Abdomen Limited RUQ     09/07/19 0107           Ezequiel Essex, MD 09/07/19 0128

## 2019-09-07 ENCOUNTER — Ambulatory Visit (HOSPITAL_COMMUNITY)
Admission: RE | Admit: 2019-09-07 | Discharge: 2019-09-07 | Disposition: A | Discharge status: Home / Self Care | Payer: BC Managed Care – PPO | Source: Ambulatory Visit | Attending: Emergency Medicine | Admitting: Emergency Medicine

## 2019-09-07 DIAGNOSIS — R109 Unspecified abdominal pain: Secondary | ICD-10-CM | POA: Diagnosis not present

## 2019-09-07 DIAGNOSIS — R197 Diarrhea, unspecified: Secondary | ICD-10-CM | POA: Diagnosis not present

## 2019-09-07 MED ORDER — ONDANSETRON 4 MG PO TBDP: 4.0000 mg | ORAL_TABLET | Freq: Three times a day (TID) | ORAL | 0 refills | Status: DC | PRN

## 2019-09-07 NOTE — ED Provider Notes (Signed)
Blood pressure 120/83, pulse 97, temperature 98.2 F (36.8 C), temperature source Oral, resp. rate 18, height 6' (1.829 m), weight (!) 182.8 kg, SpO2 100 %.  In short, [name] is a 33 y.o. male with a chief complaint of Cough .  Refer to the original H&P for additional details.  02:45 PM  Patient returns to the emergency department on 09/07/2023 ultrasound of the right upper quadrant.  No acute gallbladder findings as below.  Patient to follow-up as advised by EDP last night. Patient comfortable with the plan at discharge.    CLINICAL DATA:  Abdominal pain  EXAM: ULTRASOUND ABDOMEN LIMITED RIGHT UPPER QUADRANT  COMPARISON:  CT abdomen and pelvis September 06, 2019  FINDINGS: Gallbladder:  No gallstones or wall thickening visualized. There is no appreciable pericholecystic fluid or sludge. No sonographic Murphy sign noted by sonographer.  Common bile duct:  Diameter: 4 mm. No intrahepatic or extrahepatic biliary duct dilatation.  Liver:  No focal lesion identified. Liver echogenicity overall is increased. Portal vein is patent on color Doppler imaging with normal direction of blood flow towards the liver.  Other: None.  IMPRESSION: 1. Increase in liver echogenicity, a finding indicative of hepatic steatosis. No focal liver lesions evident.  2.  No demonstrable gallbladder pathology.   Electronically Signed   By: Lowella Grip III M.D.   On: 09/07/2019 14:28   Bailea Beed, W[facility], MD 09/07/19 1447

## 2019-09-07 NOTE — ED Notes (Signed)
Pt. Given water

## 2019-09-07 NOTE — Discharge Instructions (Signed)
Your testing is reassuring.  Take your stomach medication as prescribed.  Avoid alcohol, NSAIDs, caffeine, spicy foods.  Follow-up for ultrasound of your gallbladder tomorrow as discussed.  Return to the ED with worsening pain, fever, vomiting, any other concerns.

## 2019-09-10 ENCOUNTER — Other Ambulatory Visit: Payer: Self-pay | Admitting: Family Medicine

## 2019-09-10 DIAGNOSIS — M5126 Other intervertebral disc displacement, lumbar region: Secondary | ICD-10-CM

## 2019-09-17 ENCOUNTER — Other Ambulatory Visit: Payer: Self-pay | Admitting: Family Medicine

## 2019-09-17 DIAGNOSIS — F3177 Bipolar disorder, in partial remission, most recent episode mixed: Secondary | ICD-10-CM

## 2019-09-17 NOTE — Telephone Encounter (Signed)
Covering pcp  

## 2019-09-21 ENCOUNTER — Other Ambulatory Visit: Payer: Self-pay

## 2019-09-21 ENCOUNTER — Encounter: Payer: Self-pay | Admitting: Gastroenterology

## 2019-09-21 ENCOUNTER — Ambulatory Visit: Payer: BC Managed Care – PPO | Admitting: Gastroenterology

## 2019-09-21 VITALS — BP 112/70 | HR 99 | Temp 98.2°F | Ht 71.0 in | Wt >= 6400 oz

## 2019-09-21 DIAGNOSIS — R197 Diarrhea, unspecified: Secondary | ICD-10-CM

## 2019-09-21 DIAGNOSIS — R1030 Lower abdominal pain, unspecified: Secondary | ICD-10-CM

## 2019-09-21 DIAGNOSIS — K580 Irritable bowel syndrome with diarrhea: Secondary | ICD-10-CM | POA: Diagnosis not present

## 2019-09-21 DIAGNOSIS — R142 Eructation: Secondary | ICD-10-CM

## 2019-09-21 DIAGNOSIS — K2100 Gastro-esophageal reflux disease with esophagitis, without bleeding: Secondary | ICD-10-CM

## 2019-09-21 DIAGNOSIS — K760 Fatty (change of) liver, not elsewhere classified: Secondary | ICD-10-CM

## 2019-09-21 DIAGNOSIS — K449 Diaphragmatic hernia without obstruction or gangrene: Secondary | ICD-10-CM

## 2019-09-21 NOTE — Patient Instructions (Addendum)
If you are age 33 or older, your body mass index should be between 23-30. Your Body mass index is 58.72 kg/m. If this is out of the aforementioned range listed, please consider follow up with your Primary Care Provider.  If you are age 76 or younger, your body mass index should be between 19-25. Your Body mass index is 58.72 kg/m. If this is out of the aformentioned range listed, please consider follow up with your Primary Care Provider.   To help prevent the possible spread of infection to our patients, communities, and staff; we will be implementing the following measures:  As of now we are not allowing any visitors/family members to accompany you to any upcoming appointments with [facility]. If you have any concerns about this please contact our office to discuss prior to the appointment.   It has been recommended to you by your physician that you have a(n) EGD/Colonoscopy at [facility] completed.Due to unavailable procedure days, we did not schedule the procedure(s) today. Please contact our office at [phone] should you decide to have the procedure completed. You will be scheduled for a pre-visit and procedure at that time.   It was a pleasure to see you today!  Vito Cirigliano, D.O.

## 2019-09-21 NOTE — Progress Notes (Signed)
Chief Complaint: Abdominal pain, diarrhea   Referring Provider:     Ezequiel Essex, MD [facility])  GI history: 33 year old male with a history of IBS, GERD.  Last seen in the GI clinic in 06/2018.  1) IBS-D: Recurrent lower abdominal pain typically associated with nausea and diarrhea.  Can have episodes of BRBPR.  Extensive evaluation in [id number] with [facility] for abdominal pain, change in stools, hematochezia and FOBT+ stools as outlined below.  2) GERD with erosive esophagitis: Longstanding history of index symptoms of heartburn and regurgitation.  Generally controlled with Protonix 40 mg PO BID. Has breakthrough symptoms when decreasing to daily dosing.   Endoscopic history: -EGD (08/2007, Dr. Leonides Schanz): Small hiatal hernia -Colonoscopy (08/2007, Dr. Gala Romney): Normal -EGD (12/2008, Dr. Gala Romney): Distal esophageal erosions consistent with mild erosive reflux esophagitis, small hiatal hernia -EGD (03/2009, Dr. Gala Romney): Small hiatal hernia, otherwise normal -VCE (03/2009, Dr. Gala Romney): Normal -Colonoscopy (09/2009): Hypertrophied anal papilla, otherwise normal  Aside from father with colon polyps, no known family history of CRC, GI malignancy, liver disease, pancreatic disease, or IBD.   HPI:     [name] is a 33 y.o. male with a history of morbid obesity (BMI 51), IBS, GERD, anxiety, bipolar, depression, ADHD, hyperlipidemia, OSA (on CPAP), referred back to the Gastroenterology Clinic for evaluation of abdominal pain and change in bowel habits.  Was initially seen by me in 06/2018 to establish care for IBS-D and GERD.  Endorsed similar GI symptoms at that time.  Was completing a course of antibiotics at that time, with improvement in diarrhea.  Was prescribed Bentyl with consideration for colonoscopy if symptoms persist.  States symptoms had improved, and difficult to follow-up.  Today, he states he has had recurrence in his lower abdominal pain for the last  month or so. Back to having watery stools for the last 8-10 months, with occasional episodes of constipation. Bentyl helps with the abdominal pain.   Also c/o increased belching and "rotten, sulfur smell"  He was evaluated in the [facility], ER for this issue on 09/06/2019.  Exam with diffuse TTP without peritoneal signs.  Aside from WBC 11.4, normal CBC, CMP, lipase.  CT with biliary sludge, otherwise no acute intra-abdominal pathology.  Discharged with Zofran with plan for outpatient ultrasound and referral to GI.  RUQ [place] on 09/07/2019 with normal GB, CBD, increased hepatic echogenicity c/w steatosis.     Past Medical History:  Diagnosis Date  . ADHD (attention deficit hyperactivity disorder)   . Anal fissure   . Anxiety   . Bipolar disorder ([place])   . Chronic lower back pain   . Depression   . Esophagitis    Distal esophageal erosions consistent with mild erosive  reflux esophagitis 12/2008 by EGD   . Gastric ulcer   . GERD (gastroesophageal reflux disease)   . Hiatal hernia   . History of stomach ulcers   . Hx MRSA infection 8/08   right thigh  . Hx of colonic polyps   . Hypercholesteremia    denies  . Irritable bowel syndrome (IBS)   . Obesity   . Occult GI bleeding 12/2008   Trivial upper GI bleed/uncontrolled GERD by upper endoscopy 12/2008, normal f/u endoscopy 03/2009 with SBCE at that time  . OSA on CPAP   . Pain management   . Pneumonia 09/01/2015   "on ATB still" (09/04/2015)  . S/P colonoscopy 11/10, 10/08  Dr [name]  . Schizophrenia [facility])   . Sleep apnea   . Thyroid function test abnormal    Noted in 2011 discharge  . Tobacco dipper      Past Surgical History:  Procedure Laterality Date  . ANKLE FRACTURE SURGERY Left ~ 2008  . BACK SURGERY    . COLONOSCOPY  10/10/2009   anal papilla otherwise normal  . ESOPHAGOGASTRODUODENOSCOPY   04/07/2009   Normal esophagus, small hiatal hernia  . ESOPHAGOGASTRODUODENOSCOPY  01/09/2009   Distal esophageal  erosions consistent with mild erosive reflux esophagitis, otherwise normal esophagus, small hiatal herniaotherwise normal stomach, D1-D2   . ESOPHAGOGASTRODUODENOSCOPY  08/26/2007   Normal esophagus, a small hiatal/hernia, otherwise normal stomach D1 through D3  . FRACTURE SURGERY    . ileocolonoscopy  08/26/2007    A normal rectum, colon, and terminal ileum  . INCISION AND DRAINAGE  09/04/2015   "reopened my back incsion"  . LUMBAR LAMINECTOMY/DECOMPRESSION MICRODISCECTOMY Left 09/23/2017   Procedure: Microdiscectomy - left - Lumbar four-Lumbar five;  Surgeon: Earnie Larsson, MD;  Location: [place];  Service: Neurosurgery;  Laterality: Left;  . LUMBAR MICRODISCECTOMY  08/21/2015  . LUMBAR MICRODISCECTOMY Left 09/23/2017   L4-5  . LUMBAR WOUND DEBRIDEMENT N/A 09/04/2015   Procedure: LUMBAR WOUND DEBRIDEMENT;  Surgeon: Consuella Lose, MD;  Location: [place] [facility];  Service: Neurosurgery;  Laterality: N/A;  . right side sugery     gland removed  . Small bowel capsule  04/11/2009    normal throughout  . VASECTOMY     Family History  Problem Relation Age of Onset  . Leukemia Father 63  . Colon polyps Father   . Clotting disorder Father   . Seizures Mother   . Irritable bowel syndrome Mother   . Bipolar disorder Brother   . ADD / ADHD Brother   . Diabetes Paternal Grandmother   . Ulcerative colitis Paternal Aunt   . Colon cancer Neg Hx   . Liver cancer Neg Hx   . Esophageal cancer Neg Hx    Social History   Tobacco Use  . Smoking status: Never Smoker  . Smokeless tobacco: Current User    Types: Chew  Substance Use Topics  . Alcohol use: No    Alcohol/week: 0.0 standard drinks  . Drug use: No   Current Outpatient Medications  Medication Sig Dispense Refill  . cyclobenzaprine (FLEXERIL) 10 MG tablet Take 1 tablet by mouth three times daily as needed for muscle spasm 90 tablet 0  . dicyclomine (BENTYL) 10 MG capsule Take 10 mg by mouth 4 (four) times daily -  before meals and at  bedtime.     [name] Kitchen EPINEPHrine 0.3 mg/0.3 mL IJ SOAJ injection Inject 0.3 mg into the muscle as needed (allergic reactions).     . gabapentin (NEURONTIN) 300 MG capsule TAKE 1 CAPSULE BY MOUTH THREE TIMES DAILY 90 capsule 0  . lamoTRIgine (LAMICTAL) 25 MG tablet Take 1 tablet (25 mg total) by mouth 2 (two) times daily. 60 tablet 0  . levothyroxine (SYNTHROID, LEVOTHROID) 75 MCG tablet TAKE 1 TABLET BY MOUTH ONCE DAILY (Patient taking differently: Take 75 mcg by mouth daily before breakfast. ) 90 tablet 3  . lisdexamfetamine (VYVANSE) 30 MG capsule Take 1 capsule (30 mg total) by mouth daily. 30 capsule 0  . ondansetron (ZOFRAN ODT) 4 MG disintegrating tablet Take 1 tablet (4 mg total) by mouth every 8 (eight) hours as needed for nausea or vomiting. 20 tablet 0  . pantoprazole (PROTONIX) 40  MG tablet Take 1 tablet by mouth twice daily (Patient taking differently: Take 40 mg by mouth 2 (two) times daily. ) 180 tablet 1   No current facility-administered medications for this visit.    Allergies  Allergen Reactions  . Ibuprofen Other (See Comments)    Makes ulcers bleed  . Influenza Vaccines Shortness Of Breath and Other (See Comments)    Rash and unable to breathe well  . Tylenol [Acetaminophen] Other (See Comments)    Bleeding ulcers  . Bee Venom Swelling    SWELLING REACTION UNSPECIFIED   . Tramadol Itching     Review of Systems: All systems reviewed and negative except where noted in HPI.     Physical Exam:    Wt Readings from Last 3 Encounters:  09/06/19 (!) 365 lb (165.6 kg)  06/28/19 (!) 375 lb (170.1 kg)  05/11/19 (!) 421 lb (191 kg)    There were no vitals taken for this visit. Constitutional:  Pleasant, in no acute distress. Psychiatric: Normal mood and affect. Behavior is normal. EENT: Pupils normal.  Conjunctivae are normal. No scleral icterus. Neck supple. No cervical LAD. Cardiovascular: Normal rate, regular rhythm. No edema Pulmonary/chest: Effort normal and breath  sounds normal. No wheezing, rales or rhonchi. Abdominal: Soft, nondistended, nontender. Bowel sounds active throughout. There are no masses palpable. No hepatomegaly. Neurological: Alert and oriented to person place and time. Skin: Skin is warm and dry. No rashes noted.   ASSESSMENT AND PLAN;   1) Diarrhea 2) Lower abdominal pain Discussed diagnosis of IBS-D vs prolonged postinfectious diarrhea (was treated for suspected infectious enterocolitis in 06/2018) vs alternate etiology, and will evaluate and treat as below: -EGD with duodenal biopsies -Colonoscopy with random and directed biopsies -Discussed the role of TCA.  He has been on multiple SSRIs in the past.  Holding off on new neuromodulation at this juncture, but can reconsider pending above work-up -If unrevealing, can repeat stool studies -Resume Bentyl for IBS symptoms  3) GERD with erosive esophagitis 4) Hiatal hernia 5) Belching -Resume Protonix as prescribed -EGD to evaluate for erosive esophagitis, size of hernia -Briefly discussed antireflux surgical options.  Given morbid obesity, would be more prudent to consider gastric bypass  6) Morbid obesity: -Schedule endoscopic procedures to be done at [facility]  7) Fatty liver disease: -Recent RUQ [place] with fatty liver infiltration c/w hepatic steatosis.  Discussed this diagnosis today.  Liver enzymes otherwise normal. -Discussed the importance of treating underlying comorbidities (hypothyroidism) and diet/exercise. -Discussed goal for 10% body weight loss. -Discussed possible referral to Nutrition for Weight loss management clinics, but he would like to hold off at this time -As above, if considering antireflux surgery, bypass would be more beneficial  The indications, risks, and benefits of EGD and colonoscopy were explained to the patient in detail. Risks include but are not limited to bleeding, perforation, adverse reaction to medications, and cardiopulmonary compromise.  Sequelae include but are not limited to the possibility of surgery, hositalization, and mortality. The patient verbalized understanding and wished to proceed. All questions answered, referred to scheduler and bowel prep ordered. Further recommendations pending results of the exam.    Lavena Bullion, DO, FACG  09/21/2019, 8:16 AM   [name], DO

## 2019-09-27 ENCOUNTER — Other Ambulatory Visit: Payer: Self-pay | Admitting: Family Medicine

## 2019-09-27 ENCOUNTER — Telehealth: Payer: Self-pay | Admitting: Gastroenterology

## 2019-09-27 DIAGNOSIS — K219 Gastro-esophageal reflux disease without esophagitis: Secondary | ICD-10-CM

## 2019-09-27 NOTE — Telephone Encounter (Signed)
Should be scheduled for EGD/Colonoscopy.

## 2019-09-27 NOTE — Telephone Encounter (Signed)
Patient has called into the office to be scheduled at [facility] for EGD -does the patient need to be scheduled for endo/colon or just EGD? Please advise

## 2019-09-30 NOTE — Telephone Encounter (Signed)
Please add this patient to the back log list of [facility] procedures to be done Thank you

## 2019-10-01 ENCOUNTER — Other Ambulatory Visit: Payer: Self-pay

## 2019-10-01 DIAGNOSIS — K449 Diaphragmatic hernia without obstruction or gangrene: Secondary | ICD-10-CM

## 2019-10-01 DIAGNOSIS — R142 Eructation: Secondary | ICD-10-CM

## 2019-10-01 DIAGNOSIS — K580 Irritable bowel syndrome with diarrhea: Secondary | ICD-10-CM

## 2019-10-01 DIAGNOSIS — R197 Diarrhea, unspecified: Secondary | ICD-10-CM

## 2019-10-01 DIAGNOSIS — K2100 Gastro-esophageal reflux disease with esophagitis, without bleeding: Secondary | ICD-10-CM

## 2019-10-01 DIAGNOSIS — R1030 Lower abdominal pain, unspecified: Secondary | ICD-10-CM

## 2019-10-01 DIAGNOSIS — K760 Fatty (change of) liver, not elsewhere classified: Secondary | ICD-10-CM

## 2019-10-01 DIAGNOSIS — K921 Melena: Secondary | ICD-10-CM

## 2019-10-01 MED ORDER — POLYETHYLENE GLYCOL 3350 17 GM/SCOOP PO POWD: 1.0000 | Freq: Every day | ORAL | 3 refills | Status: DC

## 2019-10-01 MED ORDER — BISACODYL EC 5 MG PO TBEC: DELAYED_RELEASE_TABLET | ORAL | 0 refills | Status: DC

## 2019-10-01 NOTE — Progress Notes (Signed)
Prep instructions have been sent to patient via MyChart and mailed;

## 2019-10-01 NOTE — Telephone Encounter (Signed)
Patient has been scheduled for COVID screening on 10/30/2019 at 9:15 am; patient has also been scheduled for his EGD/colonoscopy on 11/02/2019 at 1:45pm; patient has been made aware of appt dates/times and is agreeable with plan of care; patient has been sent prep instructions via MyChart and mailed; Patient advised to call back to the office at [phone] should questions/concerns arise;  Patient verbalized understanding of information/instructions;

## 2019-10-07 ENCOUNTER — Encounter: Payer: BC Managed Care – PPO | Admitting: Gastroenterology

## 2019-10-11 ENCOUNTER — Other Ambulatory Visit: Payer: Self-pay | Admitting: Family Medicine

## 2019-10-11 DIAGNOSIS — M5126 Other intervertebral disc displacement, lumbar region: Secondary | ICD-10-CM

## 2019-10-18 ENCOUNTER — Other Ambulatory Visit: Payer: Self-pay | Admitting: Family Medicine

## 2019-10-18 DIAGNOSIS — M5126 Other intervertebral disc displacement, lumbar region: Secondary | ICD-10-CM

## 2019-10-19 ENCOUNTER — Encounter: Payer: BC Managed Care – PPO | Admitting: Gastroenterology

## 2019-10-25 ENCOUNTER — Other Ambulatory Visit: Payer: Self-pay | Admitting: Physician Assistant

## 2019-10-25 DIAGNOSIS — F3177 Bipolar disorder, in partial remission, most recent episode mixed: Secondary | ICD-10-CM

## 2019-10-26 NOTE — Telephone Encounter (Signed)
Overdue for OV.  Please make sure he schedules an appt.

## 2019-10-29 ENCOUNTER — Other Ambulatory Visit: Payer: Self-pay

## 2019-10-29 ENCOUNTER — Encounter (HOSPITAL_COMMUNITY): Payer: Self-pay

## 2019-10-30 ENCOUNTER — Other Ambulatory Visit (HOSPITAL_COMMUNITY)
Admission: RE | Admit: 2019-10-30 | Discharge: 2019-10-30 | Disposition: A | Discharge status: Home / Self Care | Payer: BC Managed Care – PPO | Source: Ambulatory Visit | Attending: Gastroenterology | Admitting: Gastroenterology

## 2019-10-31 NOTE — Progress Notes (Signed)
After speaking with the microbiology lab at [facility], they were unable to locate the pt's specimen. Pt called to return for retesting on Monday, 12/7 at 1010.

## 2019-11-01 ENCOUNTER — Other Ambulatory Visit (HOSPITAL_COMMUNITY)
Admission: RE | Admit: 2019-11-01 | Discharge: 2019-11-01 | Disposition: A | Discharge status: Home / Self Care | Payer: BC Managed Care – PPO | Source: Ambulatory Visit | Attending: Gastroenterology | Admitting: Gastroenterology

## 2019-11-01 DIAGNOSIS — Z20828 Contact with and (suspected) exposure to other viral communicable diseases: Secondary | ICD-10-CM | POA: Insufficient documentation

## 2019-11-01 DIAGNOSIS — Z01812 Encounter for preprocedural laboratory examination: Secondary | ICD-10-CM | POA: Diagnosis not present

## 2019-11-01 DIAGNOSIS — G4733 Obstructive sleep apnea (adult) (pediatric): Secondary | ICD-10-CM | POA: Diagnosis not present

## 2019-11-01 LAB — SARS CORONAVIRUS 2 (TAT 6-24 HRS): SARS Coronavirus 2: NEGATIVE

## 2019-11-01 LAB — NOVEL CORONAVIRUS, NAA (HOSP ORDER, SEND-OUT TO REF LAB; TAT 18-24 HRS): SARS-CoV-2, NAA: NOT DETECTED

## 2019-11-02 ENCOUNTER — Ambulatory Visit (HOSPITAL_COMMUNITY): Payer: BC Managed Care – PPO | Admitting: Certified Registered"

## 2019-11-02 ENCOUNTER — Encounter (HOSPITAL_COMMUNITY): Payer: Self-pay | Admitting: *Deleted

## 2019-11-02 ENCOUNTER — Encounter (HOSPITAL_COMMUNITY)
Admission: RE | Disposition: A | Discharge status: Home / Self Care | Payer: Self-pay | Source: Home / Self Care | Attending: Gastroenterology

## 2019-11-02 ENCOUNTER — Other Ambulatory Visit: Payer: Self-pay

## 2019-11-02 ENCOUNTER — Ambulatory Visit (HOSPITAL_COMMUNITY)
Admission: RE | Admit: 2019-11-02 | Discharge: 2019-11-02 | Disposition: A | Discharge status: Home / Self Care | Payer: BC Managed Care – PPO | Attending: Gastroenterology | Admitting: Gastroenterology

## 2019-11-02 DIAGNOSIS — Z86010 Personal history of colonic polyps: Secondary | ICD-10-CM | POA: Diagnosis not present

## 2019-11-02 DIAGNOSIS — K641 Second degree hemorrhoids: Secondary | ICD-10-CM

## 2019-11-02 DIAGNOSIS — K2100 Gastro-esophageal reflux disease with esophagitis, without bleeding: Secondary | ICD-10-CM | POA: Diagnosis not present

## 2019-11-02 DIAGNOSIS — K317 Polyp of stomach and duodenum: Secondary | ICD-10-CM | POA: Diagnosis not present

## 2019-11-02 DIAGNOSIS — K648 Other hemorrhoids: Secondary | ICD-10-CM | POA: Diagnosis not present

## 2019-11-02 DIAGNOSIS — K760 Fatty (change of) liver, not elsewhere classified: Secondary | ICD-10-CM

## 2019-11-02 DIAGNOSIS — F909 Attention-deficit hyperactivity disorder, unspecified type: Secondary | ICD-10-CM | POA: Insufficient documentation

## 2019-11-02 DIAGNOSIS — E039 Hypothyroidism, unspecified: Secondary | ICD-10-CM | POA: Insufficient documentation

## 2019-11-02 DIAGNOSIS — F209 Schizophrenia, unspecified: Secondary | ICD-10-CM | POA: Diagnosis not present

## 2019-11-02 DIAGNOSIS — F419 Anxiety disorder, unspecified: Secondary | ICD-10-CM | POA: Diagnosis not present

## 2019-11-02 DIAGNOSIS — R1030 Lower abdominal pain, unspecified: Secondary | ICD-10-CM

## 2019-11-02 DIAGNOSIS — K921 Melena: Secondary | ICD-10-CM

## 2019-11-02 DIAGNOSIS — F319 Bipolar disorder, unspecified: Secondary | ICD-10-CM | POA: Diagnosis not present

## 2019-11-02 DIAGNOSIS — K2950 Unspecified chronic gastritis without bleeding: Secondary | ICD-10-CM | POA: Diagnosis not present

## 2019-11-02 DIAGNOSIS — D122 Benign neoplasm of ascending colon: Secondary | ICD-10-CM | POA: Insufficient documentation

## 2019-11-02 DIAGNOSIS — E785 Hyperlipidemia, unspecified: Secondary | ICD-10-CM | POA: Diagnosis not present

## 2019-11-02 DIAGNOSIS — K449 Diaphragmatic hernia without obstruction or gangrene: Secondary | ICD-10-CM | POA: Diagnosis not present

## 2019-11-02 DIAGNOSIS — K635 Polyp of colon: Secondary | ICD-10-CM

## 2019-11-02 DIAGNOSIS — R197 Diarrhea, unspecified: Secondary | ICD-10-CM | POA: Diagnosis not present

## 2019-11-02 DIAGNOSIS — Z6841 Body Mass Index (BMI) 40.0 and over, adult: Secondary | ICD-10-CM

## 2019-11-02 DIAGNOSIS — G4733 Obstructive sleep apnea (adult) (pediatric): Secondary | ICD-10-CM | POA: Diagnosis not present

## 2019-11-02 DIAGNOSIS — R142 Eructation: Secondary | ICD-10-CM

## 2019-11-02 DIAGNOSIS — E7800 Pure hypercholesterolemia, unspecified: Secondary | ICD-10-CM | POA: Insufficient documentation

## 2019-11-02 DIAGNOSIS — K228 Other specified diseases of esophagus: Secondary | ICD-10-CM | POA: Diagnosis not present

## 2019-11-02 DIAGNOSIS — K580 Irritable bowel syndrome with diarrhea: Secondary | ICD-10-CM | POA: Diagnosis not present

## 2019-11-02 DIAGNOSIS — D649 Anemia, unspecified: Secondary | ICD-10-CM | POA: Diagnosis not present

## 2019-11-02 DIAGNOSIS — Z8711 Personal history of peptic ulcer disease: Secondary | ICD-10-CM | POA: Diagnosis not present

## 2019-11-02 DIAGNOSIS — Z8614 Personal history of Methicillin resistant Staphylococcus aureus infection: Secondary | ICD-10-CM | POA: Diagnosis not present

## 2019-11-02 HISTORY — DX: Hypothyroidism, unspecified: E03.9

## 2019-11-02 HISTORY — PX: ESOPHAGOGASTRODUODENOSCOPY (EGD) WITH PROPOFOL: SHX5813

## 2019-11-02 HISTORY — PX: BIOPSY: SHX5522

## 2019-11-02 HISTORY — DX: Presence of spectacles and contact lenses: Z97.3

## 2019-11-02 HISTORY — PX: COLONOSCOPY WITH PROPOFOL: SHX5780

## 2019-11-02 SURGERY — ESOPHAGOGASTRODUODENOSCOPY (EGD) WITH PROPOFOL
Anesthesia: Monitor Anesthesia Care

## 2019-11-02 MED ORDER — SODIUM CHLORIDE 0.9 % IV SOLN: INTRAVENOUS | Status: DC

## 2019-11-02 MED ORDER — LACTATED RINGERS IV SOLN
INTRAVENOUS | Status: DC
  Administered 2019-11-02: 12:00:00 via INTRAVENOUS

## 2019-11-02 MED ORDER — LIDOCAINE 2% (20 MG/ML) 5 ML SYRINGE
INTRAMUSCULAR | Status: DC | PRN
  Administered 2019-11-02: 40 mg via INTRAVENOUS

## 2019-11-02 MED ORDER — PROPOFOL 500 MG/50ML IV EMUL
INTRAVENOUS | Status: DC | PRN
  Administered 2019-11-02: 125 ug/kg/min via INTRAVENOUS

## 2019-11-02 MED ORDER — PROPOFOL 500 MG/50ML IV EMUL
INTRAVENOUS | Status: AC
  Filled 2019-11-02: qty 150

## 2019-11-02 SURGICAL SUPPLY — 25 items

## 2019-11-02 NOTE — Anesthesia Preprocedure Evaluation (Signed)
Anesthesia Evaluation  Patient identified by MRN, date of birth, ID band Patient awake    Reviewed: Allergy & Precautions, NPO status , Patient's Chart, lab work & pertinent test results  History of Anesthesia Complications Negative for: history of anesthetic complications  Airway Mallampati: II  TM Distance: >3 FB Neck ROM: Full    Dental  (+) Poor Dentition   Pulmonary sleep apnea and Continuous Positive Airway Pressure Ventilation ,    Pulmonary exam normal        Cardiovascular negative cardio ROS Normal cardiovascular exam     Neuro/Psych PSYCHIATRIC DISORDERS Anxiety Depression Bipolar Disorder Schizophrenia negative neurological ROS     GI/Hepatic Neg liver ROS, hiatal hernia, PUD, GERD  ,  Endo/Other  Hypothyroidism Morbid obesity  Renal/GU negative Renal ROS  negative genitourinary   Musculoskeletal negative musculoskeletal ROS (+)   Abdominal (+) + obese,   Peds  Hematology negative hematology ROS (+)   Anesthesia Other Findings   Reproductive/Obstetrics                             Anesthesia Physical Anesthesia Plan  ASA: III  Anesthesia Plan: MAC   Post-op Pain Management:    Induction: Intravenous  PONV Risk Score and Plan: 1 and Propofol infusion, TIVA and Treatment may vary due to age or medical condition  Airway Management Planned: Natural Airway, Nasal Cannula and Simple Face Mask  Additional Equipment: None  Intra-op Plan:   Post-operative Plan:   Informed Consent: I have reviewed the patients History and Physical, chart, labs and discussed the procedure including the risks, benefits and alternatives for the proposed anesthesia with the patient or authorized representative who has indicated his/her understanding and acceptance.       Plan Discussed with:   Anesthesia Plan Comments:         Anesthesia Quick Evaluation

## 2019-11-02 NOTE — H&P (Signed)
P  Chief Complaint:    Abdominal pain, diarrhea  GI History: [name] is a 33 y.o. male with a history of morbid obesity (BMI 51), IBS, GERD, anxiety, bipolar, depression, ADHD, hyperlipidemia, OSA (on CPAP, follows in the GI clinic for the following:  1) IBS-D: Recurrent lower abdominal pain typically associated with nausea and diarrhea.  Can have episodes of BRBPR.  Extensive evaluation in [id number] with [facility] for abdominal pain, change in stools, hematochezia and FOBT+ stoolsas outlined below.  2) GERD with erosive esophagitis: Longstanding history of index symptoms of heartburn and regurgitation.  Generally controlled with Protonix 40 mg PO BID. Has breakthrough symptoms when decreasing to daily dosing.  Endoscopic history: -EGD (08/2007, Dr. Leonides Schanz): Small hiatal hernia -Colonoscopy (08/2007, Dr. Gala Romney): Normal -EGD (12/2008, Dr. Gala Romney): Distal esophageal erosions consistent with mild erosive reflux esophagitis, small hiatal hernia -EGD (03/2009, Dr. Gala Romney): Small hiatal hernia, otherwise normal -VCE (03/2009, Dr. Gala Romney): Normal -Colonoscopy (09/2009): Hypertrophied anal papilla, otherwise normal  Aside from father with colon polyps, no known family history of CRC, GI malignancy, liver disease, pancreatic disease, or IBD.  HPI:     Patient is a 33 y.o. male presenting today for EGD and colonoscopy.   He has had recurrence in his lower abdominal pain for the last few months. Back to having watery stools for the last 8-10 months, with occasional episodes of constipation. Bentyl helps with the abdominal pain.   Also c/o increased belching and "rotten, sulfur smell"  He was evaluated in the [facility], ER for this issue on 09/06/2019.  Exam with diffuse TTP without peritoneal signs.  Aside from WBC 11.4, normal CBC, CMP, lipase.  CT with biliary sludge, otherwise no acute intra-abdominal pathology.  Discharged with Zofran with plan for outpatient  ultrasound and referral to GI.  RUQ [place] on 09/07/2019 with normal GB, CBD, increased hepatic echogenicity c/w steatosis.    Review of systems:     No chest pain, no SOB, no fevers, no urinary sx   Past Medical History:  Diagnosis Date   ADHD (attention deficit hyperactivity disorder)    Anal fissure    Anxiety    Bipolar disorder ([facility])    Chronic lower back pain    Depression    Esophagitis    Distal esophageal erosions consistent with mild erosive  reflux esophagitis 12/2008 by EGD    Gastric ulcer    GERD (gastroesophageal reflux disease)    Hiatal hernia    History of stomach ulcers    Hx MRSA infection 8/08   right thigh   Hx of colonic polyps    Hypercholesteremia    denies   Hypothyroidism    Irritable bowel syndrome (IBS)    Obesity    Occult GI bleeding 12/2008   Trivial upper GI bleed/uncontrolled GERD by upper endoscopy 12/2008, normal f/u endoscopy 03/2009 with SBCE at that time   OSA on CPAP    Pain management    Pneumonia 09/01/2015   "on ATB still" (09/04/2015)   S/P colonoscopy 11/10, 10/08   Dr [name]   Schizophrenia [facility])    Sleep apnea    Thyroid function test abnormal    Noted in 2011 discharge   Tobacco dipper    Wears contact lenses     Patient's surgical history, family medical history, social history, medications and allergies were all reviewed in Epic    Current Facility-Administered Medications  Medication Dose Route Frequency Provider Last Rate Last Dose  lactated ringers infusion   Intravenous Continuous Nassim Cosma V, DO 20 mL/hr at 11/02/19 1213      Physical Exam:     BP (!) 144/59    Pulse 74    Resp 12    Ht 5\' 11"  (1.803 m)    Wt (!) 165.6 kg    SpO2 99%    BMI 50.91 kg/m   GENERAL:  Pleasant male in NAD PSYCH: : Cooperative, normal affect EENT:  conjunctiva pink, mucous membranes moist, neck supple without masses CARDIAC:  RRR, no murmur heard, no peripheral edema PULM: Normal  respiratory effort, lungs CTA bilaterally, no wheezing ABDOMEN:  Nondistended, soft, nontender. No obvious masses, no hepatomegaly,  normal bowel sounds SKIN:  turgor, no lesions seen Musculoskeletal:  Normal muscle tone, normal strength NEURO: Alert and oriented x 3, no focal neurologic deficits   IMPRESSION and PLAN:    1) Diarrhea 2) Lower abdominal pain Discussed diagnosis of IBS-D vs prolonged postinfectious diarrhea (was treated for suspected infectious enterocolitis in 06/2018) vs alternate etiology, and will evaluate and treat as below: -EGD with duodenal biopsies -Colonoscopy with random and directed biopsies -Discussed the role of TCA.  He has been on multiple SSRIs in the past.  Holding off on new neuromodulation at this juncture, but can reconsider pending above work-up -If unrevealing, can repeat stool studies -Resume Bentyl for IBS symptoms  3) GERD with erosive esophagitis 4) Hiatal hernia 5) Belching -Resume Protonix as prescribed -EGD to evaluate for erosive esophagitis, size of hernia -Briefly discussed antireflux surgical options.  Given morbid obesity, would be more prudent to consider gastric bypass  6) Morbid obesity: -Schedule endoscopic procedures to be done at [facility]  7) Fatty liver disease: -Recent RUQ [place] with fatty liver infiltration c/w hepatic steatosis.  Discussed this diagnosis today.  Liver enzymes otherwise normal. -Discussed the importance of treating underlying comorbidities (hypothyroidism) and diet/exercise. -Discussed goal for 10% body weight loss. -Discussed possible referral to Nutrition for Weight loss management clinics, but he would like to hold off at this time -As above, if considering antireflux surgery, bypass would be more beneficial          Lavena Bullion ,DO, FACG 11/02/2019, 12:50 PM

## 2019-11-02 NOTE — Op Note (Signed)
[facility] Patient Name: [name] Procedure Date: 11/02/2019 MRN: [id number] Attending MD: Gerrit Heck , MD Date of Birth: 04-03-1986 CSN: [id number] Age: 33 Admit Type: Inpatient Procedure:                Upper GI endoscopy Indications:              Heartburn, Suspected esophageal reflux, Diarrhea                           Prior EGD with reflux changes in the lower                            esophagus along with LES laxity and reported small                            hiatal hernia. Last EGD was in 2010. Reflux                            symptoms generally controlled with Protonix 40 mg                            PO BID, but has breakthrough symptoms when trying                            to titrate down to daily dosing. Providers:                Gerrit Heck, MD, Jobe Igo, RN, Cherylynn Ridges, Technician Referring MD:              Medicines:                Monitored Anesthesia Care Complications:            No immediate complications. Estimated Blood Loss:     Estimated blood loss was minimal. Procedure:                Pre-Anesthesia Assessment:                           - Prior to the procedure, a History and Physical                            was performed, and patient medications and                            allergies were reviewed. The patient's tolerance of                            previous anesthesia was also reviewed. The risks                            and benefits of the procedure and the sedation  options and risks were discussed with the patient.                            All questions were answered, and informed consent                            was obtained. Prior Anticoagulants: The patient has                            taken no previous anticoagulant or antiplatelet                            agents. ASA Grade Assessment: III - A patient with   severe systemic disease. After reviewing the risks                            and benefits, the patient was deemed in                            satisfactory condition to undergo the procedure.                           After obtaining informed consent, the endoscope was                            passed under direct vision. Throughout the                            procedure, the patient's blood pressure, pulse, and                            oxygen saturations were monitored continuously. The                            [id number] Olympus gastroscope was                            introduced through the mouth, and advanced to the                            second part of duodenum. The upper GI endoscopy was                            accomplished without difficulty. The patient                            tolerated the procedure well. Scope In: Scope Out: Findings:      The Z-line was mildly irregular and was found 40 cm from the incisors.       Biopsies were taken with a cold forceps for histology. Estimated blood       loss was minimal.      The gastroesophageal flap valve was visualized endoscopically and       classified as Hill Grade II (fold present, opens with respiration).  There was mild LES laxity noted, but no appreciable hernia noted on       anterograde or retroflexed views.      The upper third of the esophagus and middle third of the esophagus were       normal.      A few small sessile polyps with no bleeding and no stigmata of recent       bleeding were found in the gastric fundus and in the gastric body.       Several of these polyps were removed with a cold biopsy forceps for       histologic representative evaluation. Resection and retrieval were       complete. Estimated blood loss was minimal.      Normal mucosa was otherwise found in the remainder of the stomach.       Biopsies were taken with a cold forceps for Helicobacter pylori testing.        Estimated blood loss was minimal.      The duodenal bulb, first portion of the duodenum and second portion of       the duodenum were normal. Biopsies for histology were taken with a cold       forceps for evaluation of celiac disease. Estimated blood loss was       minimal. Impression:               - Z-line irregular, 40 cm from the incisors.                            Biopsied.                           - Gastroesophageal flap valve classified as Hill                            Grade II (fold present, opens with respiration).                           - Normal upper third of esophagus and middle third                            of esophagus.                           - A few gastric polyps. Resected and retrieved.                           - Normal mucosa was found in the entire stomach.                            Biopsied.                           - Normal duodenal bulb, first portion of the                            duodenum and second portion of the duodenum.  Biopsied.                           - Resume current acid suppression therapy. Moderate Sedation:      Not Applicable - Patient had care per Anesthesia. Recommendation:           - Patient has a contact number available for                            emergencies. The signs and symptoms of potential                            delayed complications were discussed with the                            patient. Return to normal activities tomorrow.                            Written discharge instructions were provided to the                            patient.                           - Resume previous diet.                           - Continue present medications.                           - Await pathology results.                           - Return to GI clinic in 3 months, or sooner as                            needed.                           - Perform a colonoscopy today. Procedure Code(s):         --- Professional ---                           [phone], Esophagogastroduodenoscopy, flexible,                            transoral; with biopsy, single or multiple Diagnosis Code(s):        --- Professional ---                           K22.8, Other specified diseases of esophagus                           K31.7, Polyp of stomach and duodenum                           R12, Heartburn  R19.7, Diarrhea, unspecified CPT copyright 2019 [employer]. All rights reserved. The codes documented in this report are preliminary and upon coder review may  be revised to meet current compliance requirements. Gerrit Heck, MD 11/02/2019 1:57:56 PM Number of Addenda: 0

## 2019-11-02 NOTE — Discharge Instructions (Signed)
YOU HAD AN ENDOSCOPIC PROCEDURE TODAY: Refer to the procedure report and other information in the discharge instructions given to you for any specific questions about what was found during the examination. If this information does not answer your questions, please call [place] office at [phone] to clarify.  ° °YOU SHOULD EXPECT: Some feelings of bloating in the abdomen. Passage of more gas than usual. Walking can help get rid of the air that was put into your GI tract during the procedure and reduce the bloating. If you had a lower endoscopy (such as a colonoscopy or flexible sigmoidoscopy) you may notice spotting of blood in your stool or on the toilet paper. Some abdominal soreness may be present for a day or two, also. ° °DIET: Your first meal following the procedure should be a light meal and then it is ok to progress to your normal diet. A half-sandwich or bowl of soup is an example of a good first meal. Heavy or fried foods are harder to digest and may make you feel nauseous or bloated. Drink plenty of fluids but you should avoid alcoholic beverages for 24 hours. If you had a esophageal dilation, please see attached instructions for diet.   ° °ACTIVITY: Your care partner should take you home directly after the procedure. You should plan to take it easy, moving slowly for the rest of the day. You can resume normal activity the day after the procedure however YOU SHOULD NOT DRIVE, use power tools, machinery or perform tasks that involve climbing or major physical exertion for 24 hours (because of the sedation medicines used during the test).  ° °SYMPTOMS TO REPORT IMMEDIATELY: °A gastroenterologist can be reached at any hour. Please call [phone]  for any of the following symptoms:  °Following lower endoscopy (colonoscopy, flexible sigmoidoscopy) °Excessive amounts of blood in the stool  °Significant tenderness, worsening of abdominal pains  °Swelling of the abdomen that is new, acute  °Fever of 100° or  higher  °Following upper endoscopy (EGD, EUS, ERCP, esophageal dilation) °Vomiting of blood or coffee ground material  °New, significant abdominal pain  °New, significant chest pain or pain under the shoulder blades  °Painful or persistently difficult swallowing  °New shortness of breath  °Black, tarry-looking or red, bloody stools ° °FOLLOW UP:  °If any biopsies were taken you will be contacted by phone or by letter within the next 1-3 weeks. Call [phone]  if you have not heard about the biopsies in 3 weeks.  °Please also call with any specific questions about appointments or follow up tests. ° °

## 2019-11-02 NOTE — Anesthesia Procedure Notes (Signed)
Procedure Name: MAC Date/Time: 11/02/2019 1:12 PM Performed by: Eben Burow, CRNA Pre-anesthesia Checklist: Patient identified, Emergency Drugs available, Suction available, Patient being monitored and Timeout performed Oxygen Delivery Method: Simple face mask Dental Injury: Teeth and Oropharynx as per pre-operative assessment

## 2019-11-02 NOTE — Op Note (Signed)
[facility] Patient Name: [name] Procedure Date: 11/02/2019 MRN: [id number] Attending MD: Gerrit Heck , MD Date of Birth: [birth year] CSN: [id number] Age: 33 Admit Type: Inpatient Procedure:                Colonoscopy Indications:              Lower abdominal pain, Clinically significant                            diarrhea of unexplained origin, Change in bowel                            habits Providers:                Gerrit Heck, MD, Angus Seller Referring MD:              Medicines:                Monitored Anesthesia Care Complications:            No immediate complications. Estimated Blood Loss:     Estimated blood loss was minimal. Procedure:                Pre-Anesthesia Assessment:                           - Prior to the procedure, a History and Physical                            was performed, and patient medications and                            allergies were reviewed. The patient's tolerance of                            previous anesthesia was also reviewed. The risks                            and benefits of the procedure and the sedation                            options and risks were discussed with the patient.                            All questions were answered, and informed consent                            was obtained. Prior Anticoagulants: The patient has                            taken no previous anticoagulant or antiplatelet                            agents. ASA Grade Assessment: III - A patient with                            severe systemic  disease. After reviewing the risks                            and benefits, the patient was deemed in                            satisfactory condition to undergo the procedure.                           - Prior to the procedure, a History and Physical                            was performed, and patient medications and                            allergies were reviewed. The  patient's tolerance of                            previous anesthesia was also reviewed. The risks                            and benefits of the procedure and the sedation                            options and risks were discussed with the patient.                            All questions were answered, and informed consent                            was obtained. Prior Anticoagulants: The patient has                            taken no previous anticoagulant or antiplatelet                            agents. ASA Grade Assessment: III - A patient with                            severe systemic disease. After reviewing the risks                            and benefits, the patient was deemed in                            satisfactory condition to undergo the procedure.                           After obtaining informed consent, the colonoscope                            was passed under direct vision. Throughout the  procedure, the patient's blood pressure, pulse, and                            oxygen saturations were monitored continuously. The                            [id number]) Olympus colonoscope was                            introduced through the anus and advanced to the the                            cecum, identified by appendiceal orifice and                            ileocecal valve. The colonoscopy was performed                            without difficulty. The patient tolerated the                            procedure well. The quality of the bowel                            preparation was good. The ileocecal valve,                            appendiceal orifice, and rectum were photographed. Scope In: 1:30:26 PM Scope Out: 1:47:53 PM Scope Withdrawal Time: 0 hours 13 minutes 28 seconds  Total Procedure Duration: 0 hours 17 minutes 27 seconds  Findings:      Hemorrhoids were found on perianal exam.      A 2 mm polyp was found in the  ascending colon. The polyp was sessile.       The polyp was removed with a cold biopsy forceps. Resection and       retrieval were complete. Estimated blood loss was minimal.      Non-bleeding internal hemorrhoids were found during retroflexion. The       hemorrhoids were small and flattened with insufflation.      The mucosa was otherwise normal appearing throughout the remainder of       the colon. No areas of mucosal erythema, edema, erosions, or ulceration.       Biopsies for histology were taken with a cold forceps from the right       colon and left colon for evaluation of microscopic colitis. Estimated       blood loss was minimal. Impression:               - Hemorrhoids found on perianal exam.                           - One 2 mm polyp in the ascending colon, removed                            with a cold biopsy forceps. Resected and retrieved.                           -  Non-bleeding internal hemorrhoids.                           - Normal mucosa in the entire examined colon.                            Biopsied. Moderate Sedation:      Not Applicable - Patient had care per Anesthesia. Recommendation:           - Patient has a contact number available for                            emergencies. The signs and symptoms of potential                            delayed complications were discussed with the                            patient. Return to normal activities tomorrow.                            Written discharge instructions were provided to the                            patient.                           - Resume previous diet.                           - Continue present medications.                           - Await pathology results.                           - Repeat colonoscopy in 7-10 years for surveillance                            based on pathology results.                           - Return to GI clinic in 3 months. Procedure Code(s):        --- Professional  ---                           [phone], Colonoscopy, flexible; with biopsy, single                            or multiple Diagnosis Code(s):        --- Professional ---                           K64.8, Other hemorrhoids                           K63.5, Polyp of colon  R10.30, Lower abdominal pain, unspecified                           R19.7, Diarrhea, unspecified                           R19.4, Change in bowel habit CPT copyright 2019 American Medical Association. All rights reserved. The codes documented in this report are preliminary and upon coder review may  be revised to meet current compliance requirements. Gerrit Heck, MD 11/02/2019 2:01:57 PM Number of Addenda: 0

## 2019-11-02 NOTE — Interval H&P Note (Signed)
History and Physical Interval Note:  11/02/2019 12:53 PM  [name]  has presented today for surgery, with the diagnosis of abd pain/blood in stool/anemia/GERD.  The various methods of treatment have been discussed with the patient and family. After consideration of risks, benefits and other options for treatment, the patient has consented to  Procedure(s): ESOPHAGOGASTRODUODENOSCOPY (EGD) WITH PROPOFOL (N/A) COLONOSCOPY WITH PROPOFOL (N/A) as a surgical intervention.  The patient's history has been reviewed, patient examined, no change in status, stable for surgery.  I have reviewed the patient's chart and labs.  Questions were answered to the patient's satisfaction.     [name]

## 2019-11-02 NOTE — Transfer of Care (Signed)
Immediate Anesthesia Transfer of Care Note  Patient: [name]  Procedure(s) Performed: ESOPHAGOGASTRODUODENOSCOPY (EGD) WITH PROPOFOL (N/A ) COLONOSCOPY WITH PROPOFOL (N/A ) BIOPSY  Patient Location: PACU and Endoscopy Unit  Anesthesia Type:MAC  Level of Consciousness: awake, alert  and oriented  Airway & Oxygen Therapy: Patient Spontanous Breathing and Patient connected to face mask oxygen  Post-op Assessment: Report given to RN and Post -op Vital signs reviewed and stable  Post vital signs: Reviewed and stable  Last Vitals:  Vitals Value Taken Time  BP    Temp    Pulse 88 11/02/19 1404  Resp 16 11/02/19 1404  SpO2 100 % 11/02/19 1404  Vitals shown include unvalidated device data.  Last Pain:  Vitals:   11/02/19 1200  TempSrc: Oral  PainSc: 0-No pain         Complications: No apparent anesthesia complications

## 2019-11-03 ENCOUNTER — Other Ambulatory Visit: Payer: Self-pay

## 2019-11-03 LAB — SURGICAL PATHOLOGY

## 2019-11-03 NOTE — Anesthesia Postprocedure Evaluation (Signed)
Anesthesia Post Note  Patient: [name]  Procedure(s) Performed: ESOPHAGOGASTRODUODENOSCOPY (EGD) WITH PROPOFOL (N/A ) COLONOSCOPY WITH PROPOFOL (N/A ) BIOPSY     Patient location during evaluation: Endoscopy Anesthesia Type: MAC Level of consciousness: awake and alert Pain management: pain level controlled Vital Signs Assessment: post-procedure vital signs reviewed and stable Respiratory status: spontaneous breathing, nonlabored ventilation and respiratory function stable Cardiovascular status: blood pressure returned to baseline and stable Postop Assessment: no apparent nausea or vomiting Anesthetic complications: no    Last Vitals:  Vitals:   11/02/19 1410 11/02/19 1420  BP: 120/78 115/60  Pulse: 78 81  Resp: 10 13  Temp:    SpO2: 98% 98%    Last Pain:  Vitals:   11/02/19 1406  TempSrc: Temporal  PainSc: 0-No pain                 Lidia Collum

## 2019-11-04 ENCOUNTER — Encounter: Payer: Self-pay | Admitting: Gastroenterology

## 2019-11-05 ENCOUNTER — Encounter: Payer: Self-pay | Admitting: *Deleted

## 2019-11-13 ENCOUNTER — Other Ambulatory Visit: Payer: Self-pay | Admitting: Family Medicine

## 2019-11-13 DIAGNOSIS — M5126 Other intervertebral disc displacement, lumbar region: Secondary | ICD-10-CM

## 2019-11-22 ENCOUNTER — Encounter: Payer: Self-pay | Admitting: *Deleted

## 2019-11-23 ENCOUNTER — Ambulatory Visit: Payer: BC Managed Care – PPO | Admitting: Physician Assistant

## 2019-11-27 ENCOUNTER — Other Ambulatory Visit: Payer: Self-pay | Admitting: Family Medicine

## 2019-11-27 DIAGNOSIS — F3177 Bipolar disorder, in partial remission, most recent episode mixed: Secondary | ICD-10-CM

## 2019-11-29 NOTE — Telephone Encounter (Signed)
Appt made

## 2019-11-29 NOTE — Telephone Encounter (Signed)
Gottschalk. NTBS 30 days given 10/26/19

## 2019-11-30 ENCOUNTER — Other Ambulatory Visit: Payer: Self-pay

## 2019-12-01 ENCOUNTER — Ambulatory Visit (INDEPENDENT_AMBULATORY_CARE_PROVIDER_SITE_OTHER): Payer: 59 | Admitting: Family Medicine

## 2019-12-01 ENCOUNTER — Encounter: Payer: Self-pay | Admitting: Family Medicine

## 2019-12-01 VITALS — BP 126/75 | HR 92 | Temp 98.0°F | Ht 71.0 in | Wt >= 6400 oz

## 2019-12-01 DIAGNOSIS — K2100 Gastro-esophageal reflux disease with esophagitis, without bleeding: Secondary | ICD-10-CM

## 2019-12-01 DIAGNOSIS — F902 Attention-deficit hyperactivity disorder, combined type: Secondary | ICD-10-CM | POA: Diagnosis not present

## 2019-12-01 DIAGNOSIS — K580 Irritable bowel syndrome with diarrhea: Secondary | ICD-10-CM

## 2019-12-01 DIAGNOSIS — E039 Hypothyroidism, unspecified: Secondary | ICD-10-CM

## 2019-12-01 DIAGNOSIS — F3177 Bipolar disorder, in partial remission, most recent episode mixed: Secondary | ICD-10-CM

## 2019-12-01 DIAGNOSIS — W57XXXA Bitten or stung by nonvenomous insect and other nonvenomous arthropods, initial encounter: Secondary | ICD-10-CM

## 2019-12-01 MED ORDER — LAMOTRIGINE 25 MG PO TABS: 25.0000 mg | ORAL_TABLET | Freq: Two times a day (BID) | ORAL | 5 refills | Status: DC

## 2019-12-01 MED ORDER — LISDEXAMFETAMINE DIMESYLATE 30 MG PO CAPS: 30.0000 mg | ORAL_CAPSULE | Freq: Every day | ORAL | 0 refills | Status: DC

## 2019-12-01 NOTE — Patient Instructions (Signed)
We will reach out to your gastric doctor about the medication we discussed.  We will check your thyroid level to see if perhaps this may be playing a part in your diarrhea  Medications have been sent.  Follow-up with me in 3 months

## 2019-12-01 NOTE — Progress Notes (Signed)
Subjective: CC: f/u thyroid PCP: [name], DO ZOX:[id number] [name] is a 33 y.o. male presenting to clinic today for:  1.  ADHD, combined type/bipolar disorder/IBS-D Patient with longstanding history of ADHD, combined type.    Symptoms of inattention and fidgeting have gotten quite a bit better though are still present mildly.  He reports compliance with Vyvanse and thinks that it is working well for him.  Denies any constipation, increased anxiety, heart palpitations.he does have problem with appetite and early satiety.  This has been worked up by GI.  No etiology found.  He has known IBS-D and is prescribed Bentyl for this.  Unsure if he is ever been on Xifaxan for chronic diarrhea, which she notes happens every day.  He has never been tested for red meat allergy but notes that he has sustained several tick bites in the past.    Denies any use of alcohol, drugs.  He does use smokeless tobacco.  He is also treated with Lamictal 50 mg daily for bipolar depressive disorder and as this is working well for him.  Denies any rashes or intolerance to the Lamictal  2.  Hypothyroidism Patient reports compliance with Synthroid 75 mcg daily.  He has chronic diarrhea that was worse over the fall.  He has been worked up with gastroenterology as above.  No unplanned weight loss.  ROS: Per HPI  Allergies  Allergen Reactions   Ibuprofen Other (See Comments)    Makes ulcers bleed   Influenza Vaccines Shortness Of Breath and Other (See Comments)    Rash and unable to breathe well   Tylenol [Acetaminophen] Other (See Comments)    Bleeding ulcers   Bee Venom Swelling    SWELLING REACTION UNSPECIFIED    Tramadol Itching   Past Medical History:  Diagnosis Date   ADHD (attention deficit hyperactivity disorder)    Anal fissure    Anxiety    Bipolar disorder (HCC)    Chronic lower back pain    Depression    Esophagitis    Distal esophageal erosions consistent with mild  erosive  reflux esophagitis 12/2008 by EGD    External hemorrhoids    Gastric polyps    Gastric ulcer    GERD (gastroesophageal reflux disease)    Hepatic steatosis    Hiatal hernia    History of stomach ulcers    Hx MRSA infection 8/08   right thigh   Hx of colonic polyps    Hypercholesteremia    denies   Hypothyroidism    Internal hemorrhoids    Irritable bowel syndrome (IBS)    Obesity    Occult GI bleeding 12/2008   Trivial upper GI bleed/uncontrolled GERD by upper endoscopy 12/2008, normal f/u endoscopy 03/2009 with SBCE at that time   OSA on CPAP    Pain management    Pneumonia 09/01/2015   "on ATB still" (09/04/2015)   S/P colonoscopy 11/10, 10/08   Dr [name]   Schizophrenia [facility])    Sleep apnea    Thyroid function test abnormal    Noted in 2011 discharge   Tobacco dipper    Wears contact lenses     Current Outpatient Medications:    bisacodyl (BISACODYL) 5 MG EC tablet, Please follow prep instructions for this medication, Disp: 4 tablet, Rfl: 0   cyclobenzaprine (FLEXERIL) 10 MG tablet, Take 1 tablet (10 mg total) by mouth 3 (three) times daily as needed. for muscle spasms. Make appointment with PCP, Disp: 90 tablet, Rfl:  0   dicyclomine (BENTYL) 10 MG capsule, Take 10 mg by mouth 3 (three) times daily before meals. , Disp: , Rfl:    EPINEPHrine 0.3 mg/0.3 mL IJ SOAJ injection, Inject 0.3 mg into the muscle as needed (allergic reactions). , Disp: , Rfl:    gabapentin (NEURONTIN) 300 MG capsule, Take 1 capsule (300 mg total) by mouth 3 (three) times daily. Make appointment with PCP, Disp: 90 capsule, Rfl: 0   lamoTRIgine (LAMICTAL) 25 MG tablet, Take 1 tablet (25 mg total) by mouth 2 (two) times daily. NOV, Disp: 60 tablet, Rfl: 0   levothyroxine (SYNTHROID, LEVOTHROID) 75 MCG tablet, TAKE 1 TABLET BY MOUTH ONCE DAILY (Patient taking differently: Take 75 mcg by mouth daily before breakfast. ), Disp: 90 tablet, Rfl: 3   lisdexamfetamine  (VYVANSE) 30 MG capsule, Take 1 capsule (30 mg total) by mouth daily., Disp: 30 capsule, Rfl: 0   ondansetron (ZOFRAN ODT) 4 MG disintegrating tablet, Take 1 tablet (4 mg total) by mouth every 8 (eight) hours as needed for nausea or vomiting., Disp: 20 tablet, Rfl: 0   pantoprazole (PROTONIX) 40 MG tablet, Take 1 tablet by mouth twice daily, Disp: 180 tablet, Rfl: 0   polyethylene glycol powder (GLYCOLAX/MIRALAX) 17 GM/SCOOP powder, Take 255 g by mouth daily. Please follow the prep instructions for this medication, Disp: 255 g, Rfl: 3 Social History   Socioeconomic History   Marital status: Married    Spouse name: Not on file   Number of children: 2   Years of education: Not on file   Highest education level: Not on file  Occupational History   Occupation: VOLUNTEER FIREMAN    Employer: [employer]   Occupation: Hydrologist  Tobacco Use   Smoking status: Never Smoker   Smokeless tobacco: Current User    Types: Chew  Substance and Sexual Activity   Alcohol use: Not Currently    Alcohol/week: 0.0 standard drinks    Comment: rare   Drug use: No   Sexual activity: Not Currently  Other Topics Concern   Not on file  Social History Narrative   Not on file   Social Determinants of Health   Financial Resource Strain:    Difficulty of Paying Living Expenses: Not on file  Food Insecurity:    Worried About Charity fundraiser in the Last Year: Not on file   [employer] of Food in the Last Year: Not on file  Transportation Needs:    Lack of Transportation (Medical): Not on file   Lack of Transportation (Non-Medical): Not on file  Physical Activity:    Days of Exercise per Week: Not on file   Minutes of Exercise per Session: Not on file  Stress:    Feeling of Stress : Not on file  Social Connections:    Frequency of Communication with Friends and Family: Not on file   Frequency of Social Gatherings with Friends and Family: Not on file   Attends  Religious Services: Not on file   Active Member of Clubs or Organizations: Not on file   Attends Archivist Meetings: Not on file   Marital Status: Not on file  Intimate Partner Violence:    Fear of Current or Ex-Partner: Not on file   Emotionally Abused: Not on file   Physically Abused: Not on file   Sexually Abused: Not on file   Family History  Problem Relation Age of Onset   Leukemia Father 58   Colon polyps Father  Clotting disorder Father    Seizures Mother    Irritable bowel syndrome Mother    Bipolar disorder Brother    ADD / ADHD Brother    Diabetes Paternal Grandmother    Ulcerative colitis Paternal Aunt    Colon cancer Neg Hx    Liver cancer Neg Hx    Esophageal cancer Neg Hx     Objective: Office vital signs reviewed. BP 126/75    Pulse 92    Temp 98 F (36.7 C) (Temporal)    Ht '5\' 11"'  (1.803 m)    Wt (!) 414 lb (187.8 kg)    SpO2 99%    BMI 57.74 kg/m   Physical Examination:  General: Awake, alert, morbidly obese, No acute distress HEENT: Normal, no goiter or exophthalmos; no thyromegaly Cardio: regular rate and rhythm, S1S2 heard, no murmurs appreciated Pulm: clear to auscultation bilaterally, no wheezes, rhonchi or rales; normal work of breathing on room air Skin: Normal temperature.  He has cracked fingers at the tips.  No active bleeding or evidence of secondary infection Psych: Mood stable, speech normal, affect appropriate, pleasant and interactive Neuro: No tremor  Depression screen [facility] 2/9 12/01/2019 05/11/2019 01/25/2019  Decreased Interest 0 0 0  Down, Depressed, Hopeless 0 0 0  PHQ - 2 Score 0 0 0  Altered sleeping 0 0 -  Tired, decreased energy 0 0 -  Change in appetite 0 0 -  Feeling bad or failure about yourself  0 0 -  Trouble concentrating 0 0 -  Moving slowly or fidgety/restless 0 0 -  Suicidal thoughts 0 0 -  PHQ-9 Score 0 0 -  Difficult doing work/chores - - -  Some recent data might be hidden     Assessment/ Plan: 34 y.o. male   1. Acquired hypothyroidism Given ongoing diarrhea, check thyroid panel.  We discussed that this may be playing a part if he is overtreated with thyroid medication.  Will contact with results once available - CMP14+EGFR - Thyroid Panel With TSH  2. Bipolar disorder, in partial remission, most recent episode mixed (HCC) Stable.  Lamictal renewed - CMP14+EGFR - lamoTRIgine (LAMICTAL) 25 MG tablet; Take 1 tablet (25 mg total) by mouth 2 (two) times daily.  Dispense: 60 tablet; Refill: 5  3. Attention deficit hyperactivity disorder (ADHD), combined type Stable.  Vyvanse renewed - lisdexamfetamine (VYVANSE) 30 MG capsule; Take 1 capsule (30 mg total) by mouth daily.  Dispense: 30 capsule; Refill: 0 - lisdexamfetamine (VYVANSE) 30 MG capsule; Take 1 capsule (30 mg total) by mouth daily.  Dispense: 30 capsule; Refill: 0 - lisdexamfetamine (VYVANSE) 30 MG capsule; Take 1 capsule (30 mg total) by mouth daily.  Dispense: 30 capsule; Refill: 0  4. Morbid obesity ([place]) Working on lifestyle modification with seven pound weight loss since last visit  5. Irritable bowel syndrome with diarrhea We will check for red meat allergy.  May be a good candidate for Xifaxan and I will reach out to his gastroenterologist for his thoughts on this.  Also wonder if he would be a good candidate for Viberzi - red meat allergy  6. Gastroesophageal reflux disease with esophagitis without hemorrhage Somewhat controlled with PPI though still has foul belching  7. Tick bite, initial encounter - red meat allergy   No orders of the defined types were placed in this encounter.  No orders of the defined types were placed in this encounter.    [name], DO [place] [phone]

## 2019-12-02 ENCOUNTER — Other Ambulatory Visit: Payer: Self-pay | Admitting: Family Medicine

## 2019-12-02 LAB — CMP14+EGFR
ALT: 28 IU/L (ref 0–44)
AST: 21 IU/L (ref 0–40)
Albumin/Globulin Ratio: 1.7 (ref 1.2–2.2)
Albumin: 4.1 g/dL (ref 4.0–5.0)
Alkaline Phosphatase: 75 IU/L (ref 39–117)
BUN/Creatinine Ratio: 7 — ABNORMAL LOW (ref 9–20)
BUN: 7 mg/dL (ref 6–20)
Bilirubin Total: 0.4 mg/dL (ref 0.0–1.2)
CO2: 25 mmol/L (ref 20–29)
Calcium: 9.3 mg/dL (ref 8.7–10.2)
Chloride: 103 mmol/L (ref 96–106)
Creatinine, Ser: 1.02 mg/dL (ref 0.76–1.27)
GFR calc Af Amer: 110 mL/min/{1.73_m2} (ref >59)
GFR calc non Af Amer: 95 mL/min/{1.73_m2} (ref >59)
Globulin, Total: 2.4 g/dL (ref 1.5–4.5)
Glucose: 91 mg/dL (ref 65–99)
Potassium: 4.3 mmol/L (ref 3.5–5.2)
Sodium: 140 mmol/L (ref 134–144)
Total Protein: 6.5 g/dL (ref 6.0–8.5)

## 2019-12-02 LAB — THYROID PANEL WITH TSH
Free Thyroxine Index: 1.7 (ref 1.2–4.9)
T3 Uptake Ratio: 30 % (ref 24–39)
T4, Total: 5.8 ug/dL (ref 4.5–12.0)
TSH: 2.11 u[IU]/mL (ref 0.450–4.500)

## 2019-12-02 MED ORDER — LEVOTHYROXINE SODIUM 75 MCG PO TABS: 75.0000 ug | ORAL_TABLET | Freq: Every day | ORAL | 3 refills | Status: DC

## 2019-12-06 LAB — ALPHA-GAL PANEL
Alpha Gal IgE*: 0.21 kU/L — ABNORMAL HIGH (ref <0.10)
Beef (Bos spp) IgE: 0.13 kU/L (ref <0.35)
Class Interpretation: 0
Class Interpretation: 0
Lamb/Mutton (Ovis spp) IgE: <0.1 kU/L (ref <0.35)
Pork (Sus spp) IgE: <0.1 kU/L (ref <0.35)

## 2019-12-07 ENCOUNTER — Other Ambulatory Visit: Payer: Self-pay | Admitting: Family Medicine

## 2019-12-07 DIAGNOSIS — K580 Irritable bowel syndrome with diarrhea: Secondary | ICD-10-CM

## 2019-12-07 MED ORDER — RIFAXIMIN 550 MG PO TABS: 550.0000 mg | ORAL_TABLET | Freq: Three times a day (TID) | ORAL | 0 refills | Status: AC

## 2019-12-14 ENCOUNTER — Other Ambulatory Visit: Payer: Self-pay | Admitting: Family Medicine

## 2019-12-14 ENCOUNTER — Other Ambulatory Visit: Payer: Self-pay | Admitting: Gastroenterology

## 2019-12-14 ENCOUNTER — Other Ambulatory Visit: Payer: Self-pay | Admitting: Physician Assistant

## 2019-12-14 DIAGNOSIS — M5126 Other intervertebral disc displacement, lumbar region: Secondary | ICD-10-CM

## 2019-12-14 DIAGNOSIS — K219 Gastro-esophageal reflux disease without esophagitis: Secondary | ICD-10-CM

## 2020-01-06 ENCOUNTER — Telehealth: Payer: Self-pay | Admitting: Family Medicine

## 2020-01-06 NOTE — Telephone Encounter (Signed)
Patient thinks you gave him an antibiotic and he requests refills.  I told him lamotrigine has refills and he says that it was not that cause you gave him an antibiotic!!!  He prefers you call him when you get back to work.

## 2020-01-10 NOTE — Telephone Encounter (Signed)
Patient aware and verbalizes understanding. 

## 2020-01-10 NOTE — Telephone Encounter (Signed)
Spoke to patient last week and told him I would inquire about repeating the Xifaxan.  He has had a 2-week course and that is the recommended course/ treatment.  Would recommend following up with his gastroenterologist if ongoing issues.

## 2020-01-24 ENCOUNTER — Encounter: Payer: Self-pay | Admitting: Nurse Practitioner

## 2020-01-24 ENCOUNTER — Ambulatory Visit (INDEPENDENT_AMBULATORY_CARE_PROVIDER_SITE_OTHER): Payer: 59 | Admitting: Nurse Practitioner

## 2020-01-24 ENCOUNTER — Other Ambulatory Visit: Payer: Self-pay

## 2020-01-24 DIAGNOSIS — Z024 Encounter for examination for driving license: Secondary | ICD-10-CM

## 2020-01-24 LAB — URINALYSIS
Bilirubin, UA: NEGATIVE
Glucose, UA: NEGATIVE
Ketones, UA: NEGATIVE
Leukocytes,UA: NEGATIVE
Nitrite, UA: NEGATIVE
Protein,UA: NEGATIVE
Specific Gravity, UA: 1.025 (ref 1.005–1.030)
Urobilinogen, Ur: 0.2 mg/dL (ref 0.2–1.0)
pH, UA: 5.5 (ref 5.0–7.5)

## 2020-01-24 NOTE — Progress Notes (Signed)
Patient ID: [name], male   DOB: December 20, 1985, 34 y.o.   MRN: [id number]  Private DOT- see scanned  In document

## 2020-02-12 ENCOUNTER — Other Ambulatory Visit: Payer: Self-pay | Admitting: Gastroenterology

## 2020-02-19 ENCOUNTER — Other Ambulatory Visit: Payer: Self-pay | Admitting: Gastroenterology

## 2020-02-21 NOTE — Telephone Encounter (Signed)
[facility] patient Last office visit 01/24/2020 Last refill 12/15/2019, #120, no refills

## 2020-02-29 NOTE — Progress Notes (Signed)
Subjective: CC: f/u thyroid PCP: [name], DO [id number] [name] is a 34 y.o. male presenting to clinic today for:  1.  ADHD, combined type/bipolar disorder  Patient with longstanding history of ADHD, combined type.    Symptoms of inattention and fidgeting have gotten worse since her last visit because he was unable to pick up his Vyvanse.  His insurance no longer covers this.  He is here to see if we can switch him to something else.  He is never been on anything else.  He continues to have intermittent alternating diarrhea and constipation.  He tried reaching out to his GI doctor but unfortunately has not been able to get an appointment.  He notes some moodiness.  He would like to go up on his Lamictal.  Currently taking 25 mg twice daily.  2.  Hypothyroidism Patient reports compliance with Synthroid 75 mcg daily.  Chronic alternating diarrhea and constipation as above.  Reports some fidgeting and moodiness  3.  Family history of hemochromatosis Patient reports that his brother was recently diagnosed with hemochromatosis in jail.  He also was diagnosed hepatitis C.  The jail physician stressed that the patient's family should be screened for hemochromatosis.  ROS: Per HPI  Allergies  Allergen Reactions  . Ibuprofen Other (See Comments)    Makes ulcers bleed  . Influenza Vaccines Shortness Of Breath and Other (See Comments)    Rash and unable to breathe well  . Tylenol [Acetaminophen] Other (See Comments)    Bleeding ulcers  . Bee Venom Swelling    SWELLING REACTION UNSPECIFIED   . Tramadol Itching   Past Medical History:  Diagnosis Date  . ADHD (attention deficit hyperactivity disorder)   . Anal fissure   . Anxiety   . Bipolar disorder ([place])   . Chronic lower back pain   . Depression   . Esophagitis    Distal esophageal erosions consistent with mild erosive  reflux esophagitis 12/2008 by EGD   . External hemorrhoids   . Gastric polyps   . Gastric ulcer    . GERD (gastroesophageal reflux disease)   . Hepatic steatosis   . Hiatal hernia   . History of stomach ulcers   . Hx MRSA infection 8/08   right thigh  . Hx of colonic polyps   . Hypercholesteremia    denies  . Hypothyroidism   . Internal hemorrhoids   . Irritable bowel syndrome (IBS)   . Obesity   . Occult GI bleeding 12/2008   Trivial upper GI bleed/uncontrolled GERD by upper endoscopy 12/2008, normal f/u endoscopy 03/2009 with SBCE at that time  . OSA on CPAP   . Pain management   . Pneumonia 09/01/2015   "on ATB still" (09/04/2015)  . S/P colonoscopy 11/10, 10/08   Dr [name]  . Schizophrenia [facility])   . Sleep apnea   . Thyroid function test abnormal    Noted in 2011 discharge  . Tobacco dipper   . Wears contact lenses     Current Outpatient Medications:  .  dicyclomine (BENTYL) 10 MG capsule, TAKE 1 CAPSULE BY MOUTH 4 TIMES DAILY BEFORE MEAL(S) AND AT BEDTIME, Disp: 120 capsule, Rfl: 0 .  cyclobenzaprine (FLEXERIL) 10 MG tablet, Take 1 tablet (10 mg total) by mouth 3 (three) times daily as needed for muscle spasms., Disp: 90 tablet, Rfl: 2 .  gabapentin (NEURONTIN) 300 MG capsule, Take 1 capsule (300 mg total) by mouth 3 (three) times daily., Disp: 90 capsule, Rfl: 5 .  lamoTRIgine (LAMICTAL) 25 MG tablet, Take 1 tablet (25 mg total) by mouth 2 (two) times daily., Disp: 60 tablet, Rfl: 5 .  levothyroxine (SYNTHROID) 75 MCG tablet, Take 1 tablet (75 mcg total) by mouth daily before breakfast., Disp: 90 tablet, Rfl: 3 .  lisdexamfetamine (VYVANSE) 30 MG capsule, Take 1 capsule (30 mg total) by mouth daily., Disp: 30 capsule, Rfl: 0 .  lisdexamfetamine (VYVANSE) 30 MG capsule, Take 1 capsule (30 mg total) by mouth daily., Disp: 30 capsule, Rfl: 0 .  lisdexamfetamine (VYVANSE) 30 MG capsule, Take 1 capsule (30 mg total) by mouth daily., Disp: 30 capsule, Rfl: 0 .  ondansetron (ZOFRAN ODT) 4 MG disintegrating tablet, Take 1 tablet (4 mg total) by mouth every 8 (eight) hours  as needed for nausea or vomiting., Disp: 20 tablet, Rfl: 0 .  pantoprazole (PROTONIX) 40 MG tablet, Take 1 tablet by mouth twice daily, Disp: 180 tablet, Rfl: 1 Social History   Socioeconomic History  . Marital status: Married    Spouse name: Not on file  . Number of children: 2  . Years of education: Not on file  . Highest education level: Not on file  Occupational History  . Occupation: Chief Financial Officer: [employer]  . Occupation: Hydrologist  Tobacco Use  . Smoking status: Never Smoker  . Smokeless tobacco: Current User    Types: Snuff  Substance and Sexual Activity  . Alcohol use: Not Currently    Alcohol/week: 0.0 standard drinks    Comment: rare  . Drug use: No  . Sexual activity: Not Currently  Other Topics Concern  . Not on file  Social History Narrative  . Not on file   Social Determinants of Health   Financial Resource Strain:   . Difficulty of Paying Living Expenses:   Food Insecurity:   . Worried About Charity fundraiser in the Last Year:   . Arboriculturist in the Last Year:   Transportation Needs:   . Film/video editor (Medical):   [name] Kitchen Lack of Transportation (Non-Medical):   Physical Activity:   . Days of Exercise per Week:   . Minutes of Exercise per Session:   Stress:   . Feeling of Stress :   Social Connections:   . Frequency of Communication with Friends and Family:   . Frequency of Social Gatherings with Friends and Family:   . Attends Religious Services:   . Active Member of Clubs or Organizations:   . Attends Archivist Meetings:   [name] Kitchen Marital Status:   Intimate Partner Violence:   . Fear of Current or Ex-Partner:   . Emotionally Abused:   [name] Kitchen Physically Abused:   . Sexually Abused:    Family History  Problem Relation Age of Onset  . Leukemia Father 55  . Colon polyps Father   . Clotting disorder Father   . Seizures Mother   . Irritable bowel syndrome Mother   . Bipolar disorder Brother   . ADD / ADHD  Brother   . Diabetes Paternal Grandmother   . Ulcerative colitis Paternal Aunt   . Colon cancer Neg Hx   . Liver cancer Neg Hx   . Esophageal cancer Neg Hx     Objective: Office vital signs reviewed. BP 124/79   Pulse (!) 48   Temp 97.8 F (36.6 C) (Temporal)   Ht 5\' 11"  (1.803 m)   Wt (!) 407 lb (184.6 kg)   SpO2 96%  BMI 56.76 kg/m   Physical Examination:  General: Awake, alert, morbidly obese, No acute distress HEENT: Normal, no goiter or exophthalmos  Cardio: regular rate and rhythm (did not appreciate bradycardia on exam, HR 68, S1S2 heard, no murmurs appreciated Pulm: clear to auscultation bilaterally, no wheezes, rhonchi or rales; normal work of breathing on room air Skin: Normal temperature.  Psych: Mood stable, speech normal, affect appropriate, pleasant and interactive; seems restless Neuro: No tremor  Depression screen [facility] 2/9 03/01/2020 12/01/2019 05/11/2019  Decreased Interest 0 0 0  Down, Depressed, Hopeless 0 0 0  PHQ - 2 Score 0 0 0  Altered sleeping 0 0 0  Tired, decreased energy 0 0 0  Change in appetite 0 0 0  Feeling bad or failure about yourself  0 0 0  Trouble concentrating 0 0 0  Moving slowly or fidgety/restless 0 0 0  Suicidal thoughts 0 0 0  PHQ-9 Score 0 0 0  Difficult doing work/chores - - -  Some recent data might be hidden    Assessment/ Plan: 34 y.o. male   1. Attention deficit hyperactivity disorder (ADHD), combined type Switch to Adderall XR 20 mg.  Follow-up in 4 weeks. The Narcotic Database has been reviewed.  There were no red flags.   - ToxASSURE Select 13 (MW), Urine - amphetamine-dextroamphetamine (ADDERALL XR) 20 MG 24 hr capsule; Take 1 capsule (20 mg total) by mouth in the morning.  Dispense: 30 capsule; Refill: 0  2. Controlled substance agreement signed  3. Acquired hypothyroidism - Thyroid Panel With TSH  4. Encounter for medication monitoring - CBC with Differential  5. Family history of hemochromatosis -  Hemochromatosis DNA-PCR(c282y,h63d) - Transferrin - Iron, TIBC and Ferritin Panel  6. Bipolar disorder, in partial remission, most recent episode mixed (HCC) - lamoTRIgine (LAMICTAL) 25 MG tablet; Take 2 tablets (50 mg total) by mouth 2 (two) times daily.  Dispense: 120 tablet; Refill: 0   No orders of the defined types were placed in this encounter.  No orders of the defined types were placed in this encounter.    [name], DO [place] [phone]

## 2020-03-01 ENCOUNTER — Other Ambulatory Visit: Payer: Self-pay

## 2020-03-01 ENCOUNTER — Telehealth: Payer: Self-pay | Admitting: Internal Medicine

## 2020-03-01 ENCOUNTER — Ambulatory Visit (INDEPENDENT_AMBULATORY_CARE_PROVIDER_SITE_OTHER): Payer: 59 | Admitting: Family Medicine

## 2020-03-01 ENCOUNTER — Encounter: Payer: Self-pay | Admitting: Family Medicine

## 2020-03-01 VITALS — BP 124/79 | HR 48 | Temp 97.8°F | Ht 71.0 in | Wt >= 6400 oz

## 2020-03-01 DIAGNOSIS — Z8349 Family history of other endocrine, nutritional and metabolic diseases: Secondary | ICD-10-CM

## 2020-03-01 DIAGNOSIS — E039 Hypothyroidism, unspecified: Secondary | ICD-10-CM | POA: Diagnosis not present

## 2020-03-01 DIAGNOSIS — Z79899 Other long term (current) drug therapy: Secondary | ICD-10-CM

## 2020-03-01 DIAGNOSIS — Z5181 Encounter for therapeutic drug level monitoring: Secondary | ICD-10-CM | POA: Diagnosis not present

## 2020-03-01 DIAGNOSIS — F902 Attention-deficit hyperactivity disorder, combined type: Secondary | ICD-10-CM

## 2020-03-01 DIAGNOSIS — F3177 Bipolar disorder, in partial remission, most recent episode mixed: Secondary | ICD-10-CM

## 2020-03-01 MED ORDER — LAMOTRIGINE 25 MG PO TABS: 50.0000 mg | ORAL_TABLET | Freq: Two times a day (BID) | ORAL | 0 refills | Status: DC

## 2020-03-01 MED ORDER — AMPHETAMINE-DEXTROAMPHET ER 20 MG PO CP24: 20.0000 mg | ORAL_CAPSULE | Freq: Every morning | ORAL | 0 refills | Status: DC

## 2020-03-01 NOTE — Telephone Encounter (Signed)
Spoke with pt. States that he needs a refill on his CPAP supplies. Advised him that he would need an appointment before we could reorder his supplies. OV has been scheduled for 03/03/20 at 1530. Nothing further was needed.

## 2020-03-01 NOTE — Patient Instructions (Addendum)
You had labs performed today.  You will be contacted with the results of the labs once they are available, usually in the next 3 business days for routine lab work.  If you have an active my chart account, they will be released to your MyChart.  If you prefer to have these labs released to you via telephone, please let us know.  If you had a pap smear or biopsy performed, expect to be contacted in about 7-10 days.  We have increased the Lamictal to 50mg  twice daily. I have changed Vyvanse to Adderall

## 2020-03-03 ENCOUNTER — Ambulatory Visit (INDEPENDENT_AMBULATORY_CARE_PROVIDER_SITE_OTHER): Payer: 59 | Admitting: Primary Care

## 2020-03-03 ENCOUNTER — Encounter: Payer: Self-pay | Admitting: Primary Care

## 2020-03-03 ENCOUNTER — Other Ambulatory Visit: Payer: Self-pay

## 2020-03-03 VITALS — BP 120/72 | HR 107 | Temp 96.4°F | Ht 71.0 in | Wt >= 6400 oz

## 2020-03-03 DIAGNOSIS — J302 Other seasonal allergic rhinitis: Secondary | ICD-10-CM

## 2020-03-03 DIAGNOSIS — J3089 Other allergic rhinitis: Secondary | ICD-10-CM | POA: Diagnosis not present

## 2020-03-03 DIAGNOSIS — K2100 Gastro-esophageal reflux disease with esophagitis, without bleeding: Secondary | ICD-10-CM

## 2020-03-03 DIAGNOSIS — G4733 Obstructive sleep apnea (adult) (pediatric): Secondary | ICD-10-CM

## 2020-03-03 LAB — TOXASSURE SELECT 13 (MW), URINE

## 2020-03-03 MED ORDER — FEXOFENADINE HCL 180 MG PO TABS: 180.0000 mg | ORAL_TABLET | Freq: Every day | ORAL | Status: DC

## 2020-03-03 MED ORDER — FLUTICASONE PROPIONATE 50 MCG/ACT NA SUSP
1.0000 | Freq: Every day | NASAL | 2 refills | Status: DC
Start: 2020-03-03 — End: 2020-08-02

## 2020-03-03 NOTE — Progress Notes (Addendum)
_0  ID: [name], male    DOB: 09/28/1986, 34 y.o.   MRN: [id number]  Chief Complaint  Patient presents with  . Follow-up    Referring provider: [name], DO  HPI: Patient is a 34 yo male, never smoker. PMH significant for syncope, OSA, Seasonal allergies, and GERD. Patient is seen by Dr. Annamaria Boots. Last seen by Wyn Quaker NP on 08/24/2018.  03/03/2020  Patient presents today for annual visit follow up.  Patients needs to be seen today to get new CPAP equipment from his DME company. He is doing well and has no complaints. Patient wears nasal mask CPAP and is tolerating his current settings well. He is compliant and denies any leakages. He denies any daytime drowsiness, hypertension, chest pain, palpitations, sob, wheezing, swelling, headaches. Epworth score of 2   Compliance Report: Uses 87% of the days; 73% greater than 4 hours; min P 5 cmH2O; max P 15 cmH2O; Pressure 95th percentile is 11.9 cmH2O; Leaks 95th percentile is 31.8 cmH2O; AHI 2.2  He does have some complaints of mild seasonal allergies which he sometimes takes OTC Allegra and Flonase for. His symptoms are itchy eyes, post nasal drip, and nasal congestion. Patient is willing to take these medications daily if this will help with his symptoms.   GERD is currently controlled on Protonix 40 mg. Denies heartburn, indigestion, sob, cough.   Imaging: CXR 02/01/2019: Stable heart size and mediastinal contours are within normal limits. Both lungs are clear. The visualized skeletal structures are unremarkable  Allergies  Allergen Reactions  . Ibuprofen Other (See Comments)    Makes ulcers bleed  . Influenza Vaccines Shortness Of Breath and Other (See Comments)    Rash and unable to breathe well  . Tylenol [Acetaminophen] Other (See Comments)    Bleeding ulcers  . Bee Venom Swelling    SWELLING REACTION UNSPECIFIED   . Tramadol Itching    Immunization History  Administered Date(s) Administered  . DTaP  02/14/1986, 04/25/1986, 08/15/1986, 09/05/1987, 02/09/1991  . Hepatitis B 09/29/1998, 02/07/1999  . HiB (PRP-OMP) 01/23/1988  . IPV 02/14/1986, 04/25/1986, 09/05/1987, 02/09/1991  . Influenza Split 08/25/2010, 08/20/2012, 08/25/2014  . MMR 02/28/1987, 02/09/1991    Past Medical History:  Diagnosis Date  . ADHD (attention deficit hyperactivity disorder)   . Anal fissure   . Anxiety   . Bipolar disorder ([place])   . Chronic lower back pain   . Depression   . Esophagitis    Distal esophageal erosions consistent with mild erosive  reflux esophagitis 12/2008 by EGD   . External hemorrhoids   . Gastric polyps   . Gastric ulcer   . GERD (gastroesophageal reflux disease)   . Hepatic steatosis   . Hiatal hernia   . History of stomach ulcers   . Hx MRSA infection 8/08   right thigh  . Hx of colonic polyps   . Hypercholesteremia    denies  . Hypothyroidism   . Internal hemorrhoids   . Irritable bowel syndrome (IBS)   . Obesity   . Occult GI bleeding 12/2008   Trivial upper GI bleed/uncontrolled GERD by upper endoscopy 12/2008, normal f/u endoscopy 03/2009 with SBCE at that time  . OSA on CPAP   . Pain management   . Pneumonia 09/01/2015   "on ATB still" (09/04/2015)  . S/P colonoscopy 11/10, 10/08   Dr [name]  . Schizophrenia [facility])   . Sleep apnea   . Thyroid function test abnormal    Noted in 2011  discharge  . Tobacco dipper   . Wears contact lenses     Tobacco History: Social History   Tobacco Use  Smoking Status Never Smoker  Smokeless Tobacco Current User  . Types: Snuff   Ready to quit: Not Answered Counseling given: Not Answered   Outpatient Medications Prior to Visit  Medication Sig Dispense Refill  . amphetamine-dextroamphetamine (ADDERALL XR) 20 MG 24 hr capsule Take 1 capsule (20 mg total) by mouth in the morning. 30 capsule 0  . cyclobenzaprine (FLEXERIL) 10 MG tablet Take 1 tablet (10 mg total) by mouth 3 (three) times daily as needed for muscle  spasms. 90 tablet 2  . dicyclomine (BENTYL) 10 MG capsule TAKE 1 CAPSULE BY MOUTH 4 TIMES DAILY BEFORE MEAL(S) AND AT BEDTIME 120 capsule 0  . gabapentin (NEURONTIN) 300 MG capsule Take 1 capsule (300 mg total) by mouth 3 (three) times daily. 90 capsule 5  . lamoTRIgine (LAMICTAL) 25 MG tablet Take 2 tablets (50 mg total) by mouth 2 (two) times daily. 120 tablet 0  . levothyroxine (SYNTHROID) 75 MCG tablet Take 1 tablet (75 mcg total) by mouth daily before breakfast. 90 tablet 3  . ondansetron (ZOFRAN ODT) 4 MG disintegrating tablet Take 1 tablet (4 mg total) by mouth every 8 (eight) hours as needed for nausea or vomiting. 20 tablet 0  . pantoprazole (PROTONIX) 40 MG tablet Take 1 tablet by mouth twice daily 180 tablet 1   No facility-administered medications prior to visit.      Review of Systems  Review of Systems  Constitutional: Negative for chills, fatigue, fever and unexpected weight change.  HENT: Positive for congestion and postnasal drip. Negative for sneezing.   Eyes: Positive for itching.  Respiratory: Negative for cough, chest tightness, shortness of breath and wheezing.   Cardiovascular: Negative for chest pain, palpitations and leg swelling.  Allergic/Immunologic:       Seasonal allergies     Physical Exam  BP 120/72 (BP Location: Right Arm, Cuff Size: Normal)   Pulse (!) 107   Temp (!) 96.4 F (35.8 C) (Temporal)   Ht 5' 11" (1.803 m)   Wt (!) 408 lb 12.8 oz (185.4 kg)   SpO2 98% Comment: RA  BMI 57.02 kg/m  Physical Exam Constitutional:      Appearance: Normal appearance.  HENT:     Head: Normocephalic.     Mouth/Throat:     Mouth: Mucous membranes are moist.     Comments: Mallampati score of 1 Cardiovascular:     Rate and Rhythm: Normal rate and regular rhythm.  Neurological:     Mental Status: He is alert.      Lab Results:  CBC    Component Value Date/Time   WBC 7.9 03/01/2020 0840   WBC 11.4 (H) 09/06/2019 1935   RBC 5.38 03/01/2020 0840    RBC 5.67 09/06/2019 1935   HGB 15.3 03/01/2020 0840   HCT 46.1 03/01/2020 0840   PLT 293 03/01/2020 0840   MCV 86 03/01/2020 0840   MCH 28.4 03/01/2020 0840   MCH 28.2 09/06/2019 1935   MCHC 33.2 03/01/2020 0840   MCHC 32.3 09/06/2019 1935   RDW 14.3 03/01/2020 0840   LYMPHSABS 2.4 03/01/2020 0840   MONOABS 0.5 02/01/2019 2222   EOSABS 0.1 03/01/2020 0840   BASOSABS 0.1 03/01/2020 0840    BMET    Component Value Date/Time   NA 140 12/01/2019 1025   K 4.3 12/01/2019 1025   CL 103 12/01/2019 1025  CO2 25 12/01/2019 1025   GLUCOSE 91 12/01/2019 1025   GLUCOSE 88 09/06/2019 1935   BUN 7 12/01/2019 1025   CREATININE 1.02 12/01/2019 1025   CALCIUM 9.3 12/01/2019 1025   GFRNONAA 95 12/01/2019 1025   GFRAA 110 12/01/2019 1025    BNP No results found for: BNP  ProBNP No results found for: PROBNP  Imaging: No results found.   Assessment & Plan:   Obstructive sleep apnea Patient is compliant with CPAP and reports benefit from use.  No issues with mask fit or pressure setting Pressure 5-15 (11.9- 95%); AHI 2.2 No changed today  Continue wearing CPAP with goal 4-6 hours at night. Do not drive if experiencing any fatigue or sleepiness.  Renew CPAP supplies with DME company.  Follow up in one year or sooner as needed.  GERD Continue protonix 40 mg tablet daily  Seasonal and perennial allergic rhinitis RX sent to take allegra and flonase daily     Martyn Ehrich, NP 03/03/2020

## 2020-03-03 NOTE — Assessment & Plan Note (Addendum)
RX sent to take allegra and flonase daily

## 2020-03-03 NOTE — Assessment & Plan Note (Signed)
Continue protonix 40 mg tablet daily

## 2020-03-03 NOTE — Assessment & Plan Note (Addendum)
Patient is compliant with CPAP and reports benefit from use.  No issues with mask fit or pressure setting Pressure 5-15 (11.9- 95%); AHI 2.2 No changed today  Continue wearing CPAP with goal 4-6 hours at night. Do not drive if experiencing any fatigue or sleepiness.  Renew CPAP supplies with DME company.  Follow up in one year or sooner as needed.

## 2020-03-03 NOTE — Patient Instructions (Addendum)
Sleep Apnea Continue wearing CPAP with goals 4-6 hours at night. Do not drive if experiencing any fatigue or sleepiness. Renew CPAP supplies with DME company. Follow up as needed.  Seasonal Allergies Flonase 50 mcg/act 1 puff into each nare daily Allegra 180 mg take 1 pill by mouth daily   GERD Continue Protonix 40 mg tablet by mouth daily  Follow up with pulmonology office in 1 year or sooner if needed.

## 2020-03-06 LAB — CBC WITH DIFFERENTIAL/PLATELET
Basophils Absolute: 0.1 10*3/uL (ref 0.0–0.2)
Basos: 1 %
EOS (ABSOLUTE): 0.1 10*3/uL (ref 0.0–0.4)
Eos: 2 %
Hematocrit: 46.1 % (ref 37.5–51.0)
Hemoglobin: 15.3 g/dL (ref 13.0–17.7)
Immature Grans (Abs): 0 10*3/uL (ref 0.0–0.1)
Immature Granulocytes: 0 %
Lymphocytes Absolute: 2.4 10*3/uL (ref 0.7–3.1)
Lymphs: 31 %
MCH: 28.4 pg (ref 26.6–33.0)
MCHC: 33.2 g/dL (ref 31.5–35.7)
MCV: 86 fL (ref 79–97)
Monocytes Absolute: 0.4 10*3/uL (ref 0.1–0.9)
Monocytes: 6 %
Neutrophils Absolute: 4.8 10*3/uL (ref 1.4–7.0)
Neutrophils: 60 %
Platelets: 293 10*3/uL (ref 150–450)
RBC: 5.38 x10E6/uL (ref 4.14–5.80)
RDW: 14.3 % (ref 11.6–15.4)
WBC: 7.9 10*3/uL (ref 3.4–10.8)

## 2020-03-06 LAB — IRON,TIBC AND FERRITIN PANEL
Ferritin: 82 ng/mL (ref 30–400)
Iron Saturation: 19 % (ref 15–55)
Iron: 63 ug/dL (ref 38–169)
Total Iron Binding Capacity: 331 ug/dL (ref 250–450)
UIBC: 268 ug/dL (ref 111–343)

## 2020-03-06 LAB — THYROID PANEL WITH TSH
Free Thyroxine Index: 1.7 (ref 1.2–4.9)
T3 Uptake Ratio: 28 % (ref 24–39)
T4, Total: 6 ug/dL (ref 4.5–12.0)
TSH: 1.89 u[IU]/mL (ref 0.450–4.500)

## 2020-03-06 LAB — HEMOCHROMATOSIS DNA-PCR(C282Y,H63D)

## 2020-03-06 LAB — TRANSFERRIN: Transferrin: 286 mg/dL (ref 177–329)

## 2020-03-15 ENCOUNTER — Other Ambulatory Visit: Payer: Self-pay | Admitting: Family Medicine

## 2020-03-15 DIAGNOSIS — M5126 Other intervertebral disc displacement, lumbar region: Secondary | ICD-10-CM

## 2020-03-15 NOTE — Telephone Encounter (Signed)
Last office visit 03/01/2020 Last refill 12/15/2019, #90, 2 refills

## 2020-03-22 ENCOUNTER — Emergency Department (HOSPITAL_COMMUNITY)
Admission: EM | Admit: 2020-03-22 | Discharge: 2020-03-22 | Disposition: A | Discharge status: Home / Self Care | Payer: 59 | Attending: Emergency Medicine | Admitting: Emergency Medicine

## 2020-03-22 ENCOUNTER — Other Ambulatory Visit: Payer: Self-pay | Admitting: Family Medicine

## 2020-03-22 ENCOUNTER — Encounter (HOSPITAL_COMMUNITY): Payer: Self-pay | Admitting: Emergency Medicine

## 2020-03-22 ENCOUNTER — Other Ambulatory Visit: Payer: Self-pay

## 2020-03-22 ENCOUNTER — Emergency Department (HOSPITAL_COMMUNITY): Payer: 59

## 2020-03-22 DIAGNOSIS — F909 Attention-deficit hyperactivity disorder, unspecified type: Secondary | ICD-10-CM | POA: Insufficient documentation

## 2020-03-22 DIAGNOSIS — E039 Hypothyroidism, unspecified: Secondary | ICD-10-CM | POA: Diagnosis not present

## 2020-03-22 DIAGNOSIS — Z79899 Other long term (current) drug therapy: Secondary | ICD-10-CM | POA: Diagnosis not present

## 2020-03-22 DIAGNOSIS — R1084 Generalized abdominal pain: Secondary | ICD-10-CM | POA: Insufficient documentation

## 2020-03-22 DIAGNOSIS — K922 Gastrointestinal hemorrhage, unspecified: Secondary | ICD-10-CM | POA: Diagnosis not present

## 2020-03-22 LAB — COMPREHENSIVE METABOLIC PANEL
ALT: 32 U/L (ref 0–44)
AST: 23 U/L (ref 15–41)
Albumin: 4.4 g/dL (ref 3.5–5.0)
Alkaline Phosphatase: 56 U/L (ref 38–126)
Anion gap: 11 (ref 5–15)
BUN: 12 mg/dL (ref 6–20)
CO2: 24 mmol/L (ref 22–32)
Calcium: 9 mg/dL (ref 8.9–10.3)
Chloride: 102 mmol/L (ref 98–111)
Creatinine, Ser: 0.92 mg/dL (ref 0.61–1.24)
GFR calc Af Amer: >60 mL/min (ref >60)
GFR calc non Af Amer: >60 mL/min (ref >60)
Glucose, Bld: 87 mg/dL (ref 70–99)
Potassium: 3.7 mmol/L (ref 3.5–5.1)
Sodium: 137 mmol/L (ref 135–145)
Total Bilirubin: 1 mg/dL (ref 0.3–1.2)
Total Protein: 7 g/dL (ref 6.5–8.1)

## 2020-03-22 LAB — CBC WITH DIFFERENTIAL/PLATELET
Abs Immature Granulocytes: 0.03 10*3/uL (ref 0.00–0.07)
Basophils Absolute: 0 10*3/uL (ref 0.0–0.1)
Basophils Relative: 0 %
Eosinophils Absolute: 0.1 10*3/uL (ref 0.0–0.5)
Eosinophils Relative: 1 %
HCT: 45.8 % (ref 39.0–52.0)
Hemoglobin: 15.1 g/dL (ref 13.0–17.0)
Immature Granulocytes: 0 %
Lymphocytes Relative: 28 %
Lymphs Abs: 3.2 10*3/uL (ref 0.7–4.0)
MCH: 28.3 pg (ref 26.0–34.0)
MCHC: 33 g/dL (ref 30.0–36.0)
MCV: 85.8 fL (ref 80.0–100.0)
Monocytes Absolute: 0.7 10*3/uL (ref 0.1–1.0)
Monocytes Relative: 6 %
Neutro Abs: 7.4 10*3/uL (ref 1.7–7.7)
Neutrophils Relative %: 65 %
Platelets: 300 10*3/uL (ref 150–400)
RBC: 5.34 MIL/uL (ref 4.22–5.81)
RDW: 14.3 % (ref 11.5–15.5)
WBC: 11.5 10*3/uL — ABNORMAL HIGH (ref 4.0–10.5)
nRBC: 0 % (ref 0.0–0.2)

## 2020-03-22 LAB — TYPE AND SCREEN
ABO/RH(D): A POS
Antibody Screen: NEGATIVE

## 2020-03-22 LAB — PROTIME-INR
INR: 1 (ref 0.8–1.2)
Prothrombin Time: 12.8 seconds (ref 11.4–15.2)

## 2020-03-22 MED ORDER — IOHEXOL 300 MG/ML  SOLN
100.0000 mL | Freq: Once | INTRAMUSCULAR | Status: AC | PRN
  Administered 2020-03-22: 100 mL via INTRAVENOUS

## 2020-03-22 MED ORDER — MORPHINE SULFATE (PF) 4 MG/ML IV SOLN
4.0000 mg | Freq: Once | INTRAVENOUS | Status: AC
  Administered 2020-03-22: 4 mg via INTRAVENOUS
  Filled 2020-03-22: qty 1

## 2020-03-22 MED ORDER — ONDANSETRON HCL 4 MG/2ML IJ SOLN
4.0000 mg | Freq: Once | INTRAMUSCULAR | Status: AC
  Administered 2020-03-22: 4 mg via INTRAVENOUS
  Filled 2020-03-22: qty 2

## 2020-03-22 MED ORDER — PANTOPRAZOLE SODIUM 40 MG IV SOLR
40.0000 mg | Freq: Once | INTRAVENOUS | Status: AC
  Administered 2020-03-22: 40 mg via INTRAVENOUS
  Filled 2020-03-22: qty 40

## 2020-03-22 NOTE — ED Triage Notes (Signed)
abd pain x 1 week. Having loose bright red bloody stools with clots since yesterday. Vomited x 2 today.

## 2020-03-22 NOTE — ED Provider Notes (Signed)
   Pt signed out to me by Evalee Jefferson, PA-C pending CT abd/pelvis results.    Pt here with recurrent abdominal pain, bloody stools.  Hx of same.  Had a colonoscopy and upper endoscopy in 12/20 without definite source of bleeding found.     Labs Reviewed  CBC WITH DIFFERENTIAL/PLATELET - Abnormal; Notable for the following components:      Result Value   WBC 11.5 (*)    All other components within normal limits  COMPREHENSIVE METABOLIC PANEL  PROTIME-INR  TYPE AND SCREEN     CT ABDOMEN PELVIS W CONTRAST  Result Date: 03/22/2020 CLINICAL DATA:  Acute abdominal pain. GI bleed, bright red stool with clots since yesterday. Two episodes of vomiting today. EXAM: CT ABDOMEN AND PELVIS WITH CONTRAST TECHNIQUE: Multidetector CT imaging of the abdomen and pelvis was performed using the standard protocol following bolus administration of intravenous contrast. CONTRAST:  129mL OMNIPAQUE IOHEXOL 300 MG/ML  SOLN COMPARISON:  CT 09/07/2019 FINDINGS: Lower chest: Lung bases are clear. Hepatobiliary: Prominent liver spanning 22 cm cranial caudal. No focal hepatic lesion. Gallbladder physiologically distended, no calcified stone. No biliary dilatation. Pancreas: No ductal dilatation or inflammation. Spleen: Normal in size without focal abnormality. Splenule at the hilum. Adrenals/Urinary Tract: Normal adrenal glands. No hydronephrosis or perinephric edema. Homogeneous renal enhancement. 3.8 cm cyst in the medial left kidney. Urinary bladder is physiologically distended without wall thickening. Stomach/Bowel: No colonic wall thickening or pericolonic edema. No significant diverticular disease. Moderate stool burden in the proximal colon, distal colon decompressed. The appendix is not visualized. No pericecal inflammation. Small bowel is decompressed and unremarkable. No small bowel inflammation. Terminal ileum is normal. Nondistended stomach. Vascular/Lymphatic: Normal caliber abdominal aorta. Portal vein is patent.  Few prominent porta hepatis nodes are likely reactive. No enlarged lymph nodes in the abdomen or pelvis. Reproductive: Prostate is unremarkable. Other: No free air, free fluid, or intra-abdominal fluid collection. Diminutive fat containing umbilical hernia. Musculoskeletal: Mild degenerative change in the lower lumbar spine. There are no acute or suspicious osseous abnormalities. IMPRESSION: 1. No acute abnormality or explanation for GI bleed. 2. Mild hepatomegaly. Electronically Signed   By: Keith Rake M.D.   On: 03/22/2020 17:55   Pt is hemodynamically stable, will he appears well  And nontoxic.  Labs reassuring.  Hemoglobin and hematocrit consistent with levels from 3 weeks ago.  Vital signs reassuring.  Bright red blood per rectum with past history of hemorrhoids which are likely source of patient's current rectal bleeding.  Doubt acute surgical abdomen  1920  Consulted on call provider for [facility], Dr. Silverio Decamp and discussed findings.  They will see pt in office next week.     Kem Parkinson, PA-C 03/22/20 2029    Carmin Muskrat, MD 03/23/20 [place]

## 2020-03-22 NOTE — Discharge Instructions (Signed)
Your blood work and CT this evening was reassuring.  Continue your current medications as directed.  Call your GI provider tomorrow or someone from their office may contact you tomorrow to arrange a follow-up appointment

## 2020-03-22 NOTE — ED Provider Notes (Signed)
[facility] EMERGENCY DEPARTMENT Provider Note   CSN: [id number] Arrival date & time: 03/22/20  1333     History Chief Complaint  Patient presents with  . GI Bleeding    [name] is a 34 y.o. male with a history as outlined below, most significant for distant history of gastric ulcer with persistent GERD currently on Protonix 40 mg twice daily, hiatal hernia, internal hemorrhoids and a history of occult GI bleeding including prior history of multiple GI evaluations including endoscopy and colonoscopy, most recently December 2020 (Dr. Bryan Lemma) presenting with a 24-hour history of abdominal pain in association with 2 episodes of bloody diarrhea, describing watery diarrhea with significant passage of red blood along with dark appearing clots, one episode yesterday evening the other today prior to arrival.  Additionally he reports generalized abdominal pain described as sharp and cramping.  He vomited x1 today around noon, mostly clear liquid but included residual tater tots which he ate last night along with a few strands of streaky blood.  He has taken his morning dose of Protonix today which stayed down.  He has had no other treatments for symptoms prior to arrival.  He denies chest pain or shortness of breath, also denies dizziness, no back pain, dysuria or other complaints.  Of note, pt has photo with him of BRB in toilet bowl with several small dark appearing clots from todays BM.   The history is provided by the patient.       Past Medical History:  Diagnosis Date  . ADHD (attention deficit hyperactivity disorder)   . Anal fissure   . Anxiety   . Bipolar disorder ([place])   . Chronic lower back pain   . Depression   . Esophagitis    Distal esophageal erosions consistent with mild erosive  reflux esophagitis 12/2008 by EGD   . External hemorrhoids   . Gastric polyps   . Gastric ulcer   . GERD (gastroesophageal reflux disease)   . Hepatic steatosis   . Hiatal hernia     . History of stomach ulcers   . Hx MRSA infection 8/08   right thigh  . Hx of colonic polyps   . Hypercholesteremia    denies  . Hypothyroidism   . Internal hemorrhoids   . Irritable bowel syndrome (IBS)   . Obesity   . Occult GI bleeding 12/2008   Trivial upper GI bleed/uncontrolled GERD by upper endoscopy 12/2008, normal f/u endoscopy 03/2009 with SBCE at that time  . OSA on CPAP   . Pain management   . Pneumonia 09/01/2015   "on ATB still" (09/04/2015)  . S/P colonoscopy 11/10, 10/08   Dr [name]  . Schizophrenia [facility])   . Sleep apnea   . Thyroid function test abnormal    Noted in 2011 discharge  . Tobacco dipper   . Wears contact lenses     Patient Active Problem List   Diagnosis Date Noted  . Gastroesophageal reflux disease with esophagitis without hemorrhage 12/01/2019  . Belching   . Gastric polyps   . Hematochezia   . Irritable bowel syndrome with diarrhea   . Grade II hemorrhoids   . Polyp of ascending colon   . 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 Seasonal and perennial allergic rhinitis 07/23/2014  . Abdominal mass of other site 06/15/2012  . Abdominal cramps 06/15/2012  . Obstructive sleep apnea 05/03/2012  . Syncope and collapse 05/03/2012  . Severe obesity (BMI >= 40) ([place]) 05/03/2012  . Anal fissure  05/27/2011  . UNSPECIFIED ANEMIA 09/11/2009  . Blood in stool 04/06/2009  . Lower abdominal pain 03/07/2009  . BIPOLAR DISORDER UNSPECIFIED 03/06/2009  . SMOKELESS TOBACCO ABUSE 03/06/2009  . Attention deficit hyperactivity disorder (ADHD) 03/06/2009  . GERD 03/06/2009  . Diarrhea 03/06/2009    Past Surgical History:  Procedure Laterality Date  . ANKLE FRACTURE SURGERY Left ~ 2008  . BACK SURGERY    . BIOPSY  11/02/2019   Procedure: BIOPSY;  Surgeon: Lavena Bullion, DO;  Location: [facility];  Service: Gastroenterology;;  EGD and Colon  . COLONOSCOPY  10/10/2009   anal papilla otherwise normal  . COLONOSCOPY WITH PROPOFOL N/A 11/02/2019    Procedure: COLONOSCOPY WITH PROPOFOL;  Surgeon: Lavena Bullion, DO;  Location: [facility];  Service: Gastroenterology;  Laterality: N/A;  . ESOPHAGOGASTRODUODENOSCOPY   04/07/2009   Normal esophagus, small hiatal hernia  . ESOPHAGOGASTRODUODENOSCOPY  01/09/2009   Distal esophageal erosions consistent with mild erosive reflux esophagitis, otherwise normal esophagus, small hiatal herniaotherwise normal stomach, D1-D2   . ESOPHAGOGASTRODUODENOSCOPY  08/26/2007   Normal esophagus, a small hiatal/hernia, otherwise normal stomach D1 through D3  . ESOPHAGOGASTRODUODENOSCOPY (EGD) WITH PROPOFOL N/A 11/02/2019   Procedure: ESOPHAGOGASTRODUODENOSCOPY (EGD) WITH PROPOFOL;  Surgeon: Lavena Bullion, DO;  Location: [facility];  Service: Gastroenterology;  Laterality: N/A;  . FRACTURE SURGERY    . ileocolonoscopy  08/26/2007    A normal rectum, colon, and terminal ileum  . INCISION AND DRAINAGE  09/04/2015   "reopened my back incsion"  . LUMBAR LAMINECTOMY/DECOMPRESSION MICRODISCECTOMY Left 09/23/2017   Procedure: Microdiscectomy - left - Lumbar four-Lumbar five;  Surgeon: Earnie Larsson, MD;  Location: [place];  Service: Neurosurgery;  Laterality: Left;  . LUMBAR MICRODISCECTOMY  08/21/2015  . LUMBAR MICRODISCECTOMY Left 09/23/2017   L4-5  . LUMBAR WOUND DEBRIDEMENT N/A 09/04/2015   Procedure: LUMBAR WOUND DEBRIDEMENT;  Surgeon: Consuella Lose, MD;  Location: [place] [facility];  Service: Neurosurgery;  Laterality: N/A;  . right side sugery     enlarged lymph node gland removed under arm pit age 57-5 years  . Small bowel capsule  04/11/2009    normal throughout  . VASECTOMY         Family History  Problem Relation Age of Onset  . Leukemia Father 69  . Colon polyps Father   . Clotting disorder Father   . Seizures Mother   . Irritable bowel syndrome Mother   . Bipolar disorder Brother   . ADD / ADHD Brother   . Diabetes Paternal Grandmother   . Ulcerative colitis Paternal Aunt   . Colon  cancer Neg Hx   . Liver cancer Neg Hx   . Esophageal cancer Neg Hx     Social History   Tobacco Use  . Smoking status: Never Smoker  . Smokeless tobacco: Current User    Types: Snuff  Substance Use Topics  . Alcohol use: Not Currently    Alcohol/week: 0.0 standard drinks    Comment: rare  . Drug use: No    Home Medications Prior to Admission medications   Medication Sig Start Date End Date Taking? Authorizing Provider  amphetamine-dextroamphetamine (ADDERALL XR) 20 MG 24 hr capsule Take 1 capsule (20 mg total) by mouth in the morning. 03/01/20  Yes Gottschalk, Ashly M, DO  cyclobenzaprine (FLEXERIL) 10 MG tablet TAKE 1 TABLET BY MOUTH THREE TIMES DAILY AS NEEDED FOR MUSCLE SPASMS Patient taking differently: Take 10 mg by mouth 3 (three) times daily.  03/15/20  Yes Ronnie Doss  M, DO  dicyclomine (BENTYL) 10 MG capsule TAKE 1 CAPSULE BY MOUTH 4 TIMES DAILY BEFORE MEAL(S) AND AT BEDTIME Patient taking differently: Take 10 mg by mouth 4 (four) times daily -  before meals and at bedtime.  03/22/20  Yes Gottschalk, Ashly M, DO  fluticasone (FLONASE) 50 MCG/ACT nasal spray Place 1 spray into both nostrils daily. Patient taking differently: Place 2 sprays into both nostrils daily.  03/03/20  Yes Martyn Ehrich, NP  gabapentin (NEURONTIN) 300 MG capsule Take 1 capsule (300 mg total) by mouth 3 (three) times daily. 12/15/19  Yes Ronnie Doss M, DO  lamoTRIgine (LAMICTAL) 25 MG tablet Take 2 tablets (50 mg total) by mouth 2 (two) times daily. 03/01/20  Yes Ronnie Doss M, DO  levothyroxine (SYNTHROID) 75 MCG tablet Take 1 tablet (75 mcg total) by mouth daily before breakfast. 12/02/19  Yes Gottschalk, Ashly M, DO  pantoprazole (PROTONIX) 40 MG tablet Take 1 tablet by mouth twice daily Patient taking differently: Take 40 mg by mouth 2 (two) times daily before a meal.  12/15/19  Yes Gottschalk, Ashly M, DO  ondansetron (ZOFRAN ODT) 4 MG disintegrating tablet Take 1 tablet (4 mg total) by  mouth every 8 (eight) hours as needed for nausea or vomiting. Patient not taking: Reported on 03/22/2020 09/07/19   Ezequiel Essex, MD    Allergies    Ibuprofen, Influenza vaccines, Tylenol [acetaminophen], Bee venom, and Tramadol  Review of Systems   Review of Systems  Constitutional: Negative for chills and fever.  HENT: Negative.   Eyes: Negative.   Respiratory: Negative for chest tightness and shortness of breath.   Cardiovascular: Negative for chest pain.  Gastrointestinal: Positive for abdominal pain, blood in stool, diarrhea, nausea and vomiting.  Genitourinary: Negative.   Musculoskeletal: Negative for arthralgias, joint swelling and neck pain.  Skin: Negative.  Negative for rash and wound.  Neurological: Negative for dizziness, weakness, light-headedness, numbness and headaches.  Psychiatric/Behavioral: Negative.     Physical Exam Updated Vital Signs BP 124/68   Pulse 88   Temp 98.3 F (36.8 C) (Oral)   Resp (!) 23   Ht 5\' 11"  (1.803 m)   Wt (!) 158.8 kg   SpO2 96%   BMI 48.82 kg/m   Physical Exam Vitals and nursing note reviewed.  Constitutional:      Appearance: He is well-developed.  HENT:     Head: Normocephalic and atraumatic.  Eyes:     Conjunctiva/sclera: Conjunctivae normal.  Cardiovascular:     Rate and Rhythm: Normal rate and regular rhythm.     Heart sounds: Normal heart sounds.  Pulmonary:     Effort: Pulmonary effort is normal.     Breath sounds: Normal breath sounds. No wheezing.  Abdominal:     General: Abdomen is protuberant. Bowel sounds are normal.     Palpations: Abdomen is soft.     Tenderness: There is abdominal tenderness in the epigastric area and periumbilical area.  Musculoskeletal:        General: Normal range of motion.     Cervical back: Normal range of motion.  Skin:    General: Skin is warm and dry.  Neurological:     Mental Status: He is alert.     ED Results / Procedures / Treatments   Labs (all labs ordered  are listed, but only abnormal results are displayed) Labs Reviewed  CBC WITH DIFFERENTIAL/PLATELET - Abnormal; Notable for the following components:      Result Value  WBC 11.5 (*)    All other components within normal limits  COMPREHENSIVE METABOLIC PANEL  PROTIME-INR  TYPE AND SCREEN    EKG None  Radiology No results found.  Procedures Procedures (including critical care time)  Medications Ordered in ED Medications  morphine 4 MG/ML injection 4 mg (4 mg Intravenous Given 03/22/20 1622)  ondansetron (ZOFRAN) injection 4 mg (4 mg Intravenous Given 03/22/20 1613)  pantoprazole (PROTONIX) injection 40 mg (40 mg Intravenous Given 03/22/20 1603)  iohexol (OMNIPAQUE) 300 MG/ML solution 100 mL (100 mLs Intravenous Contrast Given 03/22/20 1715)    ED Course  I have reviewed the triage vital signs and the nursing notes.  Pertinent labs & imaging results that were available during my care of the patient were reviewed by me and considered in my medical decision making (see chart for details).    MDM Rules/Calculators/A&P                      Pt with h/o of GIB of unclear source with history of same, although has h/o PUD.  No melena per description and photo, normal range BUN, doubt upper GI source.  Pending CT imaging given abd pain. If labs and pt remains stable with normal VS, he may be appropriate for close outpatient f/u.  Discussed with Kem Parkinson who assumes pt care.  Final Clinical Impression(s) / ED Diagnoses Final diagnoses:  Generalized abdominal pain  Gastrointestinal hemorrhage, unspecified gastrointestinal hemorrhage type    Rx / DC Orders ED Discharge Orders    None       Landis Martins 03/22/20 1721    Carmin Muskrat, MD 03/23/20 0021

## 2020-03-23 ENCOUNTER — Telehealth: Payer: Self-pay | Admitting: Gastroenterology

## 2020-03-23 NOTE — Telephone Encounter (Signed)
Spoke to patient who was seen in the ER on 03/22/20 for abdominal pain nausea vomiting and rectal bleeding. Patient was discharged with no orders other then to follow up with GI office. Labs and imaging were normal. Patient is insisting on a sooner follow up appointment then his scheduled 04/12/20 with Dr Bryan Lemma. He states he can not go on with his daily activities feeling the way he does. He feels that his hemorrhoids are getting bigger and needs treatment. He was advised to purchase OTC hemorrhoid cream or suppositories for relief. Appointment scheduled with Earnie Larsson for 03/31/20

## 2020-03-31 ENCOUNTER — Encounter: Payer: Self-pay | Admitting: Physician Assistant

## 2020-03-31 ENCOUNTER — Ambulatory Visit (INDEPENDENT_AMBULATORY_CARE_PROVIDER_SITE_OTHER): Payer: 59 | Admitting: Physician Assistant

## 2020-03-31 VITALS — BP 110/56 | HR 104 | Temp 98.7°F | Ht 69.0 in | Wt >= 6400 oz

## 2020-03-31 DIAGNOSIS — K2100 Gastro-esophageal reflux disease with esophagitis, without bleeding: Secondary | ICD-10-CM

## 2020-03-31 DIAGNOSIS — K625 Hemorrhage of anus and rectum: Secondary | ICD-10-CM

## 2020-03-31 DIAGNOSIS — K580 Irritable bowel syndrome with diarrhea: Secondary | ICD-10-CM

## 2020-03-31 MED ORDER — FAMOTIDINE 40 MG PO TABS
40.0000 mg | ORAL_TABLET | Freq: Two times a day (BID) | ORAL | 5 refills | Status: DC
Start: 2020-03-31 — End: 2020-06-07

## 2020-03-31 MED ORDER — DICYCLOMINE HCL 20 MG PO TABS: 20.0000 mg | ORAL_TABLET | Freq: Three times a day (TID) | ORAL | 5 refills | Status: DC

## 2020-03-31 NOTE — Patient Instructions (Addendum)
If you are age 34 or older, your body mass index should be between 23-30. Your Body mass index is 60.55 kg/m. If this is out of the aforementioned range listed, please consider follow up with your Primary Care Provider.  If you are age 89 or younger, your body mass index should be between 19-25. Your Body mass index is 60.55 kg/m. If this is out of the aformentioned range listed, please consider follow up with your Primary Care Provider.   We have sent the following medications to your pharmacy for you to pick up at your convenience: Increase Bentyl to 20 mg four times daily 30 minutes before each meal and at bedtime.  Start Pepcid 40 mg twice daily 30 minutes before breakfast and dinner.   Follow up with Dr. Bryan Lemma on June 25 @ 8:40 am in our [facility] location [address] [place], [place], [place] [address]

## 2020-03-31 NOTE — Progress Notes (Signed)
Chief Complaint: Follow-up ER visit for abdominal pain  HPI:    [name] is a 34 year old Caucasian male with past medical history as listed below including morbid obesity, IBS, GERD, anxiety, bipolar, depression, ADHD, OSA on CPAP and others, known to Dr. Bryan Lemma, who was referred to me by [name], DO for follow-up after being in the ER.      11/02/2019 patient consulted by our service for abdominal pain and diarrhea.  That time discussed he had IBS-D with recurrent lower abdominal pain typically associated with nausea and diarrhea and can have episodes of bright red blood per rectum.  There is extensive evaluation in 2000 06-2009 with [facility] for abdominal pain, change in stools, hematochezia and FOBT positive stools as outlined below.  Also has a GERD with erosive esophagitis and longstanding history of heartburn and regurgitation generally controlled Protonix 40 mg p.o. twice daily.  He was resumed on Bentyl for IBS and Protonix as prescribed.    11/02/2019 colonoscopy with hemorrhoids and one 2 mm polyp in the ascending colon as well as nonbleeding internal hemorrhoids, pathology showed sessile serrated polyp in the ascending colon.  Repeat was recommended in 7 years.  EGD with irregular Z-line, few gastric polyps and otherwise normal.  Pathology showed fundic gland polyps, mild chronic gastritis, reflux changes at the GE junction.    03/22/2020 seen in the ED for generalized abdominal pain.  CBC with a white count 11.5, normal CMP.  CT abdomen pelvis with contrast with no acute abnormality.  Mild hepatomegaly.    Today, the patient presents to clinic and explains that all of his symptoms have worsened and are still uncontrolled.  Currently using Pantoprazole 40 mg twice a day and has been on multiple acid suppression medicines before this.  Tells me that they have discussed procedures to help him with this in the future if it continues and he thinks that it may be getting that  bad.    Also tells me that his abdominal cramping is terrible, before he went to the ER this was so bad that he was nauseous and he started passing blood clots and bright red blood from his rectum 2 days before he was seen in the ER and these lasted for 4 days, now he is back to his normal scant bright red blood occasionally.  Tells me he has 3 liquid stools a day which has not changed.  Also sulfur burps.    Denies fever, chills, weight loss or symptoms that awaken him from sleep.  Previous GI history: Endoscopic history: -EGD (08/2007, Dr. Leonides Schanz): Small hiatal hernia -Colonoscopy (08/2007, Dr. Gala Romney): Normal -EGD (12/2008, Dr. Gala Romney): Distal esophageal erosions consistent with mild erosive reflux esophagitis, small hiatal hernia -EGD (03/2009, Dr. Gala Romney): Small hiatal hernia, otherwise normal -VCE (03/2009, Dr.Rourk): Normal -Colonoscopy (09/2009): Hypertrophied anal papilla, otherwise normal  Aside from father with colon polyps, no known family history of CRC, GI malignancy, liver disease, pancreatic disease, or IBD Past Medical History:  Diagnosis Date  . ADHD (attention deficit hyperactivity disorder)   . Anal fissure   . Anxiety   . Bipolar disorder ([place])   . Chronic lower back pain   . Depression   . Esophagitis    Distal esophageal erosions consistent with mild erosive  reflux esophagitis 12/2008 by EGD   . External hemorrhoids   . Gastric polyps   . Gastric ulcer   . GERD (gastroesophageal reflux disease)   . Hepatic steatosis   . Hiatal  hernia   . History of stomach ulcers   . Hx MRSA infection 8/08   right thigh  . Hx of colonic polyps   . Hypercholesteremia    denies  . Hypothyroidism   . Internal hemorrhoids   . Irritable bowel syndrome (IBS)   . Obesity   . Occult GI bleeding 12/2008   Trivial upper GI bleed/uncontrolled GERD by upper endoscopy 12/2008, normal f/u endoscopy 03/2009 with SBCE at that time  . OSA on CPAP   . Pain management   . Pneumonia 09/01/2015    "on ATB still" (09/04/2015)  . S/P colonoscopy 11/10, 10/08   Dr [name]  . Schizophrenia [facility])   . Sleep apnea   . Thyroid function test abnormal    Noted in 2011 discharge  . Tobacco dipper   . Wears contact lenses     Past Surgical History:  Procedure Laterality Date  . ANKLE FRACTURE SURGERY Left ~ 2008  . BACK SURGERY    . BIOPSY  11/02/2019   Procedure: BIOPSY;  Surgeon: Lavena Bullion, DO;  Location: [facility];  Service: Gastroenterology;;  EGD and Colon  . COLONOSCOPY  10/10/2009   anal papilla otherwise normal  . COLONOSCOPY WITH PROPOFOL N/A 11/02/2019   Procedure: COLONOSCOPY WITH PROPOFOL;  Surgeon: Lavena Bullion, DO;  Location: [facility];  Service: Gastroenterology;  Laterality: N/A;  . ESOPHAGOGASTRODUODENOSCOPY   04/07/2009   Normal esophagus, small hiatal hernia  . ESOPHAGOGASTRODUODENOSCOPY  01/09/2009   Distal esophageal erosions consistent with mild erosive reflux esophagitis, otherwise normal esophagus, small hiatal herniaotherwise normal stomach, D1-D2   . ESOPHAGOGASTRODUODENOSCOPY  08/26/2007   Normal esophagus, a small hiatal/hernia, otherwise normal stomach D1 through D3  . ESOPHAGOGASTRODUODENOSCOPY (EGD) WITH PROPOFOL N/A 11/02/2019   Procedure: ESOPHAGOGASTRODUODENOSCOPY (EGD) WITH PROPOFOL;  Surgeon: Lavena Bullion, DO;  Location: [facility];  Service: Gastroenterology;  Laterality: N/A;  . FRACTURE SURGERY    . ileocolonoscopy  08/26/2007    A normal rectum, colon, and terminal ileum  . INCISION AND DRAINAGE  09/04/2015   "reopened my back incsion"  . LUMBAR LAMINECTOMY/DECOMPRESSION MICRODISCECTOMY Left 09/23/2017   Procedure: Microdiscectomy - left - Lumbar four-Lumbar five;  Surgeon: Earnie Larsson, MD;  Location: [facility];  Service: Neurosurgery;  Laterality: Left;  . LUMBAR MICRODISCECTOMY  08/21/2015  . LUMBAR MICRODISCECTOMY Left 09/23/2017   L4-5  . LUMBAR WOUND DEBRIDEMENT N/A 09/04/2015   Procedure: LUMBAR WOUND  DEBRIDEMENT;  Surgeon: Consuella Lose, MD;  Location: [facility];  Service: Neurosurgery;  Laterality: N/A;  . right side sugery     enlarged lymph node gland removed under arm pit age 85-5 years  . Small bowel capsule  04/11/2009    normal throughout  . VASECTOMY      Current Outpatient Medications  Medication Sig Dispense Refill  . amphetamine-dextroamphetamine (ADDERALL XR) 20 MG 24 hr capsule Take 1 capsule (20 mg total) by mouth in the morning. 30 capsule 0  . cyclobenzaprine (FLEXERIL) 10 MG tablet TAKE 1 TABLET BY MOUTH THREE TIMES DAILY AS NEEDED FOR MUSCLE SPASMS (Patient taking differently: Take 10 mg by mouth 3 (three) times daily. ) 90 tablet 2  . dicyclomine (BENTYL) 10 MG capsule TAKE 1 CAPSULE BY MOUTH 4 TIMES DAILY BEFORE MEAL(S) AND AT BEDTIME (Patient taking differently: Take 10 mg by mouth 4 (four) times daily -  before meals and at bedtime. ) 120 capsule 0  . fluticasone (FLONASE) 50 MCG/ACT nasal spray Place 1 spray into both nostrils daily. (  Patient taking differently: Place 2 sprays into both nostrils daily. ) 16 g 2  . gabapentin (NEURONTIN) 300 MG capsule Take 1 capsule (300 mg total) by mouth 3 (three) times daily. 90 capsule 5  . lamoTRIgine (LAMICTAL) 25 MG tablet Take 2 tablets (50 mg total) by mouth 2 (two) times daily. 120 tablet 0  . levothyroxine (SYNTHROID) 75 MCG tablet Take 1 tablet (75 mcg total) by mouth daily before breakfast. 90 tablet 3  . ondansetron (ZOFRAN ODT) 4 MG disintegrating tablet Take 1 tablet (4 mg total) by mouth every 8 (eight) hours as needed for nausea or vomiting. (Patient not taking: Reported on 03/22/2020) 20 tablet 0  . pantoprazole (PROTONIX) 40 MG tablet Take 1 tablet by mouth twice daily (Patient taking differently: Take 40 mg by mouth 2 (two) times daily before a meal. ) 180 tablet 1   Current Facility-Administered Medications  Medication Dose Route Frequency Provider Last Rate Last Admin  . fexofenadine (ALLEGRA) tablet 180 mg   180 mg Oral Daily Martyn Ehrich, NP        Allergies as of 03/31/2020 - Review Complete 03/22/2020  Allergen Reaction Noted  . Ibuprofen Other (See Comments) 11/04/2011  . Influenza vaccines Shortness Of Breath and Other (See Comments) 05/12/2014  . Tylenol [acetaminophen] Other (See Comments) 05/02/2012  . Bee venom Swelling 06/11/2015  . Tramadol Itching 04/10/2014    Family History  Problem Relation Age of Onset  . Leukemia Father 74  . Colon polyps Father   . Clotting disorder Father   . Seizures Mother   . Irritable bowel syndrome Mother   . Bipolar disorder Brother   . ADD / ADHD Brother   . Diabetes Paternal Grandmother   . Ulcerative colitis Paternal Aunt   . Colon cancer Neg Hx   . Liver cancer Neg Hx   . Esophageal cancer Neg Hx     Social History   Socioeconomic History  . Marital status: Married    Spouse name: Not on file  . Number of children: 2  . Years of education: Not on file  . Highest education level: Not on file  Occupational History  . Occupation: Chief Financial Officer: [employer]  . Occupation: Hydrologist  Tobacco Use  . Smoking status: Never Smoker  . Smokeless tobacco: Current User    Types: Snuff  Substance and Sexual Activity  . Alcohol use: Not Currently    Alcohol/week: 0.0 standard drinks    Comment: rare  . Drug use: No  . Sexual activity: Not Currently  Other Topics Concern  . Not on file  Social History Narrative  . Not on file   Social Determinants of Health   Financial Resource Strain:   . Difficulty of Paying Living Expenses:   Food Insecurity:   . Worried About Charity fundraiser in the Last Year:   . Arboriculturist in the Last Year:   Transportation Needs:   . Film/video editor (Medical):   [name] Kitchen Lack of Transportation (Non-Medical):   Physical Activity:   . Days of Exercise per Week:   . Minutes of Exercise per Session:   Stress:   . Feeling of Stress :   Social Connections:   .  Frequency of Communication with Friends and Family:   . Frequency of Social Gatherings with Friends and Family:   . Attends Religious Services:   . Active Member of Clubs or Organizations:   . Attends Club  or Organization Meetings:   [name] Kitchen Marital Status:   Intimate Partner Violence:   . Fear of Current or Ex-Partner:   . Emotionally Abused:   [name] Kitchen Physically Abused:   . Sexually Abused:     Review of Systems:    Constitutional: No weight loss, fever or chills Cardiovascular: No chest pain   Respiratory: No SOB  Gastrointestinal: See HPI and otherwise negative   Physical Exam:  Vital signs: BP (!) 110/56 (BP Location: Left Arm, Patient Position: Sitting, Cuff Size: Large)   Pulse (!) 104   Temp 98.7 F (37.1 C)   Ht 5\' 9"  (1.753 m) Comment: height measured without shoes  Wt (!) 410 lb (186 kg)   BMI 60.55 kg/m   Constitutional:   Pleasant morbidly obese Caucasian male appears to be in NAD, Well developed, Well nourished, alert and cooperative Respiratory: Respirations even and unlabored. Lungs clear to auscultation bilaterally.   No wheezes, crackles, or rhonchi.  Cardiovascular: Normal S1, S2. No MRG. Regular rate and rhythm. No peripheral edema, cyanosis or pallor.  Gastrointestinal:  Soft, nondistended, nontender. No rebound or guarding. Normal bowel sounds. No appreciable masses or hepatomegaly. Rectal:  Not performed.  Psychiatric:  Demonstrates good judgement and reason without abnormal affect or behaviors.  RELEVANT LABS AND IMAGING: CBC    Component Value Date/Time   WBC 11.5 (H) 03/22/2020 1532   RBC 5.34 03/22/2020 1532   HGB 15.1 03/22/2020 1532   HGB 15.3 03/01/2020 0840   HCT 45.8 03/22/2020 1532   HCT 46.1 03/01/2020 0840   PLT 300 03/22/2020 1532   PLT 293 03/01/2020 0840   MCV 85.8 03/22/2020 1532   MCV 86 03/01/2020 0840   MCH 28.3 03/22/2020 1532   MCHC 33.0 03/22/2020 1532   RDW 14.3 03/22/2020 1532   RDW 14.3 03/01/2020 0840   LYMPHSABS 3.2  03/22/2020 1532   LYMPHSABS 2.4 03/01/2020 0840   MONOABS 0.7 03/22/2020 1532   EOSABS 0.1 03/22/2020 1532   EOSABS 0.1 03/01/2020 0840   BASOSABS 0.0 03/22/2020 1532   BASOSABS 0.1 03/01/2020 0840    CMP     Component Value Date/Time   NA 137 03/22/2020 1532   NA 140 12/01/2019 1025   K 3.7 03/22/2020 1532   CL 102 03/22/2020 1532   CO2 24 03/22/2020 1532   GLUCOSE 87 03/22/2020 1532   BUN 12 03/22/2020 1532   BUN 7 12/01/2019 1025   CREATININE 0.92 03/22/2020 1532   CALCIUM 9.0 03/22/2020 1532   PROT 7.0 03/22/2020 1532   PROT 6.5 12/01/2019 1025   ALBUMIN 4.4 03/22/2020 1532   ALBUMIN 4.1 12/01/2019 1025   AST 23 03/22/2020 1532   ALT 32 03/22/2020 1532   ALKPHOS 56 03/22/2020 1532   BILITOT 1.0 03/22/2020 1532   BILITOT 0.4 12/01/2019 1025   GFRNONAA >60 03/22/2020 1532   GFRAA >60 03/22/2020 1532    Assessment: 1.  IBS-D: Uncontrolled on Bentyl 10 mg 4 times daily 2.  GERD: Uncontrolled on Pantoprazole 40 twice a day; likely related to weight and diet 3.  Bright red blood per rectum: Recent colonoscopy December 2020, patient is known to have hemorrhoids which were thought to be source of bleeding  Plan: 1.  Increased Bentyl to 20 mg 4 times daily, 20 to 30 minutes before meals and at bedtime #120 with 5 refills. 2.  Continue Pantoprazole 40 mg twice daily.  Added Pepcid 40 mg twice daily, every morning and nightly #60 with 5 refills 3.  Will  make the patient a follow-up appoint with Dr. Bryan Lemma specifically to discuss TIF procedure.  Ellouise Newer, PA-C [facility] 03/31/2020, 1:22 PM  Cc: [name], DO

## 2020-04-03 NOTE — Progress Notes (Signed)
Agree with the assessment and plan as outlined by Ellouise Newer, PA-C. However, no plan for TIF given morbid obesity (BMI >60), and TIF not indicated for patients with BMI >35. If ongoing reflux despite addition of H2RA to the high dose PPI, would be more prudent to consider referral to Bariatric Surgery.   Also, would change repeat interval for colonoscopy to 5 years given prior SSP (2025) and per current guidelines.   Edison Nicholson, DO, [facility]

## 2020-04-05 ENCOUNTER — Telehealth: Payer: Self-pay | Admitting: Internal Medicine

## 2020-04-05 DIAGNOSIS — G4733 Obstructive sleep apnea (adult) (pediatric): Secondary | ICD-10-CM

## 2020-04-05 NOTE — Telephone Encounter (Signed)
Spoke with [place]. She stated that the patient was established with [facility] before in the past but he has switched insurance companies. [facility] is no longer in network. They do not have a preference as long as it is sent to either [facility], [facility] or [facility].   I advised her that I would go ahead and place the order since [facility] had stated that his CPAP supplies would be renewed last month ( no order was placed ). She verbalized understanding.   Order has been placed. Nothing further needed at time of call.

## 2020-04-12 ENCOUNTER — Ambulatory Visit: Payer: 59 | Admitting: Gastroenterology

## 2020-04-12 ENCOUNTER — Ambulatory Visit (INDEPENDENT_AMBULATORY_CARE_PROVIDER_SITE_OTHER): Payer: 59 | Admitting: Family Medicine

## 2020-04-12 DIAGNOSIS — F3177 Bipolar disorder, in partial remission, most recent episode mixed: Secondary | ICD-10-CM | POA: Diagnosis not present

## 2020-04-12 DIAGNOSIS — K922 Gastrointestinal hemorrhage, unspecified: Secondary | ICD-10-CM | POA: Diagnosis not present

## 2020-04-12 DIAGNOSIS — F902 Attention-deficit hyperactivity disorder, combined type: Secondary | ICD-10-CM

## 2020-04-12 MED ORDER — AMPHETAMINE-DEXTROAMPHET ER 20 MG PO CP24: 20.0000 mg | ORAL_CAPSULE | ORAL | 0 refills | Status: DC

## 2020-04-12 MED ORDER — AMPHETAMINE-DEXTROAMPHET ER 20 MG PO CP24: 20.0000 mg | ORAL_CAPSULE | Freq: Every morning | ORAL | 0 refills | Status: DC

## 2020-04-12 MED ORDER — LAMOTRIGINE 25 MG PO TABS: 50.0000 mg | ORAL_TABLET | Freq: Two times a day (BID) | ORAL | 2 refills | Status: DC

## 2020-04-12 NOTE — Progress Notes (Signed)
Telephone visit  Subjective: CC: f/u ADHD, bipolar disorder PCP: [name], DO [id number] [name] is a 34 y.o. male calls for telephone consult today. Patient provides verbal consent for consult held via phone.  Due to COVID-19 pandemic this visit was conducted virtually. This visit type was conducted due to national recommendations for restrictions regarding the COVID-19 Pandemic (e.g. social distancing, sheltering in place) in an effort to limit this patient's exposure and mitigate transmission in our community. All issues noted in this document were discussed and addressed.  A physical exam was not performed with this format.   Location of patient: [place] Location of provider: [facility] Others present for call: none  1. ADHD, inattentive; Bipolar disorder Patient reports an excellent response both the Adderall and the increased dose of Lamictal.  Does not report any rashes.  No heart palpitations, tremor, constipation, dry mouth.  Concentration improved.  Mood improved with Lamictal.  Does not report any adverse side effects.  2.  GI bleed Patient reports ongoing GI bleed, abdominal pain.  He was seen in the emergency department for this.  He was able to see his gastroenterologist office a couple of days ago and he had medication adjustment at that time.  He continues to have some abdominal pain and GI bleed.  There was discussion of possibly needing intervention for internal hemorrhoids.  He has an appointment with the physician in about a month but is uncertain that he can wait that long.  He is going to try and arrange for a sooner appointment should somebody cancel.  Does not report any dizziness or shortness of breath.   ROS: Per HPI  Allergies  Allergen Reactions  . Ibuprofen Other (See Comments)    Makes ulcers bleed  . Influenza Vaccines Shortness Of Breath and Other (See Comments)    Rash and unable to breathe well  . Tylenol [Acetaminophen] Other (See Comments)     Bleeding ulcers  . Bee Venom Swelling    SWELLING REACTION UNSPECIFIED   . Tramadol Itching   Past Medical History:  Diagnosis Date  . ADHD (attention deficit hyperactivity disorder)   . Anal fissure   . Anxiety   . Bipolar disorder ([place])   . Chronic lower back pain   . Depression   . Esophagitis    Distal esophageal erosions consistent with mild erosive  reflux esophagitis 12/2008 by EGD   . External hemorrhoids   . Gastric polyps   . Gastric ulcer   . GERD (gastroesophageal reflux disease)   . Hepatic steatosis   . Hiatal hernia   . History of stomach ulcers   . Hx MRSA infection 8/08   right thigh  . Hx of colonic polyps   . Hypercholesteremia    denies  . Hypothyroidism   . Internal hemorrhoids   . Irritable bowel syndrome (IBS)   . Obesity   . Occult GI bleeding 12/2008   Trivial upper GI bleed/uncontrolled GERD by upper endoscopy 12/2008, normal f/u endoscopy 03/2009 with SBCE at that time  . OSA on CPAP   . Pain management   . Pneumonia 09/01/2015   "on ATB still" (09/04/2015)  . S/P colonoscopy 11/10, 10/08   Dr [name]  . Schizophrenia [facility])   . Sleep apnea   . Thyroid function test abnormal    Noted in 2011 discharge  . Tobacco dipper   . Wears contact lenses     Current Outpatient Medications:  .  amphetamine-dextroamphetamine (ADDERALL XR) 20 MG 24  hr capsule, Take 1 capsule (20 mg total) by mouth in the morning., Disp: 30 capsule, Rfl: 0 .  cyclobenzaprine (FLEXERIL) 10 MG tablet, TAKE 1 TABLET BY MOUTH THREE TIMES DAILY AS NEEDED FOR MUSCLE SPASMS (Patient taking differently: Take 10 mg by mouth 3 (three) times daily. ), Disp: 90 tablet, Rfl: 2 .  dicyclomine (BENTYL) 20 MG tablet, Take 1 tablet (20 mg total) by mouth 4 (four) times daily -  before meals and at bedtime., Disp: 120 tablet, Rfl: 5 .  famotidine (PEPCID) 40 MG tablet, Take 1 tablet (40 mg total) by mouth 2 (two) times daily., Disp: 60 tablet, Rfl: 5 .  fluticasone (FLONASE) 50 MCG/ACT  nasal spray, Place 1 spray into both nostrils daily. (Patient taking differently: Place 2 sprays into both nostrils daily. ), Disp: 16 g, Rfl: 2 .  gabapentin (NEURONTIN) 300 MG capsule, Take 1 capsule (300 mg total) by mouth 3 (three) times daily., Disp: 90 capsule, Rfl: 5 .  lamoTRIgine (LAMICTAL) 25 MG tablet, Take 2 tablets (50 mg total) by mouth 2 (two) times daily., Disp: 120 tablet, Rfl: 0 .  levothyroxine (SYNTHROID) 75 MCG tablet, Take 1 tablet (75 mcg total) by mouth daily before breakfast., Disp: 90 tablet, Rfl: 3 .  ondansetron (ZOFRAN ODT) 4 MG disintegrating tablet, Take 1 tablet (4 mg total) by mouth every 8 (eight) hours as needed for nausea or vomiting., Disp: 20 tablet, Rfl: 0 .  pantoprazole (PROTONIX) 40 MG tablet, Take 1 tablet by mouth twice daily (Patient taking differently: Take 40 mg by mouth 2 (two) times daily before a meal. ), Disp: 180 tablet, Rfl: 1  Current Facility-Administered Medications:  .  fexofenadine (ALLEGRA) tablet 180 mg, 180 mg, Oral, Daily, Martyn Ehrich, NP  Assessment/ Plan: 34 y.o. male   1. Attention deficit hyperactivity disorder (ADHD), combined type Was doing well.  Currently, out of medication.  The Narcotic Database has been reviewed.  There were no red flags.   - amphetamine-dextroamphetamine (ADDERALL XR) 20 MG 24 hr capsule; Take 1 capsule (20 mg total) by mouth in the morning.  Dispense: 30 capsule; Refill: 0 - amphetamine-dextroamphetamine (ADDERALL XR) 20 MG 24 hr capsule; Take 1 capsule (20 mg total) by mouth every morning.  Dispense: 30 capsule; Refill: 0 - amphetamine-dextroamphetamine (ADDERALL XR) 20 MG 24 hr capsule; Take 1 capsule (20 mg total) by mouth every morning.  Dispense: 30 capsule; Refill: 0  2. Bipolar disorder, in partial remission, most recent episode mixed (HCC) Stable. Plan for CBC, CMP in August. - lamoTRIgine (LAMICTAL) 25 MG tablet; Take 2 tablets (50 mg total) by mouth 2 (two) times daily.  Dispense: 120  tablet; Refill: 2  3. Gastrointestinal hemorrhage, unspecified gastrointestinal hemorrhage type Waiting for hemorrhoid procedure.  Currently still having some abdominal pain.  Start time: 8:55am End time: 9:03am  Total time spent on patient care (including telephone call/ virtual visit): 15 minutes  [place], [place] [phone]

## 2020-04-28 ENCOUNTER — Telehealth: Payer: Self-pay | Admitting: Family Medicine

## 2020-04-28 NOTE — Telephone Encounter (Signed)
Aware , may need to go to urgent care due to no appointments available.

## 2020-04-28 NOTE — Telephone Encounter (Signed)
  Incoming Patient Call  04/28/2020  What symptoms do you have? Coughing up green stuff and blowing out green stuff from nose  How long have you been sick? About two weeks  Have you been seen for this problem? no  If your provider decides to give you a prescription, which pharmacy would you like for it to be sent to? Would like antibiotic sent to [facility]   Patient informed that this information will be sent to the clinical staff for review and that they should receive a follow up call.

## 2020-04-28 NOTE — Telephone Encounter (Signed)
Advise on request.

## 2020-04-28 NOTE — Telephone Encounter (Signed)
As per office policy, needs OV.  Please place on same day schedule.

## 2020-05-19 ENCOUNTER — Encounter: Payer: Self-pay | Admitting: Gastroenterology

## 2020-05-19 ENCOUNTER — Ambulatory Visit (INDEPENDENT_AMBULATORY_CARE_PROVIDER_SITE_OTHER): Payer: 59 | Admitting: Gastroenterology

## 2020-05-19 VITALS — BP 104/70 | HR 100 | Ht 69.0 in | Wt >= 6400 oz

## 2020-05-19 DIAGNOSIS — K219 Gastro-esophageal reflux disease without esophagitis: Secondary | ICD-10-CM | POA: Diagnosis not present

## 2020-05-19 DIAGNOSIS — K760 Fatty (change of) liver, not elsewhere classified: Secondary | ICD-10-CM

## 2020-05-19 DIAGNOSIS — Z6841 Body Mass Index (BMI) 40.0 and over, adult: Secondary | ICD-10-CM

## 2020-05-19 DIAGNOSIS — K580 Irritable bowel syndrome with diarrhea: Secondary | ICD-10-CM

## 2020-05-19 DIAGNOSIS — R142 Eructation: Secondary | ICD-10-CM

## 2020-05-19 DIAGNOSIS — K649 Unspecified hemorrhoids: Secondary | ICD-10-CM

## 2020-05-19 NOTE — Progress Notes (Signed)
P  Chief Complaint:    Rectal bleeding, GERD  GI History: 34 year old Caucasian male with past medical history as listed below including morbid obesity, IBS, GERD, anxiety, bipolar, depression, ADHD, OSA on CPAP, follows in the GI clinic for the following:  1) IBS-D: Lower abdominal pain and diarrhea.  EGD/colonoscopy with random rectal biopsies otherwise unremarkable.  Improved, but still breakthrough with current therapy-Bentyl 20 mg QID as needed.  Previously discussed the role TCA.  Has been on multiple SSRIs in the past.  2) Hemorrhoids: Small grade 2 internal hemorrhoids noted on colonoscopy 10/2019.  Intermittent bleeding.  3) GERD with mild esophagitis: Longstanding history of reflux, index symptoms of heartburn, regurgitation.  HB and regurgitation generally well controlled with Protonix 40 mg BID, but still with breakthrough atypical symptoms (sour brash, belching).  No change with Pepcid 40 mg BID-stopping today  4) Fatty liver disease: -RUQ [place] (08/2019): Fatty liver infiltration c/w hepatic steatosis -CT (03/2020): Mild hepatomegaly 22 cm without focal lesion -Normal liver enzymes  Endoscopic history: -EGD (08/2007, Dr. Leonides Schanz): Small hiatal hernia -Colonoscopy (08/2007, Dr. Gala Romney): Normal -EGD (12/2008, Dr. Gala Romney): Distal esophageal erosions consistent with mild erosive reflux esophagitis, small hiatal hernia -EGD (03/2009, Dr. Gala Romney): Small hiatal hernia, otherwise normal -VCE (03/2009, Dr.Rourk): Normal -Colonoscopy (09/2009): Hypertrophied anal papilla, otherwise normal -Colonoscopy (11/02/2019, Dr. Bryan Lemma): Hemorrhoids, 2 mm sessile serrated polyp in ascending colon.  Repeat in 5 years -EGD (11/02/2019, Dr. Bryan Lemma): Irregular Z-line (path: Reflux changes, no Barrett's), benign gastric polyps, mild gastritis  Aside from father with colon polyps, no known family history of CRC, GI malignancy, liver disease, pancreatic disease, or IBD  HPI:     Patient is a 34  y.o. male presenting to the Gastroenterology Clinic for follow-up.  Was seen in the ED on 03/22/2020 with generalized abdominal pain.  WBC 11.5, otherwise normal CMP and CBC.  CT with mild hepatomegaly, but otherwise normal.  Was seen in follow-up by Ellouise Newer on 03/31/2020.  Increase Bentyl to 20 mg, continue Protonix and added Pepcid 40 mg BID.  Today, he states he continues to have intermittent rectal bleeding, but now near daily- marroon colored clots and small BRB in toilet water.   Reflux sxs largely improved with Protonix 40 mg BID, but still with "sulfur taste/smell" and belching which she reports to be quite bothersome. HB/regurgitation largely controlled though, and when it does occur, is less pronounced. No real change since adding Pepcid (stopping today).   Reports that his IBS symptoms have improved with Bentyl and fiber bars, but still with intermittent cramping. Stools still soft to loose, but improving.    Review of systems:     No chest pain, no SOB, no fevers, no urinary sx   Past Medical History:  Diagnosis Date  . ADHD (attention deficit hyperactivity disorder)   . Anal fissure   . Anxiety   . Bipolar disorder ([place])   . Chronic lower back pain   . Depression   . Esophagitis    Distal esophageal erosions consistent with mild erosive  reflux esophagitis 12/2008 by EGD   . External hemorrhoids   . Gastric polyps   . Gastric ulcer   . GERD (gastroesophageal reflux disease)   . Hepatic steatosis   . Hiatal hernia   . History of stomach ulcers   . Hx MRSA infection 8/08   right thigh  . Hx of colonic polyps   . Hypercholesteremia    denies  . Hypothyroidism   . Internal  hemorrhoids   . Irritable bowel syndrome (IBS)   . Obesity   . Occult GI bleeding 12/2008   Trivial upper GI bleed/uncontrolled GERD by upper endoscopy 12/2008, normal f/u endoscopy 03/2009 with SBCE at that time  . OSA on CPAP   . Pain management   . Pneumonia 09/01/2015   "on ATB still"  (09/04/2015)  . S/P colonoscopy 11/10, 10/08   Dr [name]  . Schizophrenia [facility])   . Sleep apnea   . Thyroid function test abnormal    Noted in 2011 discharge  . Tobacco dipper   . Wears contact lenses     Patient's surgical history, family medical history, social history, medications and allergies were all reviewed in Epic    Current Outpatient Medications  Medication Sig Dispense Refill  . amphetamine-dextroamphetamine (ADDERALL XR) 20 MG 24 hr capsule Take 1 capsule (20 mg total) by mouth in the morning. 30 capsule 0  . cyclobenzaprine (FLEXERIL) 10 MG tablet TAKE 1 TABLET BY MOUTH THREE TIMES DAILY AS NEEDED FOR MUSCLE SPASMS (Patient taking differently: Take 10 mg by mouth 3 (three) times daily. ) 90 tablet 2  . dicyclomine (BENTYL) 20 MG tablet Take 1 tablet (20 mg total) by mouth 4 (four) times daily -  before meals and at bedtime. 120 tablet 5  . EPINEPHrine 0.15 MG/0.15ML IJ injection Inject 0.15 mg into the muscle as needed for anaphylaxis.    . famotidine (PEPCID) 40 MG tablet Take 1 tablet (40 mg total) by mouth 2 (two) times daily. 60 tablet 5  . fexofenadine (ALLEGRA) 180 MG tablet Take 180 mg by mouth as needed for allergies or rhinitis.    . fluticasone (FLONASE) 50 MCG/ACT nasal spray Place 1 spray into both nostrils daily. (Patient taking differently: Place 2 sprays into both nostrils daily. ) 16 g 2  . gabapentin (NEURONTIN) 300 MG capsule Take 1 capsule (300 mg total) by mouth 3 (three) times daily. 90 capsule 5  . lamoTRIgine (LAMICTAL) 25 MG tablet Take 2 tablets (50 mg total) by mouth 2 (two) times daily. 120 tablet 2  . levothyroxine (SYNTHROID) 75 MCG tablet Take 1 tablet (75 mcg total) by mouth daily before breakfast. 90 tablet 3  . ondansetron (ZOFRAN ODT) 4 MG disintegrating tablet Take 1 tablet (4 mg total) by mouth every 8 (eight) hours as needed for nausea or vomiting. 20 tablet 0  . pantoprazole (PROTONIX) 40 MG tablet Take 1 tablet by mouth twice  daily (Patient taking differently: Take 40 mg by mouth 2 (two) times daily before a meal. ) 180 tablet 1   No current facility-administered medications for this visit.    Physical Exam:     BP 104/70   Pulse 100   Ht 5\' 9"  (1.753 m)   Wt (!) 406 lb 4 oz (184.3 kg)   BMI 59.99 kg/m   GENERAL:  Pleasant male in NAD PSYCH: : Cooperative, normal affect NEURO: Alert and oriented x 3, no focal neurologic deficits   IMPRESSION and PLAN:    1) GERD 2) Waterbrash 3) Belching  Reflux generally well controlled with high-dose PPI, but with ongoing breakthrough of atypical symptoms, which she finds to be bothersome.  Discussed pathophysiology of reflux at length today, to include ongoing nonacid reflux.  Discussed association with his underlying morbid obesity, and will proceed as follows:  -Trial course of Dexilant.  Provided with samples today -Holding Protonix while trialing Dexilant, then can resume at 40 mg bid -Can stop Pepcid as he  found no added clinical benefit - EGD with Bravo at [facility]- ON PPI, to evaluate for breakthrough reflux -Briefly discussed potential referral to Bariatric Surgery  4) IBS-D: - Provided handout on low FODMAP diet with detailed explanation - Ensure intake of at least 8 glasses of 8 ounces of fluid/water per day - Okay to take Imodium as needed, particularly with situational exacerbation of symptoms.  Depending on response to therapy and ongoing need for medical intervention, can consider trial of Motofen  - Increase dietary fiber and add fiber supplement (i.e. Citrucel, Benefiber, Metamucil) for goal of 25 g of fiber per day with goal for soft, formed stools without straining to have a BM.  Provided with fiber chart today -Continue Bentyl -Start glutamine 5 g 3 times daily in IBS patients  5) Internal hemorrhoids: -Evaluate for clinical improvement with the above intervention for underlying IBS-D.  If ongoing symptomatic hemorrhoids, he will return to clinic  for hemorrhoid banding  6) Morbid obesity (BMI 60) -Procedures to be scheduled at [facility] -As above, discussed potential referral to Bariatric Surgery  7) NAFLD: -Normal liver enzymes, fatty infiltration on imaging -Previously discussed diagnosis.  Conservative management for now  RTC in 6 months or sooner as needed  The indications, risks, and benefits of EGD with Bravo placement were explained to the patient in detail. Risks include but are not limited to bleeding, perforation, adverse reaction to medications, and cardiopulmonary compromise. Sequelae include but are not limited to the possibility of surgery, hositalization, and mortality. The patient verbalized understanding and wished to proceed. All questions answered, referred to scheduler. Further recommendations pending results of the exam.             [place] ,DO, FACG 05/19/2020, 9:02 AM

## 2020-05-19 NOTE — H&P (View-Only) (Signed)
P  Chief Complaint:    Rectal bleeding, GERD  GI History: 34 year old Caucasian male with past medical history as listed below including morbid obesity, IBS, GERD, anxiety, bipolar, depression, ADHD, OSA on CPAP, follows in the GI clinic for the following:  1) IBS-D: Lower abdominal pain and diarrhea.  EGD/colonoscopy with random rectal biopsies otherwise unremarkable.  Improved, but still breakthrough with current therapy-Bentyl 20 mg QID as needed.  Previously discussed the role TCA.  Has been on multiple SSRIs in the past.  2) Hemorrhoids: Small grade 2 internal hemorrhoids noted on colonoscopy 10/2019.  Intermittent bleeding.  3) GERD with mild esophagitis: Longstanding history of reflux, index symptoms of heartburn, regurgitation.  HB and regurgitation generally well controlled with Protonix 40 mg BID, but still with breakthrough atypical symptoms (sour brash, belching).  No change with Pepcid 40 mg BID-stopping today  4) Fatty liver disease: -RUQ [place] (08/2019): Fatty liver infiltration c/w hepatic steatosis -CT (03/2020): Mild hepatomegaly 22 cm without focal lesion -Normal liver enzymes  Endoscopic history: -EGD (08/2007, Dr. Leonides Schanz): Small hiatal hernia -Colonoscopy (08/2007, Dr. Gala Romney): Normal -EGD (12/2008, Dr. Gala Romney): Distal esophageal erosions consistent with mild erosive reflux esophagitis, small hiatal hernia -EGD (03/2009, Dr. Gala Romney): Small hiatal hernia, otherwise normal -VCE (03/2009, Dr.Rourk): Normal -Colonoscopy (09/2009): Hypertrophied anal papilla, otherwise normal -Colonoscopy (11/02/2019, Dr. Bryan Lemma): Hemorrhoids, 2 mm sessile serrated polyp in ascending colon.  Repeat in 5 years -EGD (11/02/2019, Dr. Bryan Lemma): Irregular Z-line (path: Reflux changes, no Barrett's), benign gastric polyps, mild gastritis  Aside from father with colon polyps, no known family history of CRC, GI malignancy, liver disease, pancreatic disease, or IBD  HPI:     Patient is a 34  y.o. male presenting to the Gastroenterology Clinic for follow-up.  Was seen in the ED on 03/22/2020 with generalized abdominal pain.  WBC 11.5, otherwise normal CMP and CBC.  CT with mild hepatomegaly, but otherwise normal.  Was seen in follow-up by Ellouise Newer on 03/31/2020.  Increase Bentyl to 20 mg, continue Protonix and added Pepcid 40 mg BID.  Today, he states he continues to have intermittent rectal bleeding, but now near daily- marroon colored clots and small BRB in toilet water.   Reflux sxs largely improved with Protonix 40 mg BID, but still with "sulfur taste/smell" and belching which she reports to be quite bothersome. HB/regurgitation largely controlled though, and when it does occur, is less pronounced. No real change since adding Pepcid (stopping today).   Reports that his IBS symptoms have improved with Bentyl and fiber bars, but still with intermittent cramping. Stools still soft to loose, but improving.    Review of systems:     No chest pain, no SOB, no fevers, no urinary sx   Past Medical History:  Diagnosis Date  . ADHD (attention deficit hyperactivity disorder)   . Anal fissure   . Anxiety   . Bipolar disorder ([place])   . Chronic lower back pain   . Depression   . Esophagitis    Distal esophageal erosions consistent with mild erosive  reflux esophagitis 12/2008 by EGD   . External hemorrhoids   . Gastric polyps   . Gastric ulcer   . GERD (gastroesophageal reflux disease)   . Hepatic steatosis   . Hiatal hernia   . History of stomach ulcers   . Hx MRSA infection 8/08   right thigh  . Hx of colonic polyps   . Hypercholesteremia    denies  . Hypothyroidism   . Internal  hemorrhoids   . Irritable bowel syndrome (IBS)   . Obesity   . Occult GI bleeding 12/2008   Trivial upper GI bleed/uncontrolled GERD by upper endoscopy 12/2008, normal f/u endoscopy 03/2009 with SBCE at that time  . OSA on CPAP   . Pain management   . Pneumonia 09/01/2015   "on ATB still"  (09/04/2015)  . S/P colonoscopy 11/10, 10/08   Dr [name]  . Schizophrenia [facility])   . Sleep apnea   . Thyroid function test abnormal    Noted in 2011 discharge  . Tobacco dipper   . Wears contact lenses     Patient's surgical history, family medical history, social history, medications and allergies were all reviewed in Epic    Current Outpatient Medications  Medication Sig Dispense Refill  . amphetamine-dextroamphetamine (ADDERALL XR) 20 MG 24 hr capsule Take 1 capsule (20 mg total) by mouth in the morning. 30 capsule 0  . cyclobenzaprine (FLEXERIL) 10 MG tablet TAKE 1 TABLET BY MOUTH THREE TIMES DAILY AS NEEDED FOR MUSCLE SPASMS (Patient taking differently: Take 10 mg by mouth 3 (three) times daily. ) 90 tablet 2  . dicyclomine (BENTYL) 20 MG tablet Take 1 tablet (20 mg total) by mouth 4 (four) times daily -  before meals and at bedtime. 120 tablet 5  . EPINEPHrine 0.15 MG/0.15ML IJ injection Inject 0.15 mg into the muscle as needed for anaphylaxis.    . famotidine (PEPCID) 40 MG tablet Take 1 tablet (40 mg total) by mouth 2 (two) times daily. 60 tablet 5  . fexofenadine (ALLEGRA) 180 MG tablet Take 180 mg by mouth as needed for allergies or rhinitis.    . fluticasone (FLONASE) 50 MCG/ACT nasal spray Place 1 spray into both nostrils daily. (Patient taking differently: Place 2 sprays into both nostrils daily. ) 16 g 2  . gabapentin (NEURONTIN) 300 MG capsule Take 1 capsule (300 mg total) by mouth 3 (three) times daily. 90 capsule 5  . lamoTRIgine (LAMICTAL) 25 MG tablet Take 2 tablets (50 mg total) by mouth 2 (two) times daily. 120 tablet 2  . levothyroxine (SYNTHROID) 75 MCG tablet Take 1 tablet (75 mcg total) by mouth daily before breakfast. 90 tablet 3  . ondansetron (ZOFRAN ODT) 4 MG disintegrating tablet Take 1 tablet (4 mg total) by mouth every 8 (eight) hours as needed for nausea or vomiting. 20 tablet 0  . pantoprazole (PROTONIX) 40 MG tablet Take 1 tablet by mouth twice  daily (Patient taking differently: Take 40 mg by mouth 2 (two) times daily before a meal. ) 180 tablet 1   No current facility-administered medications for this visit.    Physical Exam:     BP 104/70   Pulse 100   Ht 5\' 9"  (1.753 m)   Wt (!) 406 lb 4 oz (184.3 kg)   BMI 59.99 kg/m   GENERAL:  Pleasant male in NAD PSYCH: : Cooperative, normal affect NEURO: Alert and oriented x 3, no focal neurologic deficits   IMPRESSION and PLAN:    1) GERD 2) Waterbrash 3) Belching  Reflux generally well controlled with high-dose PPI, but with ongoing breakthrough of atypical symptoms, which she finds to be bothersome.  Discussed pathophysiology of reflux at length today, to include ongoing nonacid reflux.  Discussed association with his underlying morbid obesity, and will proceed as follows:  -Trial course of Dexilant.  Provided with samples today -Holding Protonix while trialing Dexilant, then can resume at 40 mg bid -Can stop Pepcid as he  found no added clinical benefit - EGD with Bravo at [facility]- ON PPI, to evaluate for breakthrough reflux -Briefly discussed potential referral to Bariatric Surgery  4) IBS-D: - Provided handout on low FODMAP diet with detailed explanation - Ensure intake of at least 8 glasses of 8 ounces of fluid/water per day - Okay to take Imodium as needed, particularly with situational exacerbation of symptoms.  Depending on response to therapy and ongoing need for medical intervention, can consider trial of Motofen  - Increase dietary fiber and add fiber supplement (i.e. Citrucel, Benefiber, Metamucil) for goal of 25 g of fiber per day with goal for soft, formed stools without straining to have a BM.  Provided with fiber chart today -Continue Bentyl -Start glutamine 5 g 3 times daily in IBS patients  5) Internal hemorrhoids: -Evaluate for clinical improvement with the above intervention for underlying IBS-D.  If ongoing symptomatic hemorrhoids, he will return to clinic  for hemorrhoid banding  6) Morbid obesity (BMI 60) -Procedures to be scheduled at [facility] -As above, discussed potential referral to Bariatric Surgery  7) NAFLD: -Normal liver enzymes, fatty infiltration on imaging -Previously discussed diagnosis.  Conservative management for now  RTC in 6 months or sooner as needed  The indications, risks, and benefits of EGD with Bravo placement were explained to the patient in detail. Risks include but are not limited to bleeding, perforation, adverse reaction to medications, and cardiopulmonary compromise. Sequelae include but are not limited to the possibility of surgery, hositalization, and mortality. The patient verbalized understanding and wished to proceed. All questions answered, referred to scheduler. Further recommendations pending results of the exam.             [place] ,DO, FACG 05/19/2020, 9:02 AM

## 2020-05-19 NOTE — Patient Instructions (Addendum)
Low FODMAP Diet: (Fermentable Oligosaccharides, Disaccharides, Monosaccharides, and Polyols) These are short chain carbohydrates and sugar alcohols that are poorly absorbed by the body, resulting in multiple abdominal symptoms, including changes in bowel habits, abdominal pain/discomfort, bloating, abdominal distension, gas, etc.     Please start taking a daily fiber supplement such as citrucel, benefiber, metamucil, or fiber choice.  Please purchase the following medications over the counter and take as directed: Glutamine 5 gm 3 times daily We have given you samples of the following medication to take: Dexaliant

## 2020-06-02 ENCOUNTER — Telehealth: Payer: Self-pay | Admitting: Internal Medicine

## 2020-06-02 NOTE — Telephone Encounter (Signed)
Spoke with patient's wife, she is calling about DME referral from 04/05/2020 for CPAP supplies. DME was changed to [facility]. Please advise how triage can assist.

## 2020-06-02 NOTE — Telephone Encounter (Signed)
Spoke to Talbert Cage she stated she was going to research it and call the patient called and made the wife aware

## 2020-06-05 ENCOUNTER — Other Ambulatory Visit (HOSPITAL_COMMUNITY)
Admission: RE | Admit: 2020-06-05 | Discharge: 2020-06-05 | Disposition: A | Discharge status: Home / Self Care | Payer: 59 | Source: Ambulatory Visit | Attending: Gastroenterology | Admitting: Gastroenterology

## 2020-06-05 DIAGNOSIS — Z20822 Contact with and (suspected) exposure to covid-19: Secondary | ICD-10-CM | POA: Insufficient documentation

## 2020-06-05 DIAGNOSIS — Z01812 Encounter for preprocedural laboratory examination: Secondary | ICD-10-CM | POA: Insufficient documentation

## 2020-06-05 LAB — SARS CORONAVIRUS 2 (TAT 6-24 HRS): SARS Coronavirus 2: NEGATIVE

## 2020-06-06 NOTE — Anesthesia Preprocedure Evaluation (Addendum)
Anesthesia Evaluation  Patient identified by MRN, date of birth, ID band Patient awake    Reviewed: Allergy & Precautions, NPO status , Patient's Chart, lab work & pertinent test results  Airway Mallampati: III  TM Distance: >3 FB Neck ROM: Full    Dental no notable dental hx. (+) Poor Dentition   Pulmonary sleep apnea and Continuous Positive Airway Pressure Ventilation ,    Pulmonary exam normal breath sounds clear to auscultation       Cardiovascular Exercise Tolerance: Good negative cardio ROS Normal cardiovascular exam Rhythm:Regular Rate:Normal     Neuro/Psych PSYCHIATRIC DISORDERS Anxiety Depression Bipolar Disorder Schizophrenia    GI/Hepatic hiatal hernia, PUD, GERD  ,  Endo/Other  Hypothyroidism Morbid obesity  Renal/GU      Musculoskeletal   Abdominal (+) + obese,   Peds  Hematology   Anesthesia Other Findings   Reproductive/Obstetrics                            Anesthesia Physical Anesthesia Plan  ASA: III  Anesthesia Plan: MAC   Post-op Pain Management:    Induction:   PONV Risk Score and Plan: Treatment may vary due to age or medical condition  Airway Management Planned: Natural Airway and Nasal Cannula  Additional Equipment: None  Intra-op Plan:   Post-operative Plan:   Informed Consent: I have reviewed the patients History and Physical, chart, labs and discussed the procedure including the risks, benefits and alternatives for the proposed anesthesia with the patient or authorized representative who has indicated his/her understanding and acceptance.     Dental advisory given  Plan Discussed with:   Anesthesia Plan Comments:        Anesthesia Quick Evaluation

## 2020-06-07 ENCOUNTER — Other Ambulatory Visit: Payer: Self-pay

## 2020-06-07 ENCOUNTER — Ambulatory Visit (HOSPITAL_COMMUNITY): Payer: 59 | Admitting: Anesthesiology

## 2020-06-07 ENCOUNTER — Encounter (HOSPITAL_COMMUNITY)
Admission: RE | Disposition: A | Discharge status: Home / Self Care | Payer: Self-pay | Source: Home / Self Care | Attending: Gastroenterology

## 2020-06-07 ENCOUNTER — Ambulatory Visit (HOSPITAL_COMMUNITY)
Admission: RE | Admit: 2020-06-07 | Discharge: 2020-06-07 | Disposition: A | Discharge status: Home / Self Care | Payer: 59 | Attending: Gastroenterology | Admitting: Gastroenterology

## 2020-06-07 ENCOUNTER — Encounter (HOSPITAL_COMMUNITY): Payer: Self-pay | Admitting: Gastroenterology

## 2020-06-07 DIAGNOSIS — Z8614 Personal history of Methicillin resistant Staphylococcus aureus infection: Secondary | ICD-10-CM | POA: Insufficient documentation

## 2020-06-07 DIAGNOSIS — F419 Anxiety disorder, unspecified: Secondary | ICD-10-CM | POA: Insufficient documentation

## 2020-06-07 DIAGNOSIS — K317 Polyp of stomach and duodenum: Secondary | ICD-10-CM | POA: Diagnosis not present

## 2020-06-07 DIAGNOSIS — Z79899 Other long term (current) drug therapy: Secondary | ICD-10-CM | POA: Insufficient documentation

## 2020-06-07 DIAGNOSIS — K589 Irritable bowel syndrome without diarrhea: Secondary | ICD-10-CM | POA: Diagnosis not present

## 2020-06-07 DIAGNOSIS — F209 Schizophrenia, unspecified: Secondary | ICD-10-CM | POA: Diagnosis not present

## 2020-06-07 DIAGNOSIS — F909 Attention-deficit hyperactivity disorder, unspecified type: Secondary | ICD-10-CM | POA: Insufficient documentation

## 2020-06-07 DIAGNOSIS — K228 Other specified diseases of esophagus: Secondary | ICD-10-CM | POA: Diagnosis not present

## 2020-06-07 DIAGNOSIS — K449 Diaphragmatic hernia without obstruction or gangrene: Secondary | ICD-10-CM | POA: Diagnosis not present

## 2020-06-07 DIAGNOSIS — K760 Fatty (change of) liver, not elsewhere classified: Secondary | ICD-10-CM | POA: Insufficient documentation

## 2020-06-07 DIAGNOSIS — K219 Gastro-esophageal reflux disease without esophagitis: Secondary | ICD-10-CM | POA: Diagnosis not present

## 2020-06-07 DIAGNOSIS — K2970 Gastritis, unspecified, without bleeding: Secondary | ICD-10-CM | POA: Diagnosis not present

## 2020-06-07 DIAGNOSIS — K580 Irritable bowel syndrome with diarrhea: Secondary | ICD-10-CM | POA: Diagnosis not present

## 2020-06-07 DIAGNOSIS — F319 Bipolar disorder, unspecified: Secondary | ICD-10-CM | POA: Diagnosis not present

## 2020-06-07 DIAGNOSIS — Z79890 Hormone replacement therapy (postmenopausal): Secondary | ICD-10-CM | POA: Insufficient documentation

## 2020-06-07 DIAGNOSIS — K648 Other hemorrhoids: Secondary | ICD-10-CM | POA: Insufficient documentation

## 2020-06-07 DIAGNOSIS — K2990 Gastroduodenitis, unspecified, without bleeding: Secondary | ICD-10-CM

## 2020-06-07 DIAGNOSIS — E7800 Pure hypercholesterolemia, unspecified: Secondary | ICD-10-CM | POA: Diagnosis not present

## 2020-06-07 DIAGNOSIS — Z6841 Body Mass Index (BMI) 40.0 and over, adult: Secondary | ICD-10-CM | POA: Diagnosis not present

## 2020-06-07 DIAGNOSIS — G4733 Obstructive sleep apnea (adult) (pediatric): Secondary | ICD-10-CM | POA: Diagnosis not present

## 2020-06-07 DIAGNOSIS — K649 Unspecified hemorrhoids: Secondary | ICD-10-CM

## 2020-06-07 DIAGNOSIS — E039 Hypothyroidism, unspecified: Secondary | ICD-10-CM | POA: Diagnosis not present

## 2020-06-07 HISTORY — PX: BIOPSY: SHX5522

## 2020-06-07 HISTORY — PX: ESOPHAGOGASTRODUODENOSCOPY (EGD) WITH PROPOFOL: SHX5813

## 2020-06-07 HISTORY — PX: BRAVO PH STUDY: SHX5421

## 2020-06-07 SURGERY — ESOPHAGOGASTRODUODENOSCOPY (EGD) WITH PROPOFOL
Anesthesia: Monitor Anesthesia Care

## 2020-06-07 MED ORDER — PROPOFOL 500 MG/50ML IV EMUL
INTRAVENOUS | Status: AC
  Filled 2020-06-07: qty 100

## 2020-06-07 MED ORDER — SODIUM CHLORIDE 0.9 % IV SOLN: INTRAVENOUS | Status: DC

## 2020-06-07 MED ORDER — PROPOFOL 10 MG/ML IV BOLUS
INTRAVENOUS | Status: AC
  Filled 2020-06-07: qty 20

## 2020-06-07 MED ORDER — LACTATED RINGERS IV SOLN
INTRAVENOUS | Status: AC | PRN
  Administered 2020-06-07: 1000 mL via INTRAVENOUS

## 2020-06-07 MED ORDER — PROPOFOL 500 MG/50ML IV EMUL
INTRAVENOUS | Status: DC | PRN
  Administered 2020-06-07: 150 ug/kg/min via INTRAVENOUS

## 2020-06-07 MED ORDER — LACTATED RINGERS IV SOLN: INTRAVENOUS | Status: DC | PRN

## 2020-06-07 MED ORDER — PROPOFOL 10 MG/ML IV BOLUS
INTRAVENOUS | Status: DC | PRN
  Administered 2020-06-07: 50 mg via INTRAVENOUS
  Administered 2020-06-07: 20 mg via INTRAVENOUS

## 2020-06-07 MED ORDER — LIDOCAINE 2% (20 MG/ML) 5 ML SYRINGE
INTRAMUSCULAR | Status: DC | PRN
  Administered 2020-06-07: 100 mg via INTRAVENOUS

## 2020-06-07 SURGICAL SUPPLY — 15 items

## 2020-06-07 NOTE — Op Note (Signed)
[facility] Patient Name: [name] General Procedure Date: 06/07/2020 MRN: [id number] Attending MD: Gerrit Heck , MD Date of Birth: 1986/08/01 CSN: [id number] Age: 34 Admit Type: Outpatient Procedure:                Upper GI endoscopy Indications:              Esophageal reflux symptoms that persist despite                            appropriate therapy, Heartburn, Esophageal reflux,                            Follow-up of esophageal reflux, Diarrhea                           EGD for Bravo placement to evaluate for ongoing                            breakthrough reflux despite high dose acid                            suppression therapy. Study being performed ON PPI. Providers:                Gerrit Heck, MD, Glori Bickers, RN, Elspeth Cho Tech., Technician, Caryl Pina CRNA Referring MD:              Medicines:                Monitored Anesthesia Care Complications:            No immediate complications. Estimated Blood Loss:     Estimated blood loss was minimal. Procedure:                Pre-Anesthesia Assessment:                           - Prior to the procedure, a History and Physical                            was performed, and patient medications and                            allergies were reviewed. The patient's tolerance of                            previous anesthesia was also reviewed. The risks                            and benefits of the procedure and the sedation                            options and risks were discussed with the patient.  All questions were answered, and informed consent                            was obtained. Prior Anticoagulants: The patient has                            taken no previous anticoagulant or antiplatelet                            agents. ASA Grade Assessment: III - A patient with                            severe systemic disease. After reviewing  the risks                            and benefits, the patient was deemed in                            satisfactory condition to undergo the procedure.                           After obtaining informed consent, the endoscope was                            passed under direct vision. Throughout the                            procedure, the patient's blood pressure, pulse, and                            oxygen saturations were monitored continuously. The                            GIF-H190 ([id number] Olympus gastroscope was                            introduced through the mouth, and advanced to the                            second part of duodenum. The upper GI endoscopy was                            accomplished without difficulty. The patient                            tolerated the procedure well. Scope In: Scope Out: Findings:      The examined esophagus was normal. The BRAVO capsule with delivery       system was introduced through the mouth and advanced into the esophagus,       such that the BRAVO pH capsule was positioned 35 cm from the incisors,       which was 6 cm proximal to the GE junction. The BRAVO pH capsule was       then deployed and attached to the esophageal  mucosa. The delivery system       was then withdrawn. Endoscopy was utilized for probe placement and       diagnostic evaluation. The scope was reinserted to evaluate placement of       the BRAVO capsule. Visualization showed the BRAVO capsule to be in an       appropriate position.      The Z-line was mildly irregular and was found 41 cm from the incisors.       This was previously biopsied and consistent with reflux changes.      Scattered mild inflammation characterized by erythema was found in the       gastric body and in the gastric antrum. Biopsies were taken with a cold       forceps for Helicobacter pylori testing. Estimated blood loss was       minimal.      A medium amount of food (residue) was found  in the gastric body.      A few small sessile polyps were found in the gastric body. These were       previously sampled and consistent with benign gastric polyps. Additional       sampling not performed today.      The duodenal bulb, first portion of the duodenum and second portion of       the duodenum were normal. Biopsies for histology were taken with a cold       forceps for evaluation of celiac disease. Estimated blood loss was       minimal. Impression:               - Normal esophagus.                           - Z-line very mildly irregular, 41 cm from the                            incisors. This was previously biopsied and                            consistent with reflux changes.                           - Gastritis. Biopsied.                           - A medium amount of food (residue) in the stomach.                           - A few gastric polyps.                           - Normal duodenal bulb, first portion of the                            duodenum and second portion of the duodenum.                            Biopsied.                           -  The BRAVO pH capsule was deployed. Moderate Sedation:      Not Applicable - Patient had care per Anesthesia. Recommendation:           - Patient has a contact number available for                            emergencies. The signs and symptoms of potential                            delayed complications were discussed with the                            patient. Return to normal activities tomorrow.                            Written discharge instructions were provided to the                            patient.                           - Resume previous diet.                           - Continue present medications.                           - Await pathology results.                           - Return Bravo monitoring system as outlined for                            downloading and interpretation.                            - Return to GI clinic at appointment to be                            scheduled to review results of Bravo study. Procedure Code(s):        --- Professional ---                           [phone], Esophagogastroduodenoscopy, flexible,                            transoral; with biopsy, single or multiple Diagnosis Code(s):        --- Professional ---                           K22.8, Other specified diseases of esophagus                           K29.70, Gastritis, unspecified, without bleeding  K31.7, Polyp of stomach and duodenum                           K21.9, Gastro-esophageal reflux disease without                            esophagitis                           R12, Heartburn                           R19.7, Diarrhea, unspecified CPT copyright 2019 American Medical Association. All rights reserved. The codes documented in this report are preliminary and upon coder review may  be revised to meet current compliance requirements. Gerrit Heck, MD 06/07/2020 11:12:28 AM Number of Addenda: 0

## 2020-06-07 NOTE — Discharge Instructions (Signed)
Monitored Anesthesia Care, Care After These instructions provide you with information about caring for yourself after your procedure. Your health care provider may also give you more specific instructions. Your treatment has been planned according to current medical practices, but problems sometimes occur. Call your health care provider if you have any problems or questions after your procedure. What can I expect after the procedure? After your procedure, you may:  Feel sleepy for several hours.  Feel clumsy and have poor balance for several hours.  Feel forgetful about what happened after the procedure.  Have poor judgment for several hours.  Feel nauseous or vomit.  Have a sore throat if you had a breathing tube during the procedure. Follow these instructions at home: For at least 24 hours after the procedure:      Have a responsible adult stay with you. It is important to have someone help care for you until you are awake and alert.  Rest as needed.  Do not: ? Participate in activities in which you could fall or become injured. ? Drive. ? Use heavy machinery. ? Drink alcohol. ? Take sleeping pills or medicines that cause drowsiness. ? Make important decisions or sign legal documents. ? Take care of children on your own. Eating and drinking  Follow the diet that is recommended by your health care provider.  If you vomit, drink water, juice, or soup when you can drink without vomiting.  Make sure you have little or no nausea before eating solid foods. General instructions  Take over-the-counter and prescription medicines only as told by your health care provider.  If you have sleep apnea, surgery and certain medicines can increase your risk for breathing problems. Follow instructions from your health care provider about wearing your sleep device: ? Anytime you are sleeping, including during daytime naps. ? While taking prescription pain medicines, sleeping medicines,  or medicines that make you drowsy.  If you smoke, do not smoke without supervision.  Keep all follow-up visits as told by your health care provider. This is important. Contact a health care provider if:  You keep feeling nauseous or you keep vomiting.  You feel light-headed.  You develop a rash.  You have a fever. Get help right away if:  You have trouble breathing. Summary  For several hours after your procedure, you may feel sleepy and have poor judgment.  Have a responsible adult stay with you for at least 24 hours or until you are awake and alert. This information is not intended to replace advice given to you by your health care provider. Make sure you discuss any questions you have with your health care provider. Document Revised: 02/09/2018 Document Reviewed: 03/03/2016 Elsevier Patient Education  2020 Elsevier Inc. Upper Endoscopy, Adult, Care After This sheet gives you information about how to care for yourself after your procedure. Your health care provider may also give you more specific instructions. If you have problems or questions, contact your health care provider. What can I expect after the procedure? After the procedure, it is common to have:  A sore throat.  Mild stomach pain or discomfort.  Bloating.  Nausea. Follow these instructions at home:   Follow instructions from your health care provider about what to eat or drink after your procedure.  Return to your normal activities as told by your health care provider. Ask your health care provider what activities are safe for you.  Take over-the-counter and prescription medicines only as told by your health care provider.    Do not drive for 24 hours if you were given a sedative during your procedure.  Keep all follow-up visits as told by your health care provider. This is important. Contact a health care provider if you have:  A sore throat that lasts longer than one day.  Trouble swallowing. Get  help right away if:  You vomit blood or your vomit looks like coffee grounds.  You have: ? A fever. ? Bloody, black, or tarry stools. ? A severe sore throat or you cannot swallow. ? Difficulty breathing. ? Severe pain in your chest or abdomen. Summary  After the procedure, it is common to have a sore throat, mild stomach discomfort, bloating, and nausea.  Do not drive for 24 hours if you were given a sedative during the procedure.  Follow instructions from your health care provider about what to eat or drink after your procedure.  Return to your normal activities as told by your health care provider. This information is not intended to replace advice given to you by your health care provider. Make sure you discuss any questions you have with your health care provider. Document Revised: 05/05/2018 Document Reviewed: 04/13/2018 Elsevier Patient Education  2020 Elsevier Inc.  

## 2020-06-07 NOTE — Interval H&P Note (Signed)
History and Physical Interval Note:  06/07/2020 10:20 AM  [name]  has presented today for surgery, with the diagnosis of GERD IBS-D Memorrhoids.  The various methods of treatment have been discussed with the patient and family. After consideration of risks, benefits and other options for treatment, the patient has consented to  Procedure(s): ESOPHAGOGASTRODUODENOSCOPY (EGD) WITH PROPOFOL (N/A) BRAVO PH STUDY on PPI (N/A) as a surgical intervention.  The patient's history has been reviewed, patient examined, no change in status, stable for surgery.  I have reviewed the patient's chart and labs.  Questions were answered to the patient's satisfaction.     Dominic Pea [name]

## 2020-06-07 NOTE — Anesthesia Postprocedure Evaluation (Signed)
Anesthesia Post Note  Patient: [name]  Procedure(s) Performed: ESOPHAGOGASTRODUODENOSCOPY (EGD) WITH PROPOFOL (N/A ) BRAVO PH STUDY on PPI (N/A ) BIOPSY     Patient location during evaluation: Endoscopy Anesthesia Type: MAC Level of consciousness: awake and alert Pain management: pain level controlled Vital Signs Assessment: post-procedure vital signs reviewed and stable Respiratory status: spontaneous breathing, nonlabored ventilation, respiratory function stable and patient connected to nasal cannula oxygen Cardiovascular status: blood pressure returned to baseline and stable Postop Assessment: no apparent nausea or vomiting Anesthetic complications: no   No complications documented.  Last Vitals:  Vitals:   06/07/20 1110 06/07/20 1120  BP: 125/73 (!) 106/51  Pulse: 82 77  Resp: (!) 21 (!) 25  Temp:    SpO2: 97% 98%    Last Pain:  Vitals:   06/07/20 1120  TempSrc:   PainSc: 0-No pain                 Barnet Glasgow

## 2020-06-07 NOTE — Transfer of Care (Signed)
Immediate Anesthesia Transfer of Care Note  Patient: [name]  Procedure(s) Performed: ESOPHAGOGASTRODUODENOSCOPY (EGD) WITH PROPOFOL (N/A ) BRAVO PH STUDY on PPI (N/A ) BIOPSY  Patient Location: Endoscopy Unit  Anesthesia Type:MAC  Level of Consciousness: drowsy, patient cooperative and responds to stimulation  Airway & Oxygen Therapy: Patient Spontanous Breathing and Patient connected to face mask oxygen  Post-op Assessment: Report given to RN, Post -op Vital signs reviewed and stable and Patient moving all extremities  Post vital signs: Reviewed and stable  Last Vitals:  Vitals Value Taken Time  BP    Temp    Pulse 92 06/07/20 1101  Resp 15 06/07/20 1101  SpO2 96 % 06/07/20 1101  Vitals shown include unvalidated device data.  Last Pain:  Vitals:   06/07/20 0949  TempSrc: Oral  PainSc: 0-No pain         Complications: No complications documented.

## 2020-06-08 ENCOUNTER — Other Ambulatory Visit: Payer: Self-pay

## 2020-06-09 ENCOUNTER — Encounter: Payer: Self-pay | Admitting: Gastroenterology

## 2020-06-09 ENCOUNTER — Encounter (HOSPITAL_COMMUNITY): Payer: Self-pay | Admitting: Gastroenterology

## 2020-06-09 LAB — SURGICAL PATHOLOGY

## 2020-06-18 ENCOUNTER — Other Ambulatory Visit: Payer: Self-pay | Admitting: Family Medicine

## 2020-06-18 DIAGNOSIS — M5126 Other intervertebral disc displacement, lumbar region: Secondary | ICD-10-CM

## 2020-06-18 DIAGNOSIS — K219 Gastro-esophageal reflux disease without esophagitis: Secondary | ICD-10-CM

## 2020-07-11 ENCOUNTER — Other Ambulatory Visit: Payer: Self-pay

## 2020-07-11 ENCOUNTER — Ambulatory Visit (INDEPENDENT_AMBULATORY_CARE_PROVIDER_SITE_OTHER): Payer: 59 | Admitting: Family Medicine

## 2020-07-11 ENCOUNTER — Encounter: Payer: Self-pay | Admitting: Family Medicine

## 2020-07-11 DIAGNOSIS — F902 Attention-deficit hyperactivity disorder, combined type: Secondary | ICD-10-CM | POA: Diagnosis not present

## 2020-07-11 DIAGNOSIS — F3177 Bipolar disorder, in partial remission, most recent episode mixed: Secondary | ICD-10-CM

## 2020-07-11 MED ORDER — AMPHETAMINE-DEXTROAMPHET ER 20 MG PO CP24: 20.0000 mg | ORAL_CAPSULE | Freq: Every morning | ORAL | 0 refills | Status: DC

## 2020-07-11 MED ORDER — AMPHETAMINE-DEXTROAMPHET ER 20 MG PO CP24: 20.0000 mg | ORAL_CAPSULE | Freq: Every day | ORAL | 0 refills | Status: DC

## 2020-07-11 MED ORDER — LAMOTRIGINE 25 MG PO TABS: 50.0000 mg | ORAL_TABLET | Freq: Two times a day (BID) | ORAL | 2 refills | Status: DC

## 2020-07-11 NOTE — Progress Notes (Signed)
Subjective: CC: f/u ADHD PCP: [name], DO LGX:QJJHERDE [name] is a 34 y.o. male presenting to clinic today for:  1.  ADHD, combined type/bipolar disorder  Patient with longstanding history of ADHD, combined type.    Patient reports good control of ADHD with Adderall XR 20 mg daily.  He denies any heart palpitations, tremor, insomnia, increased anxiety.  He continues to have constipation alternating with diarrhea and uncontrolled acid reflux.  He will be seeing his gastroenterologist on Friday about this.  There apparently has been conversation about surgical resection of part of the stomach and subsequently may be proceeding with bariatric surgery.  He would be interested in this as he still has quite a bit of difficulty losing weight despite diet modification, physical activity.  Lamictal 50 mg twice daily is controlling symptoms well.  He denies any rash, intolerance to the medication.  Mood has been good.  ROS: Per HPI  Allergies  Allergen Reactions   Ibuprofen Other (See Comments)    Makes ulcers bleed   Influenza Vaccines Shortness Of Breath and Other (See Comments)    Rash and unable to breathe well   Tylenol [Acetaminophen] Other (See Comments)    Bleeding ulcers   Bee Venom Swelling    SWELLING REACTION UNSPECIFIED    Tramadol Itching   Past Medical History:  Diagnosis Date   ADHD (attention deficit hyperactivity disorder)    Anal fissure    Anxiety    Bipolar disorder (HCC)    Chronic lower back pain    Depression    Esophagitis    Distal esophageal erosions consistent with mild erosive  reflux esophagitis 12/2008 by EGD    External hemorrhoids    Gastric polyps    Gastric ulcer    GERD (gastroesophageal reflux disease)    Hepatic steatosis    Hiatal hernia    History of stomach ulcers    Hx MRSA infection 8/08   right thigh   Hx of colonic polyps    Hypercholesteremia    denies   Hypothyroidism    Internal  hemorrhoids    Irritable bowel syndrome (IBS)    Obesity    Occult GI bleeding 12/2008   Trivial upper GI bleed/uncontrolled GERD by upper endoscopy 12/2008, normal f/u endoscopy 03/2009 with SBCE at that time   OSA on CPAP    Pain management    Pneumonia 09/01/2015   "on ATB still" (09/04/2015)   S/P colonoscopy 11/10, 10/08   Dr [name]   Schizophrenia [facility])    Sleep apnea    Thyroid function test abnormal    Noted in 2011 discharge   Tobacco dipper    Wears contact lenses     Current Outpatient Medications:    [START ON 09/17/2020] amphetamine-dextroamphetamine (ADDERALL XR) 20 MG 24 hr capsule, Take 1 capsule (20 mg total) by mouth in the morning., Disp: 30 capsule, Rfl: 0   cyclobenzaprine (FLEXERIL) 10 MG tablet, Take 1 tablet (10 mg total) by mouth 3 (three) times daily as needed for muscle spasms (use sparingly!). for muscle spams, Disp: 90 tablet, Rfl: 2   dicyclomine (BENTYL) 20 MG tablet, Take 1 tablet (20 mg total) by mouth 4 (four) times daily -  before meals and at bedtime., Disp: 120 tablet, Rfl: 5   docusate sodium (COLACE) 250 MG capsule, Take 250 mg by mouth 2 (two) times daily., Disp: , Rfl:    EPINEPHrine (EPIPEN 2-PAK) 0.3 mg/0.3 mL IJ SOAJ injection, Inject 0.3 mg into  the muscle as needed for anaphylaxis., Disp: , Rfl:    fexofenadine (ALLEGRA) 180 MG tablet, Take 180 mg by mouth as needed for allergies or rhinitis., Disp: , Rfl:    fluticasone (FLONASE) 50 MCG/ACT nasal spray, Place 1 spray into both nostrils daily. (Patient taking differently: Place 2 sprays into both nostrils daily as needed for allergies. ), Disp: 16 g, Rfl: 2   gabapentin (NEURONTIN) 300 MG capsule, TAKE 1 CAPSULE BY MOUTH THREE TIMES DAILY, Disp: 90 capsule, Rfl: 0   lamoTRIgine (LAMICTAL) 25 MG tablet, Take 2 tablets (50 mg total) by mouth 2 (two) times daily., Disp: 120 tablet, Rfl: 2   levothyroxine (SYNTHROID) 75 MCG tablet, Take 1 tablet (75 mcg total) by mouth  daily before breakfast., Disp: 90 tablet, Rfl: 3   pantoprazole (PROTONIX) 40 MG tablet, Take 1 tablet by mouth twice daily, Disp: 180 tablet, Rfl: 0   [START ON 08/18/2020] amphetamine-dextroamphetamine (ADDERALL XR) 20 MG 24 hr capsule, Take 1 capsule (20 mg total) by mouth daily., Disp: 30 capsule, Rfl: 0   [START ON 07/19/2020] amphetamine-dextroamphetamine (ADDERALL XR) 20 MG 24 hr capsule, Take 1 capsule (20 mg total) by mouth daily., Disp: 30 capsule, Rfl: 0 Social History   Socioeconomic History   Marital status: Married    Spouse name: Not on file   Number of children: 2   Years of education: Not on file   Highest education level: Not on file  Occupational History   Occupation: VOLUNTEER FIREMAN    Employer: [employer]   Occupation: truck Geophysicist/field seismologist  Tobacco Use   Smoking status: Never Smoker   Smokeless tobacco: Current User    Types: Snuff  Vaping Use   Vaping Use: Never used  Substance and Sexual Activity   Alcohol use: Not Currently    Alcohol/week: 0.0 standard drinks    Comment: rare   Drug use: No   Sexual activity: Not Currently  Other Topics Concern   Not on file  Social History Narrative   Not on file   Social Determinants of Health   Financial Resource Strain:    Difficulty of Paying Living Expenses:   Food Insecurity:    Worried About Charity fundraiser in the Last Year:    Arboriculturist in the Last Year:   Transportation Needs:    Film/video editor (Medical):    Lack of Transportation (Non-Medical):   Physical Activity:    Days of Exercise per Week:    Minutes of Exercise per Session:   Stress:    Feeling of Stress :   Social Connections:    Frequency of Communication with Friends and Family:    Frequency of Social Gatherings with Friends and Family:    Attends Religious Services:    Active Member of Clubs or Organizations:    Attends Music therapist:    Marital Status:   Intimate  Partner Violence:    Fear of Current or Ex-Partner:    Emotionally Abused:    Physically Abused:    Sexually Abused:    Family History  Problem Relation Age of Onset   Leukemia Father 59   Colon polyps Father    Clotting disorder Father    Seizures Mother    Irritable bowel syndrome Mother    Bipolar disorder Brother    ADD / ADHD Brother    Diabetes Paternal Grandmother    Ulcerative colitis Paternal Aunt    Colon cancer Neg Hx  Liver cancer Neg Hx    Esophageal cancer Neg Hx     Objective: Office vital signs reviewed. BP 118/75    Pulse 81    Temp 97.7 F (36.5 C)    Ht 5\' 11"  (1.803 m)    Wt (!) 404 lb 12.8 oz (183.6 kg)    SpO2 99%    BMI 56.46 kg/m   Physical Examination:  General: Awake, alert, super morbidly obese, No acute distress HEENT: Normal, sclera white MMM Cardio: regular rate and rhythm, S1S2 heard, no murmurs appreciated Pulm: clear to auscultation bilaterally, no wheezes, rhonchi or rales; normal work of breathing on room air Psych: Mood stable, speech normal, affect appropriate, pleasant and interactive  Depression screen [facility] 2/9 07/11/2020 03/01/2020 12/01/2019  Decreased Interest 0 0 0  Down, Depressed, Hopeless 0 0 0  PHQ - 2 Score 0 0 0  Altered sleeping - 0 0  Tired, decreased energy - 0 0  Change in appetite - 0 0  Feeling bad or failure about yourself  - 0 0  Trouble concentrating - 0 0  Moving slowly or fidgety/restless - 0 0  Suicidal thoughts - 0 0  PHQ-9 Score - 0 0  Difficult doing work/chores - - -  Some recent data might be hidden    Assessment/ Plan: 34 y.o. male   1. Attention deficit hyperactivity disorder (ADHD), combined type Stable. The Narcotic Database has been reviewed.  There were no red flags.   - amphetamine-dextroamphetamine (ADDERALL XR) 20 MG 24 hr capsule; Take 1 capsule (20 mg total) by mouth in the morning.  Dispense: 30 capsule; Refill: 0 - amphetamine-dextroamphetamine (ADDERALL XR) 20 MG 24 hr  capsule; Take 1 capsule (20 mg total) by mouth daily.  Dispense: 30 capsule; Refill: 0 - amphetamine-dextroamphetamine (ADDERALL XR) 20 MG 24 hr capsule; Take 1 capsule (20 mg total) by mouth daily.  Dispense: 30 capsule; Refill: 0  2. Bipolar disorder, in partial remission, most recent episode mixed (HCC) Stable - lamoTRIgine (LAMICTAL) 25 MG tablet; Take 2 tablets (50 mg total) by mouth 2 (two) times daily.  Dispense: 120 tablet; Refill: 2  3. Morbid obesity ([place]) 3# weight loss since last visit.  Agree with bariatric surgical consult.  I also gave him Dennard Nip, MD's information    No orders of the defined types were placed in this encounter.  No orders of the defined types were placed in this encounter.    [name], DO Benjamin Perez [phone]

## 2020-07-13 ENCOUNTER — Encounter: Payer: Self-pay | Admitting: Gastroenterology

## 2020-07-13 ENCOUNTER — Ambulatory Visit (INDEPENDENT_AMBULATORY_CARE_PROVIDER_SITE_OTHER): Payer: 59 | Admitting: Gastroenterology

## 2020-07-13 VITALS — BP 116/70 | HR 90 | Ht 71.0 in | Wt >= 6400 oz

## 2020-07-13 DIAGNOSIS — K760 Fatty (change of) liver, not elsewhere classified: Secondary | ICD-10-CM | POA: Diagnosis not present

## 2020-07-13 DIAGNOSIS — K580 Irritable bowel syndrome with diarrhea: Secondary | ICD-10-CM | POA: Diagnosis not present

## 2020-07-13 DIAGNOSIS — K219 Gastro-esophageal reflux disease without esophagitis: Secondary | ICD-10-CM

## 2020-07-13 DIAGNOSIS — Z6841 Body Mass Index (BMI) 40.0 and over, adult: Secondary | ICD-10-CM

## 2020-07-13 DIAGNOSIS — R142 Eructation: Secondary | ICD-10-CM

## 2020-07-13 DIAGNOSIS — K649 Unspecified hemorrhoids: Secondary | ICD-10-CM

## 2020-07-13 NOTE — Progress Notes (Signed)
P  Chief Complaint: GERD  GI History: 34 year old Caucasian male with past medical history as listed below including morbid obesity, IBS, GERD, anxiety, bipolar, depression, ADHD, OSA on CPAP, follows in the GI clinic for the following:  1) IBS-D: Lower abdominal pain and diarrhea.  EGD/colonoscopy with random rectal biopsies otherwise unremarkable. Improved with increase dietary fiber/fiber bars, but still with intermittent cramping which tends to respond to Bentyl prn.  Previously discussed the role TCA.  Has been on multiple SSRIs and venlafaxine in the past; currently prescribed Lamictal and Adderall for bipolar and ADHD.  2) Hemorrhoids: Small grade 2 internal hemorrhoids noted on colonoscopy 10/2019.  Intermittent bleeding.  3) GERD with mild esophagitis: Longstanding history of reflux, index symptoms of heartburn, regurgitation.  HB and regurgitation generally well controlled with Protonix 40 mg BID, but still with breakthrough atypical symptoms (sour brash, belching).  No change with Pepcid 40 mg BID-stopped 04/2020.  No appreciable change with trial of Dexilant, has resumed Protonix.  4) Fatty liver disease: -RUQ [place] (08/2019): Fatty liver infiltration c/w hepatic steatosis -CT (03/2020): Mild hepatomegaly 22 cm without focal lesion -Normal liver enzymes  Endoscopic history: -EGD (08/2007, Dr. Leonides Schanz): Small hiatal hernia -Colonoscopy (08/2007, Dr. Gala Romney): Normal -EGD (12/2008, Dr. Gala Romney): Distal esophageal erosions consistent with mild erosive reflux esophagitis, small hiatal hernia -EGD (03/2009, Dr. Gala Romney): Small hiatal hernia, otherwise normal -VCE (03/2009, Dr.Rourk): Normal -Colonoscopy (09/2009): Hypertrophied anal papilla, otherwise normal -Colonoscopy (11/02/2019, Dr. Bryan Lemma): Hemorrhoids, 2 mm sessile serrated polyp in ascending colon.  Repeat in 5 years -EGD (11/02/2019, Dr. Bryan Lemma): Irregular Z-line (path: Reflux changes, no Barrett's), benign gastric polyps,  mild gastritis -EGD with Bravo placement (06/07/2020, Dr. Bryan Lemma): Mildly irregular Z-line, mild gastritis.  Successful deployment of Bravo  -Bravo study (05/2020): Good acid suppression PPI (Protonix), with DeMeester score 2.7.  No symptom correlation between symptoms and reflux events.  Overall number of reflux episodes was normal.  -CT abdomen/pelvis (03/14/2020): Mild hepatomegaly, mass normal GI  Aside from father with colon polyps, no known family history of CRC, GI malignancy, liver disease, pancreatic disease, or IBD   HPI:     Patient is a 34 y.o. male presenting to the Gastroenterology Clinic for follow-up.  Was last seen by me on 05/19/2020.  At that time, her main issue was intermittent rectal bleeding and ongoing reflux symptoms.  Reflux had improved with Protonix 40 mg BID, but ongoing "sour throat/smell".  Trial course of Dexilant, with no appreciable change.   Did discuss potential referral to bariatric surgery for treatment of morbid obesity and reflux.  Evaluated with EGD with Bravo which demonstrated good acid suppression on PPI therapy, normal number of reflux episodes.  Today, he states no difference in reflux sxs with trial of Dexilant as above. Trying to avoid spicy, greasy foods, soda.  Lower GI symptoms have been mainly quiesced sent.  Was seen by his PCM earlier this week, with recommendation to proceed with scheduling [facility]   Review of systems:     No chest pain, no SOB, no fevers, no urinary sx   Past Medical History:  Diagnosis Date  . ADHD (attention deficit hyperactivity disorder)   . Anal fissure   . Anxiety   . Bipolar disorder ([place])   . Chronic lower back pain   . Depression   . Esophagitis    Distal esophageal erosions consistent with mild erosive  reflux esophagitis 12/2008 by EGD   . External  hemorrhoids   . Gastric polyps   . Gastric ulcer   . GERD (gastroesophageal reflux disease)    . Hepatic steatosis   . Hiatal hernia   . History of stomach ulcers   . Hx MRSA infection 8/08   right thigh  . Hx of colonic polyps   . Hypercholesteremia    denies  . Hypothyroidism   . Internal hemorrhoids   . Irritable bowel syndrome (IBS)   . Obesity   . Occult GI bleeding 12/2008   Trivial upper GI bleed/uncontrolled GERD by upper endoscopy 12/2008, normal f/u endoscopy 03/2009 with SBCE at that time  . OSA on CPAP   . Pain management   . Pneumonia 09/01/2015   "on ATB still" (09/04/2015)  . S/P colonoscopy 11/10, 10/08   Dr [name]  . Schizophrenia [facility])   . Sleep apnea   . Thyroid function test abnormal    Noted in 2011 discharge  . Tobacco dipper   . Wears contact lenses     Patient's surgical history, family medical history, social history, medications and allergies were all reviewed in Epic    Current Outpatient Medications  Medication Sig Dispense Refill  . [START ON 07/19/2020] amphetamine-dextroamphetamine (ADDERALL XR) 20 MG 24 hr capsule Take 1 capsule (20 mg total) by mouth daily. 30 capsule 0  . cyclobenzaprine (FLEXERIL) 10 MG tablet Take 1 tablet (10 mg total) by mouth 3 (three) times daily as needed for muscle spasms (use sparingly!). for muscle spams 90 tablet 2  . dicyclomine (BENTYL) 20 MG tablet Take 1 tablet (20 mg total) by mouth 4 (four) times daily -  before meals and at bedtime. 120 tablet 5  . docusate sodium (COLACE) 250 MG capsule Take 250 mg by mouth 2 (two) times daily.    [name] Kitchen EPINEPHrine (EPIPEN 2-PAK) 0.3 mg/0.3 mL IJ SOAJ injection Inject 0.3 mg into the muscle as needed for anaphylaxis.    . fexofenadine (ALLEGRA) 180 MG tablet Take 180 mg by mouth as needed for allergies or rhinitis.    . fluticasone (FLONASE) 50 MCG/ACT nasal spray Place 1 spray into both nostrils daily. (Patient taking differently: Place 2 sprays into both nostrils daily as needed for allergies. ) 16 g 2  . gabapentin (NEURONTIN) 300 MG capsule TAKE 1 CAPSULE BY MOUTH  THREE TIMES DAILY 90 capsule 0  . lamoTRIgine (LAMICTAL) 25 MG tablet Take 2 tablets (50 mg total) by mouth 2 (two) times daily. 120 tablet 2  . levothyroxine (SYNTHROID) 75 MCG tablet Take 1 tablet (75 mcg total) by mouth daily before breakfast. 90 tablet 3  . pantoprazole (PROTONIX) 40 MG tablet Take 1 tablet by mouth twice daily 180 tablet 0   No current facility-administered medications for this visit.    Physical Exam:     BP 116/70   Pulse 90   Ht 5\' 11"  (1.803 m)   Wt (!) 405 lb 4 oz (183.8 kg)   BMI 56.52 kg/m   GENERAL:  Pleasant male in NAD PSYCH: : Cooperative, normal affect NEURO: Alert and oriented x 3, no focal neurologic deficits   IMPRESSION and PLAN:    1) GERD 2) Waterbrash 3) Belching  Reflux generally well controlled with high-dose PPI, but with ongoing bothersome breakthrough of atypical symptoms.  Bravo study demonstrates good control of acid on PPI therapy, and no increased number of reflux events.  Discussed these results and ongoing management at length today.  Discussed that his morbid obesity is certainly playing a role  with ongoing reflux symptoms.  I do query overlapping esophageal hypersensitivity/functional syndrome.  -Continue Protonix -Discussed antireflux dietary modifications -He is going to schedule an appointment with Dr. Leafy Ro at [facility]  4) IBS-D: -Continue Bentyl -Unsure if he actually trialed glutamine, but symptoms largely quiescsent at this time -Continue high-fiber diet -Imodium as needed  5) Internal hemorrhoids: -Conservative management for now - If symptomatic recurrence can consider hemorrhoid banding   6) Morbid obesity (BMI 60) -As above, patient to [facility] and can also discuss potential referral to Bariatric Surgery  7) NAFLD: -Normal liver enzymes, fatty infiltration on imaging -Previously discussed diagnosis.  Conservative  management for now  RTC in 6 months or sooner as needed     I spent 30 minutes of time, including in depth chart review, independent review of results as outlined above, communicating results with the patient directly, face-to-face time with the patient, coordinating care, and ordering studies and medications as appropriate, and documentation.      Lavena Bullion ,DO, FACG 07/13/2020, 2:25 PM

## 2020-07-13 NOTE — Patient Instructions (Addendum)
If you are age 34 or older, your body mass index should be between 23-30. Your Body mass index is 56.52 kg/m. If this is out of the aforementioned range listed, please consider follow up with your Primary Care Provider.  If you are age 39 or younger, your body mass index should be between 19-25. Your Body mass index is 56.52 kg/m. If this is out of the aformentioned range listed, please consider follow up with your Primary Care Provider.   Follow up in six months.  Please call the office for an appointment as the schedule is not available at this time.  It was a pleasure to see you today!  Vito Cirigliano, D.O.

## 2020-07-19 ENCOUNTER — Other Ambulatory Visit: Payer: Self-pay | Admitting: Family Medicine

## 2020-07-19 DIAGNOSIS — M5126 Other intervertebral disc displacement, lumbar region: Secondary | ICD-10-CM

## 2020-07-27 ENCOUNTER — Emergency Department (HOSPITAL_BASED_OUTPATIENT_CLINIC_OR_DEPARTMENT_OTHER)
Admission: EM | Admit: 2020-07-27 | Discharge: 2020-07-27 | Disposition: A | Discharge status: Home / Self Care | Payer: 59 | Attending: Emergency Medicine | Admitting: Emergency Medicine

## 2020-07-27 ENCOUNTER — Encounter: Payer: 59 | Admitting: Nurse Practitioner

## 2020-07-27 ENCOUNTER — Encounter (HOSPITAL_BASED_OUTPATIENT_CLINIC_OR_DEPARTMENT_OTHER): Payer: Self-pay | Admitting: Emergency Medicine

## 2020-07-27 ENCOUNTER — Other Ambulatory Visit: Payer: Self-pay

## 2020-07-27 DIAGNOSIS — E039 Hypothyroidism, unspecified: Secondary | ICD-10-CM | POA: Diagnosis not present

## 2020-07-27 DIAGNOSIS — Z79899 Other long term (current) drug therapy: Secondary | ICD-10-CM | POA: Insufficient documentation

## 2020-07-27 DIAGNOSIS — L259 Unspecified contact dermatitis, unspecified cause: Secondary | ICD-10-CM | POA: Diagnosis not present

## 2020-07-27 DIAGNOSIS — Z79890 Hormone replacement therapy (postmenopausal): Secondary | ICD-10-CM | POA: Diagnosis not present

## 2020-07-27 DIAGNOSIS — R21 Rash and other nonspecific skin eruption: Secondary | ICD-10-CM | POA: Diagnosis present

## 2020-07-27 MED ORDER — PREDNISONE 20 MG PO TABS: ORAL_TABLET | ORAL | 0 refills | Status: DC

## 2020-07-27 MED FILL — predniSONE 20 MG TABS: 20 | 12 days supply | Qty: 27 | Fill #0

## 2020-07-27 NOTE — Progress Notes (Signed)
Erroneous - patient called to cancel appointment because he just went o urgent care

## 2020-07-27 NOTE — ED Triage Notes (Signed)
Skin rash, itching, abdomen area, bilateral arms and face. Hx of it. Exposure to [place] per pt.

## 2020-07-27 NOTE — Discharge Instructions (Signed)
If you develop fever, vomiting, trouble breathing, new or worsening rash, or any other new/concerning symptoms then return to the ER.

## 2020-07-27 NOTE — ED Provider Notes (Signed)
[facility] EMERGENCY DEPARTMENT Provider Note   CSN: [id number] Arrival date & time: 07/27/20  0841     History Chief Complaint  Patient presents with  . Rash    [name] is a 34 y.o. male.  HPI 34 year old male presents with rash.  Ongoing for about 2 days.  He drives a large truck and thinks he came in contact with the vines it might have been poison oak or poison sumac.  Has itching and redness to both arms as well as his abdomen.  No trouble breathing or mouth lesions.  Has tried Benadryl and calamine lotion with no relief.   Past Medical History:  Diagnosis Date  . ADHD (attention deficit hyperactivity disorder)   . Anal fissure   . Anxiety   . Bipolar disorder ([place])   . Chronic lower back pain   . Depression   . Esophagitis    Distal esophageal erosions consistent with mild erosive  reflux esophagitis 12/2008 by EGD   . External hemorrhoids   . Gastric polyps   . Gastric ulcer   . GERD (gastroesophageal reflux disease)   . Hepatic steatosis   . Hiatal hernia   . History of stomach ulcers   . Hx MRSA infection 8/08   right thigh  . Hx of colonic polyps   . Hypercholesteremia    denies  . Hypothyroidism   . Internal hemorrhoids   . Irritable bowel syndrome (IBS)   . Obesity   . Occult GI bleeding 12/2008   Trivial upper GI bleed/uncontrolled GERD by upper endoscopy 12/2008, normal f/u endoscopy 03/2009 with SBCE at that time  . OSA on CPAP   . Pain management   . Pneumonia 09/01/2015   "on ATB still" (09/04/2015)  . S/P colonoscopy 11/10, 10/08   Dr [name]  . Schizophrenia [facility])   . Sleep apnea   . Thyroid function test abnormal    Noted in 2011 discharge  . Tobacco dipper   . Wears contact lenses     Patient Active Problem List   Diagnosis Date Noted  . Gastritis and gastroduodenitis   . Gastroesophageal reflux disease with esophagitis without hemorrhage 12/01/2019  . Belching   . Gastric polyps   . Hematochezia   .  Irritable bowel syndrome with diarrhea   . Grade II hemorrhoids   . Polyp of ascending colon   . 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 Seasonal and perennial allergic rhinitis 07/23/2014  . Abdominal mass of other site 06/15/2012  . Abdominal cramps 06/15/2012  . Obstructive sleep apnea 05/03/2012  . Syncope and collapse 05/03/2012  . Severe obesity (BMI >= 40) ([place]) 05/03/2012  . Anal fissure 05/27/2011  . UNSPECIFIED ANEMIA 09/11/2009  . Blood in stool 04/06/2009  . Lower abdominal pain 03/07/2009  . BIPOLAR DISORDER UNSPECIFIED 03/06/2009  . SMOKELESS TOBACCO ABUSE 03/06/2009  . Attention deficit hyperactivity disorder (ADHD) 03/06/2009  . GERD 03/06/2009  . Diarrhea 03/06/2009    Past Surgical History:  Procedure Laterality Date  . ANKLE FRACTURE SURGERY Left ~ 2008  . BACK SURGERY    . BIOPSY  11/02/2019   Procedure: BIOPSY;  Surgeon: Lavena Bullion, DO;  Location: [facility];  Service: Gastroenterology;;  EGD and Colon  . BIOPSY  06/07/2020   Procedure: BIOPSY;  Surgeon: Lavena Bullion, DO;  Location: [facility];  Service: Gastroenterology;;  . [place] STUDY N/A 06/07/2020   Procedure: BRAVO [place] on PPI;  Surgeon: Lavena Bullion, DO;  Location: [facility];  Service: Gastroenterology;  Laterality: N/A;  . COLONOSCOPY  10/10/2009   anal papilla otherwise normal  . COLONOSCOPY WITH PROPOFOL N/A 11/02/2019   Procedure: COLONOSCOPY WITH PROPOFOL;  Surgeon: Lavena Bullion, DO;  Location: [facility];  Service: Gastroenterology;  Laterality: N/A;  . ESOPHAGOGASTRODUODENOSCOPY   04/07/2009   Normal esophagus, small hiatal hernia  . ESOPHAGOGASTRODUODENOSCOPY  01/09/2009   Distal esophageal erosions consistent with mild erosive reflux esophagitis, otherwise normal esophagus, small hiatal herniaotherwise normal stomach, D1-D2   . ESOPHAGOGASTRODUODENOSCOPY  08/26/2007   Normal esophagus, a small hiatal/hernia, otherwise normal stomach D1 through D3  .  ESOPHAGOGASTRODUODENOSCOPY (EGD) WITH PROPOFOL N/A 11/02/2019   Procedure: ESOPHAGOGASTRODUODENOSCOPY (EGD) WITH PROPOFOL;  Surgeon: Lavena Bullion, DO;  Location: [facility];  Service: Gastroenterology;  Laterality: N/A;  . ESOPHAGOGASTRODUODENOSCOPY (EGD) WITH PROPOFOL N/A 06/07/2020   Procedure: ESOPHAGOGASTRODUODENOSCOPY (EGD) WITH PROPOFOL;  Surgeon: Lavena Bullion, DO;  Location: [facility];  Service: Gastroenterology;  Laterality: N/A;  . FRACTURE SURGERY    . ileocolonoscopy  08/26/2007    A normal rectum, colon, and terminal ileum  . INCISION AND DRAINAGE  09/04/2015   "reopened my back incsion"  . LUMBAR LAMINECTOMY/DECOMPRESSION MICRODISCECTOMY Left 09/23/2017   Procedure: Microdiscectomy - left - Lumbar four-Lumbar five;  Surgeon: Earnie Larsson, MD;  Location: [place];  Service: Neurosurgery;  Laterality: Left;  . LUMBAR MICRODISCECTOMY  08/21/2015  . LUMBAR MICRODISCECTOMY Left 09/23/2017   L4-5  . LUMBAR WOUND DEBRIDEMENT N/A 09/04/2015   Procedure: LUMBAR WOUND DEBRIDEMENT;  Surgeon: Consuella Lose, MD;  Location: [facility];  Service: Neurosurgery;  Laterality: N/A;  . right side sugery     enlarged lymph node gland removed under arm pit age 23-5 years  . Small bowel capsule  04/11/2009    normal throughout  . VASECTOMY         Family History  Problem Relation Age of Onset  . Leukemia Father 64  . Colon polyps Father   . Clotting disorder Father   . Seizures Mother   . Irritable bowel syndrome Mother   . Bipolar disorder Brother   . ADD / ADHD Brother   . Diabetes Paternal Grandmother   . Ulcerative colitis Paternal Aunt   . Colon cancer Neg Hx   . Liver cancer Neg Hx   . Esophageal cancer Neg Hx     Social History   Tobacco Use  . Smoking status: Never Smoker  . Smokeless tobacco: Current User    Types: Snuff  . Tobacco comment: quitting snuff. whinning off   Vaping Use  . Vaping Use: Never used  Substance Use Topics  . Alcohol use: Not  Currently    Alcohol/week: 0.0 standard drinks    Comment: rare  . Drug use: No    Home Medications Prior to Admission medications   Medication Sig Start Date End Date Taking? Authorizing Provider  amphetamine-dextroamphetamine (ADDERALL XR) 20 MG 24 hr capsule Take 1 capsule (20 mg total) by mouth daily. 07/19/20   [name], DO  cyclobenzaprine (FLEXERIL) 10 MG tablet Take 1 tablet (10 mg total) by mouth 3 (three) times daily as needed for muscle spasms (use sparingly!). for muscle spams 06/19/20   Ronnie Doss M, DO  dicyclomine (BENTYL) 20 MG tablet Take 1 tablet (20 mg total) by mouth 4 (four) times daily -  before meals and at bedtime. 03/31/20   Levin Erp, PA  docusate sodium (COLACE) 250 MG capsule Take 250 mg  by mouth 2 (two) times daily.    [provider]  EPINEPHrine (EPIPEN 2-PAK) 0.3 mg/0.3 mL IJ SOAJ injection Inject 0.3 mg into the muscle as needed for anaphylaxis.    [provider]  fexofenadine (ALLEGRA) 180 MG tablet Take 180 mg by mouth as needed for allergies or rhinitis.    [provider]  fluticasone (FLONASE) 50 MCG/ACT nasal spray Place 1 spray into both nostrils daily. Patient taking differently: Place 2 sprays into both nostrils daily as needed for allergies.  03/03/20   Martyn Ehrich, NP  gabapentin (NEURONTIN) 300 MG capsule TAKE 1 CAPSULE BY MOUTH THREE TIMES DAILY 07/20/20   Ronnie Doss M, DO  lamoTRIgine (LAMICTAL) 25 MG tablet Take 2 tablets (50 mg total) by mouth 2 (two) times daily. 07/11/20   [name], DO  levothyroxine (SYNTHROID) 75 MCG tablet Take 1 tablet (75 mcg total) by mouth daily before breakfast. 12/02/19   Ronnie Doss M, DO  pantoprazole (PROTONIX) 40 MG tablet Take 1 tablet by mouth twice daily 06/19/20   Ronnie Doss M, DO  predniSONE (DELTASONE) 20 MG tablet 3 tabs po daily x 3 days, then 2 tabs x 3 days, then 1.5 tabs x 3 days, then 1 tab x 3 days, then 0.5 tabs x 3  days 07/27/20   Sherwood Gambler, MD    Allergies    Ibuprofen, Influenza vaccines, Tylenol [acetaminophen], Bee venom, and Tramadol  Review of Systems   Review of Systems  Constitutional: Negative for fever.  Respiratory: Negative for shortness of breath.   Skin: Positive for rash.    Physical Exam Updated Vital Signs BP 124/74   Pulse 81   Temp 97.8 F (36.6 C) (Oral)   Resp 18   Ht 5\' 11"  (1.803 m)   Wt (!) 174.6 kg   SpO2 100%   BMI 53.70 kg/m   Physical Exam Vitals and nursing note reviewed.  Constitutional:      General: He is not in acute distress.    Appearance: He is well-developed. He is obese. He is not ill-appearing or diaphoretic.  HENT:     Head: Normocephalic and atraumatic.     Right Ear: External ear normal.     Left Ear: External ear normal.     Nose: Nose normal.     Mouth/Throat:     Comments: No intra-oral lesions Eyes:     General:        Right eye: No discharge.        Left eye: No discharge.  Cardiovascular:     Rate and Rhythm: Normal rate and regular rhythm.     Heart sounds: Normal heart sounds.  Pulmonary:     Effort: Pulmonary effort is normal.     Breath sounds: Normal breath sounds.  Abdominal:     Palpations: Abdomen is soft.     Tenderness: There is no abdominal tenderness.  Musculoskeletal:     Cervical back: Neck supple.  Skin:    General: Skin is warm and dry.     Findings: Rash present.     Comments: Nonspecific rash to abdomen, with small patches on arms.   Neurological:     Mental Status: He is alert.  Psychiatric:        Mood and Affect: Mood is not anxious.     ED Results / Procedures / Treatments   Labs (all labs ordered are listed, but only abnormal results are displayed) Labs Reviewed - No data to  display  EKG None  Radiology No results found.  Procedures Procedures (including critical care time)  Medications Ordered in ED Medications - No data to display  ED Course  I have reviewed the triage  vital signs and the nursing notes.  Pertinent labs & imaging results that were available during my care of the patient were reviewed by me and considered in my medical decision making (see chart for details).    MDM Rules/Calculators/A&P                          Patient with a nonspecific rash that given the history is probably a contact dermatitis.  He states he has had this before and typically responded to steroids.  I think this is reasonable given the multiple different areas.  Continue Benadryl and will give steroid taper. Final Clinical Impression(s) / ED Diagnoses Final diagnoses:  Contact dermatitis, unspecified contact dermatitis type, unspecified trigger    Rx / DC Orders ED Discharge Orders         Ordered    predniSONE (DELTASONE) 20 MG tablet        07/27/20 2633           Sherwood Gambler, MD 07/27/20 [phone]

## 2020-08-02 ENCOUNTER — Other Ambulatory Visit: Payer: Self-pay

## 2020-08-02 ENCOUNTER — Encounter (INDEPENDENT_AMBULATORY_CARE_PROVIDER_SITE_OTHER): Payer: Self-pay | Admitting: Bariatrics

## 2020-08-02 ENCOUNTER — Ambulatory Visit (INDEPENDENT_AMBULATORY_CARE_PROVIDER_SITE_OTHER): Payer: 59 | Admitting: Bariatrics

## 2020-08-02 VITALS — BP 118/80 | HR 77 | Temp 97.4°F | Ht 68.0 in | Wt 396.0 lb

## 2020-08-02 DIAGNOSIS — G4733 Obstructive sleep apnea (adult) (pediatric): Secondary | ICD-10-CM | POA: Diagnosis not present

## 2020-08-02 DIAGNOSIS — Z1331 Encounter for screening for depression: Secondary | ICD-10-CM | POA: Diagnosis not present

## 2020-08-02 DIAGNOSIS — K580 Irritable bowel syndrome with diarrhea: Secondary | ICD-10-CM

## 2020-08-02 DIAGNOSIS — Z6841 Body Mass Index (BMI) 40.0 and over, adult: Secondary | ICD-10-CM

## 2020-08-02 DIAGNOSIS — R0602 Shortness of breath: Secondary | ICD-10-CM | POA: Diagnosis not present

## 2020-08-02 DIAGNOSIS — R5383 Other fatigue: Secondary | ICD-10-CM

## 2020-08-02 DIAGNOSIS — K760 Fatty (change of) liver, not elsewhere classified: Secondary | ICD-10-CM

## 2020-08-02 DIAGNOSIS — E559 Vitamin D deficiency, unspecified: Secondary | ICD-10-CM | POA: Diagnosis not present

## 2020-08-02 DIAGNOSIS — Z0289 Encounter for other administrative examinations: Secondary | ICD-10-CM

## 2020-08-02 DIAGNOSIS — Z9989 Dependence on other enabling machines and devices: Secondary | ICD-10-CM

## 2020-08-02 DIAGNOSIS — Z9189 Other specified personal risk factors, not elsewhere classified: Secondary | ICD-10-CM

## 2020-08-02 DIAGNOSIS — K219 Gastro-esophageal reflux disease without esophagitis: Secondary | ICD-10-CM

## 2020-08-03 ENCOUNTER — Encounter (INDEPENDENT_AMBULATORY_CARE_PROVIDER_SITE_OTHER): Payer: Self-pay | Admitting: Bariatrics

## 2020-08-03 DIAGNOSIS — E8881 Metabolic syndrome: Secondary | ICD-10-CM | POA: Insufficient documentation

## 2020-08-03 LAB — TSH+T4F+T3FREE
Free T4: 1.37 ng/dL (ref 0.82–1.77)
T3, Free: 3.2 pg/mL (ref 2.0–4.4)
TSH: 2.3 u[IU]/mL (ref 0.450–4.500)

## 2020-08-03 LAB — COMPREHENSIVE METABOLIC PANEL
ALT: 19 IU/L (ref 0–44)
AST: 13 IU/L (ref 0–40)
Albumin/Globulin Ratio: 1.7 (ref 1.2–2.2)
Albumin: 4 g/dL (ref 4.0–5.0)
Alkaline Phosphatase: 60 IU/L (ref 48–121)
BUN/Creatinine Ratio: 16 (ref 9–20)
BUN: 16 mg/dL (ref 6–20)
Bilirubin Total: 0.4 mg/dL (ref 0.0–1.2)
CO2: 25 mmol/L (ref 20–29)
Calcium: 9.3 mg/dL (ref 8.7–10.2)
Chloride: 101 mmol/L (ref 96–106)
Creatinine, Ser: 0.99 mg/dL (ref 0.76–1.27)
GFR calc Af Amer: 114 mL/min/{1.73_m2} (ref >59)
GFR calc non Af Amer: 99 mL/min/{1.73_m2} (ref >59)
Globulin, Total: 2.3 g/dL (ref 1.5–4.5)
Glucose: 97 mg/dL (ref 65–99)
Potassium: 4.3 mmol/L (ref 3.5–5.2)
Sodium: 138 mmol/L (ref 134–144)
Total Protein: 6.3 g/dL (ref 6.0–8.5)

## 2020-08-03 LAB — INSULIN, RANDOM: INSULIN: 15.3 u[IU]/mL (ref 2.6–24.9)

## 2020-08-03 LAB — LIPID PANEL WITH LDL/HDL RATIO
Cholesterol, Total: 148 mg/dL (ref 100–199)
HDL: 48 mg/dL (ref >39)
LDL Chol Calc (NIH): 83 mg/dL (ref 0–99)
LDL/HDL Ratio: 1.7 ratio (ref 0.0–3.6)
Triglycerides: 88 mg/dL (ref 0–149)
VLDL Cholesterol Cal: 17 mg/dL (ref 5–40)

## 2020-08-03 LAB — HEMOGLOBIN A1C
Est. average glucose Bld gHb Est-mCnc: 111 mg/dL
Hgb A1c MFr Bld: 5.5 % (ref 4.8–5.6)

## 2020-08-03 LAB — VITAMIN D 25 HYDROXY (VIT D DEFICIENCY, FRACTURES): Vit D, 25-Hydroxy: 28.1 ng/mL — ABNORMAL LOW (ref 30.0–100.0)

## 2020-08-07 NOTE — Progress Notes (Signed)
Chief Complaint:   OBESITY [name] (MR# [id number]) is a 34 y.o. male who presents for evaluation and treatment of obesity and related comorbidities. Current BMI is Body mass index is 60.21 kg/m. [name] has been struggling with his weight for many years and has been unsuccessful in either losing weight, maintaining weight loss, or reaching his healthy weight goal.  [name] is currently in the action stage of change and ready to dedicate time achieving and maintaining a healthier weight. [name] is interested in becoming our patient and working on intensive lifestyle modifications including (but not limited to) diet and exercise for weight loss.  [name] says he likes to cook (grill).  He craves meats.  He says that he skips meals as well.  [name]'s habits were reviewed today and are as follows: His family eats meals together, he thinks his family will eat healthier with him, his desired weight loss is 110 pounds, he has been heavy most of his life, he started gaining weight around age 15, his heaviest weight ever was 425 pounds, he craves shrimp and crab legs, he skips lunch frequently and he is frequently drinking liquids with calories.  Depression Screen [name]'s Food and Mood (modified PHQ-9) score was 5.  Depression screen PHQ 2/9 08/02/2020  Decreased Interest 1  Down, Depressed, Hopeless 0  PHQ - 2 Score 1  Altered sleeping 2  Tired, decreased energy 1  Change in appetite 0  Feeling bad or failure about yourself  1  Trouble concentrating 0  Moving slowly or fidgety/restless 0  Suicidal thoughts 0  PHQ-9 Score 5  Difficult doing work/chores Not difficult at all  Some recent data might be hidden   Subjective:   1. Other fatigue Abhiraj denies daytime somnolence and denies waking up still tired. Patent has a history of symptoms of snoring. [name] generally gets 4-6 hours of sleep per night, and states that he has generally restful sleep. Snoring is  present. Apneic episodes are not present. Epworth Sleepiness Score is 7.  [name] uses a CPAP machine.  2. SOB (shortness of breath) on exertion [name] notes increasing shortness of breath with exercising and seems to be worsening over time with weight gain. He notes getting out of breath sooner with activity than he used to. This has not gotten worse recently. [name] denies shortness of breath at rest or orthopnea.  3. OSA on CPAP [name] has a diagnosis of sleep apnea. He reports that he is using a CPAP regularly.  He endorses fairly restful sleep.   4. Irritable bowel syndrome with diarrhea He is on specific medications for this.  5. Gastroesophageal reflux disease without esophagitis He takes Protonix and Bentyl.  He says he has history of "stomach ulcers".    6. Vitamin D deficiency He is currently taking no vitamin D supplement. He denies nausea, vomiting or muscle weakness.  7. Fatty liver US abdomen on 09/06/2020 showed hepatic steatosis.   8. Depression screening [name] was screened for depression as part of his new patient workup today.  9. At risk for activity intolerance [name] is at risk for exercise intolerance due to OSA, obesity, and back pain.  Assessment/Plan:   1. Other fatigue [name] does feel that his weight is causing his energy to be lower than it should be. Fatigue may be related to obesity, depression or many other causes. Labs will be ordered, and in the meanwhile, [name] will focus on self care including making healthy food choices, increasing physical activity  and focusing on stress reduction.  - EKG 12-Lead - Comprehensive metabolic panel - Hemoglobin A1c - Insulin, random - Lipid Panel With LDL/HDL Ratio - VITAMIN D 25 Hydroxy (Vit-D Deficiency, Fractures) - TSH+T4F+T3Free  2. SOB (shortness of breath) on exertion [name] does not feel that he gets out of breath more easily that he used to when he exercises. [name]'s shortness of  breath appears to be obesity related and exercise induced. He has agreed to work on weight loss and gradually increase exercise to treat his exercise induced shortness of breath. Will continue to monitor closely.  - Lipid Panel With LDL/HDL Ratio  3. OSA on CPAP Intensive lifestyle modifications are the first line treatment for this issue. We discussed several lifestyle modifications today and he will continue to work on diet, exercise and weight loss efforts. We will continue to monitor. Orders and follow up as documented in patient record.  He will continue to use his CPAP every night.  4. Irritable bowel syndrome with diarrhea GI will follow him for this.  5. Gastroesophageal reflux disease without esophagitis Intensive lifestyle modifications are the first line treatment for this issue. We discussed several lifestyle modifications today and he will continue to work on diet, exercise and weight loss efforts. Orders and follow up as documented in patient record.  Continue medications.  Discussed how weight loss can help.  Counseling . If a person has gastroesophageal reflux disease (GERD), food and stomach acid move back up into the esophagus and cause symptoms or problems such as damage to the esophagus. . Anti-reflux measures include: raising the head of the bed, avoiding tight clothing or belts, avoiding eating late at night, not lying down shortly after mealtime, and achieving weight loss. . Avoid ASA, NSAID's, caffeine, alcohol, and tobacco.  . OTC Pepcid and/or Tums are often very helpful for as needed use.  [name] Kitchen However, for persisting chronic or daily symptoms, stronger medications like Omeprazole may be needed. . You may need to avoid foods and drinks such as: ? Coffee and tea (with or without caffeine). ? Drinks that contain alcohol. ? Energy drinks and sports drinks. ? Bubbly (carbonated) drinks or sodas. ? Chocolate and cocoa. ? Peppermint and mint flavorings. ? Garlic and  onions. ? Horseradish. ? Spicy and acidic foods. These include peppers, chili powder, curry powder, vinegar, hot sauces, and BBQ sauce. ? Citrus fruit juices and citrus fruits, such as oranges, lemons, and limes. ? Tomato-based foods. These include red sauce, chili, salsa, and pizza with red sauce. ? Fried and fatty foods. These include donuts, french fries, potato chips, and high-fat dressings. ? High-fat meats. These include hot dogs, rib eye steak, sausage, ham, and bacon.  6. Vitamin D deficiency Low Vitamin D level contributes to fatigue and are associated with obesity, breast, and colon cancer.  Will check vitamin D level today.  - VITAMIN D 25 Hydroxy (Vit-D Deficiency, Fractures)  7. Fatty liver Gualberto's GI and PCP will follow.  8. Depression screening Tonny had a negative depression screening today.  9. At risk for activity intolerance Obediah was given approximately 15 minutes of exercise intolerance counseling today. He is 34 y.o. male and has risk factors exercise intolerance including obesity. We discussed intensive lifestyle modifications today with an emphasis on specific weight loss instructions and strategies. Haleem will slowly increase activity as tolerated.  Repetitive spaced learning was employed today to elicit superior memory formation and behavioral change.  10. Class 3 severe obesity with serious comorbidity and body mass  index (BMI) of 60.0 to 69.9 in adult, unspecified obesity type [facility]) Daejon is currently in the action stage of change and his goal is to continue with weight loss efforts. I recommend Royalty begin the structured treatment plan as follows:  He has agreed to the Category 4 Plan.  He will work on meal planning and intentional eating.  I reviewed labs independently from 03/22/2020 (CMP, CBC, glucose).  He will work on decreasing his portion size.  Exercise goals: No exercise has been prescribed at this time.   Behavioral modification  strategies: increasing lean protein intake, decreasing simple carbohydrates, increasing vegetables, increasing water intake, decreasing eating out, no skipping meals, meal planning and cooking strategies, keeping healthy foods in the home and planning for success.  He was informed of the importance of frequent follow-up visits to maximize his success with intensive lifestyle modifications for his multiple health conditions. He was informed we would discuss his lab results at his next visit unless there is a critical issue that needs to be addressed sooner. Yazeed agreed to keep his next visit at the agreed upon time to discuss these results.  Objective:   Blood pressure 118/80, pulse 77, temperature (!) 97.4 F (36.3 C), height 5\' 8"  (1.727 m), weight (!) 396 lb (179.6 kg), SpO2 100 %. Body mass index is 60.21 kg/m.  EKG: Normal sinus rhythm, rate 75 bpm.  Indirect Calorimeter completed today shows a VO2 of 472 and a REE of 3291.  His calculated basal metabolic rate is 9528 thus his basal metabolic rate is better than expected.  General: Cooperative, alert, well developed, in no acute distress. HEENT: Conjunctivae and lids unremarkable. Cardiovascular: Regular rhythm.  Lungs: Normal work of breathing. Neurologic: No focal deficits.   Lab Results  Component Value Date   CREATININE 0.99 08/02/2020   BUN 16 08/02/2020   NA 138 08/02/2020   K 4.3 08/02/2020   CL 101 08/02/2020   CO2 25 08/02/2020   Lab Results  Component Value Date   ALT 19 08/02/2020   AST 13 08/02/2020   ALKPHOS 60 08/02/2020   BILITOT 0.4 08/02/2020   Lab Results  Component Value Date   HGBA1C 5.5 08/02/2020   HGBA1C 5.1 07/07/2018   HGBA1C 5.2 08/19/2013   HGBA1C 5.6 05/03/2012   HGBA1C  03/01/2010    5.9 (NOTE) The ADA recommends the following therapeutic goal for glycemic control related to Hgb A1c measurement: Goal of therapy: <6.5 Hgb A1c  Reference: American Diabetes Association: Clinical Practice  Recommendations 2010, Diabetes Care, 2010, 33: (Suppl  1).   Lab Results  Component Value Date   INSULIN 15.3 08/02/2020   Lab Results  Component Value Date   TSH 2.300 08/02/2020   Lab Results  Component Value Date   CHOL 148 08/02/2020   HDL 48 08/02/2020   LDLCALC 83 08/02/2020   TRIG 88 08/02/2020   CHOLHDL 3.9 07/07/2018   Lab Results  Component Value Date   WBC 11.5 (H) 03/22/2020   HGB 15.1 03/22/2020   HCT 45.8 03/22/2020   MCV 85.8 03/22/2020   PLT 300 03/22/2020   Lab Results  Component Value Date   IRON 63 03/01/2020   TIBC 331 03/01/2020   FERRITIN 82 03/01/2020   Attestation Statements:   Reviewed by clinician on day of visit: allergies, medications, problem list, medical history, surgical history, family history, social history, and previous encounter notes.  I, Water quality scientist, CMA, am acting as Location manager for [employer], DO  I have  reviewed the above documentation for accuracy and completeness, and I agree with the above. Jearld Lesch, DO

## 2020-08-08 ENCOUNTER — Encounter (INDEPENDENT_AMBULATORY_CARE_PROVIDER_SITE_OTHER): Payer: Self-pay | Admitting: Bariatrics

## 2020-08-16 ENCOUNTER — Other Ambulatory Visit: Payer: Self-pay

## 2020-08-16 ENCOUNTER — Ambulatory Visit (INDEPENDENT_AMBULATORY_CARE_PROVIDER_SITE_OTHER): Payer: 59 | Admitting: Bariatrics

## 2020-08-16 ENCOUNTER — Encounter (INDEPENDENT_AMBULATORY_CARE_PROVIDER_SITE_OTHER): Payer: Self-pay | Admitting: Bariatrics

## 2020-08-16 VITALS — BP 108/71 | HR 63 | Temp 97.5°F | Ht 68.0 in | Wt 396.0 lb

## 2020-08-16 DIAGNOSIS — E559 Vitamin D deficiency, unspecified: Secondary | ICD-10-CM | POA: Diagnosis not present

## 2020-08-16 DIAGNOSIS — Z9189 Other specified personal risk factors, not elsewhere classified: Secondary | ICD-10-CM | POA: Diagnosis not present

## 2020-08-16 DIAGNOSIS — K219 Gastro-esophageal reflux disease without esophagitis: Secondary | ICD-10-CM | POA: Diagnosis not present

## 2020-08-16 DIAGNOSIS — E8881 Metabolic syndrome: Secondary | ICD-10-CM | POA: Diagnosis not present

## 2020-08-16 DIAGNOSIS — K588 Other irritable bowel syndrome: Secondary | ICD-10-CM | POA: Diagnosis not present

## 2020-08-16 DIAGNOSIS — Z6841 Body Mass Index (BMI) 40.0 and over, adult: Secondary | ICD-10-CM

## 2020-08-16 MED ORDER — VITAMIN D (ERGOCALCIFEROL) 1.25 MG (50000 UNIT) PO CAPS: 50000.0000 [IU] | ORAL_CAPSULE | ORAL | 0 refills | Status: DC

## 2020-08-17 NOTE — Progress Notes (Signed)
Chief Complaint:   OBESITY [name] is here to discuss his progress with his obesity treatment plan along with follow-up of his obesity related diagnoses. [name] is on the Category 4 Plan and states he is following his eating plan approximately 75% of the time. [name] states he is walking 15 miles.   Today's visit was #: 2 Starting weight: 396 lbs Starting date: 08/02/2020 Today's weight: 396 lbs Today's date: 08/16/2020 Total lbs lost to date: 0 Total lbs lost since last in-office visit: 0  Interim History: [name] has maintained his weight since the last visit. He needs surgery on esophageal stomach. He reports using the air fryer more.  Subjective:   Vitamin D deficiency. No nausea, vomiting, or muscle weakness.    Ref. Range 08/02/2020 10:18  Vitamin D, 25-Hydroxy Latest Ref Range: 30.0 - 100.0 ng/mL 28.1 (L)   Insulin resistance. [name] has a diagnosis of insulin resistance based on his elevated fasting insulin level >5. He continues to work on diet and exercise to decrease his risk of diabetes. [name] has a history of gastric ulcers; will not start metformin.  Lab Results  Component Value Date   INSULIN 15.3 08/02/2020   Lab Results  Component Value Date   HGBA1C 5.5 08/02/2020   Gastroesophageal reflux disease without esophagitis. [name] has a history of gastric ulcers and has a remaining irritated stomach. He is taking Bentyl and Protonix.   Other irritable bowel syndrome. [name] is taking stool softeners.  At risk for diabetes mellitus. [name] is at higher than average risk for developing diabetes due to insulin resistance.  Assessment/Plan:   Vitamin D deficiency. Low Vitamin D level contributes to fatigue and are associated with obesity, breast, and colon cancer. He was given a prescription for Vitamin D, Ergocalciferol, (DRISDOL) 1.25 MG (50000 UNIT) CAPS capsule every week #12 with 0 refills and will follow-up for routine testing of  Vitamin D, at least 2-3 times per year to avoid over-replacement.   Insulin resistance. Solace will continue to work on weight loss, exercise, increasing healthy fats and protein, and decreasing simple carbohydrates to help decrease the risk of diabetes. Lavan agreed to follow-up with [place] as directed to closely monitor his progress.  Gastroesophageal reflux disease without esophagitis. Intensive lifestyle modifications are the first line treatment for this issue. We discussed several lifestyle modifications today and he will continue to work on diet, exercise and weight loss efforts. Orders and follow up as documented in patient record. Tyrez will continue his medications as directed.   Counseling . If a person has gastroesophageal reflux disease (GERD), food and stomach acid move back up into the esophagus and cause symptoms or problems such as damage to the esophagus. . Anti-reflux measures include: raising the head of the bed, avoiding tight clothing or belts, avoiding eating late at night, not lying down shortly after mealtime, and achieving weight loss. . Avoid ASA, NSAID's, caffeine, alcohol, and tobacco.  . OTC Pepcid and/or Tums are often very helpful for as needed use.  [name] Kitchen However, for persisting chronic or daily symptoms, stronger medications like Omeprazole may be needed. . You may need to avoid foods and drinks such as: ? Coffee and tea (with or without caffeine). ? Drinks that contain alcohol. ? Energy drinks and sports drinks. ? Bubbly (carbonated) drinks or sodas. ? Chocolate and cocoa. ? Peppermint and mint flavorings. ? Garlic and onions. ? Horseradish. ? Spicy and acidic foods. These include peppers, chili powder, curry powder, vinegar, hot  sauces, and BBQ sauce. ? Citrus fruit juices and citrus fruits, such as oranges, lemons, and limes. ? Tomato-based foods. These include red sauce, chili, salsa, and pizza with red sauce. ? Fried and fatty foods. These include  donuts, french fries, potato chips, and high-fat dressings. ? High-fat meats. These include hot dogs, rib eye steak, sausage, ham, and bacon.  Other irritable bowel syndrome. Nery will continue stool softeners as directed and will try Metamucil.  At risk for diabetes mellitus. [name] was given approximately 15 minutes of diabetes education and counseling today. We discussed intensive lifestyle modifications today with an emphasis on weight loss as well as increasing exercise and decreasing simple carbohydrates in his diet. We also reviewed medication options with an emphasis on risk versus benefit of those discussed.   Repetitive spaced learning was employed today to elicit superior memory formation and behavioral change.  Class 3 severe obesity with serious comorbidity and body mass index (BMI) of 60.0 to 69.9 in adult, unspecified obesity type ([place]).  [name] is currently in the action stage of change. As such, his goal is to continue with weight loss efforts. He has agreed to the Category 4 Plan.   He will work on meal planning.  We independently reviewed with the patient labs from 08/02/2020 including CMP, lipids, Vitamin D, A1c, insulin, and thyroid panel.  Exercise goals: All adults should avoid inactivity. Some physical activity is better than none, and adults who participate in any amount of physical activity gain some health benefits.  Behavioral modification strategies: increasing lean protein intake, decreasing simple carbohydrates, increasing vegetables, increasing water intake, decreasing eating out, no skipping meals, meal planning and cooking strategies, keeping healthy foods in the home and planning for success.  [name] has agreed to follow-up with our clinic in 2 weeks. He was informed of the importance of frequent follow-up visits to maximize his success with intensive lifestyle modifications for his multiple health conditions.   Objective:   Blood pressure 108/71,  pulse 63, temperature (!) 97.5 F (36.4 C), temperature source Oral, height 5\' 8"  (1.727 m), weight (!) 396 lb (179.6 kg), SpO2 99 %. Body mass index is 60.21 kg/m.  General: Cooperative, alert, well developed, in no acute distress. HEENT: Conjunctivae and lids unremarkable. Cardiovascular: Regular rhythm.  Lungs: Normal work of breathing. Neurologic: No focal deficits.   Lab Results  Component Value Date   CREATININE 0.99 08/02/2020   BUN 16 08/02/2020   NA 138 08/02/2020   K 4.3 08/02/2020   CL 101 08/02/2020   CO2 25 08/02/2020   Lab Results  Component Value Date   ALT 19 08/02/2020   AST 13 08/02/2020   ALKPHOS 60 08/02/2020   BILITOT 0.4 08/02/2020   Lab Results  Component Value Date   HGBA1C 5.5 08/02/2020   HGBA1C 5.1 07/07/2018   HGBA1C 5.2 08/19/2013   HGBA1C 5.6 05/03/2012   HGBA1C  03/01/2010    5.9 (NOTE) The ADA recommends the following therapeutic goal for glycemic control related to Hgb A1c measurement: Goal of therapy: <6.5 Hgb A1c  Reference: American Diabetes Association: Clinical Practice Recommendations 2010, Diabetes Care, 2010, 33: (Suppl  1).   Lab Results  Component Value Date   INSULIN 15.3 08/02/2020   Lab Results  Component Value Date   TSH 2.300 08/02/2020   Lab Results  Component Value Date   CHOL 148 08/02/2020   HDL 48 08/02/2020   LDLCALC 83 08/02/2020   TRIG 88 08/02/2020   CHOLHDL 3.9 07/07/2018  Lab Results  Component Value Date   WBC 11.5 (H) 03/22/2020   HGB 15.1 03/22/2020   HCT 45.8 03/22/2020   MCV 85.8 03/22/2020   PLT 300 03/22/2020   Lab Results  Component Value Date   IRON 63 03/01/2020   TIBC 331 03/01/2020   FERRITIN 82 03/01/2020   Attestation Statements:   Reviewed by clinician on day of visit: allergies, medications, problem list, medical history, surgical history, family history, social history, and previous encounter notes.  Migdalia Dk, am acting as Location manager for [employer], DO    I have reviewed the above documentation for accuracy and completeness, and I agree with the above. Jearld Lesch, DO

## 2020-08-19 ENCOUNTER — Other Ambulatory Visit: Payer: Self-pay | Admitting: Family Medicine

## 2020-08-19 DIAGNOSIS — M5126 Other intervertebral disc displacement, lumbar region: Secondary | ICD-10-CM

## 2020-08-30 ENCOUNTER — Encounter (INDEPENDENT_AMBULATORY_CARE_PROVIDER_SITE_OTHER): Payer: Self-pay | Admitting: Family Medicine

## 2020-08-30 ENCOUNTER — Other Ambulatory Visit: Payer: Self-pay

## 2020-08-30 ENCOUNTER — Ambulatory Visit (INDEPENDENT_AMBULATORY_CARE_PROVIDER_SITE_OTHER): Payer: 59 | Admitting: Family Medicine

## 2020-08-30 VITALS — BP 122/74 | HR 82 | Temp 97.9°F | Ht 68.0 in | Wt 389.0 lb

## 2020-08-30 DIAGNOSIS — Z6841 Body Mass Index (BMI) 40.0 and over, adult: Secondary | ICD-10-CM | POA: Diagnosis not present

## 2020-08-30 DIAGNOSIS — K219 Gastro-esophageal reflux disease without esophagitis: Secondary | ICD-10-CM | POA: Diagnosis not present

## 2020-08-30 DIAGNOSIS — E8881 Metabolic syndrome: Secondary | ICD-10-CM | POA: Diagnosis not present

## 2020-08-30 DIAGNOSIS — E88819 Insulin resistance, unspecified: Secondary | ICD-10-CM

## 2020-08-30 NOTE — Progress Notes (Addendum)
Chief Complaint:   OBESITY [name] is here to discuss his progress with his obesity treatment plan along with follow-up of his obesity related diagnoses. [name] is on the Category 4 Plan and states he is following his eating plan approximately 75% of the time. [name] states he has increased activity at work.  Today's visit was #: 3 Starting weight: 396 lbs Starting date: 08/02/2020 Today's weight: 389 lbs Today's date: 08/30/2020 Total lbs lost to date: 7 Total lbs lost since last in-office visit: 7  Interim History:  He has adhered to the program fairly well and has lost 7 lbs since his last OV with [place]. He notes he is deviating from the cat 4 plan and eating some carbohydrate such as potatoes and mac and cheese.  He does not like the yogurt or cottage cheese options on the plan.   Subjective:   1. Insulin resistance [name] is not on metformin. He notes some polyphagia. He has irritable bowel syndrome-D so metformin may exacerbate this.     Lab Results  Component Value Date   INSULIN 15.3 08/02/2020   Lab Results  Component Value Date   HGBA1C 5.5 08/02/2020   2. Gastroesophageal reflux disease [name] notes continued symptoms of GERD and reflux.  [name] believes he was referred to our clinic to lose weight to be eligible for surgery to have the parts of his esophagus and stomach removed that were damaged  by chronic GERD. However, he had a recent OV with Dr. Bryan Lemma and the notes reflect that it was suggested that he consider bariatric surgery. Assessment/Plan:   1. Insulin resistance [name] will continue will continue to work on reducing carbohydrates such as potatoes and mac and cheese.   2. Gastroesophageal reflux disease [name] is to make a follow up appointment with Dr. Bryan Lemma. He is to continue Protonix and avoid trigger foods.   3. Class 3 severe obesity with serious comorbidity and body mass index (BMI) of 50.0 to 59.9 in adult, unspecified  obesity type [facility]) [name] is currently in the action stage of change. As such, his goal is to continue with weight loss efforts. He has agreed to the Category 4 Plan.   [name] may drink 2% or chocolate milk rather than skim.  Exercise goals: As is.  Behavioral modification strategies: increasing lean protein intake, decreasing simple carbohydrates and better snacking choices.  [name] has agreed to follow-up with our clinic in 3 weeks at his request due to difficulty getting off work. He was informed of the importance of frequent follow-up visits to maximize his success with intensive lifestyle modifications for his multiple health conditions.   Objective:   Blood pressure 122/74, pulse 82, temperature 97.9 F (36.6 C), temperature source Oral, height _0  (1.727 m), weight (!) 389 lb (176.4 kg), SpO2 99 %. Body mass index is 59.15 kg/m.  General: Cooperative, alert, well developed, in no acute distress. HEENT: Conjunctivae and lids unremarkable. Cardiovascular: Regular rhythm.  Lungs: Normal work of breathing. Neurologic: No focal deficits.   Lab Results  Component Value Date   CREATININE 0.99 08/02/2020   BUN 16 08/02/2020   NA 138 08/02/2020   K 4.3 08/02/2020   CL 101 08/02/2020   CO2 25 08/02/2020   Lab Results  Component Value Date   ALT 19 08/02/2020   AST 13 08/02/2020   ALKPHOS 60 08/02/2020   BILITOT 0.4 08/02/2020   Lab Results  Component Value Date   HGBA1C 5.5 08/02/2020   HGBA1C  5.1 07/07/2018   HGBA1C 5.2 08/19/2013   HGBA1C 5.6 05/03/2012   HGBA1C  03/01/2010    5.9 (NOTE) The ADA recommends the following therapeutic goal for glycemic control related to Hgb A1c measurement: Goal of therapy: <6.5 Hgb A1c  Reference: American Diabetes Association: Clinical Practice Recommendations 2010, Diabetes Care, 2010, 33: (Suppl  1).   Lab Results  Component Value Date   INSULIN 15.3 08/02/2020   Lab Results  Component Value Date   TSH 2.300  08/02/2020   Lab Results  Component Value Date   CHOL 148 08/02/2020   HDL 48 08/02/2020   LDLCALC 83 08/02/2020   TRIG 88 08/02/2020   CHOLHDL 3.9 07/07/2018   Lab Results  Component Value Date   WBC 11.5 (H) 03/22/2020   HGB 15.1 03/22/2020   HCT 45.8 03/22/2020   MCV 85.8 03/22/2020   PLT 300 03/22/2020   Lab Results  Component Value Date   IRON 63 03/01/2020   TIBC 331 03/01/2020   FERRITIN 82 03/01/2020   Attestation Statements:   Reviewed by clinician on day of visit: allergies, medications, problem list, medical history, surgical history, family history, social history, and previous encounter notes.   Wilhemena Durie, am acting as Location manager for [employer], FNP-C.  I have reviewed the above documentation for accuracy and completeness, and I agree with the above. - Georgianne Fick, FNP

## 2020-09-21 ENCOUNTER — Encounter (INDEPENDENT_AMBULATORY_CARE_PROVIDER_SITE_OTHER): Payer: Self-pay | Admitting: Family Medicine

## 2020-09-21 ENCOUNTER — Other Ambulatory Visit: Payer: Self-pay

## 2020-09-21 ENCOUNTER — Ambulatory Visit (INDEPENDENT_AMBULATORY_CARE_PROVIDER_SITE_OTHER): Payer: 59 | Admitting: Family Medicine

## 2020-09-21 VITALS — BP 103/65 | HR 74 | Temp 97.9°F | Ht 68.0 in | Wt 385.0 lb

## 2020-09-21 DIAGNOSIS — Z6841 Body Mass Index (BMI) 40.0 and over, adult: Secondary | ICD-10-CM

## 2020-09-21 DIAGNOSIS — E8881 Metabolic syndrome: Secondary | ICD-10-CM

## 2020-09-21 DIAGNOSIS — K219 Gastro-esophageal reflux disease without esophagitis: Secondary | ICD-10-CM

## 2020-09-21 NOTE — Progress Notes (Signed)
Chief Complaint:   OBESITY [name] is here to discuss his progress with his obesity treatment plan along with follow-up of his obesity related diagnoses. [name] is on the Category 4 Plan and states he is following his eating plan approximately 75% of the time. [name] states he is working (lifting) and aerobics 8 hours 5 times per week.  Today's visit was #: 4 Starting weight: 396 lbs Starting date: 08/02/2020 Today's weight: 385 lbs Today's date: 09/21/2020 Total lbs lost to date: 11 Total lbs lost since last in-office visit: 4  Interim History: [name] notes some hunger between meals. He is on plan at breakfast and lunch. He has cut back on carbs at dinner. He does a good job packing food to take on the road. He is a Administrator.  Subjective:   Gastroesophageal reflux disease, unspecified whether esophagitis present. [name] notes consistent GERD symptoms throughout the day to the point of vomiting. He is on Protonix TID and is compliant. He states TUMS and Maalox do not help.  Insulin resistance. [name] has a diagnosis of insulin resistance based on his elevated fasting insulin level >5.  We discussed insulin resistance in depth and also discussed metformin. I am concerned about exacerbating his GERD if we start metformin.  Lab Results  Component Value Date   INSULIN 15.3 08/02/2020   Lab Results  Component Value Date   HGBA1C 5.5 08/02/2020   Assessment/Plan:   Gastroesophageal reflux disease, unspecified whether esophagitis present.  [name] will follow-up with GI next week as scheduled. He will ask about bariatric surgery, [name] thought Dr. Bryan Lemma recommended some type of surgery to repair damage from GERD rather than bariatric surgery. The note says mentions bariatric surgery.   Insulin resistance.  He will discuss metformin with Dr. Bryan Lemma.  Class 3 severe obesity with serious comorbidity and body mass index (BMI) of 50.0 to 59.9 in  adult, unspecified obesity type ([name]).  Olan is currently in the action stage of change. As such, his goal is to continue with weight loss efforts. He has agreed to the Category 4 Plan and will journal 550-650 calories and 35 grams of protein at supper.   Exercise goals: All adults should avoid inactivity. Some physical activity is better than none, and adults who participate in any amount of physical activity gain some health benefits.  Behavioral modification strategies: increasing lean protein intake and meal planning and cooking strategies.  [name] has agreed to follow-up with our clinic in 3 weeks.  Objective:   Blood pressure 103/65, pulse 74, temperature 97.9 F (36.6 C), height 5\' 8"  (1.727 m), weight (!) 385 lb (174.6 kg), SpO2 99 %. Body mass index is 58.54 kg/m.  General: Cooperative, alert, well developed, in no acute distress. HEENT: Conjunctivae and lids unremarkable. Cardiovascular: Regular rhythm.  Lungs: Normal work of breathing. Neurologic: No focal deficits.   Lab Results  Component Value Date   CREATININE 0.99 08/02/2020   BUN 16 08/02/2020   NA 138 08/02/2020   K 4.3 08/02/2020   CL 101 08/02/2020   CO2 25 08/02/2020   Lab Results  Component Value Date   ALT 19 08/02/2020   AST 13 08/02/2020   ALKPHOS 60 08/02/2020   BILITOT 0.4 08/02/2020   Lab Results  Component Value Date   HGBA1C 5.5 08/02/2020   HGBA1C 5.1 07/07/2018   HGBA1C 5.2 08/19/2013   HGBA1C 5.6 05/03/2012   HGBA1C  03/01/2010    5.9 (NOTE) The ADA recommends the  following therapeutic goal for glycemic control related to Hgb A1c measurement: Goal of therapy: <6.5 Hgb A1c  Reference: American Diabetes Association: Clinical Practice Recommendations 2010, Diabetes Care, 2010, 33: (Suppl  1).   Lab Results  Component Value Date   INSULIN 15.3 08/02/2020   Lab Results  Component Value Date   TSH 2.300 08/02/2020   Lab Results  Component Value Date   CHOL 148 08/02/2020    HDL 48 08/02/2020   LDLCALC 83 08/02/2020   TRIG 88 08/02/2020   CHOLHDL 3.9 07/07/2018   Lab Results  Component Value Date   WBC 11.5 (H) 03/22/2020   HGB 15.1 03/22/2020   HCT 45.8 03/22/2020   MCV 85.8 03/22/2020   PLT 300 03/22/2020   Lab Results  Component Value Date   IRON 63 03/01/2020   TIBC 331 03/01/2020   FERRITIN 82 03/01/2020   Attestation Statements:   Reviewed by clinician on day of visit: allergies, medications, problem list, medical history, surgical history, family history, social history, and previous encounter notes.  [name], am acting as Location manager for [employer], FNP-C   I have reviewed the above documentation for accuracy and completeness, and I agree with the above. -  Phillip Fick, FNP

## 2020-09-28 ENCOUNTER — Other Ambulatory Visit: Payer: Self-pay | Admitting: Physician Assistant

## 2020-09-28 ENCOUNTER — Other Ambulatory Visit: Payer: Self-pay | Admitting: Family Medicine

## 2020-09-28 DIAGNOSIS — K219 Gastro-esophageal reflux disease without esophagitis: Secondary | ICD-10-CM

## 2020-09-28 DIAGNOSIS — M5126 Other intervertebral disc displacement, lumbar region: Secondary | ICD-10-CM

## 2020-09-29 ENCOUNTER — Other Ambulatory Visit: Payer: Self-pay

## 2020-09-29 ENCOUNTER — Encounter: Payer: Self-pay | Admitting: Gastroenterology

## 2020-09-29 ENCOUNTER — Ambulatory Visit (INDEPENDENT_AMBULATORY_CARE_PROVIDER_SITE_OTHER): Payer: Self-pay | Admitting: Gastroenterology

## 2020-09-29 VITALS — BP 128/78 | HR 92 | Ht 71.0 in | Wt 396.0 lb

## 2020-09-29 DIAGNOSIS — K760 Fatty (change of) liver, not elsewhere classified: Secondary | ICD-10-CM

## 2020-09-29 DIAGNOSIS — Z6841 Body Mass Index (BMI) 40.0 and over, adult: Secondary | ICD-10-CM

## 2020-09-29 DIAGNOSIS — K2100 Gastro-esophageal reflux disease with esophagitis, without bleeding: Secondary | ICD-10-CM

## 2020-09-29 DIAGNOSIS — K580 Irritable bowel syndrome with diarrhea: Secondary | ICD-10-CM

## 2020-09-29 NOTE — Patient Instructions (Signed)
If you are age 34 or older, your body mass index should be between 23-30. Your Body mass index is 55.23 kg/m. If this is out of the aforementioned range listed, please consider follow up with your Primary Care Provider.  If you are age 64 or younger, your body mass index should be between 19-25. Your Body mass index is 55.23 kg/m. If this is out of the aformentioned range listed, please consider follow up with your Primary Care Provider.   We are referring you to [facility]. Make certain to bring a list of current medications, including any over the counter medications or vitamins. Also bring your co-pay if you have one as well as your insurance cards. [facility] is located at [address].  They will call you to schedule your appointment.If you do not hear from them within 1 week to schedule please contact us at [phone]  Due to recent changes in healthcare laws, you may see the results of your imaging and laboratory studies on MyChart before your provider has had a chance to review them.  We understand that in some cases there may be results that are confusing or concerning to you. Not all laboratory results come back in the same time frame and the provider may be waiting for multiple results in order to interpret others.  Please give [place] 48 hours in order for your provider to thoroughly review all the results before contacting the office for clarification of your results.   Thank you for choosing me and [facility]  Dr Gerrit Heck

## 2020-09-29 NOTE — Progress Notes (Signed)
P  Chief Complaint:    Abdominal pain, change in bowel habits  GI History: 34 year old Caucasian male with past medical history as listed below including morbid obesity, IBS, GERD, anxiety, bipolar, depression, ADHD, OSA on CPAP, follows in the GI clinic for the following:  1) IBS-D: Lower abdominal pain and diarrhea. EGD/colonoscopy with random rectal biopsies otherwise unremarkable. Improved with increase dietary fiber/fiber bars, but still with intermittent cramping which tends to respond to Bentyl prn.  Previously discussed the role TCA. Has been on multiple SSRIs and venlafaxine in the past; currently prescribed Lamictal and Adderall for bipolar and ADHD.  2) Hemorrhoids:Small grade 2 internal hemorrhoids noted on colonoscopy 10/2019. Intermittent bleeding.  3) GERD withmildesophagitis: Longstanding history of reflux, index symptoms of heartburn, regurgitation.HB andregurgitation generally well controlled with Protonix 40 mgBID, but still with breakthrough atypical symptoms (sour brash, belching). No change with Pepcid 40 mg BID-stopped 04/2020.  No appreciable change with trial of Dexilant, has resumed Protonix.  4) Fatty liver disease: -RUQ [place] (08/2019): Fatty liver infiltration c/whepatic steatosis -CT (03/2020): Mild hepatomegaly 22cm without focal lesion -Normal liver enzymes  Endoscopic history: -EGD (08/2007, Dr. Leonides Schanz): Small hiatal hernia -Colonoscopy (08/2007, Dr. Gala Romney): Normal -EGD (12/2008, Dr. Gala Romney): Distal esophageal erosions consistent with mild erosive reflux esophagitis, small hiatal hernia -EGD (03/2009, Dr. Gala Romney): Small hiatal hernia, otherwise normal -VCE (03/2009, Dr.Rourk): Normal -Colonoscopy (09/2009): Hypertrophied anal papilla, otherwise normal -Colonoscopy (11/02/2019, Dr. Bryan Lemma): Hemorrhoids, 2 mm sessile serrated polyp in ascending colon. Repeat in 5 years -EGD (11/02/2019, Dr. Bryan Lemma): Irregular Z-line (path: Reflux changes, no  Barrett's), benign gastric polyps, mild gastritis -EGD with Bravo placement (06/07/2020, Dr. Bryan Lemma): Mildly irregular Z-line, mild gastritis.  Successful deployment of Bravo  -Bravo study (05/2020): Good acid suppression on PPI (Protonix), with DeMeester score 2.7.  No symptom correlation between symptoms and reflux events.  Overall number of reflux episodes was normal.  -CT abdomen/pelvis (03/14/2020): Mild hepatomegaly, mass normal GI  Aside from father with colon polyps, no known family history of CRC, GI malignancy, liver disease, pancreatic disease, or IBD  HPI:     Patient is a 34 y.o. male presenting to the Gastroenterology Clinic for follow-up.  Last seen by me on 07/13/2020.  At that time, his main issue ongoing reflux symptoms, despite Bravo study showing good control of acid on PPI.  No change with trial of Dexilant.  [facility] to [facility], and has continued to follow regularly.  IBS symptoms were otherwise largely controlled.  Was most recently seen by Irwin Brakeman [facility] on 09/13/[facility].  Has lost approximately 15#over the last few months.  They are considering starting Metformin for insulin resistance.  He would also like a referral to Bariatric Surgery to discuss surgical intervention for both weight loss and antireflux measures.   Review of systems:     No chest pain, no SOB, no fevers, no urinary sx   Past Medical History:  Diagnosis Date  . ADHD (attention deficit hyperactivity disorder)   . Anal fissure   . Anxiety   . Asthma   . Back pain   . Bipolar disorder ([place])   . Chest pain   . Chronic lower back pain   . Constipation   . Depression   . Esophagitis    Distal esophageal erosions consistent with mild erosive  reflux esophagitis 12/2008 by EGD   . External hemorrhoids   . Gastric polyps   . Gastric ulcer   .  GERD (gastroesophageal reflux disease)   . Hepatic steatosis   . Hiatal hernia   . History of  stomach ulcers   . Hx MRSA infection 8/08   right thigh  . Hx of colonic polyps   . Hypercholesteremia    denies  . Hypothyroidism   . Internal hemorrhoids   . Irritable bowel syndrome (IBS)   . Obesity   . Occult GI bleeding 12/2008   Trivial upper GI bleed/uncontrolled GERD by upper endoscopy 12/2008, normal f/u endoscopy 03/2009 with SBCE at that time  . OSA on CPAP   . Pain management   . Pneumonia 09/01/2015   "on ATB still" (09/04/2015)  . S/P colonoscopy 11/10, 10/08   Dr [name]  . Schizophrenia [facility])   . Sleep apnea   . Stomach ulcer   . Thyroid function test abnormal    Noted in 2011 discharge  . Tobacco dipper   . Wears contact lenses     Patient's surgical history, family medical history, social history, medications and allergies were all reviewed in Epic    Current Outpatient Medications  Medication Sig Dispense Refill  . amphetamine-dextroamphetamine (ADDERALL XR) 20 MG 24 hr capsule Take 1 capsule (20 mg total) by mouth daily. 30 capsule 0  . cyclobenzaprine (FLEXERIL) 10 MG tablet Take 1 tablet (10 mg total) by mouth 3 (three) times daily as needed for muscle spasms (use sparingly!). for muscle spams 90 tablet 2  . dicyclomine (BENTYL) 20 MG tablet Take 1 tablet (20 mg total) by mouth 4 (four) times daily -  before meals and at bedtime. 120 tablet 5  . docusate sodium (COLACE) 250 MG capsule Take 250 mg by mouth 2 (two) times daily.    [name] Kitchen EPINEPHrine (EPIPEN 2-PAK) 0.3 mg/0.3 mL IJ SOAJ injection Inject 0.3 mg into the muscle as needed for anaphylaxis.    [name] Kitchen gabapentin (NEURONTIN) 300 MG capsule TAKE 1 CAPSULE BY MOUTH THREE TIMES DAILY 90 capsule 6  . lamoTRIgine (LAMICTAL) 25 MG tablet Take 2 tablets (50 mg total) by mouth 2 (two) times daily. 120 tablet 2  . levothyroxine (EUTHYROX) 75 MCG tablet Take 75 mcg by mouth daily before breakfast.    . pantoprazole (PROTONIX) 40 MG tablet Take 1 tablet by mouth twice daily 180 tablet 0  . Vitamin D,  Ergocalciferol, (DRISDOL) 1.25 MG (50000 UNIT) CAPS capsule Take 1 capsule (50,000 Units total) by mouth every 7 (seven) days. 12 capsule 0   No current facility-administered medications for this visit.    Physical Exam:     BP 128/78   Pulse 92   Ht 5\' 11"  (1.803 m)   Wt (!) 396 lb (179.6 kg)   BMI 55.23 kg/m   GENERAL:  Pleasant male in NAD PSYCH: : Cooperative, normal affect NEURO: Alert and oriented x 3, no focal neurologic deficits   IMPRESSION and PLAN:    1) GERD 2) Morbid obesity -Continue antireflux medications along with lifestyle/dietary modifications as currently doing -Placed referral to Bariatric Surgery today -Continue close follow-up at [facility]  3) Fatty liver disease -Is apparently considering getting started on Metformin for insulin resistance.  Otherwise normal fasting BG and normal A1c.  I will discuss with Dawn Whitmire at [facility] re: using pioglitazone instead given proven benefits and higher liver disease (whereas Metformin does not) -Continue weight loss plan as currently doing -Referral to Bariatrics as above  4) IBS: -Seems to be well controlled on current regimen -Discussed potential  for exacerbation of IBS-D with initiation of Metformin.  Will discuss with prescribing provider as above  I spent 35 minutes of time, including in depth chart review, independent review of results as outlined above, communicating results with the patient directly, face-to-face time with the patient, coordinating care, and ordering studies and medications as appropriate, and documentation.       Lavena Bullion ,DO, FACG 09/29/2020, 8:56 AM

## 2020-10-11 ENCOUNTER — Other Ambulatory Visit: Payer: Self-pay

## 2020-10-11 ENCOUNTER — Ambulatory Visit (INDEPENDENT_AMBULATORY_CARE_PROVIDER_SITE_OTHER): Payer: 59 | Admitting: Family Medicine

## 2020-10-11 ENCOUNTER — Encounter: Payer: Self-pay | Admitting: Family Medicine

## 2020-10-11 ENCOUNTER — Other Ambulatory Visit: Payer: Self-pay | Admitting: Physician Assistant

## 2020-10-11 VITALS — BP 130/74 | HR 77 | Temp 97.5°F | Ht 71.0 in | Wt 388.0 lb

## 2020-10-11 DIAGNOSIS — F902 Attention-deficit hyperactivity disorder, combined type: Secondary | ICD-10-CM

## 2020-10-11 DIAGNOSIS — K588 Other irritable bowel syndrome: Secondary | ICD-10-CM | POA: Diagnosis not present

## 2020-10-11 DIAGNOSIS — Z6841 Body Mass Index (BMI) 40.0 and over, adult: Secondary | ICD-10-CM

## 2020-10-11 DIAGNOSIS — E039 Hypothyroidism, unspecified: Secondary | ICD-10-CM | POA: Diagnosis not present

## 2020-10-11 MED ORDER — AMPHETAMINE-DEXTROAMPHET ER 20 MG PO CP24: 20.0000 mg | ORAL_CAPSULE | Freq: Every day | ORAL | 0 refills | Status: DC

## 2020-10-11 NOTE — Progress Notes (Signed)
Subjective: CC: Follow-up ADHD PCP: [name], DO [id number] [name] is a 34 y.o. male presenting to clinic today for:  1.  ADHD Patient reports compliance with Adderall 20 mg daily.  Denies any constipation, dry mouth, heart palpitations or tremor.  He reports good control of symptoms.  2. Mood disorder Patient reports good control of mood disorder with Lamictal.  3. Hypothyroidism Patient reports compliance with Synthroid 75 mcg daily. No reports of tremor, heart palpitations, unplanned weight changes. He continues to have difficulty with his bowels. There is a possible gastric resection coming up.   ROS: Per HPI  Allergies  Allergen Reactions  . Ibuprofen Other (See Comments)    Makes ulcers bleed  . Influenza Vaccines Shortness Of Breath and Other (See Comments)    Rash and unable to breathe well  . Tylenol [Acetaminophen] Other (See Comments)    Bleeding ulcers  . Bee Venom Swelling    SWELLING REACTION UNSPECIFIED   . Tramadol Itching   Past Medical History:  Diagnosis Date  . ADHD (attention deficit hyperactivity disorder)   . Anal fissure   . Anxiety   . Asthma   . Back pain   . Bipolar disorder ([place])   . Chest pain   . Chronic lower back pain   . Constipation   . Depression   . Esophagitis    Distal esophageal erosions consistent with mild erosive  reflux esophagitis 12/2008 by EGD   . External hemorrhoids   . Gastric polyps   . Gastric ulcer   . GERD (gastroesophageal reflux disease)   . Hepatic steatosis   . Hiatal hernia   . History of stomach ulcers   . Hx MRSA infection 8/08   right thigh  . Hx of colonic polyps   . Hypercholesteremia    denies  . Hypothyroidism   . Internal hemorrhoids   . Irritable bowel syndrome (IBS)   . Obesity   . Occult GI bleeding 12/2008   Trivial upper GI bleed/uncontrolled GERD by upper endoscopy 12/2008, normal f/u endoscopy 03/2009 with SBCE at that time  . OSA on CPAP   . Pain management   .  Pneumonia 09/01/2015   "on ATB still" (09/04/2015)  . S/P colonoscopy 11/10, 10/08   Dr [name]  . Schizophrenia [facility])   . Sleep apnea   . Stomach ulcer   . Thyroid function test abnormal    Noted in 2011 discharge  . Tobacco dipper   . Wears contact lenses     Current Outpatient Medications:  .  amphetamine-dextroamphetamine (ADDERALL XR) 20 MG 24 hr capsule, Take 1 capsule (20 mg total) by mouth daily., Disp: 30 capsule, Rfl: 0 .  cyclobenzaprine (FLEXERIL) 10 MG tablet, TAKE 1 TABLET BY MOUTH THREE TIMES DAILY AS NEEDED FOR MUSCLE SPASMS. USE SPARLINGLY, Disp: 90 tablet, Rfl: 0 .  dicyclomine (BENTYL) 20 MG tablet, Take 1 tablet (20 mg total) by mouth 4 (four) times daily -  before meals and at bedtime., Disp: 120 tablet, Rfl: 5 .  docusate sodium (COLACE) 250 MG capsule, Take 250 mg by mouth 2 (two) times daily., Disp: , Rfl:  .  EPINEPHrine (EPIPEN 2-PAK) 0.3 mg/0.3 mL IJ SOAJ injection, Inject 0.3 mg into the muscle as needed for anaphylaxis., Disp: , Rfl:  .  gabapentin (NEURONTIN) 300 MG capsule, TAKE 1 CAPSULE BY MOUTH THREE TIMES DAILY, Disp: 90 capsule, Rfl: 6 .  lamoTRIgine (LAMICTAL) 25 MG tablet, Take 2 tablets (50 mg total) by  mouth 2 (two) times daily., Disp: 120 tablet, Rfl: 2 .  levothyroxine (EUTHYROX) 75 MCG tablet, Take 75 mcg by mouth daily before breakfast., Disp: , Rfl:  .  pantoprazole (PROTONIX) 40 MG tablet, Take 1 tablet by mouth twice daily, Disp: 180 tablet, Rfl: 0 .  Vitamin D, Ergocalciferol, (DRISDOL) 1.25 MG (50000 UNIT) CAPS capsule, Take 1 capsule (50,000 Units total) by mouth every 7 (seven) days., Disp: 12 capsule, Rfl: 0 Social History   Socioeconomic History  . Marital status: Married    Spouse name: Lawerance Bach  . Number of children: 2  . Years of education: Not on file  . Highest education level: Not on file  Occupational History  . Occupation: Systems analyst: [employer]  . Occupation: truck Geophysicist/field seismologist  Tobacco Use  . Smoking  status: Never Smoker  . Smokeless tobacco: Current User    Types: Snuff  . Tobacco comment: quitting snuff. whinning off   Vaping Use  . Vaping Use: Never used  Substance and Sexual Activity  . Alcohol use: Yes    Alcohol/week: 0.0 standard drinks    Comment: rare  . Drug use: No  . Sexual activity: Not Currently  Other Topics Concern  . Not on file  Social History Narrative  . Not on file   Social Determinants of Health   Financial Resource Strain:   . Difficulty of Paying Living Expenses: Not on file  Food Insecurity:   . Worried About Charity fundraiser in the Last Year: Not on file  . Ran Out of Food in the Last Year: Not on file  Transportation Needs:   . Lack of Transportation (Medical): Not on file  . Lack of Transportation (Non-Medical): Not on file  Physical Activity:   . Days of Exercise per Week: Not on file  . Minutes of Exercise per Session: Not on file  Stress:   . Feeling of Stress : Not on file  Social Connections:   . Frequency of Communication with Friends and Family: Not on file  . Frequency of Social Gatherings with Friends and Family: Not on file  . Attends Religious Services: Not on file  . Active Member of Clubs or Organizations: Not on file  . Attends Archivist Meetings: Not on file  . Marital Status: Not on file  Intimate Partner Violence:   . Fear of Current or Ex-Partner: Not on file  . Emotionally Abused: Not on file  . Physically Abused: Not on file  . Sexually Abused: Not on file   Family History  Problem Relation Age of Onset  . Leukemia Father 85  . Colon polyps Father   . Clotting disorder Father   . Heart disease Father   . Cancer Father   . Depression Father   . Sleep apnea Father   . Obesity Father   . Seizures Mother   . Irritable bowel syndrome Mother   . Thyroid disease Mother   . Depression Mother   . Anxiety disorder Mother   . Bipolar disorder Brother   . ADD / ADHD Brother   . Diabetes Paternal  Grandmother   . Ulcerative colitis Paternal Aunt   . Colon cancer Neg Hx   . Liver cancer Neg Hx   . Esophageal cancer Neg Hx   . Stomach cancer Neg Hx   . Pancreatic cancer Neg Hx     Objective: Office vital signs reviewed. BP 130/74   Pulse 77   Temp (!)  97.5 F (36.4 C) (Temporal)   Ht 5\' 11"  (1.803 m)   Wt (!) 388 lb (176 kg)   SpO2 98%   BMI 54.12 kg/m   Physical Examination:  General: Awake, alert, morbidly obese, No acute distress HEENT: Normal; sclera white Cardio: regular rate and rhythm, S1S2 heard, no murmurs appreciated Pulm: clear to auscultation bilaterally, no wheezes, rhonchi or rales; normal work of breathing on room air Psych: Mood stable, speech normal  Assessment/ Plan: 34 y.o. male   Attention deficit hyperactivity disorder (ADHD), combined type - Plan: amphetamine-dextroamphetamine (ADDERALL XR) 20 MG 24 hr capsule, amphetamine-dextroamphetamine (ADDERALL XR) 20 MG 24 hr capsule, amphetamine-dextroamphetamine (ADDERALL XR) 20 MG 24 hr capsule  Class 3 severe obesity with serious comorbidity and body mass index (BMI) of 50.0 to 59.9 in adult, unspecified obesity type ([facility])  Other irritable bowel syndrome  Acquired hypothyroidism  ADHD well-controlled current regimen. National narcotic database was reviewed and there were no red flags. Prescription sent Patient is to continue following up with Dr. Leafy Ro as prescribed Continue following up with gastroenterology. Possible upcoming surgery He remains symptomatic from a GI standpoint but otherwise has had no unusual symptoms to suggest uncontrolled thyroid levels  No orders of the defined types were placed in this encounter.  No orders of the defined types were placed in this encounter.    [name], DO May [id number]

## 2020-10-12 ENCOUNTER — Encounter (INDEPENDENT_AMBULATORY_CARE_PROVIDER_SITE_OTHER): Payer: Self-pay | Admitting: Family Medicine

## 2020-10-12 ENCOUNTER — Ambulatory Visit (INDEPENDENT_AMBULATORY_CARE_PROVIDER_SITE_OTHER): Payer: 59 | Admitting: Family Medicine

## 2020-10-12 VITALS — BP 120/72 | HR 91 | Temp 97.6°F | Ht 68.0 in | Wt 381.0 lb

## 2020-10-12 DIAGNOSIS — E8881 Metabolic syndrome: Secondary | ICD-10-CM

## 2020-10-12 DIAGNOSIS — K580 Irritable bowel syndrome with diarrhea: Secondary | ICD-10-CM

## 2020-10-12 DIAGNOSIS — E88819 Insulin resistance, unspecified: Secondary | ICD-10-CM

## 2020-10-12 DIAGNOSIS — Z6841 Body Mass Index (BMI) 40.0 and over, adult: Secondary | ICD-10-CM | POA: Diagnosis not present

## 2020-10-12 DIAGNOSIS — Z9189 Other specified personal risk factors, not elsewhere classified: Secondary | ICD-10-CM | POA: Diagnosis not present

## 2020-10-12 DIAGNOSIS — K588 Other irritable bowel syndrome: Secondary | ICD-10-CM

## 2020-10-12 MED ORDER — METFORMIN HCL 500 MG PO TABS: 500.0000 mg | ORAL_TABLET | Freq: Every day | ORAL | 0 refills | Status: DC

## 2020-10-16 ENCOUNTER — Encounter (INDEPENDENT_AMBULATORY_CARE_PROVIDER_SITE_OTHER): Payer: Self-pay | Admitting: Family Medicine

## 2020-10-16 NOTE — Progress Notes (Signed)
Chief Complaint:   OBESITY [name] is here to discuss his progress with his obesity treatment plan along with follow-up of his obesity related diagnoses. [name] is on the Category 4 Plan and keeping a food journal and adhering to recommended goals of 550-650 calories and 35 grams of protein at supper daily and states he is following his eating plan approximately 70-75% of the time. [name] states he is doing 0 minutes 0 times per week.  Today's visit was #: 5 Starting weight: 396 lbs  Starting date: 08/02/2020 Today's weight: 381 lbs Today's date: 10/12/2020 Total lbs lost to date: 15 Total lbs lost since last in-office visit: 4  Interim History: [name] notes hunger in the morning and sometimes after supper. He does like to have a  "Nutty Buddy" after supper. He is eating all of the food on the plan. He has one can of Dr. Malachi Bonds per day.   Subjective:   1. Insulin resistance [name] has irritable bowel syndrome with diarrhea. He notes he has hunger in the morning. We will try metformin.   Lab Results  Component Value Date   INSULIN 15.3 08/02/2020   Lab Results  Component Value Date   HGBA1C 5.5 08/02/2020   2. Other irritable bowel syndrome [name] notes he mostly has diarrhea, but he does occasionally have constipation.  3. At risk for side effect of medication [name] is at for drug side effects due to new prescription of metformin.  Assessment/Plan:   1. Insulin resistance [name] agreed to start metformin 500 mg q AM with no refill. He may take 1/2 pill if he has diarrhea. - metFORMIN (GLUCOPHAGE) 500 MG tablet; Take 1 tablet (500 mg total) by mouth daily with breakfast.  Dispense: 30 tablet; Refill: 0  2. Other irritable bowel syndrome [name] will continue to follow up with GI as needed.  3. At risk for side effect of medication [name] was given approximately 15 minutes of drug side effect counseling today.  We discussed side effect possibility  and risk versus benefits. [name] agreed to the medication and will contact this office if these side effects are intolerable.  Repetitive spaced learning was employed today to elicit superior memory formation and behavioral change.  4. Class 3 severe obesity with serious comorbidity and body mass index (BMI) of 50.0 to 59.9 in adult, unspecified obesity type [name]) [name] is currently in the action stage of change. As such, his goal is to continue with weight loss efforts. He has agreed to the Category 4 Plan plus 200 and keeping a food journal and adhering to recommended goals of 550-650 calories and 35 grams of protein at supper daily.   [name] may have a protein snack in the morning, and try to delay lunch until 1 pm. He is to keep extra calories at <500 per day.  Exercise goals: No exercise has been prescribed at this time.  Behavioral modification strategies: decreasing simple carbohydrates and decreasing liquid calories.  [name] has agreed to follow-up with our clinic in 3 weeks.   Objective:   Blood pressure 120/72, pulse 91, temperature 97.6 F (36.4 C), height 5\' 8"  (1.727 m), weight (!) 381 lb (172.8 kg), SpO2 98 %. Body mass index is 57.93 kg/m.  General: Cooperative, alert, well developed, in no acute distress. HEENT: Conjunctivae and lids unremarkable. Cardiovascular: Regular rhythm.  Lungs: Normal work of breathing. Neurologic: No focal deficits.   Lab Results  Component Value Date   CREATININE 0.99 08/02/2020   BUN 16  08/02/2020   NA 138 08/02/2020   K 4.3 08/02/2020   CL 101 08/02/2020   CO2 25 08/02/2020   Lab Results  Component Value Date   ALT 19 08/02/2020   AST 13 08/02/2020   ALKPHOS 60 08/02/2020   BILITOT 0.4 08/02/2020   Lab Results  Component Value Date   HGBA1C 5.5 08/02/2020   HGBA1C 5.1 07/07/2018   HGBA1C 5.2 08/19/2013   HGBA1C 5.6 05/03/2012   HGBA1C  03/01/2010    5.9 (NOTE) The ADA recommends the following therapeutic goal  for glycemic control related to Hgb A1c measurement: Goal of therapy: <6.5 Hgb A1c  Reference: American Diabetes Association: Clinical Practice Recommendations 2010, Diabetes Care, 2010, 33: (Suppl  1).   Lab Results  Component Value Date   INSULIN 15.3 08/02/2020   Lab Results  Component Value Date   TSH 2.300 08/02/2020   Lab Results  Component Value Date   CHOL 148 08/02/2020   HDL 48 08/02/2020   LDLCALC 83 08/02/2020   TRIG 88 08/02/2020   CHOLHDL 3.9 07/07/2018   Lab Results  Component Value Date   WBC 11.5 (H) 03/22/2020   HGB 15.1 03/22/2020   HCT 45.8 03/22/2020   MCV 85.8 03/22/2020   PLT 300 03/22/2020   Lab Results  Component Value Date   IRON 63 03/01/2020   TIBC 331 03/01/2020   FERRITIN 82 03/01/2020   Attestation Statements:   Reviewed by clinician on day of visit: allergies, medications, problem list, medical history, surgical history, family history, social history, and previous encounter notes.   Wilhemena Durie, am acting as Location manager for [employer], FNP-C.  I have reviewed the above documentation for accuracy and completeness, and I agree with the above. -  Georgianne Fick, FNP

## 2020-10-17 ENCOUNTER — Telehealth: Payer: Self-pay | Admitting: Gastroenterology

## 2020-10-17 NOTE — Telephone Encounter (Signed)
[place] with [facility] called stating the patient called them to schedule appt to discuss bariatric surgery.  However they do not have a referral from [place].  Please fax referral to [phone].

## 2020-10-17 NOTE — Telephone Encounter (Signed)
Is this something you are working on? 

## 2020-10-18 NOTE — Telephone Encounter (Signed)
Fax sent to [facility] on 10/02/2020 at 17:19. Transmittal shows successful. Will send again.

## 2020-10-18 NOTE — Telephone Encounter (Signed)
Spoke with [place] at [facility] to advise her referral went through per fax transmittal to be on the look out for it.

## 2020-10-24 ENCOUNTER — Encounter: Payer: Self-pay | Admitting: Family Medicine

## 2020-10-24 ENCOUNTER — Other Ambulatory Visit (INDEPENDENT_AMBULATORY_CARE_PROVIDER_SITE_OTHER): Payer: Self-pay | Admitting: Bariatrics

## 2020-10-24 ENCOUNTER — Other Ambulatory Visit: Payer: Self-pay | Admitting: Family Medicine

## 2020-10-24 DIAGNOSIS — M5126 Other intervertebral disc displacement, lumbar region: Secondary | ICD-10-CM

## 2020-10-24 DIAGNOSIS — F3177 Bipolar disorder, in partial remission, most recent episode mixed: Secondary | ICD-10-CM

## 2020-10-24 DIAGNOSIS — E559 Vitamin D deficiency, unspecified: Secondary | ICD-10-CM

## 2020-10-24 NOTE — Telephone Encounter (Signed)
Last saw [facility]

## 2020-10-24 NOTE — Telephone Encounter (Signed)
Refill request

## 2020-11-06 ENCOUNTER — Other Ambulatory Visit: Payer: Self-pay

## 2020-11-06 ENCOUNTER — Ambulatory Visit (INDEPENDENT_AMBULATORY_CARE_PROVIDER_SITE_OTHER): Payer: 59 | Admitting: Family Medicine

## 2020-11-06 ENCOUNTER — Encounter (INDEPENDENT_AMBULATORY_CARE_PROVIDER_SITE_OTHER): Payer: Self-pay | Admitting: Family Medicine

## 2020-11-06 VITALS — BP 128/83 | HR 104 | Temp 97.7°F | Ht 68.0 in | Wt 380.0 lb

## 2020-11-06 DIAGNOSIS — E8881 Metabolic syndrome: Secondary | ICD-10-CM

## 2020-11-06 DIAGNOSIS — Z6841 Body Mass Index (BMI) 40.0 and over, adult: Secondary | ICD-10-CM | POA: Diagnosis not present

## 2020-11-06 NOTE — Progress Notes (Signed)
Chief Complaint:   OBESITY [name] is here to discuss his progress with his obesity treatment plan along with follow-up of his obesity related diagnoses. [name] is on the Category 4 Plan + 200 calories and keeping a food journal and adhering to recommended goals of 550-650 calories and 35 grams of protein at supper daily and states he is following his eating plan approximately 70% of the time. 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   Today's visit was #: 6 Starting weight: 396 lbs Starting date: 08/02/2020 Today's weight: 380 lbs Today's date: 11/06/2020 Total lbs lost to date: 16 Total lbs lost since last in-office visit: 1  Interim History: [name] has completed the bariatric surgery seminar and completed the packet. He is now waiting to get an appointment with one of the surgeons.  He reports he has had a few cookies. He also notes skipping lunch on some work days. His wife has been in the hospital and he has been out off his normal pattern of eating. He still has one 12 oz soda per day.  Subjective:   1. Insulin resistance [name] was started on metformin at his last office visit, and he is tolerating it well. He notes less hunger overall.  Lab Results  Component Value Date   INSULIN 15.3 08/02/2020   Lab Results  Component Value Date   HGBA1C 5.5 08/02/2020   Assessment/Plan:   1. Insulin resistance [name] will continue metformin, and will continue to work on weight loss, exercise, and decreasing simple carbohydrates to help decrease the risk of diabetes.  2. Class 3 severe obesity with serious comorbidity and body mass index (BMI) of 50.0 to 59.9 in adult, unspecified obesity type [name]) [name] is currently in the action stage of change. As such, his goal is to continue with weight loss efforts. He has agreed to the Category 4 Plan + 200 calories.   [name] will eat all his food on the plan, even if it  is late at night.  Exercise goals: As is.  Behavioral modification strategies: increasing lean protein intake, decreasing eating out and no skipping meals.  [name] has agreed to follow-up with our clinic in 3 weeks.   Objective:   Blood pressure 128/83, pulse (!) 104, temperature 97.7 F (36.5 C), height 5\' 8"  (1.727 m), weight (!) 380 lb (172.4 kg), SpO2 100 %. Body mass index is 57.78 kg/m.  General: Cooperative, alert, well developed, in no acute distress. HEENT: Conjunctivae and lids unremarkable. Cardiovascular: Regular rhythm.  Lungs: Normal work of breathing. Neurologic: No focal deficits.   Lab Results  Component Value Date   CREATININE 0.99 08/02/2020   BUN 16 08/02/2020   NA 138 08/02/2020   K 4.3 08/02/2020   CL 101 08/02/2020   CO2 25 08/02/2020   Lab Results  Component Value Date   ALT 19 08/02/2020   AST 13 08/02/2020   ALKPHOS 60 08/02/2020   BILITOT 0.4 08/02/2020   Lab Results  Component Value Date   HGBA1C 5.5 08/02/2020   HGBA1C 5.1 07/07/2018   HGBA1C 5.2 08/19/2013   HGBA1C 5.6 05/03/2012   HGBA1C  03/01/2010    5.9 (NOTE) The ADA recommends the following therapeutic goal for glycemic control related to Hgb A1c measurement: Goal of therapy: <6.5 Hgb A1c  Reference: American Diabetes Association: Clinical Practice Recommendations 2010, Diabetes Care, 2010, 33: (Suppl  1).   Lab Results  Component Value Date  INSULIN 15.3 08/02/2020   Lab Results  Component Value Date   TSH 2.300 08/02/2020   Lab Results  Component Value Date   CHOL 148 08/02/2020   HDL 48 08/02/2020   LDLCALC 83 08/02/2020   TRIG 88 08/02/2020   CHOLHDL 3.9 07/07/2018   Lab Results  Component Value Date   WBC 11.5 (H) 03/22/2020   HGB 15.1 03/22/2020   HCT 45.8 03/22/2020   MCV 85.8 03/22/2020   PLT 300 03/22/2020   Lab Results  Component Value Date   IRON 63 03/01/2020   TIBC 331 03/01/2020   FERRITIN 82 03/01/2020   Attestation Statements:    Reviewed by clinician on day of visit: allergies, medications, problem list, medical history, surgical history, family history, social history, and previous encounter notes.   Wilhemena Durie, am acting as Location manager for [employer], FNP-C.  I have reviewed the above documentation for accuracy and completeness, and I agree with the above. -  Phillip Fick, FNP

## 2020-11-07 ENCOUNTER — Encounter (INDEPENDENT_AMBULATORY_CARE_PROVIDER_SITE_OTHER): Payer: Self-pay | Admitting: Family Medicine

## 2020-11-07 ENCOUNTER — Other Ambulatory Visit (INDEPENDENT_AMBULATORY_CARE_PROVIDER_SITE_OTHER): Payer: Self-pay | Admitting: Family Medicine

## 2020-11-07 DIAGNOSIS — E8881 Metabolic syndrome: Secondary | ICD-10-CM

## 2020-11-14 ENCOUNTER — Other Ambulatory Visit: Payer: Self-pay | Admitting: Family Medicine

## 2020-11-23 ENCOUNTER — Ambulatory Visit
Admission: EM | Admit: 2020-11-23 | Discharge: 2020-11-23 | Disposition: A | Discharge status: Home / Self Care | Payer: 59 | Attending: Family Medicine | Admitting: Family Medicine

## 2020-11-23 ENCOUNTER — Other Ambulatory Visit: Payer: Self-pay

## 2020-11-23 ENCOUNTER — Encounter: Payer: Self-pay | Admitting: Emergency Medicine

## 2020-11-23 DIAGNOSIS — B349 Viral infection, unspecified: Secondary | ICD-10-CM | POA: Diagnosis not present

## 2020-11-23 DIAGNOSIS — R509 Fever, unspecified: Secondary | ICD-10-CM | POA: Diagnosis not present

## 2020-11-23 DIAGNOSIS — R6883 Chills (without fever): Secondary | ICD-10-CM

## 2020-11-23 DIAGNOSIS — R52 Pain, unspecified: Secondary | ICD-10-CM | POA: Diagnosis not present

## 2020-11-23 DIAGNOSIS — R0602 Shortness of breath: Secondary | ICD-10-CM

## 2020-11-23 DIAGNOSIS — R519 Headache, unspecified: Secondary | ICD-10-CM | POA: Diagnosis not present

## 2020-11-23 DIAGNOSIS — R059 Cough, unspecified: Secondary | ICD-10-CM

## 2020-11-23 MED ORDER — DEXAMETHASONE SODIUM PHOSPHATE 10 MG/ML IJ SOLN
10.0000 mg | Freq: Once | INTRAMUSCULAR | Status: AC
  Administered 2020-11-23: 10 mg via INTRAMUSCULAR

## 2020-11-23 MED ORDER — PROMETHAZINE-DM 6.25-15 MG/5ML PO SYRP: 5.0000 mL | ORAL_SOLUTION | Freq: Four times a day (QID) | ORAL | 0 refills | Status: DC | PRN

## 2020-11-23 MED ORDER — PREDNISONE 10 MG (21) PO TBPK: ORAL_TABLET | Freq: Every day | ORAL | 0 refills | Status: AC

## 2020-11-23 NOTE — ED Provider Notes (Signed)
[facility]   [id number] 11/23/20 Arrival Time: 29   CC: COVID symptoms  SUBJECTIVE: History from: patient.  [name] is a 34 y.o. male who presents with abrupt onset of nasal congestion, PND, cough, SOB, body aches, chills, fatigue, for the last 3 days. Denies sick exposure to COVID, flu or strep. Denies recent travel. Has negative history of Covid. Has not completed Covid vaccines. Has taken ibuprofen and tylenol with temporary relief. There are no aggravating or alleviating factors. Denies previous symptoms in the past. Denies fever, sinus pain, rhinorrhea, sore throat, wheezing, chest pain, nausea, changes in bowel or bladder habits.    ROS: As per HPI.  All other pertinent ROS negative.     Past Medical History:  Diagnosis Date   ADHD (attention deficit hyperactivity disorder)    Anal fissure    Anxiety    Asthma    Back pain    Bipolar disorder (HCC)    Chest pain    Chronic lower back pain    Constipation    Depression    Esophagitis    Distal esophageal erosions consistent with mild erosive  reflux esophagitis 12/2008 by EGD    External hemorrhoids    Gastric polyps    Gastric ulcer    GERD (gastroesophageal reflux disease)    Hepatic steatosis    Hiatal hernia    History of stomach ulcers    Hx MRSA infection 8/08   right thigh   Hx of colonic polyps    Hypercholesteremia    denies   Hypothyroidism    Internal hemorrhoids    Irritable bowel syndrome (IBS)    Obesity    Occult GI bleeding 12/2008   Trivial upper GI bleed/uncontrolled GERD by upper endoscopy 12/2008, normal f/u endoscopy 03/2009 with SBCE at that time   OSA on CPAP    Pain management    Pneumonia 09/01/2015   "on ATB still" (09/04/2015)   S/P colonoscopy 11/10, 10/08   Dr [name]   Schizophrenia [facility])    Sleep apnea    Stomach ulcer    Thyroid function test abnormal    Noted in 2011 discharge   Tobacco dipper    Wears  contact lenses    Past Surgical History:  Procedure Laterality Date   ANKLE FRACTURE SURGERY Left ~ 2008   BACK SURGERY     BIOPSY  11/02/2019   Procedure: BIOPSY;  Surgeon: Lavena Bullion, DO;  Location: [facility];  Service: Gastroenterology;;  EGD and Colon   BIOPSY  06/07/2020   Procedure: BIOPSY;  Surgeon: Lavena Bullion, DO;  Location: [facility];  Service: Gastroenterology;;   Franki [place] STUDY N/A 06/07/2020   Procedure: BRAVO Barry STUDY on PPI;  Surgeon: Lavena Bullion, DO;  Location: [facility];  Service: Gastroenterology;  Laterality: N/A;   COLONOSCOPY  10/10/2009   anal papilla otherwise normal   COLONOSCOPY WITH PROPOFOL N/A 11/02/2019   Procedure: COLONOSCOPY WITH PROPOFOL;  Surgeon: Lavena Bullion, DO;  Location: [facility];  Service: Gastroenterology;  Laterality: N/A;   ESOPHAGOGASTRODUODENOSCOPY   04/07/2009   Normal esophagus, small hiatal hernia   ESOPHAGOGASTRODUODENOSCOPY  01/09/2009   Distal esophageal erosions consistent with mild erosive reflux esophagitis, otherwise normal esophagus, small hiatal herniaotherwise normal stomach, D1-D2    ESOPHAGOGASTRODUODENOSCOPY  08/26/2007   Normal esophagus, a small hiatal/hernia, otherwise normal stomach D1 through D3   ESOPHAGOGASTRODUODENOSCOPY (EGD) WITH PROPOFOL N/A 11/02/2019   Procedure: ESOPHAGOGASTRODUODENOSCOPY (EGD) WITH PROPOFOL;  Surgeon: Bryan Lemma,  Verlin Dike, DO;  Location: [facility];  Service: Gastroenterology;  Laterality: N/A;   ESOPHAGOGASTRODUODENOSCOPY (EGD) WITH PROPOFOL N/A 06/07/2020   Procedure: ESOPHAGOGASTRODUODENOSCOPY (EGD) WITH PROPOFOL;  Surgeon: Shellia Cleverly, DO;  Location: [facility];  Service: Gastroenterology;  Laterality: N/A;   FRACTURE SURGERY     ileocolonoscopy  08/26/2007    A normal rectum, colon, and terminal ileum   INCISION AND DRAINAGE  09/04/2015   "reopened my back incsion"   LUMBAR LAMINECTOMY/DECOMPRESSION MICRODISCECTOMY Left 09/23/2017    Procedure: Microdiscectomy - left - Lumbar four-Lumbar five;  Surgeon: Julio Sicks, MD;  Location[facility] OR;  Service: Neurosurgery;  Laterality: Left;   LUMBAR MICRODISCECTOMY  08/21/2015   LUMBAR MICRODISCECTOMY Left 09/23/2017   L4-5   LUMBAR WOUND DEBRIDEMENT N/A 09/04/2015   Procedure: LUMBAR WOUND DEBRIDEMENT;  Surgeon: Lisbeth Renshaw, MD;  Location: [facility];  Service: Neurosurgery;  Laterality: N/A;   right side sugery     enlarged lymph node gland removed under arm pit age 73-5 years   Small bowel capsule  04/11/2009    normal throughout   VASECTOMY     Allergies  Allergen Reactions   Ibuprofen Other (See Comments)    Makes ulcers bleed   Influenza Vaccines Shortness Of Breath and Other (See Comments)    Rash and unable to breathe well   Tylenol [Acetaminophen] Other (See Comments)    Bleeding ulcers   Bee Venom Swelling    SWELLING REACTION UNSPECIFIED    Tramadol Itching   No current facility-administered medications on file prior to encounter.   Current Outpatient Medications on File Prior to Encounter  Medication Sig Dispense Refill   [START ON 12/27/2020] amphetamine-dextroamphetamine (ADDERALL XR) 20 MG 24 hr capsule Take 1 capsule (20 mg total) by mouth daily. 30 capsule 0   [START ON 11/27/2020] amphetamine-dextroamphetamine (ADDERALL XR) 20 MG 24 hr capsule Take 1 capsule (20 mg total) by mouth daily. 30 capsule 0   amphetamine-dextroamphetamine (ADDERALL XR) 20 MG 24 hr capsule Take 1 capsule (20 mg total) by mouth daily. 30 capsule 0   cyclobenzaprine (FLEXERIL) 10 MG tablet TAKE 1 TABLET BY MOUTH THREE TIMES DAILY AS NEEDED FOR MUSCLE SPASMS USE SPARINGLY 90 tablet 0   dicyclomine (BENTYL) 20 MG tablet TAKE 1 TABLET BY MOUTH 4 TIMES DAILY BEFORE MEAL(S) AND AT BEDTIME 120 tablet 1   docusate sodium (COLACE) 250 MG capsule Take 250 mg by mouth 2 (two) times daily.     EPINEPHrine (EPIPEN 2-PAK) 0.3 mg/0.3 mL IJ SOAJ injection Inject 0.3 mg into  the muscle as needed for anaphylaxis.     EUTHYROX 75 MCG tablet TAKE 1 TABLET BY MOUTH ONCE DAILY BEFORE BREAKFAST 90 tablet 1   gabapentin (NEURONTIN) 300 MG capsule TAKE 1 CAPSULE BY MOUTH THREE TIMES DAILY 90 capsule 6   lamoTRIgine (LAMICTAL) 25 MG tablet Take 2 tablets by mouth twice daily 120 tablet 2   metFORMIN (GLUCOPHAGE) 500 MG tablet Take 1 tablet by mouth once daily with breakfast 30 tablet 0   pantoprazole (PROTONIX) 40 MG tablet Take 1 tablet by mouth twice daily 180 tablet 0   Vitamin D, Ergocalciferol, (DRISDOL) 1.25 MG (50000 UNIT) CAPS capsule TAKE 1 CAPSULE BY MOUTH ONCE A WEEK EVERY  7  DAYS 12 capsule 0   Social History   Socioeconomic History   Marital status: Married    Spouse name: Tabitha   Number of children: 2   Years of education: Not on file   Highest  education level: Not on file  Occupational History   Occupation: vol fireman    Employer: [employer]   Occupation: truck driver  Tobacco Use   Smoking status: Never Smoker   Smokeless tobacco: Current User    Types: Snuff   Tobacco comment: quitting snuff. whinning off   Vaping Use   Vaping Use: Never used  Substance and Sexual Activity   Alcohol use: Yes    Alcohol/week: 0.0 standard drinks    Comment: rare   Drug use: No   Sexual activity: Not Currently  Other Topics Concern   Not on file  Social History Narrative   Not on file   Social Determinants of Health   Financial Resource Strain: Not on file  Food Insecurity: Not on file  Transportation Needs: Not on file  Physical Activity: Not on file  Stress: Not on file  Social Connections: Not on file  Intimate Partner Violence: Not on file   Family History  Problem Relation Age of Onset   Leukemia Father 2   Colon polyps Father    Clotting disorder Father    Heart disease Father    Cancer Father    Depression Father    Sleep apnea Father    Obesity Father    Seizures Mother    Irritable bowel  syndrome Mother    Thyroid disease Mother    Depression Mother    Anxiety disorder Mother    Bipolar disorder Brother    ADD / ADHD Brother    Diabetes Paternal Grandmother    Ulcerative colitis Paternal Aunt    Colon cancer Neg Hx    Liver cancer Neg Hx    Esophageal cancer Neg Hx    Stomach cancer Neg Hx    Pancreatic cancer Neg Hx     OBJECTIVE:  Vitals:   11/23/20 1805 11/23/20 1806  BP:  119/81  Pulse:  90  Resp:  19  Temp:  98.9 F (37.2 C)  TempSrc:  Oral  SpO2:  98%  Weight: (!) 380 lb (172.4 kg)   Height: 5\' 11"  (1.803 m)      General appearance: alert; appears fatigued, but nontoxic; speaking in full sentences and tolerating own secretions HEENT: NCAT; Ears: EACs clear, TMs pearly gray; Eyes: PERRL.  EOM grossly intact. Sinuses: nontender; Nose: nares patent without rhinorrhea, Throat: oropharynx erythematous, cobblestoning presetn, tonsils non erythematous or enlarged, uvula erythematous and swollen Neck: supple without LAD Lungs: unlabored respirations, symmetrical air entry; cough: moderate; no respiratory distress; CTAB Heart: regular rate and rhythm.  Radial pulses 2+ symmetrical bilaterally Skin: warm and dry Psychological: alert and cooperative; normal mood and affect  LABS:  No results found for this or any previous visit (from the past 24 hour(s)).   ASSESSMENT & PLAN:  1. Viral illness   2. Nonintractable headache, unspecified chronicity pattern, unspecified headache type   3. Body aches   4. Fever, unspecified fever cause   5. Chills   6. SOB (shortness of breath)   7. Cough     Meds ordered this encounter  Medications   dexamethasone (DECADRON) injection 10 mg   promethazine-dextromethorphan (PROMETHAZINE-DM) 6.25-15 MG/5ML syrup    Sig: Take 5 mLs by mouth 4 (four) times daily as needed for cough.    Dispense:  118 mL    Refill:  0    Order Specific Question:   Supervising Provider    Answer:   Merrilee Jansky  [[id number]]   predniSONE (STERAPRED UNI-PAK 21  TAB) 10 MG (21) TBPK tablet    Sig: Take by mouth daily for 6 days. Take 6 tablets on day 1, 5 tablets on day 2, 4 tablets on day 3, 3 tablets on day 4, 2 tablets on day 5, 1 tablet on day 6    Dispense:  21 tablet    Refill:  0    Order Specific Question:   Supervising Provider    Answer:   Chase Picket [id number]   Decadron 10mg  IM in office today  Steroid taper prescribed Cough syrup prescribed Sedation precautions given  Continue supportive care at home COVID and flu testing ordered.  It will take between 1-2 days for test results.  Someone will contact you regarding abnormal results.   Work note provided Patient should remain in quarantine until they have received Covid results.  If negative you may resume normal activities (go back to work/school) while practicing hand hygiene, social distance, and mask wearing.  If positive, patient should remain in quarantine for 10 days from symptom onset AND greater than 72 hours after symptoms resolution (absence of fever without the use of fever-reducing medication and improvement in respiratory symptoms), whichever is longer Get plenty of rest and push fluids Use OTC zyrtec for nasal congestion, runny nose, and/or sore throat Use OTC flonase for nasal congestion and runny nose Use medications daily for symptom relief Use OTC medications like ibuprofen or tylenol as needed fever or pain Call or go to the ED if you have any new or worsening symptoms such as fever, worsening cough, shortness of breath, chest tightness, chest pain, turning blue, changes in mental status.  Reviewed expectations re: course of current medical issues. Questions answered. Outlined signs and symptoms indicating need for more acute intervention. Patient verbalized understanding. After Visit Summary given.         Faustino Congress, NP 11/23/20 1849

## 2020-11-23 NOTE — ED Triage Notes (Signed)
Body aches, headache and high fever.

## 2020-11-23 NOTE — Discharge Instructions (Addendum)
I have sent in a prednisone taper for you to take for 6 days. 6 tablets on day one, 5 tablets on day two, 4 tablets on day three, 3 tablets on day four, 2 tablets on day five, and 1 tablet on day six.  I have sent in cough syrup for you to take. This medication can make you sleepy. Do not drive while taking this medication.  Your COVID and Flu tests are pending.  You should self quarantine until the test results are back.    Take Tylenol or ibuprofen as needed for fever or discomfort.  Rest and keep yourself hydrated.    Follow-up with your primary care provider if your symptoms are not improving.     

## 2020-11-26 LAB — COVID-19, FLU A+B NAA
Influenza A, NAA: NOT DETECTED
Influenza B, NAA: NOT DETECTED
SARS-CoV-2, NAA: DETECTED — AB

## 2020-11-27 ENCOUNTER — Encounter (INDEPENDENT_AMBULATORY_CARE_PROVIDER_SITE_OTHER): Payer: Self-pay | Admitting: Family Medicine

## 2020-11-27 ENCOUNTER — Telehealth (INDEPENDENT_AMBULATORY_CARE_PROVIDER_SITE_OTHER): Payer: 59 | Admitting: Family Medicine

## 2020-11-27 DIAGNOSIS — F17200 Nicotine dependence, unspecified, uncomplicated: Secondary | ICD-10-CM | POA: Diagnosis not present

## 2020-11-27 DIAGNOSIS — E8881 Metabolic syndrome: Secondary | ICD-10-CM

## 2020-11-27 DIAGNOSIS — Z6841 Body Mass Index (BMI) 40.0 and over, adult: Secondary | ICD-10-CM

## 2020-11-29 NOTE — Progress Notes (Signed)
TeleHealth Visit:  Due to the COVID-19 pandemic, this visit was completed with telemedicine (audio/video) technology to reduce patient and provider exposure as well as to preserve personal protective equipment.   [name] has verbally consented to this TeleHealth visit. The patient is located at home, the provider is located at the [facility] office. The participants in this visit include the listed provider and patient. The visit was conducted today via telephone.  [name] was unable to use realtime audiovisual technology today and the telehealth visit was conducted via telephone (31 minute call).  Chief Complaint: OBESITY [name] is here to discuss his progress with his obesity treatment plan along with follow-up of his obesity related diagnoses. [name] is on the Category 4 Plan and states he is following his eating plan approximately 70% of the time. [name] states he is exercising 0 minutes 0 times per week.  Today's visit was #: 7 Starting weight: 396 lbs Starting date: 08/02/2020  Interim History: [name] has a consultation with a bariatric surgeon on December 13, 2020. He dips snuff and is working on quitting. He has lost 3 lbs since his last OV. He is currently positive for COVID-19 but feeling fairly well. [name] has one 12oz can of Dr. Reino Gardner daily (down from 60-80 oz daily). He is overeating extra calories.  Subjective:   1. SMOKELESS TOBACCO ABUSE [name] dips snuff. He is seeing a Teacher, English as a foreign language on Jan 19th. We discussed the negative effects of using nicotine after bariatric surgery.  2. Insulin resistance [name] has a diagnosis of insulin resistance based on his elevated fasting insulin level >5. He denies polyphagia. He is on Metformin. His last A1c was 5.5 on 08/02/2020.  Lab Results  Component Value Date   INSULIN 15.3 08/02/2020   Lab Results  Component Value Date   HGBA1C 5.5 08/02/2020    Assessment/Plan:   1. SMOKELESS TOBACCO  ABUSE I advised patient to work on cutting out dipping snuff and made him aware that he must be tobacco free for surgery.  2. Insulin resistance  Continue Metformin as directed.   3. Class 3 severe obesity with serious comorbidity and body mass index (BMI) of 50.0 to 59.9 in adult, unspecified obesity type [name]) [name] is currently in the action stage of change. As such, his goal is to continue with weight loss efforts. He has agreed to the Category 4 Plan + 200 calories.   1. [name] will work on quitting dipping snuff. 2. Decrease Dr. Reino Gardner to 8 oz daily. 3. Keep track of extra calories (<600) using MyFitnessPal. MyChart message sent about M[facility].  Exercise goals: All adults should avoid inactivity. Some physical activity is better than none, and adults who participate in any amount of physical activity gain some health benefits.  Behavioral modification strategies: increasing vegetables and decreasing liquid calories.  [name] has agreed to follow-up with our clinic in 3 weeks.  Objective:   VITALS: Per patient if applicable, see vitals. GENERAL: Alert and in no acute distress. CARDIOPULMONARY: No increased WOB. Speaking in clear sentences.  PSYCH: Pleasant and cooperative. Speech normal rate and rhythm. Affect is appropriate. Insight and judgement are appropriate. Attention is focused, linear, and appropriate.  NEURO: Oriented as arrived to appointment on time with no prompting.   Lab Results  Component Value Date   CREATININE 0.99 08/02/2020   BUN 16 08/02/2020   NA 138 08/02/2020   K 4.3 08/02/2020   CL 101 08/02/2020   CO2 25 08/02/2020   Lab  Results  Component Value Date   ALT 19 08/02/2020   AST 13 08/02/2020   ALKPHOS 60 08/02/2020   BILITOT 0.4 08/02/2020   Lab Results  Component Value Date   HGBA1C 5.5 08/02/2020   HGBA1C 5.1 07/07/2018   HGBA1C 5.2 08/19/2013   HGBA1C 5.6 05/03/2012   HGBA1C  03/01/2010    5.9 (NOTE) The ADA recommends the following  therapeutic goal for glycemic control related to Hgb A1c measurement: Goal of therapy: <6.5 Hgb A1c  Reference: American Diabetes Association: Clinical Practice Recommendations 2010, Diabetes Care, 2010, 33: (Suppl  1).   Lab Results  Component Value Date   INSULIN 15.3 08/02/2020   Lab Results  Component Value Date   TSH 2.300 08/02/2020   Lab Results  Component Value Date   CHOL 148 08/02/2020   HDL 48 08/02/2020   LDLCALC 83 08/02/2020   TRIG 88 08/02/2020   CHOLHDL 3.9 07/07/2018   Lab Results  Component Value Date   WBC 11.5 (H) 03/22/2020   HGB 15.1 03/22/2020   HCT 45.8 03/22/2020   MCV 85.8 03/22/2020   PLT 300 03/22/2020   Lab Results  Component Value Date   IRON 63 03/01/2020   TIBC 331 03/01/2020   FERRITIN 82 03/01/2020    Attestation Statements:   Reviewed by clinician on day of visit: allergies, medications, problem list, medical history, surgical history, family history, social history, and previous encounter notes.  Time spent on visit including pre-visit chart review and post-visit charting and care was 31 minutes.   Coral Ceo, am acting as Location manager for [employer], [place].  I have reviewed the above documentation for accuracy and completeness, and I agree with the above. - Georgianne Fick, FNP

## 2020-12-04 ENCOUNTER — Other Ambulatory Visit: Payer: Self-pay | Admitting: Family Medicine

## 2020-12-04 ENCOUNTER — Other Ambulatory Visit (INDEPENDENT_AMBULATORY_CARE_PROVIDER_SITE_OTHER): Payer: Self-pay | Admitting: Family Medicine

## 2020-12-04 DIAGNOSIS — E8881 Metabolic syndrome: Secondary | ICD-10-CM

## 2020-12-04 DIAGNOSIS — M5126 Other intervertebral disc displacement, lumbar region: Secondary | ICD-10-CM

## 2020-12-04 NOTE — Telephone Encounter (Signed)
Last OV with Dawn 

## 2020-12-13 ENCOUNTER — Other Ambulatory Visit (HOSPITAL_COMMUNITY): Payer: Self-pay | Admitting: Surgery

## 2020-12-13 ENCOUNTER — Other Ambulatory Visit: Payer: Self-pay | Admitting: Surgery

## 2020-12-17 ENCOUNTER — Other Ambulatory Visit: Payer: Self-pay | Admitting: Physician Assistant

## 2020-12-19 ENCOUNTER — Ambulatory Visit (INDEPENDENT_AMBULATORY_CARE_PROVIDER_SITE_OTHER): Payer: 59 | Admitting: Family Medicine

## 2020-12-19 ENCOUNTER — Other Ambulatory Visit: Payer: Self-pay

## 2020-12-19 ENCOUNTER — Encounter (INDEPENDENT_AMBULATORY_CARE_PROVIDER_SITE_OTHER): Payer: Self-pay | Admitting: Family Medicine

## 2020-12-19 VITALS — BP 125/78 | HR 96 | Temp 97.6°F | Ht 68.0 in | Wt 383.0 lb

## 2020-12-19 DIAGNOSIS — F17200 Nicotine dependence, unspecified, uncomplicated: Secondary | ICD-10-CM

## 2020-12-19 DIAGNOSIS — Z6841 Body Mass Index (BMI) 40.0 and over, adult: Secondary | ICD-10-CM

## 2020-12-21 NOTE — Progress Notes (Signed)
Chief Complaint:   OBESITY [name] is here to discuss his progress with his obesity treatment plan along with follow-up of his obesity related diagnoses. [name] is on the Category 4 Plan + 200 calories and states he is following his eating plan approximately 60% of the time. [name] states he is doing 0 minutes 0 times per week.  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 He is preparing for bariatric surgery. He has not seen the registered dietician or the psychologist yet for approval. He saw Dr. Kae Heller (bariatric surgeon), and she recommended RNY gastric bypass.   Subjective:   1. SMOKELESS TOBACCO ABUSE Akeen dips snuff and is working on quitting. He says he does not crave nicotine. He is unsure why he dips. He feels he can quit cold [place].  Assessment/Plan:   1. SMOKELESS TOBACCO ABUSE We discussed setting a quit date within the next 2 weeks.  2. Class 3 severe obesity with serious comorbidity and body mass index (BMI) of 50.0 to 59.9 in adult, unspecified obesity type [facility]) Elray is currently in the action stage of change. As such, his goal is to continue with weight loss efforts. He has agreed to the Category 4 Plan + 200 calories.   Kory will work on always packing food for work. Keep extra calories at <600, and cut back to one 8 oz can of Dr. Malachi Bonds daily.  Deveron will have labs done at [facility] soon for surgery.  Exercise goals: All adults should avoid inactivity. Some physical activity is better than none, and adults who participate in any amount of physical activity  gain some health benefits.  Behavioral modification strategies: decreasing simple carbohydrates and decreasing liquid calories.  Deovion has agreed to follow-up with our clinic in 3 weeks.  Objective:   Blood pressure 125/78, pulse 96, temperature 97.6 F (36.4 C), height 5\' 8"  (1.727 m), weight (!) 383 lb (173.7 kg), SpO2 100 %. Body mass index is 58.23 kg/m.  General: Cooperative, alert, well developed, in no acute distress. HEENT: Conjunctivae and lids unremarkable. Cardiovascular: Regular rhythm.  Lungs: Normal work of breathing. Neurologic: No focal deficits.   Lab Results  Component Value Date   CREATININE 0.99 08/02/2020   BUN 16 08/02/2020   NA 138 08/02/2020   K 4.3 08/02/2020   CL 101 08/02/2020   CO2 25 08/02/2020   Lab Results  Component Value Date   ALT 19 08/02/2020   AST 13 08/02/2020   ALKPHOS 60 08/02/2020   BILITOT 0.4 08/02/2020   Lab Results  Component Value Date   HGBA1C 5.5 08/02/2020   HGBA1C 5.1 07/07/2018   HGBA1C 5.2 08/19/2013   HGBA1C 5.6 05/03/2012   HGBA1C  03/01/2010    5.9 (NOTE) The ADA recommends the following therapeutic goal for glycemic control related to Hgb A1c measurement: Goal of therapy: <6.5 Hgb A1c  Reference: American Diabetes Association: Clinical Practice Recommendations 2010, Diabetes Care, 2010, 33: (Suppl  1).   Lab Results  Component Value Date   INSULIN 15.3 08/02/2020   Lab Results  Component Value Date  TSH 2.300 08/02/2020   Lab Results  Component Value Date   CHOL 148 08/02/2020   HDL 48 08/02/2020   LDLCALC 83 08/02/2020   TRIG 88 08/02/2020   CHOLHDL 3.9 07/07/2018   Lab Results  Component Value Date   WBC 11.5 (H) 03/22/2020   HGB 15.1 03/22/2020   HCT 45.8 03/22/2020   MCV 85.8 03/22/2020   PLT 300 03/22/2020   Lab Results  Component Value Date   IRON 63 03/01/2020   TIBC 331 03/01/2020   FERRITIN 82 03/01/2020   Attestation Statements:   Reviewed by clinician on day of visit:  allergies, medications, problem list, medical history, surgical history, family history, social history, and previous encounter notes.   Wilhemena Durie, am acting as Location manager for [employer], FNP-C.  I have reviewed the above documentation for accuracy and completeness, and I agree with the above. -  Georgianne Fick, FNP

## 2020-12-22 ENCOUNTER — Ambulatory Visit (HOSPITAL_COMMUNITY): Payer: 59

## 2020-12-22 ENCOUNTER — Ambulatory Visit (HOSPITAL_COMMUNITY)
Admission: RE | Admit: 2020-12-22 | Discharge: 2020-12-22 | Disposition: A | Discharge status: Home / Self Care | Payer: 59 | Source: Ambulatory Visit | Attending: Surgery | Admitting: Surgery

## 2020-12-25 ENCOUNTER — Encounter (INDEPENDENT_AMBULATORY_CARE_PROVIDER_SITE_OTHER): Payer: Self-pay | Admitting: Family Medicine

## 2020-12-29 ENCOUNTER — Ambulatory Visit (HOSPITAL_COMMUNITY)
Admission: RE | Admit: 2020-12-29 | Discharge: 2020-12-29 | Disposition: A | Discharge status: Home / Self Care | Payer: 59 | Source: Ambulatory Visit | Attending: Surgery | Admitting: Surgery

## 2020-12-29 ENCOUNTER — Other Ambulatory Visit: Payer: Self-pay

## 2021-01-05 ENCOUNTER — Other Ambulatory Visit: Payer: Self-pay | Admitting: Family Medicine

## 2021-01-05 ENCOUNTER — Other Ambulatory Visit (INDEPENDENT_AMBULATORY_CARE_PROVIDER_SITE_OTHER): Payer: Self-pay | Admitting: Family Medicine

## 2021-01-05 DIAGNOSIS — E88819 Insulin resistance, unspecified: Secondary | ICD-10-CM

## 2021-01-05 DIAGNOSIS — K219 Gastro-esophageal reflux disease without esophagitis: Secondary | ICD-10-CM

## 2021-01-05 DIAGNOSIS — E8881 Metabolic syndrome: Secondary | ICD-10-CM

## 2021-01-08 NOTE — Telephone Encounter (Signed)
Last OV with Dawn 

## 2021-01-08 NOTE — Telephone Encounter (Signed)
Refill request

## 2021-01-09 ENCOUNTER — Encounter: Payer: Self-pay | Admitting: Skilled Nursing Facility1

## 2021-01-09 ENCOUNTER — Encounter: Payer: 59 | Attending: Surgery | Admitting: Skilled Nursing Facility1

## 2021-01-09 ENCOUNTER — Other Ambulatory Visit (INDEPENDENT_AMBULATORY_CARE_PROVIDER_SITE_OTHER): Payer: Self-pay | Admitting: Family Medicine

## 2021-01-09 ENCOUNTER — Other Ambulatory Visit: Payer: Self-pay

## 2021-01-09 DIAGNOSIS — E8881 Metabolic syndrome: Secondary | ICD-10-CM

## 2021-01-09 DIAGNOSIS — E669 Obesity, unspecified: Secondary | ICD-10-CM | POA: Insufficient documentation

## 2021-01-09 NOTE — Progress Notes (Signed)
Nutrition Assessment for Bariatric Surgery Medical Nutrition Therapy  Patient was seen on 01/09/2021 for Pre-Operative Nutrition Assessment. Letter of approval faxed to [facility] bariatric surgery program coordinator on 01/09/2021.   Referral stated Supervised Weight Loss (SWL) visits needed: 0  Pt seems to be properly prepared for surgery already making changes with his habits and understands he needs to avoid energy drinks and regular Gatorade   Planned surgery: RYGB Pt expectation of surgery: cure GERD Pt expectation of dietitian: to help educate    NUTRITION ASSESSMENT   Anthropometrics  Start weight at [facility]: 398 lbs (date: 01/09/2021)  Height: 68 in BMI: 60.52 kg/m2     Clinical  Medical hx: schizophrenia  Medications: see list Labs:  Notable signs/symptoms: reflux Any previous deficiencies? Vitamin D  Micronutrient Nutrition Focused Physical Exam: Hair: No issues observed Eyes: No issues observed Mouth: No issues observed Neck: No issues observed Nails: No issues observed Skin: No issues observed  Lifestyle & Dietary Hx  Pt states he was able to quit snuff 2 weeks ago stating it has been tough stating he has turned to food. Pt states he plans to get a motorcycle for stress management.  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   24-Hr Dietary Recall First Meal: 3 eggs Snack: single serve nuts and broccoli and cauliflower + ranch Second Meal: tuna + flatbread + pineapple  Snack: broccoli + cauliflower + 1 packet peanut butter crackers + 1 brownie Third Meal: pork or chicken or steak + mac n cheese or mashed potatoes + green beans Snack:  Beverages: water, 1 can dr pepper, energy drink   Estimated Energy Needs Calories: 1600   NUTRITION DIAGNOSIS  Overweight/obesity ([place]-3.3) related to past poor dietary habits and physical inactivity as evidenced by patient w/ planned RYGB surgery following dietary guidelines for continued weight  loss.    NUTRITION INTERVENTION  Nutrition counseling (C-1) and education (E-2) to facilitate bariatric surgery goals.   Pre-Op Goals Reviewed with the Patient . Track food and beverage intake (pen and paper, MyFitness Pal, Baritastic app, etc.) . Make healthy food choices while monitoring portion sizes . Consume 3 meals per day or try to eat every 3-5 hours . Avoid concentrated sugars and fried foods . Keep sugar & fat in the single digits per serving on food labels . Practice CHEWING your food (aim for applesauce consistency) . Practice not drinking 15 minutes before, during, and 30 minutes after each meal and snack . Avoid all carbonated beverages (ex: soda, sparkling beverages)  . Limit caffeinated beverages (ex: coffee, tea, energy drinks) . Avoid all sugar-sweetened beverages (ex: regular soda, sports drinks)  . Avoid alcohol  . Aim for 64-100 ounces of FLUID daily (with at least half of fluid intake being plain water)  . Aim for at least 60-80 grams of PROTEIN daily . Look for a liquid protein source that contains ?15 g protein and ?5 g carbohydrate (ex: shakes, drinks, shots) . Make a list of non-food related activities . Physical activity is an important part of a healthy lifestyle so keep it moving! The goal is to reach 150 minutes of exercise per week, including cardiovascular and weight baring activity.  *Goals that are bolded indicate the pt would like to start working towards these  Handouts Provided Include  . Bariatric Surgery handouts (Nutrition Visits, Pre-Op Goals, Protein Shakes, Vitamins & Minerals)  Learning Style & Readiness for Change Teaching method utilized: Visual & Auditory  Demonstrated degree of understanding  via: Teach Back  Readiness Level: Action Barriers to learning/adherence to lifestyle change: stress response      MONITORING & EVALUATION Dietary intake, weekly physical activity, body weight, and pre-op goals reached at next nutrition visit.     Next Steps  Patient is to follow up at [facility] for Pre-Op Class >2 weeks before surgery for further nutrition education.

## 2021-01-09 NOTE — Telephone Encounter (Signed)
Last seen by Dawn  

## 2021-01-10 ENCOUNTER — Ambulatory Visit (INDEPENDENT_AMBULATORY_CARE_PROVIDER_SITE_OTHER): Payer: 59 | Admitting: Family Medicine

## 2021-01-12 ENCOUNTER — Other Ambulatory Visit: Payer: Self-pay

## 2021-01-12 ENCOUNTER — Telehealth: Payer: Self-pay | Admitting: *Deleted

## 2021-01-12 ENCOUNTER — Ambulatory Visit (INDEPENDENT_AMBULATORY_CARE_PROVIDER_SITE_OTHER): Payer: 59 | Admitting: Family Medicine

## 2021-01-12 VITALS — BP 110/75 | HR 84 | Temp 97.5°F | Ht 68.0 in | Wt 395.0 lb

## 2021-01-12 DIAGNOSIS — M19019 Primary osteoarthritis, unspecified shoulder: Secondary | ICD-10-CM

## 2021-01-12 DIAGNOSIS — E039 Hypothyroidism, unspecified: Secondary | ICD-10-CM | POA: Diagnosis not present

## 2021-01-12 DIAGNOSIS — F902 Attention-deficit hyperactivity disorder, combined type: Secondary | ICD-10-CM

## 2021-01-12 MED ORDER — AMPHETAMINE-DEXTROAMPHET ER 20 MG PO CP24: 20.0000 mg | ORAL_CAPSULE | Freq: Every day | ORAL | 0 refills | Status: DC

## 2021-01-12 MED ORDER — LIDOCAINE 5 % EX PTCH
1.0000 | MEDICATED_PATCH | CUTANEOUS | 0 refills | Status: DC
Start: 2021-01-12 — End: 2021-06-05

## 2021-01-12 NOTE — Progress Notes (Signed)
Subjective: CC: ADHD PCP: [name], DO GYI:RSWNIOEV [name] is a 35 y.o. male presenting to clinic today for:  1.  ADHD Patient was last seen in November 2021 for ADHD.  He has been compliant with Adderall XR 20 mg daily.  Unfortunately when he went to the pharmacy recently to get his last prescription filled they said that they did not have a third prescription on file for him.  He is here early for this reason to try and get renewal on his medication.  He denies any chest pain, insomnia but does report mild constipation with the medication.  Is scheduled for weight loss surgery soon.    2.  Right shoulder pain Patient reports that he sustained a mechanical fall a while back and has been having anterior right shoulder pain since.  He notes that actions like raising his right upper extremity aggravates the pain.  Sometimes when he lies on that side it hurts.  He is not able to take NSAIDs secondary to chronic stomach issues.  He has used some over-the-counter meds with little improvement in symptoms.  He has been on prednisone for another issue but not find that especially helpful.  He is active with his upper extremities and is right-hand dominant.  No reports of sensory changes in the upper extremity  3.  Hypothyroidism Patient is compliant with his medications.  He reports mild constipation as above but no other adverse symptoms.  ROS: Per HPI  Allergies  Allergen Reactions  . Ibuprofen Other (See Comments)    Makes ulcers bleed  . Influenza Vaccines Shortness Of Breath and Other (See Comments)    Rash and unable to breathe well  . Tylenol [Acetaminophen] Other (See Comments)    Bleeding ulcers  . Bee Venom Swelling    SWELLING REACTION UNSPECIFIED   . Tramadol Itching   Past Medical History:  Diagnosis Date  . ADHD (attention deficit hyperactivity disorder)   . Anal fissure   . Anxiety   . Asthma   . Back pain   . Bipolar disorder ([place])   . Chest pain   .  Chronic lower back pain   . Constipation   . Depression   . Esophagitis    Distal esophageal erosions consistent with mild erosive  reflux esophagitis 12/2008 by EGD   . External hemorrhoids   . Gastric polyps   . Gastric ulcer   . GERD (gastroesophageal reflux disease)   . Hepatic steatosis   . Hiatal hernia   . History of stomach ulcers   . Hx MRSA infection 8/08   right thigh  . Hx of colonic polyps   . Hypercholesteremia    denies  . Hypothyroidism   . Internal hemorrhoids   . Irritable bowel syndrome (IBS)   . Obesity   . Occult GI bleeding 12/2008   Trivial upper GI bleed/uncontrolled GERD by upper endoscopy 12/2008, normal f/u endoscopy 03/2009 with SBCE at that time  . OSA on CPAP   . Pain management   . Pneumonia 09/01/2015   "on ATB still" (09/04/2015)  . S/P colonoscopy 11/10, 10/08   Dr [name]  . Schizophrenia [facility])   . Sleep apnea   . Stomach ulcer   . Thyroid function test abnormal    Noted in 2011 discharge  . Tobacco dipper   . Wears contact lenses     Current Outpatient Medications:  .  amphetamine-dextroamphetamine (ADDERALL XR) 20 MG 24 hr capsule, Take 1 capsule (20  mg total) by mouth daily., Disp: 30 capsule, Rfl: 0 .  amphetamine-dextroamphetamine (ADDERALL XR) 20 MG 24 hr capsule, Take 1 capsule (20 mg total) by mouth daily., Disp: 30 capsule, Rfl: 0 .  amphetamine-dextroamphetamine (ADDERALL XR) 20 MG 24 hr capsule, Take 1 capsule (20 mg total) by mouth daily., Disp: 30 capsule, Rfl: 0 .  cyclobenzaprine (FLEXERIL) 10 MG tablet, TAKE 1 TABLET BY MOUTH THREE TIMES DAILY AS NEEDED FOR MUSCLE SPASMS, Disp: 90 tablet, Rfl: 2 .  dicyclomine (BENTYL) 20 MG tablet, TAKE 1 TABLET BY MOUTH 4 TIMES DAILY BEFORE MEAL(S) AND AT BEDTIME, Disp: 120 tablet, Rfl: 1 .  docusate sodium (COLACE) 250 MG capsule, Take 250 mg by mouth 2 (two) times daily., Disp: , Rfl:  .  EPINEPHrine 0.3 mg/0.3 mL IJ SOAJ injection, Inject 0.3 mg into the muscle as needed for  anaphylaxis., Disp: , Rfl:  .  EUTHYROX 75 MCG tablet, TAKE 1 TABLET BY MOUTH ONCE DAILY BEFORE BREAKFAST, Disp: 90 tablet, Rfl: 1 .  gabapentin (NEURONTIN) 300 MG capsule, TAKE 1 CAPSULE BY MOUTH THREE TIMES DAILY, Disp: 90 capsule, Rfl: 6 .  lamoTRIgine (LAMICTAL) 25 MG tablet, Take 2 tablets by mouth twice daily, Disp: 120 tablet, Rfl: 2 .  metFORMIN (GLUCOPHAGE) 500 MG tablet, Take 1 tablet by mouth once daily with breakfast, Disp: 30 tablet, Rfl: 0 .  pantoprazole (PROTONIX) 40 MG tablet, Take 1 tablet by mouth twice daily, Disp: 180 tablet, Rfl: 0 .  promethazine-dextromethorphan (PROMETHAZINE-DM) 6.25-15 MG/5ML syrup, Take 5 mLs by mouth 4 (four) times daily as needed for cough., Disp: 118 mL, Rfl: 0 .  Vitamin D, Ergocalciferol, (DRISDOL) 1.25 MG (50000 UNIT) CAPS capsule, TAKE 1 CAPSULE BY MOUTH ONCE A WEEK EVERY  7  DAYS, Disp: 12 capsule, Rfl: 0 Social History   Socioeconomic History  . Marital status: Married    Spouse name: Lawerance Bach  . Number of children: 2  . Years of education: Not on file  . Highest education level: Not on file  Occupational History  . Occupation: Systems analyst: [employer]  . Occupation: truck Geophysicist/field seismologist  Tobacco Use  . Smoking status: Never Smoker  . Smokeless tobacco: Current User    Types: Snuff  . Tobacco comment: quitting snuff. whinning off   Vaping Use  . Vaping Use: Never used  Substance and Sexual Activity  . Alcohol use: Yes    Alcohol/week: 0.0 standard drinks    Comment: rare  . Drug use: No  . Sexual activity: Not Currently  Other Topics Concern  . Not on file  Social History Narrative  . Not on file   Social Determinants of Health   Financial Resource Strain: Not on file  Food Insecurity: Not on file  Transportation Needs: Not on file  Physical Activity: Not on file  Stress: Not on file  Social Connections: Not on file  Intimate Partner Violence: Not on file   Family History  Problem Relation Age of Onset  .  Leukemia Father 35  . Colon polyps Father   . Clotting disorder Father   . Heart disease Father   . Cancer Father   . Depression Father   . Sleep apnea Father   . Obesity Father   . Seizures Mother   . Irritable bowel syndrome Mother   . Thyroid disease Mother   . Depression Mother   . Anxiety disorder Mother   . Bipolar disorder Brother   . ADD / ADHD Brother   .  Diabetes Paternal Grandmother   . Ulcerative colitis Paternal Aunt   . Colon cancer Neg Hx   . Liver cancer Neg Hx   . Esophageal cancer Neg Hx   . Stomach cancer Neg Hx   . Pancreatic cancer Neg Hx     Objective: Office vital signs reviewed. BP 110/75   Pulse 84   Temp (!) 97.5 F (36.4 C) (Temporal)   Ht 5\' 8"  (1.727 m)   Wt (!) 395 lb (179.2 kg)   SpO2 95%   BMI 60.06 kg/m   Physical Examination:  General: Awake, alert, morbidly obese, No acute distress HEENT: Normal; no exophthalmos.  No goiter Cardio: regular rate and rhythm, S1S2 heard, no murmurs appreciated Pulm: clear to auscultation bilaterally, no wheezes, rhonchi or rales; normal work of breathing on room air Extremities: warm, well perfused, No edema, cyanosis or clubbing; +2 pulses bilaterally MSK:   Right shoulder: No appreciable deformities, soft tissue swelling or erythema.  He is exquisitely tender to palpation over the [facility] joint.  He has limited active range of motion in abduction and shows a painful arc sign.  External rotation of the shoulder also painful.  Negative empty can.  Mild pain noted with Hawkins test. Skin: dry; intact; no rashes or lesions Neuro: Sensation grossly intact  Assessment/ Plan: 35 y.o. male   Attention deficit hyperactivity disorder (ADHD), combined type - Plan: amphetamine-dextroamphetamine (ADDERALL XR) 20 MG 24 hr capsule, amphetamine-dextroamphetamine (ADDERALL XR) 20 MG 24 hr capsule, amphetamine-dextroamphetamine (ADDERALL XR) 20 MG 24 hr capsule  AC joint arthropathy - Plan: Ambulatory referral to Sports  Medicine, lidocaine (LIDODERM) 5 %  Acquired hypothyroidism - Plan: Thyroid Panel With TSH  ADHD is stable.  Medications have been renewed.  The [place] narcotic database was reviewed and there were no red flags  I suspect this is an [facility] joint arthropathy.  May benefit from direct visualization under ultrasound plus or minus corticosteroid injection directed to that area.  Referral to sports medicine center has been placed.  I have prescribed him lidocaine patches to apply to the affected area in efforts to alleviate pain.  He is minimally symptomatic from a thyroid standpoint, constipation which may be drug-induced.  Check thyroid panel along with other labs required by his surgeon  No orders of the defined types were placed in this encounter.  No orders of the defined types were placed in this encounter.    [name], DO [place] [phone]

## 2021-01-12 NOTE — Telephone Encounter (Signed)
Lidocaine 5% patches  Office Note sent to plan     Key: [id number] Sent to plan

## 2021-01-12 NOTE — Patient Instructions (Signed)
Apply ice to that area.  I've sent a lidocaine patch in efforts to help the pain

## 2021-01-13 LAB — THYROID PANEL WITH TSH
Free Thyroxine Index: 1.7 (ref 1.2–4.9)
T3 Uptake Ratio: 28 % (ref 24–39)
T4, Total: 5.9 ug/dL (ref 4.5–12.0)
TSH: 2.11 u[IU]/mL (ref 0.450–4.500)

## 2021-01-15 NOTE — Telephone Encounter (Signed)
Please inform patient.  Ok to substitute Salonpas OTC

## 2021-01-15 NOTE — Telephone Encounter (Signed)
Denied today This request has not been approved. Based on the information submitted for review, you did not meet our guideline rules for the requested drug. In order for your request to be approved, your provider would need to show that you have met the guideline rules below. The details below are written in medical language. If you have questions, please contact your provider. In some cases, the requested medication or alternatives offered may have additional approval requirements. Our guideline named LIDOCAINE PATCH 5% (Lidoderm) requires the following rule be met for approval:A. You have ONE of the following diagnoses:1. Postherpetic neuralgia [nerve pain caused by shingles virus (herpes zoster)]2. 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 We do not have information showing that you have one of the diagnoses listed above. This is why your request is denied. Please work with your doctor to use a different medication or get [place] more information if it will allow [place] to approve this request. A written notification letter will follow with additional details.

## 2021-01-15 NOTE — Telephone Encounter (Signed)
Patient notified and verbalized understanding. 

## 2021-01-18 ENCOUNTER — Ambulatory Visit: Payer: 59 | Admitting: Sports Medicine

## 2021-01-18 ENCOUNTER — Encounter: Payer: Self-pay | Admitting: Sports Medicine

## 2021-01-18 ENCOUNTER — Ambulatory Visit
Admission: RE | Admit: 2021-01-18 | Discharge: 2021-01-18 | Disposition: A | Discharge status: Home / Self Care | Payer: 59 | Source: Ambulatory Visit | Attending: Sports Medicine | Admitting: Sports Medicine

## 2021-01-18 ENCOUNTER — Other Ambulatory Visit: Payer: Self-pay

## 2021-01-18 VITALS — BP 134/77 | Ht 69.0 in | Wt 370.0 lb

## 2021-01-18 DIAGNOSIS — M25511 Pain in right shoulder: Secondary | ICD-10-CM | POA: Diagnosis not present

## 2021-01-18 DIAGNOSIS — G8929 Other chronic pain: Secondary | ICD-10-CM

## 2021-01-18 NOTE — Progress Notes (Addendum)
   Subjective:    Patient ID: [name], male    DOB: 11-06-1986, 35 y.o.   MRN: [id number]  HPI chief complaint: Right shoulder pain  Very pleasant 35 year old right-hand-dominant male comes in today at the request of his PCP for evaluation of right shoulder pain.  Patient fell on the right shoulder about 6 weeks ago.  He had immediate pain and bruising which eventually improved but never completely resolved.  His pain began to worsen again about 2-1/2 weeks ago.  He localizes it primarily over the acromioclavicular joint.  No pain elsewhere in the shoulder.  Pain does not radiate.  It is most noticeable with shoulder abduction.  He also has pain at night.  He is unable to take NSAIDs due to GI upset so he has been using topical lidocaine which has been somewhat helpful.  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  He denies any significant injury to the shoulder in the past.  No prior shoulder surgeries.  No numbness or tingling.  He has not had imaging of the right shoulder.  Past medical history reviewed Medications reviewed Allergies reviewed    Review of Systems As above    Objective:   Physical Exam  Well-developed, well-nourished.  No acute distress.  Right shoulder: Patient has limited active abduction secondary to pain.  Good internal and external rotation.  Some pain with forward flexion.  He is tender to palpation directly over the distal clavicle and at the acromioclavicular joint.  No real pain with crossover adduction testing.  No soft tissue swelling.  No ecchymosis.  Rotator cuff strength is 5/5.  Negative O'Brien's, negative speeds, negative Yergason's.  Neurovascularly intact distally.      Assessment & Plan:   Right shoulder pain secondary to [facility] joint sprain versus distal clavicle fracture  I am going to start with getting an x-ray of the right shoulder specifically to rule out a distal clavicle fracture.  If  unremarkable, we will schedule the patient to come back to the office for an ultrasound-guided injection into his acromioclavicular joint.  Phone follow-up with x-ray results when available.  Patient is in agreement with that plan.  Addendum: X-rays reviewed.  No obvious fracture appreciated at the distal clavicle.  We will schedule the patient for an ultrasound-guided acromioclavicular joint injection.

## 2021-01-19 ENCOUNTER — Other Ambulatory Visit (INDEPENDENT_AMBULATORY_CARE_PROVIDER_SITE_OTHER): Payer: Self-pay | Admitting: Family Medicine

## 2021-01-19 DIAGNOSIS — E559 Vitamin D deficiency, unspecified: Secondary | ICD-10-CM

## 2021-01-22 ENCOUNTER — Ambulatory Visit (INDEPENDENT_AMBULATORY_CARE_PROVIDER_SITE_OTHER): Payer: 59 | Admitting: Psychology

## 2021-01-22 DIAGNOSIS — F509 Eating disorder, unspecified: Secondary | ICD-10-CM

## 2021-01-22 NOTE — Telephone Encounter (Signed)
Last seen by Dawn Whitmire, FNP 

## 2021-01-26 ENCOUNTER — Ambulatory Visit (HOSPITAL_COMMUNITY): Payer: 59

## 2021-01-30 ENCOUNTER — Other Ambulatory Visit: Payer: Self-pay

## 2021-01-30 ENCOUNTER — Ambulatory Visit (INDEPENDENT_AMBULATORY_CARE_PROVIDER_SITE_OTHER): Payer: 59 | Admitting: Sports Medicine

## 2021-01-30 DIAGNOSIS — M19019 Primary osteoarthritis, unspecified shoulder: Secondary | ICD-10-CM | POA: Insufficient documentation

## 2021-01-30 DIAGNOSIS — M19011 Primary osteoarthritis, right shoulder: Secondary | ICD-10-CM | POA: Diagnosis not present

## 2021-01-30 MED ORDER — METHYLPREDNISOLONE ACETATE 40 MG/ML IJ SUSP
40.0000 mg | Freq: Once | INTRAMUSCULAR | Status: AC
  Administered 2021-01-30: 40 mg via INTRA_ARTICULAR

## 2021-01-30 NOTE — Patient Instructions (Signed)
It was great to meet you today! Thank you for letting me participate in your care!  Today, we discussed your right shoulder pain due to [facility] Joint arthritis. If the injection works and you get complete relief but your pain returns please return and we can discuss other options for treatment or consider repeating an injection. If it does not work please let us know as well. Follow up as needed.  Be well, Harolyn Rutherford, DO PGY-4, Sports Medicine Fellow [place]

## 2021-01-30 NOTE — Assessment & Plan Note (Signed)
Patient with [facility] Joint arthritis presenting for injection today. Injection procedure noted below. - F/u if pain returns to discuss further options such as repeat injection vs referral to ortho for possible surgery

## 2021-01-30 NOTE — Progress Notes (Signed)
    SUBJECTIVE:   CHIEF COMPLAINT / HPI:   Right Shoulder Pain [name] returns for follow up for right shoulder pain. He was last seen in our clinic on 2/24 by Dr. Micheline Chapman and diagnosed with [facility] arthritis. He returns today for a steroid injection into the right shoulder. He continues to have pain with overhead movements and shoulder abduction above 90 degrees.  PERTINENT  PMH / PSH: OSA, GERD, Bipolar disorder, Morbid Obesity  OBJECTIVE:   BP 130/90   Ht 5\' 9"  (1.753 m)   Wt (!) 370 lb (167.8 kg)   BMI 54.64 kg/m   No flowsheet data found.  Shoulder, Right: TTP at the right [facility] joint, no obvious deformity, no erythema, no ecchymosis. Active ROM of right shoulder within normal limits in all planes but painful with flexion and abduction above 90 degrees.  ASSESSMENT/PLAN:   Acromioclavicular joint arthritis Patient with [facility] Joint arthritis presenting for injection today. Injection procedure noted below. - F/u if pain returns to discuss further options such as repeat injection vs referral to ortho for possible surgery   Limited MSK U/S: Right Shoulder The right AC joint was visualized under ultrasound guidance. Positive for mushroom sign. Verbal consent was obtained after discussing the risks and benefits of the procedure with the patient. A timeout was performed and the correct site and side was identified and confirmed.  The right shoulder was cleaned in sterile fashion betadine and alcohol pad. 1cc of 40 mg Depo-medrol and 1 cc 1% Lidocaine was injected using a out of plane superior approach using a 5 cc syringe and 25 gauge 5/8th needle under ultrasound guidance. No complications were encountered. Minimal blood loss. A band aid was applied.    Phillip Alpha, DO PGY-4, Sports Medicine Fellow [place]  Patient seen and evaluated with the sports medicine fellow.  I agree with the above plan of care.  Recent x-rays of this patient's right shoulder showed AC  joint arthropathy but no obvious fracture.  Injection as above.  If symptoms persist, consider merits of further diagnostic imaging.  Follow-up for ongoing or recalcitrant issues.

## 2021-02-06 ENCOUNTER — Ambulatory Visit: Payer: 59 | Admitting: Psychology

## 2021-02-08 ENCOUNTER — Other Ambulatory Visit: Payer: Self-pay

## 2021-02-08 ENCOUNTER — Other Ambulatory Visit: Payer: 59

## 2021-02-09 ENCOUNTER — Other Ambulatory Visit: Payer: Self-pay | Admitting: Gastroenterology

## 2021-02-09 ENCOUNTER — Other Ambulatory Visit: Payer: Self-pay | Admitting: Family Medicine

## 2021-02-09 DIAGNOSIS — F3177 Bipolar disorder, in partial remission, most recent episode mixed: Secondary | ICD-10-CM

## 2021-02-23 ENCOUNTER — Ambulatory Visit (INDEPENDENT_AMBULATORY_CARE_PROVIDER_SITE_OTHER): Payer: 59 | Admitting: Family Medicine

## 2021-02-23 ENCOUNTER — Encounter: Payer: Self-pay | Admitting: Family Medicine

## 2021-02-23 DIAGNOSIS — A084 Viral intestinal infection, unspecified: Secondary | ICD-10-CM | POA: Diagnosis not present

## 2021-02-23 NOTE — Progress Notes (Signed)
Virtual Visit via Telephone Note  I connected with [name] on 02/23/21 at 9:52 AM by telephone and verified that I am speaking with the correct person using two identifiers. [name] is currently located at home and nobody is currently with him during this visit. The provider, Loman Brooklyn, FNP is located in their office at time of visit.  I discussed the limitations, risks, security and privacy concerns of performing an evaluation and management service by telephone and the availability of in person appointments. I also discussed with the patient that there may be a patient responsible charge related to this service. The patient expressed understanding and agreed to proceed.  Subjective: PCP: [name], DO  Chief Complaint  Patient presents with  . Emesis   Patient reports he started vomiting last night.  He vomited 6-7 times.  This morning when he woke up he reports he feels like he has been beat with a 2 x 4.  He has been taking Zofran this morning.   ROS: Per HPI  Current Outpatient Medications:  .  [START ON 03/12/2021] amphetamine-dextroamphetamine (ADDERALL XR) 20 MG 24 hr capsule, Take 1 capsule (20 mg total) by mouth daily., Disp: 30 capsule, Rfl: 0 .  amphetamine-dextroamphetamine (ADDERALL XR) 20 MG 24 hr capsule, Take 1 capsule (20 mg total) by mouth daily., Disp: 30 capsule, Rfl: 0 .  amphetamine-dextroamphetamine (ADDERALL XR) 20 MG 24 hr capsule, Take 1 capsule (20 mg total) by mouth daily., Disp: 30 capsule, Rfl: 0 .  cyclobenzaprine (FLEXERIL) 10 MG tablet, TAKE 1 TABLET BY MOUTH THREE TIMES DAILY AS NEEDED FOR MUSCLE SPASMS, Disp: 90 tablet, Rfl: 2 .  dicyclomine (BENTYL) 20 MG tablet, TAKE 1 TABLET BY MOUTH 4 TIMES DAILY BEFORE MEAL(S) AND AT BEDTIME, Disp: 120 tablet, Rfl: 0 .  docusate sodium (COLACE) 250 MG capsule, Take 250 mg by mouth 2 (two) times daily., Disp: , Rfl:  .  EPINEPHrine 0.3 mg/0.3 mL IJ SOAJ injection, Inject 0.3 mg  into the muscle as needed for anaphylaxis., Disp: , Rfl:  .  EUTHYROX 75 MCG tablet, TAKE 1 TABLET BY MOUTH ONCE DAILY BEFORE BREAKFAST, Disp: 90 tablet, Rfl: 1 .  gabapentin (NEURONTIN) 300 MG capsule, TAKE 1 CAPSULE BY MOUTH THREE TIMES DAILY, Disp: 90 capsule, Rfl: 6 .  lamoTRIgine (LAMICTAL) 25 MG tablet, Take 2 tablets by mouth twice daily, Disp: 120 tablet, Rfl: 0 .  lidocaine (LIDODERM) 5 %, Place 1 patch onto the skin daily. Remove & Discard patch within 12 hours or as directed by MD, Disp: 30 patch, Rfl: 0 .  pantoprazole (PROTONIX) 40 MG tablet, Take 1 tablet by mouth twice daily, Disp: 180 tablet, Rfl: 0 .  Vitamin D, Ergocalciferol, (DRISDOL) 1.25 MG (50000 UNIT) CAPS capsule, TAKE 1 CAPSULE BY MOUTH ONCE A WEEK EVERY  7  DAYS, Disp: 12 capsule, Rfl: 0  Allergies  Allergen Reactions  . Ibuprofen Other (See Comments)    Makes ulcers bleed  . Influenza Vaccines Shortness Of Breath and Other (See Comments)    Rash and unable to breathe well  . Tylenol [Acetaminophen] Other (See Comments)    Bleeding ulcers  . Bee Venom Swelling    SWELLING REACTION UNSPECIFIED   . Tramadol Itching   Past Medical History:  Diagnosis Date  . ADHD (attention deficit hyperactivity disorder)   . Anal fissure   . Anxiety   . Asthma   . Back pain   . Bipolar disorder ([place])   .  Chest pain   . Chronic lower back pain   . Constipation   . Depression   . Esophagitis    Distal esophageal erosions consistent with mild erosive  reflux esophagitis 12/2008 by EGD   . External hemorrhoids   . Gastric polyps   . Gastric ulcer   . GERD (gastroesophageal reflux disease)   . Hepatic steatosis   . Hiatal hernia   . History of stomach ulcers   . Hx MRSA infection 8/08   right thigh  . Hx of colonic polyps   . Hypercholesteremia    denies  . Hypothyroidism   . Internal hemorrhoids   . Irritable bowel syndrome (IBS)   . Obesity   . Occult GI bleeding 12/2008   Trivial upper GI bleed/uncontrolled GERD  by upper endoscopy 12/2008, normal f/u endoscopy 03/2009 with SBCE at that time  . OSA on CPAP   . Pain management   . Pneumonia 09/01/2015   "on ATB still" (09/04/2015)  . S/P colonoscopy 11/10, 10/08   Dr [name]  . Schizophrenia [facility])   . Sleep apnea   . Stomach ulcer   . Thyroid function test abnormal    Noted in 2011 discharge  . Tobacco dipper   . Wears contact lenses     Observations/Objective: A&O  No respiratory distress or wheezing audible over the phone Mood, judgement, and thought processes all WNL   Assessment and Plan: 1. Viral gastroenteritis Continue Zofran as needed.  Discussed importance of adequate hydration.   Follow Up Instructions:  I discussed the assessment and treatment plan with the patient. The patient was provided an opportunity to ask questions and all were answered. The patient agreed with the plan and demonstrated an understanding of the instructions.   The patient was advised to call back or seek an in-person evaluation if the symptoms worsen or if the condition fails to improve as anticipated.  The above assessment and management plan was discussed with the patient. The patient verbalized understanding of and has agreed to the management plan. Patient is aware to call the clinic if symptoms persist or worsen. Patient is aware when to return to the clinic for a follow-up visit. Patient educated on when it is appropriate to go to the emergency department.   Time call ended: 9:57 AM  I provided 5 minutes of non-face-to-face time during this encounter.  Hendricks Limes, MSN, APRN, FNP-C [facility] 02/23/21

## 2021-02-26 ENCOUNTER — Encounter: Payer: 59 | Attending: Surgery | Admitting: Skilled Nursing Facility1

## 2021-02-26 ENCOUNTER — Other Ambulatory Visit: Payer: Self-pay

## 2021-02-26 DIAGNOSIS — Z6841 Body Mass Index (BMI) 40.0 and over, adult: Secondary | ICD-10-CM | POA: Diagnosis present

## 2021-02-26 NOTE — Progress Notes (Signed)
Pre-Operative Nutrition Class:  Appt start time: 5188   End time:  1830.  Patient was seen on 02/26/2021 for Pre-Operative Bariatric Surgery Education at the [facility].    Surgery date:  Surgery type: RYGB Start weight at [facility]: 398 Weight today: 408.6  Samples given per MNT protocol. Patient educated on appropriate usage: Procare Multivitamin Lot # [id number] Exp: 05/22   procare Vitamins Calcium  Lot # [id number] Exp: 08/22   Protein 20 Powder/Shake Lot # [id number] Exp: 03/18/03/06/23  The following the learning objectives were met by the patient during this course:  Identify Pre-Op Dietary Goals and will begin 2 weeks pre-operatively  Identify appropriate sources of fluids and proteins   State protein recommendations and appropriate sources pre and post-operatively  Identify Post-Operative Dietary Goals and will follow for 2 weeks post-operatively  Identify appropriate multivitamin and calcium sources  Describe the need for physical activity post-operatively and will follow MD recommendations  State when to call healthcare provider regarding medication questions or post-operative complications  Handouts given during class include:  Pre-Op Bariatric Surgery Diet Handout  Protein Shake Handout  Post-Op Bariatric Surgery Nutrition Handout  BELT Program Information Flyer  Support Group Information Flyer  [facility] Bariatric Supplements Price List  Follow-Up Plan: Patient will follow-up at [facility] 2 weeks post operatively for diet advancement per MD.

## 2021-03-05 ENCOUNTER — Encounter: Payer: Self-pay | Admitting: Primary Care

## 2021-03-05 ENCOUNTER — Ambulatory Visit (INDEPENDENT_AMBULATORY_CARE_PROVIDER_SITE_OTHER): Payer: 59 | Admitting: Primary Care

## 2021-03-05 ENCOUNTER — Other Ambulatory Visit: Payer: Self-pay

## 2021-03-05 VITALS — BP 128/76 | HR 78 | Temp 97.1°F | Ht 70.0 in | Wt >= 6400 oz

## 2021-03-05 DIAGNOSIS — G4733 Obstructive sleep apnea (adult) (pediatric): Secondary | ICD-10-CM

## 2021-03-05 NOTE — Patient Instructions (Signed)
Orders: CPAP supplies, (headgear, philips dream wear small nasal pillows, tubing, filters)  Follow-up: 6 months televisit Beth NP or Dr. Annamaria Boots    CPAP and BPAP Information CPAP and BPAP are methods that use air pressure to keep your airways open and to help you breathe well. CPAP and BPAP use different amounts of pressure. Your health care provider will tell you whether CPAP or BPAP would be more helpful for you.  CPAP stands for "continuous positive airway pressure." With CPAP, the amount of pressure stays the same while you breathe in and out.  BPAP stands for "bi-level positive airway pressure." With BPAP, the amount of pressure will be higher when you breathe in (inhale) and lower when you breathe out(exhale). This allows you to take larger breaths. CPAP or BPAP may be used in the hospital, or your health care provider may want you to use it at home. You may need to have a sleep study before your health care provider can order a machine for you to use at home. Why are CPAP and BPAP treatments used? CPAP or BPAP can be helpful if you have:  Sleep apnea.  Chronic obstructive pulmonary disease (COPD).  Heart failure.  Medical conditions that cause muscle weakness, including muscular dystrophy or amyotrophic lateral sclerosis (ALS).  Other problems that cause breathing to be shallow, weak, abnormal, or difficult. CPAP and BPAP are most commonly used for obstructive sleep apnea (OSA) to keep the airways from collapsing when the muscles relax during sleep. How is CPAP or BPAP administered? Both CPAP and BPAP are provided by a small machine with a flexible plastic tube that attaches to a plastic mask that you wear. Air is blown through the mask into your nose or mouth. The amount of pressure that is used to blow the air can be adjusted on the machine. Your health care provider will set the pressure setting and help you find the best mask for you. When should CPAP or BPAP be used? In most  cases, the mask only needs to be worn during sleep. Generally, the mask needs to be worn throughout the night and during any daytime naps. People with certain medical conditions may also need to wear the mask at other times when they are awake. Follow instructions from your health care provider about when to use the machine. What are some tips for using the mask?  Because the mask needs to be snug, some people feel trapped or closed-in (claustrophobic) when first using the mask. If you feel this way, you may need to get used to the mask. One way to do this is to hold the mask loosely over your nose or mouth and then gradually apply the mask more snugly. You can also gradually increase the amount of time that you use the mask.  Masks are available in various types and sizes. If your mask does not fit well, talk with your health care provider about getting a different one. Some common types of masks include: ? Full face masks, which fit over the mouth and nose. ? Nasal masks, which fit over the nose. ? Nasal pillow or prong masks, which fit into the nostrils.  If you are using a mask that fits over your nose and you tend to breathe through your mouth, a chin strap may be applied to help keep your mouth closed.  Some CPAP and BPAP machines have alarms that may sound if the mask comes off or develops a leak.  If you have trouble  with the mask, it is very important that you talk with your health care provider about finding a way to make the mask easier to tolerate. Do not stop using the mask. There could be a negative impact to your health if you stop using the mask.   What are some tips for using the machine?  Place your CPAP or BPAP machine on a secure table or stand near an electrical outlet.  Know where the on/off switch is on the machine.  Follow instructions from your health care provider about how to set the pressure on your machine and when you should use it.  Do not eat or drink while the  CPAP or BPAP machine is on. Food or fluids could get pushed into your lungs by the pressure of the CPAP or BPAP.  For home use, CPAP and BPAP machines can be rented or purchased through home health care companies. Many different brands of machines are available. Renting a machine before purchasing may help you find out which particular machine works well for you. Your insurance may also decide which machine you may get.  Keep the CPAP or BPAP machine and attachments clean. Ask your health care provider for specific instructions. Follow these instructions at home:  Do not use any products that contain nicotine or tobacco, such as cigarettes, e-cigarettes, and chewing tobacco. If you need help quitting, ask your health care provider.  Keep all follow-up visits as told by your health care provider. This is important. Contact a health care provider if:  You have redness or pressure sores on your head, face, mouth, or nose from the mask or head gear.  You have trouble using the CPAP or BPAP machine.  You cannot tolerate wearing the CPAP or BPAP mask.  Someone tells you that you snore even when wearing your CPAP or BPAP. Get help right away if:  You have trouble breathing.  You feel confused. Summary  CPAP and BPAP are methods that use air pressure to keep your airways open and to help you breathe well.  You may need to have a sleep study before your health care provider can order a machine for home use.  If you have trouble with the mask, it is very important that you talk with your health care provider about finding a way to make the mask easier to tolerate. Do not stop using the mask. There could be a negative impact to your health if you stop using the mask.  Follow instructions from your health care provider about when to use the machine. This information is not intended to replace advice given to you by your health care provider. Make sure you discuss any questions you have with your  health care provider. Document Revised: 12/03/2019 Document Reviewed: 12/06/2019 Elsevier Patient Education  2021 [employer].

## 2021-03-05 NOTE — Progress Notes (Signed)
'@Patient'  ID: [name], male    DOB: [birth year], 35 y.o.   MRN: [id number]  Chief Complaint  Patient presents with  . Follow-up    Can't get CPAP supplies     Referring provider: [name], DO  HPI: 35 yo male, never smoker. PMH significant for syncope, OSA, Seasonal allergies, and GERD. Patient is seen by Dr. Annamaria Boots.   Previous [facility] pulmonary encounter: 03/03/2020 Patient presents today for annual visit follow up. Patients needs to be seen today to get new CPAP equipment from his DME company. He is doing well and has no complaints. Patient wears nasal mask CPAP and is tolerating his current settings well. He is compliant and denies any leakages. He denies any daytime drowsiness, hypertension, chest pain, palpitations, sob, wheezing, swelling, headaches. Epworth score of 2   He does have some complaints of mild seasonal allergies which he sometimes takes OTC Allegra and Flonase for. His symptoms are itchy eyes, post nasal drip, and nasal congestion. Patient is willing to take these medications daily if this will help with his symptoms. GERD is currently controlled on Protonix 40 mg. Denies heartburn, indigestion, sob, cough.   Compliance Report: Uses 87% of the days; 73% greater than 4 hours; min P 5 cmH2O; max P 15 cmH2O; Pressure 95th percentile is 11.9 cmH2O; Leaks 95th percentile is 31.8 cmH2O; AHI 2.2  03/05/2021 Patient presents today for annual follow-up OSA. He last worse CPAP over a year ago. Reports that he never received supplies. He has his own machine. He uses a small [employer] dreamwear nasal pillow mask. DME [employer]. Pressure 5-15cm h20. He is planning to have gastric bypass, waiting on insurance approval. He quit smokeless tobacco.  Sleep studies:  - NPSG 08/03/12-  AHI 24.1/ hr, moderate OSA.   - Split night sleep study 03/25/18 Mild obstructive sleep apnea occurred during this study (AHI = 19.1/h).  Allergies  Allergen Reactions  . Ibuprofen Other (See  Comments)    Makes ulcers bleed  . Influenza Vaccines Shortness Of Breath and Other (See Comments)    Rash and unable to breathe well  . Tylenol [Acetaminophen] Other (See Comments)    Bleeding ulcers  . Bee Venom Swelling    SWELLING REACTION UNSPECIFIED   . Tramadol Itching    Immunization History  Administered Date(s) Administered  . DTaP 02/14/1986, 04/25/1986, 08/15/1986, 09/05/1987, 02/09/1991  . Hepatitis B 09/29/1998, 02/07/1999  . HiB (PRP-OMP) 01/23/1988  . IPV 02/14/1986, 04/25/1986, 09/05/1987, 02/09/1991  . Influenza Split 08/25/2010, 08/20/2012, 08/25/2014  . MMR 02/28/1987, 02/09/1991    Past Medical History:  Diagnosis Date  . ADHD (attention deficit hyperactivity disorder)   . Anal fissure   . Anxiety   . Asthma   . Back pain   . Bipolar disorder ([place])   . Chest pain   . Chronic lower back pain   . Constipation   . Depression   . Esophagitis    Distal esophageal erosions consistent with mild erosive  reflux esophagitis 12/2008 by EGD   . External hemorrhoids   . Gastric polyps   . Gastric ulcer   . GERD (gastroesophageal reflux disease)   . Hepatic steatosis   . Hiatal hernia   . History of stomach ulcers   . Hx MRSA infection 8/08   right thigh  . Hx of colonic polyps   . Hypercholesteremia    denies  . Hypothyroidism   . Internal hemorrhoids   . Irritable bowel syndrome (IBS)   .  Obesity   . Occult GI bleeding 12/2008   Trivial upper GI bleed/uncontrolled GERD by upper endoscopy 12/2008, normal f/u endoscopy 03/2009 with SBCE at that time  . OSA on CPAP   . Pain management   . Pneumonia 09/01/2015   "on ATB still" (09/04/2015)  . S/P colonoscopy 11/10, 10/08   Dr [name]  . Schizophrenia [facility])   . Sleep apnea   . Stomach ulcer   . Thyroid function test abnormal    Noted in 2011 discharge  . Tobacco dipper   . Wears contact lenses     Tobacco History: Social History   Tobacco Use  Smoking Status Never Smoker  Smokeless  Tobacco Current User  . Types: Snuff  Tobacco Comment   quitting snuff. whinning off    Ready to quit: Not Answered Counseling given: Not Answered Comment: quitting snuff. whinning off    Outpatient Medications Prior to Visit  Medication Sig Dispense Refill  . [START ON 03/12/2021] amphetamine-dextroamphetamine (ADDERALL XR) 20 MG 24 hr capsule Take 1 capsule (20 mg total) by mouth daily. 30 capsule 0  . amphetamine-dextroamphetamine (ADDERALL XR) 20 MG 24 hr capsule Take 1 capsule (20 mg total) by mouth daily. 30 capsule 0  . amphetamine-dextroamphetamine (ADDERALL XR) 20 MG 24 hr capsule Take 1 capsule (20 mg total) by mouth daily. 30 capsule 0  . cyclobenzaprine (FLEXERIL) 10 MG tablet TAKE 1 TABLET BY MOUTH THREE TIMES DAILY AS NEEDED FOR MUSCLE SPASMS 90 tablet 2  . dicyclomine (BENTYL) 20 MG tablet TAKE 1 TABLET BY MOUTH 4 TIMES DAILY BEFORE MEAL(S) AND AT BEDTIME 120 tablet 0  . docusate sodium (COLACE) 250 MG capsule Take 250 mg by mouth 2 (two) times daily.    [name] Kitchen EPINEPHrine 0.3 mg/0.3 mL IJ SOAJ injection Inject 0.3 mg into the muscle as needed for anaphylaxis.    Arna Medici 75 MCG tablet TAKE 1 TABLET BY MOUTH ONCE DAILY BEFORE BREAKFAST 90 tablet 1  . gabapentin (NEURONTIN) 300 MG capsule TAKE 1 CAPSULE BY MOUTH THREE TIMES DAILY 90 capsule 6  . lamoTRIgine (LAMICTAL) 25 MG tablet Take 2 tablets by mouth twice daily 120 tablet 0  . lidocaine (LIDODERM) 5 % Place 1 patch onto the skin daily. Remove & Discard patch within 12 hours or as directed by MD 30 patch 0  . pantoprazole (PROTONIX) 40 MG tablet Take 1 tablet by mouth twice daily 180 tablet 0  . Vitamin D, Ergocalciferol, (DRISDOL) 1.25 MG (50000 UNIT) CAPS capsule TAKE 1 CAPSULE BY MOUTH ONCE A WEEK EVERY  7  DAYS 12 capsule 0   No facility-administered medications prior to visit.    Review of Systems  Review of Systems  Constitutional: Negative.   HENT: Negative.   Respiratory: Negative.   Cardiovascular: Negative.     Physical Exam  BP 128/76 (BP Location: Left Arm, Cuff Size: Large)   Pulse 78   Temp (!) 97.1 F (36.2 C) (Temporal)   Ht '5\' 10"'  (1.778 m)   Wt (!) 407 lb 3.2 oz (184.7 kg)   SpO2 98%   BMI 58.43 kg/m  Physical Exam Constitutional:      Appearance: He is obese.  HENT:     Head: Normocephalic and atraumatic.  Cardiovascular:     Rate and Rhythm: Normal rate and regular rhythm.     Comments: RRR Pulmonary:     Effort: Pulmonary effort is normal.     Breath sounds: Normal breath sounds.  Skin:    General:  Skin is warm and dry.  Neurological:     General: No focal deficit present.     Mental Status: He is alert and oriented to person, place, and time. Mental status is at baseline.  Psychiatric:        Mood and Affect: Mood normal.        Behavior: Behavior normal.        Thought Content: Thought content normal.        Judgment: Judgment normal.      Lab Results:  CBC    Component Value Date/Time   WBC 11.5 (H) 03/22/2020 1532   RBC 5.34 03/22/2020 1532   HGB 15.1 03/22/2020 1532   HGB 15.3 03/01/2020 0840   HCT 45.8 03/22/2020 1532   HCT 46.1 03/01/2020 0840   PLT 300 03/22/2020 1532   PLT 293 03/01/2020 0840   MCV 85.8 03/22/2020 1532   MCV 86 03/01/2020 0840   MCH 28.3 03/22/2020 1532   MCHC 33.0 03/22/2020 1532   RDW 14.3 03/22/2020 1532   RDW 14.3 03/01/2020 0840   LYMPHSABS 3.2 03/22/2020 1532   LYMPHSABS 2.4 03/01/2020 0840   MONOABS 0.7 03/22/2020 1532   EOSABS 0.1 03/22/2020 1532   EOSABS 0.1 03/01/2020 0840   BASOSABS 0.0 03/22/2020 1532   BASOSABS 0.1 03/01/2020 0840    BMET    Component Value Date/Time   NA 138 08/02/2020 1018   K 4.3 08/02/2020 1018   CL 101 08/02/2020 1018   CO2 25 08/02/2020 1018   GLUCOSE 97 08/02/2020 1018   GLUCOSE 87 03/22/2020 1532   BUN 16 08/02/2020 1018   CREATININE 0.99 08/02/2020 1018   CALCIUM 9.3 08/02/2020 1018   GFRNONAA 99 08/02/2020 1018   GFRAA 114 08/02/2020 1018    BNP No results found  for: BNP  ProBNP No results found for: PROBNP  Imaging: No results found.   Assessment & Plan:   Obstructive sleep apnea - Split night sleep study 03/25/18 showed mild OSA, AHI 19/hr.  - He has not used CPAP in the last year d/t not having CPAP supplies. - We will send in a new order for CPAP supplies ([employer]ips dreamwear small nasal pillows). He may need HST as there was a break in therapy - Patient is considering gastric bypass for weight loss  - FU in 6 months   Martyn Ehrich, NP 03/05/2021

## 2021-03-05 NOTE — Assessment & Plan Note (Addendum)
-   Split night sleep study 03/25/18 showed mild OSA, AHI 19/hr.  - He has not used CPAP in the last year d/t not having CPAP supplies. - We will send in a new order for CPAP supplies (philips dreamwear small nasal pillows). He may need HST as there was a break in therapy - Patient is considering gastric bypass for weight loss  - FU in 6 months

## 2021-03-16 ENCOUNTER — Other Ambulatory Visit: Payer: Self-pay | Admitting: Family Medicine

## 2021-03-16 DIAGNOSIS — M5126 Other intervertebral disc displacement, lumbar region: Secondary | ICD-10-CM

## 2021-03-16 DIAGNOSIS — F3177 Bipolar disorder, in partial remission, most recent episode mixed: Secondary | ICD-10-CM

## 2021-03-23 ENCOUNTER — Ambulatory Visit: Payer: 59 | Admitting: Family Medicine

## 2021-03-27 ENCOUNTER — Other Ambulatory Visit: Payer: Self-pay | Admitting: Family Medicine

## 2021-03-27 DIAGNOSIS — M5126 Other intervertebral disc displacement, lumbar region: Secondary | ICD-10-CM

## 2021-04-02 ENCOUNTER — Other Ambulatory Visit: Payer: Self-pay | Admitting: Gastroenterology

## 2021-04-03 ENCOUNTER — Other Ambulatory Visit: Payer: Self-pay

## 2021-04-03 ENCOUNTER — Ambulatory Visit (INDEPENDENT_AMBULATORY_CARE_PROVIDER_SITE_OTHER): Payer: 59 | Admitting: Family Medicine

## 2021-04-03 ENCOUNTER — Encounter: Payer: Self-pay | Admitting: Family Medicine

## 2021-04-03 DIAGNOSIS — F902 Attention-deficit hyperactivity disorder, combined type: Secondary | ICD-10-CM

## 2021-04-03 MED ORDER — ADDERALL XR 20 MG PO CP24: 20.0000 mg | ORAL_CAPSULE | Freq: Every day | ORAL | 0 refills | Status: DC

## 2021-04-03 NOTE — Progress Notes (Signed)
Subjective: CC: ADHD follow-up PCP: [name], DO HGD:[id number] B Pham is a 35 y.o. male presenting to clinic today for:  1.  ADHD follow-up Patient reports good control of ADHD with Adderall 20 mg extended release daily.  His insurance apparently requires brand name for coverage at this time.  He does report some cottonmouth and chronic constipation but no exacerbation of either.  He hydrates well with water.  Denies any insomnia, tremor, increased anxiety.  Mood has been stable with Lamictal 50 mg daily   ROS: Per HPI  Allergies  Allergen Reactions  . Ibuprofen Other (See Comments)    Makes ulcers bleed  . Influenza Vaccines Shortness Of Breath and Other (See Comments)    Rash and unable to breathe well  . Tylenol [Acetaminophen] Other (See Comments)    Bleeding ulcers  . Bee Venom Swelling    SWELLING REACTION UNSPECIFIED   . Tramadol Itching   Past Medical History:  Diagnosis Date  . ADHD (attention deficit hyperactivity disorder)   . Anal fissure   . Anxiety   . Asthma   . Back pain   . Bipolar disorder ([place])   . Chest pain   . Chronic lower back pain   . Constipation   . Depression   . Esophagitis    Distal esophageal erosions consistent with mild erosive  reflux esophagitis 12/2008 by EGD   . External hemorrhoids   . Gastric polyps   . Gastric ulcer   . GERD (gastroesophageal reflux disease)   . Hepatic steatosis   . Hiatal hernia   . History of stomach ulcers   . Hx MRSA infection 8/08   right thigh  . Hx of colonic polyps   . Hypercholesteremia    denies  . Hypothyroidism   . Internal hemorrhoids   . Irritable bowel syndrome (IBS)   . Obesity   . Occult GI bleeding 12/2008   Trivial upper GI bleed/uncontrolled GERD by upper endoscopy 12/2008, normal f/u endoscopy 03/2009 with SBCE at that time  . OSA on CPAP   . Pain management   . Pneumonia 09/01/2015   "on ATB still" (09/04/2015)  . S/P colonoscopy 11/10, 10/08   Dr [name]  .  Schizophrenia [facility])   . Sleep apnea   . Stomach ulcer   . Thyroid function test abnormal    Noted in 2011 discharge  . Tobacco dipper   . Wears contact lenses     Current Outpatient Medications:  .  amphetamine-dextroamphetamine (ADDERALL XR) 20 MG 24 hr capsule, Take 1 capsule (20 mg total) by mouth daily., Disp: 30 capsule, Rfl: 0 .  cyclobenzaprine (FLEXERIL) 10 MG tablet, Take 1 tablet by mouth three times daily as needed for muscle spasm, Disp: 90 tablet, Rfl: 0 .  dicyclomine (BENTYL) 20 MG tablet, TAKE 1 TABLET BY MOUTH 4 TIMES DAILY BEFORE MEAL(S) AND AT BEDTIME, Disp: 120 tablet, Rfl: 0 .  docusate sodium (COLACE) 250 MG capsule, Take 250 mg by mouth 2 (two) times daily., Disp: , Rfl:  .  EPINEPHrine 0.3 mg/0.3 mL IJ SOAJ injection, Inject 0.3 mg into the muscle as needed for anaphylaxis., Disp: , Rfl:  .  EUTHYROX 75 MCG tablet, TAKE 1 TABLET BY MOUTH ONCE DAILY BEFORE BREAKFAST, Disp: 90 tablet, Rfl: 1 .  gabapentin (NEURONTIN) 300 MG capsule, TAKE 1 CAPSULE BY MOUTH THREE TIMES DAILY, Disp: 90 capsule, Rfl: 0 .  lamoTRIgine (LAMICTAL) 25 MG tablet, Take 2 tablets by mouth twice daily, Disp:  120 tablet, Rfl: 0 .  lidocaine (LIDODERM) 5 %, Place 1 patch onto the skin daily. Remove & Discard patch within 12 hours or as directed by MD, Disp: 30 patch, Rfl: 0 .  pantoprazole (PROTONIX) 40 MG tablet, Take 1 tablet by mouth twice daily, Disp: 180 tablet, Rfl: 0 .  Vitamin D, Ergocalciferol, (DRISDOL) 1.25 MG (50000 UNIT) CAPS capsule, TAKE 1 CAPSULE BY MOUTH ONCE A WEEK EVERY  7  DAYS, Disp: 12 capsule, Rfl: 0 .  amphetamine-dextroamphetamine (ADDERALL XR) 20 MG 24 hr capsule, Take 1 capsule (20 mg total) by mouth daily. (Patient not taking: Reported on 04/03/2021), Disp: 30 capsule, Rfl: 0 .  amphetamine-dextroamphetamine (ADDERALL XR) 20 MG 24 hr capsule, Take 1 capsule (20 mg total) by mouth daily. (Patient not taking: Reported on 04/03/2021), Disp: 30 capsule, Rfl: 0 Social History    Socioeconomic History  . Marital status: Married    Spouse name: Lawerance Bach  . Number of children: 2  . Years of education: Not on file  . Highest education level: Not on file  Occupational History  . Occupation: Systems analyst: [employer]  . Occupation: truck Geophysicist/field seismologist  Tobacco Use  . Smoking status: Never Smoker  . Smokeless tobacco: Current User    Types: Snuff  . Tobacco comment: quitting snuff. whinning off   Vaping Use  . Vaping Use: Never used  Substance and Sexual Activity  . Alcohol use: Yes    Alcohol/week: 0.0 standard drinks    Comment: rare  . Drug use: No  . Sexual activity: Not Currently  Other Topics Concern  . Not on file  Social History Narrative  . Not on file   Social Determinants of Health   Financial Resource Strain: Not on file  Food Insecurity: Not on file  Transportation Needs: Not on file  Physical Activity: Not on file  Stress: Not on file  Social Connections: Not on file  Intimate Partner Violence: Not on file   Family History  Problem Relation Age of Onset  . Leukemia Father 33  . Colon polyps Father   . Clotting disorder Father   . Heart disease Father   . Cancer Father   . Depression Father   . Sleep apnea Father   . Obesity Father   . Seizures Mother   . Irritable bowel syndrome Mother   . Thyroid disease Mother   . Depression Mother   . Anxiety disorder Mother   . Bipolar disorder Brother   . ADD / ADHD Brother   . Diabetes Paternal Grandmother   . Ulcerative colitis Paternal Aunt   . Colon cancer Neg Hx   . Liver cancer Neg Hx   . Esophageal cancer Neg Hx   . Stomach cancer Neg Hx   . Pancreatic cancer Neg Hx     Objective: Office vital signs reviewed. BP 137/84   Pulse 95   Temp 98 F (36.7 C)   Ht 5\' 10"  (1.778 m)   Wt (!) 414 lb (187.8 kg)   SpO2 97%   BMI 59.40 kg/m   Physical Examination:  General: Awake, alert, morbidly obese, No acute distress Cardio: regular rate and rhythm, S1S2  heard, no murmurs appreciated Pulm: clear to auscultation bilaterally, no wheezes, rhonchi or rales; normal work of breathing on room air  Assessment/ Plan: 35 y.o. male   Attention deficit hyperactivity disorder (ADHD), combined type - Plan: ADDERALL XR 20 MG 24 hr capsule, ADDERALL XR 20 MG 24  hr capsule, ADDERALL XR 20 MG 24 hr capsule  ADHD is stable with Adderall XR 20 mg daily.  Has some cottonmouth but otherwise seems to be asymptomatic.  National narcotic database was reviewed and there were no red flags.  He is up-to-date on UDS and CSC.  He may follow-up in 3 months, sooner if needed  No orders of the defined types were placed in this encounter.  No orders of the defined types were placed in this encounter.    [name], DO [place] [phone]

## 2021-04-09 ENCOUNTER — Other Ambulatory Visit: Payer: Self-pay | Admitting: Family Medicine

## 2021-04-09 ENCOUNTER — Other Ambulatory Visit: Payer: Self-pay | Admitting: Gastroenterology

## 2021-04-09 ENCOUNTER — Other Ambulatory Visit: Payer: Self-pay | Admitting: Nurse Practitioner

## 2021-04-09 DIAGNOSIS — K219 Gastro-esophageal reflux disease without esophagitis: Secondary | ICD-10-CM

## 2021-04-09 DIAGNOSIS — F3177 Bipolar disorder, in partial remission, most recent episode mixed: Secondary | ICD-10-CM

## 2021-04-09 DIAGNOSIS — M5126 Other intervertebral disc displacement, lumbar region: Secondary | ICD-10-CM

## 2021-04-16 ENCOUNTER — Other Ambulatory Visit: Payer: Self-pay | Admitting: Family Medicine

## 2021-04-30 ENCOUNTER — Other Ambulatory Visit: Payer: Self-pay

## 2021-04-30 ENCOUNTER — Other Ambulatory Visit: Payer: Self-pay | Admitting: Family Medicine

## 2021-04-30 ENCOUNTER — Ambulatory Visit (INDEPENDENT_AMBULATORY_CARE_PROVIDER_SITE_OTHER): Payer: 59 | Admitting: Sports Medicine

## 2021-04-30 VITALS — BP 141/95 | Ht 70.0 in | Wt 385.0 lb

## 2021-04-30 DIAGNOSIS — M19011 Primary osteoarthritis, right shoulder: Secondary | ICD-10-CM

## 2021-04-30 DIAGNOSIS — M5126 Other intervertebral disc displacement, lumbar region: Secondary | ICD-10-CM

## 2021-04-30 MED ORDER — METHYLPREDNISOLONE ACETATE 40 MG/ML IJ SUSP
40.0000 mg | Freq: Once | INTRAMUSCULAR | Status: AC
  Administered 2021-04-30: 40 mg via INTRA_ARTICULAR

## 2021-04-30 NOTE — Progress Notes (Signed)
   Subjective:    Patient ID: [name], male    DOB: 1985-12-13, 35 y.o.   MRN: [id number]  HPI chief complaint: Right shoulder pain  Kadyn presents today requesting a repeat cortisone injection into his right AC joint.  He was last seen in the office in March after an x-ray showed some mild arthritis there.  Cortisone injection administered at that time provided almost immediate relief.  He was doing well up until about a week ago.  Pain is now returned.  It is identical in nature to what he experienced previously.  No new trauma.  He is having difficulty sleeping at night due to the pain.    Review of Systems As above    Objective:   Physical Exam  Well-developed, well-nourished.  BMI of 55  Right shoulder: Range of motion is limited secondary to pain at the acromioclavicular joint.  Tenderness to palpation directly over the acromioclavicular joint.  Neurovascularly intact distally.      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  He tolerated this without difficulty.  This was done after risks and benefits including the risk of hypopigmentation were explained.  If he continues to have pain despite repeat injection and referral to orthopedics will be considered for discussion of possible DCE.  We will likely need to proceed with a preoperative MRI before that referral.  Follow-up as needed.

## 2021-05-01 ENCOUNTER — Ambulatory Visit: Payer: Self-pay | Admitting: Surgery

## 2021-05-01 NOTE — H&P (Signed)
Surgical Evaluation  Chief Complaint: GERD  HPI: returns for further consultation regarding surgical management of refractory GERD and morbid obesity.  He has completed the bariatric pathway with no barriers identified. He started the preoperative diet a couple days ago.  He has had some weight regain since our last visit, in part due to the recent unexpected loss of his mother At the age of 36 from acute liver failure.  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  He feels that he is handling these losses appropriately at this point  is still eager to proceed with gastric bypass.[name] Kitchen  UGI/CXR: negative. No HH, no reflux noted Dietician: approved 01/09/21 Phillip Glassing [name]) Psych: approved 01/22/21 [name]) LabsL nicotine and cotinine negative 2 (March, May). Remaining labs unremarkable  Initial visit 12/13/20: Very pleasant 35 year old gentleman with multiple issues referred for evaluation for surgical treatment of refractory GERD and morbid obesity.  His gastroenterologist is Dr. Bryan Lemma.  He has been struggling with GI issues for at least the last 15 years.  He endorses daily heartburn, sour brash and malodorous belching.  The heartburn is throughout the day, it is worse with eating.  It is somewhat better at night as he does avoid provoking foods although he will occasionally wake up in the early morning with heartburn which he treats with drinking milk and then is able to go back to sleep.  His symptoms are fairly controlled with daily Protonix 40 mg twice a day but he continues to have breakthrough symptoms and does note significant worsening when he stops or decreases the proton pump inhibitor. He also has irritable bowel syndrome which previously was characterized mostly by diarrhea but now more so by constipation, he notes intermittent spells of severe crampy mid and lower abdominal pain which are somewhat improved with increase in dietary fiber and Bentyl.  He has  had a negative colonoscopy with random rectal biopsies. He follows with [name] at the current healthy weight [facility] wellness clinic beginning September 2021 and has lost about 16 pounds through diet modification and counseling there.  He has cut back on soda significantly. He is interested in bariatric surgery primarily for treatment of his significant reflux issues but additionally to endorse weight loss and treat his hepatic steatosis and insulin resistance.   PMH: Tobacco use, objective sleep apnea on CPAP, Epic chart lists history of schizophrenia, depression, bipolar disorder, anxiety, ADHD, chronic back pain, irritable bowel syndrome, hypothyroid, hyperlipidemia, hepatitis steatosis, constipation, asthma, 15 year history of GERD with 1 previous episode of GI bleeding.  This was around 2010 and on endoscopy he did have reflux esophagitis with erosions noted.  Insulin resistance recently starter on metformin  Denies any prior abdominal surgical history.  He has had numerous upper and lower endoscopies.   Covid + 11/23/20 Labs done August 02, 2020: CMP, lipid panel, TSH T3 and T4 all unremarkable; hemoglobin A1c was 5.5, vitamin D 25-hydroxy was slightly low, H pylori negative biopsies from upper endoscopy on June 07, 2020.  On the scope he did have an irregular Z line, pathology showed reflux changes, no Barrett's, mild gastritis and benign gastric polyps were noted.  He had a probable pH study at that time which noted good acid suppression on PPI with a DeMeester score of 2.7, no symptom correlation between symptoms and reflux events and the overall number of reflux episodes were normal. CT scan March 22, 2020 notable for hepatitis steatosis and mild hepatomegaly  Right upper quadrant ultrasound October 2020 negative for gallstones or sludge   Social Hx: he is a International aid/development worker logs; his schedule can be flexible. He does dip snuff but has been cutting back on this with aim of  quitting.  He is married with 2 children.  Family history notable for seizures, IBS, depression and anxiety and thyroid disease in his mother, colon polyps, clotting disorder, heart disease, cancer, depression, sleep apnea, obesity and leukemia in his father, brothers with bipolar disorder and ADHD and one paternal aunt with ulcerative colitis.  Allergies  Allergen Reactions   Ibuprofen Other (See Comments)    Makes ulcers bleed   Influenza Vaccines Shortness Of Breath and Other (See Comments)    Rash and unable to breathe well   Tylenol [Acetaminophen] Other (See Comments)    Bleeding ulcers   Bee Venom Swelling    SWELLING REACTION UNSPECIFIED    Tramadol Itching    Past Medical History:  Diagnosis Date   ADHD (attention deficit hyperactivity disorder)    Anal fissure    Anxiety    Asthma    Back pain    Bipolar disorder (HCC)    Chest pain    Chronic lower back pain    Constipation    Depression    Esophagitis    Distal esophageal erosions consistent with mild erosive  reflux esophagitis 12/2008 by EGD    External hemorrhoids    Gastric polyps    Gastric ulcer    GERD (gastroesophageal reflux disease)    Hepatic steatosis    Hiatal hernia    History of stomach ulcers    Hx MRSA infection 8/08   right thigh   Hx of colonic polyps    Hypercholesteremia    denies   Hypothyroidism    Internal hemorrhoids    Irritable bowel syndrome (IBS)    Obesity    Occult GI bleeding 12/2008   Trivial upper GI bleed/uncontrolled GERD by upper endoscopy 12/2008, normal f/u endoscopy 03/2009 with [facility]BCE at that time   OSA on CPAP    Pain management    Pneumonia 09/01/2015   "on ATB still" (09/04/2015)   S/P colonoscopy 11/10, 10/08   Dr [name]   Schizophrenia [name])    Sleep apnea    Stomach ulcer    Thyroid function test abnormal    Noted in 2011 discharge   Tobacco dipper    Wears contact lenses     Past Surgical History:  Procedure Laterality Date   ANKLE FRACTURE  SURGERY Left ~ 2008   BACK SURGERY     BIOPSY  11/02/2019   Procedure: BIOPSY;  Surgeon: Lavena Bullion, DO;  Location: [facility];  Service: Gastroenterology;;  EGD and Colon   BIOPSY  06/07/2020   Procedure: BIOPSY;  Surgeon: Lavena Bullion, DO;  Location: [facility];  Service: Gastroenterology;;   [place] STUDY N/A 06/07/2020   Procedure: BRAVO [place] STUDY on PPI;  Surgeon: Lavena Bullion, DO;  Location: [facility];  Service: Gastroenterology;  Laterality: N/A;   COLONOSCOPY  10/10/2009   anal papilla otherwise normal   COLONOSCOPY WITH PROPOFOL N/A 11/02/2019   Procedure: COLONOSCOPY WITH PROPOFOL;  Surgeon: Lavena Bullion, DO;  Location: [facility];  Service: Gastroenterology;  Laterality: N/A;   ESOPHAGOGASTRODUODENOSCOPY   04/07/2009   Normal esophagus, small hiatal hernia   ESOPHAGOGASTRODUODENOSCOPY  01/09/2009   Distal esophageal erosions consistent with mild erosive reflux esophagitis, otherwise normal esophagus, small hiatal herniaotherwise normal  stomach, D1-D2    ESOPHAGOGASTRODUODENOSCOPY  08/26/2007   Normal esophagus, a small hiatal/hernia, otherwise normal stomach D1 through D3   ESOPHAGOGASTRODUODENOSCOPY (EGD) WITH PROPOFOL N/A 11/02/2019   Procedure: ESOPHAGOGASTRODUODENOSCOPY (EGD) WITH PROPOFOL;  Surgeon: Lavena Bullion, DO;  Location: [facility];  Service: Gastroenterology;  Laterality: N/A;   ESOPHAGOGASTRODUODENOSCOPY (EGD) WITH PROPOFOL N/A 06/07/2020   Procedure: ESOPHAGOGASTRODUODENOSCOPY (EGD) WITH PROPOFOL;  Surgeon: Lavena Bullion, DO;  Location: [facility];  Service: Gastroenterology;  Laterality: N/A;   FRACTURE SURGERY     ileocolonoscopy  08/26/2007    A normal rectum, colon, and terminal ileum   INCISION AND DRAINAGE  09/04/2015   "reopened my back incsion"   LUMBAR LAMINECTOMY/DECOMPRESSION MICRODISCECTOMY Left 09/23/2017   Procedure: Microdiscectomy - left - Lumbar four-Lumbar five;  Surgeon: Earnie Larsson, MD;  Location: [facility];  Service: Neurosurgery;  Laterality: Left;   LUMBAR MICRODISCECTOMY  08/21/2015   LUMBAR MICRODISCECTOMY Left 09/23/2017   L4-5   LUMBAR WOUND DEBRIDEMENT N/A 09/04/2015   Procedure: LUMBAR WOUND DEBRIDEMENT;  Surgeon: Consuella Lose, MD;  Location: [facility];  Service: Neurosurgery;  Laterality: N/A;   right side sugery     enlarged lymph node gland removed under arm pit age 36-5 years   Small bowel capsule  04/11/2009    normal throughout   VASECTOMY      Family History  Problem Relation Age of Onset   Leukemia Father 38   Colon polyps Father    Clotting disorder Father    Heart disease Father    Cancer Father    Depression Father    Sleep apnea Father    Obesity Father    Seizures Mother    Irritable bowel syndrome Mother    Thyroid disease Mother    Depression Mother    Anxiety disorder Mother    Bipolar disorder Brother    ADD / ADHD Brother    Diabetes Paternal Grandmother    Ulcerative colitis Paternal Aunt    Colon cancer Neg Hx    Liver cancer Neg Hx    Esophageal cancer Neg Hx    Stomach cancer Neg Hx    Pancreatic cancer Neg Hx     Social History   Socioeconomic History   Marital status: Married    Spouse name: Tabitha   Number of children: 2   Years of education: Not on file   Highest education level: Not on file  Occupational History   Occupation: Firefighter    Employer: [employer]   Occupation: truck Geophysicist/field seismologist  Tobacco Use   Smoking status: Never Smoker   Smokeless tobacco: Current User    Types: Snuff   Tobacco comment: quitting snuff. whinning off   Vaping Use   Vaping Use: Never used  Substance and Sexual Activity   Alcohol use: Yes    Alcohol/week: 0.0 standard drinks    Comment: rare   Drug use: No   Sexual activity: Not Currently  Other Topics Concern   Not on file  Social History Narrative   Not on file   Social Determinants of Health   Financial Resource Strain: Not on file  Food Insecurity: Not on file   Transportation Needs: Not on file  Physical Activity: Not on file  Stress: Not on file  Social Connections: Not on file    Current Outpatient Medications on File Prior to Visit  Medication Sig Dispense Refill   dicyclomine (BENTYL) 10 MG capsule Take 1 capsule (10 mg total) by  mouth 4 (four) times daily -  before meals and at bedtime. 90 capsule 0   gabapentin (NEURONTIN) 300 MG capsule TAKE 1 CAPSULE BY MOUTH THREE TIMES DAILY 90 capsule 0   [START ON 06/09/2021] ADDERALL XR 20 MG 24 hr capsule Take 1 capsule (20 mg total) by mouth daily. 30 capsule 0   [START ON 05/10/2021] ADDERALL XR 20 MG 24 hr capsule Take 1 capsule (20 mg total) by mouth daily. 30 capsule 0   ADDERALL XR 20 MG 24 hr capsule Take 1 capsule (20 mg total) by mouth daily. 30 capsule 0   cyclobenzaprine (FLEXERIL) 10 MG tablet Take 1 tablet by mouth three times daily as needed for muscle spasm 90 tablet 3   docusate sodium (COLACE) 250 MG capsule Take 250 mg by mouth 2 (two) times daily.     EPINEPHrine 0.3 mg/0.3 mL IJ SOAJ injection Inject 0.3 mg into the muscle as needed for anaphylaxis.     EUTHYROX 75 MCG tablet TAKE 1 TABLET BY MOUTH ONCE DAILY BEFORE BREAKFAST 90 tablet 2   lamoTRIgine (LAMICTAL) 25 MG tablet Take 2 tablets by mouth twice daily 120 tablet 2   lidocaine (LIDODERM) 5 % Place 1 patch onto the skin daily. Remove & Discard patch within 12 hours or as directed by MD 30 patch 0   pantoprazole (PROTONIX) 40 MG tablet Take 1 tablet by mouth twice daily 180 tablet 0   Vitamin D, Ergocalciferol, (DRISDOL) 1.25 MG (50000 UNIT) CAPS capsule TAKE 1 CAPSULE BY MOUTH ONCE A WEEK EVERY  7  DAYS 12 capsule 0   No current facility-administered medications on file prior to visit.    Review of Systems: a complete, 10pt review of systems was completed with pertinent positives and negatives as documented in the HPI  Physical Exam: Weight: 391.25 lb   Height: 68 in  Body Surface Area: 2.72 m   Body Mass Index: 59.49  kg/m   Temp.: 99 F    Pulse: 138 (Regular)    P.OX: 98% (Room air) BP: 132/98(Sitting, Left Arm, Standard)  He is alert, cooperative, unlabored respirations   CBC Latest Ref Rng & Units 03/22/2020 03/01/2020 09/06/2019  WBC 4.0 - 10.5 K/uL 11.5(H) 7.9 11.4(H)  Hemoglobin 13.0 - 17.0 g/dL 15.1 15.3 16.0  Hematocrit 39.0 - 52.0 % 45.8 46.1 49.6  Platelets 150 - 400 K/uL 300 293 314    CMP Latest Ref Rng & Units 08/02/2020 03/22/2020 12/01/2019  Glucose 65 - 99 mg/dL 97 87 91  BUN 6 - 20 mg/dL 16 12 7   Creatinine 0.76 - 1.27 mg/dL 0.99 0.92 1.02  Sodium 134 - 144 mmol/L 138 137 140  Potassium 3.5 - 5.2 mmol/L 4.3 3.7 4.3  Chloride 96 - 106 mmol/L 101 102 103  CO2 20 - 29 mmol/L 25 24 25   Calcium 8.7 - 10.2 mg/dL 9.3 9.0 9.3  Total Protein 6.0 - 8.5 g/dL 6.3 7.0 6.5  Total Bilirubin 0.0 - 1.2 mg/dL 0.4 1.0 0.4  Alkaline Phos 48 - 121 IU/L 60 56 75  AST 0 - 40 IU/L 13 23 21   ALT 0 - 44 IU/L 19 32 28    Lab Results  Component Value Date   INR 1.0 03/22/2020   INR 1.02 03/01/2010    Imaging: No results found.   A/P:  MORBID OBESITY (E66.01), GERD (GASTROESOPHAGEAL REFLUX DISEASE) (K21.9) lthough probably pH study demonstrated good acid suppression he continues to have breakthrough symptoms on high-dose proton pump inhibitor. Given  his habitus he is not a candidate for fundoplication. I recommend he consider Roux-en-Y gastric bypass. We discussed the surgery using the diagram to demonstrate the relevant anatomy including technical aspects, the risks of bleeding, infection, pain, scarring, injury to intra-abdominal structures, staple line/ anastomotic leak or abscess, chronic abdominal pain or nausea/ worsening of irritable bowel syndrome complex, ulcer formation, internal hernia or bowel obstruction, DVT/PE, pneumonia, heart attack, stroke, death, failure to reach weight loss goals and weight regain, hernia. Discussed the typical pre-, peri-, and postoperative course. Discussed the  importance of lifelong behavioral changes to combat the chronic and relapsing disease which is obesity. Questions were answered. He is ready to proceed with surgery which is scheduled on the 28th of this month.  OSA ON CPAP (G47.33) TOBACCO ABUSE (Z72.0) Endorses a cessation and nicotine/creatinine testing has been negative. IRRITABLE BOWEL SYNDROME (K58.9) Story: Discussed it is difficult to predict the effect gastric bypass will have on his current quality of life from this standpoint. NONALCOHOLIC HEPATOSTEATOSIS (K76.0) HYPERLIPIDEMIA (E78.5) HYPOTHYROID (E03.9) Story: Synthroid 75 g daily, his primary care doctor is Dr. 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   Seasonal and perennial allergic rhinitis 07/23/2014   Abdominal mass of other site 06/15/2012   Abdominal cramps 06/15/2012   Obstructive sleep apnea 05/03/2012   Syncope and collapse 05/03/2012   Class 3 severe obesity with serious comorbidity and body mass index (BMI) of 50.0 to 59.9 in adult ([place]) 05/03/2012   Anal fissure 05/27/2011   UNSPECIFIED ANEMIA 09/11/2009   Blood in stool 04/06/2009   Lower abdominal pain 03/07/2009   BIPOLAR DISORDER UNSPECIFIED 03/06/2009   SMOKELESS TOBACCO ABUSE 03/06/2009   Attention deficit hyperactivity disorder (ADHD) 03/06/2009   GERD 03/06/2009   Diarrhea 03/06/2009       Romana Juniper, MD [facility]  See AMION to contact appropriate on-call provider

## 2021-05-03 ENCOUNTER — Telehealth: Payer: 59 | Admitting: Physician Assistant

## 2021-05-03 ENCOUNTER — Encounter: Payer: Self-pay | Admitting: Physician Assistant

## 2021-05-03 DIAGNOSIS — R109 Unspecified abdominal pain: Secondary | ICD-10-CM

## 2021-05-03 DIAGNOSIS — R300 Dysuria: Secondary | ICD-10-CM

## 2021-05-03 DIAGNOSIS — R10A0 Flank pain, unspecified side: Secondary | ICD-10-CM

## 2021-05-03 NOTE — Progress Notes (Signed)
Mr. samin, milke are scheduled for a virtual visit with your provider  today.      Just as we do with appointments in the office, we must obtain your consent  to participate.  Your consent will be active for this visit and any  virtual visit you may have with one of our providers in the next 365 days.       If you have a MyChart account, I can also send a copy of this consent to  you electronically.  All virtual visits are billed to your insurance  company just like a traditional visit in the office.  As this is a virtual  visit, video technology does not allow for your provider to perform a  traditional examination.  This may limit your provider's ability to fully  assess your condition.  If your provider identifies any concerns that need  to be evaluated in person or the need to arrange testing such as labs,  EKG, etc, we will make arrangements to do so.      Although advances in technology are sophisticated, we cannot ensure that  it will always work on either your end or our end.  If the connection with  a video visit is poor, we may have to switch to a telephone visit.  With  either a video or telephone visit, we are not always able to ensure that  we have a secure connection.   I need to obtain your verbal consent now.    Are you willing to proceed with your visit today?     [name] provided consent on 05/03/2021 for a virtual visit.       [name] Daring, PA-C  05/03/2021  3:50 PM        MyChart Video Visit        Virtual Visit via Video Note     This visit type was conducted due to national recommendations for  restrictions regarding the COVID-19 Pandemic (e.g. social distancing) in  an effort to limit this patient's exposure and mitigate transmission in  our community. This patient is at least at moderate risk for complications  without adequate follow up. This format is felt to be most appropriate for  this patient at this time. Physical exam was limited by quality of the   video and audio technology used for the visit.     Patient location: Home  Provider location: Home office in [place]    I discussed the limitations of evaluation and management by telemedicine  and the availability of in person appointments. The patient expressed  understanding and agreed to proceed.    Patient: [name]   DOB: 01/11/1986   35 y.o. Male  MRN: [id number]  Visit Date: 05/03/2021    Today's healthcare provider: [name] Daring, PA-C     No chief complaint on file.    Subjective      Dysuria   This is a new problem. The problem has been gradually worsening. The  quality of the pain is described as stabbing and shooting. The pain is at  a severity of 8/10. Maximum temperature: felt feverish; temperature not  taken. Associated symptoms include flank pain (left) and nausea. Pertinent  negatives include no chills, discharge, frequency, hematuria, urgency or  vomiting. He has tried increased fluids for the symptoms. The treatment  provided no relief. His past medical history is significant for kidney  stones (mom and dad). There is no history of catheterization or  recurrent  UTIs.        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    Seasonal and perennial allergic rhinitis 07/23/2014    Abdominal mass of other site 06/15/2012    Abdominal cramps 06/15/2012    Obstructive sleep apnea 05/03/2012    Syncope and collapse 05/03/2012    Class 3 severe obesity with serious comorbidity and body mass index (BMI)  of 50.0 to 59.9 in adult ([place]) 05/03/2012    Anal fissure  05/27/2011    UNSPECIFIED ANEMIA 09/11/2009    Blood in stool 04/06/2009    Lower abdominal pain 03/07/2009    BIPOLAR DISORDER UNSPECIFIED 03/06/2009    SMOKELESS TOBACCO ABUSE 03/06/2009    Attention deficit hyperactivity disorder (ADHD) 03/06/2009    GERD 03/06/2009    Diarrhea 03/06/2009     Past Medical History:   Diagnosis Date    ADHD (attention deficit hyperactivity disorder)     Anal fissure     Anxiety     Asthma     Back pain     Bipolar disorder (HCC)     Chest pain     Chronic lower back pain     Constipation     Depression     Esophagitis     Distal esophageal erosions consistent with mild erosive  reflux  esophagitis 12/2008 by EGD     External hemorrhoids     Gastric polyps     Gastric ulcer     GERD (gastroesophageal reflux disease)     Hepatic steatosis     Hiatal hernia     History of stomach ulcers     Hx MRSA infection 8/08    right thigh    Hx of colonic polyps     Hypercholesteremia     denies    Hypothyroidism     Internal hemorrhoids     Irritable bowel syndrome (IBS)     Obesity     Occult GI bleeding 12/2008    Trivial upper GI bleed/uncontrolled GERD by upper endoscopy 12/2008,  normal f/u endoscopy 03/2009 with [facility] at that time    OSA on CPAP     Pain management     Pneumonia 09/01/2015    "on ATB still" (09/04/2015)    S/P colonoscopy 11/10, 10/08    Dr [name]    Schizophrenia [facility])     Sleep apnea     Stomach ulcer     Thyroid function test abnormal     Noted in 2011 discharge    Tobacco dipper     Wears contact lenses           Medications:  Outpatient Medications Prior to Visit   Medication Sig    dicyclomine (BENTYL) 10 MG capsule Take 1 capsule (10 mg total) by mouth  4 (four) times daily -  before meals and at bedtime.    gabapentin (NEURONTIN) 300 MG capsule TAKE 1 CAPSULE BY MOUTH THREE TIMES  DAILY    [START ON 06/09/2021] ADDERALL  XR 20 MG 24 hr capsule Take 1 capsule (20  mg total) by mouth daily.    [START ON  05/10/2021] ADDERALL XR 20 MG 24 hr capsule Take 1 capsule (20  mg total) by mouth daily.    ADDERALL XR 20 MG 24 hr capsule Take 1 capsule (20 mg total) by mouth  daily.    cyclobenzaprine (FLEXERIL) 10 MG tablet Take 1 tablet by mouth three  times daily as needed for muscle spasm    docusate sodium (COLACE) 250 MG capsule Take 250 mg by mouth 2 (two)  times daily.    EPINEPHrine 0.3 mg/0.3 mL IJ SOAJ injection Inject 0.3 mg into the muscle  as needed for anaphylaxis.    EUTHYROX 75 MCG tablet TAKE 1 TABLET BY MOUTH ONCE DAILY BEFORE BREAKFAST     lamoTRIgine (LAMICTAL) 25 MG tablet Take 2 tablets by mouth twice daily    lidocaine (LIDODERM) 5 % Place 1 patch onto the skin daily. Remove &  Discard patch within 12 hours or as directed by MD    pantoprazole (PROTONIX) 40 MG tablet Take 1 tablet by mouth twice daily    Vitamin D, Ergocalciferol, (DRISDOL) 1.25 MG (50000 UNIT) CAPS capsule  TAKE 1 CAPSULE BY MOUTH ONCE A WEEK EVERY  7  DAYS     No facility-administered medications prior to visit.       Review of Systems   Constitutional:  Negative for chills.   Respiratory: Negative.     Cardiovascular: Negative.    Gastrointestinal:  Positive for nausea. Negative for vomiting.   Genitourinary:  Positive for dysuria and flank pain (left). Negative for  difficulty urinating, frequency, hematuria, penile discharge, penile pain,  penile swelling and urgency.        Urine darker in color today and has slight odor   Musculoskeletal:  Positive for back pain.     Last CBC  Lab Results   Component Value Date    WBC 11.5 (H) 03/22/2020    HGB 15.1 03/22/2020    HCT 45.8 03/22/2020    MCV 85.8 03/22/2020    MCH 28.3 03/22/2020    RDW 14.3 03/22/2020    PLT 300 32/35/5732     Last metabolic panel  Lab Results   Component Value Date    GLUCOSE 97 08/02/2020    NA 138 08/02/2020    K 4.3 08/02/2020    CL 101 08/02/2020    CO2 25 08/02/2020    BUN 16 08/02/2020    CREATININE 0.99 08/02/2020     GFRNONAA 99 08/02/2020    GFRAA 114 08/02/2020    CALCIUM 9.3 08/02/2020    PHOS 4.3 05/03/2012    PROT 6.3 08/02/2020    ALBUMIN 4.0 08/02/2020    LABGLOB 2.3 08/02/2020    AGRATIO 1.7 08/02/2020    BILITOT 0.4 08/02/2020    ALKPHOS 60 08/02/2020    AST 13 08/02/2020    ALT 19 08/02/2020    ANIONGAP 11 03/22/2020         Objective      There were no vitals taken for this visit.  BP Readings from Last 3 Encounters:   04/30/21 (!) 141/95   04/03/21 137/84   03/05/21 128/76     Wt Readings from Last 3 Encounters:   04/30/21 (!) 385 lb (174.6 kg)   04/03/21 (!) 414 lb (187.8 kg)   03/05/21 (!) 407 lb 3.2 oz (184.7 kg)  Physical Exam  Vitals reviewed.   Constitutional:       General: He is not in acute distress.     Appearance: Normal appearance. He is well-developed. He is obese. He is  not ill-appearing.   HENT:      Head: Normocephalic and atraumatic.   Eyes:      Conjunctiva/sclera: Conjunctivae normal.   Pulmonary:      Effort: Pulmonary effort is normal. No respiratory distress.   Musculoskeletal:      Cervical back: Normal range of motion and neck supple.   Neurological:      Mental Status: He is alert.   Psychiatric:         Mood and Affect: Mood normal.         Behavior: Behavior normal.         Thought Content: Thought content normal.         Judgment: Judgment normal.           Assessment & Plan        1. Dysuria  - Suspicious for kidney/ureteral stone with superimposed infection  - Patient advised to seek in-person evaluation at local UC/ER  - He voices understanding and agrees to proceed to closest [place]    2. Flank pain  - See above medical treatment plan.      No follow-ups on file.        I discussed the assessment and treatment plan with the patient. The  patient was provided an opportunity to ask questions and all were  answered. The patient agreed with the plan and demonstrated an  understanding of the instructions.      The patient was advised to call back or seek an in-person evaluation if  the symptoms worsen or if the condition fails to improve as anticipated.    I provided 12 minutes of face-to-face time during this encounter via  MyChart Video enabled encounter.      Rubye Beach  [place]  [phone] (phone)  [phone] (fax)    [place]

## 2021-05-03 NOTE — Patient Instructions (Signed)
Kidney Stones Kidney stones are rock-like masses that form inside of the kidneys. Kidneys are organs that make pee (urine). A kidney stone may move into other parts of the urinary tract, including: The tubes that connect the kidneys to the bladder (ureters). The bladder. The tube that carries urine out of the body (urethra). Kidney stones can cause very bad pain and can block the flow of pee. The stone usually leaves your body (passes) through your pee. You may need to have a doctor take out the stone. What are the causes? Kidney stones may be caused by: A condition in which certain glands make too much parathyroid hormone (primary hyperparathyroidism). A buildup of a type of crystals in the bladder made of a chemical called uric acid. The body makes uric acid when you eat certain foods. Narrowing (stricture) of one or both of the ureters. A kidney blockage that you were born with. Past surgery on the kidney or the ureters, such as gastric bypass surgery. What increases the risk? You are more likely to develop this condition if: You have had a kidney stone in the past. You have a family history of kidney stones. You do not drink enough water. You eat a diet that is high in protein, salt (sodium), or sugar. You are overweight or very overweight (obese). What are the signs or symptoms? Symptoms of a kidney stone may include: Pain in the side of the belly, right below the ribs (flank pain). Pain usually spreads (radiates) to the groin. Needing to pee often or right away (urgently). Pain when going pee (urinating). Blood in your pee (hematuria). Feeling like you may vomit (nauseous). Vomiting. Fever and chills. How is this treated? Treatment depends on the size, location, and makeup of the kidney stones. The stones will often pass out of the body through peeing. You may need to: Drink more fluid to help pass the stone. In some cases, you may be given fluids through an IV tube put into one  of your veins at the hospital. Take medicine for pain. Make changes in your diet to help keep kidney stones from coming back. Sometimes, medical procedures are needed to remove a kidney stone. This may involve: A procedure to break up kidney stones using a beam of light (laser) or shock waves. Surgery to remove the kidney stones. Follow these instructions at home: Medicines Take over-the-counter and prescription medicines only as told by your doctor. Ask your doctor if the medicine prescribed to you requires you to avoid driving or using heavy machinery. Eating and drinking Drink enough fluid to keep your pee pale yellow. You may be told to drink at least 8-10 glasses of water each day. This will help you pass the stone. If told by your doctor, change your diet. This may include: Limiting how much salt you eat. Eating more fruits and vegetables. Limiting how much meat, poultry, fish, and eggs you eat. Follow instructions from your doctor about eating or drinking restrictions. General instructions Collect pee samples as told by your doctor. You may need to collect a pee sample: 24 hours after a stone comes out. 8-12 weeks after a stone comes out, and every 6-12 months after that. Strain your pee every time you pee (urinate), for as long as told. Use the strainer that your doctor recommends. Do not throw out the stone. Keep it so that it can be tested by your doctor. Keep all follow-up visits as told by your doctor. This is important. You may need   follow-up tests. How is this prevented? To prevent another kidney stone: Drink enough fluid to keep your pee pale yellow. This is the best way to prevent kidney stones. Eat healthy foods. Avoid certain foods as told by your doctor. You may be told to eat less protein. Stay at a healthy weight. Where to find more information [place] ([facility]): www.kidney.[facility] [facility]): www.urologyhealth.org Contact a doctor  if: You have pain that gets worse or does not get better with medicine. Get help right away if: You have a fever or chills. You get very bad pain. You get new pain in your belly (abdomen). You pass out (faint). You cannot pee. Summary Kidney stones are rock-like masses that form inside of the kidneys. Kidney stones can cause very bad pain and can block the flow of pee. The stones will often pass out of the body through peeing. Drink enough fluid to keep your pee pale yellow. This information is not intended to replace advice given to you by your health care provider. Make sure you discuss any questions you have with your healthcare provider. Document Revised: 03/26/2019 Document Reviewed: 03/30/2019 Elsevier Patient Education  [place].

## 2021-05-07 ENCOUNTER — Other Ambulatory Visit (HOSPITAL_COMMUNITY): Payer: Self-pay

## 2021-05-11 ENCOUNTER — Telehealth: Payer: Self-pay | Admitting: Family Medicine

## 2021-05-11 NOTE — Telephone Encounter (Signed)
Patient is having gastric bypass on 06/28 the surgeon told him that he will no longer be able to take the Adderall XR. Please advise

## 2021-05-11 NOTE — Telephone Encounter (Signed)
I've not heard anything of the sort from his Specialist.  However, it may simply be that as he loses weight we might need to adjust the dosage.  I would not recommend taking it the week before the surgery.  Please see if his specialist can send me the last OV notes so I can better understand.  I'm not aware of gastric surgery causing inability to use Adderall but he may be worried that it would cause side effects after a bariatric procedure?

## 2021-05-11 NOTE — Patient Instructions (Addendum)
DUE TO COVID-19 ONLY ONE VISITOR IS ALLOWED TO COME WITH YOU AND STAY IN THE WAITING ROOM ONLY DURING PRE OP AND PROCEDURE DAY OF SURGERY. THE 2 VISITORS  MAY VISIT WITH YOU AFTER SURGERY IN YOUR PRIVATE ROOM DURING VISITING HOURS ONLY!  YOU NEED TO HAVE A COVID 19 TEST ON__6/24_____ @__9 :00_____, THIS TEST MUST BE DONE BEFORE SURGERY,  COVID TESTING SITE [place] [address], IT IS ON THE RIGHT GOING OUT [address] APPROXIMATELY  2 MINUTES PAST [employer] ON THE RIGHT. ONCE YOUR COVID TEST IS COMPLETED,  PLEASE BEGIN THE QUARANTINE INSTRUCTIONS AS OUTLINED IN YOUR HANDOUT.                [name]     Your procedure is scheduled on: 05/22/21   Report to [facility] Main  Entrance   Report to admitting at   8:45 AM     Call this number if you have problems the morning of surgery [name], NO CHEWING GUM [name].    NO SOLID FOOD AFTER 6:00 PM THE NIGHT BEFORE YOUR SURGERY.   YOU MAY DRINK CLEAR FLUIDS until 7:30 am  THE G2 DRINK YOU DRINK BEFORE YOU LEAVE HOME WILL BE THE LAST FLUIDS YOU DRINK BEFORE SURGERY.  PAIN IS EXPECTED AFTER SURGERY AND WILL NOT BE COMPLETELY ELIMINATED.   AMBULATION AND TYLENOL WILL HELP REDUCE INCISIONAL AND GAS PAIN. MOVEMENT IS KEY!  YOU ARE EXPECTED TO BE OUT OF BED WITHIN 4 HOURS OF ADMISSION TO YOUR PATIENT ROOM.  SITTING IN THE RECLINER THROUGHOUT THE DAY IS IMPORTANT FOR DRINKING FLUIDS AND MOVING GAS THROUGHOUT THE GI TRACT.  COMPRESSION STOCKINGS SHOULD BE WORN [name] UNLESS YOU ARE WALKING.   INCENTIVE SPIROMETER SHOULD BE USED EVERY HOUR WHILE AWAKE TO DECREASE POST-OPERATIVE COMPLICATIONS SUCH AS PNEUMONIA.  WHEN DISCHARGED HOME, IT IS IMPORTANT TO CONTINUE TO WALK EVERY HOUR AND USE THE INCENTIVE SPIROMETER EVERY HOUR.             Take these medicines the morning of surgery with A  SIP OF WATER: Lamotrigine, Zyrtec, Euthyrox, Protonix                                You may not have any metal on your body including              piercings  Do not wear jewelry, lotions, powders or deodorant                          Men may shave face and neck.   Do not bring valuables to the hospital. [name].  Contacts, dentures or bridgework may not be worn into surgery.                    Please read over the following fact sheets you were given: _____________________________________________________________________             [facility] - Preparing for Surgery Before surgery, you can play an important role.  Because skin is not sterile, your skin needs to be as free of germs as possible.  You can reduce the number of  germs on your skin by washing with CHG (chlorahexidine gluconate) soap before surgery.  CHG is an antiseptic cleaner which kills germs and bonds with the skin to continue killing germs even after washing. Please DO NOT use if you have an allergy to CHG or antibacterial soaps.  If your skin becomes reddened/irritated stop using the CHG and inform your nurse when you arrive at Short Stay.   You may shave your face/neck.  Please follow these instructions carefully:  1.  Shower with CHG Soap the night before surgery and the  morning of Surgery.  2.  If you choose to wash your hair, wash your hair first as usual with your  normal  shampoo.  3.  After you shampoo, rinse your hair and body thoroughly to remove the  shampoo.                                        4.  Use CHG as you would any other liquid soap.  You can apply chg directly  to the skin and wash                       Gently with a scrungie or clean washcloth.  5.  Apply the CHG Soap to your body ONLY FROM THE NECK DOWN.   Do not use on face/ open                           Wound or open sores. Avoid contact with eyes, ears mouth and genitals (private parts).                        Wash face,  Genitals (private parts) with your normal soap.             6.  Wash thoroughly, paying special attention to the area where your surgery  will be performed.  7.  Thoroughly rinse your body with warm water from the neck down.  8.  DO NOT shower/wash with your normal soap after using and rinsing off  the CHG Soap.             9.  Pat yourself dry with a clean towel.            10.  Wear clean pajamas.            11.  Place clean sheets on your bed the night of your first shower and do not  sleep with pets. Day of Surgery : Do not apply any lotions/deodorants the morning of surgery.  Please wear clean clothes to the hospital/surgery center.  FAILURE TO FOLLOW THESE INSTRUCTIONS MAY RESULT IN THE CANCELLATION OF YOUR SURGERY PATIENT SIGNATURE_________________________________  NURSE SIGNATURE__________________________________  ________________________________________________________________________   [name]  An incentive spirometer is a tool that can help keep your lungs clear and active. This tool measures how well you are filling your lungs with each breath. Taking long deep breaths may help reverse or decrease the chance of developing breathing (pulmonary) problems (especially infection) following: A long period of time when you are unable to move or be active. BEFORE THE PROCEDURE  If the spirometer includes an indicator to show your best effort, your nurse or respiratory therapist will set it to a desired goal. If possible, sit up straight or lean slightly forward.  Try not to slouch. Hold the incentive spirometer in an upright position. INSTRUCTIONS FOR USE  Sit on the edge of your bed if possible, or sit up as far as you can in bed or on a chair. Hold the incentive spirometer in an upright position. Breathe out normally. Place the mouthpiece in your mouth and seal your lips tightly around it. Breathe in slowly and as deeply as possible,  raising the piston or the ball toward the top of the column. Hold your breath for 3-5 seconds or for as long as possible. Allow the piston or ball to fall to the bottom of the column. Remove the mouthpiece from your mouth and breathe out normally. Rest for a few seconds and repeat Steps 1 through 7 at least 10 times every 1-2 hours when you are awake. Take your time and take a few normal breaths between deep breaths. The spirometer may include an indicator to show your best effort. Use the indicator as a goal to work toward during each repetition. After each set of 10 deep breaths, practice coughing to be sure your lungs are clear. If you have an incision (the cut made at the time of surgery), support your incision when coughing by placing a pillow or rolled up towels firmly against it. Once you are able to get out of bed, walk around indoors and cough well. You may stop using the incentive spirometer when instructed by your caregiver.  RISKS AND COMPLICATIONS Take your time so you do not get dizzy or light-headed. If you are in pain, you may need to take or ask for pain medication before doing incentive spirometry. It is harder to take a deep breath if you are having pain. AFTER USE Rest and breathe slowly and easily. It can be helpful to keep track of a log of your progress. Your caregiver can provide you with a simple table to help with this. If you are using the spirometer at home, follow these instructions: [place] IF:  You are having difficultly using the spirometer. You have trouble using the spirometer as often as instructed. Your pain medication is not giving enough relief while using the spirometer. You develop fever of 100.5 F (38.1 C) or higher. SEEK IMMEDIATE MEDICAL CARE IF:  You cough up bloody sputum that had not been present before. You develop fever of 102 F (38.9 C) or greater. You develop worsening pain at or near the incision site. MAKE SURE YOU:  Understand  these instructions. Will watch your condition. Will get help right away if you are not doing well or get worse. Document Released: 03/24/2007 Document Revised: 02/03/2012 Document Reviewed: 05/25/2007 [facility] Patient Information 2014 [place], [place].   ________________________________________________________________________

## 2021-05-13 ENCOUNTER — Other Ambulatory Visit: Payer: Self-pay | Admitting: Gastroenterology

## 2021-05-14 ENCOUNTER — Encounter (HOSPITAL_COMMUNITY): Payer: Self-pay

## 2021-05-14 ENCOUNTER — Other Ambulatory Visit: Payer: Self-pay

## 2021-05-14 ENCOUNTER — Encounter (HOSPITAL_COMMUNITY)
Admission: RE | Admit: 2021-05-14 | Discharge: 2021-05-14 | Disposition: A | Discharge status: Home / Self Care | Payer: 59 | Source: Ambulatory Visit | Attending: Surgery | Admitting: Surgery

## 2021-05-14 DIAGNOSIS — Z01812 Encounter for preprocedural laboratory examination: Secondary | ICD-10-CM | POA: Diagnosis not present

## 2021-05-14 HISTORY — DX: Unspecified osteoarthritis, unspecified site: M19.90

## 2021-05-14 LAB — COMPREHENSIVE METABOLIC PANEL
ALT: 27 U/L (ref 0–44)
AST: 24 U/L (ref 15–41)
Albumin: 4.1 g/dL (ref 3.5–5.0)
Alkaline Phosphatase: 61 U/L (ref 38–126)
Anion gap: 6 (ref 5–15)
BUN: 17 mg/dL (ref 6–20)
CO2: 29 mmol/L (ref 22–32)
Calcium: 9.3 mg/dL (ref 8.9–10.3)
Chloride: 103 mmol/L (ref 98–111)
Creatinine, Ser: 1.04 mg/dL (ref 0.61–1.24)
GFR, Estimated: >60 mL/min (ref >60)
Glucose, Bld: 93 mg/dL (ref 70–99)
Potassium: 4 mmol/L (ref 3.5–5.1)
Sodium: 138 mmol/L (ref 135–145)
Total Bilirubin: 0.3 mg/dL (ref 0.3–1.2)
Total Protein: 7.1 g/dL (ref 6.5–8.1)

## 2021-05-14 LAB — CBC WITH DIFFERENTIAL/PLATELET
Abs Immature Granulocytes: 0.04 10*3/uL (ref 0.00–0.07)
Basophils Absolute: 0 10*3/uL (ref 0.0–0.1)
Basophils Relative: 1 %
Eosinophils Absolute: 0.1 10*3/uL (ref 0.0–0.5)
Eosinophils Relative: 2 %
HCT: 43.7 % (ref 39.0–52.0)
Hemoglobin: 14.2 g/dL (ref 13.0–17.0)
Immature Granulocytes: 1 %
Lymphocytes Relative: 28 %
Lymphs Abs: 2.2 10*3/uL (ref 0.7–4.0)
MCH: 27.7 pg (ref 26.0–34.0)
MCHC: 32.5 g/dL (ref 30.0–36.0)
MCV: 85.4 fL (ref 80.0–100.0)
Monocytes Absolute: 0.4 10*3/uL (ref 0.1–1.0)
Monocytes Relative: 6 %
Neutro Abs: 5 10*3/uL (ref 1.7–7.7)
Neutrophils Relative %: 62 %
Platelets: 256 10*3/uL (ref 150–400)
RBC: 5.12 MIL/uL (ref 4.22–5.81)
RDW: 14.5 % (ref 11.5–15.5)
WBC: 7.9 10*3/uL (ref 4.0–10.5)
nRBC: 0 % (ref 0.0–0.2)

## 2021-05-14 NOTE — Telephone Encounter (Signed)
Patient states he just cannot take extended release, can take immediate release.

## 2021-05-14 NOTE — Progress Notes (Signed)
COVID Vaccine Completed:No Date COVID Vaccine completed: COVID vaccine manufacturer: [place]   PCP - Ronnie Doss DO Cardiologist - none . Cardiac work up was done and he was Dx with hiatal hernia.  Chest x-ray - 12/29/20-epic EKG - 08/02/20-epic Stress Test - no ECHO - 05/04/12-epic Cardiac Cath - NA Pacemaker/ICD device last checked:NA  Sleep Study - yes CPAP - yes  Fasting Blood Sugar - NA Checks Blood Sugar _____ times a day  Blood Thinner Instructions:NA Aspirin Instructions: Last Dose:  Anesthesia review:   Patient denies shortness of breath, fever, cough and chest pain at PAT appointment Yes. Pt reports that he can climb 4 flights of stairs, work around the house and ADLs without SOB. He is 406 lbs with a BMI of 59.9.  Patient verbalized understanding of instructions that were given to them at the PAT appointment. Patient was also instructed that they will need to review over the PAT instructions again at home before surgery. Yes  Bari bed was ordered for DOS.

## 2021-05-15 ENCOUNTER — Telehealth: Payer: Self-pay | Admitting: Skilled Nursing Facility1

## 2021-05-15 NOTE — Telephone Encounter (Signed)
Please have his specialist send me notes.

## 2021-05-15 NOTE — Telephone Encounter (Signed)
Returned pt's call. LVM.

## 2021-05-16 NOTE — Telephone Encounter (Signed)
Specialist is faxing over notes.

## 2021-05-18 ENCOUNTER — Other Ambulatory Visit (HOSPITAL_COMMUNITY)
Admission: RE | Admit: 2021-05-18 | Discharge: 2021-05-18 | Disposition: A | Discharge status: Home / Self Care | Payer: 59 | Source: Ambulatory Visit | Attending: Surgery | Admitting: Surgery

## 2021-05-18 DIAGNOSIS — Z01812 Encounter for preprocedural laboratory examination: Secondary | ICD-10-CM | POA: Insufficient documentation

## 2021-05-18 DIAGNOSIS — Z20822 Contact with and (suspected) exposure to covid-19: Secondary | ICD-10-CM | POA: Diagnosis not present

## 2021-05-18 LAB — SARS CORONAVIRUS 2 (TAT 6-24 HRS): SARS Coronavirus 2: NEGATIVE

## 2021-05-21 MED ORDER — BUPIVACAINE LIPOSOME 1.3 % IJ SUSP
20.0000 mL | Freq: Once | INTRAMUSCULAR | Status: DC
  Filled 2021-05-21: qty 20

## 2021-05-21 MED ORDER — SODIUM CHLORIDE 0.9 % IV SOLN
2.0000 g | INTRAVENOUS | Status: AC
  Filled 2021-05-21: qty 2

## 2021-05-22 LAB — TYPE AND SCREEN
ABO/RH(D): A POS
Antibody Screen: NEGATIVE

## 2021-05-24 ENCOUNTER — Other Ambulatory Visit (HOSPITAL_COMMUNITY): Payer: Self-pay

## 2021-05-24 ENCOUNTER — Other Ambulatory Visit: Payer: Self-pay | Admitting: Family Medicine

## 2021-05-24 DIAGNOSIS — M5126 Other intervertebral disc displacement, lumbar region: Secondary | ICD-10-CM

## 2021-05-25 ENCOUNTER — Emergency Department (HOSPITAL_BASED_OUTPATIENT_CLINIC_OR_DEPARTMENT_OTHER)
Admission: EM | Admit: 2021-05-25 | Discharge: 2021-05-25 | Disposition: A | Discharge status: Home / Self Care | Payer: 59 | Attending: Emergency Medicine | Admitting: Emergency Medicine

## 2021-05-25 ENCOUNTER — Other Ambulatory Visit: Payer: Self-pay

## 2021-05-25 ENCOUNTER — Encounter (HOSPITAL_BASED_OUTPATIENT_CLINIC_OR_DEPARTMENT_OTHER): Payer: Self-pay | Admitting: *Deleted

## 2021-05-25 DIAGNOSIS — L02411 Cutaneous abscess of right axilla: Secondary | ICD-10-CM | POA: Diagnosis not present

## 2021-05-25 DIAGNOSIS — L02413 Cutaneous abscess of right upper limb: Secondary | ICD-10-CM | POA: Diagnosis not present

## 2021-05-25 DIAGNOSIS — E039 Hypothyroidism, unspecified: Secondary | ICD-10-CM | POA: Diagnosis not present

## 2021-05-25 DIAGNOSIS — L0291 Cutaneous abscess, unspecified: Secondary | ICD-10-CM

## 2021-05-25 DIAGNOSIS — Z87891 Personal history of nicotine dependence: Secondary | ICD-10-CM | POA: Insufficient documentation

## 2021-05-25 DIAGNOSIS — Z79899 Other long term (current) drug therapy: Secondary | ICD-10-CM | POA: Insufficient documentation

## 2021-05-25 MED ORDER — LIDOCAINE-EPINEPHRINE (PF) 2 %-1:200000 IJ SOLN
10.0000 mL | Freq: Once | INTRAMUSCULAR | Status: DC
  Filled 2021-05-25: qty 20

## 2021-05-25 MED ORDER — DOXYCYCLINE HYCLATE 100 MG PO CAPS: 100.0000 mg | ORAL_CAPSULE | Freq: Two times a day (BID) | ORAL | 0 refills | Status: DC

## 2021-05-25 NOTE — ED Provider Notes (Signed)
[facility] Provider Note   CSN: [id number] Arrival date & time: 05/25/21  1934     History Chief Complaint  Patient presents with   Abscess    arm    [name] is a 35 y.o. male.  35 yo M with a chief complaints of lesion to the right medial arm.  This was noticed a couple days ago.  Since then has opened and drained.  No fevers or chills.  No noted tick exposure.  Has developed a little bit of surrounding erythema.  Has a history of MRSA.  The history is provided by the patient.  Abscess Location:  Shoulder/arm Shoulder/arm abscess location:  R upper arm Abscess quality: draining, fluctuance, induration and redness   Red streaking: no   Duration:  2 days Progression:  Worsening Chronicity:  New Context: not injected drug use and not insect bite/sting   Relieved by:  Nothing Worsened by:  Nothing Ineffective treatments:  None tried Associated symptoms: no fever, no headaches and no vomiting       Past Medical History:  Diagnosis Date   ADHD (attention deficit hyperactivity disorder)    Anal fissure    Anxiety    Arthritis    back, shoulders, ankles, knees   Back pain    Bipolar disorder (HCC)    Chest pain    Chronic lower back pain    Constipation    Depression    Esophagitis    Distal esophageal erosions consistent with mild erosive  reflux esophagitis 12/2008 by EGD    External hemorrhoids    Gastric polyps    Gastric ulcer    GERD (gastroesophageal reflux disease)    Hepatic steatosis    Hiatal hernia    History of stomach ulcers    Hx MRSA infection 06/2007   right thigh   Hx of colonic polyps    Hypercholesteremia    denies   Hypothyroidism    Internal hemorrhoids    Irritable bowel syndrome (IBS)    Obesity    Occult GI bleeding 12/2008   Trivial upper GI bleed/uncontrolled GERD by upper endoscopy 12/2008, normal f/u endoscopy 03/2009 with SBCE at that time   OSA on CPAP    Pain management    Pneumonia  09/01/2015   "on ATB still" (09/04/2015)   S/P colonoscopy 11/10, 10/08   Dr [name]   Schizophrenia [facility])    Sleep apnea    Stomach ulcer    Thyroid function test abnormal    Noted in 2011 discharge   Tobacco dipper    Wears contact lenses     Patient Active Problem List   Diagnosis Date Noted   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   Gastritis and gastroduodenitis    Gastroesophageal reflux disease with esophagitis without hemorrhage 12/01/2019   Belching    Gastric polyps    Hematochezia    Irritable bowel syndrome with diarrhea    Grade II hemorrhoids    Polyp of ascending colon    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   Seasonal and perennial allergic rhinitis 07/23/2014   Abdominal mass of other site 06/15/2012   Abdominal cramps 06/15/2012   Obstructive sleep apnea 05/03/2012   Syncope and collapse 05/03/2012   Class 3 severe obesity with serious comorbidity and body mass index (BMI) of 50.0 to 59.9 in adult ([place]) 05/03/2012   Anal fissure 05/27/2011   UNSPECIFIED ANEMIA 09/11/2009   Blood in stool 04/06/2009  Lower abdominal pain 03/07/2009   BIPOLAR DISORDER UNSPECIFIED 03/06/2009   SMOKELESS TOBACCO ABUSE 03/06/2009   Attention deficit hyperactivity disorder (ADHD) 03/06/2009   GERD 03/06/2009   Diarrhea 03/06/2009    Past Surgical History:  Procedure Laterality Date   ANKLE FRACTURE SURGERY Left ~ 2008   BACK SURGERY     BIOPSY  11/02/2019   Procedure: BIOPSY;  Surgeon: Lavena Bullion, DO;  Location: [facility];  Service: Gastroenterology;;  EGD and Colon   BIOPSY  06/07/2020   Procedure: BIOPSY;  Surgeon: Lavena Bullion, DO;  Location: [facility];  Service: Gastroenterology;;   BRAVO [place] STUDY N/A 06/07/2020   Procedure: BRAVO [place] on PPI;  Surgeon: Lavena Bullion, DO;  Location: [facility];  Service: Gastroenterology;  Laterality: N/A;   COLONOSCOPY  10/10/2009   anal papilla otherwise  normal   COLONOSCOPY WITH PROPOFOL N/A 11/02/2019   Procedure: COLONOSCOPY WITH PROPOFOL;  Surgeon: Lavena Bullion, DO;  Location: [facility];  Service: Gastroenterology;  Laterality: N/A;   ESOPHAGOGASTRODUODENOSCOPY   04/07/2009   Normal esophagus, small hiatal hernia   ESOPHAGOGASTRODUODENOSCOPY  01/09/2009   Distal esophageal erosions consistent with mild erosive reflux esophagitis, otherwise normal esophagus, small hiatal herniaotherwise normal stomach, D1-D2    ESOPHAGOGASTRODUODENOSCOPY  08/26/2007   Normal esophagus, a small hiatal/hernia, otherwise normal stomach D1 through D3   ESOPHAGOGASTRODUODENOSCOPY (EGD) WITH PROPOFOL N/A 11/02/2019   Procedure: ESOPHAGOGASTRODUODENOSCOPY (EGD) WITH PROPOFOL;  Surgeon: Lavena Bullion, DO;  Location: [facility];  Service: Gastroenterology;  Laterality: N/A;   ESOPHAGOGASTRODUODENOSCOPY (EGD) WITH PROPOFOL N/A 06/07/2020   Procedure: ESOPHAGOGASTRODUODENOSCOPY (EGD) WITH PROPOFOL;  Surgeon: Lavena Bullion, DO;  Location: [facility];  Service: Gastroenterology;  Laterality: N/A;   FRACTURE SURGERY     ileocolonoscopy  08/26/2007    A normal rectum, colon, and terminal ileum   INCISION AND DRAINAGE  09/04/2015   "reopened my back incsion"   LUMBAR LAMINECTOMY/DECOMPRESSION MICRODISCECTOMY Left 09/23/2017   Procedure: Microdiscectomy - left - Lumbar four-Lumbar five;  Surgeon: Earnie Larsson, MD;  Location: [place];  Service: Neurosurgery;  Laterality: Left;   LUMBAR MICRODISCECTOMY  08/21/2015   LUMBAR MICRODISCECTOMY Left 09/23/2017   L4-5   LUMBAR WOUND DEBRIDEMENT N/A 09/04/2015   Procedure: LUMBAR WOUND DEBRIDEMENT;  Surgeon: Consuella Lose, MD;  Location: [place] [facility];  Service: Neurosurgery;  Laterality: N/A;   right side sugery     enlarged lymph node gland removed under arm pit age 51-5 years   Small bowel capsule  04/11/2009    normal throughout   VASECTOMY         Family History  Problem Relation Age of Onset    Leukemia Father 84   Colon polyps Father    Clotting disorder Father    Heart disease Father    Cancer Father    Depression Father    Sleep apnea Father    Obesity Father    Seizures Mother    Irritable bowel syndrome Mother    Thyroid disease Mother    Depression Mother    Anxiety disorder Mother    Bipolar disorder Brother    ADD / ADHD Brother    Diabetes Paternal Grandmother    Ulcerative colitis Paternal Aunt    Colon cancer Neg Hx    Liver cancer Neg Hx    Esophageal cancer Neg Hx    Stomach cancer Neg Hx    Pancreatic cancer Neg Hx     Social History   Tobacco Use  Smoking status: Never   Smokeless tobacco: Former    Types: Snuff    Quit date: 12/16/2020   Tobacco comments:    quitting snuff. whinning off   Vaping Use   Vaping Use: Never used  Substance Use Topics   Alcohol use: Yes    Alcohol/week: 0.0 standard drinks    Comment: rare   Drug use: No    Home Medications Prior to Admission medications   Medication Sig Start Date End Date Taking? Authorizing Provider  doxycycline (VIBRAMYCIN) 100 MG capsule Take 1 capsule (100 mg total) by mouth 2 (two) times daily. One po bid x 7 days 05/25/21  Yes Deno Etienne, DO  ADDERALL XR 20 MG 24 hr capsule Take 1 capsule (20 mg total) by mouth daily. 05/10/21   [name], DO  cetirizine (ZYRTEC) 10 MG tablet Take 10 mg by mouth daily.    [provider]  cyclobenzaprine (FLEXERIL) 10 MG tablet Take 1 tablet by mouth three times daily as needed for muscle spasm Patient taking differently: Take 10 mg by mouth 3 (three) times daily as needed for muscle spasms. 04/10/21   [name], DO  dicyclomine (BENTYL) 10 MG capsule TAKE 1 CAPSULE BY MOUTH 4 TIMES DAILY BEFORE MEAL(S) AND AT BEDTIME 05/14/21   Cirigliano, Vito V, DO  dicyclomine (BENTYL) 20 MG tablet TAKE 1 TABLET BY MOUTH 4 TIMES DAILY BEFORE MEAL(S) AND AT BEDTIME 05/14/21   Cirigliano, Vito V, DO  docusate sodium (COLACE) 250 MG capsule Take  250 mg by mouth daily.    [provider]  EPINEPHrine 0.3 mg/0.3 mL IJ SOAJ injection Inject 0.3 mg into the muscle as needed for anaphylaxis.    [provider]  EUTHYROX 75 MCG tablet TAKE 1 TABLET BY MOUTH ONCE DAILY BEFORE BREAKFAST Patient taking differently: Take 75 mcg by mouth daily before breakfast. 04/16/21   Ronnie Doss M, DO  gabapentin (NEURONTIN) 300 MG capsule TAKE 1 CAPSULE BY MOUTH THREE TIMES DAILY 05/25/21   Ronnie Doss M, DO  lamoTRIgine (LAMICTAL) 25 MG tablet Take 2 tablets by mouth twice daily Patient taking differently: Take 50 mg by mouth 2 (two) times daily. 04/10/21   [name], DO  lidocaine (LIDODERM) 5 % Place 1 patch onto the skin daily. Remove & Discard patch within 12 hours or as directed by MD Patient not taking: No sig reported 01/12/21   [name], DO  pantoprazole (PROTONIX) 40 MG tablet Take 1 tablet by mouth twice daily Patient taking differently: Take 40 mg by mouth 3 (three) times daily. 04/10/21   [name], DO  Vitamin D, Ergocalciferol, (DRISDOL) 1.25 MG (50000 UNIT) CAPS capsule TAKE 1 CAPSULE BY MOUTH ONCE A WEEK EVERY  7  DAYS Patient taking differently: Take 50,000 Units by mouth every Thursday. 10/24/20   [place], Joneen Boers, FNP    Allergies    Ibuprofen, Influenza vaccines, Tylenol [acetaminophen], Bee venom, and Tramadol  Review of Systems   Review of Systems  Constitutional:  Negative for chills and fever.  HENT:  Negative for congestion and facial swelling.   Eyes:  Negative for discharge and visual disturbance.  Respiratory:  Negative for shortness of breath.   Cardiovascular:  Negative for chest pain and palpitations.  Gastrointestinal:  Negative for abdominal pain, diarrhea and vomiting.  Musculoskeletal:  Negative for arthralgias and myalgias.  Skin:  Positive for color change. Negative for rash.  Neurological:  Negative for tremors, syncope and headaches.  Psychiatric/Behavioral:  Negative for confusion and dysphoric mood.    Physical Exam Updated Vital Signs BP 127/87   Pulse (!) 114   Temp 98.5 F (36.9 C) (Oral)   Resp 16   Ht 5\' 9"  (1.753 m)   Wt 124.7 kg   SpO2 96%   BMI 40.61 kg/m   Physical Exam Vitals and nursing note reviewed.  Constitutional:      Appearance: He is well-developed.  HENT:     Head: Normocephalic and atraumatic.  Eyes:     Pupils: Pupils are equal, round, and reactive to light.  Neck:     Vascular: No JVD.  Cardiovascular:     Rate and Rhythm: Normal rate and regular rhythm.     Heart sounds: No murmur heard.   No friction rub. No gallop.  Pulmonary:     Effort: No respiratory distress.     Breath sounds: No wheezing.  Abdominal:     General: There is no distension.     Tenderness: There is no abdominal tenderness. There is no guarding or rebound.  Musculoskeletal:        General: Normal range of motion.     Cervical back: Normal range of motion and neck supple.     Comments: Lesion to the right medial upper arm with ulceration.  No active drainage.  No fluctuance.  Mild surrounding erythema.  No red streaking.  Skin:    Coloration: Skin is not pale.     Findings: No rash.  Neurological:     Mental Status: He is alert and oriented to person, place, and time.  Psychiatric:        Behavior: Behavior normal.    ED Results / Procedures / Treatments   Labs (all labs ordered are listed, but only abnormal results are displayed) Labs Reviewed - No data to display  EKG None  Radiology No results found.  Procedures Procedures   Medications Ordered in ED Medications  lidocaine-EPINEPHrine (XYLOCAINE W/EPI) 2 %-1:200000 (PF) injection 10 mL (has no administration in time range)    ED Course  I have reviewed the triage vital signs and the nursing notes.  Pertinent labs & imaging results that were available during my care of the patient were reviewed by me and considered in my medical  decision making (see chart for details).    MDM Rules/Calculators/A&P                          35 yo M with a chief complaints of a abscess.  This opened and drained at home.  He has had some spreading erythema and so came here for evaluation.  No drainable lesion clinically.  Already open without any continued purulence.  Will start on oral antibiotics.  Warm compresses.  PCP follow-up.  9:33 PM:  I have discussed the diagnosis/risks/treatment options with the patient and believe the pt to be eligible for discharge home to follow-up with PCP. We also discussed returning to the ED immediately if new or worsening sx occur. We discussed the sx which are most concerning (e.g., sudden worsening pain, fever, inability to tolerate by mouth) that necessitate immediate return. Medications administered to the patient during their visit and any new prescriptions provided to the patient are listed below.  Medications given during this visit Medications  lidocaine-EPINEPHrine (XYLOCAINE W/EPI) 2 %-1:200000 (PF) injection 10 mL (has no administration in time range)     The patient appears reasonably screen and/or stabilized  for discharge and I doubt any other medical condition or other [facility] requiring further screening, evaluation, or treatment in the ED at this time prior to discharge.   Final Clinical Impression(s) / ED Diagnoses Final diagnoses:  Abscess    Rx / DC Orders ED Discharge Orders          Ordered    doxycycline (VIBRAMYCIN) 100 MG capsule  2 times daily        05/25/21 2127             Deno Etienne, DO 05/25/21 2133

## 2021-05-25 NOTE — ED Triage Notes (Signed)
C/o right arm abscess x 2 days HX MRSA

## 2021-05-25 NOTE — Discharge Instructions (Addendum)
Warm compresses 4x a day.  Return for rapid spreading redness or fever

## 2021-05-29 ENCOUNTER — Encounter (HOSPITAL_COMMUNITY): Payer: Self-pay | Admitting: Surgery

## 2021-05-29 NOTE — Progress Notes (Signed)
[name] medical history updated, reviewed instructions for 06/05/2021 procedure. Covid Swab test scheduled.

## 2021-05-30 ENCOUNTER — Telehealth: Payer: Self-pay | Admitting: Skilled Nursing Facility1

## 2021-05-30 NOTE — Telephone Encounter (Signed)
Dietitian called pt to assess their understanding of the pre-op nutrition recommendations through the teach back method to ensure the pts knowledge readiness in preparation for surgery.    LVM

## 2021-06-01 ENCOUNTER — Other Ambulatory Visit (HOSPITAL_COMMUNITY)
Admission: RE | Admit: 2021-06-01 | Discharge: 2021-06-01 | Disposition: A | Discharge status: Home / Self Care | Payer: 59 | Source: Ambulatory Visit | Attending: Surgery | Admitting: Surgery

## 2021-06-01 DIAGNOSIS — Z01812 Encounter for preprocedural laboratory examination: Secondary | ICD-10-CM | POA: Diagnosis present

## 2021-06-01 DIAGNOSIS — Z20822 Contact with and (suspected) exposure to covid-19: Secondary | ICD-10-CM | POA: Diagnosis not present

## 2021-06-01 LAB — SARS CORONAVIRUS 2 (TAT 6-24 HRS): SARS Coronavirus 2: NEGATIVE

## 2021-06-04 MED ORDER — BUPIVACAINE LIPOSOME 1.3 % IJ SUSP
20.0000 mL | Freq: Once | INTRAMUSCULAR | Status: DC
  Filled 2021-06-04: qty 20

## 2021-06-05 ENCOUNTER — Encounter (HOSPITAL_COMMUNITY)
Admission: RE | Disposition: A | Discharge status: Home / Self Care | Payer: Self-pay | Source: Home / Self Care | Attending: Surgery

## 2021-06-05 ENCOUNTER — Inpatient Hospital Stay (HOSPITAL_COMMUNITY)
Admission: RE | Admit: 2021-06-05 | Discharge: 2021-06-07 | DRG: 621 | Disposition: A | Discharge status: Home / Self Care | Payer: 59 | Attending: Surgery | Admitting: Surgery

## 2021-06-05 ENCOUNTER — Inpatient Hospital Stay (HOSPITAL_COMMUNITY): Payer: 59 | Admitting: Certified Registered Nurse Anesthetist

## 2021-06-05 ENCOUNTER — Encounter (HOSPITAL_COMMUNITY): Payer: Self-pay | Admitting: Surgery

## 2021-06-05 ENCOUNTER — Other Ambulatory Visit: Payer: Self-pay

## 2021-06-05 ENCOUNTER — Ambulatory Visit: Payer: 59

## 2021-06-05 DIAGNOSIS — E039 Hypothyroidism, unspecified: Secondary | ICD-10-CM | POA: Diagnosis present

## 2021-06-05 DIAGNOSIS — F17290 Nicotine dependence, other tobacco product, uncomplicated: Secondary | ICD-10-CM | POA: Diagnosis present

## 2021-06-05 DIAGNOSIS — Z8349 Family history of other endocrine, nutritional and metabolic diseases: Secondary | ICD-10-CM

## 2021-06-05 DIAGNOSIS — F909 Attention-deficit hyperactivity disorder, unspecified type: Secondary | ICD-10-CM | POA: Diagnosis present

## 2021-06-05 DIAGNOSIS — Z6841 Body Mass Index (BMI) 40.0 and over, adult: Secondary | ICD-10-CM | POA: Diagnosis not present

## 2021-06-05 DIAGNOSIS — Z8711 Personal history of peptic ulcer disease: Secondary | ICD-10-CM | POA: Diagnosis not present

## 2021-06-05 DIAGNOSIS — K219 Gastro-esophageal reflux disease without esophagitis: Secondary | ICD-10-CM | POA: Diagnosis present

## 2021-06-05 DIAGNOSIS — Z8249 Family history of ischemic heart disease and other diseases of the circulatory system: Secondary | ICD-10-CM | POA: Diagnosis not present

## 2021-06-05 DIAGNOSIS — Z832 Family history of diseases of the blood and blood-forming organs and certain disorders involving the immune mechanism: Secondary | ICD-10-CM

## 2021-06-05 DIAGNOSIS — G4733 Obstructive sleep apnea (adult) (pediatric): Secondary | ICD-10-CM | POA: Diagnosis present

## 2021-06-05 DIAGNOSIS — Z8616 Personal history of COVID-19: Secondary | ICD-10-CM

## 2021-06-05 DIAGNOSIS — K760 Fatty (change of) liver, not elsewhere classified: Secondary | ICD-10-CM | POA: Diagnosis present

## 2021-06-05 DIAGNOSIS — Z8614 Personal history of Methicillin resistant Staphylococcus aureus infection: Secondary | ICD-10-CM

## 2021-06-05 DIAGNOSIS — E785 Hyperlipidemia, unspecified: Secondary | ICD-10-CM | POA: Diagnosis present

## 2021-06-05 DIAGNOSIS — Z833 Family history of diabetes mellitus: Secondary | ICD-10-CM

## 2021-06-05 DIAGNOSIS — K581 Irritable bowel syndrome with constipation: Secondary | ICD-10-CM | POA: Diagnosis present

## 2021-06-05 HISTORY — PX: GASTRIC ROUX-EN-Y: SHX5262

## 2021-06-05 HISTORY — DX: Cutaneous abscess, unspecified: L02.91

## 2021-06-05 LAB — HEMOGLOBIN A1C
Hgb A1c MFr Bld: 5.2 % (ref 4.8–5.6)
Mean Plasma Glucose: 102.54 mg/dL

## 2021-06-05 LAB — GLUCOSE, CAPILLARY
Glucose-Capillary: 116 mg/dL — ABNORMAL HIGH (ref 70–99)
Glucose-Capillary: 125 mg/dL — ABNORMAL HIGH (ref 70–99)
Glucose-Capillary: 130 mg/dL — ABNORMAL HIGH (ref 70–99)
Glucose-Capillary: 131 mg/dL — ABNORMAL HIGH (ref 70–99)

## 2021-06-05 LAB — TYPE AND SCREEN
ABO/RH(D): A POS
Antibody Screen: NEGATIVE

## 2021-06-05 SURGERY — LAPAROSCOPIC ROUX-EN-Y GASTRIC BYPASS WITH UPPER ENDOSCOPY
Anesthesia: General | Site: Abdomen

## 2021-06-05 MED ORDER — ACETAMINOPHEN 500 MG PO TABS
1000.0000 mg | ORAL_TABLET | ORAL | Status: DC
  Filled 2021-06-05: qty 2

## 2021-06-05 MED ORDER — ONDANSETRON HCL 4 MG/2ML IJ SOLN
INTRAMUSCULAR | Status: DC | PRN
  Administered 2021-06-05: 4 mg via INTRAVENOUS

## 2021-06-05 MED ORDER — LIDOCAINE 2% (20 MG/ML) 5 ML SYRINGE
INTRAMUSCULAR | Status: DC | PRN
  Administered 2021-06-05: 60 mg via INTRAVENOUS

## 2021-06-05 MED ORDER — BUPIVACAINE-EPINEPHRINE (PF) 0.25% -1:200000 IJ SOLN
INTRAMUSCULAR | Status: AC
  Filled 2021-06-05: qty 30

## 2021-06-05 MED ORDER — MIDAZOLAM HCL 5 MG/5ML IJ SOLN
INTRAMUSCULAR | Status: DC | PRN
  Administered 2021-06-05: 2 mg via INTRAVENOUS

## 2021-06-05 MED ORDER — SUCCINYLCHOLINE CHLORIDE 200 MG/10ML IV SOSY
PREFILLED_SYRINGE | INTRAVENOUS | Status: DC | PRN
  Administered 2021-06-05: 200 mg via INTRAVENOUS

## 2021-06-05 MED ORDER — GABAPENTIN 300 MG PO CAPS
300.0000 mg | ORAL_CAPSULE | Freq: Three times a day (TID) | ORAL | Status: DC
  Administered 2021-06-05 – 2021-06-07 (×6): 300 mg via ORAL
  Filled 2021-06-05 (×6): qty 1

## 2021-06-05 MED ORDER — FENTANYL CITRATE (PF) 100 MCG/2ML IJ SOLN
INTRAMUSCULAR | Status: AC
  Filled 2021-06-05: qty 2

## 2021-06-05 MED ORDER — KETAMINE HCL 10 MG/ML IJ SOLN
INTRAMUSCULAR | Status: DC | PRN
  Administered 2021-06-05: 10 mg via INTRAVENOUS
  Administered 2021-06-05: 40 mg via INTRAVENOUS

## 2021-06-05 MED ORDER — SCOPOLAMINE 1 MG/3DAYS TD PT72
1.0000 | MEDICATED_PATCH | TRANSDERMAL | Status: DC
  Administered 2021-06-05: 1.5 mg via TRANSDERMAL
  Filled 2021-06-05: qty 1

## 2021-06-05 MED ORDER — CEFAZOLIN IN SODIUM CHLORIDE 3-0.9 GM/100ML-% IV SOLN
INTRAVENOUS | Status: AC
  Filled 2021-06-05: qty 100

## 2021-06-05 MED ORDER — BUPIVACAINE-EPINEPHRINE 0.25% -1:200000 IJ SOLN
INTRAMUSCULAR | Status: DC | PRN
  Administered 2021-06-05: 30 mL

## 2021-06-05 MED ORDER — APREPITANT 40 MG PO CAPS
40.0000 mg | ORAL_CAPSULE | ORAL | Status: AC
  Administered 2021-06-05: 40 mg via ORAL
  Filled 2021-06-05: qty 1

## 2021-06-05 MED ORDER — GABAPENTIN 300 MG PO CAPS
300.0000 mg | ORAL_CAPSULE | ORAL | Status: AC
  Administered 2021-06-05: 300 mg via ORAL
  Filled 2021-06-05: qty 1

## 2021-06-05 MED ORDER — LACTATED RINGERS IR SOLN
Status: DC | PRN
  Administered 2021-06-05: 1000 mL

## 2021-06-05 MED ORDER — SODIUM CHLORIDE 0.9 % IV SOLN: INTRAVENOUS | Status: DC

## 2021-06-05 MED ORDER — LACTATED RINGERS IV SOLN: INTRAVENOUS | Status: DC

## 2021-06-05 MED ORDER — FENTANYL CITRATE (PF) 100 MCG/2ML IJ SOLN
INTRAMUSCULAR | Status: DC | PRN
  Administered 2021-06-05 (×2): 50 ug via INTRAVENOUS
  Administered 2021-06-05: 100 ug via INTRAVENOUS
  Administered 2021-06-05 (×2): 50 ug via INTRAVENOUS

## 2021-06-05 MED ORDER — ACETAMINOPHEN 160 MG/5ML PO SOLN: 1000.0000 mg | Freq: Three times a day (TID) | ORAL | Status: DC

## 2021-06-05 MED ORDER — PANTOPRAZOLE SODIUM 40 MG IV SOLR
40.0000 mg | Freq: Every day | INTRAVENOUS | Status: DC
  Administered 2021-06-05 – 2021-06-06 (×2): 40 mg via INTRAVENOUS
  Filled 2021-06-05 (×2): qty 40

## 2021-06-05 MED ORDER — CHLORHEXIDINE GLUCONATE 4 % EX LIQD: 60.0000 mL | Freq: Once | CUTANEOUS | Status: DC

## 2021-06-05 MED ORDER — FENTANYL CITRATE (PF) 100 MCG/2ML IJ SOLN
25.0000 ug | INTRAMUSCULAR | Status: DC | PRN
  Administered 2021-06-05: 50 ug via INTRAVENOUS

## 2021-06-05 MED ORDER — ENOXAPARIN (LOVENOX) PATIENT EDUCATION KIT
PACK | Freq: Once | Status: AC
  Filled 2021-06-05: qty 1

## 2021-06-05 MED ORDER — ACETAMINOPHEN 500 MG PO TABS: 1000.0000 mg | ORAL_TABLET | Freq: Three times a day (TID) | ORAL | Status: DC

## 2021-06-05 MED ORDER — HEPARIN SODIUM (PORCINE) 5000 UNIT/ML IJ SOLN
5000.0000 [IU] | INTRAMUSCULAR | Status: AC
  Administered 2021-06-05: 5000 [IU] via SUBCUTANEOUS
  Filled 2021-06-05: qty 1

## 2021-06-05 MED ORDER — "VISTASEAL 4 ML SINGLE DOSE KIT "
4.0000 mL | PACK | Freq: Once | CUTANEOUS | Status: AC
  Administered 2021-06-05: 4 mL via TOPICAL
  Filled 2021-06-05: qty 4

## 2021-06-05 MED ORDER — METHOCARBAMOL 500 MG IVPB - SIMPLE MED
500.0000 mg | Freq: Four times a day (QID) | INTRAVENOUS | Status: DC | PRN
  Filled 2021-06-05: qty 50

## 2021-06-05 MED ORDER — KETOROLAC TROMETHAMINE 15 MG/ML IJ SOLN
15.0000 mg | Freq: Once | INTRAMUSCULAR | Status: AC
  Administered 2021-06-05: 15 mg via INTRAVENOUS
  Filled 2021-06-05: qty 1

## 2021-06-05 MED ORDER — TRAMADOL HCL 50 MG PO TABS
50.0000 mg | ORAL_TABLET | Freq: Four times a day (QID) | ORAL | Status: DC | PRN
  Filled 2021-06-05: qty 1

## 2021-06-05 MED ORDER — PROMETHAZINE HCL 25 MG/ML IJ SOLN
INTRAMUSCULAR | Status: AC
  Filled 2021-06-05: qty 1

## 2021-06-05 MED ORDER — LEVOTHYROXINE SODIUM 75 MCG PO TABS
75.0000 ug | ORAL_TABLET | Freq: Every day | ORAL | Status: DC
  Administered 2021-06-06 – 2021-06-07 (×2): 75 ug via ORAL
  Filled 2021-06-05 (×2): qty 1

## 2021-06-05 MED ORDER — DEXAMETHASONE SODIUM PHOSPHATE 10 MG/ML IJ SOLN
INTRAMUSCULAR | Status: DC | PRN
  Administered 2021-06-05: 7 mg via INTRAVENOUS

## 2021-06-05 MED ORDER — DOCUSATE SODIUM 100 MG PO CAPS
100.0000 mg | ORAL_CAPSULE | Freq: Two times a day (BID) | ORAL | Status: DC
  Administered 2021-06-05 – 2021-06-07 (×5): 100 mg via ORAL
  Filled 2021-06-05 (×6): qty 1

## 2021-06-05 MED ORDER — LAMOTRIGINE 25 MG PO TABS
50.0000 mg | ORAL_TABLET | Freq: Two times a day (BID) | ORAL | Status: DC
  Administered 2021-06-05 – 2021-06-07 (×4): 50 mg via ORAL
  Filled 2021-06-05 (×5): qty 2

## 2021-06-05 MED ORDER — SUGAMMADEX SODIUM 500 MG/5ML IV SOLN
INTRAVENOUS | Status: AC
  Filled 2021-06-05: qty 5

## 2021-06-05 MED ORDER — PROPOFOL 10 MG/ML IV BOLUS
INTRAVENOUS | Status: DC | PRN
  Administered 2021-06-05: 200 mg via INTRAVENOUS

## 2021-06-05 MED ORDER — PROMETHAZINE HCL 25 MG/ML IJ SOLN
6.2500 mg | INTRAMUSCULAR | Status: DC | PRN
  Administered 2021-06-05: 6.25 mg via INTRAVENOUS

## 2021-06-05 MED ORDER — METOCLOPRAMIDE HCL 5 MG/ML IJ SOLN
10.0000 mg | Freq: Four times a day (QID) | INTRAMUSCULAR | Status: DC
Start: 2021-06-05 — End: 2021-06-07
  Administered 2021-06-05 – 2021-06-07 (×7): 10 mg via INTRAVENOUS
  Filled 2021-06-05 (×7): qty 2

## 2021-06-05 MED ORDER — ROCURONIUM BROMIDE 10 MG/ML (PF) SYRINGE
PREFILLED_SYRINGE | INTRAVENOUS | Status: DC | PRN
  Administered 2021-06-05: 20 mg via INTRAVENOUS
  Administered 2021-06-05: 60 mg via INTRAVENOUS
  Administered 2021-06-05: 20 mg via INTRAVENOUS
  Administered 2021-06-05: 10 mg via INTRAVENOUS
  Administered 2021-06-05 (×3): 20 mg via INTRAVENOUS

## 2021-06-05 MED ORDER — METOPROLOL TARTRATE 5 MG/5ML IV SOLN: 5.0000 mg | Freq: Four times a day (QID) | INTRAVENOUS | Status: DC | PRN

## 2021-06-05 MED ORDER — BUPIVACAINE LIPOSOME 1.3 % IJ SUSP
INTRAMUSCULAR | Status: DC | PRN
  Administered 2021-06-05: 20 mL

## 2021-06-05 MED ORDER — SIMETHICONE 80 MG PO CHEW
80.0000 mg | CHEWABLE_TABLET | Freq: Four times a day (QID) | ORAL | Status: DC | PRN
  Administered 2021-06-05 – 2021-06-06 (×3): 80 mg via ORAL
  Filled 2021-06-05 (×3): qty 1

## 2021-06-05 MED ORDER — FIBRIN SEALANT 2 ML SINGLE DOSE KIT
2.0000 mL | PACK | Freq: Once | CUTANEOUS | Status: AC
  Administered 2021-06-05: 2 mL via TOPICAL
  Filled 2021-06-05: qty 2

## 2021-06-05 MED ORDER — LIDOCAINE 20MG/ML (2%) 15 ML SYRINGE OPTIME
INTRAMUSCULAR | Status: DC | PRN
  Administered 2021-06-05: 1 mg/kg/h via INTRAVENOUS

## 2021-06-05 MED ORDER — ONDANSETRON HCL 4 MG/2ML IJ SOLN
INTRAMUSCULAR | Status: AC
  Filled 2021-06-05: qty 2

## 2021-06-05 MED ORDER — INSULIN ASPART 100 UNIT/ML IJ SOLN
0.0000 [IU] | INTRAMUSCULAR | Status: DC
  Administered 2021-06-05 (×3): 3 [IU] via SUBCUTANEOUS

## 2021-06-05 MED ORDER — PHENYLEPHRINE HCL-NACL 10-0.9 MG/250ML-% IV SOLN
INTRAVENOUS | Status: AC
  Filled 2021-06-05: qty 500

## 2021-06-05 MED ORDER — SUGAMMADEX SODIUM 500 MG/5ML IV SOLN
INTRAVENOUS | Status: DC | PRN
  Administered 2021-06-05: 500 mg via INTRAVENOUS

## 2021-06-05 MED ORDER — SUCCINYLCHOLINE CHLORIDE 200 MG/10ML IV SOSY
PREFILLED_SYRINGE | INTRAVENOUS | Status: AC
  Filled 2021-06-05: qty 10

## 2021-06-05 MED ORDER — HYDROMORPHONE HCL 1 MG/ML IJ SOLN
0.5000 mg | INTRAMUSCULAR | Status: DC | PRN
  Administered 2021-06-05: 0.5 mg via INTRAVENOUS
  Filled 2021-06-05: qty 0.5

## 2021-06-05 MED ORDER — HYDRALAZINE HCL 20 MG/ML IJ SOLN: 10.0000 mg | INTRAMUSCULAR | Status: DC | PRN

## 2021-06-05 MED ORDER — MIDAZOLAM HCL 2 MG/2ML IJ SOLN
INTRAMUSCULAR | Status: AC
  Filled 2021-06-05: qty 2

## 2021-06-05 MED ORDER — ONDANSETRON HCL 4 MG/2ML IJ SOLN
4.0000 mg | INTRAMUSCULAR | Status: DC | PRN
  Administered 2021-06-05: 4 mg via INTRAVENOUS
  Filled 2021-06-05: qty 2

## 2021-06-05 MED ORDER — SODIUM CHLORIDE 0.9 % IV SOLN
2.0000 g | Freq: Two times a day (BID) | INTRAVENOUS | Status: DC
  Administered 2021-06-05: 2 g via INTRAVENOUS
  Filled 2021-06-05 (×2): qty 2

## 2021-06-05 MED ORDER — ROCURONIUM BROMIDE 10 MG/ML (PF) SYRINGE
PREFILLED_SYRINGE | INTRAVENOUS | Status: AC
  Filled 2021-06-05: qty 10

## 2021-06-05 MED ORDER — PROPOFOL 10 MG/ML IV BOLUS
INTRAVENOUS | Status: AC
  Filled 2021-06-05: qty 40

## 2021-06-05 MED ORDER — ORAL CARE MOUTH RINSE: 15.0000 mL | Freq: Once | OROMUCOSAL | Status: AC

## 2021-06-05 MED ORDER — OXYCODONE HCL 5 MG/5ML PO SOLN
5.0000 mg | Freq: Four times a day (QID) | ORAL | Status: DC | PRN
  Administered 2021-06-05 – 2021-06-07 (×7): 5 mg via ORAL
  Filled 2021-06-05 (×7): qty 5

## 2021-06-05 MED ORDER — ENOXAPARIN SODIUM 30 MG/0.3ML IJ SOSY
30.0000 mg | PREFILLED_SYRINGE | Freq: Two times a day (BID) | INTRAMUSCULAR | Status: DC
  Administered 2021-06-05 – 2021-06-07 (×4): 30 mg via SUBCUTANEOUS
  Filled 2021-06-05 (×4): qty 0.3

## 2021-06-05 MED ORDER — POLYVINYL ALCOHOL 1.4 % OP SOLN
1.0000 [drp] | OPHTHALMIC | Status: DC | PRN
  Administered 2021-06-05: 1 [drp] via OPHTHALMIC
  Filled 2021-06-05 (×2): qty 15

## 2021-06-05 MED ORDER — STERILE WATER FOR IRRIGATION IR SOLN
Status: DC | PRN
  Administered 2021-06-05: 1000 mL

## 2021-06-05 MED ORDER — 0.9 % SODIUM CHLORIDE (POUR BTL) OPTIME
TOPICAL | Status: DC | PRN
  Administered 2021-06-05: 1000 mL

## 2021-06-05 MED ORDER — DEXAMETHASONE SODIUM PHOSPHATE 10 MG/ML IJ SOLN
INTRAMUSCULAR | Status: AC
  Filled 2021-06-05: qty 1

## 2021-06-05 MED ORDER — KETAMINE HCL 10 MG/ML IJ SOLN
INTRAMUSCULAR | Status: AC
  Filled 2021-06-05: qty 1

## 2021-06-05 MED ORDER — ENSURE MAX PROTEIN PO LIQD
2.0000 [oz_av] | ORAL | Status: DC
  Administered 2021-06-06 – 2021-06-07 (×8): 2 [oz_av] via ORAL

## 2021-06-05 MED ORDER — METHOCARBAMOL 1000 MG/10ML IJ SOLN
500.0000 mg | Freq: Four times a day (QID) | INTRAVENOUS | Status: DC
  Administered 2021-06-05 – 2021-06-06 (×4): 500 mg via INTRAVENOUS
  Filled 2021-06-05: qty 500
  Filled 2021-06-05: qty 5
  Filled 2021-06-05 (×3): qty 500
  Filled 2021-06-05: qty 5

## 2021-06-05 MED ORDER — CHLORHEXIDINE GLUCONATE 0.12 % MT SOLN
15.0000 mL | Freq: Once | OROMUCOSAL | Status: AC
  Administered 2021-06-05: 15 mL via OROMUCOSAL

## 2021-06-05 SURGICAL SUPPLY — 83 items
APL LAPSCP 35 DL APL RGD (MISCELLANEOUS) ×1
APL PRP STRL LF DISP 70% ISPRP (MISCELLANEOUS) ×2
APL SKNCLS STERI-STRIP NONHPOA (GAUZE/BANDAGES/DRESSINGS) ×1
APL SWBSTK 6 STRL LF DISP (MISCELLANEOUS)
APPLICATOR COTTON TIP 6 STRL (MISCELLANEOUS) IMPLANT
APPLICATOR COTTON TIP 6IN STRL (MISCELLANEOUS)
APPLICATOR VISTASEAL 35 (MISCELLANEOUS) ×2 IMPLANT
APPLIER CLIP ROT 13.4 12 LRG (CLIP)
APR CLP LRG 13.4X12 ROT 20 MLT (CLIP)
BAG COUNTER SPONGE SURGICOUNT (BAG) IMPLANT
BAG SPNG CNTER NS LX DISP (BAG)
BENZOIN TINCTURE PRP APPL 2/3 (GAUZE/BANDAGES/DRESSINGS) ×2 IMPLANT
BLADE SURG SZ11 CARB STEEL (BLADE) ×2 IMPLANT
BNDG ADH 1X3 SHEER STRL LF (GAUZE/BANDAGES/DRESSINGS) ×7 IMPLANT
BNDG ADH THN 3X1 STRL LF (GAUZE/BANDAGES/DRESSINGS) ×1
CABLE HIGH FREQUENCY MONO STRZ (ELECTRODE) IMPLANT
CHLORAPREP W/TINT 26 (MISCELLANEOUS) ×4 IMPLANT
CLIP APPLIE ROT 13.4 12 LRG (CLIP) IMPLANT
CLIP SUT LAPRA TY ABSORB (SUTURE) ×4 IMPLANT
COVER SURGICAL LIGHT HANDLE (MISCELLANEOUS) ×2 IMPLANT
DEVICE SUT QUICK LOAD TK 5 (STAPLE) IMPLANT
DEVICE SUT TI-KNOT TK 5X26 (MISCELLANEOUS) IMPLANT
DEVICE SUTURE ENDOST 10MM (ENDOMECHANICALS) ×2 IMPLANT
DRAIN PENROSE 0.25X18 (DRAIN) ×2 IMPLANT
ELECT REM PT RETURN 15FT ADLT (MISCELLANEOUS) ×2 IMPLANT
GAUZE 4X4 16PLY ~~LOC~~+RFID DBL (SPONGE) ×2 IMPLANT
GAUZE SPONGE 4X4 12PLY STRL (GAUZE/BANDAGES/DRESSINGS) IMPLANT
GLOVE SURG ENC MOIS LTX SZ6 (GLOVE) ×2 IMPLANT
GLOVE SURG MICRO LTX SZ6 (GLOVE) ×2 IMPLANT
GLOVE SURG UNDER LTX SZ6.5 (GLOVE) ×2 IMPLANT
GOWN STRL REUS W/TWL LRG LVL3 (GOWN DISPOSABLE) ×2 IMPLANT
GOWN STRL REUS W/TWL XL LVL3 (GOWN DISPOSABLE) ×6 IMPLANT
KIT BASIN OR (CUSTOM PROCEDURE TRAY) ×2 IMPLANT
KIT GASTRIC LAVAGE 34FR ADT (SET/KITS/TRAYS/PACK) ×2 IMPLANT
KIT TURNOVER KIT A (KITS) ×2 IMPLANT
MARKER SKIN DUAL TIP RULER LAB (MISCELLANEOUS) ×2 IMPLANT
MAT PREVALON FULL STRYKER (MISCELLANEOUS) ×2 IMPLANT
NDL SPNL 22GX3.5 QUINCKE BK (NEEDLE) ×1 IMPLANT
NEEDLE SPNL 22GX3.5 QUINCKE BK (NEEDLE) ×2 IMPLANT
PACK CARDIOVASCULAR III (CUSTOM PROCEDURE TRAY) ×2 IMPLANT
PENCIL SMOKE EVACUATOR (MISCELLANEOUS) IMPLANT
RELOAD ENDO STITCH 2.0 (ENDOMECHANICALS) ×16
RELOAD STAPLE 60 2.6 WHT THN (STAPLE) ×2 IMPLANT
RELOAD STAPLE 60 3.6 BLU REG (STAPLE) ×2 IMPLANT
RELOAD STAPLE 60 3.8 GOLD REG (STAPLE) IMPLANT
RELOAD STAPLE 60 4.1 GRN THCK (STAPLE) ×1 IMPLANT
RELOAD STAPLER BLUE 60MM (STAPLE) ×5 IMPLANT
RELOAD STAPLER GOLD 60MM (STAPLE) ×1 IMPLANT
RELOAD STAPLER GREEN 60MM (STAPLE) ×1 IMPLANT
RELOAD STAPLER WHITE 60MM (STAPLE) ×2 IMPLANT
RELOAD SUT SNGL STCH ABSRB 2-0 (ENDOMECHANICALS) ×4 IMPLANT
RELOAD SUT SNGL STCH BLK 2-0 (ENDOMECHANICALS) ×4 IMPLANT
SCISSORS LAP 5X45 EPIX DISP (ENDOMECHANICALS) ×2 IMPLANT
SET IRRIG TUBING LAPAROSCOPIC (IRRIGATION / IRRIGATOR) ×2 IMPLANT
SET TUBE SMOKE EVAC HIGH FLOW (TUBING) ×2 IMPLANT
SHEARS HARMONIC ACE PLUS 45CM (MISCELLANEOUS) ×2 IMPLANT
SLEEVE XCEL OPT CAN 5 100 (ENDOMECHANICALS) ×4 IMPLANT
SOL ANTI FOG 6CC (MISCELLANEOUS) ×1 IMPLANT
SOLUTION ANTI FOG 6CC (MISCELLANEOUS) ×1
STAPLER ECHELON BIOABSB 60 FLE (MISCELLANEOUS) IMPLANT
STAPLER ECHELON LONG 60 440 (INSTRUMENTS) ×2 IMPLANT
STAPLER RELOAD BLUE 60MM (STAPLE) ×10
STAPLER RELOAD GOLD 60MM (STAPLE) ×2
STAPLER RELOAD GREEN 60MM (STAPLE) ×2
STAPLER RELOAD WHITE 60MM (STAPLE) ×4
STRIP CLOSURE SKIN 1/2X4 (GAUZE/BANDAGES/DRESSINGS) ×2 IMPLANT
SUT MNCRL AB 4-0 PS2 18 (SUTURE) ×2 IMPLANT
SUT RELOAD ENDO STITCH 2 48X1 (ENDOMECHANICALS) ×4
SUT RELOAD ENDO STITCH 2.0 (ENDOMECHANICALS) ×4
SUT SURGIDAC NAB ES-9 0 48 120 (SUTURE) IMPLANT
SUT VIC AB 2-0 SH 27 (SUTURE) ×2
SUT VIC AB 2-0 SH 27X BRD (SUTURE) ×1 IMPLANT
SUTURE RELOAD END STTCH 2 48X1 (ENDOMECHANICALS) ×4 IMPLANT
SUTURE RELOAD ENDO STITCH 2.0 (ENDOMECHANICALS) ×4 IMPLANT
SYR 10ML ECCENTRIC (SYRINGE) ×2 IMPLANT
SYR 20ML LL LF (SYRINGE) ×4 IMPLANT
TOWEL OR 17X26 10 PK STRL BLUE (TOWEL DISPOSABLE) ×2 IMPLANT
TOWEL OR NON WOVEN STRL DISP B (DISPOSABLE) ×2 IMPLANT
TRAY FOLEY MTR SLVR 16FR STAT (SET/KITS/TRAYS/PACK) ×2 IMPLANT
TROCAR BLADELESS OPT 5 100 (ENDOMECHANICALS) ×2 IMPLANT
TROCAR UNIVERSAL OPT 12M 100M (ENDOMECHANICALS) ×2 IMPLANT
TROCAR XCEL 12X100 BLDLESS (ENDOMECHANICALS) ×2 IMPLANT
TUBING CONNECTING 10 (TUBING) IMPLANT

## 2021-06-05 NOTE — Anesthesia Procedure Notes (Addendum)
Procedure Name: Intubation Date/Time: 06/05/2021 7:35 AM Performed by: West Pugh, CRNA Pre-anesthesia Checklist: Patient identified, Emergency Drugs available, Suction available, Patient being monitored and Timeout performed Patient Re-evaluated:Patient Re-evaluated prior to induction Oxygen Delivery Method: Circle system utilized Preoxygenation: Pre-oxygenation with 100% oxygen Induction Type: IV induction, Rapid sequence and Cricoid Pressure applied Laryngoscope Size: Glidescope and 4 Grade View: Grade I Tube type: Oral Tube size: 7.5 mm Number of attempts: 1 Airway Equipment and Method: Rigid stylet and Video-laryngoscopy Placement Confirmation: ETT inserted through vocal cords under direct vision, positive ETCO2, CO2 detector and breath sounds checked- equal and bilateral Secured at: 22 cm Tube secured with: Tape Dental Injury: Teeth and Oropharynx as per pre-operative assessment  Difficulty Due To: Difficulty was anticipated and Difficult Airway- due to large tongue

## 2021-06-05 NOTE — Anesthesia Preprocedure Evaluation (Addendum)
Anesthesia Evaluation  Patient identified by MRN, date of birth, ID band Patient awake    Reviewed: Allergy & Precautions, NPO status , Patient's Chart, lab work & pertinent test results  Airway Mallampati: I  TM Distance: >3 FB Neck ROM: Full    Dental  (+) Missing, Dental Advisory Given,    Pulmonary sleep apnea and Continuous Positive Airway Pressure Ventilation ,    Pulmonary exam normal breath sounds clear to auscultation       Cardiovascular negative cardio ROS Normal cardiovascular exam Rhythm:Regular Rate:Normal     Neuro/Psych PSYCHIATRIC DISORDERS Anxiety Depression Bipolar Disorder negative neurological ROS     GI/Hepatic Neg liver ROS, hiatal hernia, PUD, GERD  Medicated and Poorly Controlled,  Endo/Other  Hypothyroidism Morbid obesity (BMI 59)  Renal/GU negative Renal ROS  negative genitourinary   Musculoskeletal  (+) Arthritis ,   Abdominal   Peds  Hematology negative hematology ROS (+)   Anesthesia Other Findings   Reproductive/Obstetrics                           Anesthesia Physical Anesthesia Plan  ASA: 3  Anesthesia Plan: General   Post-op Pain Management:    Induction: Intravenous and Rapid sequence  PONV Risk Score and Plan: 2 and Midazolam, Dexamethasone and Ondansetron  Airway Management Planned: Oral ETT  Additional Equipment:   Intra-op Plan:   Post-operative Plan: Extubation in OR  Informed Consent: I have reviewed the patients History and Physical, chart, labs and discussed the procedure including the risks, benefits and alternatives for the proposed anesthesia with the patient or authorized representative who has indicated his/her understanding and acceptance.     Dental advisory given  Plan Discussed with: CRNA  Anesthesia Plan Comments:        Anesthesia Quick Evaluation

## 2021-06-05 NOTE — Op Note (Signed)
Operative Note  [name]  [id number]  [id number]  06/05/2021   Surgeon: Clovis Riley MD   Assistant: Kaylyn Lim MD   Procedure performed: laparoscopic Roux-en-Y gastric bypass (antecolic, antegastric) , upper endoscopy   Preop diagnosis: Morbid obesity Body mass index is 58.92 kg/m. Post-op diagnosis/intraop findings: same   Specimens: none Retained items: none  EBL: minimal cc Complications: none   Description of procedure: After obtaining informed consent and administration of prophylactic heparin in holding, the patient was taken to the operating room and placed supine on operating room table where general endotracheal anesthesia was initiated, preoperative antibiotics were administered, SCDs applied, and a formal timeout was performed. The abdomen was prepped and draped in usual sterile fashion. Peritoneal access was gained using a Visiport technique in the left upper quadrant and insufflation to 15 mmHg ensued without issue. Gross inspection revealed no evidence of injury. Under direct visualization the remaining trochars were inserted. A laparoscopic assisted bilateral taps block was performed using Exparel mixed with Marcaine. The omentum was reflected cephalad and the ligament of Treitz identified. The small bowel was followed to a point 50 cm distal to ligament of Treitz at which location the bowel was divided with a white load linear cutting stapler. A Penrose was sutured to the Roux side of the staple line for future identification. The bowel was measured another 100 cm distal to this and and the site for the jejunojejunostomy was aligned with the end of the biliopancreatic limb. Enterotomies were made with the Harmonic scalpel and the anastomosis was created with the 60 mm white load linear cutting stapler. The common enterotomy was closed with running 3-0 Vicryl starting on either end and tying centrally. The mesenteric defect was closed with running silk suture  secured with Lapra-Ty's. The anastomosis was inspected and appeared widely patent, hemostatic with no gaps in the suture line. Vistaseal was injected over the anastomosis. We then divided the omentum using the harmonic scalpel. The patient was then placed in steep reverse Trendelenburg. The liver retractor was inserted through a subxiphoid incision and secured for fixed retraction of the left lobe. The Harmonic scalpel was used to enter the perigastric plane and the lesser sac at a point 5 cm distal to the GE junction on the lesser curve. The angle of His was gently bluntly dissected and the target shape of the pouch visualized to exclude any residual fundus. After confirming that all tubes have been removed from the stomach, the gastric pouch was created with serial fires of the linear cutting stapler. As we approached the angle of His, the Ewald tube was inserted to confirm no impingement on the GE junction. The Roux limb with its attached Penrose drain was then identified and brought up to meet the gastric pouch ensuring no twist in the small bowel mesentery. The staple line of the small bowel is directed to the patient's left side. A running 3-0 Vicryl was used to create a posterior suture line for our anastomosis between the gastric pouch and the small bowel. Gastrotomy and enterotomy was made with the Harmonic scalpel and a blue load linear cutting stapler was used to create a gastrojejunal anastomosis approximately 2.5cm wide. The common enterotomy was closed with running 3-0 Vicryl starting at either end and tying centrally. At this juncture the Ewald tube was passed through the gastrojejunal anastomosis. An anterior layer of running 3-0 Vicryl was used to complete the gastrojejunal anastomosis. The ewald tube was removed without difficulty. The [place] space was  closed with a figure-of-eight silk suture. At this point the assistant performed an upper endoscopy with the Roux limb gently clamped with a bowel  clamp. Irrigation is instilled in the upper abdomen for a leak test. Please see his separate operative note- the anastomosis is noted to be patent and hemostatic without any leak or bubbles present. The endoscope was removed and the abdomen once again surveyed. Additional vistaseal was sprayed over the gastrojejunostomy. The liver retractor was removed under direct visualization. The abdomen was then desufflated and all remaining trochars removed. The skin incisions were closed with subcuticular 4-0 Monocryl; benzoin, Steri-Strips and Band-Aids were applied The patient was then awakened, extubated and taken to PACU in stable condition.     All counts were correct at the completion of the case.

## 2021-06-05 NOTE — Discharge Instructions (Signed)

## 2021-06-05 NOTE — Progress Notes (Signed)

## 2021-06-05 NOTE — H&P (Signed)
Surgical Evaluation   Chief Complaint: GERD   HPI: returns for further consultation regarding surgical management of refractory GERD and morbid obesity.  He has completed the bariatric pathway with no barriers identified. He started the preoperative diet a couple days ago.  He has had some weight regain since our last visit, in part due to the recent unexpected loss of his mother At the age of 19 from acute liver failure.  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  He feels that he is handling these losses appropriately at this point  is still eager to proceed with gastric bypass.[name] Kitchen   UGI/CXR: negative. No HH, no reflux noted Dietician: approved 01/09/21 [name] [name]) Psych: approved 01/22/21 [name]) LabsL nicotine and cotinine negative 2 (March, May). Remaining labs unremarkable   Initial visit 12/13/20: Very pleasant 35 year old gentleman with multiple issues referred for evaluation for surgical treatment of refractory GERD and morbid obesity.  His gastroenterologist is Dr. Bryan Lemma.  He has been struggling with GI issues for at least the last 15 years.  He endorses daily heartburn, sour brash and malodorous belching.  The heartburn is throughout the day, it is worse with eating.  It is somewhat better at night as he does avoid provoking foods although he will occasionally wake up in the early morning with heartburn which he treats with drinking milk and then is able to go back to sleep.  His symptoms are fairly controlled with daily Protonix 40 mg twice a day but he continues to have breakthrough symptoms and does note significant worsening when he stops or decreases the proton pump inhibitor. He also has irritable bowel syndrome which previously was characterized mostly by diarrhea but now more so by constipation, he notes intermittent spells of severe crampy mid and lower abdominal pain which are somewhat improved with increase in dietary fiber and Bentyl.  He  has had a negative colonoscopy with random rectal biopsies. He follows with [name] at the current healthy weight and wellness clinic beginning September 2021 and has lost about 16 pounds through diet modification and counseling there.  He has cut back on soda significantly. He is interested in bariatric surgery primarily for treatment of his significant reflux issues but additionally to endorse weight loss and treat his hepatic steatosis and insulin resistance.     PMH: Tobacco use, objective sleep apnea on CPAP, Epic chart lists history of schizophrenia, depression, bipolar disorder, anxiety, ADHD, chronic back pain, irritable bowel syndrome, hypothyroid, hyperlipidemia, hepatitis steatosis, constipation, asthma, 15 year history of GERD with 1 previous episode of GI bleeding.  This was around 2010 and on endoscopy he did have reflux esophagitis with erosions noted.  Insulin resistance recently starter on metformin   Denies any prior abdominal surgical history.  He has had numerous upper and lower endoscopies.     Covid + 11/23/20 Labs done August 02, 2020: CMP, lipid panel, TSH T3 and T4 all unremarkable; hemoglobin A1c was 5.5, vitamin D 25-hydroxy was slightly low, H pylori negative biopsies from upper endoscopy on June 07, 2020.  On the scope he did have an irregular Z line, pathology showed reflux changes, no Barrett's, mild gastritis and benign gastric polyps were noted.  He had a probable pH study at that time which noted good acid suppression on PPI with a DeMeester score of 2.7, no symptom correlation between symptoms and reflux events and the overall number of reflux episodes were normal. CT scan March 22, 2020 notable for hepatitis steatosis and mild hepatomegaly Right upper quadrant ultrasound October 2020 negative for gallstones or sludge     Social Hx: he is a International aid/development worker logs; his schedule can be flexible. He does dip snuff but has been cutting back on this with  aim of quitting.  He is married with 2 children.   Family history notable for seizures, IBS, depression and anxiety and thyroid disease in his mother, colon polyps, clotting disorder, heart disease, cancer, depression, sleep apnea, obesity and leukemia in his father, brothers with bipolar disorder and ADHD and one paternal aunt with ulcerative colitis.        Allergies  Allergen Reactions   Ibuprofen Other (See Comments)      Makes ulcers bleed   Influenza Vaccines Shortness Of Breath and Other (See Comments)      Rash and unable to breathe well   Tylenol [Acetaminophen] Other (See Comments)      Bleeding ulcers   Bee Venom Swelling      SWELLING REACTION UNSPECIFIED   Tramadol Itching          Past Medical History:  Diagnosis Date   ADHD (attention deficit hyperactivity disorder)     Anal fissure     Anxiety     Asthma     Back pain     Bipolar disorder (HCC)     Chest pain     Chronic lower back pain     Constipation     Depression     Esophagitis      Distal esophageal erosions consistent with mild erosive  reflux esophagitis 12/2008 by EGD   External hemorrhoids     Gastric polyps     Gastric ulcer     GERD (gastroesophageal reflux disease)     Hepatic steatosis     Hiatal hernia     History of stomach ulcers     Hx MRSA infection 8/08    right thigh   Hx of colonic polyps     Hypercholesteremia      denies   Hypothyroidism     Internal hemorrhoids     Irritable bowel syndrome (IBS)     Obesity     Occult GI bleeding 12/2008    Trivial upper GI bleed/uncontrolled GERD by upper endoscopy 12/2008, normal f/u endoscopy 03/2009 with SBCE at that time   OSA on CPAP     Pain management     Pneumonia 09/01/2015    "on ATB still" (09/04/2015)   S/P colonoscopy 11/10, 10/08    Dr [name]   Schizophrenia [facility])     Sleep apnea     Stomach ulcer     Thyroid function test abnormal      Noted in 2011 discharge   Tobacco dipper     Wears contact lenses              Past Surgical History:  Procedure Laterality Date   ANKLE FRACTURE SURGERY Left ~ 2008   BACK SURGERY       BIOPSY   11/02/2019    Procedure: BIOPSY;  Surgeon: Lavena Bullion, DO;  Location: [facility];  Service: Gastroenterology;;  EGD and Colon   BIOPSY   06/07/2020    Procedure: BIOPSY;  Surgeon: Lavena Bullion, DO;  Location: [facility];  Service: Gastroenterology;;   BRAVO Bath STUDY N/A 06/07/2020    Procedure: BRAVO Padroni STUDY on PPI;  Surgeon: Lavena Bullion, DO;  Location: [facility];  Service: Gastroenterology;  Laterality: N/A;   COLONOSCOPY   10/10/2009    anal papilla otherwise normal   COLONOSCOPY WITH PROPOFOL N/A 11/02/2019    Procedure: COLONOSCOPY WITH PROPOFOL;  Surgeon: Lavena Bullion, DO;  Location: [facility];  Service: Gastroenterology;  Laterality: N/A;   ESOPHAGOGASTRODUODENOSCOPY    04/07/2009    Normal esophagus, small hiatal hernia   ESOPHAGOGASTRODUODENOSCOPY   01/09/2009    Distal esophageal erosions consistent with mild erosive reflux esophagitis, otherwise normal esophagus, small hiatal herniaotherwise normal stomach, D1-D2   ESOPHAGOGASTRODUODENOSCOPY   08/26/2007    Normal esophagus, a small hiatal/hernia, otherwise normal stomach D1 through D3   ESOPHAGOGASTRODUODENOSCOPY (EGD) WITH PROPOFOL N/A 11/02/2019    Procedure: ESOPHAGOGASTRODUODENOSCOPY (EGD) WITH PROPOFOL;  Surgeon: Lavena Bullion, DO;  Location: [facility];  Service: Gastroenterology;  Laterality: N/A;   ESOPHAGOGASTRODUODENOSCOPY (EGD) WITH PROPOFOL N/A 06/07/2020    Procedure: ESOPHAGOGASTRODUODENOSCOPY (EGD) WITH PROPOFOL;  Surgeon: Lavena Bullion, DO;  Location: [facility];  Service: Gastroenterology;  Laterality: N/A;   FRACTURE SURGERY       ileocolonoscopy   08/26/2007     A normal rectum, colon, and terminal ileum   INCISION AND DRAINAGE   09/04/2015    "reopened my back incsion"   LUMBAR LAMINECTOMY/DECOMPRESSION MICRODISCECTOMY Left 09/23/2017     Procedure: Microdiscectomy - left - Lumbar four-Lumbar five;  Surgeon: Earnie Larsson, MD;  Location: [place];  Service: Neurosurgery;  Laterality: Left;   LUMBAR MICRODISCECTOMY   08/21/2015   LUMBAR MICRODISCECTOMY Left 09/23/2017    L4-5   LUMBAR WOUND DEBRIDEMENT N/A 09/04/2015    Procedure: LUMBAR WOUND DEBRIDEMENT;  Surgeon: Consuella Lose, MD;  Location: [place] [facility];  Service: Neurosurgery;  Laterality: N/A;   right side sugery        enlarged lymph node gland removed under arm pit age 60-5 years   Small bowel capsule   04/11/2009     normal throughout   VASECTOMY               Family History  Problem Relation Age of Onset   Leukemia Father 54   Colon polyps Father     Clotting disorder Father     Heart disease Father     Cancer Father     Depression Father     Sleep apnea Father     Obesity Father     Seizures Mother     Irritable bowel syndrome Mother     Thyroid disease Mother     Depression Mother     Anxiety disorder Mother     Bipolar disorder Brother     ADD / ADHD Brother     Diabetes Paternal Grandmother     Ulcerative colitis Paternal Aunt     Colon cancer Neg Hx     Liver cancer Neg Hx     Esophageal cancer Neg Hx     Stomach cancer Neg Hx     Pancreatic cancer Neg Hx        Social History         Socioeconomic History   Marital status: Married      Spouse name: Tabitha   Number of children: 2   Years of education: Not on file   Highest education level: Not on file  Occupational History   Occupation: Firefighter      Employer: [employer]   Occupation: truck driver  Tobacco Use   Smoking status: Never Smoker   Smokeless tobacco:  Current User      Types: Snuff   Tobacco comment: quitting snuff. whinning off  Vaping Use   Vaping Use: Never used  Substance and Sexual Activity   Alcohol use: Yes      Alcohol/week: 0.0 standard drinks      Comment: rare   Drug use: No   Sexual activity: Not Currently  Other Topics Concern   Not on  file  Social History Narrative   Not on file    Social Determinants of Health    Financial Resource Strain: Not on file  Food Insecurity: Not on file  Transportation Needs: Not on file  Physical Activity: Not on file  Stress: Not on file  Social Connections: Not on file            Current Outpatient Medications on File Prior to Visit  Medication Sig Dispense Refill   dicyclomine (BENTYL) 10 MG capsule Take 1 capsule (10 mg total) by mouth 4 (four) times daily -  before meals and at bedtime. 90 capsule 0   gabapentin (NEURONTIN) 300 MG capsule TAKE 1 CAPSULE BY MOUTH THREE TIMES DAILY 90 capsule 0   [START ON 06/09/2021] ADDERALL XR 20 MG 24 hr capsule Take 1 capsule (20 mg total) by mouth daily. 30 capsule 0   [START ON 05/10/2021] ADDERALL XR 20 MG 24 hr capsule Take 1 capsule (20 mg total) by mouth daily. 30 capsule 0   ADDERALL XR 20 MG 24 hr capsule Take 1 capsule (20 mg total) by mouth daily. 30 capsule 0   cyclobenzaprine (FLEXERIL) 10 MG tablet Take 1 tablet by mouth three times daily as needed for muscle spasm 90 tablet 3   docusate sodium (COLACE) 250 MG capsule Take 250 mg by mouth 2 (two) times daily.       EPINEPHrine 0.3 mg/0.3 mL IJ SOAJ injection Inject 0.3 mg into the muscle as needed for anaphylaxis.       EUTHYROX 75 MCG tablet TAKE 1 TABLET BY MOUTH ONCE DAILY BEFORE BREAKFAST 90 tablet 2   lamoTRIgine (LAMICTAL) 25 MG tablet Take 2 tablets by mouth twice daily 120 tablet 2   lidocaine (LIDODERM) 5 % Place 1 patch onto the skin daily. Remove & Discard patch within 12 hours or as directed by MD 30 patch 0   pantoprazole (PROTONIX) 40 MG tablet Take 1 tablet by mouth twice daily 180 tablet 0   Vitamin D, Ergocalciferol, (DRISDOL) 1.25 MG (50000 UNIT) CAPS capsule TAKE 1 CAPSULE BY MOUTH ONCE A WEEK EVERY  7  DAYS 12 capsule 0    No current facility-administered medications on file prior to visit.      Review of Systems: a complete, 10pt review of systems was completed  with pertinent positives and negatives as documented in the HPI   Physical Exam: Weight: 391.25 lb   Height: 68 in  Body Surface Area: 2.72 m   Body Mass Index: 59.49 kg/m   Temp.: 99 F    Pulse: 138 (Regular)    P.OX: 98% (Room air) BP: 132/98(Sitting, Left Arm, Standard)   He is alert, cooperative, unlabored respirations     CBC Latest Ref Rng & Units 03/22/2020 03/01/2020 09/06/2019  WBC 4.0 - 10.5 K/uL 11.5(H) 7.9 11.4(H)  Hemoglobin 13.0 - 17.0 g/dL 15.1 15.3 16.0  Hematocrit 39.0 - 52.0 % 45.8 46.1 49.6  Platelets 150 - 400 K/uL 300 293 314      CMP Latest Ref Rng & Units 08/02/2020 03/22/2020 12/01/2019  Glucose 65 - 99 mg/dL 97 87 91  BUN 6 - 20 mg/dL 16 12 7   Creatinine 0.76 - 1.27 mg/dL 0.99 0.92 1.02  Sodium 134 - 144 mmol/L 138 137 140  Potassium 3.5 - 5.2 mmol/L 4.3 3.7 4.3  Chloride 96 - 106 mmol/L 101 102 103  CO2 20 - 29 mmol/L 25 24 25   Calcium 8.7 - 10.2 mg/dL 9.3 9.0 9.3  Total Protein 6.0 - 8.5 g/dL 6.3 7.0 6.5  Total Bilirubin 0.0 - 1.2 mg/dL 0.4 1.0 0.4  Alkaline Phos 48 - 121 IU/L 60 56 75  AST 0 - 40 IU/L 13 23 21   ALT 0 - 44 IU/L 19 32 28      Recent Labs       Lab Results  Component Value Date    INR 1.0 03/22/2020    INR 1.02 03/01/2010        Imaging: Imaging Results (Last 48 hours)  No results found.       A/P:  MORBID OBESITY (E66.01), GERD (GASTROESOPHAGEAL REFLUX DISEASE) (K21.9) lthough probably pH study demonstrated good acid suppression he continues to have breakthrough symptoms on high-dose proton pump inhibitor. Given his habitus he is not a candidate for fundoplication. I recommend he consider Roux-en-Y gastric bypass. We discussed the surgery using the diagram to demonstrate the relevant anatomy including technical aspects, the risks of bleeding, infection, pain, scarring, injury to intra-abdominal structures, staple line/ anastomotic leak or abscess, chronic abdominal pain or nausea/ worsening of irritable bowel syndrome complex,  ulcer formation, internal hernia or bowel obstruction, DVT/PE, pneumonia, heart attack, stroke, death, failure to reach weight loss goals and weight regain, hernia. Discussed the typical pre-, peri-, and postoperative course. Discussed the importance of lifelong behavioral changes to combat the chronic and relapsing disease which is obesity. Questions were answered. He is ready to proceed with surgery which is scheduled on the 28th of this month.   OSA ON CPAP (G47.33) TOBACCO ABUSE (Z72.0) Endorses a cessation and nicotine/creatinine testing has been negative. IRRITABLE BOWEL SYNDROME (K58.9) Story: Discussed it is difficult to predict the effect gastric bypass will have on his current quality of life from this standpoint. NONALCOHOLIC HEPATOSTEATOSIS (K76.0) HYPERLIPIDEMIA (E78.5) HYPOTHYROID (E03.9) Story: Synthroid 75 g daily, his primary care doctor is Dr. 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   Seasonal and perennial allergic rhinitis 07/23/2014   Abdominal mass of other site 06/15/2012   Abdominal cramps 06/15/2012   Obstructive sleep apnea 05/03/2012   Syncope and collapse 05/03/2012   Class 3 severe obesity with serious comorbidity and body mass index (BMI) of 50.0 to 59.9 in adult ([place]) 05/03/2012   Anal fissure 05/27/2011   UNSPECIFIED ANEMIA 09/11/2009   Blood in stool 04/06/2009   Lower abdominal pain 03/07/2009   BIPOLAR DISORDER UNSPECIFIED 03/06/2009   SMOKELESS TOBACCO ABUSE 03/06/2009   Attention deficit hyperactivity disorder (ADHD)  03/06/2009   GERD 03/06/2009   Diarrhea 03/06/2009  Romana Juniper, MD [facility], [place]   See AMION to contact appropriate on-call provider

## 2021-06-05 NOTE — Plan of Care (Signed)
  Problem: Activity: Goal: Risk for activity intolerance will decrease Outcome: Progressing   Problem: Nutrition: Goal: Adequate nutrition will be maintained Outcome: Progressing   Problem: Pain Managment: Goal: General experience of comfort will improve Outcome: Progressing   

## 2021-06-05 NOTE — Progress Notes (Signed)
PHARMACY CONSULT FOR:  Risk Assessment for Post-Discharge VTE Following Bariatric Surgery  Post-Discharge VTE Risk Assessment: This patient's probability of 30-day post-discharge VTE is increased due to the factors marked:  X Male    Age >/=60 years  X  BMI >/=50 kg/m2    CHF    Dyspnea at Rest    Paraplegia   X Non-gastric-band surgery    Operation Time >/=3 hr    Return to OR     Length of Stay >/= 3 d   Hx of VTE   Hypercoagulable condition   Significant venous stasis    Predicted probability of 30-day post-discharge VTE: 0.51%- moderate risk   Other patient-specific factors to consider: N/A  Recommendation for Discharge: Enoxaparin 60 mg  q12h x 2 weeks post-discharge  [name] is a 35 y.o. male who underwent  laparoscopic Roux-en-Y gastric bypass on 06/05/21   Case start: 0804 Case end: 1028   Allergies  Allergen Reactions   Ibuprofen Other (See Comments)    Makes ulcers bleed   Influenza Vaccines Shortness Of Breath and Other (See Comments)    Rash and unable to breathe well   Tylenol [Acetaminophen] Other (See Comments)    Bleeding ulcers   Bee Venom Swelling    SWELLING REACTION UNSPECIFIED    Tramadol Itching    Patient Measurements: Weight: (!) 181 kg (399 lb) Body mass index is 58.92 kg/m.  No results for input(s): WBC, HGB, HCT, PLT, APTT, CREATININE, LABCREA, CREATININE, CREAT24HRUR, MG, PHOS, ALBUMIN, PROT, ALBUMIN, AST, ALT, ALKPHOS, BILITOT, BILIDIR, IBILI in the last 72 hours. CrCl cannot be calculated (Patient's most recent lab result is older than the maximum 21 days allowed.).    Past Medical History:  Diagnosis Date   Abscess    Right forearm healing   ADHD (attention deficit hyperactivity disorder)    Anal fissure    Anxiety    Arthritis    back, shoulders, ankles, knees   Back pain    Bipolar disorder (HCC)    Chest pain    Chronic lower back pain    Constipation    Depression    Esophagitis    Distal esophageal  erosions consistent with mild erosive  reflux esophagitis 12/2008 by EGD    External hemorrhoids    Gastric polyps    Gastric ulcer    GERD (gastroesophageal reflux disease)    Hepatic steatosis    Hiatal hernia    History of stomach ulcers    Hx MRSA infection 06/2007   right thigh   Hx of colonic polyps    Hypercholesteremia    denies   Hypothyroidism    Internal hemorrhoids    Irritable bowel syndrome (IBS)    Obesity    Occult GI bleeding 12/2008   Trivial upper GI bleed/uncontrolled GERD by upper endoscopy 12/2008, normal f/u endoscopy 03/2009 with SBCE at that time   OSA on CPAP    Pain management    Pneumonia 09/01/2015   "on ATB still" (09/04/2015)   S/P colonoscopy 11/10, 10/08   Dr [name]   Schizophrenia [facility])    Sleep apnea    Stomach ulcer    Thyroid function test abnormal    Noted in 2011 discharge   Tobacco dipper    Wears contact lenses      Medications Prior to Admission  Medication Sig Dispense Refill Last Dose   ADDERALL XR 20 MG 24 hr capsule Take 1 capsule (20 mg total) by mouth daily.  30 capsule 0 06/04/2021   cetirizine (ZYRTEC) 10 MG tablet Take 10 mg by mouth daily.   06/05/2021 at 0415   cyclobenzaprine (FLEXERIL) 10 MG tablet Take 1 tablet by mouth three times daily as needed for muscle spasm (Patient taking differently: Take 10 mg by mouth 3 (three) times daily as needed for muscle spasms.) 90 tablet 3 06/04/2021   dicyclomine (BENTYL) 10 MG capsule TAKE 1 CAPSULE BY MOUTH 4 TIMES DAILY BEFORE MEAL(S) AND AT BEDTIME 90 capsule 0 06/04/2021   dicyclomine (BENTYL) 20 MG tablet TAKE 1 TABLET BY MOUTH 4 TIMES DAILY BEFORE MEAL(S) AND AT BEDTIME 120 tablet 0 06/04/2021   docusate sodium (COLACE) 250 MG capsule Take 250 mg by mouth daily.   06/04/2021   EPINEPHrine 0.3 mg/0.3 mL IJ SOAJ injection Inject 0.3 mg into the muscle as needed for anaphylaxis.      EUTHYROX 75 MCG tablet TAKE 1 TABLET BY MOUTH ONCE DAILY BEFORE BREAKFAST (Patient taking  differently: Take 75 mcg by mouth daily before breakfast.) 90 tablet 2 06/05/2021 at 0415   gabapentin (NEURONTIN) 300 MG capsule TAKE 1 CAPSULE BY MOUTH THREE TIMES DAILY 90 capsule 1 06/04/2021   lamoTRIgine (LAMICTAL) 25 MG tablet Take 2 tablets by mouth twice daily (Patient taking differently: Take 50 mg by mouth 2 (two) times daily.) 120 tablet 2 06/05/2021 at 0415   pantoprazole (PROTONIX) 40 MG tablet Take 1 tablet by mouth twice daily (Patient taking differently: Take 40 mg by mouth 3 (three) times daily.) 180 tablet 0 06/05/2021 at 0415   Vitamin D, Ergocalciferol, (DRISDOL) 1.25 MG (50000 UNIT) CAPS capsule TAKE 1 CAPSULE BY MOUTH ONCE A WEEK EVERY  7  DAYS (Patient taking differently: Take 50,000 Units by mouth every Thursday.) 12 capsule 0 06/04/2021     Phillip  Wilbern Gardner 06/05/2021,10:54 AM

## 2021-06-05 NOTE — Anesthesia Postprocedure Evaluation (Signed)
Anesthesia Post Note  Patient: MATISSE SALAIS  Procedure(s) Performed: LAPAROSCOPIC ROUX-EN-Y GASTRIC BYPASS WITH UPPER ENDOSCOPY (Abdomen)     Patient location during evaluation: PACU Anesthesia Type: General Level of consciousness: awake and alert Pain management: pain level controlled Vital Signs Assessment: post-procedure vital signs reviewed and stable Respiratory status: spontaneous breathing, nonlabored ventilation, respiratory function stable and patient connected to nasal cannula oxygen Cardiovascular status: blood pressure returned to baseline and stable Postop Assessment: no apparent nausea or vomiting Anesthetic complications: no   No notable events documented.  Last Vitals:  Vitals:   06/05/21 1200 06/05/21 1215  BP: (!) 150/88   Pulse: 74 73  Resp: 13 (!) 7  Temp:    SpO2: 99% 100%    Last Pain:  Vitals:   06/05/21 1145  TempSrc:   PainSc: Asleep                 Sanaiyah Kirchhoff L Keylen Uzelac

## 2021-06-05 NOTE — Op Note (Signed)
[name] [id number] [birth year] 06/05/2021  Preoperative diagnosis: obesity with roux en Y gastric bypass in progress  Postoperative diagnosis: Same   Procedure: Upper endoscopy   Surgeon: Catalina Antigua B. Hassell Done  M.D., FACS   Anesthesia: Gen.   Indications for procedure: This patient was undergoing a gastric bypass.  Endoscopy to assess pouch and anastomosis.    Description of procedure: The endoscopy was placed in the mouth and into the oropharynx and under endoscopic vision it was advanced to the esophagogastric junction.  The pouch was insufflated and was small (~5 cm) and the anastomosis was patent. .   No bleeding or leaks were detected.  The scope was withdrawn without difficulty.     Matt B. Hassell Done, MD, FACS General, Bariatric, & Minimally Invasive Surgery [facility], [place]

## 2021-06-05 NOTE — Transfer of Care (Signed)
Immediate Anesthesia Transfer of Care Note  Patient: [name]  Procedure(s) Performed: LAPAROSCOPIC ROUX-EN-Y GASTRIC BYPASS WITH UPPER ENDOSCOPY (Abdomen)  Patient Location: PACU  Anesthesia Type:General  Level of Consciousness: awake, drowsy and patient cooperative  Airway & Oxygen Therapy: Patient Spontanous Breathing and Patient connected to face mask oxygen  Post-op Assessment: Report given to RN and Post -op Vital signs reviewed and stable  Post vital signs: Reviewed and stable  Last Vitals:  Vitals Value Taken Time  BP    Temp    Pulse 102 06/05/21 1051  Resp 18 06/05/21 1051  SpO2 100 % 06/05/21 1051  Vitals shown include unvalidated device data.  Last Pain:  Vitals:   06/05/21 0538  TempSrc: Oral         Complications: No notable events documented.

## 2021-06-06 ENCOUNTER — Other Ambulatory Visit (HOSPITAL_COMMUNITY): Payer: Self-pay

## 2021-06-06 ENCOUNTER — Encounter (HOSPITAL_COMMUNITY): Payer: Self-pay | Admitting: Surgery

## 2021-06-06 LAB — MAGNESIUM: Magnesium: 2.1 mg/dL (ref 1.7–2.4)

## 2021-06-06 LAB — CBC WITH DIFFERENTIAL/PLATELET
Abs Immature Granulocytes: 0.04 10*3/uL (ref 0.00–0.07)
Basophils Absolute: 0 10*3/uL (ref 0.0–0.1)
Basophils Relative: 0 %
Eosinophils Absolute: 0 10*3/uL (ref 0.0–0.5)
Eosinophils Relative: 0 %
HCT: 41.2 % (ref 39.0–52.0)
Hemoglobin: 13 g/dL (ref 13.0–17.0)
Immature Granulocytes: 0 %
Lymphocytes Relative: 19 %
Lymphs Abs: 2.4 10*3/uL (ref 0.7–4.0)
MCH: 27.3 pg (ref 26.0–34.0)
MCHC: 31.6 g/dL (ref 30.0–36.0)
MCV: 86.4 fL (ref 80.0–100.0)
Monocytes Absolute: 0.7 10*3/uL (ref 0.1–1.0)
Monocytes Relative: 6 %
Neutro Abs: 9.2 10*3/uL — ABNORMAL HIGH (ref 1.7–7.7)
Neutrophils Relative %: 75 %
Platelets: 226 10*3/uL (ref 150–400)
RBC: 4.77 MIL/uL (ref 4.22–5.81)
RDW: 15 % (ref 11.5–15.5)
WBC: 12.3 10*3/uL — ABNORMAL HIGH (ref 4.0–10.5)
nRBC: 0 % (ref 0.0–0.2)

## 2021-06-06 LAB — GLUCOSE, CAPILLARY
Glucose-Capillary: 105 mg/dL — ABNORMAL HIGH (ref 70–99)
Glucose-Capillary: 99 mg/dL (ref 70–99)
Glucose-Capillary: 56 mg/dL — ABNORMAL LOW (ref 70–99)
Glucose-Capillary: 64 mg/dL — ABNORMAL LOW (ref 70–99)
Glucose-Capillary: 71 mg/dL (ref 70–99)
Glucose-Capillary: 98 mg/dL (ref 70–99)
Glucose-Capillary: 62 mg/dL — ABNORMAL LOW (ref 70–99)
Glucose-Capillary: 67 mg/dL — ABNORMAL LOW (ref 70–99)
Glucose-Capillary: 73 mg/dL (ref 70–99)
Glucose-Capillary: 87 mg/dL (ref 70–99)

## 2021-06-06 LAB — COMPREHENSIVE METABOLIC PANEL
ALT: 40 U/L (ref 0–44)
AST: 36 U/L (ref 15–41)
Albumin: 3.8 g/dL (ref 3.5–5.0)
Alkaline Phosphatase: 48 U/L (ref 38–126)
Anion gap: 7 (ref 5–15)
BUN: 14 mg/dL (ref 6–20)
CO2: 26 mmol/L (ref 22–32)
Calcium: 8.7 mg/dL — ABNORMAL LOW (ref 8.9–10.3)
Chloride: 105 mmol/L (ref 98–111)
Creatinine, Ser: 1.03 mg/dL (ref 0.61–1.24)
GFR, Estimated: >60 mL/min (ref >60)
Glucose, Bld: 105 mg/dL — ABNORMAL HIGH (ref 70–99)
Potassium: 4.2 mmol/L (ref 3.5–5.1)
Sodium: 138 mmol/L (ref 135–145)
Total Bilirubin: 0.7 mg/dL (ref 0.3–1.2)
Total Protein: 6.3 g/dL — ABNORMAL LOW (ref 6.5–8.1)

## 2021-06-06 MED ORDER — KETOROLAC TROMETHAMINE 15 MG/ML IJ SOLN
15.0000 mg | Freq: Once | INTRAMUSCULAR | Status: AC
  Administered 2021-06-06: 15 mg via INTRAVENOUS
  Filled 2021-06-06: qty 1

## 2021-06-06 MED ORDER — PANTOPRAZOLE SODIUM 40 MG PO TBEC
40.0000 mg | DELAYED_RELEASE_TABLET | Freq: Two times a day (BID) | ORAL | 0 refills | Status: DC
  Filled 2021-06-06: qty 180, 90d supply, fill #0

## 2021-06-06 MED ORDER — ENOXAPARIN SODIUM 60 MG/0.6ML IJ SOSY
60.0000 mg | PREFILLED_SYRINGE | Freq: Two times a day (BID) | INTRAMUSCULAR | 0 refills | Status: DC
  Filled 2021-06-06: qty 16.8, 14d supply, fill #0

## 2021-06-06 MED ORDER — ONDANSETRON 4 MG PO TBDP
4.0000 mg | ORAL_TABLET | Freq: Four times a day (QID) | ORAL | 0 refills | Status: DC | PRN
  Filled 2021-06-06: qty 18, 21d supply, fill #0

## 2021-06-06 MED ORDER — METHOCARBAMOL 1000 MG/10ML IJ SOLN
500.0000 mg | Freq: Four times a day (QID) | INTRAVENOUS | Status: DC | PRN
  Filled 2021-06-06: qty 5

## 2021-06-06 MED ORDER — DEXTROSE 50 % IV SOLN
INTRAVENOUS | Status: AC
  Administered 2021-06-06: 25 mL
  Filled 2021-06-06: qty 50

## 2021-06-06 MED ORDER — OXYCODONE HCL 5 MG PO TABS
5.0000 mg | ORAL_TABLET | Freq: Four times a day (QID) | ORAL | 0 refills | Status: DC | PRN
  Filled 2021-06-06: qty 15, 4d supply, fill #0

## 2021-06-06 NOTE — Progress Notes (Addendum)
Patient blood sugar continues to trend down regardless of drinking additional 6oz of milk. Per protocol a second does of dextrose was administered at 1630. Recheck at 1645 was 34. Patient instructed to drink more protein drink consistently.

## 2021-06-06 NOTE — Progress Notes (Addendum)
Hypoglycemic Event  CBG: 52  Treatment: 4 oz milk   Symptoms: lethargic  Follow-up CBG: Time: 1208 CBG Result:71                             Time 1227  result: 64                             Time 1250   result 98  Possible Reasons for Event: oral intake changes  Comments/MD notified: administered 1ml of dextrose per protocol.    Kasarah Sitts Hetty Ely

## 2021-06-06 NOTE — Progress Notes (Signed)
S: Had a decent night, fair amount of gas pain which seems to be getting a little bit better.  No nausea, tolerating liquids without difficulty.  Walking in the halls, using incentive spirometer  O: Vitals, labs, intake/output, and orders reviewed at this time. Afebrile, no tachycardia, normotensive. 99% on room air.  CMP unremarkable, WBC 12.3 (7.9 preop), Hgb 13.0 (14.2 preop). PO 240, UOP 950+3x.   Pain/nausea regimen: Not receiving scheduled tylenol or PRN tramadol due to reported allergy. On Scheduled gabapentin, robaxin, reglan. Received dilaudid x 1 (12:48pm yesterday), toradol x 1 (16:51 yesterday), zofran x 1 (12:52 yesterday), simethicone x 3, oxycodone x 3 (13:45, 20:30, 0:515)     Gen: A&Ox3, no distress  H&N: EOMI, atraumatic, neck supple Chest: unlabored respirations, RRR Abd: soft, nontender, nondistended, incision(s) c/d/i with steri strips, no cellulitis.  There is a small (approximately 2.5 cm) hematoma superior lateral to the most lateral right-sided port, likely from Exparel injection Ext: warm, no edema Neuro: grossly normal  Lines/tubes/drains: PIV  A/P: Post-op day 1 s/p laparoscopic roux en y gastric bypass -Continue clears, start protein shakes -Ambulate, continue SCDs while in bed and prophylactic lovenox -Pulmonary toilet -We will do another one-time dose of Toradol this morning to see if that helps with the gas pain -DC insulin and fingersticks as he has not required any since immediately postop  Continue to monitor throughout the day, if doing well with protein shakes and pain is more manageable, plan discharge this evening.   Romana Juniper, MD [facility], [place]

## 2021-06-06 NOTE — Progress Notes (Signed)
Nutrition Education Note ° °Received consult for diet education for patient s/p bariatric surgery. ° °Discussed 2 week post op diet with pt. Emphasized that liquids must be non carbonated, non caffeinated, and sugar free. Fluid goals discussed. Pt to follow up with outpatient bariatric RD for further diet progression after 2 weeks. Multivitamins and minerals also reviewed. Teach back method used, pt expressed understanding, expect good compliance. ° °If nutrition issues arise, please consult RD. ° °Phillip Mastro, MS, RD, LDN °Inpatient Clinical Dietitian °Contact information available via Amion ° ° °

## 2021-06-06 NOTE — Progress Notes (Signed)
Patient alert and oriented, pain is controlled. Patient is tolerating fluids, advanced to protein shake today, patient is tolerating well. Reviewed Gastric Bypass discharge instructions with patient and patient is able to articulate understanding. Provided information on BELT program, Support Group and [facility] outpatient pharmacy.    1st protein shake given to patient.  Patient watched Lovenox self injection video and discussed Lovenox patient education kit.   All questions answered, will continue to monitor.

## 2021-06-06 NOTE — Progress Notes (Signed)
While attempting to administer liquid oxycodone, patient accidentally hit the cup, spilling liquid to the floor.

## 2021-06-07 ENCOUNTER — Other Ambulatory Visit (HOSPITAL_COMMUNITY): Payer: Self-pay

## 2021-06-07 LAB — BASIC METABOLIC PANEL
Anion gap: 6 (ref 5–15)
BUN: 14 mg/dL (ref 6–20)
CO2: 28 mmol/L (ref 22–32)
Calcium: 8.6 mg/dL — ABNORMAL LOW (ref 8.9–10.3)
Chloride: 106 mmol/L (ref 98–111)
Creatinine, Ser: 0.97 mg/dL (ref 0.61–1.24)
GFR, Estimated: >60 mL/min (ref >60)
Glucose, Bld: 91 mg/dL (ref 70–99)
Potassium: 4 mmol/L (ref 3.5–5.1)
Sodium: 140 mmol/L (ref 135–145)

## 2021-06-07 LAB — MAGNESIUM: Magnesium: 2 mg/dL (ref 1.7–2.4)

## 2021-06-07 NOTE — Discharge Summary (Signed)
Physician Discharge Summary  [name] XFG:[id number] DOB: 1986/07/05 DOA: 06/05/2021  PCP: [name], DO  Admit date: 06/05/2021 Discharge date: 06/07/2021   Recommendations for Outpatient Follow-up:     Follow-up Information     Clovis Riley, MD. Go on 06/28/2021.   Specialty: General Surgery Why: at 9:20am.  Please arrive 15 minutes prior to your appointment time.  Thank you. Contact information: [address] [place] [address] [phone]         [facility] [place]. Go on 07/26/2021.   Specialty: General Surgery Why: at 9:20am with Dr. Kae Heller.  Please arrive 15 minutes prior to your appointment time.  Thank you. Contact information: [place]  [address] [phone]                Discharge Diagnoses:  Active Problems:   Morbid obesity ([place])   Surgical Procedure: Laparoscopic Roux-en-Y gastric bypass, upper endoscopy  Discharge Condition: Good Disposition: Home  Diet recommendation: Postoperative gastric bypass diet  Filed Weights   06/05/21 0538 06/05/21 1323  Weight: (!) 181 kg (!) 181 kg     Hospital Course:  The patient was admitted for a planned laparoscopic Roux-en-Y gastric bypass. Please see operative note. Preoperatively the patient was given 5000 units of subcutaneous heparin for DVT prophylaxis. ERAS protocol was used. Postoperative prophylactic Lovenox dosing was started on the evening of postoperative day 0.  The patient was started on ice chips and water on the evening of POD 0 which they tolerated. On postoperative day 1 The patient's diet was advanced to protein shakes which they also tolerated. He did have an episode of hypoglycemia on post-op day 1. On POD 2, The patient was ambulating without difficulty. Their vital signs are stable without fever or tachycardia. Their hemoglobin had remained stable. The patient was maintained on their home settings for CPAP  therapy. The patient had received discharge instructions and counseling. They were deemed stable for discharge.  BP 126/88 (BP Location: Right Arm)   Pulse (!) 59   Temp 98.5 F (36.9 C) (Oral)   Resp 18   Ht 5\' 9"  (1.753 m)   Wt (!) 181 kg   SpO2 99%   BMI 58.93 kg/m   Gen: alert, NAD, non-toxic appearing Pupils: equal, no scleral icterus Pulm: Lungs clear to auscultation, symmetric chest rise CV: regular rate and rhythm Abd: soft, nontender, nondistended. Evolving ecchymosis most prominent at R lateral port site. No cellulitis. No incisional hernia Ext: no edema, no calf tenderness Skin: no rash, no jaundice  Discharge Instructions   Allergies as of 06/07/2021       Reactions   Ibuprofen Other (See Comments)   Makes ulcers bleed   Influenza Vaccines Shortness Of Breath, Other (See Comments)   Rash and unable to breathe well   Tylenol [acetaminophen] Hives   Bee Venom Swelling   SWELLING REACTION UNSPECIFIED    Tramadol Itching        Medication List     STOP taking these medications    dicyclomine 10 MG capsule Commonly known as: BENTYL   dicyclomine 20 MG tablet Commonly known as: BENTYL       TAKE these medications    Adderall XR 20 MG 24 hr capsule Generic drug: amphetamine-dextroamphetamine Take 1 capsule (20 mg total) by mouth daily.   cetirizine 10 MG tablet Commonly known as: ZYRTEC Take 10 mg by mouth daily.   docusate sodium 250 MG capsule  Commonly known as: COLACE Take 250 mg by mouth daily.   enoxaparin 60 MG/0.6ML injection Commonly known as: LOVENOX Inject 0.6 mLs (60 mg total) into the skin 2 (two) times daily for 14 days.   EPINEPHrine 0.3 mg/0.3 mL Soaj injection Commonly known as: EPI-PEN Inject 0.3 mg into the muscle as needed for anaphylaxis.   gabapentin 300 MG capsule Commonly known as: NEURONTIN TAKE 1 CAPSULE BY MOUTH THREE TIMES DAILY   ondansetron 4 MG disintegrating tablet Commonly known as:  ZOFRAN-ODT Dissolve 1 tablet (4 mg total) by mouth every 6 (six) hours as needed for nausea or vomiting.   oxyCODONE 5 MG immediate release tablet Commonly known as: Oxy IR/ROXICODONE Take 1 tablet (5 mg total) by mouth every 6 (six) hours as needed for severe pain.   pantoprazole 40 MG tablet Commonly known as: PROTONIX Take 1 tablet (40 mg total) by mouth 2 (two) times daily. **Take twice a day for 1 month after surgery, then decrease to once daily. What changed: when to take this       ASK your doctor about these medications    cyclobenzaprine 10 MG tablet Commonly known as: FLEXERIL Take 1 tablet by mouth three times daily as needed for muscle spasm   Euthyrox 75 MCG tablet Generic drug: levothyroxine TAKE 1 TABLET BY MOUTH ONCE DAILY BEFORE BREAKFAST   lamoTRIgine 25 MG tablet Commonly known as: LAMICTAL Take 2 tablets by mouth twice daily   Vitamin D (Ergocalciferol) 1.25 MG (50000 UNIT) Caps capsule Commonly known as: DRISDOL TAKE 1 CAPSULE BY MOUTH ONCE A WEEK EVERY  7  DAYS        Follow-up Information     Clovis Riley, MD. Go on 06/28/2021.   Specialty: General Surgery Why: at 9:20am.  Please arrive 15 minutes prior to your appointment time.  Thank you. Contact information: [address] [place] [address] [phone]         Sur[facility], [place]. Go on 07/26/2021.   Specialty: General Surgery Why: at 9:20am with Dr. Kae Heller.  Please arrive 15 minutes prior to your appointment time.  Thank you. Contact information: [place] [address] [phone]                  The results of significant diagnostics from this hospitalization (including imaging, microbiology, ancillary and laboratory) are listed below for reference.    Significant Diagnostic Studies: No results found.  Labs: Basic Metabolic Panel: Recent Labs  Lab 06/06/21 0421 06/07/21 0356  NA 138 140  K 4.2 4.0  CL  105 106  CO2 26 28  GLUCOSE 105* 91  BUN 14 14  CREATININE 1.03 0.97  CALCIUM 8.7* 8.6*  MG 2.1 2.0   Liver Function Tests: Recent Labs  Lab 06/06/21 0421  AST 36  ALT 40  ALKPHOS 48  BILITOT 0.7  PROT 6.3*  ALBUMIN 3.8    CBC: Recent Labs  Lab 06/06/21 0421  WBC 12.3*  NEUTROABS 9.2*  HGB 13.0  HCT 41.2  MCV 86.4  PLT 226    CBG: Recent Labs  Lab 06/06/21 1257 06/06/21 1601 06/06/21 1618 06/06/21 1650 06/06/21 2202  GLUCAP 98 67* 62* 73 87    Active Problems:   Morbid obesity ([place])    Signed:  [facility], [place] [phone] 06/07/2021, 8:31 AM

## 2021-06-07 NOTE — Progress Notes (Signed)
Pt alert and oriented. D/C instructions reviewed. Pt d/cd to home.

## 2021-06-07 NOTE — Progress Notes (Signed)
24hr fluid recall: 36mL.  Per dehydration protocol, will call pt to f/u within one week post op.

## 2021-06-08 ENCOUNTER — Other Ambulatory Visit (INDEPENDENT_AMBULATORY_CARE_PROVIDER_SITE_OTHER): Payer: Self-pay | Admitting: Family Medicine

## 2021-06-08 DIAGNOSIS — E559 Vitamin D deficiency, unspecified: Secondary | ICD-10-CM

## 2021-06-11 ENCOUNTER — Telehealth (HOSPITAL_COMMUNITY): Payer: Self-pay | Admitting: *Deleted

## 2021-06-11 NOTE — Telephone Encounter (Signed)
1.  Tell me about your pain and pain management? Pt denies any current abdominal pain.   2.  Let's talk about fluid intake.  How much total fluid are you taking in? Pt states that he is getting in at least 100oz of fluid including protein water, bottled water, Gatorade Zero and chicken broth.  3.  How much protein have you taken in the last 2 days? Pt states that he is working to meet goal of goal of 80g of protein today.  Pt states that milk base protein shakes makes his stomach cramp.  He is only drinking protein waters.  Pt is not drinking enough to meet goal.  Discussed with pt to purchase non-flavored protein powder today to add to liquids to meet daily goal.  4.  Have you had nausea?  Tell me about when have experienced nausea and what you did to help? Pt denies nausea.   5.  Has the frequency or color changed with your urine? Pt states that he is urinating frequently.     6.  Tell me what your incisions look like? "Incisions look fine". Pt denies a fever, chills.  Pt states incisions are not swollen, open, or draining.  Pt encouraged to call [facility] if incisions change.   7.  Have you been passing gas? BM? Pt states that he is having BMs. Last BM 06/10/21.     8.  If a problem or question were to arise who would you call?  Do you know contact numbers for [place], [facility], and [facility]? Pt denies dehydration symptoms.  Pt can describe s/sx of dehydration.  Pt knows to call [facility] for surgical, [facility] for nutrition, and [facility] for non-urgent questions or concerns.   9.  How has the walking going? Pt states he is walking around and able to be active without difficulty.   10. Are you still using your incentive spirometer?  If so, how often? Pt states that he does it "every and again". Pt encouraged to use incentive spirometer, at least 10x every hour while awake until she sees the surgeon.  11.  How are your vitamins and calcium going?  How are you taking them? Pt states that he is taking his supplements  and vitamins without difficulty.  Pt discussed possibly changing to calcium citrate due to family history of kidney stones.   Reminded patient that the first 30 days post-operatively are important for successful recovery.  Practice good hand hygiene, wearing a mask when appropriate (since optional in most places), and minimizing exposure to people who live outside of the home, especially if they are exhibiting any respiratory, GI, or illness-like symptoms.

## 2021-06-12 ENCOUNTER — Encounter: Payer: Self-pay | Admitting: Family Medicine

## 2021-06-12 ENCOUNTER — Ambulatory Visit (INDEPENDENT_AMBULATORY_CARE_PROVIDER_SITE_OTHER): Payer: 59 | Admitting: Family Medicine

## 2021-06-12 ENCOUNTER — Telehealth: Payer: Self-pay | Admitting: Family Medicine

## 2021-06-12 ENCOUNTER — Other Ambulatory Visit: Payer: Self-pay

## 2021-06-12 ENCOUNTER — Ambulatory Visit (INDEPENDENT_AMBULATORY_CARE_PROVIDER_SITE_OTHER): Payer: 59

## 2021-06-12 VITALS — BP 113/72 | HR 92 | Temp 97.8°F | Ht 69.0 in | Wt 385.8 lb

## 2021-06-12 DIAGNOSIS — Z9884 Bariatric surgery status: Secondary | ICD-10-CM

## 2021-06-12 DIAGNOSIS — E559 Vitamin D deficiency, unspecified: Secondary | ICD-10-CM

## 2021-06-12 DIAGNOSIS — R109 Unspecified abdominal pain: Secondary | ICD-10-CM

## 2021-06-12 DIAGNOSIS — F902 Attention-deficit hyperactivity disorder, combined type: Secondary | ICD-10-CM | POA: Diagnosis not present

## 2021-06-12 LAB — MICROSCOPIC EXAMINATION
Bacteria, UA: NONE SEEN
Epithelial Cells (non renal): NONE SEEN /hpf (ref 0–10)
RBC, Urine: NONE SEEN /hpf (ref 0–2)
WBC, UA: NONE SEEN /hpf (ref 0–5)

## 2021-06-12 LAB — URINALYSIS, ROUTINE W REFLEX MICROSCOPIC
Bilirubin, UA: NEGATIVE
Glucose, UA: NEGATIVE
Leukocytes,UA: NEGATIVE
Nitrite, UA: NEGATIVE
Protein,UA: NEGATIVE
RBC, UA: NEGATIVE
Specific Gravity, UA: 1.025 (ref 1.005–1.030)
Urobilinogen, Ur: 0.2 mg/dL (ref 0.2–1.0)
pH, UA: 6 (ref 5.0–7.5)

## 2021-06-12 MED ORDER — AMPHETAMINE-DEXTROAMPHETAMINE 20 MG PO TABS
20.0000 mg | ORAL_TABLET | Freq: Every day | ORAL | 0 refills | Status: DC
Start: 2021-06-12 — End: 2021-09-24

## 2021-06-12 MED ORDER — AMPHETAMINE-DEXTROAMPHETAMINE 20 MG PO TABS: 20.0000 mg | ORAL_TABLET | Freq: Every day | ORAL | 0 refills | Status: DC

## 2021-06-12 MED ORDER — VITAMIN D (ERGOCALCIFEROL) 1.25 MG (50000 UNIT) PO CAPS: 50000.0000 [IU] | ORAL_CAPSULE | ORAL | 1 refills | Status: DC

## 2021-06-12 MED ORDER — AMPHETAMINE-DEXTROAMPHETAMINE 20 MG PO TABS
20.0000 mg | ORAL_TABLET | Freq: Every day | ORAL | 0 refills | Status: DC
Start: 2021-07-11 — End: 2021-09-24

## 2021-06-12 NOTE — Patient Instructions (Signed)

## 2021-06-12 NOTE — Progress Notes (Signed)
80  Subjective: CC: ADHD, recent gastric bypass surgery PCP: [name], DO SKA:[id number] B [name] is a 35 y.o. male presenting to clinic today for:  1.  ADHD, gastric surgery Patient reports that he has not taken any of his Adderall since his gastric bypass surgery approximately 6 days ago.  He was told that he cannot take extended release medication anymore due to surgery.  He is here for medication change and for follow-up on surgery.  Overall has been feeling pretty good.  He does have some slight bruising at the incision sites but seems to be doing fairly well.  He reports good bowel movements.  Normal urine output.  He is only on a liquid diet right now and thinks he is lost about 15 pounds since his surgery.  He does admit that he started having some flank pain on the left side over the last couple of days and at the behest of his nurse was told to get this checked out for any renal stones.  He reports a family history of renal stones.  Has not had any issues with vomiting but did have some slight nausea.  Overall he seems to be recovering well from his gastric surgery with anticipated upcoming appointment with gastric surgeon at [facility] surgery in about 3 weeks.  ROS: Per HPI  Allergies  Allergen Reactions   Ibuprofen Other (See Comments)    Makes ulcers bleed   Influenza Vaccines Shortness Of Breath and Other (See Comments)    Rash and unable to breathe well   Tylenol [Acetaminophen] Hives   Bee Venom Swelling    SWELLING REACTION UNSPECIFIED    Tramadol Itching   Past Medical History:  Diagnosis Date   Abscess    Right forearm healing   ADHD (attention deficit hyperactivity disorder)    Anal fissure    Anxiety    Arthritis    back, shoulders, ankles, knees   Back pain    Bipolar disorder ([facility])    Chest pain    Chronic lower back pain    Constipation    Depression    Esophagitis    Distal esophageal erosions consistent with mild erosive  reflux  esophagitis 12/2008 by EGD    External hemorrhoids    Gastric polyps    Gastric ulcer    GERD (gastroesophageal reflux disease)    Hepatic steatosis    Hiatal hernia    History of stomach ulcers    Hx MRSA infection 06/2007   right thigh   Hx of colonic polyps    Hypercholesteremia    denies   Hypothyroidism    Internal hemorrhoids    Irritable bowel syndrome (IBS)    Obesity    Occult GI bleeding 12/2008   Trivial upper GI bleed/uncontrolled GERD by upper endoscopy 12/2008, normal f/u endoscopy 03/2009 with [facility] at that time   OSA on CPAP    Pain management    Pneumonia 09/01/2015   "on ATB still" (09/04/2015)   S/P colonoscopy 11/10, 10/08   Dr [name]   Schizophrenia [facility])    Sleep apnea    Stomach ulcer    Thyroid function test abnormal    Noted in 2011 discharge   Tobacco dipper    Wears contact lenses     Current Outpatient Medications:    ADDERALL XR 20 MG 24 hr capsule, Take 1 capsule (20 mg total) by mouth daily., Disp: 30 capsule, Rfl: 0   cetirizine (ZYRTEC) 10 MG tablet, Take  10 mg by mouth daily., Disp: , Rfl:    cyclobenzaprine (FLEXERIL) 10 MG tablet, Take 1 tablet by mouth three times daily as needed for muscle spasm (Patient taking differently: Take 10 mg by mouth 3 (three) times daily as needed for muscle spasms.), Disp: 90 tablet, Rfl: 3   docusate sodium (COLACE) 250 MG capsule, Take 250 mg by mouth daily., Disp: , Rfl:    enoxaparin (LOVENOX) 60 MG/0.6ML injection, Inject 0.6 mLs (60 mg total) into the skin 2 (two) times daily for 14 days., Disp: 16.8 mL, Rfl: 0   EPINEPHrine 0.3 mg/0.3 mL IJ SOAJ injection, Inject 0.3 mg into the muscle as needed for anaphylaxis., Disp: , Rfl:    EUTHYROX 75 MCG tablet, TAKE 1 TABLET BY MOUTH ONCE DAILY BEFORE BREAKFAST (Patient taking differently: Take 75 mcg by mouth daily before breakfast.), Disp: 90 tablet, Rfl: 2   gabapentin (NEURONTIN) 300 MG capsule, TAKE 1 CAPSULE BY MOUTH THREE TIMES DAILY, Disp: 90  capsule, Rfl: 1   lamoTRIgine (LAMICTAL) 25 MG tablet, Take 2 tablets by mouth twice daily (Patient taking differently: Take 50 mg by mouth 2 (two) times daily.), Disp: 120 tablet, Rfl: 2   ondansetron (ZOFRAN-ODT) 4 MG disintegrating tablet, Dissolve 1 tablet (4 mg total) by mouth every 6 (six) hours as needed for nausea or vomiting., Disp: 20 tablet, Rfl: 0   oxyCODONE (OXY IR/ROXICODONE) 5 MG immediate release tablet, Take 1 tablet (5 mg total) by mouth every 6 (six) hours as needed for severe pain., Disp: 15 tablet, Rfl: 0   pantoprazole (PROTONIX) 40 MG tablet, Take 1 tablet (40 mg total) by mouth 2 (two) times daily. **Take twice a day for 1 month after surgery, then decrease to once daily., Disp: 180 tablet, Rfl: 0   Vitamin D, Ergocalciferol, (DRISDOL) 1.25 MG (50000 UNIT) CAPS capsule, TAKE 1 CAPSULE BY MOUTH ONCE A WEEK EVERY  7  DAYS (Patient taking differently: Take 50,000 Units by mouth every Thursday.), Disp: 12 capsule, Rfl: 0 Social History   Socioeconomic History   Marital status: Married    Spouse name: Tabitha   Number of children: 2   Years of education: Not on file   Highest education level: Not on file  Occupational History   Occupation: Firefighter    Employer: [employer]   Occupation: truck driver  Tobacco Use   Smoking status: Never   Smokeless tobacco: Former    Types: Snuff    Quit date: 12/16/2020   Tobacco comments:    quitting snuff. whinning off   Vaping Use   Vaping Use: Never used  Substance and Sexual Activity   Alcohol use: Yes    Alcohol/week: 0.0 standard drinks    Comment: rare   Drug use: No   Sexual activity: Not Currently  Other Topics Concern   Not on file  Social History Narrative   Not on file   Social Determinants of Health   Financial Resource Strain: Not on file  Food Insecurity: Not on file  Transportation Needs: Not on file  Physical Activity: Not on file  Stress: Not on file  Social Connections: Not on file   Intimate Partner Violence: Not on file   Family History  Problem Relation Age of Onset   Leukemia Father 49   Colon polyps Father    Clotting disorder Father    Heart disease Father    Cancer Father    Depression Father    Sleep apnea Father  Obesity Father    Seizures Mother    Irritable bowel syndrome Mother    Thyroid disease Mother    Depression Mother    Anxiety disorder Mother    Bipolar disorder Brother    ADD / ADHD Brother    Diabetes Paternal Grandmother    Ulcerative colitis Paternal Aunt    Colon cancer Neg Hx    Liver cancer Neg Hx    Esophageal cancer Neg Hx    Stomach cancer Neg Hx    Pancreatic cancer Neg Hx     Objective: Office vital signs reviewed. BP 113/72   Pulse 92   Temp 97.8 F (36.6 C)   Ht 5' 9" (1.753 m)   Wt (!) 385 lb 12.8 oz (175 kg)   SpO2 100%   BMI 56.97 kg/m   Physical Examination:  General: Awake, alert, well appearing, No acute distress HEENT: Normal; sclera white Cardio: regular rate and rhythm, S1S2 heard, no murmurs appreciated Pulm: clear to auscultation bilaterally, no wheezes, rhonchi or rales; normal work of breathing on room air GI: Obese with healing ecchymosis.  Postsurgical sites with Steri-Strips in place and no evidence of secondary infection.  Assessment/ Plan: 35 y.o. male   Attention deficit hyperactivity disorder (ADHD), combined type - Plan: amphetamine-dextroamphetamine (ADDERALL) 20 MG tablet, amphetamine-dextroamphetamine (ADDERALL) 20 MG tablet, amphetamine-dextroamphetamine (ADDERALL) 20 MG tablet  S/P gastric bypass - Plan: CMP14+EGFR, CBC, Magnesium, Urinalysis, Routine w reflex microscopic, DG Abd 1 View  Left flank pain - Plan: Urinalysis, Routine w reflex microscopic, DG Abd 1 View  Trial of immediate release 20 mg Adderall.  We discussed that if this was too stimulating for him he could cut the tablet in half.  I will plan to do a video visit with him in about a month to ensure that this  medication is going okay in the setting of recent gastric bypass surgery.  National narcotic database was reviewed and there were no red flags.  He is up-to-date on [facility] and [facility]  Already had a 15 pound weight loss status post gastric bypass surgery less than 1 week ago.  Check for any electrolyte imbalance or evidence of insidious bleed after surgery.  Urinalysis was obtained given reports of left-sided flank pain.  He is not demonstrating any other concerning symptoms or signs to suggest infection but may have in fact a stone.  X-ray was obtained which showed no radiopacities but we discussed that if symptoms were persistent or he develops any other concerning symptoms or signs he is to seek immediate medical attention at which point would need to consider renal stone scan.  No orders of the defined types were placed in this encounter.  No orders of the defined types were placed in this encounter.    [name], DO [place] [phone]

## 2021-06-13 ENCOUNTER — Emergency Department (HOSPITAL_BASED_OUTPATIENT_CLINIC_OR_DEPARTMENT_OTHER): Payer: 59

## 2021-06-13 ENCOUNTER — Emergency Department (HOSPITAL_BASED_OUTPATIENT_CLINIC_OR_DEPARTMENT_OTHER)
Admission: EM | Admit: 2021-06-13 | Discharge: 2021-06-13 | Disposition: A | Discharge status: Home / Self Care | Payer: 59 | Attending: Emergency Medicine | Admitting: Emergency Medicine

## 2021-06-13 ENCOUNTER — Encounter (HOSPITAL_BASED_OUTPATIENT_CLINIC_OR_DEPARTMENT_OTHER): Payer: Self-pay

## 2021-06-13 ENCOUNTER — Other Ambulatory Visit: Payer: Self-pay

## 2021-06-13 ENCOUNTER — Telehealth (HOSPITAL_COMMUNITY): Payer: Self-pay | Admitting: *Deleted

## 2021-06-13 DIAGNOSIS — R109 Unspecified abdominal pain: Secondary | ICD-10-CM | POA: Diagnosis not present

## 2021-06-13 DIAGNOSIS — N281 Cyst of kidney, acquired: Secondary | ICD-10-CM | POA: Diagnosis not present

## 2021-06-13 DIAGNOSIS — Z87891 Personal history of nicotine dependence: Secondary | ICD-10-CM | POA: Diagnosis not present

## 2021-06-13 DIAGNOSIS — K760 Fatty (change of) liver, not elsewhere classified: Secondary | ICD-10-CM | POA: Insufficient documentation

## 2021-06-13 DIAGNOSIS — G4733 Obstructive sleep apnea (adult) (pediatric): Secondary | ICD-10-CM | POA: Diagnosis not present

## 2021-06-13 DIAGNOSIS — M5137 Other intervertebral disc degeneration, lumbosacral region: Secondary | ICD-10-CM | POA: Diagnosis not present

## 2021-06-13 DIAGNOSIS — E039 Hypothyroidism, unspecified: Secondary | ICD-10-CM | POA: Diagnosis not present

## 2021-06-13 DIAGNOSIS — K828 Other specified diseases of gallbladder: Secondary | ICD-10-CM | POA: Diagnosis not present

## 2021-06-13 LAB — MAGNESIUM: Magnesium: 2.2 mg/dL (ref 1.6–2.3)

## 2021-06-13 LAB — URINALYSIS, ROUTINE W REFLEX MICROSCOPIC
Glucose, UA: NEGATIVE mg/dL
Hgb urine dipstick: NEGATIVE
Ketones, ur: 15 mg/dL — AB
Leukocytes,Ua: NEGATIVE
Nitrite: NEGATIVE
Protein, ur: NEGATIVE mg/dL
Specific Gravity, Urine: 1.025 (ref 1.005–1.030)
pH: 6 (ref 5.0–8.0)

## 2021-06-13 LAB — CMP14+EGFR
ALT: 68 IU/L — ABNORMAL HIGH (ref 0–44)
AST: 42 IU/L — ABNORMAL HIGH (ref 0–40)
Albumin/Globulin Ratio: 2.2 (ref 1.2–2.2)
Albumin: 4.6 g/dL (ref 4.0–5.0)
Alkaline Phosphatase: 64 IU/L (ref 44–121)
BUN/Creatinine Ratio: 11 (ref 9–20)
BUN: 10 mg/dL (ref 6–20)
Bilirubin Total: 0.5 mg/dL (ref 0.0–1.2)
CO2: 24 mmol/L (ref 20–29)
Calcium: 10.1 mg/dL (ref 8.7–10.2)
Chloride: 98 mmol/L (ref 96–106)
Creatinine, Ser: 0.93 mg/dL (ref 0.76–1.27)
Globulin, Total: 2.1 g/dL (ref 1.5–4.5)
Glucose: 81 mg/dL (ref 65–99)
Potassium: 4.8 mmol/L (ref 3.5–5.2)
Sodium: 141 mmol/L (ref 134–144)
Total Protein: 6.7 g/dL (ref 6.0–8.5)
eGFR: 110 mL/min/{1.73_m2} (ref >59)

## 2021-06-13 LAB — CBC
Hematocrit: 47.9 % (ref 37.5–51.0)
Hemoglobin: 15.7 g/dL (ref 13.0–17.7)
MCH: 27.4 pg (ref 26.6–33.0)
MCHC: 32.8 g/dL (ref 31.5–35.7)
MCV: 84 fL (ref 79–97)
Platelets: 282 10*3/uL (ref 150–450)
RBC: 5.73 x10E6/uL (ref 4.14–5.80)
RDW: 14.5 % (ref 11.6–15.4)
WBC: 7.5 10*3/uL (ref 3.4–10.8)

## 2021-06-13 LAB — CBC WITH DIFFERENTIAL/PLATELET
Abs Immature Granulocytes: 0.03 10*3/uL (ref 0.00–0.07)
Basophils Absolute: 0.1 10*3/uL (ref 0.0–0.1)
Basophils Relative: 1 %
Eosinophils Absolute: 0.2 10*3/uL (ref 0.0–0.5)
Eosinophils Relative: 2 %
HCT: 44.9 % (ref 39.0–52.0)
Hemoglobin: 15.2 g/dL (ref 13.0–17.0)
Immature Granulocytes: 0 %
Lymphocytes Relative: 33 %
Lymphs Abs: 2.7 10*3/uL (ref 0.7–4.0)
MCH: 28 pg (ref 26.0–34.0)
MCHC: 33.9 g/dL (ref 30.0–36.0)
MCV: 82.7 fL (ref 80.0–100.0)
Monocytes Absolute: 0.6 10*3/uL (ref 0.1–1.0)
Monocytes Relative: 8 %
Neutro Abs: 4.7 10*3/uL (ref 1.7–7.7)
Neutrophils Relative %: 56 %
Platelets: 276 10*3/uL (ref 150–400)
RBC: 5.43 MIL/uL (ref 4.22–5.81)
RDW: 14.4 % (ref 11.5–15.5)
WBC: 8.2 10*3/uL (ref 4.0–10.5)
nRBC: 0 % (ref 0.0–0.2)

## 2021-06-13 LAB — COMPREHENSIVE METABOLIC PANEL
ALT: 61 U/L — ABNORMAL HIGH (ref 0–44)
AST: 38 U/L (ref 15–41)
Albumin: 4.1 g/dL (ref 3.5–5.0)
Alkaline Phosphatase: 51 U/L (ref 38–126)
Anion gap: 8 (ref 5–15)
BUN: 11 mg/dL (ref 6–20)
CO2: 29 mmol/L (ref 22–32)
Calcium: 8.7 mg/dL — ABNORMAL LOW (ref 8.9–10.3)
Chloride: 99 mmol/L (ref 98–111)
Creatinine, Ser: 0.98 mg/dL (ref 0.61–1.24)
GFR, Estimated: >60 mL/min (ref >60)
Glucose, Bld: 84 mg/dL (ref 70–99)
Potassium: 3.9 mmol/L (ref 3.5–5.1)
Sodium: 136 mmol/L (ref 135–145)
Total Bilirubin: 0.6 mg/dL (ref 0.3–1.2)
Total Protein: 7.3 g/dL (ref 6.5–8.1)

## 2021-06-13 LAB — LIPASE, BLOOD: Lipase: 31 U/L (ref 11–51)

## 2021-06-13 MED ORDER — ONDANSETRON HCL 4 MG/2ML IJ SOLN
4.0000 mg | Freq: Once | INTRAMUSCULAR | Status: AC
Start: 2021-06-13 — End: 2021-06-13
  Administered 2021-06-13: 4 mg via INTRAVENOUS
  Filled 2021-06-13: qty 2

## 2021-06-13 MED ORDER — LIDOCAINE 5 % EX PTCH
1.0000 | MEDICATED_PATCH | CUTANEOUS | Status: DC
  Administered 2021-06-13: 1 via TRANSDERMAL
  Filled 2021-06-13: qty 1

## 2021-06-13 MED ORDER — LIDOCAINE 5 % EX PTCH: 1.0000 | MEDICATED_PATCH | CUTANEOUS | 0 refills | Status: DC

## 2021-06-13 MED ORDER — MORPHINE SULFATE (PF) 4 MG/ML IV SOLN
4.0000 mg | Freq: Once | INTRAVENOUS | Status: AC
  Administered 2021-06-13: 4 mg via INTRAVENOUS
  Filled 2021-06-13: qty 1

## 2021-06-13 NOTE — ED Notes (Signed)
Patient transported to CT 

## 2021-06-13 NOTE — ED Triage Notes (Signed)
Pt c/o left flank pain x 3 days-states he was seen by PCP yesterday-had urine, blood and xray tests for kidney stone-no dx of kidney stone-states he had gastric bypass surgery last week-contacted surgeon's office-was advised to come to ED-NAD-steady gait

## 2021-06-13 NOTE — Telephone Encounter (Signed)
Pt called to share that he has been experiencing pain to his left side.  Pt described that pain to mostly located in the flank area "where my kidney would be".  Pt states he saw his PCP yesterday and they did blood work, a Insurance account manager, and a urine sample.  Per pt, PCP's nurse called and said that everything looked good, but pt states that he saw "red flags" on the AST and ALT but could not recall the specific values.  Pt states that pain medication is not helping pain and he is still concerned.  Pt instructed to call Dr. Ron Parker office to give update, share concern and follow any instructions that are given.  Will continue to monitor and f/u with pt as needed.

## 2021-06-13 NOTE — ED Provider Notes (Signed)
[facility] DE[facility] Provider Note   CSN: [id number] Arrival date & time: 06/13/21  1550     History Chief Complaint  Patient presents with   Flank Pain    [name] is a 35 y.o. male.  35 year old male presents with complaint of left flank pain x3 days with concern for kidney stone, no history of stones.  Patient states pain starts in his left mid back, radiates to left side of his abdomen, worse with palpation, no improvement with rest.  Went to PCP yesterday who did blood, urine and an abdominal x-ray without significant findings, no stone seen.  Patient had gastric bypass surgery on July 12, reports surgery went well and he has been recovering well, questions if taking the high-dose calcium pills postop has resulted in a kidney stone.  He reports nausea and vomiting, denies fevers, chills, changes in bowel or bladder habits.      Past Medical History:  Diagnosis Date   Abscess    Right forearm healing   ADHD (attention deficit hyperactivity disorder)    Anal fissure    Anxiety    Arthritis    back, shoulders, ankles, knees   Back pain    Bipolar disorder (HCC)    Chest pain    Chronic lower back pain    Constipation    Depression    Esophagitis    Distal esophageal erosions consistent with mild erosive  reflux esophagitis 12/2008 by EGD    External hemorrhoids    Gastric polyps    Gastric ulcer    GERD (gastroesophageal reflux disease)    Hepatic steatosis    Hiatal hernia    History of stomach ulcers    Hx MRSA infection 06/2007   right thigh   Hx of colonic polyps    Hypercholesteremia    denies   Hypothyroidism    Internal hemorrhoids    Irritable bowel syndrome (IBS)    Obesity    Occult GI bleeding 12/2008   Trivial upper GI bleed/uncontrolled GERD by upper endoscopy 12/2008, normal f/u endoscopy 03/2009 with SBCE at that time   OSA on CPAP    Pain management    Pneumonia 09/01/2015   "on ATB still" (09/04/2015)   S/P  colonoscopy 11/10, 10/08   Dr [name]   Schizophrenia [facility])    Sleep apnea    Stomach ulcer    Thyroid function test abnormal    Noted in 2011 discharge   Tobacco dipper    Wears contact lenses     Patient Active Problem List   Diagnosis Date Noted   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   Morbid obesity ([place]) 06/05/2021   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   Gastritis and gastroduodenitis    Gastroesophageal reflux disease with esophagitis without hemorrhage 12/01/2019   Belching    Gastric polyps    Hematochezia    Irritable bowel syndrome with diarrhea    Grade II hemorrhoids    Polyp of ascending colon    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   Seasonal and perennial allergic rhinitis 07/23/2014   Abdominal mass of other site 06/15/2012   Abdominal cramps 06/15/2012   Obstructive sleep apnea 05/03/2012   Syncope and collapse 05/03/2012   Class 3 severe obesity with serious comorbidity and body mass index (BMI) of 50.0 to 59.9 in adult ([place]) 05/03/2012   Anal fissure 05/27/2011   UNSPECIFIED ANEMIA 09/11/2009   Blood in stool 04/06/2009   Lower abdominal  pain 03/07/2009   BIPOLAR DISORDER UNSPECIFIED 03/06/2009   SMOKELESS TOBACCO ABUSE 03/06/2009   Attention deficit hyperactivity disorder (ADHD) 03/06/2009   GERD 03/06/2009   Diarrhea 03/06/2009    Past Surgical History:  Procedure Laterality Date   ANKLE FRACTURE SURGERY Left ~ 2008   BACK SURGERY     BIOPSY  11/02/2019   Procedure: BIOPSY;  Surgeon: Lavena Bullion, DO;  Location: [facility];  Service: Gastroenterology;;  EGD and Colon   BIOPSY  06/07/2020   Procedure: BIOPSY;  Surgeon: Lavena Bullion, DO;  Location: [facility];  Service: Gastroenterology;;   BRAVO [place] STUDY N/A 06/07/2020   Procedure: BRAVO [place] on PPI;  Surgeon: Lavena Bullion, DO;  Location: [facility];  Service: Gastroenterology;  Laterality: N/A;   COLONOSCOPY  10/10/2009   anal  papilla otherwise normal   COLONOSCOPY WITH PROPOFOL N/A 11/02/2019   Procedure: COLONOSCOPY WITH PROPOFOL;  Surgeon: Lavena Bullion, DO;  Location: [facility];  Service: Gastroenterology;  Laterality: N/A;   ESOPHAGOGASTRODUODENOSCOPY  04/07/2009   Normal esophagus, small hiatal hernia   ESOPHAGOGASTRODUODENOSCOPY  01/09/2009   Distal esophageal erosions consistent with mild erosive reflux esophagitis, otherwise normal esophagus, small hiatal herniaotherwise normal stomach, D1-D2    ESOPHAGOGASTRODUODENOSCOPY  08/26/2007   Normal esophagus, a small hiatal/hernia, otherwise normal stomach D1 through D3   ESOPHAGOGASTRODUODENOSCOPY (EGD) WITH PROPOFOL N/A 11/02/2019   Procedure: ESOPHAGOGASTRODUODENOSCOPY (EGD) WITH PROPOFOL;  Surgeon: Lavena Bullion, DO;  Location: [facility];  Service: Gastroenterology;  Laterality: N/A;   ESOPHAGOGASTRODUODENOSCOPY (EGD) WITH PROPOFOL N/A 06/07/2020   Procedure: ESOPHAGOGASTRODUODENOSCOPY (EGD) WITH PROPOFOL;  Surgeon: Lavena Bullion, DO;  Location: [facility];  Service: Gastroenterology;  Laterality: N/A;   FRACTURE SURGERY     GASTRIC BYPASS     GASTRIC ROUX-EN-Y N/A 06/05/2021   Procedure: LAPAROSCOPIC ROUX-EN-Y GASTRIC BYPASS WITH UPPER ENDOSCOPY;  Surgeon: Clovis Riley, MD;  Location: [facility];  Service: General;  Laterality: N/A;   ileocolonoscopy  08/26/2007    A normal rectum, colon, and terminal ileum   INCISION AND DRAINAGE  09/04/2015   "reopened my back incsion"   LUMBAR LAMINECTOMY/DECOMPRESSION MICRODISCECTOMY Left 09/23/2017   Procedure: Microdiscectomy - left - Lumbar four-Lumbar five;  Surgeon: Earnie Larsson, MD;  Location: [facility];  Service: Neurosurgery;  Laterality: Left;   LUMBAR MICRODISCECTOMY  08/21/2015   LUMBAR MICRODISCECTOMY Left 09/23/2017   L4-5   LUMBAR WOUND DEBRIDEMENT N/A 09/04/2015   Procedure: LUMBAR WOUND DEBRIDEMENT;  Surgeon: Consuella Lose, MD;  Location: [facility];  Service: Neurosurgery;   Laterality: N/A;   right side sugery     enlarged lymph node gland removed under arm pit age 35-5 years   Small bowel capsule  04/11/2009    normal throughout   VASECTOMY         Family History  Problem Relation Age of Onset   Leukemia Father 79   Colon polyps Father    Clotting disorder Father    Heart disease Father    Cancer Father    Depression Father    Sleep apnea Father    Obesity Father    Seizures Mother    Irritable bowel syndrome Mother    Thyroid disease Mother    Depression Mother    Anxiety disorder Mother    Bipolar disorder Brother    ADD / ADHD Brother    Diabetes Paternal Grandmother    Ulcerative colitis Paternal Aunt    Colon cancer Neg Hx  Liver cancer Neg Hx    Esophageal cancer Neg Hx    Stomach cancer Neg Hx    Pancreatic cancer Neg Hx     Social History   Tobacco Use   Smoking status: Never   Smokeless tobacco: Former    Types: Snuff    Quit date: 12/16/2020   Tobacco comments:    quitting snuff. whinning off   Vaping Use   Vaping Use: Never used  Substance Use Topics   Alcohol use: Not Currently   Drug use: No    Home Medications Prior to Admission medications   Medication Sig Start Date End Date Taking? Authorizing Provider  lidocaine (LIDODERM) 5 % Place 1 patch onto the skin daily. Remove & Discard patch within 12 hours or as directed by MD 06/13/21  Yes Tacy Learn, PA-C  amphetamine-dextroamphetamine (ADDERALL) 20 MG tablet Take 1 tablet (20 mg total) by mouth daily. To replace XR given recent bypass surgery 06/12/21   [name], DO  amphetamine-dextroamphetamine (ADDERALL) 20 MG tablet Take 1 tablet (20 mg total) by mouth daily. 08/10/21   [name], DO  amphetamine-dextroamphetamine (ADDERALL) 20 MG tablet Take 1 tablet (20 mg total) by mouth daily. 07/11/21   [name], DO  cetirizine (ZYRTEC) 10 MG tablet Take 10 mg by mouth daily.    [provider]  cyclobenzaprine (FLEXERIL) 10 MG  tablet Take 1 tablet by mouth three times daily as needed for muscle spasm Patient taking differently: Take 10 mg by mouth 3 (three) times daily as needed for muscle spasms. 04/10/21   [name], DO  docusate sodium (COLACE) 250 MG capsule Take 250 mg by mouth daily.    [provider]  enoxaparin (LOVENOX) 60 MG/0.6ML injection Inject 0.6 mLs (60 mg total) into the skin 2 (two) times daily for 14 days. 06/06/21 06/21/21  Clovis Riley, MD  EPINEPHrine 0.3 mg/0.3 mL IJ SOAJ injection Inject 0.3 mg into the muscle as needed for anaphylaxis.    [provider]  EUTHYROX 75 MCG tablet TAKE 1 TABLET BY MOUTH ONCE DAILY BEFORE BREAKFAST Patient taking differently: Take 75 mcg by mouth daily before breakfast. 04/16/21   Ronnie Doss M, DO  gabapentin (NEURONTIN) 300 MG capsule TAKE 1 CAPSULE BY MOUTH THREE TIMES DAILY 05/25/21   Ronnie Doss M, DO  lamoTRIgine (LAMICTAL) 25 MG tablet Take 2 tablets by mouth twice daily Patient taking differently: Take 50 mg by mouth 2 (two) times daily. 04/10/21   [name], DO  ondansetron (ZOFRAN-ODT) 4 MG disintegrating tablet Dissolve 1 tablet (4 mg total) by mouth every 6 (six) hours as needed for nausea or vomiting. 06/06/21   Clovis Riley, MD  oxyCODONE (OXY IR/ROXICODONE) 5 MG immediate release tablet Take 1 tablet (5 mg total) by mouth every 6 (six) hours as needed for severe pain. 06/06/21   Clovis Riley, MD  pantoprazole (PROTONIX) 40 MG tablet Take 1 tablet (40 mg total) by mouth 2 (two) times daily. **Take twice a day for 1 month after surgery, then decrease to once daily. 06/06/21   Clovis Riley, MD  Vitamin D, Ergocalciferol, (DRISDOL) 1.25 MG (50000 UNIT) CAPS capsule Take 1 capsule (50,000 Units total) by mouth every Thursday. 06/14/21   [name], DO    Allergies    Ibuprofen, Influenza vaccines, Tylenol [acetaminophen], Bee venom, and Tramadol  Review of Systems   Review of Systems   Constitutional:  Negative for chills, diaphoresis  and fever.  Respiratory:  Negative for shortness of breath.   Cardiovascular:  Negative for chest pain.  Gastrointestinal:  Positive for nausea and vomiting. Negative for abdominal distention, abdominal pain, constipation and diarrhea.  Genitourinary:  Positive for flank pain. Negative for difficulty urinating, dysuria and hematuria.  Musculoskeletal:  Positive for back pain. Negative for myalgias.  Skin:  Negative for rash and wound.  Allergic/Immunologic: Negative for immunocompromised state.  Neurological:  Negative for weakness and numbness.  Hematological:  Negative for adenopathy.  Psychiatric/Behavioral:  Negative for confusion.   All other systems reviewed and are negative.  Physical Exam Updated Vital Signs BP 111/63   Pulse 81   Temp 98.3 F (36.8 C) (Oral)   Resp 16   Ht 5\' 8"  (1.727 m)   Wt (!) 175.1 kg   SpO2 98%   BMI 58.69 kg/m   Physical Exam Vitals and nursing note reviewed.  Constitutional:      General: He is not in acute distress.    Appearance: He is well-developed. He is obese. He is not diaphoretic.  HENT:     Head: Normocephalic and atraumatic.  Cardiovascular:     Rate and Rhythm: Normal rate and regular rhythm.     Heart sounds: Normal heart sounds.  Pulmonary:     Effort: Pulmonary effort is normal.     Breath sounds: Normal breath sounds.  Abdominal:     Palpations: Abdomen is soft.     Tenderness: There is no abdominal tenderness. There is left CVA tenderness. There is no right CVA tenderness.  Musculoskeletal:        General: Tenderness present.     Right lower leg: No edema.     Left lower leg: No edema.     Comments: Left CVA tenderness as well as tenderness palpation left lumbar paraspinous.  Skin:    General: Skin is warm and dry.     Findings: No erythema or rash.  Neurological:     Mental Status: He is alert and oriented to person, place, and time.  Psychiatric:         Behavior: Behavior normal.    ED Results / Procedures / Treatments   Labs (all labs ordered are listed, but only abnormal results are displayed) Labs Reviewed  COMPREHENSIVE METABOLIC PANEL - Abnormal; Notable for the following components:      Result Value   Calcium 8.7 (*)    ALT 61 (*)    All other components within normal limits  URINALYSIS, ROUTINE W REFLEX MICROSCOPIC - Abnormal; Notable for the following components:   Bilirubin Urine SMALL (*)    Ketones, ur 15 (*)    All other components within normal limits  CBC WITH DIFFERENTIAL/PLATELET  LIPASE, BLOOD    EKG None  Radiology DG Abd 1 View  Result Date: 06/12/2021 CLINICAL DATA:  Left flank pain.  Recent gastric bypass surgery. EXAM: ABDOMEN - 1 VIEW COMPARISON:  CT scan 03/22/2020 FINDINGS: The bowel gas pattern is unremarkable. No findings for obstruction or perforation. The soft tissue shadows are maintained. No worrisome calcifications. Small pelvic calcifications appears stable and are likely phleboliths The bony structures are unremarkable. IMPRESSION: Unremarkable abdominal radiographs. Electronically Signed   By: Marijo Sanes M.D.   On: 06/12/2021 14:21   CT Renal Stone Study  Result Date: 06/13/2021 CLINICAL DATA:  Flank pain.  Gastric bypass 8 days ago. EXAM: CT ABDOMEN AND PELVIS WITHOUT CONTRAST TECHNIQUE: Multidetector CT imaging of the abdomen and pelvis was performed  following the standard protocol without IV contrast. COMPARISON:  CT 03/14/2020 FINDINGS: Lower chest: No acute airspace disease or pleural effusion. The heart is normal in size. Hepatobiliary: Diffusely decreased hepatic density consistent with steatosis. Enlarged liver spans 20 cm cranial caudal. No focal hepatic abnormality on this unenhanced exam. Gallbladder physiologically distended, no calcified stone. No biliary dilatation. Pancreas: No ductal dilatation or inflammation. Spleen: Normal in size without focal abnormality. Adrenals/Urinary  Tract: Normal adrenal glands. No hydronephrosis. No renal calculi. No significant perinephric edema. There is a 2.8 cm cyst in the medial lower left kidney. Decompressed ureters without stones along the course. Urinary bladder is near completely empty. No bladder stone. Stomach/Bowel: Post gastric bypass. The excluded gastric remnant is decompressed. The Roux limb is nondilated. There is no small bowel obstruction. No evidence of small bowel inflammation. Appendix is not definitively seen. Small volume of colonic stool without colonic wall thickening or inflammatory change. Vascular/Lymphatic: Normal caliber abdominal aorta. No mesenteric or portal venous gas. No abdominopelvic adenopathy. Reproductive: Prostate is unremarkable. Other: Postsurgical change in the anterior abdominal wall from recent laparoscopy. No subcutaneous collection. No free fluid or free air. No abdominopelvic abscess. Musculoskeletal: Mild L5-S1 degenerative disc disease. There are no acute or suspicious osseous abnormalities. IMPRESSION: 1. No renal stones or obstructive uropathy. 2. Post gastric bypass without complication. 3. Hepatomegaly and hepatic steatosis. Electronically Signed   By: Keith Rake M.D.   On: 06/13/2021 18:12    Procedures Procedures   Medications Ordered in ED Medications  lidocaine (LIDODERM) 5 % 1 patch (has no administration in time range)  ondansetron (ZOFRAN) injection 4 mg (4 mg Intravenous Given 06/13/21 1756)  morphine 4 MG/ML injection 4 mg (4 mg Intravenous Given 06/13/21 1757)    ED Course  I have reviewed the triage vital signs and the nursing notes.  Pertinent labs & imaging results that were available during my care of the patient were reviewed by me and considered in my medical decision making (see chart for details).  Clinical Course as of 06/13/21 1837  Wed Jun 13, 6761  1828 35 year old male with complaint of left flank pain as above.  On exam, has left-sided CVA tenderness as well  as left paraspinous lumbar tenderness.  Skin is normal in this area.  Does have bruising to abdomen due to recent gastric bypass surgery, op sites appear to be healing well without signs of infection. CT scan shows renal cyst, discussed with patient, PCP can monitor.  CT also with known hepatic steatosis.  Labs otherwise reassuring CBC unremarkable CMP with mildly elevated ALT otherwise unremarkable, urinalysis with small bilirubin and ketones. Pain is improved with Zofran and morphine in the ED, plan is to apply Lidoderm patch and will give prescription for same to use if needed. Advised can use topical Voltaren if needed.  Recommend recheck with PCP. [LM]    Clinical Course User Index [LM] Roque Lias   MDM Rules/Calculators/A&P                           Final Clinical Impression(s) / ED Diagnoses Final diagnoses:  Flank pain  Hepatic steatosis    Rx / DC Orders ED Discharge Orders          Ordered    lidocaine (LIDODERM) 5 %  Every 24 hours        06/13/21 1833             Tacy Learn,  PA-C 06/13/21 Clovia Cuff, MD 06/13/21 2348

## 2021-06-13 NOTE — ED Notes (Signed)
Pt discharged to home. Discharge instructions have been discussed with patient and/or family members. Pt verbally acknowledges understanding d/c instructions, and endorses comprehension to checkout at registration before leaving.  °

## 2021-06-13 NOTE — Discharge Instructions (Addendum)
Apply Lidoderm patch as needed as prescribed.  Can also use Voltaren gel, this is available over-the-counter.  Follow-up with your primary care provider if pain continues.

## 2021-06-19 ENCOUNTER — Encounter: Payer: 59 | Attending: Surgery | Admitting: Skilled Nursing Facility1

## 2021-06-19 ENCOUNTER — Other Ambulatory Visit: Payer: Self-pay

## 2021-06-19 DIAGNOSIS — Z6841 Body Mass Index (BMI) 40.0 and over, adult: Secondary | ICD-10-CM | POA: Insufficient documentation

## 2021-06-19 NOTE — Progress Notes (Signed)
2 Week Post-Operative Nutrition Class   Patient was seen on 06/19/2021 for Post-Operative Nutrition education at the [facility].     Surgery date: 06/05/2021 Surgery type: RYGB Start weight at [facility]: 398 pounds Weight today: 374.2 pounds Bowel Habits: Every day to every other day no complaints   Body Composition Scale 06/19/2021  Current Body Weight 374.2  Total Body Fat % 42.4  Visceral Fat 38  Fat-Free Mass % 57.5   Total Body Water % 38.5  Muscle-Mass lbs 70.7  BMI 55.1  Body Fat Displacement          Torso  lbs 98.6         Left Leg  lbs 19.7         Right Leg  lbs 19.7         Left Arm  lbs 9.8         Right Arm   lbs 9.8      The following the learning objectives were met by the patient during this course: Identifies Phase 3 (Soft, High Proteins) Dietary Goals and will begin from 2 weeks post-operatively to 2 months post-operatively Identifies appropriate sources of fluids and proteins  Identifies appropriate fat sources and healthy verses unhealthy fat types   States protein recommendations and appropriate sources post-operatively Identifies the need for appropriate texture modifications, mastication, and bite sizes when consuming solids Identifies appropriate fat consumption and sources Identifies appropriate multivitamin and calcium sources post-operatively Describes the need for physical activity post-operatively and will follow MD recommendations States when to call healthcare provider regarding medication questions or post-operative complications   Handouts given during class include: Phase 3A: Soft, High Protein Diet Handout Phase 3 High Protein Meals Healthy Fats   Follow-Up Plan: Patient will follow-up at [facility] in 6 weeks for 2 month post-op nutrition visit for diet advancement per MD.

## 2021-06-25 ENCOUNTER — Telehealth: Payer: Self-pay | Admitting: Skilled Nursing Facility1

## 2021-06-25 NOTE — Telephone Encounter (Signed)
RD called pt to verify fluid intake once starting soft, solid proteins 2 week post-bariatric surgery.   Daily Fluid intake: 64+ oz Daily Protein intake: 80 g Bowel Habits: every other day without issue  Concerns/issues:   None stated

## 2021-07-02 ENCOUNTER — Ambulatory Visit: Payer: 59 | Admitting: Family Medicine

## 2021-07-04 ENCOUNTER — Ambulatory Visit (INDEPENDENT_AMBULATORY_CARE_PROVIDER_SITE_OTHER): Payer: 59 | Admitting: Family Medicine

## 2021-07-04 ENCOUNTER — Encounter: Payer: Self-pay | Admitting: Family Medicine

## 2021-07-04 DIAGNOSIS — J019 Acute sinusitis, unspecified: Secondary | ICD-10-CM

## 2021-07-04 MED ORDER — AMOXICILLIN 875 MG PO TABS
875.0000 mg | ORAL_TABLET | Freq: Two times a day (BID) | ORAL | 0 refills | Status: AC
Start: 2021-07-04 — End: 2021-07-14

## 2021-07-04 NOTE — Progress Notes (Signed)
   Virtual Visit  Note Due to COVID-19 pandemic this visit was conducted virtually. This visit type was conducted due to national recommendations for restrictions regarding the COVID-19 Pandemic (e.g. social distancing, sheltering in place) in an effort to limit this patient's exposure and mitigate transmission in our community. All issues noted in this document were discussed and addressed.  A physical exam was not performed with this format.  I connected with [name] on 07/04/21 at 1137 by telephone and verified that I am speaking with the correct person using two identifiers. [name] is currently located in his truck and no one is currently with him during the visit. The provider, Gwenlyn Perking, FNP is located in their office at time of visit.  I discussed the limitations, risks, security and privacy concerns of performing an evaluation and management service by telephone and the availability of in person appointments. I also discussed with the patient that there may be a patient responsible charge related to this service. The patient expressed understanding and agreed to proceed.  CC: congestion  History and Present Illness:  HPI Javarous reports congestion and cough x 10 days. He reports that he is coughing up green/yellow phlegm. He is having pressure around his right ear. He has used tylenol cold and sinus and mucinex without improvement. He reports that his symptoms have been about the same since they started. Denies fever, sore throat, chest pain, shortness of breath, wheezing, body aches, or chills. He has not had a Covid test.     ROS As per HPI.   Observations/Objective: Alert and oriented x 3. Able to speak in full sentences without difficulty.   Assessment and Plan: Ayham was seen today for nasal congestion.  Diagnoses and all orders for this visit:  Acute non-recurrent sinusitis, unspecified location Cough and congestion x 10 days without  improvement despite OTC treatment. Amoxicillin ordered. Stay well hydrated. Return to office for new or worsening symptoms, or if symptoms persist.  -     amoxicillin (AMOXIL) 875 MG tablet; Take 1 tablet (875 mg total) by mouth 2 (two) times daily for 10 days.    Follow Up Instructions: Return to office for new or worsening symptoms, or if symptoms persist.     I discussed the assessment and treatment plan with the patient. The patient was provided an opportunity to ask questions and all were answered. The patient agreed with the plan and demonstrated an understanding of the instructions.   The patient was advised to call back or seek an in-person evaluation if the symptoms worsen or if the condition fails to improve as anticipated.  The above assessment and management plan was discussed with the patient. The patient verbalized understanding of and has agreed to the management plan. Patient is aware to call the clinic if symptoms persist or worsen. Patient is aware when to return to the clinic for a follow-up visit. Patient educated on when it is appropriate to go to the emergency department.   Time call ended:  1151  I provided 12 minutes of  non face-to-face time during this encounter.    Gwenlyn Perking, FNP

## 2021-07-10 ENCOUNTER — Ambulatory Visit (INDEPENDENT_AMBULATORY_CARE_PROVIDER_SITE_OTHER): Payer: 59 | Admitting: Family Medicine

## 2021-07-10 DIAGNOSIS — F902 Attention-deficit hyperactivity disorder, combined type: Secondary | ICD-10-CM | POA: Diagnosis not present

## 2021-07-10 DIAGNOSIS — Z9884 Bariatric surgery status: Secondary | ICD-10-CM

## 2021-07-10 NOTE — Progress Notes (Signed)
Telephone visit  Subjective: CC: ADHD PCP: Phillip Norlander, DO [id number] [name] is a 35 y.o. male calls for telephone consult today. Patient provides verbal consent for consult held via phone.  Due to COVID-19 pandemic this visit was conducted virtually. This visit type was conducted due to national recommendations for restrictions regarding the COVID-19 Pandemic (e.g. social distancing, sheltering in place) in an effort to limit this patient's exposure and mitigate transmission in our community. All issues noted in this document were discussed and addressed.  A physical exam was not performed with this format.   Location of patient: work Research scientist (medical) of provider: [facility] Others present for call: none  1.  ADHD Patient reports that he has been doing well from an ADHD standpoint.  He still suffers from some constipation but no more or less that he had been with extended release Adderall.  He feels that is working to control his ADHD and voices no other concerning symptoms or signs.  2.  History of gastric bypass surgery Continues to do well.  No reports of concerning symptoms.  Has not weighed recently but reports feeling well   ROS: Per HPI  Allergies  Allergen Reactions   Ibuprofen Other (See Comments)    Makes ulcers bleed   Influenza Vaccines Shortness Of Breath and Other (See Comments)    Rash and unable to breathe well   Tylenol [Acetaminophen] Hives   Bee Venom Swelling    SWELLING REACTION UNSPECIFIED    Tramadol Itching   Past Medical History:  Diagnosis Date   Abscess    Right forearm healing   ADHD (attention deficit hyperactivity disorder)    Anal fissure    Anxiety    Arthritis    back, shoulders, ankles, knees   Back pain    Bipolar disorder (HCC)    Chest pain    Chronic lower back pain    Constipation    Depression    Esophagitis    Distal esophageal erosions consistent with mild erosive  reflux esophagitis 12/2008 by EGD    External  hemorrhoids    Gastric polyps    Gastric ulcer    GERD (gastroesophageal reflux disease)    Hepatic steatosis    Hiatal hernia    History of stomach ulcers    Hx MRSA infection 06/2007   right thigh   Hx of colonic polyps    Hypercholesteremia    denies   Hypothyroidism    Internal hemorrhoids    Irritable bowel syndrome (IBS)    Obesity    Occult GI bleeding 12/2008   Trivial upper GI bleed/uncontrolled GERD by upper endoscopy 12/2008, normal f/u endoscopy 03/2009 with [facility] at that time   OSA on CPAP    Pain management    Pneumonia 09/01/2015   "on ATB still" (09/04/2015)   S/P colonoscopy 11/10, 10/08   [name]   Schizophrenia [facility])    Sleep apnea    Stomach ulcer    Thyroid function test abnormal    Noted in 2011 discharge   Tobacco dipper    Wears contact lenses     Current Outpatient Medications:    amoxicillin (AMOXIL) 875 MG tablet, Take 1 tablet (875 mg total) by mouth 2 (two) times daily for 10 days., Disp: 20 tablet, Rfl: 0   amphetamine-dextroamphetamine (ADDERALL) 20 MG tablet, Take 1 tablet (20 mg total) by mouth daily. To replace XR given recent bypass surgery, Disp: 30 tablet, Rfl: 0   [START ON 08/10/2021]  amphetamine-dextroamphetamine (ADDERALL) 20 MG tablet, Take 1 tablet (20 mg total) by mouth daily., Disp: 30 tablet, Rfl: 0   [START ON 07/11/2021] amphetamine-dextroamphetamine (ADDERALL) 20 MG tablet, Take 1 tablet (20 mg total) by mouth daily., Disp: 30 tablet, Rfl: 0   cetirizine (ZYRTEC) 10 MG tablet, Take 10 mg by mouth daily., Disp: , Rfl:    cyclobenzaprine (FLEXERIL) 10 MG tablet, Take 1 tablet by mouth three times daily as needed for muscle spasm (Patient taking differently: Take 10 mg by mouth 3 (three) times daily as needed for muscle spasms.), Disp: 90 tablet, Rfl: 3   docusate sodium (COLACE) 250 MG capsule, Take 250 mg by mouth daily., Disp: , Rfl:    enoxaparin (LOVENOX) 60 MG/0.6ML injection, Inject 0.6 mLs (60 mg total) into the skin 2  (two) times daily for 14 days., Disp: 16.8 mL, Rfl: 0   EPINEPHrine 0.3 mg/0.3 mL IJ SOAJ injection, Inject 0.3 mg into the muscle as needed for anaphylaxis., Disp: , Rfl:    EUTHYROX 75 MCG tablet, TAKE 1 TABLET BY MOUTH ONCE DAILY BEFORE BREAKFAST (Patient taking differently: Take 75 mcg by mouth daily before breakfast.), Disp: 90 tablet, Rfl: 2   gabapentin (NEURONTIN) 300 MG capsule, TAKE 1 CAPSULE BY MOUTH THREE TIMES DAILY, Disp: 90 capsule, Rfl: 1   lamoTRIgine (LAMICTAL) 25 MG tablet, Take 2 tablets by mouth twice daily (Patient taking differently: Take 50 mg by mouth 2 (two) times daily.), Disp: 120 tablet, Rfl: 2   lidocaine (LIDODERM) 5 %, Place 1 patch onto the skin daily. Remove & Discard patch within 12 hours or as directed by MD, Disp: 30 patch, Rfl: 0   ondansetron (ZOFRAN-ODT) 4 MG disintegrating tablet, Dissolve 1 tablet (4 mg total) by mouth every 6 (six) hours as needed for nausea or vomiting., Disp: 20 tablet, Rfl: 0   oxyCODONE (OXY IR/ROXICODONE) 5 MG immediate release tablet, Take 1 tablet (5 mg total) by mouth every 6 (six) hours as needed for severe pain., Disp: 15 tablet, Rfl: 0   pantoprazole (PROTONIX) 40 MG tablet, Take 1 tablet (40 mg total) by mouth 2 (two) times daily. **Take twice a day for 1 month after surgery, then decrease to once daily., Disp: 180 tablet, Rfl: 0   Vitamin D, Ergocalciferol, (DRISDOL) 1.25 MG (50000 UNIT) CAPS capsule, Take 1 capsule (50,000 Units total) by mouth every Thursday., Disp: 12 capsule, Rfl: 1  Assessment/ Plan: 35 y.o. male   Attention deficit hyperactivity disorder (ADHD), combined type  S/P gastric bypass  Continue Adderall IR 20 mg daily.  Not having any concerning symptoms or signs which is reassuring.    Has not weighed recently to determine degree of weight loss since our last visit but seems to be doing well  He will follow-up in 2 months for ongoing refills, sooner if needed    Start time: 12:16pm End time:  12:21pm  Total time spent on patient care (including telephone call/ virtual visit): 5 minutes  [place], [name] [phone]

## 2021-07-12 ENCOUNTER — Other Ambulatory Visit: Payer: Self-pay | Admitting: Family Medicine

## 2021-07-19 ENCOUNTER — Other Ambulatory Visit: Payer: Self-pay | Admitting: Family Medicine

## 2021-07-19 DIAGNOSIS — F3177 Bipolar disorder, in partial remission, most recent episode mixed: Secondary | ICD-10-CM

## 2021-07-31 ENCOUNTER — Encounter: Payer: Medicaid Other | Attending: Surgery | Admitting: Skilled Nursing Facility1

## 2021-07-31 ENCOUNTER — Other Ambulatory Visit: Payer: Self-pay

## 2021-07-31 NOTE — Progress Notes (Signed)
Bariatric Nutrition Follow-Up Visit Medical Nutrition Therapy   2 Months Post-Operative RYGB Surgery   NUTRITION ASSESSMENT    Surgery date: 06/05/2021 Surgery type: RYGB Start weight at [facility]: 398 pounds Weight today: 337.2 pounds  Clinical  Medical hx: schizophrenia  Medications: reduced protonix  Labs:  Notable signs/symptoms: reflux Any previous deficiencies? Vitamin D   Body Composition Scale 06/19/2021 07/31/2021  Current Body Weight 374.2 337.2  Total Body Fat % 42.4 39.9  Visceral Fat 38 33  Fat-Free Mass % 57.5 60   Total Body Water % 38.5 41  Muscle-Mass lbs 70.7 63.7  BMI 55.1 49.6  Body Fat Displacement           Torso  lbs 98.6 83.6         Left Leg  lbs 19.7 16.7         Right Leg  lbs 19.7 16.7         Left Arm  lbs 9.8 8.3         Right Arm   lbs 9.8 8.3     Lifestyle & Dietary Hx  Pt states he has been alternating between constipation and diarrhea stating he tried a laxative a couple times.   Pt states eggs do not sit well.  Pt states he has had a couple job interviews which he is excited about. Pt states his urine is an orange color: dietitian advised pt to speak with his doctor about that.  Pt states his energy level has been great and never needs a nap. Pt states he is very excited to add in his non starchy vegetables.   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    24-Hr Dietary Recall First Meal: bacon + sausage + cheese  Snack: cheese + ham roll up  Second Meal: tuna  Snack: plant based cookie [name] and Engineer, manufacturing) Third Meal: pork chop or chicken + beans Snack: unsweet applesauce  Beverages: water  Post-Op Goals/ Signs/ Symptoms Using straws: no Drinking while eating: no Chewing/swallowing difficulties: no Changes in vision: no Changes to mood/headaches: no Hair loss/changes to  skin/nails: no Difficulty focusing/concentrating: no Sweating: no Dizziness/lightheadedness: no Palpitations: no  Carbonated/caffeinated beverages: no N/V/D/C/Gas: no Abdominal pain: no Dumping syndrome: no    NUTRITION DIAGNOSIS  Overweight/obesity ([place]-3.3) related to past poor dietary habits and physical inactivity as evidenced by completed bariatric surgery and following dietary guidelines for continued weight loss and healthy nutrition status.     NUTRITION INTERVENTION Nutrition counseling (C-1) and education (E-2) to facilitate bariatric surgery goals, including: Diet advancement to the next phase (phase 4) now including non starchy vegetables  The importance of consuming adequate calories as well as certain nutrients daily due to the body's need for essential vitamins, minerals, and fats The importance of daily physical activity and to reach a goal of at least 150 minutes of moderate to vigorous physical activity weekly (or as directed by their physician) due to benefits such as increased musculature and improved lab values The importance of intuitive eating specifically learning hunger-satiety cues and understanding the importance of learning a new body: The importance of mindful eating to avoid grazing behaviors   Goals: -Continue to aim for a minimum of 64 fluid ounces 7 days a week with at least 30 ounces being plain water  -Eat non-starchy vegetables 2 times a day 7 days a week  -  Start out with soft cooked vegetables today and tomorrow; if tolerated begin to eat raw vegetables or cooked including salads  -Eat your 3 ounces of protein first then start in on your non-starchy vegetables; once you understand how much of your meal leads to satisfaction and not full while still eating 3 ounces of protein and non-starchy vegetables you can eat them in any order   -Continue to aim for 30 minutes of activity at least 5 times a week  -Do NOT cook with/add to your food: alfredo  sauce, cheese sauce, barbeque sauce, ketchup, fat back, butter, bacon grease, grease, Crisco, OR SUGAR   Handouts Provided Include  Phase 4  Learning Style & Readiness for Change Teaching method utilized: Visual & Auditory  Demonstrated degree of understanding via: Teach Back  Readiness Level: no Barriers to learning/adherence to lifestyle change: no  RD's Notes for Next Visit Assess adherence to pt chosen goals    MONITORING & EVALUATION Dietary intake, weekly physical activity, body weight  Next Steps Patient is to follow-up in 3-4 months

## 2021-08-02 ENCOUNTER — Other Ambulatory Visit: Payer: Self-pay | Admitting: Family Medicine

## 2021-08-02 DIAGNOSIS — M5126 Other intervertebral disc displacement, lumbar region: Secondary | ICD-10-CM

## 2021-08-08 ENCOUNTER — Other Ambulatory Visit: Payer: Self-pay | Admitting: Family Medicine

## 2021-08-08 DIAGNOSIS — M5126 Other intervertebral disc displacement, lumbar region: Secondary | ICD-10-CM

## 2021-08-22 ENCOUNTER — Other Ambulatory Visit: Payer: Self-pay | Admitting: Family Medicine

## 2021-08-22 DIAGNOSIS — F3177 Bipolar disorder, in partial remission, most recent episode mixed: Secondary | ICD-10-CM

## 2021-09-03 NOTE — Progress Notes (Signed)
HPI  male nonsmoker followed for OSA, allergic rhinitis, complicated by history of bipolar disorder, GERD, obesity NPSG 08/03/12-  AHI 24.1/ hr, moderate OSA.  ==================================================================================  11/14/2015-35 year old male nonsmoker followed for OSA, allergic rhinitis, complicated by history of bipolar disorder, GERD, obesity CPAP 17/ Advanced  FOLLOWS FOR: DME [facility]. Pt states using CPAP machine  every night for at least 5 hours per night. Pt states excessive sleepiness lately that he feels is medication related  CPAP indicates adequate compliance, better recently, with excellent control at 17 He describes his main sleep problem now is difficulty initiating sleep. Bedtime around 9:30 or 10 PM but he may not fall asleep before 1 AM, watching TV. His goal is to wake between 7 and 8 AM but his wife often needs to wake him. He expects to get a job starting at the first of the year, which would put him back on a regular schedule. Right now he doesn't do much but sit around and get drowsy if not engaged. Sometimes falls asleep and forgets to put CPAP on. Still complains some of dry mouth despite adjustment of humidifier by home care. Biotene not "cost-effective". Works with a Teacher, music using telemedicine. That doctor had suggested repeat sleep study and evaluation for narcolepsy. An MSLT would not be interpretable on his Behavioral Health medicines. The first issue is to see if he is using his CPAP enough and getting enough nighttime sleep.  03/14/2016-35 year old male nonsmoker followed for OSA, allergic rhinitis, complicated by history of bipolar disorder, GERD, obesity CPAP 20/Advanced FOLLOWS FOR: Pt has not been using CPAP since he recently moved-CPAP is packed away. He moved about 10 days ago and has not unpacked his CPAP machine yet. Says he doesn't sleep as well without it. Humidifier runs dry and was doing so before we raised pressure/flow. He is  going to see if home care can't provide a larger humidifier reservoir for this machine. 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  09/04/21- Virtual Visit via Telephone Note  I connected with [name] on 09/04/21 at 10:30 AM EDT by telephone and verified that I am speaking with the correct person using two identifiers.  Location: Patient: home Provider: office   I discussed the limitations, risks, security and privacy concerns of performing an evaluation and management service by telephone and the availability of in person appointments. I also discussed with the patient that there may be a patient responsible charge related to this service. The patient expressed understanding and agreed to proceed.   History of Present Illness: 35 year old male nonsmoker followed for OSA, allergic rhinitis, complicated by history of bipolar disorder, Schizophrenia, ADHD, GERD, obesity, Allergic Rhinitis, IBS, Hypothyroid, Bariatric Surgery 2022 ([facility]), -Adderall 20     CPAP 20/Advanced Download-compliance 17%, AHI 2.8/ hr Body weight today- Covid vax-none Flu vax-none "allergic" Has lst 92 lbs since Bariatic Surgery in July. Now sleeps better w/o CPAP and suggests we re-study him.   Observations/Objective:   Assessment and Plan:   Follow Up Instructions:    I discussed the assessment and treatment plan with the patient. The patient was provided an opportunity to ask questions and all were answered. The patient agreed with the plan and demonstrated an understanding of the instructions.   The patient was advised to call back or seek an in-person evaluation if the symptoms worsen or if the condition fails to improve as anticipated.  I provided 21 minutes of non-face-to-face time during  this encounter.   Baird Lyons, MD   ROS-see HPI   + = positive Constitutional:   No-   weight loss, night sweats, fevers, chills, fatigue,  lassitude. HEENT:   No-  headaches, difficulty swallowing, tooth/dental problems, sore throat,       No-  sneezing, itching, ear ache, +nasal congestion, post nasal drip,  CV:  No-  cardiac chest pain, orthopnea, PND, swelling in lower extremities, anasarca,  dizziness, palpitations Resp: + shortness of breath with exertion or at rest.              No-   productive cough,  No non-productive cough,  No- coughing up of blood.              No-   change in color of mucus.  No- wheezing.   Skin: No-   rash or lesions. GI:  + heartburn, indigestion, no-abdominal pain, nausea, vomiting,  GU:  MS:  No-   joint pain or swelling. + Back pain Neuro-     nothing unusual Psych:   See HPI-No- change in mood or affect. + depression or anxiety.  No memory loss.  OBJ- Physical Exam General- Alert, Oriented, Affect-appropriate, Distress- none acute, + morbidly obese Skin- rash-none, lesions- none, excoriation- none Lymphadenopathy- none Head- atraumatic            Eyes- Gross vision intact, PERRLA, conjunctivae and secretions clear            Ears- Hearing, canals-normal            Nose- Clear, no-Septal dev, mucus, polyps, erosion, perforation             Throat- Mallampati III , mucosa-brown stained, not dry , drainage- none, tonsils- 2+ Neck- flexible , trachea midline, no stridor , thyroid nl, carotid no bruit Chest - symmetrical excursion , unlabored           Heart/CV- RRR , no murmur , no gallop  , no rub, nl s1 s2                           - JVD- none , edema- none, stasis changes- none, varices- none           Lung- clear to P&A, wheeze- none, cough- none , dullness-none, rub- none           Chest wall-  Abd-  Br/ Gen/ Rectal- Not done, not indicated Extrem- cyanosis- none, clubbing, none, atrophy- none, strength- nl Neuro- grossly intact to observation

## 2021-09-04 ENCOUNTER — Encounter: Payer: Self-pay | Admitting: Internal Medicine

## 2021-09-04 ENCOUNTER — Other Ambulatory Visit: Payer: Self-pay

## 2021-09-04 ENCOUNTER — Ambulatory Visit (INDEPENDENT_AMBULATORY_CARE_PROVIDER_SITE_OTHER): Payer: 59 | Admitting: Internal Medicine

## 2021-09-04 VITALS — BP 117/75 | Wt 322.0 lb

## 2021-09-04 DIAGNOSIS — G4733 Obstructive sleep apnea (adult) (pediatric): Secondary | ICD-10-CM | POA: Diagnosis not present

## 2021-09-04 NOTE — Patient Instructions (Signed)
Order- schedule split night sleep study at [facility] disorders center   dx OSA  I'm proud of the weight loss since your bariatric surgery. Keep up the good work!  You can call us about 2 weeks after your sleep study for results and recommendations.

## 2021-09-04 NOTE — Assessment & Plan Note (Signed)
With his reported 92 lb weight loss we anticipate a number of changes. Hopefully he can keep the weight odd.

## 2021-09-04 NOTE — Assessment & Plan Note (Signed)
After significant weight loss he notes better sleep off CPAP. Plan - split night sleep study to reasses

## 2021-09-12 ENCOUNTER — Other Ambulatory Visit: Payer: Self-pay | Admitting: Family Medicine

## 2021-09-12 DIAGNOSIS — F902 Attention-deficit hyperactivity disorder, combined type: Secondary | ICD-10-CM

## 2021-09-19 ENCOUNTER — Other Ambulatory Visit: Payer: Self-pay | Admitting: Family Medicine

## 2021-09-19 DIAGNOSIS — F3177 Bipolar disorder, in partial remission, most recent episode mixed: Secondary | ICD-10-CM

## 2021-09-19 DIAGNOSIS — M5126 Other intervertebral disc displacement, lumbar region: Secondary | ICD-10-CM

## 2021-09-19 DIAGNOSIS — E559 Vitamin D deficiency, unspecified: Secondary | ICD-10-CM

## 2021-09-19 NOTE — Telephone Encounter (Signed)
Pt called about his Adderall refill He was seen in Aug and should have 3 refills. I informed him that in the visit notes for Aug that he was to be seen again in 2 mos for refills. He began to become loud and said that if there was communication between the back and check out as to when he needed to be seen again and said that he did make a follow-up appointment but was told there was not one in the computer. He has an appt for 10/30/21 and is out of medicine. I offered him an appt for the first available that I could do which would be 11/8, and he was still not happy with this saying he can not be without his medicine and he would like a call back from Dr. Lajuana Ripple when she returns to the office but if he did not get a call then he knows we do not do what we are suppose to

## 2021-09-24 ENCOUNTER — Encounter: Payer: Self-pay | Admitting: Family Medicine

## 2021-09-24 ENCOUNTER — Ambulatory Visit (INDEPENDENT_AMBULATORY_CARE_PROVIDER_SITE_OTHER): Payer: Medicaid Other | Admitting: Family Medicine

## 2021-09-24 ENCOUNTER — Other Ambulatory Visit: Payer: Self-pay

## 2021-09-24 VITALS — BP 114/78 | HR 82 | Temp 97.7°F | Ht 69.0 in | Wt 324.2 lb

## 2021-09-24 DIAGNOSIS — Z9884 Bariatric surgery status: Secondary | ICD-10-CM

## 2021-09-24 DIAGNOSIS — E559 Vitamin D deficiency, unspecified: Secondary | ICD-10-CM | POA: Diagnosis not present

## 2021-09-24 DIAGNOSIS — E039 Hypothyroidism, unspecified: Secondary | ICD-10-CM

## 2021-09-24 DIAGNOSIS — Z79899 Other long term (current) drug therapy: Secondary | ICD-10-CM | POA: Diagnosis not present

## 2021-09-24 DIAGNOSIS — G8929 Other chronic pain: Secondary | ICD-10-CM | POA: Diagnosis not present

## 2021-09-24 DIAGNOSIS — F902 Attention-deficit hyperactivity disorder, combined type: Secondary | ICD-10-CM | POA: Diagnosis not present

## 2021-09-24 DIAGNOSIS — M25511 Pain in right shoulder: Secondary | ICD-10-CM | POA: Diagnosis not present

## 2021-09-24 DIAGNOSIS — R7989 Other specified abnormal findings of blood chemistry: Secondary | ICD-10-CM

## 2021-09-24 MED ORDER — AMPHETAMINE-DEXTROAMPHETAMINE 20 MG PO TABS: 20.0000 mg | ORAL_TABLET | Freq: Every day | ORAL | 0 refills | Status: DC

## 2021-09-24 NOTE — Addendum Note (Signed)
Addended by: [name] on: 09/24/2021 09:58 AM   Modules accepted: Orders

## 2021-09-24 NOTE — Patient Instructions (Signed)

## 2021-09-24 NOTE — Progress Notes (Addendum)
Subjective: VW:PVXY PCP: [name], DO IAX:[id number] B Brancato is a 35 y.o. male presenting to clinic today for:  1.  ADHD Patient reports that he is unfortunately lapsed his medication last 2 weeks due to issues with scheduling an appointment.  Prior to that time symptoms have been well controlled.  2.  Hypothyroidism Compliant with thyroid replacement medication.  No reports of tremor, heart palpitations or change in bowel habit.  He has chronic alternating diarrhea and constipation.  3.  Status post gastric surgery Patient has an appointment tomorrow with his surgeon.  He is compliant with multivitamins twice daily, calcium 3 times daily and once weekly vitamin D prescription.  He has been feeling well.  Denies any vomiting, abdominal pain.  4.  Chronic right shoulder pain Patient reports ongoing chronic right shoulder pain.  He has limited active range of motion in raising his right arm.  He has been seen by Dr. Micheline Chapman at sports medicine center and noted to have arthritis of the right [facility] joint.  He has had corticosteroid injections but symptoms have not significantly improved.  He would like to go ahead and see a Psychologist, sport and exercise for further discussion.  ROS: Per HPI  Allergies  Allergen Reactions   Ibuprofen Other (See Comments)    Makes ulcers bleed   Influenza Vaccines Shortness Of Breath and Other (See Comments)    Rash and unable to breathe well   Tylenol [Acetaminophen] Hives   Bee Venom Swelling    SWELLING REACTION UNSPECIFIED    Tramadol Itching   Past Medical History:  Diagnosis Date   Abscess    Right forearm healing   ADHD (attention deficit hyperactivity disorder)    Anal fissure    Anxiety    Arthritis    back, shoulders, ankles, knees   Back pain    Bipolar disorder (HCC)    Chest pain    Chronic lower back pain    Constipation    Depression    Esophagitis    Distal esophageal erosions consistent with mild erosive  reflux esophagitis 12/2008 by EGD     External hemorrhoids    Gastric polyps    Gastric ulcer    GERD (gastroesophageal reflux disease)    Hepatic steatosis    Hiatal hernia    History of stomach ulcers    Hx MRSA infection 06/2007   right thigh   Hx of colonic polyps    Hypercholesteremia    denies   Hypothyroidism    Internal hemorrhoids    Irritable bowel syndrome (IBS)    Obesity    Occult GI bleeding 12/2008   Trivial upper GI bleed/uncontrolled GERD by upper endoscopy 12/2008, normal f/u endoscopy 03/2009 with SBCE at that time   OSA on CPAP    Pain management    Pneumonia 09/01/2015   "on ATB still" (09/04/2015)   S/P colonoscopy 11/10, 10/08   Dr [name]   Schizophrenia [facility])    Sleep apnea    Stomach ulcer    Thyroid function test abnormal    Noted in 2011 discharge   Tobacco dipper    Wears contact lenses     Current Outpatient Medications:    amphetamine-dextroamphetamine (ADDERALL) 20 MG tablet, Take 1 tablet (20 mg total) by mouth daily. To replace XR given recent bypass surgery, Disp: 30 tablet, Rfl: 0   amphetamine-dextroamphetamine (ADDERALL) 20 MG tablet, Take 1 tablet (20 mg total) by mouth daily., Disp: 30 tablet, Rfl: 0   amphetamine-dextroamphetamine (ADDERALL)  20 MG tablet, Take 1 tablet (20 mg total) by mouth daily., Disp: 30 tablet, Rfl: 0   cetirizine (ZYRTEC) 10 MG tablet, Take 10 mg by mouth daily., Disp: , Rfl:    cyclobenzaprine (FLEXERIL) 10 MG tablet, Take 1 tablet by mouth three times daily as needed for muscle spasm, Disp: 90 tablet, Rfl: 0   docusate sodium (COLACE) 250 MG capsule, Take 250 mg by mouth daily., Disp: , Rfl:    EPINEPHrine 0.3 mg/0.3 mL IJ SOAJ injection, Inject 0.3 mg into the muscle as needed for anaphylaxis., Disp: , Rfl:    EUTHYROX 75 MCG tablet, TAKE 1 TABLET BY MOUTH ONCE DAILY BEFORE BREAKFAST (Patient taking differently: Take 75 mcg by mouth daily before breakfast.), Disp: 90 tablet, Rfl: 2   gabapentin (NEURONTIN) 300 MG capsule, TAKE 1 CAPSULE BY  MOUTH THREE TIMES DAILY, Disp: 90 capsule, Rfl: 0   lamoTRIgine (LAMICTAL) 25 MG tablet, Take 2 tablets (50 mg total) by mouth 2 (two) times daily., Disp: 120 tablet, Rfl: 0   ondansetron (ZOFRAN-ODT) 4 MG disintegrating tablet, Dissolve 1 tablet (4 mg total) by mouth every 6 (six) hours as needed for nausea or vomiting., Disp: 20 tablet, Rfl: 0   pantoprazole (PROTONIX) 40 MG tablet, Take 1 tablet (40 mg total) by mouth 2 (two) times daily. **Take twice a day for 1 month after surgery, then decrease to once daily., Disp: 180 tablet, Rfl: 0   Vitamin D, Ergocalciferol, (DRISDOL) 1.25 MG (50000 UNIT) CAPS capsule, TAKE 1 CAPSULE BY MOUTH ONCE A WEEK EVERY  THURSDAY, Disp: 12 capsule, Rfl: 4 Social History   Socioeconomic History   Marital status: Married    Spouse name: Tabitha   Number of children: 2   Years of education: Not on file   Highest education level: Not on file  Occupational History   Occupation: Firefighter    Employer: [employer]   Occupation: truck Geophysicist/field seismologist  Tobacco Use   Smoking status: Never   Smokeless tobacco: Former    Types: Snuff    Quit date: 12/16/2020   Tobacco comments:    quitting snuff. whinning off   Vaping Use   Vaping Use: Never used  Substance and Sexual Activity   Alcohol use: Not Currently   Drug use: No   Sexual activity: Not Currently  Other Topics Concern   Not on file  Social History Narrative   Not on file   Social Determinants of Health   Financial Resource Strain: Not on file  Food Insecurity: Not on file  Transportation Needs: Not on file  Physical Activity: Not on file  Stress: Not on file  Social Connections: Not on file  Intimate Partner Violence: Not on file   Family History  Problem Relation Age of Onset   Leukemia Father 44   Colon polyps Father    Clotting disorder Father    Heart disease Father    Cancer Father    Depression Father    Sleep apnea Father    Obesity Father    Seizures Mother    Irritable bowel  syndrome Mother    Thyroid disease Mother    Depression Mother    Anxiety disorder Mother    Bipolar disorder Brother    ADD / ADHD Brother    Diabetes Paternal Grandmother    Ulcerative colitis Paternal Aunt    Colon cancer Neg Hx    Liver cancer Neg Hx    Esophageal cancer Neg Hx    Stomach  cancer Neg Hx    Pancreatic cancer Neg Hx     Objective: Office vital signs reviewed. BP 114/78   Pulse 82   Temp 97.7 F (36.5 C)   Ht '5\' 9"'  (1.753 m)   Wt (!) 324 lb 3.2 oz (147.1 kg)   SpO2 99%   BMI 47.88 kg/m   Physical Examination:  General: Awake, alert, well appearing, No acute distress HEENT: Normal; no exophthalmos.  No goiter Cardio: regular rate and rhythm, S1S2 heard, no murmurs appreciated Pulm: clear to auscultation bilaterally, no wheezes, rhonchi or rales; normal work of breathing on room air MSK: normal gait and station.  Right shoulder with painful arc sign.  Has pain limiting AB duction to about 90 degrees. Neuro: Somewhat ornery  Depression screen [facility] 2/9 09/24/2021 06/12/2021 04/03/2021  Decreased Interest 1 1 0  Down, Depressed, Hopeless 0 1 0  PHQ - 2 Score 1 2 0  Altered sleeping 0 0 -  Tired, decreased energy 0 1 -  Change in appetite 0 0 -  Feeling bad or failure about yourself  0 0 -  Trouble concentrating 1 0 -  Moving slowly or fidgety/restless 0 0 -  Suicidal thoughts 0 - -  PHQ-9 Score 2 3 -  Difficult doing work/chores Not difficult at all Not difficult at all -  Some recent data might be hidden   GAD 7 : Generalized Anxiety Score 09/24/2021 06/12/2021  Nervous, Anxious, on Edge 1 1  Control/stop worrying 0 0  Worry too much - different things 0 0  Trouble relaxing 0 1  Restless 0 0  Easily annoyed or irritable 1 1  Afraid - awful might happen 0 0  Total GAD 7 Score 2 3  Anxiety Difficulty Not difficult at all -    Assessment/ Plan: 35 y.o. male   Attention deficit hyperactivity disorder (ADHD), combined type - Plan: ToxASSURE Select  13 (MW), Urine, amphetamine-dextroamphetamine (ADDERALL) 20 MG tablet, amphetamine-dextroamphetamine (ADDERALL) 20 MG tablet, amphetamine-dextroamphetamine (ADDERALL) 20 MG tablet  Controlled substance agreement signed - Plan: ToxASSURE Select 13 (MW), Urine  S/P gastric bypass - Plan: VITAMIN D 25 Hydroxy (Vit-D Deficiency, Fractures), CMP14+EGFR, Lipid Panel, Vitamin B12, Ferritin, Iron  Acquired hypothyroidism - Plan: TSH, T4, Free  Vitamin D deficiency - Plan: VITAMIN D 25 Hydroxy (Vit-D Deficiency, Fractures)  Elevated liver function tests - Plan: CMP14+EGFR, Lipid Panel  Chronic right shoulder pain - Plan: Ambulatory referral to Orthopedic Surgery  ADHD is not controlled currently due to lapse in medication.  National narcotic database reviewed and there were no red flags.  UDS and controlled substance contract updated as per office policy.  Medications have been renewed x3 months and I personally went with him to have that appointment scheduled in 10 weeks and that he does not have another lapse in medication  We will collect iron, B12, lipid, CMP and vitamin D levels.  Will CC chart to surgeon.  Asymptomatic from a thyroid standpoint.  Check thyroid levels  Repeat lipid panel, LFTs given history of elevated liver function test and fatty liver noted  Referral to orthopedics placed in [place] as per his request.  No orders of the defined types were placed in this encounter.  No orders of the defined types were placed in this encounter.    [name], DO [place] [phone]

## 2021-09-24 NOTE — Addendum Note (Signed)
Addended by: Everlean Cherry on: 09/24/2021 10:56 AM   Modules accepted: Orders

## 2021-09-25 LAB — CMP14+EGFR
ALT: 19 IU/L (ref 0–44)
AST: 21 IU/L (ref 0–40)
Albumin/Globulin Ratio: 2 (ref 1.2–2.2)
Albumin: 4.4 g/dL (ref 4.0–5.0)
Alkaline Phosphatase: 91 IU/L (ref 44–121)
BUN/Creatinine Ratio: 10 (ref 9–20)
BUN: 9 mg/dL (ref 6–20)
Bilirubin Total: 0.6 mg/dL (ref 0.0–1.2)
CO2: 21 mmol/L (ref 20–29)
Calcium: 10 mg/dL (ref 8.7–10.2)
Chloride: 102 mmol/L (ref 96–106)
Creatinine, Ser: 0.87 mg/dL (ref 0.76–1.27)
Globulin, Total: 2.2 g/dL (ref 1.5–4.5)
Glucose: 90 mg/dL (ref 70–99)
Potassium: 4.6 mmol/L (ref 3.5–5.2)
Sodium: 142 mmol/L (ref 134–144)
Total Protein: 6.6 g/dL (ref 6.0–8.5)
eGFR: 115 mL/min/{1.73_m2} (ref >59)

## 2021-09-25 LAB — LIPID PANEL
Chol/HDL Ratio: 3.2 ratio (ref 0.0–5.0)
Cholesterol, Total: 140 mg/dL (ref 100–199)
HDL: 44 mg/dL (ref >39)
LDL Chol Calc (NIH): 77 mg/dL (ref 0–99)
Triglycerides: 104 mg/dL (ref 0–149)
VLDL Cholesterol Cal: 19 mg/dL (ref 5–40)

## 2021-09-25 LAB — TSH: TSH: 2.37 u[IU]/mL (ref 0.450–4.500)

## 2021-09-25 LAB — IRON: Iron: 104 ug/dL (ref 38–169)

## 2021-09-25 LAB — VITAMIN B12: Vitamin B-12: 519 pg/mL (ref 232–1245)

## 2021-09-25 LAB — VITAMIN D 25 HYDROXY (VIT D DEFICIENCY, FRACTURES): Vit D, 25-Hydroxy: 71.4 ng/mL (ref 30.0–100.0)

## 2021-09-25 LAB — FERRITIN: Ferritin: 74 ng/mL (ref 30–400)

## 2021-09-25 LAB — T4, FREE: Free T4: 1.09 ng/dL (ref 0.82–1.77)

## 2021-09-28 ENCOUNTER — Encounter: Payer: 59 | Admitting: Internal Medicine

## 2021-09-28 LAB — DRUG SCREEN 10 W/CONF, SERUM
Amphetamines, IA: NEGATIVE ng/mL
Barbiturates, IA: NEGATIVE ug/mL
Benzodiazepines, IA: NEGATIVE ng/mL
Cocaine & Metabolite, IA: NEGATIVE ng/mL
Methadone, IA: NEGATIVE ng/mL
Opiates, IA: NEGATIVE ng/mL
Oxycodones, IA: NEGATIVE ng/mL
Phencyclidine, IA: NEGATIVE ng/mL
Propoxyphene, IA: NEGATIVE ng/mL
THC(Marijuana) Metabolite, IA: NEGATIVE ng/mL

## 2021-10-03 ENCOUNTER — Encounter: Payer: Self-pay | Admitting: Orthopedic Surgery

## 2021-10-03 ENCOUNTER — Ambulatory Visit (INDEPENDENT_AMBULATORY_CARE_PROVIDER_SITE_OTHER): Payer: Medicaid Other | Admitting: Orthopedic Surgery

## 2021-10-03 ENCOUNTER — Other Ambulatory Visit: Payer: Self-pay

## 2021-10-03 VITALS — BP 133/84 | HR 102 | Ht 70.0 in | Wt 320.0 lb

## 2021-10-03 DIAGNOSIS — Z6841 Body Mass Index (BMI) 40.0 and over, adult: Secondary | ICD-10-CM | POA: Diagnosis not present

## 2021-10-03 DIAGNOSIS — M25511 Pain in right shoulder: Secondary | ICD-10-CM | POA: Diagnosis not present

## 2021-10-03 NOTE — Progress Notes (Signed)
New Patient Visit  Assessment: [name] is a 35 y.o. male with the following: 1. Arthralgia of right acromioclavicular joint  Plan: Patient has had pain in the right AC joint for the past 18 months.  Pain has been progressively worsening.  He previously had steroid injections in the [facility] joint, which provided 100% relief of his pain, but this was not sustained.  At this point in time, he has pain with overhead motion.  It is limiting his regular activities.  He has difficulty with some of his requirements at work.  He is interested in a distal clavicle excision, if this is beneficial.  We will obtain an MRI for further evaluation, for preoperative planning.  Once the MRIs been completed, we will see him in clinic to discuss findings.  Continue with his current medications as needed.  The patient meets the AMA guidelines for Morbid obesity with BMI > 40.  The patient has been counseled on weight loss.     Follow-up: Return for After MRI.  Subjective:  Chief Complaint  Patient presents with   Shoulder Pain    RT shoulder Injured about a year and a half ago, fell from truck    History of Present Illness: [name] is a 35 y.o. male who presents for evaluation of right shoulder pain.  He has been referred to clinic today by Ronnie Doss, DO.  He states he had pain in the superior aspect of his right shoulder for the past 18 months.  He states he lost his balance, while getting into his truck at work, and essentially fell onto his right shoulder.  At that time, the pain is minimal.  However, it progressively worsened.  He has been evaluated by a sports medicine, nonoperative provider, and received 2 ultrasound-guided injections directly into the [facility] joint.  The first 1 lasted several months.  The second 1 lasted 1-2 weeks.  He continues to have difficulty with overhead motion.  The pain is always in the same spot.  He is unable to take NSAIDs due to history of bleeding  ulcers.  Tylenol and Voltaren gel has not provided sufficient relief.  He has worked with physical therapy, with limited improvement in his symptoms.   Review of Systems: No fevers or chills No numbness or tingling No chest pain No shortness of breath No bowel or bladder dysfunction No GI distress No headaches   Medical History:  Past Medical History:  Diagnosis Date   Abscess    Right forearm healing   ADHD (attention deficit hyperactivity disorder)    Anal fissure    Anxiety    Arthritis    back, shoulders, ankles, knees   Back pain    Bipolar disorder (HCC)    Chest pain    Chronic lower back pain    Constipation    Depression    Esophagitis    Distal esophageal erosions consistent with mild erosive  reflux esophagitis 12/2008 by EGD    External hemorrhoids    Gastric polyps    Gastric ulcer    GERD (gastroesophageal reflux disease)    Hepatic steatosis    Hiatal hernia    History of stomach ulcers    Hx MRSA infection 06/2007   right thigh   Hx of colonic polyps    Hypercholesteremia    denies   Hypothyroidism    Internal hemorrhoids    Irritable bowel syndrome (IBS)    Obesity    Occult GI bleeding 12/2008  Trivial upper GI bleed/uncontrolled GERD by upper endoscopy 12/2008, normal f/u endoscopy 03/2009 with SBCE at that time   OSA on CPAP    Pain management    Pneumonia 09/01/2015   "on ATB still" (09/04/2015)   S/P colonoscopy 11/10, 10/08   Dr [name]   Schizophrenia [facility])    Sleep apnea    Stomach ulcer    Thyroid function test abnormal    Noted in 2011 discharge   Tobacco dipper    Wears contact lenses     Past Surgical History:  Procedure Laterality Date   ANKLE FRACTURE SURGERY Left ~ 2008   BACK SURGERY     BIOPSY  11/02/2019   Procedure: BIOPSY;  Surgeon: Lavena Bullion, DO;  Location: [facility];  Service: Gastroenterology;;  EGD and Colon   BIOPSY  06/07/2020   Procedure: BIOPSY;  Surgeon: Lavena Bullion, DO;   Location: [facility];  Service: Gastroenterology;;   Franki [place] STUDY N/A 06/07/2020   Procedure: BRAVO Kiron on PPI;  Surgeon: Lavena Bullion, DO;  Location: [facility];  Service: Gastroenterology;  Laterality: N/A;   COLONOSCOPY  10/10/2009   anal papilla otherwise normal   COLONOSCOPY WITH PROPOFOL N/A 11/02/2019   Procedure: COLONOSCOPY WITH PROPOFOL;  Surgeon: Lavena Bullion, DO;  Location: [facility];  Service: Gastroenterology;  Laterality: N/A;   ESOPHAGOGASTRODUODENOSCOPY  04/07/2009   Normal esophagus, small hiatal hernia   ESOPHAGOGASTRODUODENOSCOPY  01/09/2009   Distal esophageal erosions consistent with mild erosive reflux esophagitis, otherwise normal esophagus, small hiatal herniaotherwise normal stomach, D1-D2    ESOPHAGOGASTRODUODENOSCOPY  08/26/2007   Normal esophagus, a small hiatal/hernia, otherwise normal stomach D1 through D3   ESOPHAGOGASTRODUODENOSCOPY (EGD) WITH PROPOFOL N/A 11/02/2019   Procedure: ESOPHAGOGASTRODUODENOSCOPY (EGD) WITH PROPOFOL;  Surgeon: Lavena Bullion, DO;  [facility]: [facility];  Service: Gastroenterology;  Laterality: N/A;   ESOPHAGOGASTRODUODENOSCOPY (EGD) WITH PROPOFOL N/A 06/07/2020   Procedure: ESOPHAGOGASTRODUODENOSCOPY (EGD) WITH PROPOFOL;  Surgeon: Lavena Bullion, DO;  Location: [facility];  Service: Gastroenterology;  Laterality: N/A;   FRACTURE SURGERY     GASTRIC BYPASS     GASTRIC ROUX-EN-Y N/A 06/05/2021   Procedure: LAPAROSCOPIC ROUX-EN-Y GASTRIC BYPASS WITH UPPER ENDOSCOPY;  Surgeon: Clovis Riley, MD;  Location: [facility];  Service: General;  Laterality: N/A;   ileocolonoscopy  08/26/2007    A normal rectum, colon, and terminal ileum   INCISION AND DRAINAGE  09/04/2015   "reopened my back incsion"   LUMBAR LAMINECTOMY/DECOMPRESSION MICRODISCECTOMY Left 09/23/2017   Procedure: Microdiscectomy - left - Lumbar four-Lumbar five;  Surgeon: Earnie Larsson, MD;  Location: [place];  Service: Neurosurgery;   Laterality: Left;   LUMBAR MICRODISCECTOMY  08/21/2015   LUMBAR MICRODISCECTOMY Left 09/23/2017   L4-5   LUMBAR WOUND DEBRIDEMENT N/A 09/04/2015   Procedure: LUMBAR WOUND DEBRIDEMENT;  Surgeon: Consuella Lose, MD;  Location: [place] [facility];  Service: Neurosurgery;  Laterality: N/A;   right side sugery     enlarged lymph node gland removed under arm pit age 14-5 years   Small bowel capsule  04/11/2009    normal throughout   VASECTOMY      Family History  Problem Relation Age of Onset   Leukemia Father 54   Colon polyps Father    Clotting disorder Father    Heart disease Father    Cancer Father    Depression Father    Sleep apnea Father    Obesity Father    Seizures Mother    Irritable bowel  syndrome Mother    Thyroid disease Mother    Depression Mother    Anxiety disorder Mother    Bipolar disorder Brother    ADD / ADHD Brother    Diabetes Paternal Grandmother    Ulcerative colitis Paternal Aunt    Colon cancer Neg Hx    Liver cancer Neg Hx    Esophageal cancer Neg Hx    Stomach cancer Neg Hx    Pancreatic cancer Neg Hx    Social History   Tobacco Use   Smoking status: Never   Smokeless tobacco: Former    Types: Snuff    Quit date: 12/16/2020   Tobacco comments:    quitting snuff. whinning off   Vaping Use   Vaping Use: Never used  Substance Use Topics   Alcohol use: Not Currently   Drug use: No    Allergies  Allergen Reactions   Ibuprofen Other (See Comments)    Makes ulcers bleed   Influenza Vaccines Shortness Of Breath and Other (See Comments)    Rash and unable to breathe well   Tylenol [Acetaminophen] Hives   Bee Venom Swelling    SWELLING REACTION UNSPECIFIED    Tramadol Itching    Current Meds  Medication Sig   [START ON 11/24/2021] amphetamine-dextroamphetamine (ADDERALL) 20 MG tablet Take 1 tablet (20 mg total) by mouth daily. To replace XR given recent bypass surgery   [START ON 10/25/2021] amphetamine-dextroamphetamine (ADDERALL) 20 MG  tablet Take 1 tablet (20 mg total) by mouth daily.   amphetamine-dextroamphetamine (ADDERALL) 20 MG tablet Take 1 tablet (20 mg total) by mouth daily.   cetirizine (ZYRTEC) 10 MG tablet Take 10 mg by mouth daily.   cyclobenzaprine (FLEXERIL) 10 MG tablet Take 1 tablet by mouth three times daily as needed for muscle spasm   docusate sodium (COLACE) 250 MG capsule Take 250 mg by mouth daily.   EPINEPHrine 0.3 mg/0.3 mL IJ SOAJ injection Inject 0.3 mg into the muscle as needed for anaphylaxis.   EUTHYROX 75 MCG tablet TAKE 1 TABLET BY MOUTH ONCE DAILY BEFORE BREAKFAST (Patient taking differently: Take 75 mcg by mouth daily before breakfast.)   gabapentin (NEURONTIN) 300 MG capsule TAKE 1 CAPSULE BY MOUTH THREE TIMES DAILY   lamoTRIgine (LAMICTAL) 25 MG tablet Take 2 tablets (50 mg total) by mouth 2 (two) times daily.   ondansetron (ZOFRAN-ODT) 4 MG disintegrating tablet Dissolve 1 tablet (4 mg total) by mouth every 6 (six) hours as needed for nausea or vomiting.   pantoprazole (PROTONIX) 40 MG tablet Take 1 tablet (40 mg total) by mouth 2 (two) times daily. **Take twice a day for 1 month after surgery, then decrease to once daily.   Vitamin D, Ergocalciferol, (DRISDOL) 1.25 MG (50000 UNIT) CAPS capsule TAKE 1 CAPSULE BY MOUTH ONCE A WEEK EVERY  THURSDAY    Objective: BP 133/84   Pulse (!) 102   Ht 5\' 10"  (1.778 m)   Wt (!) 320 lb (145.2 kg)   BMI 45.92 kg/m   Physical Exam:  General: Alert and oriented. and No acute distress. Gait: Normal gait.  Obese male.  Evaluation of right shoulder demonstrates mild deformity at the [facility] joint.  Exquisite tenderness to palpation directly overlying the AC joint.  Pain in the [facility] joint with forward elevation to 140 degrees.  Pain with cross body abduction.  He has pain in the [facility] joint with strength testing, otherwise it is 4+/5.  Negative belly press.  Internal rotation limited to the lumbar  spine.  IMAGING: I personally reviewed images previously obtained  in clinic   X-ray of the right shoulder were previously obtained in clinic.  No acute injuries are noted.  He does have some signs of degenerative changes at the [facility] joint.  New Medications:  No orders of the defined types were placed in this encounter.     Mordecai Rasmussen, MD  10/03/2021 12:56 PM

## 2021-10-04 DIAGNOSIS — Z9884 Bariatric surgery status: Secondary | ICD-10-CM | POA: Diagnosis not present

## 2021-10-07 ENCOUNTER — Ambulatory Visit: Payer: Medicaid Other | Attending: Internal Medicine | Admitting: Internal Medicine

## 2021-10-07 ENCOUNTER — Other Ambulatory Visit: Payer: Self-pay

## 2021-10-07 DIAGNOSIS — R0683 Snoring: Secondary | ICD-10-CM | POA: Insufficient documentation

## 2021-10-07 DIAGNOSIS — G4733 Obstructive sleep apnea (adult) (pediatric): Secondary | ICD-10-CM | POA: Insufficient documentation

## 2021-10-11 ENCOUNTER — Other Ambulatory Visit: Payer: Self-pay

## 2021-10-11 ENCOUNTER — Ambulatory Visit (HOSPITAL_COMMUNITY)
Admission: RE | Admit: 2021-10-11 | Discharge: 2021-10-11 | Disposition: A | Discharge status: Home / Self Care | Payer: Medicaid Other | Source: Ambulatory Visit | Attending: Orthopedic Surgery | Admitting: Orthopedic Surgery

## 2021-10-11 DIAGNOSIS — M25511 Pain in right shoulder: Secondary | ICD-10-CM | POA: Diagnosis not present

## 2021-10-12 ENCOUNTER — Encounter: Payer: Self-pay | Admitting: Orthopedic Surgery

## 2021-10-12 ENCOUNTER — Ambulatory Visit (INDEPENDENT_AMBULATORY_CARE_PROVIDER_SITE_OTHER): Payer: Medicaid Other | Admitting: Orthopedic Surgery

## 2021-10-12 VITALS — Ht 70.0 in | Wt 317.0 lb

## 2021-10-12 DIAGNOSIS — M25511 Pain in right shoulder: Secondary | ICD-10-CM | POA: Diagnosis not present

## 2021-10-12 NOTE — Progress Notes (Signed)
Orthopaedic Clinic Return  Assessment: [name] is a 35 y.o. male with the following: Right AC joint arthritis   Plan: We reviewed the MRI in clinic today, which demonstrates degenerative changes at the [facility] joint, or as well as reactive bone edema within the acromion, as well as the distal clavicle.  He has had multiple previous injections, directly into the [facility] joint, with excellent relief, although it was not sustained.  He has pain directly over the [facility] joint.  He complains of pain with certain motions, only at the [facility] joint.  As a result, we discussed the possibility of proceeding with surgery.  He is interested in proceeding.  Based on the MRI, as well as his function, I do not think we will need to do anything to his rotator cuff.  I do think there is a small chance that we have to evaluate the biceps tendon, and possibly complete a biceps tenodesis.  Risks and benefits of the procedure were discussed with the patient including, but not limited to, infection, bleeding, damage to surrounding structure, recurrent pain, persistent deformity at the [facility] joint, and severe complications associated with anesthesia.  He is elected proceed.  We will confirm that he is cleared for surgery with Dr. Lajuana Ripple.  He would like to proceed with surgery November 22, 2021.  This will be an outpatient procedure.  Small chance that we will be open, but most likely will all be arthroscopic.  The patient meets the AMA guidelines for Morbid obesity with BMI > 40.  The patient has been counseled on weight loss.    Follow-up: Return for After surgery; 2 weeks postop DOS 11/22/21.   Subjective:  Chief Complaint  Patient presents with   Shoulder Pain    Rt shoulder pain    History of Present Illness: [name] is a 35 y.o. male who returns to clinic for repeat evaluation of his right shoulder.  He has had pain in the right shoulder for the past 18 months or so.  He has had multiple injections  in the [facility] joint, all provided excellent relief, with differing lengths.  The most recent injection lasted for less than a week.  He states that the pain in his shoulder was almost completely resolved while the injection was effective.  He has tried physical therapy, with limited improvement.  He has tried medications as well.  He presents today to review the findings of his recent MRI.  Review of Systems: No fevers or chills No numbness or tingling No chest pain No shortness of breath No bowel or bladder dysfunction No GI distress No headaches   Objective: Ht 5\' 10"  (1.778 m)   Wt (!) 317 lb (143.8 kg)   BMI 45.48 kg/m   Physical Exam:  Obese male.  No acute distress.  Alert and oriented.  Near full range of motion, with pain at the [facility] joint.  Tenderness palpation directly over the [facility] joint.  Pain with cross body abduction.  No tenderness to palpation over the biceps tendon.  Negative O'Brien's.  IMAGING: I personally ordered and reviewed the following images:  Right shoulder MRI    IMPRESSION: 1. Mild arthropathy of the [facility] joint. Prominent bone marrow edema within the acromion process and to a lesser degree within the distal clavicle. No well-defined fracture line. Findings may be reactive, reflect a bone contusion, or be secondary to stress reaction. 2. Moderate rotator cuff tendinosis without evidence of tear. 3. Mild intra-articular biceps tendinosis.  Mordecai Rasmussen, MD 10/12/2021 9:09 AM

## 2021-10-14 NOTE — Procedures (Signed)
   [place]   Patient Name: [name], [name] Date: 10/07/2021 Gender: Male D.O.B: 01-Sep-1986 Age (years): 82 Referring Provider: Baird Lyons MD, ABSM Height (inches): 71 Interpreting Physician: Baird Lyons MD, ABSM Weight (lbs): 320 RPSGT: Rosebud Poles BMI: 45 MRN: [id number] Neck Size: 19.00  CLINICAL INFORMATION Sleep Study Type: NPSG Indication for sleep study: OSA Epworth Sleepiness Score: 2  Most recent polysomnogram dated 03/25/2018 revealed an AHI of 19.1/h and RDI of 20.7/h. SLEEP STUDY TECHNIQUE As per the AASM Manual for the Scoring of Sleep and Associated Events v2.3 (April 2016) with a hypopnea requiring 4% desaturations.  The channels recorded and monitored were frontal, central and occipital EEG, electrooculogram (EOG), submentalis EMG (chin), nasal and oral airflow, thoracic and abdominal wall motion, anterior tibialis EMG, snore microphone, electrocardiogram, and pulse oximetry.  MEDICATIONS Medications self-administered by patient taken the night of the study : N/A  SLEEP ARCHITECTURE The study was initiated at 10:04:28 PM and ended at 4:54:58 AM.  Sleep onset time was 3.3 minutes and the sleep efficiency was 90.1%. The total sleep time was 370 minutes.  Stage REM latency was 74.0 minutes.  The patient spent 4.05% of the night in stage N1 sleep, 59.59% in stage N2 sleep, 20.41% in stage N3 and 16% in REM.  Alpha intrusion was absent.  Supine sleep was 100.00%.  RESPIRATORY PARAMETERS The overall apnea/hypopnea index (AHI) was 10.7 per hour. There were 3 total apneas, including 3 obstructive, 0 central and 0 mixed apneas. There were 63 hypopneas and 0 RERAs.  The AHI during Stage REM sleep was 45.8 per hour.  AHI while supine was 10.7 per hour.  The mean oxygen saturation was 93.59%. The minimum SpO2 during sleep was 60.00%.  loud snoring was noted during this study.  CARDIAC DATA The  2 lead EKG demonstrated sinus rhythm. The mean heart rate was 66.00 beats per minute. Other EKG findings include: None.  LEG MOVEMENT DATA The total PLMS were 546 with a resulting PLMS index of 88.54. Associated arousal with leg movement index was 4.5 .  IMPRESSIONS - Mild obstructive sleep apnea occurred during this study (AHI = 10.7/h). - Oxygen desaturation was noted during this study (Min O2 = 60.00%). MeanO2 saturation 93.5%. - The patient snored with loud snoring volume. - No cardiac abnormalities were noted during this study. - Severe periodic limb movements of sleep occurred during the study. Mild associated arousals.  DIAGNOSIS - Obstructive Sleep Apnea (G47.33) - Nocturnal Hypoxemia (G47.36)  RECOMMENDATIONS - Suggest CPAP titration sleep study or autopap. Other options would be based on clinical judgment. - Be careful with alcohol, sedatives and other CNS depressants that may worsen sleep apnea and disrupt normal sleep architecture. - Sleep hygiene should be reviewed to assess factors that may improve sleep quality. - Weight management and regular exercise should be initiated or continued if appropriate.  [Electronically signed] 10/14/2021 11:29 AM  Baird Lyons MD, ABSM Diplomate, American Board of Sleep Medicine   NPI: [id number]                         [place] [place] [facility]  ELECTRONICALLY SIGNED ON:  10/14/2021, 11:22 AM [place] PH: [phone]   FX: [phone] [place]

## 2021-10-17 ENCOUNTER — Encounter: Payer: Medicaid Other | Attending: Surgery | Admitting: Skilled Nursing Facility1

## 2021-10-17 ENCOUNTER — Other Ambulatory Visit: Payer: Self-pay

## 2021-10-17 NOTE — Progress Notes (Signed)
Bariatric Nutrition Follow-Up Visit Medical Nutrition Therapy   Post-Operative RYGB Surgery   NUTRITION ASSESSMENT    Surgery date: 06/05/2021 Surgery type: RYGB Start weight at [facility]: 398 pounds Weight today: 314.3 pounds  Clinical  Medical hx: schizophrenia, reflux Medications: reduced protonix  Labs:  Notable signs/symptoms:  Any previous deficiencies? Vitamin D   Body Composition Scale 06/19/2021 07/31/2021 10/17/2021  Current Body Weight 374.2 337.2 314.3  Total Body Fat % 42.4 39.9 38  Visceral Fat 38 33 30  Fat-Free Mass % 57.5 60 61.9   Total Body Water % 38.5 41 42.9  Muscle-Mass lbs 70.7 63.7 59.3  BMI 55.1 49.6 46.2  Body Fat Displacement            Torso  lbs 98.6 83.6 74.3         Left Leg  lbs 19.7 16.7 14.8         Right Leg  lbs 19.7 16.7 14.8         Left Arm  lbs 9.8 8.3 7.4         Right Arm   lbs 9.8 8.3 7.4     Lifestyle & Dietary Hx   Pt states he has realized he cannot take too big of bites of meat. Pt states since adding in non starchy vegetables is not as constipated but still struggles with diarrhea about 1-2 times a week. Pt states his sleep has been really good. Pt states he recently got another sleep study, finding not yet discussed.  Pt states he tried lifting weights but hurt his shoulder and will be getting surgery in december.  Pt states stopping at satisfaction has gone well and he continues to listen to his body.   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    24-Hr Dietary Recall First Meal: tenderloin + egg  Snack 9am: special k bar Second Meal 12:30-1: broccoli and cauliflower with lite ranch and cherry tamatos + pork chop or chicken Snack 3: nuts or trail mix Third Meal: pork chop or chicken + beans + broccoli or peas or green beans sometimes mashed potatoes Snack:  unsweet applesauce  Beverages: water, gatorade zero, dr pepper zero  Post-Op Goals/ Signs/ Symptoms Using straws: no Drinking while eating: no Chewing/swallowing difficulties: no Changes in vision: no Changes to mood/headaches: no Hair loss/changes to skin/nails: no Difficulty focusing/concentrating: no Sweating: no Dizziness/lightheadedness: no Palpitations: no  Carbonated/caffeinated beverages: no N/V/D/C/Gas: no Abdominal pain: no Dumping syndrome: no    NUTRITION DIAGNOSIS  Overweight/obesity ([place]-3.3) related to past poor dietary habits and physical inactivity as evidenced by completed bariatric surgery and following dietary guidelines for continued weight loss and healthy nutrition status.     NUTRITION INTERVENTION Nutrition counseling (C-1) and education (E-2) to facilitate bariatric surgery goals, including: Diet advancement to the next phase (phase 4) now including starchy vegetables  The importance of consuming adequate calories as well as certain nutrients daily due to the body's need for essential vitamins, minerals, and fats The importance of daily physical activity and to reach a goal of at least 150 minutes of moderate to vigorous physical activity weekly (or as directed by their physician) due to benefits such as increased musculature and improved lab values The importance of intuitive eating specifically learning hunger-satiety cues and understanding the importance of learning a new body: The importance of mindful eating to avoid grazing  behaviors     Handouts Provided Include  Phase 5  Learning Style & Readiness for Change Teaching method utilized: Visual & Auditory  Demonstrated degree of understanding via: Teach Back  Readiness Level: Action Barriers to learning/adherence to lifestyle change: none identified   RD's Notes for Next Visit Assess adherence to pt chosen goals    MONITORING & EVALUATION Dietary intake, weekly physical activity, body  weight  Next Steps Patient is to follow-up

## 2021-10-24 ENCOUNTER — Ambulatory Visit: Payer: Self-pay | Admitting: Orthopedic Surgery

## 2021-10-24 DIAGNOSIS — Z01818 Encounter for other preprocedural examination: Secondary | ICD-10-CM

## 2021-10-30 ENCOUNTER — Ambulatory Visit: Payer: 59 | Admitting: Family Medicine

## 2021-10-31 ENCOUNTER — Telehealth: Payer: Self-pay | Admitting: Family Medicine

## 2021-10-31 ENCOUNTER — Other Ambulatory Visit: Payer: Self-pay | Admitting: Family Medicine

## 2021-10-31 DIAGNOSIS — F3177 Bipolar disorder, in partial remission, most recent episode mixed: Secondary | ICD-10-CM

## 2021-10-31 NOTE — Telephone Encounter (Signed)
These were all sent in 09/24/2021 to [facility] pharmacy which is the pharmacy he requested.  Is [facility] out of the medication?

## 2021-10-31 NOTE — Telephone Encounter (Signed)
  Prescription Request  10/31/2021  Is this a "Controlled Substance" medicine? yes  Have you seen your PCP in the last 2 weeks? no  If YES, route message to pool  -  If NO, patient needs to be scheduled for appointment.  What is the name of the medication or equipment? amphetamine-dextroamphetamine (ADDERALL) 20 MG tablet  Have you contacted your pharmacy to request a refill? Yes - pharmacy is out and needs it sent to a different pharmacy    Which pharmacy would you like this sent to? [facility]    Patient notified that their request is being sent to the clinical staff for review and that they should receive a response within 2 business days.

## 2021-11-01 NOTE — Telephone Encounter (Signed)
Patient aware and verbalized understanding. He will call [facility] and check and see if they are out and he will call us back to let us know.

## 2021-11-07 ENCOUNTER — Other Ambulatory Visit: Payer: Self-pay | Admitting: Family Medicine

## 2021-11-07 DIAGNOSIS — M5126 Other intervertebral disc displacement, lumbar region: Secondary | ICD-10-CM

## 2021-11-15 NOTE — Patient Instructions (Signed)
WALDEMAR SIEGEL  11/15/2021     @PREFPERIOPPHARMACY @   Your procedure is scheduled on  11/22/2021.   Report to [facility] at  [phone]  A.M.   Call this number if you have problems the morning of surgery:  [phone]   Remember:  Do not eat after midnight.   You may drink clear liquids until  0330 AM .  Clear liquids allowed are:                    Water, Juice (non-citric and without pulp - diabetics please choose diet or no sugar options), Carbonated beverages - (diabetics please choose diet or no sugar options), Clear Tea, Black Coffee only (no creamer, milk or cream including half and half), Plain Jell-O only (diabetics please choose diet or no sugar options), Gatorade (diabetics please choose diet or no sugar options), and Plain Popsicles only     At 0330 AM drink your carb drink. You can not have anything else to drink after this.    Take these medicines the morning of surgery with A SIP OF WATER          zyrtec, flexeril (if needed), euthyrox, gabapentin, lamictal, zofran (if needed).     Do not wear jewelry, make-up or nail polish.  Do not wear lotions, powders, or perfumes, or deodorant.  Do not shave 48 hours prior to surgery.  Men may shave face and neck.  Do not bring valuables to the hospital.  [facility] is not responsible for any belongings or valuables.  Contacts, dentures or bridgework may not be worn into surgery.  Leave your suitcase in the car.  After surgery it may be brought to your room.  For patients admitted to the hospital, discharge time will be determined by your treatment team.  Patients discharged the day of surgery will not be allowed to drive home and must have someone with you for 24 hours.    Special instructions:   DO NOT smoke tobacco or vape for 24 hours before your procedure.  Please read over the following fact sheets that you were given. Coughing and Deep Breathing, Surgical Site Infection Prevention, Anesthesia  Post-op Instructions, and Care and Recovery After Surgery      Shoulder Arthroscopy, Care After The following information offers guidance on how to care for yourself after your procedure. Your health care provider may also give you more specific instructions. If you have problems or questions, contact your health care provider. What can I expect after the procedure? After the procedure, it is common to have: Pain. Swelling. A small amount of fluid from the incision. Stiffness that improves over time. Follow these instructions at home: If you have a sling or an immobilizer: Wear it as told by your health care provider. Remove it only as told by your health care provider. These devices protect your shoulder and help it heal by keeping it in place. Check the skin around it every day. Tell your health care provider about any concerns. Loosen it if your fingers tingle, become numb, or turn cold and blue. Keep the sling or immobilizer clean. If it is not waterproof: Do not let it get wet. Cover it with a watertight covering when you take a bath or a shower. Incision care  Follow instructions from your health care provider about how to take care of your incisions. Make sure you: Wash your hands with soap and water for at  least 20 seconds before and after you change your bandage (dressing). If soap and water are not available, use hand sanitizer. Change your dressing as told by your health care provider. Leave stitches (sutures), skin glue, or adhesive strips in place. These skin closures may need to stay in place for 2 weeks or longer. If adhesive strip edges start to loosen and curl up, you may trim the loose edges. Do not remove adhesive strips completely unless your health care provider tells you to do that. Check your incision areas every day for signs of infection. Check for: Redness. More swelling or pain. Blood or more fluid. Warmth. Pus or a bad smell. Do not take baths, swim, or  use a hot tub until your health care provider approves. Ask your health care provider if you may take showers. You may only be allowed to take sponge baths. Managing pain, stiffness, and swelling  If directed, put ice on the affected area. To do this: If you have a removable sling or immobilizer, remove it as told by your health care provider. Put ice in a plastic bag or use the icing device (cold therapy unit) that you were given. Follow instructions from your health care provider about how to use the icing device. Place a towel between your skin and the bag or between your skin and the icing device. Leave the ice on for 20 minutes, 2-3 times a day. Remove the ice if your skin turns bright red. This is very important. If you cannot feel pain, heat, or cold, you have a greater risk of damage to the area. Move your fingers often to reduce stiffness and swelling. Raise (elevate) the injured area above the level of your heart while you are lying down. It may help to sleep in a sitting position for a few days after your procedure. Try sleeping in a reclining chair or propping yourself up with extra pillows in bed. Activity Ask your health care provider what activities are safe for you during recovery. Do not lift with your affected shoulder until your health care provider approves. Avoid pulling and pushing with the arm on your affected side. If physical therapy was prescribed, do exercises as directed. Doing exercises may help to improve shoulder movement and flexibility (range of motion). Driving Ask your health care provider when it is safe to drive if you have a sling or immobilizer. Ask your health care provider if the medicine prescribed to you requires you to avoid driving or using machinery. General instructions Take over-the-counter and prescription medicines only as told by your health care provider. Ask your health care provider if the medicine prescribed to you can cause constipation.  You may need to take these actions to prevent or treat constipation: Drink enough fluid to keep your urine pale yellow. Take over-the-counter or prescription medicines. Eat foods that are high in fiber, such as beans, whole grains, and fresh fruits and vegetables. Limit foods that are high in fat and processed sugars, such as fried or sweet foods. Do not use any products that contain nicotine or tobacco. These products include cigarettes, chewing tobacco, and vaping devices, such as e-cigarettes. These can delay incision healing after surgery. If you need help quitting, ask your health care provider. Keep all follow-up visits. This is important. Contact a health care provider if: You have a fever or chills. You have severe pain. You have any of these signs of infection: Redness around an incision. More swelling or pain in an incision area.  Blood or more fluid coming from an incision. Warmth coming from an incision. Pus or a bad smell coming from an incision. You notice that an incision has opened up. You develop a rash. Get help right away if: You have difficulty breathing. You have chest pain. You notice that your fingers tingle, are numb, or are cold and blue even after you loosen your sling or immobilizer. You develop pain in your lower leg or at the back of your knee. These symptoms may represent a serious problem that is an emergency. Do not wait to see if the symptoms will go away. Get medical help right away. Call your local emergency services (911 in the [place] Do not drive yourself to the hospital. Summary If you have a sling or an immobilizer, wear it as told by your health care provider. It may help to sleep in a sitting position for a few days after your procedure. If physical therapy was prescribed, do exercises as directed. Doing exercises may help to improve shoulder movement and flexibility (range of motion). Keep all follow-up visits. This is important. This information  is not intended to replace advice given to you by your health care provider. Make sure you discuss any questions you have with your health care provider. Document Revised: 07/10/2020 Document Reviewed: 07/10/2020 Elsevier Patient Education  [place] Anesthesia, Adult, Care After This sheet gives you information about how to care for yourself after your procedure. Your health care provider may also give you more specific instructions. If you have problems or questions, contact your health care provider. What can I expect after the procedure? After the procedure, the following side effects are common: Pain or discomfort at the IV site. Nausea. Vomiting. Sore throat. Trouble concentrating. Feeling cold or chills. Feeling weak or tired. Sleepiness and fatigue. Soreness and body aches. These side effects can affect parts of the body that were not involved in surgery. Follow these instructions at home: For the time period you were told by your health care provider:  Rest. Do not participate in activities where you could fall or become injured. Do not drive or use machinery. Do not drink alcohol. Do not take sleeping pills or medicines that cause drowsiness. Do not make important decisions or sign legal documents. Do not take care of children on your own. Eating and drinking Follow any instructions from your health care provider about eating or drinking restrictions. When you feel hungry, start by eating small amounts of foods that are soft and easy to digest (bland), such as toast. Gradually return to your regular diet. Drink enough fluid to keep your urine pale yellow. If you vomit, rehydrate by drinking water, juice, or clear broth. General instructions If you have sleep apnea, surgery and certain medicines can increase your risk for breathing problems. Follow instructions from your health care provider about wearing your sleep device: Anytime you are sleeping,  including during daytime naps. While taking prescription pain medicines, sleeping medicines, or medicines that make you drowsy. Have a responsible adult stay with you for the time you are told. It is important to have someone help care for you until you are awake and alert. Return to your normal activities as told by your health care provider. Ask your health care provider what activities are safe for you. Take over-the-counter and prescription medicines only as told by your health care provider. If you smoke, do not smoke without supervision. Keep all follow-up visits as told by your health care provider.  This is important. Contact a health care provider if: You have nausea or vomiting that does not get better with medicine. You cannot eat or drink without vomiting. You have pain that does not get better with medicine. You are unable to pass urine. You develop a skin rash. You have a fever. You have redness around your IV site that gets worse. Get help right away if: You have difficulty breathing. You have chest pain. You have blood in your urine or stool, or you vomit blood. Summary After the procedure, it is common to have a sore throat or nausea. It is also common to feel tired. Have a responsible adult stay with you for the time you are told. It is important to have someone help care for you until you are awake and alert. When you feel hungry, start by eating small amounts of foods that are soft and easy to digest (bland), such as toast. Gradually return to your regular diet. Drink enough fluid to keep your urine pale yellow. Return to your normal activities as told by your health care provider. Ask your health care provider what activities are safe for you. This information is not intended to replace advice given to you by your health care provider. Make sure you discuss any questions you have with your health care provider. Document Revised: 07/27/2020 Document Reviewed:  02/24/2020 Elsevier Patient Education  2022 [place]. How to Use Chlorhexidine for Bathing Chlorhexidine gluconate (CHG) is a germ-killing (antiseptic) solution that is used to clean the skin. It can get rid of the bacteria that normally live on the skin and can keep them away for about 24 hours. To clean your skin with CHG, you may be given: A CHG solution to use in the shower or as part of a sponge bath. A prepackaged cloth that contains CHG. Cleaning your skin with CHG may help lower the risk for infection: While you are staying in the intensive care unit of the hospital. If you have a vascular access, such as a central line, to provide short-term or long-term access to your veins. If you have a catheter to drain urine from your bladder. If you are on a ventilator. A ventilator is a machine that helps you breathe by moving air in and out of your lungs. After surgery. What are the risks? Risks of using CHG include: A skin reaction. Hearing loss, if CHG gets in your ears and you have a perforated eardrum. Eye injury, if CHG gets in your eyes and is not rinsed out. The CHG product catching fire. Make sure that you avoid smoking and flames after applying CHG to your skin. Do not use CHG: If you have a chlorhexidine allergy or have previously reacted to chlorhexidine. On babies younger than 2 months of age. How to use CHG solution Use CHG only as told by your health care provider, and follow the instructions on the label. Use the full amount of CHG as directed. Usually, this is one bottle. During a shower Follow these steps when using CHG solution during a shower (unless your health care provider gives you different instructions): Start the shower. Use your normal soap and shampoo to wash your face and hair. Turn off the shower or move out of the shower stream. Pour the CHG onto a clean washcloth. Do not use any type of brush or rough-edged sponge. Starting at your neck, lather your  body down to your toes. Make sure you follow these instructions: If you will be  having surgery, pay special attention to the part of your body where you will be having surgery. Scrub this area for at least 1 minute. Do not use CHG on your head or face. If the solution gets into your ears or eyes, rinse them well with water. Avoid your genital area. Avoid any areas of skin that have broken skin, cuts, or scrapes. Scrub your back and under your arms. Make sure to wash skin folds. Let the lather sit on your skin for 1-2 minutes or as long as told by your health care provider. Thoroughly rinse your entire body in the shower. Make sure that all body creases and crevices are rinsed well. Dry off with a clean towel. Do not put any substances on your body afterward--such as powder, lotion, or perfume--unless you are told to do so by your health care provider. Only use lotions that are recommended by the manufacturer. Put on clean clothes or pajamas. If it is the night before your surgery, sleep in clean sheets.  During a sponge bath Follow these steps when using CHG solution during a sponge bath (unless your health care provider gives you different instructions): Use your normal soap and shampoo to wash your face and hair. Pour the CHG onto a clean washcloth. Starting at your neck, lather your body down to your toes. Make sure you follow these instructions: If you will be having surgery, pay special attention to the part of your body where you will be having surgery. Scrub this area for at least 1 minute. Do not use CHG on your head or face. If the solution gets into your ears or eyes, rinse them well with water. Avoid your genital area. Avoid any areas of skin that have broken skin, cuts, or scrapes. Scrub your back and under your arms. Make sure to wash skin folds. Let the lather sit on your skin for 1-2 minutes or as long as told by your health care provider. Using a different clean, wet washcloth,  thoroughly rinse your entire body. Make sure that all body creases and crevices are rinsed well. Dry off with a clean towel. Do not put any substances on your body afterward--such as powder, lotion, or perfume--unless you are told to do so by your health care provider. Only use lotions that are recommended by the manufacturer. Put on clean clothes or pajamas. If it is the night before your surgery, sleep in clean sheets. How to use CHG prepackaged cloths Only use CHG cloths as told by your health care provider, and follow the instructions on the label. Use the CHG cloth on clean, dry skin. Do not use the CHG cloth on your head or face unless your health care provider tells you to. When washing with the CHG cloth: Avoid your genital area. Avoid any areas of skin that have broken skin, cuts, or scrapes. Before surgery Follow these steps when using a CHG cloth to clean before surgery (unless your health care provider gives you different instructions): Using the CHG cloth, vigorously scrub the part of your body where you will be having surgery. Scrub using a back-and-forth motion for 3 minutes. The area on your body should be completely wet with CHG when you are done scrubbing. Do not rinse. Discard the cloth and let the area air-dry. Do not put any substances on the area afterward, such as powder, lotion, or perfume. Put on clean clothes or pajamas. If it is the night before your surgery, sleep in clean sheets.  For  general bathing Follow these steps when using CHG cloths for general bathing (unless your health care provider gives you different instructions). Use a separate CHG cloth for each area of your body. Make sure you wash between any folds of skin and between your fingers and toes. Wash your body in the following order, switching to a new cloth after each step: The front of your neck, shoulders, and chest. Both of your arms, under your arms, and your hands. Your stomach and groin area,  avoiding the genitals. Your right leg and foot. Your left leg and foot. The back of your neck, your back, and your buttocks. Do not rinse. Discard the cloth and let the area air-dry. Do not put any substances on your body afterward--such as powder, lotion, or perfume--unless you are told to do so by your health care provider. Only use lotions that are recommended by the manufacturer. Put on clean clothes or pajamas. Contact a health care provider if: Your skin gets irritated after scrubbing. You have questions about using your solution or cloth. You swallow any chlorhexidine. Call your local poison control center (1-404-462-5653 in the [place] Get help right away if: Your eyes itch badly, or they become very red or swollen. Your skin itches badly and is red or swollen. Your hearing changes. You have trouble seeing. You have swelling or tingling in your mouth or throat. You have trouble breathing. These symptoms may represent a serious problem that is an emergency. Do not wait to see if the symptoms will go away. Get medical help right away. Call your local emergency services (911 in the [place] Do not drive yourself to the hospital. Summary Chlorhexidine gluconate (CHG) is a germ-killing (antiseptic) solution that is used to clean the skin. Cleaning your skin with CHG may help to lower your risk for infection. You may be given CHG to use for bathing. It may be in a bottle or in a prepackaged cloth to use on your skin. Carefully follow your health care provider's instructions and the instructions on the product label. Do not use CHG if you have a chlorhexidine allergy. Contact your health care provider if your skin gets irritated after scrubbing. This information is not intended to replace advice given to you by your health care provider. Make sure you discuss any questions you have with your health care provider. Document Revised: 01/22/2021 Document Reviewed: 01/22/2021 Elsevier Patient  Education  2022 [employer].

## 2021-11-20 ENCOUNTER — Other Ambulatory Visit: Payer: Self-pay

## 2021-11-20 ENCOUNTER — Encounter (HOSPITAL_COMMUNITY)
Admission: RE | Admit: 2021-11-20 | Discharge: 2021-11-20 | Disposition: A | Discharge status: Home / Self Care | Payer: Medicaid Other | Source: Ambulatory Visit | Attending: Orthopedic Surgery | Admitting: Orthopedic Surgery

## 2021-11-20 DIAGNOSIS — Z01818 Encounter for other preprocedural examination: Secondary | ICD-10-CM | POA: Diagnosis not present

## 2021-11-20 LAB — BASIC METABOLIC PANEL
Anion gap: 8 (ref 5–15)
BUN: 12 mg/dL (ref 6–20)
CO2: 25 mmol/L (ref 22–32)
Calcium: 8.7 mg/dL — ABNORMAL LOW (ref 8.9–10.3)
Chloride: 102 mmol/L (ref 98–111)
Creatinine, Ser: 0.75 mg/dL (ref 0.61–1.24)
GFR, Estimated: >60 mL/min (ref >60)
Glucose, Bld: 90 mg/dL (ref 70–99)
Potassium: 3.5 mmol/L (ref 3.5–5.1)
Sodium: 135 mmol/L (ref 135–145)

## 2021-11-20 LAB — CBC
HCT: 44.4 % (ref 39.0–52.0)
Hemoglobin: 15 g/dL (ref 13.0–17.0)
MCH: 30.5 pg (ref 26.0–34.0)
MCHC: 33.8 g/dL (ref 30.0–36.0)
MCV: 90.4 fL (ref 80.0–100.0)
Platelets: 256 10*3/uL (ref 150–400)
RBC: 4.91 MIL/uL (ref 4.22–5.81)
RDW: 13.9 % (ref 11.5–15.5)
WBC: 8.7 10*3/uL (ref 4.0–10.5)
nRBC: 0 % (ref 0.0–0.2)

## 2021-11-21 DIAGNOSIS — G4733 Obstructive sleep apnea (adult) (pediatric): Secondary | ICD-10-CM | POA: Diagnosis not present

## 2021-11-22 ENCOUNTER — Encounter (HOSPITAL_COMMUNITY): Payer: Self-pay | Admitting: Orthopedic Surgery

## 2021-11-22 ENCOUNTER — Encounter: Payer: Self-pay | Admitting: Orthopedic Surgery

## 2021-11-22 ENCOUNTER — Ambulatory Visit (HOSPITAL_COMMUNITY): Payer: Medicaid Other | Admitting: Certified Registered Nurse Anesthetist

## 2021-11-22 ENCOUNTER — Encounter (HOSPITAL_COMMUNITY)
Admission: RE | Disposition: A | Discharge status: Home / Self Care | Payer: Self-pay | Source: Home / Self Care | Attending: Orthopedic Surgery

## 2021-11-22 ENCOUNTER — Other Ambulatory Visit: Payer: Self-pay

## 2021-11-22 ENCOUNTER — Ambulatory Visit (HOSPITAL_COMMUNITY)
Admission: RE | Admit: 2021-11-22 | Discharge: 2021-11-22 | Disposition: A | Discharge status: Home / Self Care | Payer: Medicaid Other | Attending: Orthopedic Surgery | Admitting: Orthopedic Surgery

## 2021-11-22 DIAGNOSIS — M19011 Primary osteoarthritis, right shoulder: Secondary | ICD-10-CM | POA: Insufficient documentation

## 2021-11-22 DIAGNOSIS — Z6841 Body Mass Index (BMI) 40.0 and over, adult: Secondary | ICD-10-CM | POA: Diagnosis not present

## 2021-11-22 DIAGNOSIS — M7551 Bursitis of right shoulder: Secondary | ICD-10-CM | POA: Insufficient documentation

## 2021-11-22 DIAGNOSIS — G8918 Other acute postprocedural pain: Secondary | ICD-10-CM | POA: Diagnosis not present

## 2021-11-22 DIAGNOSIS — E039 Hypothyroidism, unspecified: Secondary | ICD-10-CM | POA: Diagnosis not present

## 2021-11-22 HISTORY — PX: SHOULDER ARTHROSCOPY WITH DISTAL CLAVICLE RESECTION: SHX5675

## 2021-11-22 SURGERY — SHOULDER ARTHROSCOPY WITH DISTAL CLAVICLE RESECTION
Anesthesia: Regional | Site: Shoulder | Laterality: Right

## 2021-11-22 MED ORDER — OXYCODONE HCL 5 MG PO TABS: 5.0000 mg | ORAL_TABLET | ORAL | 0 refills | Status: AC | PRN

## 2021-11-22 MED ORDER — SODIUM CHLORIDE 0.9 % IR SOLN
Status: DC | PRN
  Administered 2021-11-22 (×9): 3000 mL

## 2021-11-22 MED ORDER — SUGAMMADEX SODIUM 200 MG/2ML IV SOLN
INTRAVENOUS | Status: DC | PRN
Start: 2021-11-22 — End: 2021-11-22
  Administered 2021-11-22: 300 mg via INTRAVENOUS

## 2021-11-22 MED ORDER — MIDAZOLAM HCL 5 MG/5ML IJ SOLN
INTRAMUSCULAR | Status: DC | PRN
Start: 2021-11-22 — End: 2021-11-22
  Administered 2021-11-22: 2 mg via INTRAVENOUS

## 2021-11-22 MED ORDER — VASOPRESSIN 20 UNIT/ML IV SOLN
INTRAVENOUS | Status: AC
  Filled 2021-11-22: qty 1

## 2021-11-22 MED ORDER — EPINEPHRINE PF 1 MG/ML IJ SOLN
INTRAMUSCULAR | Status: AC
  Filled 2021-11-22: qty 10

## 2021-11-22 MED ORDER — FENTANYL CITRATE PF 50 MCG/ML IJ SOSY
PREFILLED_SYRINGE | INTRAMUSCULAR | Status: AC
  Filled 2021-11-22: qty 1

## 2021-11-22 MED ORDER — ORAL CARE MOUTH RINSE: 15.0000 mL | Freq: Once | OROMUCOSAL | Status: DC

## 2021-11-22 MED ORDER — LACTATED RINGERS IV SOLN
INTRAVENOUS | Status: DC | PRN
Start: 2021-11-22 — End: 2021-11-22

## 2021-11-22 MED ORDER — FENTANYL CITRATE PF 50 MCG/ML IJ SOSY: 50.0000 ug | PREFILLED_SYRINGE | Freq: Once | INTRAMUSCULAR | Status: AC

## 2021-11-22 MED ORDER — ROCURONIUM BROMIDE 10 MG/ML (PF) SYRINGE
PREFILLED_SYRINGE | INTRAVENOUS | Status: AC
  Filled 2021-11-22: qty 10

## 2021-11-22 MED ORDER — PHENYLEPHRINE HCL-NACL 20-0.9 MG/250ML-% IV SOLN
INTRAVENOUS | Status: AC
  Filled 2021-11-22: qty 250

## 2021-11-22 MED ORDER — BUPIVACAINE-EPINEPHRINE (PF) 0.5% -1:200000 IJ SOLN
INTRAMUSCULAR | Status: AC
  Filled 2021-11-22: qty 30

## 2021-11-22 MED ORDER — HYDROMORPHONE HCL 1 MG/ML IJ SOLN
INTRAMUSCULAR | Status: AC
  Filled 2021-11-22: qty 1

## 2021-11-22 MED ORDER — SUCCINYLCHOLINE CHLORIDE 200 MG/10ML IV SOSY
PREFILLED_SYRINGE | INTRAVENOUS | Status: AC
  Filled 2021-11-22: qty 10

## 2021-11-22 MED ORDER — DEXMEDETOMIDINE (PRECEDEX) IN NS 20 MCG/5ML (4 MCG/ML) IV SYRINGE
PREFILLED_SYRINGE | INTRAVENOUS | Status: AC
  Filled 2021-11-22: qty 5

## 2021-11-22 MED ORDER — ROPIVACAINE HCL 5 MG/ML IJ SOLN
INTRAMUSCULAR | Status: DC | PRN
  Administered 2021-11-22: 30 mL via PERINEURAL

## 2021-11-22 MED ORDER — DEXAMETHASONE SODIUM PHOSPHATE 10 MG/ML IJ SOLN
INTRAMUSCULAR | Status: AC
  Filled 2021-11-22: qty 1

## 2021-11-22 MED ORDER — MIDAZOLAM HCL 2 MG/2ML IJ SOLN
INTRAMUSCULAR | Status: AC
  Filled 2021-11-22: qty 2

## 2021-11-22 MED ORDER — DEXAMETHASONE SODIUM PHOSPHATE 10 MG/ML IJ SOLN
INTRAMUSCULAR | Status: DC | PRN
Start: 2021-11-22 — End: 2021-11-22
  Administered 2021-11-22: 10 mg via INTRAVENOUS

## 2021-11-22 MED ORDER — HYDROMORPHONE HCL 1 MG/ML IJ SOLN: 0.5000 mg | INTRAMUSCULAR | Status: DC | PRN

## 2021-11-22 MED ORDER — ONDANSETRON HCL 4 MG/2ML IJ SOLN: 4.0000 mg | Freq: Once | INTRAMUSCULAR | Status: DC | PRN

## 2021-11-22 MED ORDER — CEFAZOLIN IN SODIUM CHLORIDE 3-0.9 GM/100ML-% IV SOLN
3.0000 g | INTRAVENOUS | Status: AC
  Administered 2021-11-22: 08:00:00 3 g via INTRAVENOUS
  Filled 2021-11-22: qty 100

## 2021-11-22 MED ORDER — FENTANYL CITRATE (PF) 250 MCG/5ML IJ SOLN
INTRAMUSCULAR | Status: DC | PRN
  Administered 2021-11-22 (×2): 50 ug via INTRAVENOUS

## 2021-11-22 MED ORDER — PROPOFOL 10 MG/ML IV BOLUS
INTRAVENOUS | Status: AC
  Filled 2021-11-22: qty 20

## 2021-11-22 MED ORDER — PHENYLEPHRINE HCL-NACL 20-0.9 MG/250ML-% IV SOLN
INTRAVENOUS | Status: DC | PRN
Start: 2021-11-22 — End: 2021-11-22
  Administered 2021-11-22: 50 ug/min via INTRAVENOUS
  Administered 2021-11-22: 40 ug/min via INTRAVENOUS

## 2021-11-22 MED ORDER — PROPOFOL 10 MG/ML IV BOLUS
INTRAVENOUS | Status: DC | PRN
  Administered 2021-11-22: 200 mg via INTRAVENOUS

## 2021-11-22 MED ORDER — OXYCODONE HCL 5 MG PO TABS
ORAL_TABLET | ORAL | Status: AC
  Filled 2021-11-22: qty 1

## 2021-11-22 MED ORDER — ROCURONIUM BROMIDE 10 MG/ML (PF) SYRINGE
PREFILLED_SYRINGE | INTRAVENOUS | Status: DC | PRN
  Administered 2021-11-22: 30 mg via INTRAVENOUS
  Administered 2021-11-22: 100 mg via INTRAVENOUS

## 2021-11-22 MED ORDER — ONDANSETRON HCL 4 MG/2ML IJ SOLN
INTRAMUSCULAR | Status: AC
  Filled 2021-11-22: qty 2

## 2021-11-22 MED ORDER — ONDANSETRON HCL 4 MG/2ML IJ SOLN
INTRAMUSCULAR | Status: DC | PRN
  Administered 2021-11-22: 4 mg via INTRAVENOUS

## 2021-11-22 MED ORDER — FENTANYL CITRATE PF 50 MCG/ML IJ SOSY
25.0000 ug | PREFILLED_SYRINGE | INTRAMUSCULAR | Status: DC | PRN
  Administered 2021-11-22 (×4): 50 ug via INTRAVENOUS

## 2021-11-22 MED ORDER — ONDANSETRON HCL 4 MG PO TABS: 4.0000 mg | ORAL_TABLET | Freq: Three times a day (TID) | ORAL | 0 refills | Status: AC | PRN

## 2021-11-22 MED ORDER — SUGAMMADEX SODIUM 500 MG/5ML IV SOLN
INTRAVENOUS | Status: AC
  Filled 2021-11-22: qty 5

## 2021-11-22 MED ORDER — LACTATED RINGERS IV SOLN: INTRAVENOUS | Status: DC

## 2021-11-22 MED ORDER — ROPIVACAINE HCL 5 MG/ML IJ SOLN
INTRAMUSCULAR | Status: AC
  Filled 2021-11-22: qty 30

## 2021-11-22 MED ORDER — EPINEPHRINE PF 1 MG/ML IJ SOLN
INTRAMUSCULAR | Status: AC
  Filled 2021-11-22: qty 6

## 2021-11-22 MED ORDER — OXYCODONE HCL 5 MG PO TABS
5.0000 mg | ORAL_TABLET | Freq: Once | ORAL | Status: AC
  Administered 2021-11-22: 12:00:00 5 mg via ORAL

## 2021-11-22 MED ORDER — SUCCINYLCHOLINE 20MG/ML (10ML) SYRINGE FOR MEDFUSION PUMP - OPTIME
INTRAMUSCULAR | Status: DC | PRN
  Administered 2021-11-22: 200 mg via INTRAVENOUS

## 2021-11-22 MED ORDER — FENTANYL CITRATE (PF) 100 MCG/2ML IJ SOLN
INTRAMUSCULAR | Status: AC
  Filled 2021-11-22: qty 2

## 2021-11-22 MED ORDER — DEXMEDETOMIDINE (PRECEDEX) IN NS 20 MCG/5ML (4 MCG/ML) IV SYRINGE
PREFILLED_SYRINGE | INTRAVENOUS | Status: DC | PRN
  Administered 2021-11-22: 8 ug via INTRAVENOUS
  Administered 2021-11-22: 12 ug via INTRAVENOUS

## 2021-11-22 MED ORDER — CHLORHEXIDINE GLUCONATE 0.12 % MT SOLN: 15.0000 mL | Freq: Once | OROMUCOSAL | Status: DC

## 2021-11-22 MED ORDER — HYDROMORPHONE HCL 1 MG/ML IJ SOLN
0.5000 mg | INTRAMUSCULAR | Status: AC | PRN
  Administered 2021-11-22 (×4): 0.5 mg via INTRAVENOUS

## 2021-11-22 MED ORDER — CHLORHEXIDINE GLUCONATE 0.12 % MT SOLN
OROMUCOSAL | Status: AC
  Filled 2021-11-22: qty 15

## 2021-11-22 SURGICAL SUPPLY — 56 items
APL PRP STRL LF DISP 70% ISPRP (MISCELLANEOUS) ×1
BLADE HEX COATED 2.75 (ELECTRODE) ×4 IMPLANT
BLADE SURG 15 STRL LF DISP TIS (BLADE) IMPLANT
BLADE SURG 15 STRL SS (BLADE) ×3
BLADE SURG SZ11 CARB STEEL (BLADE) ×2 IMPLANT
BNDG GAUZE ELAST 4 BULKY (GAUZE/BANDAGES/DRESSINGS) ×2 IMPLANT
BURR OVAL 8 FLU 4.0MM X 13CM (MISCELLANEOUS) ×1
BURR OVAL 8 FLU 4.0X13 (MISCELLANEOUS) ×1 IMPLANT
CANNULA TWIST IN 8.25X7CM (CANNULA) ×2 IMPLANT
CHLORAPREP W/TINT 26 (MISCELLANEOUS) ×4 IMPLANT
CLOTH BEACON ORANGE TIMEOUT ST (SAFETY) ×4 IMPLANT
COOLER ICEMAN CLASSIC (MISCELLANEOUS) ×2 IMPLANT
COVER LIGHT HANDLE STERIS (MISCELLANEOUS) ×8 IMPLANT
CUTTER BONE 4.0MM X 13CM (MISCELLANEOUS) ×2 IMPLANT
DRAPE HALF SHEET 40X57 (DRAPES) ×4 IMPLANT
DRAPE SHOULDER BEACH CHAIR (DRAPES) ×4 IMPLANT
DRAPE U-SHAPE 47X51 STRL (DRAPES) ×4 IMPLANT
ELECT REM PT RETURN 9FT ADLT (ELECTROSURGICAL) ×3
ELECTRODE REM PT RTRN 9FT ADLT (ELECTROSURGICAL) ×2 IMPLANT
GAUZE 4X4 16PLY ~~LOC~~+RFID DBL (SPONGE) ×4 IMPLANT
GAUZE XEROFORM 1X8 LF (GAUZE/BANDAGES/DRESSINGS) ×2 IMPLANT
GLOVE SRG 8 PF TXTR STRL LF DI (GLOVE) ×2 IMPLANT
GLOVE SURG POLYISO LF SZ8 (GLOVE) ×12 IMPLANT
GLOVE SURG UNDER POLY LF SZ7 (GLOVE) ×4 IMPLANT
GLOVE SURG UNDER POLY LF SZ8 (GLOVE) ×3
GOWN STRL REUS W/ TWL XL LVL3 (GOWN DISPOSABLE) ×2 IMPLANT
GOWN STRL REUS W/TWL LRG LVL3 (GOWN DISPOSABLE) ×8 IMPLANT
GOWN STRL REUS W/TWL XL LVL3 (GOWN DISPOSABLE) ×3
INST SET MINOR BONE (KITS) ×4 IMPLANT
IV NS IRRIG 3000ML ARTHROMATIC (IV SOLUTION) ×24 IMPLANT
KIT BLADEGUARD II DBL (SET/KITS/TRAYS/PACK) ×4 IMPLANT
KIT POSITION SHOULDER SCHLEI (MISCELLANEOUS) ×4 IMPLANT
KIT TURNOVER KIT A (KITS) ×4 IMPLANT
MANIFOLD NEPTUNE II (INSTRUMENTS) ×4 IMPLANT
MARKER SKIN DUAL TIP RULER LAB (MISCELLANEOUS) ×4 IMPLANT
NDL SPNL 18GX3.5 QUINCKE PK (NEEDLE) ×2 IMPLANT
NEEDLE SPNL 18GX3.5 QUINCKE PK (NEEDLE) ×3 IMPLANT
PACK TOTAL JOINT (CUSTOM PROCEDURE TRAY) ×4 IMPLANT
PAD ABD 5X9 TENDERSORB (GAUZE/BANDAGES/DRESSINGS) ×4 IMPLANT
PAD ARMBOARD 7.5X6 YLW CONV (MISCELLANEOUS) ×4 IMPLANT
PAD COLD SHLDR WRAP-ON (PAD) ×2 IMPLANT
PORT APPOLLO RF 90DEGREE MULTI (SURGICAL WAND) ×2 IMPLANT
SET ARTHROSCOPY INST (INSTRUMENTS) ×4 IMPLANT
SET BASIN LINEN APH (SET/KITS/TRAYS/PACK) ×4 IMPLANT
SET SHOULDER TRAC (MISCELLANEOUS) ×2 IMPLANT
SET SHOULDER TRACTION (MISCELLANEOUS) ×3
SLING ARM IMMOBILIZER XL (CAST SUPPLIES) IMPLANT
SPONGE GAUZE 4X4 12PLY (GAUZE/BANDAGES/DRESSINGS) ×2 IMPLANT
SPONGE T-LAP 18X18 ~~LOC~~+RFID (SPONGE) ×4 IMPLANT
SUT ETHILON 3 0 FSL (SUTURE) ×2 IMPLANT
SYR 10ML LL (SYRINGE) ×2 IMPLANT
SYR BULB IRRIG 60ML STRL (SYRINGE) ×8 IMPLANT
TOWEL OR 17X26 4PK STRL BLUE (TOWEL DISPOSABLE) ×4 IMPLANT
TUBING IN/OUT FLOW W/MAIN PUMP (TUBING) ×6 IMPLANT
WAND 90 DEG TURBOVAC W/CORD (SURGICAL WAND) ×4 IMPLANT
YANKAUER SUCT BULB TIP 10FT TU (MISCELLANEOUS) ×10 IMPLANT

## 2021-11-22 NOTE — Interval H&P Note (Signed)
History and Physical Interval Note:  11/22/2021 7:18 AM  [name]  has presented today for surgery, with the diagnosis of Right AC joint arthritis.  The various methods of treatment have been discussed with the patient and family. After consideration of risks, benefits and other options for treatment, the patient has consented to  Procedure(s) with comments: SHOULDER ARTHROSCOPY WITH DISTAL CLAVICLE RESECTION (Right) - Right shoulder arthroscopy, distal clavicle resection; possible biceps tenodesis as a surgical intervention.  The patient's history has been reviewed, patient examined, no change in status, stable for surgery.  I have reviewed the patient's chart and labs.  Questions were answered to the patient's satisfaction.     Mordecai Rasmussen

## 2021-11-22 NOTE — Anesthesia Procedure Notes (Signed)
Anesthesia Regional Block: Interscalene brachial plexus block   Pre-Anesthetic Checklist: , timeout performed,  Correct Patient, Correct Site, Correct Laterality,  Correct Procedure, Correct Position, site marked,  Risks and benefits discussed,  Surgical consent,  Pre-op evaluation,  At surgeon's request and post-op pain management  Laterality: Right  Prep: chloraprep       Needles:  Injection technique: Single-shot  Needle Type: Echogenic Stimulator Needle     Needle Length: 4cm  Needle Gauge: 20   Needle insertion depth: 3.5 cm   Additional Needles:   Procedures:, nerve stimulator,,, ultrasound used (permanent image in chart),,   Motor weakness within 5 minutes.   Nerve Stimulator or Paresthesia:  Response: motor response, 20 mA, 3.5 cm  Additional Responses:   Narrative:  Start time: 11/22/2021 7:25 AM End time: 11/22/2021 7:35 AM Injection made incrementally with aspirations every 30 mL.  Performed by: Personally  CRNA: Maude Leriche, CRNA  Additional Notes: Sterile technique used for procedure, patient tolerated, VS per flowsheet.

## 2021-11-22 NOTE — Discharge Instructions (Signed)
[name] A. Phillip Kinsman, MD [name] [address] [place],  [place]  [address] Phone: [phone] Fax: [phone]    POST-OPERATIVE INSTRUCTIONS - SHOULDER ARTHROSCOPY  WOUND CARE You may remove the Operative Dressing on Post-Op Day #3 (72hrs after surgery).   Alternatively if you would like you can leave dressing on until follow-up if within 7-8 days but keep it dry. Leave steri-strips in place until they fall off on their own, usually 2 weeks postop. There may be a small amount of fluid/bleeding leaking at the surgical site. This is normal; the shoulder is filled with fluid during the procedure and can leak for 24-48hrs after surgery. ou may change/reinforce the bandage as needed.  Use the Cryocuff or Ice as often as possible for the first 7 days, then as needed for pain relief. Always keep a towel, ACE wrap or other barrier between the cooling unit and your skin.  You may shower on Post-Op Day #3. Gently pat the area dry. Do not soak the shoulder in water or submerge it. Keep dry incisions as dry as possible. Do not go swimming in the pool or ocean until 4 weeks after surgery or when otherwise instructed.    EXERCISES/BRACING Sling should be used at all times until follow-up.  You can remove sling for hygiene.    Please continue to ambulate and do not stay sitting or lying for too long. Perform foot and wrist pumps to assist in circulation.  POST-OP MEDICATIONS- Multimodal approach to pain control In general your pain will be controlled with a combination of substances.  Prescriptions unless otherwise discussed are electronically sent to your pharmacy.  This is a carefully made plan we use to minimize narcotic use.    Oxycodone - This is a strong narcotic, to be used only on an as needed basis for pain. Zofran - take as needed for nausea   FOLLOW-UP If you develop a Fever (?101.5), Redness or Drainage from the surgical incision site, please call our  office to arrange for an evaluation. Please call the office to schedule a follow-up appointment for your suture removal, 10-14 days post-operatively.    HELPFUL INFORMATION  If you had a block, it will wear off between 8-24 hrs postop typically.  This is period when your pain may go from nearly zero to the pain you would have had postop without the block.  This is an abrupt transition but nothing dangerous is happening.  You may take an extra dose of narcotic when this happens.  You may be more comfortable sleeping in a semi-seated position the first few nights following surgery.  Keep a pillow propped under the elbow and forearm for comfort.  If you have a recliner type of chair it might be beneficial.  If not that is fine too, but it would be helpful to sleep propped up with pillows behind your operated shoulder as well under your elbow and forearm.  This will reduce pulling on the suture lines.  When dressing, put your operative arm in the sleeve first.  When getting undressed, take your operative arm out last.  Loose fitting, button-down shirts are recommended.  Often in the first days after surgery you may be more comfortable keeping your operative arm under your shirt and not through the sleeve.  You may return to work/school in the next couple of days when you feel up to it.  Desk work and typing in the sling is  fine.  We suggest you use the pain medication the first night prior to going to bed, in order to ease any pain when the anesthesia wears off. You should avoid taking pain medications on an empty stomach as it will make you nauseous.  You should wean off your narcotic medicines as soon as you are able.  Most patients will be off or using minimal narcotics before their first postop appointment.   Do not drink alcoholic beverages or take illicit drugs when taking pain medications.  It is against the law to drive while taking narcotics.  In some states it is against the law to  drive while your arm is in a sling.   Pain medication may make you constipated.  Below are a few solutions to try in this order: Decrease the amount of pain medication if you aren't having pain. Drink lots of decaffeinated fluids. Drink prune juice and/or eat dried prunes  If the first 3 don't work start with additional solutions Take Colace - an over-the-counter stool softener Take Senokot - an over-the-counter laxative Take Miralax - a stronger over-the-counter laxative

## 2021-11-22 NOTE — H&P (Signed)
Below is the most recent clinic note for [name]; any pertinent information regarding their recent medical history will be updated on the day of surgery.    Orthopaedic Clinic Return   Assessment: [name] is a 35 y.o. male with the following: Right AC joint arthritis     Plan: We reviewed the MRI in clinic today, which demonstrates degenerative changes at the [name] joint, or as well as reactive bone edema within the acromion, as well as the distal clavicle.  He has had multiple previous injections, directly into the [facility] joint, with excellent relief, although it was not sustained.  He has pain directly over the [facility] joint.  He complains of pain with certain motions, only at the [facility] joint.  As a result, we discussed the possibility of proceeding with surgery.  He is interested in proceeding.  Based on the MRI, as well as his function, I do not think we will need to do anything to his rotator cuff.  I do think there is a small chance that we have to evaluate the biceps tendon, and possibly complete a biceps tenodesis.  Risks and benefits of the procedure were discussed with the patient including, but not limited to, infection, bleeding, damage to surrounding structure, recurrent pain, persistent deformity at the [facility] joint, and severe complications associated with anesthesia.  He is elected proceed.   We will confirm that he is cleared for surgery with Dr. Lajuana Ripple.   He would like to proceed with surgery November 22, 2021.  This will be an outpatient procedure.  Small chance that we will be open, but most likely will all be arthroscopic.   The patient meets the AMA guidelines for Morbid obesity with BMI > 40.  The patient has been counseled on weight loss.      Follow-up: Return for After surgery; 2 weeks postop DOS 11/22/21.     Subjective:       Chief Complaint  Patient presents with   Shoulder Pain      Rt shoulder pain      History of Present Illness: [name] is a 35 y.o. male who returns to clinic for repeat evaluation of his right shoulder.  He has had pain in the right shoulder for the past 18 months or so.  He has had multiple injections in the [facility] joint, all provided excellent relief, with differing lengths.  The most recent injection lasted for less than a week.  He states that the pain in his shoulder was almost completely resolved while the injection was effective.  He has tried physical therapy, with limited improvement.  He has tried medications as well.  He presents today to review the findings of his recent MRI.   Review of Systems: No fevers or chills No numbness or tingling No chest pain No shortness of breath No bowel or bladder dysfunction No GI distress No headaches     Objective: Ht 5\' 10"  (1.778 m)    Wt (!) 317 lb (143.8 kg)    BMI 45.48 kg/m    Physical Exam:   Obese male.  No acute distress.  Alert and oriented.   Near full range of motion, with pain at the [facility] joint.  Tenderness palpation directly over the [facility] joint.  Pain with cross body abduction.  No tenderness to palpation over the biceps tendon.  Negative O'Brien's.  IMAGING: I personally ordered and reviewed the following images:  Right shoulder MRI  IMPRESSION: 1. Mild arthropathy of the [facility] joint. Prominent bone marrow edema within the acromion process and to a lesser degree within the distal clavicle. No well-defined fracture line. Findings may be reactive, reflect a bone contusion, or be secondary to stress reaction. 2. Moderate rotator cuff tendinosis without evidence of tear. 3. Mild intra-articular biceps tendinosis.

## 2021-11-22 NOTE — Op Note (Signed)
Orthopaedic Surgery Operative Note (CSN: [id number])  [name]  [name] 17, 1986 Date of Surgery: 11/22/2021   Diagnoses:  Right Acromioclavicular joint arthritis  Procedure: Arthroscopic subacromial decompression Arthroscopic distal clavicle excision   Successful completion of the planned procedure.  Extensive subacromial bursitis.  Rotator cuff intact.  Distal clavicle excised arthroscopically.    Post-Op Diagnosis: Same Surgeons:Primary: Mordecai Rasmussen, MD Assistants: Marquita Palms Location: [facility] OR ROOM 4 Anesthesia: General with Exparel interscalene block Antibiotics: Ancef 3 g Tourniquet time: None Estimated Blood Loss: Minimal Complications: None Specimens: None Implants: * No implants in log *  Indications for Surgery:   [name] is a 35 y.o. male with continued shoulder pain refractory to nonoperative measures for extended period of time.  He had AC joint arthritis as documented on XR and MRI and previously had an AC joint injection that completely alleviated his pain for 2 weeks.  Pain was located at the [facility] joint with strength and ROM testing. The risks and benefits were explained at length including but not limited to continued pain, need for additional surgery, stiffness, need for further surgery, infection and more severe complications associated with anesthesia.  He elected to proceed.  Surgical consent was finalized.    Procedure:   Patient was correctly identified in the preoperative holding area and operative site marked.  Patient was brought to OR and positioned beachchair ensuring that all bony prominences were padded and the head was in an appropriate position.  Anesthesia was induced and the operative shoulder was prepped and draped in the usual sterile fashion.  Timeout was called preincision.  A standard posterior viewing portal was made after localizing the portal with a spinal needle.  All of his pain was at the [facility] joint so we introduced the  camera within the subacromial space to begin.   Next, we made a lateral portal with spinal needle guidance. We noted extensive subacromial bursal tissue, making visibility difficult.  We then proceeded to debride bursal tissue extensively with a shaver and arthrocare device. At that point we continued to identify the borders of the acromion and identify the spur. We then carefully preserved the deltoid fascia and used a burr to convert the acromion to a Type 1 flat acromion without issue.  The rotator cuff was evaluated closely and noted to be intact with areas of injury.    Under direct visualization, we placed an anterior portal inline with the AC joint.  A hemostat was placed through the anterior portal and we spread at the [facility] joint.  A burr was then inserted and 8 mm of distal clavicle was resected taking care to avoid damage to the capsule around the joint and avoiding overhanging bone posteriorly.    The incisions were closed with 3-0 nylon.  A sterile dressing was placed along with a sling. The patient was awoken from general anesthesia and taken to the PACU in stable condition without complication.    Post-operative plan:  The patient will be non-weightbearing in a sling.   The patient will be discharged home from the PACU. DVT prophylaxis not indicated in ambulatory upper extremity patient without known risk factors. Pain control with PRN pain medication preferring oral medicines.   Follow up plan will be scheduled in approximately 10-14 days for incision check and XR.

## 2021-11-22 NOTE — Anesthesia Preprocedure Evaluation (Signed)
Anesthesia Evaluation  Patient identified by MRN, date of birth, ID band Patient awake    Reviewed: Allergy & Precautions, H&P , NPO status , Patient's Chart, lab work & pertinent test results, reviewed documented beta blocker date and time   Airway Mallampati: II  TM Distance: >3 FB Neck ROM: full    Dental no notable dental hx.    Pulmonary sleep apnea ,    Pulmonary exam normal breath sounds clear to auscultation       Cardiovascular Exercise Tolerance: Good negative cardio ROS   Rhythm:regular Rate:Normal     Neuro/Psych PSYCHIATRIC DISORDERS Anxiety Depression Bipolar Disorder Schizophrenia negative neurological ROS     GI/Hepatic Neg liver ROS, hiatal hernia, PUD, GERD  Medicated,  Endo/Other  Hypothyroidism Morbid obesity  Renal/GU negative Renal ROS  negative genitourinary   Musculoskeletal   Abdominal   Peds  Hematology  (+) Blood dyscrasia, anemia ,   Anesthesia Other Findings   Reproductive/Obstetrics negative OB ROS                             Anesthesia Physical Anesthesia Plan  ASA: 3  Anesthesia Plan: General and General ETT   Post-op Pain Management: Regional block   Induction:   PONV Risk Score and Plan:   Airway Management Planned:   Additional Equipment:   Intra-op Plan:   Post-operative Plan:   Informed Consent: I have reviewed the patients History and Physical, chart, labs and discussed the procedure including the risks, benefits and alternatives for the proposed anesthesia with the patient or authorized representative who has indicated his/her understanding and acceptance.     Dental Advisory Given  Plan Discussed with: CRNA  Anesthesia Plan Comments:         Anesthesia Quick Evaluation

## 2021-11-22 NOTE — Transfer of Care (Signed)
Immediate Anesthesia Transfer of Care Note  Patient: [name]  Procedure(s) Performed: SHOULDER ARTHROSCOPY WITH SUBACROMIAL DECOMPRESSION AND DISTAL CLAVICLE EXCISION (Right: Shoulder)  Patient Location: PACU  Anesthesia Type:GA combined with regional for post-op pain  Level of Consciousness: awake, alert  and oriented  Airway & Oxygen Therapy: Patient Spontanous Breathing and Patient connected to nasal cannula oxygen  Post-op Assessment: Report given to RN, Post -op Vital signs reviewed and stable, Patient moving all extremities and Patient able to stick tongue midline  Post vital signs: stable  Last Vitals:  Vitals Value Taken Time  BP 156/43   Temp 98.0   Pulse 97 11/22/21 1056  Resp 15 11/22/21 1059  SpO2 100 % 11/22/21 1056  Vitals shown include unvalidated device data.  Last Pain:  Vitals:   11/22/21 0657  TempSrc: Oral  PainSc:          Complications: No notable events documented.

## 2021-11-22 NOTE — Anesthesia Procedure Notes (Addendum)
Procedure Name: Intubation Date/Time: 11/22/2021 7:53 AM Performed by: Maude Leriche, CRNA Pre-anesthesia Checklist: Patient identified, Emergency Drugs available, Suction available and Patient being monitored Patient Re-evaluated:Patient Re-evaluated prior to induction Oxygen Delivery Method: Circle system utilized Preoxygenation: Pre-oxygenation with 100% oxygen Induction Type: IV induction Ventilation: Mask ventilation without difficulty Laryngoscope Size: Glidescope and 4 (S4) Grade View: Grade I Tube type: Oral Tube size: 7.5 mm Number of attempts: 1 Airway Equipment and Method: Video-laryngoscopy, Rigid stylet and Bite block Placement Confirmation: ETT inserted through vocal cords under direct vision, positive ETCO2 and breath sounds checked- equal and bilateral Secured at: 23 cm Tube secured with: Tape Dental Injury: Teeth and Oropharynx as per pre-operative assessment

## 2021-11-22 NOTE — Anesthesia Postprocedure Evaluation (Signed)
Anesthesia Post Note  Patient: [name]  Procedure(s) Performed: SHOULDER ARTHROSCOPY WITH SUBACROMIAL DECOMPRESSION AND DISTAL CLAVICLE EXCISION (Right: Shoulder)  Patient location during evaluation: Phase II Anesthesia Type: Regional Level of consciousness: awake Pain management: pain level controlled Vital Signs Assessment: post-procedure vital signs reviewed and stable Respiratory status: spontaneous breathing and respiratory function stable Cardiovascular status: blood pressure returned to baseline and stable Postop Assessment: no headache and no apparent nausea or vomiting Anesthetic complications: no Comments: Late entry   No notable events documented.   Last Vitals:  Vitals:   11/22/21 1230 11/22/21 1236  BP: 136/87 136/82  Pulse: 93 88  Resp: 19 18  Temp: (!) 36.2 C 36.6 C  SpO2: 96% 96%    Last Pain:  Vitals:   11/22/21 1236  TempSrc: Oral  PainSc: [place]

## 2021-11-23 ENCOUNTER — Encounter (HOSPITAL_COMMUNITY): Payer: Self-pay | Admitting: Orthopedic Surgery

## 2021-12-03 ENCOUNTER — Ambulatory Visit (INDEPENDENT_AMBULATORY_CARE_PROVIDER_SITE_OTHER): Payer: Medicaid Other | Admitting: Family Medicine

## 2021-12-03 ENCOUNTER — Encounter: Payer: Self-pay | Admitting: Family Medicine

## 2021-12-03 VITALS — BP 121/75 | HR 99 | Temp 97.8°F | Ht 69.0 in | Wt 313.2 lb

## 2021-12-03 DIAGNOSIS — Z9889 Other specified postprocedural states: Secondary | ICD-10-CM | POA: Diagnosis not present

## 2021-12-03 DIAGNOSIS — Z9884 Bariatric surgery status: Secondary | ICD-10-CM | POA: Diagnosis not present

## 2021-12-03 DIAGNOSIS — F902 Attention-deficit hyperactivity disorder, combined type: Secondary | ICD-10-CM

## 2021-12-03 MED ORDER — AMPHETAMINE-DEXTROAMPHETAMINE 20 MG PO TABS: 20.0000 mg | ORAL_TABLET | Freq: Every day | ORAL | 0 refills | Status: DC

## 2021-12-03 NOTE — Progress Notes (Signed)
Subjective: CC: ADHD PCP: [name], DO HQP:RFFMBWGY [name] is a 36 y.o. male who is coming today by his wife.  He is presenting to clinic today for:  1.  ADHD Patient takes the Adderall 20 mg immediate release daily for ADHD control.  Overall this does control ADHD well but he had lapse in medication due to a recent backorder of the medicine.  He just darted a couple of days ago.  He does report constipation with the medication but a stool softener daily helps with this.  Denies any increased anxiety, insomnia or other issues.  2.  Status post shoulder surgery Patient had right shoulder surgery about 2 weeks ago.  He seems to be recovering well from this.  Has not started any physical therapy but does have an appointment tomorrow for possible stitch removal with his surgeon.  Has some slight redness at the stitch site but no drainage, increased warmth or other concerning symptoms.   ROS: Per HPI  Allergies  Allergen Reactions   Ibuprofen Other (See Comments)    Makes ulcers bleed   Influenza Vaccines Shortness Of Breath and Other (See Comments)    Rash and unable to breathe well   Tylenol [Acetaminophen] Hives   Bee Venom Swelling    SWELLING REACTION UNSPECIFIED    Tramadol Itching   Past Medical History:  Diagnosis Date   Abscess    Right forearm healing   ADHD (attention deficit hyperactivity disorder)    Anal fissure    Anxiety    Arthritis    back, shoulders, ankles, knees   Back pain    Bipolar disorder (HCC)    Chest pain    Chronic lower back pain    Constipation    Depression    Esophagitis    Distal esophageal erosions consistent with mild erosive  reflux esophagitis 12/2008 by EGD    External hemorrhoids    Gastric polyps    Gastric ulcer    GERD (gastroesophageal reflux disease)    Hepatic steatosis    Hiatal hernia    History of stomach ulcers    Hx MRSA infection 06/2007   right thigh   Hx of colonic polyps    Hypercholesteremia     denies   Hypothyroidism    Internal hemorrhoids    Irritable bowel syndrome (IBS)    Obesity    Occult GI bleeding 12/2008   Trivial upper GI bleed/uncontrolled GERD by upper endoscopy 12/2008, normal f/u endoscopy 03/2009 with SBCE at that time   OSA on CPAP    Pain management    Pneumonia 09/01/2015   "on ATB still" (09/04/2015)   S/P colonoscopy 11/10, 10/08   Dr [name]   Schizophrenia [facility])    Sleep apnea    Stomach ulcer    Thyroid function test abnormal    Noted in 2011 discharge   Tobacco dipper    Wears contact lenses     Current Outpatient Medications:    amphetamine-dextroamphetamine (ADDERALL) 20 MG tablet, Take 1 tablet (20 mg total) by mouth daily. To replace XR given recent bypass surgery (Patient not taking: Reported on 11/12/2021), Disp: 30 tablet, Rfl: 0   amphetamine-dextroamphetamine (ADDERALL) 20 MG tablet, Take 1 tablet (20 mg total) by mouth daily., Disp: 30 tablet, Rfl: 0   amphetamine-dextroamphetamine (ADDERALL) 20 MG tablet, Take 1 tablet (20 mg total) by mouth daily. (Patient not taking: Reported on 11/12/2021), Disp: 30 tablet, Rfl: 0   Calcium Carbonate (CALCIUM 600 PO),  Take 600 mg by mouth in the morning, at noon, and at bedtime., Disp: , Rfl:    cetirizine (ZYRTEC) 10 MG tablet, Take 10 mg by mouth daily., Disp: , Rfl:    cyclobenzaprine (FLEXERIL) 10 MG tablet, Take 1 tablet by mouth three times daily as needed for muscle spasm (Patient taking differently: Take 10 mg by mouth 3 (three) times daily.), Disp: 90 tablet, Rfl: 0   docusate sodium (COLACE) 250 MG capsule, Take 250 mg by mouth daily., Disp: , Rfl:    EPINEPHrine 0.3 mg/0.3 mL IJ SOAJ injection, Inject 0.3 mg into the muscle as needed for anaphylaxis., Disp: , Rfl:    EUTHYROX 75 MCG tablet, TAKE 1 TABLET BY MOUTH ONCE DAILY BEFORE BREAKFAST, Disp: 90 tablet, Rfl: 2   gabapentin (NEURONTIN) 300 MG capsule, TAKE 1 CAPSULE BY MOUTH THREE TIMES DAILY, Disp: 90 capsule, Rfl: 0   lamoTRIgine  (LAMICTAL) 25 MG tablet, Take 2 tablets by mouth twice daily, Disp: 120 tablet, Rfl: 5   Multiple Vitamins-Minerals (BARIATRIC MULTIVITAMINS/IRON PO), Take 1 tablet by mouth in the morning and at bedtime., Disp: , Rfl:    ondansetron (ZOFRAN) 4 MG tablet, Take 1 tablet (4 mg total) by mouth every 8 (eight) hours as needed for up to 14 days for nausea or vomiting., Disp: 15 tablet, Rfl: 0   ondansetron (ZOFRAN-ODT) 4 MG disintegrating tablet, Dissolve 1 tablet (4 mg total) by mouth every 6 (six) hours as needed for nausea or vomiting., Disp: 20 tablet, Rfl: 0   pantoprazole (PROTONIX) 40 MG tablet, Take 1 tablet (40 mg total) by mouth 2 (two) times daily. **Take twice a day for 1 month after surgery, then decrease to once daily., Disp: 180 tablet, Rfl: 0   Vitamin D, Ergocalciferol, (DRISDOL) 1.25 MG (50000 UNIT) CAPS capsule, TAKE 1 CAPSULE BY MOUTH ONCE A WEEK EVERY  THURSDAY (Patient taking differently: Take 50,000 Units by mouth every Friday.), Disp: 12 capsule, Rfl: 4 Social History   Socioeconomic History   Marital status: Married    Spouse name: Tabitha   Number of children: 2   Years of education: Not on file   Highest education level: Not on file  Occupational History   Occupation: Firefighter    Employer: [employer]   Occupation: truck Geophysicist/field seismologist  Tobacco Use   Smoking status: Never   Smokeless tobacco: Former    Types: Snuff    Quit date: 12/16/2020   Tobacco comments:    quitting snuff. whinning off   Vaping Use   Vaping Use: Never used  Substance and Sexual Activity   Alcohol use: Not Currently   Drug use: No   Sexual activity: Not Currently  Other Topics Concern   Not on file  Social History Narrative   Not on file   Social Determinants of Health   Financial Resource Strain: Not on file  Food Insecurity: Not on file  Transportation Needs: Not on file  Physical Activity: Not on file  Stress: Not on file  Social Connections: Not on file  Intimate Partner  Violence: Not on file   Family History  Problem Relation Age of Onset   Leukemia Father 26   Colon polyps Father    Clotting disorder Father    Heart disease Father    Cancer Father    Depression Father    Sleep apnea Father    Obesity Father    Seizures Mother    Irritable bowel syndrome Mother    Thyroid disease  Mother    Depression Mother    Anxiety disorder Mother    Bipolar disorder Brother    ADD / ADHD Brother    Diabetes Paternal Grandmother    Ulcerative colitis Paternal Aunt    Colon cancer Neg Hx    Liver cancer Neg Hx    Esophageal cancer Neg Hx    Stomach cancer Neg Hx    Pancreatic cancer Neg Hx     Objective: Office vital signs reviewed. BP 121/75    Pulse 99    Temp 97.8 F (36.6 C)    Ht 5\' 9"  (1.753 m)    Wt (!) 313 lb 3.2 oz (142.1 kg)    SpO2 100%    BMI 46.25 kg/m   Physical Examination:  General: Awake, alert, well nourished, No acute distress HEENT: Sclera white.  Moist mucous membranes Cardio: regular rate and rhythm, S1S2 heard, no murmurs appreciated Pulm: clear to auscultation bilaterally, no wheezes, rhonchi or rales; normal work of breathing on room air MSK: Right shoulder in sling Skin: Sutures are dry.  Minimal erythema noted just below the suture insertion sites but no significant redness, induration or any appreciable drainage  Assessment/ Plan: 36 y.o. male   Attention deficit hyperactivity disorder (ADHD), combined type - Plan: amphetamine-dextroamphetamine (ADDERALL) 20 MG tablet, amphetamine-dextroamphetamine (ADDERALL) 20 MG tablet, amphetamine-dextroamphetamine (ADDERALL) 20 MG tablet  S/P shoulder surgery  Obesity, morbid, BMI 40.0-49.9 (HCC)  S/P gastric bypass  ADHD is stable now that he is back on his medicines.  National narcotic database was reviewed and there were no red flags.  He had an appropriate opioid status post surgery.  See below.  He is up-to-date on UDS and CSC.  3 months of postdated prescription sent.  He  may follow-up in 3 months, sooner if concerns arise  Shoulder looks to be without concerning symptoms or signs of infection.  He does have slight erythema but this is directly underneath the suture so I think this is unlikely to represent infection.  Continues to lose weight gradually status post gastric bypass surgery.  His vitamin levels were normal last check so these were not repeated today.  We will follow-up again in 3 months and we can obtain labs including thyroid at that visit  No orders of the defined types were placed in this encounter.  No orders of the defined types were placed in this encounter.    [name], DO [place] [phone]

## 2021-12-04 ENCOUNTER — Other Ambulatory Visit: Payer: Self-pay

## 2021-12-04 ENCOUNTER — Ambulatory Visit (INDEPENDENT_AMBULATORY_CARE_PROVIDER_SITE_OTHER): Payer: Medicaid Other | Admitting: Orthopedic Surgery

## 2021-12-04 ENCOUNTER — Encounter: Payer: Self-pay | Admitting: Orthopedic Surgery

## 2021-12-04 ENCOUNTER — Ambulatory Visit: Payer: Medicaid Other

## 2021-12-04 VITALS — Ht 69.0 in | Wt 313.0 lb

## 2021-12-04 DIAGNOSIS — Z09 Encounter for follow-up examination after completed treatment for conditions other than malignant neoplasm: Secondary | ICD-10-CM

## 2021-12-04 DIAGNOSIS — M25511 Pain in right shoulder: Secondary | ICD-10-CM | POA: Diagnosis not present

## 2021-12-04 MED ORDER — OXYCODONE HCL 5 MG PO TABS
5.0000 mg | ORAL_TABLET | Freq: Four times a day (QID) | ORAL | 0 refills | Status: AC | PRN
Start: 2021-12-04 — End: 2021-12-11

## 2021-12-04 NOTE — Progress Notes (Signed)
Orthopaedic Postop Note  Assessment: [name] is a 36 y.o. male s/p right shoulder arthroscopy, subacromial decompression and distal clavicle excision  DOS: 11/22/2021  Plan: Sutures were removed, Steri-Strips placed Radiographs were reviewed with the patient demonstrating distal clavicle excision, without acute injuries. Pain is improving Tolerates gentle range of motion Provided PT protocol, and will refer him to initiate therapy immediately. Okay to come out of the sling.  Limit range of motion per the protocol Provided an additional prescription for medications, urging him to continue to wean off this medication.  Meds ordered this encounter  Medications   oxyCODONE (ROXICODONE) 5 MG immediate release tablet    Sig: Take 1 tablet (5 mg total) by mouth every 6 (six) hours as needed for up to 7 days.    Dispense:  20 tablet    Refill:  0     Follow-up: Return in about 4 weeks (around 01/01/2022). XR at next visit: None  Subjective:  Chief Complaint  Patient presents with   Routine Post Op    Rt shoulder DOS 11/22/21    History of Present Illness: [name] is a 36 y.o. male who presents following the above stated procedure.  Surgery was approximately 2 weeks ago.  His pain is improving.  He does continue to take pain medications.  He states the ice pack was very helpful.  He has remained in the sling.  No numbness or tingling.  He has been moving his elbow and his wrist.  The sutures have been irritating his skin.  Review of Systems: No fevers or chills No numbness or tingling No Chest Pain No shortness of breath   Objective: Ht 5\' 9"  (1.753 m)    Wt (!) 313 lb (142 kg)    BMI 46.22 kg/m   Physical Exam:  Alert and oriented.  No acute distress.  Small amount of swelling and bruising over the anterior shoulder and upper arm.  Incisions are healing well.  No drainage.  Tolerates forward flexion to 90 degrees passively.  Abduction to 80 degrees.   2+ radial pulse.  IMAGING: I personally ordered and reviewed the following images:  X-ray of the right shoulder was obtained in clinic today.  No acute injuries are noted.  Glenohumeral joint is reduced.  Sequelae of distal clavicle excision is visible.  An appropriate gap demonstrated between the distal extent of the clavicle, and the acromion.  Impression: Right shoulder x-ray with sequelae of distal clavicle excision.  No acute injuries   Mordecai Rasmussen, MD 12/04/2021 9:20 AM

## 2021-12-11 ENCOUNTER — Encounter: Payer: Medicaid Other | Admitting: Orthopedic Surgery

## 2021-12-12 ENCOUNTER — Other Ambulatory Visit: Payer: Self-pay | Admitting: Orthopedic Surgery

## 2021-12-12 DIAGNOSIS — M25511 Pain in right shoulder: Secondary | ICD-10-CM

## 2021-12-12 DIAGNOSIS — Z09 Encounter for follow-up examination after completed treatment for conditions other than malignant neoplasm: Secondary | ICD-10-CM

## 2021-12-13 ENCOUNTER — Other Ambulatory Visit: Payer: Self-pay

## 2021-12-13 ENCOUNTER — Ambulatory Visit: Payer: Medicaid Other | Attending: Orthopedic Surgery

## 2021-12-13 DIAGNOSIS — M6281 Muscle weakness (generalized): Secondary | ICD-10-CM | POA: Diagnosis not present

## 2021-12-13 DIAGNOSIS — M25511 Pain in right shoulder: Secondary | ICD-10-CM | POA: Diagnosis not present

## 2021-12-13 DIAGNOSIS — Z09 Encounter for follow-up examination after completed treatment for conditions other than malignant neoplasm: Secondary | ICD-10-CM | POA: Diagnosis not present

## 2021-12-13 NOTE — Therapy (Signed)
[facility] [place], [place], [address] Phone: [phone]   Fax:  [phone]  Physical Therapy Evaluation  Patient Details  Name: [name] MRN: [id number] Date of Birth: [birth year] Referring Provider (PT): [name], MD   Encounter Date: 12/13/2021   PT End of Session - 12/13/21 1252     Visit Number 1    Number of Visits 14    Date for PT Re-Evaluation 02/22/22    PT Start Time 8413    PT Stop Time 2440    PT Time Calculation (min) 36 min    Activity Tolerance Patient tolerated treatment well    Behavior During Therapy [facility] for tasks assessed/performed             Past Medical History:  Diagnosis Date   Abscess    Right forearm healing   ADHD (attention deficit hyperactivity disorder)    Anal fissure    Anxiety    Arthritis    back, shoulders, ankles, knees   Back pain    Bipolar disorder (HCC)    Chest pain    Chronic lower back pain    Constipation    Depression    Esophagitis    Distal esophageal erosions consistent with mild erosive  reflux esophagitis 12/2008 by EGD    External hemorrhoids    Gastric polyps    Gastric ulcer    GERD (gastroesophageal reflux disease)    Hepatic steatosis    Hiatal hernia    History of stomach ulcers    Hx MRSA infection 06/2007   right thigh   Hx of colonic polyps    Hypercholesteremia    denies   Hypothyroidism    Internal hemorrhoids    Irritable bowel syndrome (IBS)    Obesity    Occult GI bleeding 12/2008   Trivial upper GI bleed/uncontrolled GERD by upper endoscopy 12/2008, normal f/u endoscopy 03/2009 with SBCE at that time   OSA on CPAP    Pain management    Pneumonia 09/01/2015   "on ATB still" (09/04/2015)   S/P colonoscopy 11/10, 10/08   Dr [name]   Schizophrenia [facility])    Sleep apnea    Stomach ulcer    Thyroid function test abnormal    Noted in 2011 discharge   Tobacco dipper    Wears contact lenses     Past Surgical  History:  Procedure Laterality Date   ANKLE FRACTURE SURGERY Left ~ 2008   BACK SURGERY     BIOPSY  11/02/2019   Procedure: BIOPSY;  Surgeon: Lavena Bullion, DO;  Location: [facility];  Service: Gastroenterology;;  EGD and Colon   BIOPSY  06/07/2020   Procedure: BIOPSY;  Surgeon: Lavena Bullion, DO;  Location: [facility];  Service: Gastroenterology;;   Franki [place] STUDY N/A 06/07/2020   Procedure: BRAVO [place] STUDY on PPI;  Surgeon: Lavena Bullion, DO;  Location: [facility];  Service: Gastroenterology;  Laterality: N/A;   COLONOSCOPY  10/10/2009   anal papilla otherwise normal   COLONOSCOPY WITH PROPOFOL N/A 11/02/2019   Procedure: COLONOSCOPY WITH PROPOFOL;  Surgeon: Lavena Bullion, DO;  Location: [facility];  Service: Gastroenterology;  Laterality: N/A;   ESOPHAGOGASTRODUODENOSCOPY  04/07/2009   Normal esophagus, small hiatal hernia   ESOPHAGOGASTRODUODENOSCOPY  01/09/2009   Distal esophageal erosions consistent with mild erosive reflux esophagitis, otherwise normal esophagus, small hiatal herniaotherwise normal stomach, D1-D2    ESOPHAGOGASTRODUODENOSCOPY  08/26/2007   Normal esophagus, a small hiatal/hernia, otherwise normal  stomach D1 through D3   ESOPHAGOGASTRODUODENOSCOPY (EGD) WITH PROPOFOL N/A 11/02/2019   Procedure: ESOPHAGOGASTRODUODENOSCOPY (EGD) WITH PROPOFOL;  Surgeon: Lavena Bullion, DO;  [facility]: [facility];  Service: Gastroenterology;  Laterality: N/A;   ESOPHAGOGASTRODUODENOSCOPY (EGD) WITH PROPOFOL N/A 06/07/2020   Procedure: ESOPHAGOGASTRODUODENOSCOPY (EGD) WITH PROPOFOL;  Surgeon: Lavena Bullion, DO;  [facility]: [facility];  Service: Gastroenterology;  Laterality: N/A;   FRACTURE SURGERY     GASTRIC BYPASS     GASTRIC ROUX-EN-Y N/A 06/05/2021   Procedure: LAPAROSCOPIC ROUX-EN-Y GASTRIC BYPASS WITH UPPER ENDOSCOPY;  Surgeon: Clovis Riley, MD;  Location: [facility];  Service: General;  Laterality: N/A;   ileocolonoscopy  08/26/2007    A  normal rectum, colon, and terminal ileum   INCISION AND DRAINAGE  09/04/2015   "reopened my back incsion"   LUMBAR LAMINECTOMY/DECOMPRESSION MICRODISCECTOMY Left 09/23/2017   Procedure: Microdiscectomy - left - Lumbar four-Lumbar five;  Surgeon: Earnie Larsson, MD;  Location: [place];  Service: Neurosurgery;  Laterality: Left;   LUMBAR MICRODISCECTOMY  08/21/2015   LUMBAR MICRODISCECTOMY Left 09/23/2017   L4-5   LUMBAR WOUND DEBRIDEMENT N/A 09/04/2015   Procedure: LUMBAR WOUND DEBRIDEMENT;  Surgeon: Consuella Lose, MD;  Location: [place] [facility];  Service: Neurosurgery;  Laterality: N/A;   right side sugery     enlarged lymph node gland removed under arm pit age 29-5 years   SHOULDER ARTHROSCOPY WITH DISTAL CLAVICLE RESECTION Right 11/22/2021   Procedure: SHOULDER ARTHROSCOPY WITH SUBACROMIAL DECOMPRESSION AND DISTAL CLAVICLE EXCISION;  Surgeon: Mordecai Rasmussen, MD;  Location: [facility];  Service: Orthopedics;  Laterality: Right;   Small bowel capsule  04/11/2009    normal throughout   VASECTOMY      There were no vitals filed for this visit.    Subjective Assessment - 12/13/21 1252     Subjective Patient reports that he had surgery on his right shoulder on 11/22/21. He notes that it has been hurting quite a bit since surgery with most of this being due to moving his arm. He was in a sling until 12/04/21. He was told not to reach overhead, but he could reach as long as it was not above shoulder height. He notes that ice helps with his pain, but he has not used it in the past 3-4 days.    Limitations Lifting    Patient Stated Goals return to work, raise his arm    Currently in Pain? Yes    Pain Score 9     Pain Location Shoulder    Pain Orientation Right    Pain Descriptors / Indicators Burning;Other (Comment);Pressure   Pulling   Pain Type Surgical pain    Pain Onset 1 to 4 weeks ago    Pain Frequency Constant    Aggravating Factors  moving his arm    Pain Relieving Factors ice, medication     Effect of Pain on Daily Activities unable to return to work                [facility] PT Assessment - 12/13/21 0001       Assessment   Medical Diagnosis Arthralgia of right [facility] joint    Referring Provider (PT) [name], MD    Onset Date/Surgical Date 11/22/21    Hand Dominance Right    Next MD Visit 01/01/22    Prior Therapy No      Precautions   Precautions Shoulder    Precaution Comments No reaching overhead, no lifting      Balance  Screen   Has the patient fallen in the past 6 months No    Has the patient had a decrease in activity level because of a fear of falling?  No    Is the patient reluctant to leave their home because of a fear of falling?  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, drive a truck    Leisure Social research officer, government, fishing      Cognition   Overall Cognitive Status Within [place] for tasks assessed    Attention Focused    Focused Attention Appears intact    Memory Appears intact    Awareness Appears intact    Problem Solving Appears intact      Observation/Other Assessments   Other Surveys  Quick Dash    Quick DASH  43.2      Sensation   Additional Comments Patient reports no numbness or tingling      ROM / Strength   AROM / PROM / Strength AROM;PROM;Strength      AROM   AROM Assessment Site Shoulder    Right/Left Shoulder Right;Left    Right Shoulder Flexion 57 Degrees   limited by pressure   Right Shoulder ABduction 65 Degrees   limited by pressure   Right Shoulder Internal Rotation --   to abdomen   Right Shoulder External Rotation 28 Degrees   limited by pulling   Left Shoulder Flexion 162 Degrees    Left Shoulder ABduction 158 Degrees    Left Shoulder Internal Rotation --   to T7   Left Shoulder External Rotation --   to T3     PROM   PROM Assessment Site Shoulder    Right/Left Shoulder Right    Right Shoulder  Flexion 124 Degrees   began to pull   Right Shoulder ABduction 143 Degrees   began pulling   Right Shoulder External Rotation 60 Degrees   at neutral; began pulling     Strength   Strength Assessment Site Shoulder    Right/Left Shoulder Right    Right Shoulder Flexion 2/5    Right Shoulder ABduction 2/5      Palpation   Palpation comment TTP: right infraspinatus, deltoid, biceps tendon, pec major and minor                        Objective measurements completed on examination: See above findings.                     PT Long Term Goals - 12/13/21 1252       PT LONG TERM GOAL #1   Title Patient will be independent with his HEP.    Time 7    Period Weeks    Status New    Target Date 01/31/22      PT LONG TERM GOAL #2   Title Patient will score a 30 or lower with his quick DASH for improved function using his right upper extremity.    Baseline 43    Time 7    Period Weeks    Status New    Target Date 01/31/22      PT LONG TERM GOAL #3   Title Patient will be able to demonstrate at least 130 degrees of active right shoulder flexion for improved function reaching overhead.    Time  7    Period Weeks    Status New    Target Date 01/31/22      PT LONG TERM GOAL #4   Title Patient will be able to demonstrate at least 130 degrees of active right shoulder abduction for improved function reaching overhead.    Time 7    Period Weeks    Status New    Target Date 01/31/22                    Plan - 12/13/21 1352     Clinical Impression Statement Patient is a 36 year old male presenting to physical therapy following right shoulder surgery on 11/22/21. He presented today with limited right shoulder AROM, but with 124 and 143 degrees of passive right shoulder flexion and abduction, respectively. Recommend that the continue with skilled physical therapy to address his remaining impairments to complete his daily activities without being limited  by his right shoulder.    Personal Factors and Comorbidities Profession;Comorbidity 1    Comorbidities OA    Examination-Activity Limitations Reach Overhead;Bathing;Self Feeding;Carry;Dressing;Lift    Examination-Participation Restrictions Occupation;Community Activity;Driving;Volunteer;Yard Work    Stability/Clinical Decision Making Stable/Uncomplicated    Designer, jewellery Low    Rehab Potential Excellent    PT Frequency 2x / week    PT Duration Other (comment)   7 weeks   PT Treatment/Interventions Therapeutic activities;Therapeutic exercise;Neuromuscular re-education;Manual techniques;ADLs/Self Care Home Management;Electrical Stimulation;Iontophoresis 4mg /ml Dexamethasone;Cryotherapy;Vasopneumatic Device    PT Next Visit Plan DOS: 11/22/21; see protocol    Consulted and Agree with Plan of Care Patient             Patient will benefit from skilled therapeutic intervention in order to improve the following deficits and impairments:  Decreased range of motion, Impaired UE functional use, Decreased activity tolerance, Pain, Decreased strength  Visit Diagnosis: Acute pain of right shoulder  Muscle weakness (generalized)     Problem List Patient Active Problem List   Diagnosis Date Noted   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   Obesity, morbid, BMI 40.0-49.9 ([place]) 06/05/2021   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   Gastritis and gastroduodenitis    Gastroesophageal reflux disease with esophagitis without hemorrhage 12/01/2019   Belching    Gastric polyps    Hematochezia    Irritable bowel syndrome with diarrhea    Grade II hemorrhoids    Polyp of ascending colon    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   Seasonal and perennial allergic rhinitis 07/23/2014   Abdominal mass of other site 06/15/2012   Abdominal cramps 06/15/2012   Obstructive sleep apnea 05/03/2012   Syncope and collapse 05/03/2012   Class 3 severe obesity with serious  comorbidity and body mass index (BMI) of 50.0 to 59.9 in adult ([place]) 05/03/2012   Anal fissure 05/27/2011   UNSPECIFIED ANEMIA 09/11/2009   Blood in stool 04/06/2009   Lower abdominal pain 03/07/2009   BIPOLAR DISORDER UNSPECIFIED 03/06/2009   SMOKELESS TOBACCO ABUSE 03/06/2009   Attention deficit hyperactivity disorder (ADHD) 03/06/2009   GERD 03/06/2009   Diarrhea 03/06/2009    Darlin Coco, PT 12/13/2021, 2:27 PM  [facility] [address] [place], [place], [address] Phone: [phone]   Fax:  [phone]  Name: [name] MRN: [id number] Date of Birth: [birth year]

## 2021-12-14 DIAGNOSIS — K912 Postsurgical malabsorption, not elsewhere classified: Secondary | ICD-10-CM | POA: Diagnosis not present

## 2021-12-14 DIAGNOSIS — E079 Disorder of thyroid, unspecified: Secondary | ICD-10-CM | POA: Diagnosis not present

## 2021-12-14 DIAGNOSIS — R5383 Other fatigue: Secondary | ICD-10-CM | POA: Diagnosis not present

## 2021-12-14 DIAGNOSIS — Z9884 Bariatric surgery status: Secondary | ICD-10-CM | POA: Diagnosis not present

## 2021-12-16 ENCOUNTER — Other Ambulatory Visit: Payer: Self-pay | Admitting: Family Medicine

## 2021-12-16 DIAGNOSIS — M5126 Other intervertebral disc displacement, lumbar region: Secondary | ICD-10-CM

## 2021-12-18 ENCOUNTER — Encounter: Payer: Self-pay | Admitting: Physical Therapy

## 2021-12-18 ENCOUNTER — Other Ambulatory Visit: Payer: Self-pay

## 2021-12-18 ENCOUNTER — Ambulatory Visit: Payer: Medicaid Other | Admitting: Physical Therapy

## 2021-12-18 DIAGNOSIS — M6281 Muscle weakness (generalized): Secondary | ICD-10-CM | POA: Diagnosis not present

## 2021-12-18 DIAGNOSIS — M25511 Pain in right shoulder: Secondary | ICD-10-CM | POA: Diagnosis not present

## 2021-12-18 DIAGNOSIS — Z09 Encounter for follow-up examination after completed treatment for conditions other than malignant neoplasm: Secondary | ICD-10-CM | POA: Diagnosis not present

## 2021-12-18 NOTE — Therapy (Signed)
[facility] [place], [place], [address] Phone: [phone]   Fax:  [phone]  Physical Therapy Treatment  Patient Details  Name: [name] MRN: [id number] Date of Birth: 06-23-1986 Referring Provider (PT): [name], MD   Encounter Date: 12/18/2021   PT End of Session - 12/18/21 0814     Visit Number 2    Number of Visits 14    Date for PT Re-Evaluation 02/22/22    PT Start Time 0818    PT Stop Time 2951    PT Time Calculation (min) 40 min    Activity Tolerance Patient tolerated treatment well    Behavior During Therapy [facility] for tasks assessed/performed             Past Medical History:  Diagnosis Date   Abscess    Right forearm healing   ADHD (attention deficit hyperactivity disorder)    Anal fissure    Anxiety    Arthritis    back, shoulders, ankles, knees   Back pain    Bipolar disorder (HCC)    Chest pain    Chronic lower back pain    Constipation    Depression    Esophagitis    Distal esophageal erosions consistent with mild erosive  reflux esophagitis 12/2008 by EGD    External hemorrhoids    Gastric polyps    Gastric ulcer    GERD (gastroesophageal reflux disease)    Hepatic steatosis    Hiatal hernia    History of stomach ulcers    Hx MRSA infection 06/2007   right thigh   Hx of colonic polyps    Hypercholesteremia    denies   Hypothyroidism    Internal hemorrhoids    Irritable bowel syndrome (IBS)    Obesity    Occult GI bleeding 12/2008   Trivial upper GI bleed/uncontrolled GERD by upper endoscopy 12/2008, normal f/u endoscopy 03/2009 with SBCE at that time   OSA on CPAP    Pain management    Pneumonia 09/01/2015   "on ATB still" (09/04/2015)   S/P colonoscopy 11/10, 10/08   Dr [name]   Schizophrenia [facility])    Sleep apnea    Stomach ulcer    Thyroid function test abnormal    Noted in 2011 discharge   Tobacco dipper    Wears contact lenses     Past Surgical  History:  Procedure Laterality Date   ANKLE FRACTURE SURGERY Left ~ 2008   BACK SURGERY     BIOPSY  11/02/2019   Procedure: BIOPSY;  Surgeon: Lavena Bullion, DO;  [facility];  Service: Gastroenterology;;  EGD and Colon   BIOPSY  06/07/2020   Procedure: BIOPSY;  Surgeon: Lavena Bullion, DO;  [facility];  Service: Gastroenterology;;   Franki [place] STUDY N/A 06/07/2020   Procedure: BRAVO [place] STUDY on PPI;  Surgeon: Lavena Bullion, DO;  [facility];  Service: Gastroenterology;  Laterality: N/A;   COLONOSCOPY  10/10/2009   anal papilla otherwise normal   COLONOSCOPY WITH PROPOFOL N/A 11/02/2019   Procedure: COLONOSCOPY WITH PROPOFOL;  Surgeon: Lavena Bullion, DO;  [facility];  Service: Gastroenterology;  Laterality: N/A;   ESOPHAGOGASTRODUODENOSCOPY  04/07/2009   Normal esophagus, small hiatal hernia   ESOPHAGOGASTRODUODENOSCOPY  01/09/2009   Distal esophageal erosions consistent with mild erosive reflux esophagitis, otherwise normal esophagus, small hiatal herniaotherwise normal stomach, D1-D2    ESOPHAGOGASTRODUODENOSCOPY  08/26/2007   Normal esophagus, a small hiatal/hernia, otherwise normal  stomach D1 through D3   ESOPHAGOGASTRODUODENOSCOPY (EGD) WITH PROPOFOL N/A 11/02/2019   Procedure: ESOPHAGOGASTRODUODENOSCOPY (EGD) WITH PROPOFOL;  Surgeon: Lavena Bullion, DO;  [facility];  Service: Gastroenterology;  Laterality: N/A;   ESOPHAGOGASTRODUODENOSCOPY (EGD) WITH PROPOFOL N/A 06/07/2020   Procedure: ESOPHAGOGASTRODUODENOSCOPY (EGD) WITH PROPOFOL;  Surgeon: Lavena Bullion, DO;  [facility];  Service: Gastroenterology;  Laterality: N/A;   FRACTURE SURGERY     GASTRIC BYPASS     GASTRIC ROUX-EN-Y N/A 06/05/2021   Procedure: LAPAROSCOPIC ROUX-EN-Y GASTRIC BYPASS WITH UPPER ENDOSCOPY;  Surgeon: Clovis Riley, MD;  [facility];  Service: General;  Laterality: N/A;   ileocolonoscopy  08/26/2007    A  normal rectum, colon, and terminal ileum   INCISION AND DRAINAGE  09/04/2015   "reopened my back incsion"   LUMBAR LAMINECTOMY/DECOMPRESSION MICRODISCECTOMY Left 09/23/2017   Procedure: Microdiscectomy - left - Lumbar four-Lumbar five;  Surgeon: Earnie Larsson, MD;  Location: [facility];  Service: Neurosurgery;  Laterality: Left;   LUMBAR MICRODISCECTOMY  08/21/2015   LUMBAR MICRODISCECTOMY Left 09/23/2017   L4-5   LUMBAR WOUND DEBRIDEMENT N/A 09/04/2015   Procedure: LUMBAR WOUND DEBRIDEMENT;  Surgeon: Consuella Lose, MD;  Location: [place];  Service: Neurosurgery;  Laterality: N/A;   right side sugery     enlarged lymph node gland removed under arm pit age 83-5 years   SHOULDER ARTHROSCOPY WITH DISTAL CLAVICLE RESECTION Right 11/22/2021   Procedure: SHOULDER ARTHROSCOPY WITH SUBACROMIAL DECOMPRESSION AND DISTAL CLAVICLE EXCISION;  Surgeon: Mordecai Rasmussen, MD;  [facility];  Service: Orthopedics;  Laterality: Right;   Small bowel capsule  04/11/2009    normal throughout   VASECTOMY      There were no vitals filed for this visit.   Subjective Assessment - 12/18/21 0814     Subjective Reports that he is constantly working his R shoulder but knows his limits from MD.    Limitations Lifting    Patient Stated Goals return to work, raise his arm    Currently in Pain? Yes    Pain Score 6     Pain Location Shoulder    Pain Orientation Right    Pain Descriptors / Indicators Discomfort    Pain Type Surgical pain    Pain Onset 1 to 4 weeks ago    Pain Frequency Constant                [facility] PT Assessment - 12/18/21 0001       Assessment   Medical Diagnosis Arthralgia of right AC joint    Referring Provider (PT) [name], MD    Onset Date/Surgical Date 11/22/21    Hand Dominance Right    Next MD Visit 01/01/22    Prior Therapy No      Precautions   Precautions Shoulder    Precaution Comments No reaching overhead, no lifting                            [facility] Adult PT Treatment/Exercise - 12/18/21 0001       Exercises   Exercises Shoulder      Shoulder Exercises: Supine   Protraction AAROM;Both;20 reps    External Rotation AAROM;Right;20 reps      Shoulder Exercises: Standing   Other Standing Exercises Wall slides into flexion over two wedges x20 reps      Shoulder Exercises: ROM/Strengthening   UBE (Upper Arm Bike) 120 RPM x6 min (forward/backward)  Ranger seated; flex/ext x2 min, CW and CCW circles x2 min each      Modalities   Modalities Vasopneumatic      Vasopneumatic   Number Minutes Vasopneumatic  10 minutes    Vasopnuematic Location  Shoulder    Vasopneumatic Pressure Low    Vasopneumatic Temperature  34      Manual Therapy   Manual Therapy Passive ROM    Passive ROM PROM of L shoulder into flex, ER with gentle holds at end range                          PT Long Term Goals - 12/18/21 0854       PT LONG TERM GOAL #1   Title Patient will be independent with his HEP.    Time 7    Period Weeks    Status On-going    Target Date 01/31/22      PT LONG TERM GOAL #2   Title Patient will score a 30 or lower with his quick DASH for improved function using his right upper extremity.    Baseline 43    Time 7    Period Weeks    Status On-going    Target Date 01/31/22      PT LONG TERM GOAL #3   Title Patient will be able to demonstrate at least 130 degrees of active right shoulder flexion for improved function reaching overhead.    Time 7    Period Weeks    Status On-going    Target Date 01/31/22      PT LONG TERM GOAL #4   Title Patient will be able to demonstrate at least 130 degrees of active right shoulder abduction for improved function reaching overhead.    Time 7    Period Weeks    Status On-going    Target Date 01/31/22                   Plan - 12/18/21 0856     Clinical Impression Statement Patient presented in clinic with reports of moderate R shoulder pain. Patient  reports that he is constantly moving the R shoulder while at home. Patient able to tolerate all therex well with no complaints of any increased pain. Patient required multimodal cueing in order to assist with technique correction. Firm end feels and smooth arc of motion noted during PROM of R shoulder. Normal vasopneumatic response noted following removal of the modality.    Personal Factors and Comorbidities Profession;Comorbidity 1    Comorbidities OA    Examination-Activity Limitations Reach Overhead;Bathing;Self Feeding;Carry;Dressing;Lift    Examination-Participation Restrictions Occupation;Community Activity;Driving;Volunteer;Yard Work    Stability/Clinical Decision Making Stable/Uncomplicated    Rehab Potential Excellent    PT Frequency 2x / week    PT Duration Other (comment)    PT Treatment/Interventions Therapeutic activities;Therapeutic exercise;Neuromuscular re-education;Manual techniques;ADLs/Self Care Home Management;Electrical Stimulation;Iontophoresis 4mg /ml Dexamethasone;Cryotherapy;Vasopneumatic Device    PT Next Visit Plan DOS: 11/22/21; see protocol    Consulted and Agree with Plan of Care Patient             Patient will benefit from skilled therapeutic intervention in order to improve the following deficits and impairments:  Decreased range of motion, Impaired UE functional use, Decreased activity tolerance, Pain, Decreased strength  Visit Diagnosis: Acute pain of right shoulder  Muscle weakness (generalized)     Problem List Patient Active Problem List   Diagnosis Date Noted   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   Obesity, morbid, BMI 40.0-49.9 ([place]) 06/05/2021   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   Gastritis and gastroduodenitis    Gastroesophageal reflux disease with esophagitis without hemorrhage 12/01/2019   Belching    Gastric polyps    Hematochezia    Irritable bowel syndrome with diarrhea    Grade II  hemorrhoids    Polyp of ascending colon    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   Seasonal and perennial allergic rhinitis 07/23/2014   Abdominal mass of other site 06/15/2012   Abdominal cramps 06/15/2012   Obstructive sleep apnea 05/03/2012   Syncope and collapse 05/03/2012   Class 3 severe obesity with serious comorbidity and body mass index (BMI) of 50.0 to 59.9 in adult ([place]) 05/03/2012   Anal fissure 05/27/2011   UNSPECIFIED ANEMIA 09/11/2009   Blood in stool 04/06/2009   Lower abdominal pain 03/07/2009   BIPOLAR DISORDER UNSPECIFIED 03/06/2009   SMOKELESS TOBACCO ABUSE 03/06/2009   Attention deficit hyperactivity disorder (ADHD) 03/06/2009   GERD 03/06/2009   Diarrhea 03/06/2009    Standley Brooking, PTA 12/18/2021, 9:50 AM  [facility] [address] [place], [place], [address] Phone: [phone]   Fax:  [phone]  Name: MCKINNLEY SMITHEY MRN: [id number] Date of Birth: December 07, 1985

## 2021-12-21 ENCOUNTER — Ambulatory Visit: Payer: Medicaid Other | Admitting: *Deleted

## 2021-12-21 ENCOUNTER — Other Ambulatory Visit: Payer: Self-pay

## 2021-12-21 DIAGNOSIS — M6281 Muscle weakness (generalized): Secondary | ICD-10-CM

## 2021-12-21 DIAGNOSIS — Z09 Encounter for follow-up examination after completed treatment for conditions other than malignant neoplasm: Secondary | ICD-10-CM | POA: Diagnosis not present

## 2021-12-21 DIAGNOSIS — M25511 Pain in right shoulder: Secondary | ICD-10-CM

## 2021-12-21 NOTE — Therapy (Signed)
[facility] [place], [place], [address] Phone: [phone]   Fax:  [phone]  Physical Therapy Treatment  Patient Details  Name: [name] MRN: [id number] Date of Birth: 1986-07-10 Referring Provider (PT): [name], MD   Encounter Date: 12/21/2021   PT End of Session - 12/21/21 0855     Visit Number 3    Number of Visits 14    Date for PT Re-Evaluation 02/22/22    PT Start Time 0815    PT Stop Time 0905    PT Time Calculation (min) 50 min             Past Medical History:  Diagnosis Date   Abscess    Right forearm healing   ADHD (attention deficit hyperactivity disorder)    Anal fissure    Anxiety    Arthritis    back, shoulders, ankles, knees   Back pain    Bipolar disorder (HCC)    Chest pain    Chronic lower back pain    Constipation    Depression    Esophagitis    Distal esophageal erosions consistent with mild erosive  reflux esophagitis 12/2008 by EGD    External hemorrhoids    Gastric polyps    Gastric ulcer    GERD (gastroesophageal reflux disease)    Hepatic steatosis    Hiatal hernia    History of stomach ulcers    Hx MRSA infection 06/2007   right thigh   Hx of colonic polyps    Hypercholesteremia    denies   Hypothyroidism    Internal hemorrhoids    Irritable bowel syndrome (IBS)    Obesity    Occult GI bleeding 12/2008   Trivial upper GI bleed/uncontrolled GERD by upper endoscopy 12/2008, normal f/u endoscopy 03/2009 with SBCE at that time   OSA on CPAP    Pain management    Pneumonia 09/01/2015   "on ATB still" (09/04/2015)   S/P colonoscopy 11/10, 10/08   Dr [name]   Schizophrenia [facility])    Sleep apnea    Stomach ulcer    Thyroid function test abnormal    Noted in 2011 discharge   Tobacco dipper    Wears contact lenses     Past Surgical History:  Procedure Laterality Date   ANKLE FRACTURE SURGERY Left ~ 2008   BACK SURGERY     BIOPSY  11/02/2019    Procedure: BIOPSY;  Surgeon: Lavena Bullion, DO;  [facility];  Service: Gastroenterology;;  EGD and Colon   BIOPSY  06/07/2020   Procedure: BIOPSY;  Surgeon: Lavena Bullion, DO;  [facility];  Service: Gastroenterology;;   Franki [place] STUDY N/A 06/07/2020   Procedure: BRAVO [place] STUDY on PPI;  Surgeon: Lavena Bullion, DO;  [facility];  Service: Gastroenterology;  Laterality: N/A;   COLONOSCOPY  10/10/2009   anal papilla otherwise normal   COLONOSCOPY WITH PROPOFOL N/A 11/02/2019   Procedure: COLONOSCOPY WITH PROPOFOL;  Surgeon: Lavena Bullion, DO;  [facility];  Service: Gastroenterology;  Laterality: N/A;   ESOPHAGOGASTRODUODENOSCOPY  04/07/2009   Normal esophagus, small hiatal hernia   ESOPHAGOGASTRODUODENOSCOPY  01/09/2009   Distal esophageal erosions consistent with mild erosive reflux esophagitis, otherwise normal esophagus, small hiatal herniaotherwise normal stomach, D1-D2    ESOPHAGOGASTRODUODENOSCOPY  08/26/2007   Normal esophagus, a small hiatal/hernia, otherwise normal stomach D1 through D3   ESOPHAGOGASTRODUODENOSCOPY (EGD) WITH PROPOFOL N/A 11/02/2019   Procedure: ESOPHAGOGASTRODUODENOSCOPY (EGD) WITH PROPOFOL;  Surgeon: Lavena Bullion, DO;  [facility];  Service: Gastroenterology;  Laterality: N/A;   ESOPHAGOGASTRODUODENOSCOPY (EGD) WITH PROPOFOL N/A 06/07/2020   Procedure: ESOPHAGOGASTRODUODENOSCOPY (EGD) WITH PROPOFOL;  Surgeon: Lavena Bullion, DO;  [facility];  Service: Gastroenterology;  Laterality: N/A;   FRACTURE SURGERY     GASTRIC BYPASS     GASTRIC ROUX-EN-Y N/A 06/05/2021   Procedure: LAPAROSCOPIC ROUX-EN-Y GASTRIC BYPASS WITH UPPER ENDOSCOPY;  Surgeon: Clovis Riley, MD;  [facility];  Service: General;  Laterality: N/A;   ileocolonoscopy  08/26/2007    A normal rectum, colon, and terminal ileum   INCISION AND DRAINAGE  09/04/2015   "reopened my back incsion"   LUMBAR  LAMINECTOMY/DECOMPRESSION MICRODISCECTOMY Left 09/23/2017   Procedure: Microdiscectomy - left - Lumbar four-Lumbar five;  Surgeon: Earnie Larsson, MD;  Location: [facility];  Service: Neurosurgery;  Laterality: Left;   LUMBAR MICRODISCECTOMY  08/21/2015   LUMBAR MICRODISCECTOMY Left 09/23/2017   L4-5   LUMBAR WOUND DEBRIDEMENT N/A 09/04/2015   Procedure: LUMBAR WOUND DEBRIDEMENT;  Surgeon: Consuella Lose, MD;  Location: [facility];  Service: Neurosurgery;  Laterality: N/A;   right side sugery     enlarged lymph node gland removed under arm pit age 74-5 years   SHOULDER ARTHROSCOPY WITH DISTAL CLAVICLE RESECTION Right 11/22/2021   Procedure: SHOULDER ARTHROSCOPY WITH SUBACROMIAL DECOMPRESSION AND DISTAL CLAVICLE EXCISION;  Surgeon: Mordecai Rasmussen, MD;  [facility];  Service: Orthopedics;  Laterality: Right;   Small bowel capsule  04/11/2009    normal throughout   VASECTOMY      There were no vitals filed for this visit.   Subjective Assessment - 12/21/21 0811     Subjective Reports that he over did it yesterday. Shldr 6/10 today    Limitations Lifting    Patient Stated Goals return to work, raise his arm    Currently in Pain? Yes    Pain Score 6     Pain Orientation Right    Pain Descriptors / Indicators Discomfort    Pain Type Surgical pain    Pain Onset 1 to 4 weeks ago                               [facility] Adult PT Treatment/Exercise - 12/21/21 0001       Exercises   Exercises Shoulder      Shoulder Exercises: Supine   Protraction AAROM;Both;20 reps    External Rotation AAROM;Right;20 reps      Shoulder Exercises: Pulleys   Flexion 5 minutes      Shoulder Exercises: ROM/Strengthening   UBE (Upper Arm Bike) 120 RPM x8 min (forward/backward)    Ranger --      Modalities   Modalities Vasopneumatic      Vasopneumatic   Number Minutes Vasopneumatic  15 minutes    Vasopnuematic Location  Shoulder    Vasopneumatic Pressure Low    Vasopneumatic  Temperature  34      Manual Therapy   Manual Therapy Passive ROM    Manual therapy comments Flexion to 150 degrees, ER to 60 degrees    Passive ROM PROM of L shoulder into flex, ER with gentle holds at end range                          PT Long Term Goals - 12/18/21 0854       PT LONG  TERM GOAL #1   Title Patient will be independent with his HEP.    Time 7    Period Weeks    Status On-going    Target Date 01/31/22      PT LONG TERM GOAL #2   Title Patient will score a 30 or lower with his quick DASH for improved function using his right upper extremity.    Baseline 43    Time 7    Period Weeks    Status On-going    Target Date 01/31/22      PT LONG TERM GOAL #3   Title Patient will be able to demonstrate at least 130 degrees of active right shoulder flexion for improved function reaching overhead.    Time 7    Period Weeks    Status On-going    Target Date 01/31/22      PT LONG TERM GOAL #4   Title Patient will be able to demonstrate at least 130 degrees of active right shoulder abduction for improved function reaching overhead.    Time 7    Period Weeks    Status On-going    Target Date 01/31/22                   Plan - 12/21/21 0814     Clinical Impression Statement Pt arrived today with increased soreness RT shldr from over doing it at home. Pt was able to continue with AAROM and PROM to RT shldr for elevation and ER. PROM to flexion 150 degrees,and ER to 50 degrees. Vaso end of session for edema.    Personal Factors and Comorbidities Profession;Comorbidity 1    Examination-Activity Limitations Reach Overhead;Bathing;Self Feeding;Carry;Dressing;Lift    Stability/Clinical Decision Making Stable/Uncomplicated    PT Treatment/Interventions Therapeutic activities;Therapeutic exercise;Neuromuscular re-education;Manual techniques;ADLs/Self Care Home Management;Electrical Stimulation;Iontophoresis 4mg /ml Dexamethasone;Cryotherapy;Vasopneumatic  Device    PT Next Visit Plan DOS: 11/22/21; see protocol    Consulted and Agree with Plan of Care Patient             Patient will benefit from skilled therapeutic intervention in order to improve the following deficits and impairments:  Decreased range of motion, Impaired UE functional use, Decreased activity tolerance, Pain, Decreased strength  Visit Diagnosis: Acute pain of right shoulder  Muscle weakness (generalized)     Problem List Patient Active Problem List   Diagnosis Date Noted   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   Obesity, morbid, BMI 40.0-49.9 ([place]) 06/05/2021   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   Gastritis and gastroduodenitis    Gastroesophageal reflux disease with esophagitis without hemorrhage 12/01/2019   Belching    Gastric polyps    Hematochezia    Irritable bowel syndrome with diarrhea    Grade II hemorrhoids    Polyp of ascending colon    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   Seasonal and perennial allergic rhinitis 07/23/2014   Abdominal mass of other site 06/15/2012   Abdominal cramps 06/15/2012   Obstructive sleep apnea 05/03/2012   Syncope and collapse 05/03/2012   Class 3 severe obesity with serious comorbidity and body mass index (BMI) of 50.0 to 59.9 in adult ([place]) 05/03/2012   Anal fissure 05/27/2011   UNSPECIFIED ANEMIA 09/11/2009   Blood in stool 04/06/2009   Lower abdominal pain 03/07/2009   BIPOLAR DISORDER UNSPECIFIED 03/06/2009   SMOKELESS TOBACCO ABUSE 03/06/2009   Attention deficit hyperactivity disorder (ADHD) 03/06/2009   GERD 03/06/2009   Diarrhea 03/06/2009    Meredyth Hornung,CHRIS, PTA 12/21/2021,  10:18 AM  [facility] [place], [place], [address] Phone: [phone]   Fax:  [phone]  Name: [name] MRN: [id number] Date of Birth: [birth year]

## 2021-12-25 ENCOUNTER — Telehealth: Payer: Self-pay | Admitting: Family Medicine

## 2021-12-25 ENCOUNTER — Ambulatory Visit: Payer: Medicaid Other

## 2021-12-25 ENCOUNTER — Other Ambulatory Visit: Payer: Self-pay | Admitting: Family Medicine

## 2021-12-25 ENCOUNTER — Other Ambulatory Visit: Payer: Self-pay

## 2021-12-25 DIAGNOSIS — F902 Attention-deficit hyperactivity disorder, combined type: Secondary | ICD-10-CM

## 2021-12-25 DIAGNOSIS — M25511 Pain in right shoulder: Secondary | ICD-10-CM | POA: Diagnosis not present

## 2021-12-25 DIAGNOSIS — M6281 Muscle weakness (generalized): Secondary | ICD-10-CM | POA: Diagnosis not present

## 2021-12-25 DIAGNOSIS — Z09 Encounter for follow-up examination after completed treatment for conditions other than malignant neoplasm: Secondary | ICD-10-CM | POA: Diagnosis not present

## 2021-12-25 MED ORDER — LISDEXAMFETAMINE DIMESYLATE 20 MG PO CAPS: 20.0000 mg | ORAL_CAPSULE | Freq: Every day | ORAL | 0 refills | Status: DC

## 2021-12-25 NOTE — Telephone Encounter (Signed)
Pt ok with going back on vvyanse

## 2021-12-25 NOTE — Telephone Encounter (Signed)
Pt would like to know if there is an alternative medicine for him to take to replace amphetamine-dextroamphetamine (ADDERALL) 20 MG tablet because [facility] In [place] is out. Please call back and advise.

## 2021-12-25 NOTE — Telephone Encounter (Signed)
I have reduced him to Vyvanse 20mg .  Will titrate up at follow up visit if needed

## 2021-12-25 NOTE — Telephone Encounter (Signed)
Not in immediate release (which is what he needs due to gastric bypass). We could try going back to Vyvanse at a much lower dose since it is long acting.

## 2021-12-25 NOTE — Therapy (Signed)
[facility] [place], [place], [address] Phone: [phone]   Fax:  [phone]  Physical Therapy Treatment  Patient Details  Name: [name] MRN: [id number] Date of Birth: 1986/08/08 Referring Provider (PT): [name], MD   Encounter Date: 12/25/2021   PT End of Session - 12/25/21 0809     Visit Number 4    Number of Visits 14    Date for PT Re-Evaluation 02/22/22    Authorization - Number of Visits 16    PT Start Time 0815    PT Stop Time 0911    PT Time Calculation (min) 56 min    Activity Tolerance Patient tolerated treatment well    Behavior During Therapy [facility] for tasks assessed/performed             Past Medical History:  Diagnosis Date   Abscess    Right forearm healing   ADHD (attention deficit hyperactivity disorder)    Anal fissure    Anxiety    Arthritis    back, shoulders, ankles, knees   Back pain    Bipolar disorder (HCC)    Chest pain    Chronic lower back pain    Constipation    Depression    Esophagitis    Distal esophageal erosions consistent with mild erosive  reflux esophagitis 12/2008 by EGD    External hemorrhoids    Gastric polyps    Gastric ulcer    GERD (gastroesophageal reflux disease)    Hepatic steatosis    Hiatal hernia    History of stomach ulcers    Hx MRSA infection 06/2007   right thigh   Hx of colonic polyps    Hypercholesteremia    denies   Hypothyroidism    Internal hemorrhoids    Irritable bowel syndrome (IBS)    Obesity    Occult GI bleeding 12/2008   Trivial upper GI bleed/uncontrolled GERD by upper endoscopy 12/2008, normal f/u endoscopy 03/2009 with SBCE at that time   OSA on CPAP    Pain management    Pneumonia 09/01/2015   "on ATB still" (09/04/2015)   S/P colonoscopy 11/10, 10/08   Dr [name]   Schizophrenia [facility])    Sleep apnea    Stomach ulcer    Thyroid function test abnormal    Noted in 2011 discharge   Tobacco dipper    Wears  contact lenses     Past Surgical History:  Procedure Laterality Date   ANKLE FRACTURE SURGERY Left ~ 2008   BACK SURGERY     BIOPSY  11/02/2019   Procedure: BIOPSY;  Surgeon: Lavena Bullion, DO;  [facility]: [facility];  Service: Gastroenterology;;  EGD and Colon   BIOPSY  06/07/2020   Procedure: BIOPSY;  Surgeon: Lavena Bullion, DO;  [facility];  Service: Gastroenterology;;   Franki Monte [place] STUDY N/A 06/07/2020   Procedure: BRAVO [place] STUDY on PPI;  Surgeon: Lavena Bullion, DO;  [facility];  Service: Gastroenterology;  Laterality: N/A;   COLONOSCOPY  10/10/2009   anal papilla otherwise normal   COLONOSCOPY WITH PROPOFOL N/A 11/02/2019   Procedure: COLONOSCOPY WITH PROPOFOL;  Surgeon: Lavena Bullion, DO;  [facility];  Service: Gastroenterology;  Laterality: N/A;   ESOPHAGOGASTRODUODENOSCOPY  04/07/2009   Normal esophagus, small hiatal hernia   ESOPHAGOGASTRODUODENOSCOPY  01/09/2009   Distal esophageal erosions consistent with mild erosive reflux esophagitis, otherwise normal esophagus, small hiatal herniaotherwise normal stomach, D1-D2    ESOPHAGOGASTRODUODENOSCOPY  08/26/2007  Normal esophagus, a small hiatal/hernia, otherwise normal stomach D1 through D3   ESOPHAGOGASTRODUODENOSCOPY (EGD) WITH PROPOFOL N/A 11/02/2019   Procedure: ESOPHAGOGASTRODUODENOSCOPY (EGD) WITH PROPOFOL;  Surgeon: Lavena Bullion, DO;  [facility];  Service: Gastroenterology;  Laterality: N/A;   ESOPHAGOGASTRODUODENOSCOPY (EGD) WITH PROPOFOL N/A 06/07/2020   Procedure: ESOPHAGOGASTRODUODENOSCOPY (EGD) WITH PROPOFOL;  Surgeon: Lavena Bullion, DO;  [facility];  Service: Gastroenterology;  Laterality: N/A;   FRACTURE SURGERY     GASTRIC BYPASS     GASTRIC ROUX-EN-Y N/A 06/05/2021   Procedure: LAPAROSCOPIC ROUX-EN-Y GASTRIC BYPASS WITH UPPER ENDOSCOPY;  Surgeon: Clovis Riley, MD;  [facility];  Service: General;  Laterality: N/A;    ileocolonoscopy  08/26/2007    A normal rectum, colon, and terminal ileum   INCISION AND DRAINAGE  09/04/2015   "reopened my back incsion"   LUMBAR LAMINECTOMY/DECOMPRESSION MICRODISCECTOMY Left 09/23/2017   Procedure: Microdiscectomy - left - Lumbar four-Lumbar five;  Surgeon: Earnie Larsson, MD;  Location: [place];  Service: Neurosurgery;  Laterality: Left;   LUMBAR MICRODISCECTOMY  08/21/2015   LUMBAR MICRODISCECTOMY Left 09/23/2017   L4-5   LUMBAR WOUND DEBRIDEMENT N/A 09/04/2015   Procedure: LUMBAR WOUND DEBRIDEMENT;  Surgeon: Consuella Lose, MD;  Location: [place] [facility];  Service: Neurosurgery;  Laterality: N/A;   right side sugery     enlarged lymph node gland removed under arm pit age 15-5 years   SHOULDER ARTHROSCOPY WITH DISTAL CLAVICLE RESECTION Right 11/22/2021   Procedure: SHOULDER ARTHROSCOPY WITH SUBACROMIAL DECOMPRESSION AND DISTAL CLAVICLE EXCISION;  Surgeon: Mordecai Rasmussen, MD;  [facility];  Service: Orthopedics;  Laterality: Right;   Small bowel capsule  04/11/2009    normal throughout   VASECTOMY      There were no vitals filed for this visit.   Subjective Assessment - 12/25/21 0816     Subjective Patient reports that his shoulder feels better today than it did at his last appointment as it is only about a 4.5/10 today.    Limitations Lifting    Patient Stated Goals return to work, raise his arm    Currently in Pain? Yes    Pain Score 4     Pain Location Shoulder    Pain Orientation Right    Pain Type Surgical pain    Pain Onset 1 to 4 weeks ago                               [facility] Adult PT Treatment/Exercise - 12/25/21 0001       Shoulder Exercises: Standing   External Rotation AAROM;Right;20 reps      Shoulder Exercises: Pulleys   Flexion Other (comment)   4 minutes     Shoulder Exercises: ROM/Strengthening   UBE (Upper Arm Bike) 120 RPM x 10 minutes (forward/backward)      Shoulder Exercises: Isometric Strengthening    Flexion Other (comment)   2x10 reps; 5 second hold   Extension Other (comment)   2x10 reps; 5 second hold   External Rotation Other (comment)   2x10 reps; 5 second hold   Internal Rotation Other (comment)   2x10 reps; 5 second hold   ABduction Other (comment)   2x10 reps; 5 second hold     Modalities   Modalities Vasopneumatic      Vasopneumatic   Number Minutes Vasopneumatic  10 minutes    Vasopnuematic Location  Shoulder    Vasopneumatic Pressure Low  Vasopneumatic Temperature  34      Manual Therapy   Manual Therapy Soft tissue mobilization;Passive ROM    Soft tissue mobilization deltoid and supraspinatus    Passive ROM R shoulder flexion, abduction, and ER to tolerance                          PT Long Term Goals - 12/18/21 0854       PT LONG TERM GOAL #1   Title Patient will be independent with his HEP.    Time 7    Period Weeks    Status On-going    Target Date 01/31/22      PT LONG TERM GOAL #2   Title Patient will score a 30 or lower with his quick DASH for improved function using his right upper extremity.    Baseline 43    Time 7    Period Weeks    Status On-going    Target Date 01/31/22      PT LONG TERM GOAL #3   Title Patient will be able to demonstrate at least 130 degrees of active right shoulder flexion for improved function reaching overhead.    Time 7    Period Weeks    Status On-going    Target Date 01/31/22      PT LONG TERM GOAL #4   Title Patient will be able to demonstrate at least 130 degrees of active right shoulder abduction for improved function reaching overhead.    Time 7    Period Weeks    Status On-going    Target Date 01/31/22                   Plan - 12/25/21 0809     Clinical Impression Statement Patient was introduced to multiple new isometric interventions with minimal difficulty and moderate muscular fatigue. He required minimal cuing for proper positioning and exercise performance initially, but  he was then able to maintain proper performance throughout. He reported no significant shoulder pain or discomfort with any of today's interventions. Manual therapy focused on improved shoulder mobility through the use of soft tissue mobilization to the deltoid and supraspinatus followed by PROM. He reported that his shoulder felt good upon the conclusion of treatment. He continues to require skilled physical therapy to address his remaining impairments to return to his prior level of function.    Personal Factors and Comorbidities Profession;Comorbidity 1    Examination-Activity Limitations Reach Overhead;Bathing;Self Feeding;Carry;Dressing;Lift    Stability/Clinical Decision Making Stable/Uncomplicated    PT Treatment/Interventions Therapeutic activities;Therapeutic exercise;Neuromuscular re-education;Manual techniques;ADLs/Self Care Home Management;Electrical Stimulation;Iontophoresis 4mg /ml Dexamethasone;Cryotherapy;Vasopneumatic Device    PT Next Visit Plan DOS: 11/22/21; see protocol    Consulted and Agree with Plan of Care Patient             Patient will benefit from skilled therapeutic intervention in order to improve the following deficits and impairments:  Decreased range of motion, Impaired UE functional use, Decreased activity tolerance, Pain, Decreased strength  Visit Diagnosis: Acute pain of right shoulder  Muscle weakness (generalized)     Problem List Patient Active Problem List   Diagnosis Date Noted   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   Obesity, morbid, BMI 40.0-49.9 ([place]) 06/05/2021   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   Gastritis and gastroduodenitis    Gastroesophageal reflux disease with esophagitis without hemorrhage 12/01/2019   Belching    Gastric polyps    Hematochezia  Irritable bowel syndrome with diarrhea    Grade II hemorrhoids    Polyp of ascending colon    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   Seasonal and  perennial allergic rhinitis 07/23/2014   Abdominal mass of other site 06/15/2012   Abdominal cramps 06/15/2012   Obstructive sleep apnea 05/03/2012   Syncope and collapse 05/03/2012   Class 3 severe obesity with serious comorbidity and body mass index (BMI) of 50.0 to 59.9 in adult ([place]) 05/03/2012   Anal fissure 05/27/2011   UNSPECIFIED ANEMIA 09/11/2009   Blood in stool 04/06/2009   Lower abdominal pain 03/07/2009   BIPOLAR DISORDER UNSPECIFIED 03/06/2009   SMOKELESS TOBACCO ABUSE 03/06/2009   Attention deficit hyperactivity disorder (ADHD) 03/06/2009   GERD 03/06/2009   Diarrhea 03/06/2009    Darlin Coco, PT 12/25/2021, 12:29 PM  [facility] [address] [place], [place], [address] Phone: [phone]   Fax:  [phone]  Name: [name] MRN: [id number] Date of Birth: [name] 18, 1986

## 2021-12-26 NOTE — Telephone Encounter (Signed)
Pt aware.

## 2021-12-27 ENCOUNTER — Ambulatory Visit: Payer: Medicaid Other | Attending: Orthopedic Surgery

## 2021-12-27 ENCOUNTER — Other Ambulatory Visit: Payer: Self-pay

## 2021-12-27 DIAGNOSIS — M25511 Pain in right shoulder: Secondary | ICD-10-CM | POA: Diagnosis not present

## 2021-12-27 DIAGNOSIS — M6281 Muscle weakness (generalized): Secondary | ICD-10-CM | POA: Insufficient documentation

## 2021-12-27 NOTE — Therapy (Signed)
[facility] [place], [place], [address] Phone: [phone]   Fax:  [phone]  Physical Therapy Treatment  Patient Details  Name: [name] MRN: [id number] Date of Birth: 05/06/1986 Referring Provider (PT): [name], MD   Encounter Date: 12/27/2021   PT End of Session - 12/27/21 0814     Visit Number 5    Number of Visits 14    Date for PT Re-Evaluation 02/22/22    Authorization - Number of Visits 16    PT Start Time 0815    PT Stop Time 0904    PT Time Calculation (min) 49 min    Activity Tolerance Patient tolerated treatment well    Behavior During Therapy [facility] for tasks assessed/performed             Past Medical History:  Diagnosis Date   Abscess    Right forearm healing   ADHD (attention deficit hyperactivity disorder)    Anal fissure    Anxiety    Arthritis    back, shoulders, ankles, knees   Back pain    Bipolar disorder (HCC)    Chest pain    Chronic lower back pain    Constipation    Depression    Esophagitis    Distal esophageal erosions consistent with mild erosive  reflux esophagitis 12/2008 by EGD    External hemorrhoids    Gastric polyps    Gastric ulcer    GERD (gastroesophageal reflux disease)    Hepatic steatosis    Hiatal hernia    History of stomach ulcers    Hx MRSA infection 06/2007   right thigh   Hx of colonic polyps    Hypercholesteremia    denies   Hypothyroidism    Internal hemorrhoids    Irritable bowel syndrome (IBS)    Obesity    Occult GI bleeding 12/2008   Trivial upper GI bleed/uncontrolled GERD by upper endoscopy 12/2008, normal f/u endoscopy 03/2009 with SBCE at that time   OSA on CPAP    Pain management    Pneumonia 09/01/2015   "on ATB still" (09/04/2015)   S/P colonoscopy 11/10, 10/08   Dr [name]   Schizophrenia [facility])    Sleep apnea    Stomach ulcer    Thyroid function test abnormal    Noted in 2011 discharge   Tobacco dipper    Wears  contact lenses     Past Surgical History:  Procedure Laterality Date   ANKLE FRACTURE SURGERY Left ~ 2008   BACK SURGERY     BIOPSY  11/02/2019   Procedure: BIOPSY;  Surgeon: Lavena Bullion, DO;  [facility];  Service: Gastroenterology;;  EGD and Colon   BIOPSY  06/07/2020   Procedure: BIOPSY;  Surgeon: Lavena Bullion, DO;  [facility];  Service: Gastroenterology;;   Franki Monte [place] STUDY N/A 06/07/2020   Procedure: BRAVO [place] STUDY on PPI;  Surgeon: Lavena Bullion, DO;  [facility];  Service: Gastroenterology;  Laterality: N/A;   COLONOSCOPY  10/10/2009   anal papilla otherwise normal   COLONOSCOPY WITH PROPOFOL N/A 11/02/2019   Procedure: COLONOSCOPY WITH PROPOFOL;  Surgeon: Lavena Bullion, DO;  [facility];  Service: Gastroenterology;  Laterality: N/A;   ESOPHAGOGASTRODUODENOSCOPY  04/07/2009   Normal esophagus, small hiatal hernia   ESOPHAGOGASTRODUODENOSCOPY  01/09/2009   Distal esophageal erosions consistent with mild erosive reflux esophagitis, otherwise normal esophagus, small hiatal herniaotherwise normal stomach, D1-D2    ESOPHAGOGASTRODUODENOSCOPY  08/26/2007  Normal esophagus, a small hiatal/hernia, otherwise normal stomach D1 through D3   ESOPHAGOGASTRODUODENOSCOPY (EGD) WITH PROPOFOL N/A 11/02/2019   Procedure: ESOPHAGOGASTRODUODENOSCOPY (EGD) WITH PROPOFOL;  Surgeon: Lavena Bullion, DO;  [facility];  Service: Gastroenterology;  Laterality: N/A;   ESOPHAGOGASTRODUODENOSCOPY (EGD) WITH PROPOFOL N/A 06/07/2020   Procedure: ESOPHAGOGASTRODUODENOSCOPY (EGD) WITH PROPOFOL;  Surgeon: Lavena Bullion, DO;  [facility];  Service: Gastroenterology;  Laterality: N/A;   FRACTURE SURGERY     GASTRIC BYPASS     GASTRIC ROUX-EN-Y N/A 06/05/2021   Procedure: LAPAROSCOPIC ROUX-EN-Y GASTRIC BYPASS WITH UPPER ENDOSCOPY;  Surgeon: Clovis Riley, MD;  [facility];  Service: General;  Laterality: N/A;    ileocolonoscopy  08/26/2007    A normal rectum, colon, and terminal ileum   INCISION AND DRAINAGE  09/04/2015   "reopened my back incsion"   LUMBAR LAMINECTOMY/DECOMPRESSION MICRODISCECTOMY Left 09/23/2017   Procedure: Microdiscectomy - left - Lumbar four-Lumbar five;  Surgeon: Earnie Larsson, MD;  Location: [place];  Service: Neurosurgery;  Laterality: Left;   LUMBAR MICRODISCECTOMY  08/21/2015   LUMBAR MICRODISCECTOMY Left 09/23/2017   L4-5   LUMBAR WOUND DEBRIDEMENT N/A 09/04/2015   Procedure: LUMBAR WOUND DEBRIDEMENT;  Surgeon: Consuella Lose, MD;  Location: [place] [facility];  Service: Neurosurgery;  Laterality: N/A;   right side sugery     enlarged lymph node gland removed under arm pit age 39-5 years   SHOULDER ARTHROSCOPY WITH DISTAL CLAVICLE RESECTION Right 11/22/2021   Procedure: SHOULDER ARTHROSCOPY WITH SUBACROMIAL DECOMPRESSION AND DISTAL CLAVICLE EXCISION;  Surgeon: Mordecai Rasmussen, MD;  [facility];  Service: Orthopedics;  Laterality: Right;   Small bowel capsule  04/11/2009    normal throughout   VASECTOMY      There were no vitals filed for this visit.   Subjective Assessment - 12/27/21 0815     Subjective Patient reports that his shoulder feels alright today.    Limitations Lifting    Patient Stated Goals return to work, raise his arm    Currently in Pain? Yes    Pain Score 2     Pain Location Shoulder    Pain Orientation Right    Pain Onset 1 to 4 weeks ago                               [facility] Adult PT Treatment/Exercise - 12/27/21 0001       Shoulder Exercises: Standing   Extension Right;Theraband   2 minutes   Theraband Level (Shoulder Extension) Level 3 (Green)    Row Right;Theraband   40 reps   Theraband Level (Shoulder Row) Level 3 (Green)      Shoulder Exercises: ROM/Strengthening   UBE (Upper Arm Bike) 120 RPM x 10 minutes (forward/backward)    Other ROM/Strengthening Exercises Walk up the wall   Abduction; 2.5 minutes      Shoulder Exercises: Stretch   Corner Stretch 4 reps;30 seconds    Internal Rotation Stretch 4 reps   30 seconds each     Modalities   Modalities Vasopneumatic      Vasopneumatic   Number Minutes Vasopneumatic  15 minutes    Vasopnuematic Location  Shoulder    Vasopneumatic Pressure Low    Vasopneumatic Temperature  34                          PT Long Term Goals - 12/18/21 3500  PT LONG TERM GOAL #1   Title Patient will be independent with his HEP.    Time 7    Period Weeks    Status On-going    Target Date 01/31/22      PT LONG TERM GOAL #2   Title Patient will score a 30 or lower with his quick DASH for improved function using his right upper extremity.    Baseline 43    Time 7    Period Weeks    Status On-going    Target Date 01/31/22      PT LONG TERM GOAL #3   Title Patient will be able to demonstrate at least 130 degrees of active right shoulder flexion for improved function reaching overhead.    Time 7    Period Weeks    Status On-going    Target Date 01/31/22      PT LONG TERM GOAL #4   Title Patient will be able to demonstrate at least 130 degrees of active right shoulder abduction for improved function reaching overhead.    Time 7    Period Weeks    Status On-going    Target Date 01/31/22                   Plan - 12/27/21 8563     Clinical Impression Statement Patient was progressed with multiple new interventions with minimal difficulty. He required minimal cuing with today's interventions for improved eccentric control to facilitate muscular engagement. He reported no pain or discomfort with today's interventions. He reported that his shoulder felt good upon the conclusion of treatment. He continues to require skilled physical therapy to address his remaining impairments to return to his prior level of function.    Personal Factors and Comorbidities Profession;Comorbidity 1    Examination-Activity Limitations Reach  Overhead;Bathing;Self Feeding;Carry;Dressing;Lift    Stability/Clinical Decision Making Stable/Uncomplicated    PT Treatment/Interventions Therapeutic activities;Therapeutic exercise;Neuromuscular re-education;Manual techniques;ADLs/Self Care Home Management;Electrical Stimulation;Iontophoresis 4mg /ml Dexamethasone;Cryotherapy;Vasopneumatic Device    PT Next Visit Plan DOS: 11/22/21; see protocol    Consulted and Agree with Plan of Care Patient             Patient will benefit from skilled therapeutic intervention in order to improve the following deficits and impairments:  Decreased range of motion, Impaired UE functional use, Decreased activity tolerance, Pain, Decreased strength  Visit Diagnosis: Acute pain of right shoulder  Muscle weakness (generalized)     Problem List Patient Active Problem List   Diagnosis Date Noted   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   Obesity, morbid, BMI 40.0-49.9 ([place]) 06/05/2021   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   Gastritis and gastroduodenitis    Gastroesophageal reflux disease with esophagitis without hemorrhage 12/01/2019   Belching    Gastric polyps    Hematochezia    Irritable bowel syndrome with diarrhea    Grade II hemorrhoids    Polyp of ascending colon    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   Seasonal and perennial allergic rhinitis 07/23/2014   Abdominal mass of other site 06/15/2012   Abdominal cramps 06/15/2012   Obstructive sleep apnea 05/03/2012   Syncope and collapse 05/03/2012   Class 3 severe obesity with serious comorbidity and body mass index (BMI) of 50.0 to 59.9 in adult ([place]) 05/03/2012   Anal fissure 05/27/2011   UNSPECIFIED ANEMIA 09/11/2009   Blood in stool 04/06/2009   Lower abdominal pain 03/07/2009   BIPOLAR DISORDER UNSPECIFIED 03/06/2009   SMOKELESS TOBACCO ABUSE  03/06/2009   Attention deficit hyperactivity disorder (ADHD) 03/06/2009   GERD 03/06/2009   Diarrhea  03/06/2009    Darlin Coco, PT 12/27/2021, 10:03 AM  [facility] [place], [place], [address] Phone: [phone]   Fax:  [phone]  Name: [name] MRN: [id number] Date of Birth: 1986/04/10

## 2021-12-28 NOTE — Progress Notes (Signed)
HPI  male nonsmoker followed for OSA, allergic rhinitis, complicated by history of bipolar disorder, GERD, obesity NPSG 08/03/12-  AHI 24.1/ hr, moderate OSA.  ==================================================================================    09/04/21- Virtual Visit via Telephone Note  I connected with [name] on 09/04/21 at 10:30 AM EDT by telephone and verified that I am speaking with the correct person using two identifiers.  Location: Patient: home Provider: office   I discussed the limitations, risks, security and privacy concerns of performing an evaluation and management service by telephone and the availability of in person appointments. I also discussed with the patient that there may be a patient responsible charge related to this service. The patient expressed understanding and agreed to proceed.   History of Present Illness: 36 year old male nonsmoker followed for OSA, allergic rhinitis, complicated by history of bipolar disorder, Schizophrenia, ADHD, GERD, obesity, Allergic Rhinitis, IBS, Hypothyroid, Bariatric Surgery 2022 ([facility]), -Adderall 20     CPAP 20/Advanced Download-compliance 17%, AHI 2.8/ hr Body weight today- Covid vax-none Flu vax-none "allergic" Has lost 92 lbs since Bariatic Surgery in July. Now sleeps better w/o CPAP and suggests we re-study him.   Observations/Objective:   Assessment and Plan:   Follow Up Instructions:    I discussed the assessment and treatment plan with the patient. The patient was provided an opportunity to ask questions and all were answered. The patient agreed with the plan and demonstrated an understanding of the instructions.   The patient was advised to call back or seek an in-person evaluation if the symptoms worsen or if the condition fails to improve as anticipated.  I provided 21 minutes of non-face-to-face time during this encounter.   Baird Lyons, MD  12/31/21- 36 year old male nonsmoker followed for  OSA, allergic rhinitis, complicated by history of bipolar disorder, Schizophrenia, ADHD, GERD, obesity, Allergic Rhinitis, IBS, Hypothyroid, Bariatric Surgery 2022 ([facility]), NPSG 10/07/21- AHI 10.7/ hr, desaturation to 60%, body weight 320 lbs -Vyvanse 20(Dr Gottschalk)    CPAP 20/[employer] Download-compliance unable to use until we adjust pressure after bariatric sgy Body weight today-305 lbs Covid vax-none Flu vax-none We reviewed most recent sleep study.  He is comfortable continuing CPAP but says his current pressure setting at 20 is too high after bariatric surgery.  Needs to maintain commercial driver's license.  ROS-see HPI   + = positive Constitutional:   + weight loss, night sweats, fevers, chills, fatigue, lassitude. HEENT:   No-  headaches, difficulty swallowing, tooth/dental problems, sore throat,       No-  sneezing, itching, ear ache, +nasal congestion, post nasal drip,  CV:  No-  cardiac chest pain, orthopnea, PND, swelling in lower extremities, anasarca,  dizziness, palpitations Resp: + shortness of breath with exertion or at rest.              No-   productive cough,  No non-productive cough,  No- coughing up of blood.              No-   change in color of mucus.  No- wheezing.   Skin: No-   rash or lesions. GI:  + heartburn, indigestion, no-abdominal pain, nausea, vomiting,  GU:  MS:  No-   joint pain or swelling. + Back pain Neuro-     nothing unusual Psych:   See HPI-No- change in mood or affect. + depression or anxiety.  No memory loss.  OBJ- Physical Exam General- Alert, Oriented, Affect-appropriate, Distress- none acute, + still obese Skin- rash-none, lesions- none, excoriation- none Lymphadenopathy- none  Head- atraumatic            Eyes- Gross vision intact, PERRLA, conjunctivae and secretions clear            Ears- Hearing, canals-normal            Nose- Clear, no-Septal dev, mucus, polyps, erosion, perforation             Throat- Mallampati III , mucosa-brown  stained, not dry , drainage- none, tonsils- 2+ Neck- flexible , trachea midline, no stridor , thyroid nl, carotid no bruit Chest - symmetrical excursion , unlabored           Heart/CV- RRR , no murmur , no gallop  , no rub, nl s1 s2                           - JVD- none , edema- none, stasis changes- none, varices- none           Lung- clear to P&A, wheeze- none, cough- none , dullness-none, rub- none           Chest wall-  Abd-  Br/ Gen/ Rectal- Not done, not indicated Extrem- cyanosis- none, clubbing, none, atrophy- none, strength- nl Neuro- grossly intact to observation

## 2021-12-31 ENCOUNTER — Encounter: Payer: Self-pay | Admitting: Internal Medicine

## 2021-12-31 ENCOUNTER — Ambulatory Visit (INDEPENDENT_AMBULATORY_CARE_PROVIDER_SITE_OTHER): Payer: Medicaid Other | Admitting: Internal Medicine

## 2021-12-31 ENCOUNTER — Other Ambulatory Visit: Payer: Self-pay

## 2021-12-31 ENCOUNTER — Ambulatory Visit: Payer: Medicaid Other

## 2021-12-31 VITALS — BP 126/70 | HR 104 | Temp 98.5°F | Ht 70.0 in | Wt 305.0 lb

## 2021-12-31 DIAGNOSIS — M6281 Muscle weakness (generalized): Secondary | ICD-10-CM | POA: Diagnosis not present

## 2021-12-31 DIAGNOSIS — M25511 Pain in right shoulder: Secondary | ICD-10-CM

## 2021-12-31 DIAGNOSIS — G4733 Obstructive sleep apnea (adult) (pediatric): Secondary | ICD-10-CM | POA: Diagnosis not present

## 2021-12-31 NOTE — Therapy (Signed)
[facility] [place], [place], [address] Phone: [phone]   Fax:  [phone]  Physical Therapy Treatment  Patient Details  Name: [name] MRN: [id number] Date of Birth: 29-[name]-1986 Referring Provider (PT): [name], MD   Encounter Date: 12/31/2021   PT End of Session - 12/31/21 0820     Visit Number 6    Number of Visits 14    Date for PT Re-Evaluation 02/22/22    Authorization - Number of Visits 16    PT Start Time 0815    PT Stop Time 0916    PT Time Calculation (min) 61 min    Activity Tolerance Patient tolerated treatment well    Behavior During Therapy [facility] for tasks assessed/performed             Past Medical History:  Diagnosis Date   Abscess    Right forearm healing   ADHD (attention deficit hyperactivity disorder)    Anal fissure    Anxiety    Arthritis    back, shoulders, ankles, knees   Back pain    Bipolar disorder (HCC)    Chest pain    Chronic lower back pain    Constipation    Depression    Esophagitis    Distal esophageal erosions consistent with mild erosive  reflux esophagitis 12/2008 by EGD    External hemorrhoids    Gastric polyps    Gastric ulcer    GERD (gastroesophageal reflux disease)    Hepatic steatosis    Hiatal hernia    History of stomach ulcers    Hx MRSA infection 06/2007   right thigh   Hx of colonic polyps    Hypercholesteremia    denies   Hypothyroidism    Internal hemorrhoids    Irritable bowel syndrome (IBS)    Obesity    Occult GI bleeding 12/2008   Trivial upper GI bleed/uncontrolled GERD by upper endoscopy 12/2008, normal f/u endoscopy 03/2009 with SBCE at that time   OSA on CPAP    Pain management    Pneumonia 09/01/2015   "on ATB still" (09/04/2015)   S/P colonoscopy 11/10, 10/08   Dr [name]   Schizophrenia [facility])    Sleep apnea    Stomach ulcer    Thyroid function test abnormal    Noted in 2011 discharge   Tobacco dipper    Wears  contact lenses     Past Surgical History:  Procedure Laterality Date   ANKLE FRACTURE SURGERY Left ~ 2008   BACK SURGERY     BIOPSY  11/02/2019   Procedure: BIOPSY;  Surgeon: Lavena Bullion, DO;  Location: [facility];  Service: Gastroenterology;;  EGD and Colon   BIOPSY  06/07/2020   Procedure: BIOPSY;  Surgeon: Lavena Bullion, DO;  Location: [facility];  Service: Gastroenterology;;   [place] STUDY N/A 06/07/2020   Procedure: BRAVO [place] STUDY on PPI;  Surgeon: Lavena Bullion, DO;  Location: [facility];  Service: Gastroenterology;  Laterality: N/A;   COLONOSCOPY  10/10/2009   anal papilla otherwise normal   COLONOSCOPY WITH PROPOFOL N/A 11/02/2019   Procedure: COLONOSCOPY WITH PROPOFOL;  Surgeon: Lavena Bullion, DO;  Location: [facility];  Service: Gastroenterology;  Laterality: N/A;   ESOPHAGOGASTRODUODENOSCOPY  04/07/2009   Normal esophagus, small hiatal hernia   ESOPHAGOGASTRODUODENOSCOPY  01/09/2009   Distal esophageal erosions consistent with mild erosive reflux esophagitis, otherwise normal esophagus, small hiatal herniaotherwise normal stomach, D1-D2    ESOPHAGOGASTRODUODENOSCOPY  08/26/2007  Normal esophagus, a small hiatal/hernia, otherwise normal stomach D1 through D3   ESOPHAGOGASTRODUODENOSCOPY (EGD) WITH PROPOFOL N/A 11/02/2019   Procedure: ESOPHAGOGASTRODUODENOSCOPY (EGD) WITH PROPOFOL;  Surgeon: Lavena Bullion, DO;  [facility]: [facility];  Service: Gastroenterology;  Laterality: N/A;   ESOPHAGOGASTRODUODENOSCOPY (EGD) WITH PROPOFOL N/A 06/07/2020   Procedure: ESOPHAGOGASTRODUODENOSCOPY (EGD) WITH PROPOFOL;  Surgeon: Lavena Bullion, DO;  [facility]: [facility];  Service: Gastroenterology;  Laterality: N/A;   FRACTURE SURGERY     GASTRIC BYPASS     GASTRIC ROUX-EN-Y N/A 06/05/2021   Procedure: LAPAROSCOPIC ROUX-EN-Y GASTRIC BYPASS WITH UPPER ENDOSCOPY;  Surgeon: Clovis Riley, MD;  Location: [facility];  Service: General;  Laterality: N/A;    ileocolonoscopy  08/26/2007    A normal rectum, colon, and terminal ileum   INCISION AND DRAINAGE  09/04/2015   "reopened my back incsion"   LUMBAR LAMINECTOMY/DECOMPRESSION MICRODISCECTOMY Left 09/23/2017   Procedure: Microdiscectomy - left - Lumbar four-Lumbar five;  Surgeon: Earnie Larsson, MD;  Location: [place];  Service: Neurosurgery;  Laterality: Left;   LUMBAR MICRODISCECTOMY  08/21/2015   LUMBAR MICRODISCECTOMY Left 09/23/2017   L4-5   LUMBAR WOUND DEBRIDEMENT N/A 09/04/2015   Procedure: LUMBAR WOUND DEBRIDEMENT;  Surgeon: Consuella Lose, MD;  Location: [place] [facility];  Service: Neurosurgery;  Laterality: N/A;   right side sugery     enlarged lymph node gland removed under arm pit age 89-5 years   SHOULDER ARTHROSCOPY WITH DISTAL CLAVICLE RESECTION Right 11/22/2021   Procedure: SHOULDER ARTHROSCOPY WITH SUBACROMIAL DECOMPRESSION AND DISTAL CLAVICLE EXCISION;  Surgeon: Mordecai Rasmussen, MD;  Location: [facility];  Service: Orthopedics;  Laterality: Right;   Small bowel capsule  04/11/2009    normal throughout   VASECTOMY      There were no vitals filed for this visit.   Subjective Assessment - 12/31/21 0819     Subjective Patient reports that his shoulder feels good today and he has not had any problems since his last appointment. He feels that his shoulder is about 75% of his prior level of function. He still feels that he needs to build up his strength.    Limitations Lifting    Patient Stated Goals return to work, raise his arm    Currently in Pain? No/denies    Pain Onset 1 to 4 weeks ago                [facility] PT Assessment - 12/31/21 0001       AROM   Right Shoulder Flexion 160 Degrees    Right Shoulder ABduction 144 Degrees    Right Shoulder Internal Rotation --   to T9   Right Shoulder External Rotation --   to T2                          [facility] Adult PT Treatment/Exercise - 12/31/21 0001       Shoulder Exercises: Sidelying   External Rotation  Right;15 reps;Weights   2 sets   External Rotation Weight (lbs) 1      Shoulder Exercises: Standing   External Rotation AAROM;20 reps;Right   5 second hold   Internal Rotation Right;Theraband   40 reps   Theraband Level (Shoulder Internal Rotation) Level 3 (Green)    Flexion Right;10 reps;Weights   2 sets   Shoulder Flexion Weight (lbs) 2    Extension Right;Theraband   40 resp   Theraband Level (Shoulder Extension) Level 3 (Green)    Row  Right;15 reps   2 sets   Row Weight (lbs) Blue XTS    Diagonals Right;20 reps    Diagonals Weight (lbs) Blue XTS      Shoulder Exercises: ROM/Strengthening   UBE (Upper Arm Bike) 120 RPM x 10 minutes (forward/backward)      Shoulder Exercises: Stretch   Wall Stretch - Flexion 4 reps;30 seconds      Modalities   Modalities Vasopneumatic      Vasopneumatic   Number Minutes Vasopneumatic  15 minutes    Vasopnuematic Location  Shoulder    Vasopneumatic Pressure Low    Vasopneumatic Temperature  34                          PT Long Term Goals - 12/18/21 0854       PT LONG TERM GOAL #1   Title Patient will be independent with his HEP.    Time 7    Period Weeks    Status On-going    Target Date 01/31/22      PT LONG TERM GOAL #2   Title Patient will score a 30 or lower with his quick DASH for improved function using his right upper extremity.    Baseline 43    Time 7    Period Weeks    Status On-going    Target Date 01/31/22      PT LONG TERM GOAL #3   Title Patient will be able to demonstrate at least 130 degrees of active right shoulder flexion for improved function reaching overhead.    Time 7    Period Weeks    Status On-going    Target Date 01/31/22      PT LONG TERM GOAL #4   Title Patient will be able to demonstrate at least 130 degrees of active right shoulder abduction for improved function reaching overhead.    Time 7    Period Weeks    Status On-going    Target Date 01/31/22                    Plan - 12/31/21 0820     Clinical Impression Statement Patient is making good progress with skilled physical therapy as evidenced by his subjecitve reports, objective measures, and functional mobility. He was able to demonstrate a significant improvement in his AROM compared to his initial evaluation. Right shoulder strength is the most limiting factor with external rotation being the most limited motion. He required minimal cuing with sidelying external rotation for slow eccentric control to facilitate infraspinatus engagement. He reported that his shoulder felt good upon the conclusion of treatment. He continues to require skilled physical therapy to address his remaining impairments to return to his prior level of function.    Personal Factors and Comorbidities Profession;Comorbidity 1    Examination-Activity Limitations Reach Overhead;Bathing;Self Feeding;Carry;Dressing;Lift    Stability/Clinical Decision Making Stable/Uncomplicated    PT Treatment/Interventions Therapeutic activities;Therapeutic exercise;Neuromuscular re-education;Manual techniques;ADLs/Self Care Home Management;Electrical Stimulation;Iontophoresis 4mg /ml Dexamethasone;Cryotherapy;Vasopneumatic Device    PT Next Visit Plan DOS: 11/22/21; see protocol    Consulted and Agree with Plan of Care Patient             Patient will benefit from skilled therapeutic intervention in order to improve the following deficits and impairments:  Decreased range of motion, Impaired UE functional use, Decreased activity tolerance, Pain, Decreased strength  Visit Diagnosis: Acute pain of right shoulder  Muscle weakness (generalized)  Problem List Patient Active Problem List   Diagnosis Date Noted   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   Obesity, morbid, BMI 40.0-49.9 ([place]) 06/05/2021   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   Gastritis and gastroduodenitis    Gastroesophageal  reflux disease with esophagitis without hemorrhage 12/01/2019   Belching    Gastric polyps    Hematochezia    Irritable bowel syndrome with diarrhea    Grade II hemorrhoids    Polyp of ascending colon    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   Seasonal and perennial allergic rhinitis 07/23/2014   Abdominal mass of other site 06/15/2012   Abdominal cramps 06/15/2012   Obstructive sleep apnea 05/03/2012   Syncope and collapse 05/03/2012   Class 3 severe obesity with serious comorbidity and body mass index (BMI) of 50.0 to 59.9 in adult ([place]) 05/03/2012   Anal fissure 05/27/2011   UNSPECIFIED ANEMIA 09/11/2009   Blood in stool 04/06/2009   Lower abdominal pain 03/07/2009   BIPOLAR DISORDER UNSPECIFIED 03/06/2009   SMOKELESS TOBACCO ABUSE 03/06/2009   Attention deficit hyperactivity disorder (ADHD) 03/06/2009   GERD 03/06/2009   Diarrhea 03/06/2009    Darlin Coco, PT 12/31/2021, 9:20 AM  [facility] [address] [place], [place], [address] Phone: [phone]   Fax:  [phone]  Name: [name] MRN: [id number] Date of Birth: 1986-08-10

## 2021-12-31 NOTE — Patient Instructions (Signed)
Order- DME [employer]- please change auto range to 4-10, continue mask of choice, humidifier, supplies. Verify [employer] and/ or card  Please call if we can help

## 2022-01-01 ENCOUNTER — Ambulatory Visit (INDEPENDENT_AMBULATORY_CARE_PROVIDER_SITE_OTHER): Payer: Medicaid Other | Admitting: Orthopedic Surgery

## 2022-01-01 ENCOUNTER — Encounter: Payer: Self-pay | Admitting: Orthopedic Surgery

## 2022-01-01 VITALS — Ht 70.0 in | Wt 308.0 lb

## 2022-01-01 DIAGNOSIS — M25511 Pain in right shoulder: Secondary | ICD-10-CM

## 2022-01-01 DIAGNOSIS — Z09 Encounter for follow-up examination after completed treatment for conditions other than malignant neoplasm: Secondary | ICD-10-CM

## 2022-01-01 NOTE — Progress Notes (Signed)
Orthopaedic Postop Note  Assessment: [name] is a 36 y.o. male s/p right shoulder arthroscopy, subacromial decompression and distal clavicle excision  DOS: 11/22/2021  Plan: Patient is doing very well.  He is not taking any pain medications.  He is using the [employer] ice machine.  He continues to work well with physical therapy.  He has full range of motion, but is lacking some strength.  He does not feel comfortable returning to work yet.  He does feel as though he could return within the next month or so.  As result, we will see him back in approximately 3 weeks, to assess his strength and potentially allow him to return to full duties at work.   Follow-up: Return in about 3 weeks (around 01/22/2022). XR at next visit: None  Subjective:  Chief Complaint  Patient presents with   Routine Post Op    Rt Shoulder DOS 11/22/21    History of Present Illness: [name] is a 36 y.o. male who presents following the above stated procedure.  Surgery was approximately 6 weeks ago.  He has no pain in his right shoulder, except for some aching after exercise.  He is not taking any medications.  His range of motion is almost completely full.  He does note some weakness in his right shoulder.  No numbness or tingling.  No issues with the surgical incisions.  He is pleased with his progress and improvements today.    Review of Systems: No fevers or chills No numbness or tingling No Chest Pain No shortness of breath   Objective: Ht 5\' 10"  (1.778 m)    Wt (!) 308 lb (139.7 kg)    BMI 44.19 kg/m   Physical Exam:  Alert and oriented.  No acute distress.  Evaluation of the right shoulder demonstrates well-healing surgical incisions.  No surrounding erythema or drainage.  He has no bruising or swelling.  160 degrees of forward flexion.  100 degrees of abduction.  Internal rotation to his lumbar spine.  Supraspinatus, infraspinatus and subscapularis strength are all 4/5.  4/5  deltoid strength.  Fingers are warm and well-perfused.  Sensation is intact throughout the right hand.  IMAGING: I personally ordered and reviewed the following images:  No new imaging obtained today.   Mordecai Rasmussen, MD 01/01/2022 9:07 AM

## 2022-01-07 ENCOUNTER — Other Ambulatory Visit: Payer: Self-pay

## 2022-01-07 ENCOUNTER — Ambulatory Visit: Payer: Medicaid Other | Admitting: Physical Therapy

## 2022-01-07 ENCOUNTER — Encounter: Payer: Self-pay | Admitting: Physical Therapy

## 2022-01-07 DIAGNOSIS — M6281 Muscle weakness (generalized): Secondary | ICD-10-CM

## 2022-01-07 DIAGNOSIS — M25511 Pain in right shoulder: Secondary | ICD-10-CM

## 2022-01-07 NOTE — Assessment & Plan Note (Signed)
We reviewed his sleep study and the importance of weight loss following his bariatric surgery. Plan-continue CPAP but change pressure range to 4-10

## 2022-01-07 NOTE — Therapy (Signed)
[facility] [place], [place], [address] Phone: [phone]   Fax:  [phone]  Physical Therapy Treatment  Patient Details  Name: [name] MRN: [id number] Date of Birth: [birth year] Referring Provider (PT): [name], MD   Encounter Date: 01/07/2022   PT End of Session - 01/07/22 0818     Visit Number 7    Number of Visits 14    Date for PT Re-Evaluation 02/22/22    Authorization - Number of Visits 16    PT Start Time 0817    PT Stop Time 0856    PT Time Calculation (min) 39 min    Activity Tolerance Patient tolerated treatment well    Behavior During Therapy [facility] for tasks assessed/performed             Past Medical History:  Diagnosis Date   Abscess    Right forearm healing   ADHD (attention deficit hyperactivity disorder)    Anal fissure    Anxiety    Arthritis    back, shoulders, ankles, knees   Back pain    Bipolar disorder (HCC)    Chest pain    Chronic lower back pain    Constipation    Depression    Esophagitis    Distal esophageal erosions consistent with mild erosive  reflux esophagitis 12/2008 by EGD    External hemorrhoids    Gastric polyps    Gastric ulcer    GERD (gastroesophageal reflux disease)    Hepatic steatosis    Hiatal hernia    History of stomach ulcers    Hx MRSA infection 06/2007   right thigh   Hx of colonic polyps    Hypercholesteremia    denies   Hypothyroidism    Internal hemorrhoids    Irritable bowel syndrome (IBS)    Obesity    Occult GI bleeding 12/2008   Trivial upper GI bleed/uncontrolled GERD by upper endoscopy 12/2008, normal f/u endoscopy 03/2009 with SBCE at that time   OSA on CPAP    Pain management    Pneumonia 09/01/2015   "on ATB still" (09/04/2015)   S/P colonoscopy 11/10, 10/08   Dr [name]   Schizophrenia [facility])    Sleep apnea    Stomach ulcer    Thyroid function test abnormal    Noted in 2011 discharge   Tobacco dipper    Wears  contact lenses     Past Surgical History:  Procedure Laterality Date   ANKLE FRACTURE SURGERY Left ~ 2008   BACK SURGERY     BIOPSY  11/02/2019   Procedure: BIOPSY;  Surgeon: Lavena Bullion, DO;  [facility];  Service: Gastroenterology;;  EGD and Colon   BIOPSY  06/07/2020   Procedure: BIOPSY;  Surgeon: Lavena Bullion, DO;  [facility];  Service: Gastroenterology;;   Franki Monte [place] STUDY N/A 06/07/2020   Procedure: BRAVO [place] STUDY on PPI;  Surgeon: Lavena Bullion, DO;  [facility];  Service: Gastroenterology;  Laterality: N/A;   COLONOSCOPY  10/10/2009   anal papilla otherwise normal   COLONOSCOPY WITH PROPOFOL N/A 11/02/2019   Procedure: COLONOSCOPY WITH PROPOFOL;  Surgeon: Lavena Bullion, DO;  [facility];  Service: Gastroenterology;  Laterality: N/A;   ESOPHAGOGASTRODUODENOSCOPY  04/07/2009   Normal esophagus, small hiatal hernia   ESOPHAGOGASTRODUODENOSCOPY  01/09/2009   Distal esophageal erosions consistent with mild erosive reflux esophagitis, otherwise normal esophagus, small hiatal herniaotherwise normal stomach, D1-D2    ESOPHAGOGASTRODUODENOSCOPY  08/26/2007  Normal esophagus, a small hiatal/hernia, otherwise normal stomach D1 through D3   ESOPHAGOGASTRODUODENOSCOPY (EGD) WITH PROPOFOL N/A 11/02/2019   Procedure: ESOPHAGOGASTRODUODENOSCOPY (EGD) WITH PROPOFOL;  Surgeon: Lavena Bullion, DO;  [facility];  Service: Gastroenterology;  Laterality: N/A;   ESOPHAGOGASTRODUODENOSCOPY (EGD) WITH PROPOFOL N/A 06/07/2020   Procedure: ESOPHAGOGASTRODUODENOSCOPY (EGD) WITH PROPOFOL;  Surgeon: Lavena Bullion, DO;  [facility];  Service: Gastroenterology;  Laterality: N/A;   FRACTURE SURGERY     GASTRIC BYPASS     GASTRIC ROUX-EN-Y N/A 06/05/2021   Procedure: LAPAROSCOPIC ROUX-EN-Y GASTRIC BYPASS WITH UPPER ENDOSCOPY;  Surgeon: Clovis Riley, MD;  [facility];  Service: General;  Laterality: N/A;    ileocolonoscopy  08/26/2007    A normal rectum, colon, and terminal ileum   INCISION AND DRAINAGE  09/04/2015   "reopened my back incsion"   LUMBAR LAMINECTOMY/DECOMPRESSION MICRODISCECTOMY Left 09/23/2017   Procedure: Microdiscectomy - left - Lumbar four-Lumbar five;  Surgeon: Earnie Larsson, MD;  Location: [place];  Service: Neurosurgery;  Laterality: Left;   LUMBAR MICRODISCECTOMY  08/21/2015   LUMBAR MICRODISCECTOMY Left 09/23/2017   L4-5   LUMBAR WOUND DEBRIDEMENT N/A 09/04/2015   Procedure: LUMBAR WOUND DEBRIDEMENT;  Surgeon: Consuella Lose, MD;  Location: [place] [facility];  Service: Neurosurgery;  Laterality: N/A;   right side sugery     enlarged lymph node gland removed under arm pit age 11-5 years   SHOULDER ARTHROSCOPY WITH DISTAL CLAVICLE RESECTION Right 11/22/2021   Procedure: SHOULDER ARTHROSCOPY WITH SUBACROMIAL DECOMPRESSION AND DISTAL CLAVICLE EXCISION;  Surgeon: Mordecai Rasmussen, MD;  [facility];  Service: Orthopedics;  Laterality: Right;   Small bowel capsule  04/11/2009    normal throughout   VASECTOMY      There were no vitals filed for this visit.   Subjective Assessment - 01/07/22 0817     Subjective No new complaints.    Limitations Lifting    Patient Stated Goals return to work, raise his arm    Currently in Pain? No/denies                [facility] PT Assessment - 01/07/22 0001       Assessment   Medical Diagnosis Arthralgia of right AC joint    Referring Provider (PT) [name], MD    Onset Date/Surgical Date 11/22/21    Hand Dominance Right    Prior Therapy No      Precautions   Precautions Shoulder    Precaution Comments No reaching overhead, no lifting                           [facility] Adult PT Treatment/Exercise - 01/07/22 0001       Shoulder Exercises: Standing   Protraction Strengthening;Right;20 reps;Theraband    Theraband Level (Shoulder Protraction) Level 3 (Green)    External Rotation Strengthening;Right;20  reps;Theraband    Theraband Level (Shoulder External Rotation) Level 2 (Red)    Internal Rotation Strengthening;Right;20 reps;Theraband    Theraband Level (Shoulder Internal Rotation) Level 3 (Green)    Flexion Strengthening;Right;20 reps;Weights    Shoulder Flexion Weight (lbs) 2    ABduction Strengthening;Right;20 reps;Weights    Shoulder ABduction Weight (lbs) 2    Extension Strengthening;Right;20 reps;Theraband    Theraband Level (Shoulder Extension) Level 3 (Green)    Extension Limitations BUE shoulder extension blue XTS x30 reps    Row Strengthening;Right;20 reps;Theraband    Theraband Level (Shoulder Row) Level 3 (Green)  Diagonals AROM;Right;20 reps    Other Standing Exercises R shoulder scaption 2# x20 reps      Shoulder Exercises: Pulleys   Flexion 3 minutes      Shoulder Exercises: ROM/Strengthening   UBE (Upper Arm Bike) 60 RPM x8 min    Wall Pushups 20 reps      Modalities   Modalities Vasopneumatic      Vasopneumatic   Number Minutes Vasopneumatic  10 minutes    Vasopnuematic Location  Shoulder    Vasopneumatic Pressure Low    Vasopneumatic Temperature  34                          PT Long Term Goals - 12/18/21 0854       PT LONG TERM GOAL #1   Title Patient will be independent with his HEP.    Time 7    Period Weeks    Status On-going    Target Date 01/31/22      PT LONG TERM GOAL #2   Title Patient will score a 30 or lower with his quick DASH for improved function using his right upper extremity.    Baseline 43    Time 7    Period Weeks    Status On-going    Target Date 01/31/22      PT LONG TERM GOAL #3   Title Patient will be able to demonstrate at least 130 degrees of active right shoulder flexion for improved function reaching overhead.    Time 7    Period Weeks    Status On-going    Target Date 01/31/22      PT LONG TERM GOAL #4   Title Patient will be able to demonstrate at least 130 degrees of active right shoulder  abduction for improved function reaching overhead.    Time 7    Period Weeks    Status On-going    Target Date 01/31/22                   Plan - 01/07/22 0851     Clinical Impression Statement Patient presented in clinic with reports of no complaints other than building strength. Patient is to return to work he is hoping by 01/23/2022 and wants to increase functional strength in that time. Patient progressed through strengthening protocol today with monitoring of symptoms but no complaints. Normal vasopneumatic response noted following removal of the modality.    Personal Factors and Comorbidities Profession;Comorbidity 1    Comorbidities OA    Examination-Activity Limitations Reach Overhead;Bathing;Self Feeding;Carry;Dressing;Lift    Examination-Participation Restrictions Occupation;Community Activity;Driving;Volunteer;Yard Work    Stability/Clinical Decision Making Stable/Uncomplicated    Rehab Potential Excellent    PT Frequency 2x / week    PT Duration Other (comment)    PT Treatment/Interventions Therapeutic activities;Therapeutic exercise;Neuromuscular re-education;Manual techniques;ADLs/Self Care Home Management;Electrical Stimulation;Iontophoresis 4mg /ml Dexamethasone;Cryotherapy;Vasopneumatic Device    PT Next Visit Plan DOS: 11/22/21; see protocol    Consulted and Agree with Plan of Care Patient             Patient will benefit from skilled therapeutic intervention in order to improve the following deficits and impairments:  Decreased range of motion, Impaired UE functional use, Decreased activity tolerance, Pain, Decreased strength  Visit Diagnosis: Acute pain of right shoulder  Muscle weakness (generalized)     Problem List Patient Active Problem List   Diagnosis Date Noted   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   Obesity, morbid, BMI  40.0-49.9 ([place]) 06/05/2021   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   Gastritis and  gastroduodenitis    Gastroesophageal reflux disease with esophagitis without hemorrhage 12/01/2019   Belching    Gastric polyps    Hematochezia    Irritable bowel syndrome with diarrhea    Grade II hemorrhoids    Polyp of ascending colon    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   Seasonal and perennial allergic rhinitis 07/23/2014   Abdominal mass of other site 06/15/2012   Abdominal cramps 06/15/2012   Obstructive sleep apnea 05/03/2012   Syncope and collapse 05/03/2012   Class 3 severe obesity with serious comorbidity and body mass index (BMI) of 50.0 to 59.9 in adult ([place]) 05/03/2012   Anal fissure 05/27/2011   UNSPECIFIED ANEMIA 09/11/2009   Blood in stool 04/06/2009   Lower abdominal pain 03/07/2009   BIPOLAR DISORDER UNSPECIFIED 03/06/2009   SMOKELESS TOBACCO ABUSE 03/06/2009   Attention deficit hyperactivity disorder (ADHD) 03/06/2009   GERD 03/06/2009   Diarrhea 03/06/2009    Standley Brooking, PTA 01/07/2022, 9:01 AM  [facility] [address] [place], [place], [address] Phone: [phone]   Fax:  [phone]  Name: SHEPPARD LUCKENBACH MRN: [id number] Date of Birth: May 21, 1986

## 2022-01-07 NOTE — Assessment & Plan Note (Signed)
Further weight loss is important and encouraged.

## 2022-01-10 ENCOUNTER — Other Ambulatory Visit: Payer: Self-pay

## 2022-01-10 ENCOUNTER — Ambulatory Visit: Payer: Medicaid Other | Admitting: *Deleted

## 2022-01-10 DIAGNOSIS — M6281 Muscle weakness (generalized): Secondary | ICD-10-CM

## 2022-01-10 DIAGNOSIS — M25511 Pain in right shoulder: Secondary | ICD-10-CM

## 2022-01-10 NOTE — Therapy (Signed)
[facility] [place], [place], [address] Phone: [phone]   Fax:  [phone]  Physical Therapy Treatment  Patient Details  Name: [name] MRN: [id number] Date of Birth: [birth year] Referring Provider (PT): [name], MD   Encounter Date: 01/10/2022   PT End of Session - 01/10/22 0825     Visit Number 8    Number of Visits 14    Date for PT Re-Evaluation 02/22/22    Authorization - Number of Visits 16    PT Start Time 0815    PT Stop Time 0900    PT Time Calculation (min) 45 min             Past Medical History:  Diagnosis Date   Abscess    Right forearm healing   ADHD (attention deficit hyperactivity disorder)    Anal fissure    Anxiety    Arthritis    back, shoulders, ankles, knees   Back pain    Bipolar disorder (HCC)    Chest pain    Chronic lower back pain    Constipation    Depression    Esophagitis    Distal esophageal erosions consistent with mild erosive  reflux esophagitis 12/2008 by EGD    External hemorrhoids    Gastric polyps    Gastric ulcer    GERD (gastroesophageal reflux disease)    Hepatic steatosis    Hiatal hernia    History of stomach ulcers    Hx MRSA infection 06/2007   right thigh   Hx of colonic polyps    Hypercholesteremia    denies   Hypothyroidism    Internal hemorrhoids    Irritable bowel syndrome (IBS)    Obesity    Occult GI bleeding 12/2008   Trivial upper GI bleed/uncontrolled GERD by upper endoscopy 12/2008, normal f/u endoscopy 03/2009 with SBCE at that time   OSA on CPAP    Pain management    Pneumonia 09/01/2015   "on ATB still" (09/04/2015)   S/P colonoscopy 11/10, 10/08   Dr [name]   Schizophrenia [facility])    Sleep apnea    Stomach ulcer    Thyroid function test abnormal    Noted in 2011 discharge   Tobacco dipper    Wears contact lenses     Past Surgical History:  Procedure Laterality Date   ANKLE FRACTURE SURGERY Left ~ 2008   BACK  SURGERY     BIOPSY  11/02/2019   Procedure: BIOPSY;  Surgeon: Lavena Bullion, DO;  [facility];  Service: Gastroenterology;;  EGD and Colon   BIOPSY  06/07/2020   Procedure: BIOPSY;  Surgeon: Lavena Bullion, DO;  [facility];  Service: Gastroenterology;;   Franki Monte [place] STUDY N/A 06/07/2020   Procedure: BRAVO [place] STUDY on PPI;  Surgeon: Lavena Bullion, DO;  [facility];  Service: Gastroenterology;  Laterality: N/A;   COLONOSCOPY  10/10/2009   anal papilla otherwise normal   COLONOSCOPY WITH PROPOFOL N/A 11/02/2019   Procedure: COLONOSCOPY WITH PROPOFOL;  Surgeon: Lavena Bullion, DO;  [facility];  Service: Gastroenterology;  Laterality: N/A;   ESOPHAGOGASTRODUODENOSCOPY  04/07/2009   Normal esophagus, small hiatal hernia   ESOPHAGOGASTRODUODENOSCOPY  01/09/2009   Distal esophageal erosions consistent with mild erosive reflux esophagitis, otherwise normal esophagus, small hiatal herniaotherwise normal stomach, D1-D2    ESOPHAGOGASTRODUODENOSCOPY  08/26/2007   Normal esophagus, a small hiatal/hernia, otherwise normal stomach D1 through D3   ESOPHAGOGASTRODUODENOSCOPY (EGD) WITH PROPOFOL  N/A 11/02/2019   Procedure: ESOPHAGOGASTRODUODENOSCOPY (EGD) WITH PROPOFOL;  Surgeon: Lavena Bullion, DO;  [facility];  Service: Gastroenterology;  Laterality: N/A;   ESOPHAGOGASTRODUODENOSCOPY (EGD) WITH PROPOFOL N/A 06/07/2020   Procedure: ESOPHAGOGASTRODUODENOSCOPY (EGD) WITH PROPOFOL;  Surgeon: Lavena Bullion, DO;  [facility];  Service: Gastroenterology;  Laterality: N/A;   FRACTURE SURGERY     GASTRIC BYPASS     GASTRIC ROUX-EN-Y N/A 06/05/2021   Procedure: LAPAROSCOPIC ROUX-EN-Y GASTRIC BYPASS WITH UPPER ENDOSCOPY;  Surgeon: Clovis Riley, MD;  [facility];  Service: General;  Laterality: N/A;   ileocolonoscopy  08/26/2007    A normal rectum, colon, and terminal ileum   INCISION AND DRAINAGE  09/04/2015    "reopened my back incsion"   LUMBAR LAMINECTOMY/DECOMPRESSION MICRODISCECTOMY Left 09/23/2017   Procedure: Microdiscectomy - left - Lumbar four-Lumbar five;  Surgeon: Earnie Larsson, MD;  Location: [place];  Service: Neurosurgery;  Laterality: Left;   LUMBAR MICRODISCECTOMY  08/21/2015   LUMBAR MICRODISCECTOMY Left 09/23/2017   L4-5   LUMBAR WOUND DEBRIDEMENT N/A 09/04/2015   Procedure: LUMBAR WOUND DEBRIDEMENT;  Surgeon: Consuella Lose, MD;  Location: [place] [facility];  Service: Neurosurgery;  Laterality: N/A;   right side sugery     enlarged lymph node gland removed under arm pit age 6-5 years   SHOULDER ARTHROSCOPY WITH DISTAL CLAVICLE RESECTION Right 11/22/2021   Procedure: SHOULDER ARTHROSCOPY WITH SUBACROMIAL DECOMPRESSION AND DISTAL CLAVICLE EXCISION;  Surgeon: Mordecai Rasmussen, MD;  [facility];  Service: Orthopedics;  Laterality: Right;   Small bowel capsule  04/11/2009    normal throughout   VASECTOMY      There were no vitals filed for this visit.   Subjective Assessment - 01/10/22 0824     Subjective No new complaints. RT shldr 2-3/10    pain    Limitations Lifting    Patient Stated Goals return to work, raise his arm    Currently in Pain? Yes    Pain Score 2     Pain Orientation Right    Pain Descriptors / Indicators Discomfort                               [facility] Adult PT Treatment/Exercise - 01/10/22 0001       Shoulder Exercises: Standing   Protraction Strengthening;Right;20 reps;Theraband    Theraband Level (Shoulder Protraction) Level 3 (Green)    External Rotation Strengthening;Right;20 reps;Theraband    Theraband Level (Shoulder External Rotation) Level 2 (Red)    Internal Rotation Strengthening    Theraband Level (Shoulder Internal Rotation) Level 3 (Green)    Flexion Strengthening;Right;20 reps;Weights    Shoulder Flexion Weight (lbs) 2    ABduction Strengthening;Right;20 reps;Weights    Shoulder ABduction Weight (lbs) 2    Extension  Strengthening;Right;20 reps;Theraband    Theraband Level (Shoulder Extension) Level 3 (Green)    Row Strengthening;Right;20 reps;Theraband    Theraband Level (Shoulder Row) Level 3 (Green)    Other Standing Exercises 7# x20 raise with Both UEs      Shoulder Exercises: Pulleys   Flexion 3 minutes      Shoulder Exercises: ROM/Strengthening   UBE (Upper Arm Bike) 60 RPM x 10 min    Wall Pushups 20 reps      Modalities   Modalities Vasopneumatic      Vasopneumatic   Number Minutes Vasopneumatic  10 minutes    Vasopnuematic Location  Shoulder  Vasopneumatic Pressure Low    Vasopneumatic Temperature  34                          PT Long Term Goals - 12/18/21 0854       PT LONG TERM GOAL #1   Title Patient will be independent with his HEP.    Time 7    Period Weeks    Status On-going    Target Date 01/31/22      PT LONG TERM GOAL #2   Title Patient will score a 30 or lower with his quick DASH for improved function using his right upper extremity.    Baseline 43    Time 7    Period Weeks    Status On-going    Target Date 01/31/22      PT LONG TERM GOAL #3   Title Patient will be able to demonstrate at least 130 degrees of active right shoulder flexion for improved function reaching overhead.    Time 7    Period Weeks    Status On-going    Target Date 01/31/22      PT LONG TERM GOAL #4   Title Patient will be able to demonstrate at least 130 degrees of active right shoulder abduction for improved function reaching overhead.    Time 7    Period Weeks    Status On-going    Target Date 01/31/22                   Plan - 01/10/22 0954     Clinical Impression Statement Pt arrived today doing fairly well RT shldr and was able to continue with strengthening exs and progression. Normal Vaso response. MD F/U end of the month.    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    PT Duration Other (comment)    PT Treatment/Interventions Therapeutic activities;Therapeutic exercise;Neuromuscular re-education;Manual techniques;ADLs/Self Care Home Management;Electrical Stimulation;Iontophoresis 4mg /ml Dexamethasone;Cryotherapy;Vasopneumatic Device    PT Next Visit Plan DOS: 11/22/21; see protocol    Consulted and Agree with Plan of Care Patient             Patient will benefit from skilled therapeutic intervention in order to improve the following deficits and impairments:  Decreased range of motion, Impaired UE functional use, Decreased activity tolerance, Pain, Decreased strength  Visit Diagnosis: Acute pain of right shoulder  Muscle weakness (generalized)     Problem List Patient Active Problem List   Diagnosis Date Noted   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   Obesity, morbid, BMI 40.0-49.9 ([place]) 06/05/2021   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   Gastritis and gastroduodenitis    Gastroesophageal reflux disease with esophagitis without hemorrhage 12/01/2019   Belching    Gastric polyps    Hematochezia    Irritable bowel syndrome with diarrhea    Grade II hemorrhoids    Polyp of ascending colon    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   Seasonal and perennial allergic rhinitis 07/23/2014   Abdominal mass of other site 06/15/2012   Abdominal cramps 06/15/2012   Obstructive sleep apnea 05/03/2012   Syncope and collapse 05/03/2012   Class 3 severe obesity with serious comorbidity and body mass index (BMI) of 50.0 to 59.9 in adult [facility]) 05/03/2012  Anal fissure 05/27/2011   UNSPECIFIED ANEMIA 09/11/2009   Blood in stool 04/06/2009   Lower abdominal pain 03/07/2009   BIPOLAR DISORDER UNSPECIFIED 03/06/2009    SMOKELESS TOBACCO ABUSE 03/06/2009   Attention deficit hyperactivity disorder (ADHD) 03/06/2009   GERD 03/06/2009   Diarrhea 03/06/2009    Danyell Awbrey,CHRIS, PTA 01/10/2022, 10:17 AM  [facility] [address] [place], [place], [address] Phone: [phone]   Fax:  [phone]  Name: [name] MRN: [id number] Date of Birth: 1986/09/22

## 2022-01-14 ENCOUNTER — Other Ambulatory Visit: Payer: Self-pay

## 2022-01-14 ENCOUNTER — Ambulatory Visit: Payer: Medicaid Other | Admitting: Physical Therapy

## 2022-01-14 ENCOUNTER — Encounter: Payer: Self-pay | Admitting: Physical Therapy

## 2022-01-14 DIAGNOSIS — M25511 Pain in right shoulder: Secondary | ICD-10-CM | POA: Diagnosis not present

## 2022-01-14 DIAGNOSIS — M6281 Muscle weakness (generalized): Secondary | ICD-10-CM

## 2022-01-14 NOTE — Therapy (Signed)
[facility] [place], [place], [address] Phone: [phone]   Fax:  [phone]  Physical Therapy Treatment  Patient Details  Name: [name] MRN: [id number] Date of Birth: 1986/07/05 Referring Provider (PT): [name], MD   Encounter Date: 01/14/2022   PT End of Session - 01/14/22 0826     Visit Number 9    Number of Visits 14    Date for PT Re-Evaluation 02/22/22    Authorization - Number of Visits 16    PT Start Time 0816    PT Stop Time 0900    PT Time Calculation (min) 44 min    Activity Tolerance Patient tolerated treatment well    Behavior During Therapy [facility] for tasks assessed/performed             Past Medical History:  Diagnosis Date   Abscess    Right forearm healing   ADHD (attention deficit hyperactivity disorder)    Anal fissure    Anxiety    Arthritis    back, shoulders, ankles, knees   Back pain    Bipolar disorder (HCC)    Chest pain    Chronic lower back pain    Constipation    Depression    Esophagitis    Distal esophageal erosions consistent with mild erosive  reflux esophagitis 12/2008 by EGD    External hemorrhoids    Gastric polyps    Gastric ulcer    GERD (gastroesophageal reflux disease)    Hepatic steatosis    Hiatal hernia    History of stomach ulcers    Hx MRSA infection 06/2007   right thigh   Hx of colonic polyps    Hypercholesteremia    denies   Hypothyroidism    Internal hemorrhoids    Irritable bowel syndrome (IBS)    Obesity    Occult GI bleeding 12/2008   Trivial upper GI bleed/uncontrolled GERD by upper endoscopy 12/2008, normal f/u endoscopy 03/2009 with SBCE at that time   OSA on CPAP    Pain management    Pneumonia 09/01/2015   "on ATB still" (09/04/2015)   S/P colonoscopy 11/10, 10/08   Dr [name]   Schizophrenia [facility])    Sleep apnea    Stomach ulcer    Thyroid function test abnormal    Noted in 2011 discharge   Tobacco dipper    Wears  contact lenses     Past Surgical History:  Procedure Laterality Date   ANKLE FRACTURE SURGERY Left ~ 2008   BACK SURGERY     BIOPSY  11/02/2019   Procedure: BIOPSY;  Surgeon: Lavena Bullion, DO;  [facility];  Service: Gastroenterology;;  EGD and Colon   BIOPSY  06/07/2020   Procedure: BIOPSY;  Surgeon: Lavena Bullion, DO;  [facility];  Service: Gastroenterology;;   Franki Monte [place] STUDY N/A 06/07/2020   Procedure: BRAVO [place] STUDY on PPI;  Surgeon: Lavena Bullion, DO;  [facility];  Service: Gastroenterology;  Laterality: N/A;   COLONOSCOPY  10/10/2009   anal papilla otherwise normal   COLONOSCOPY WITH PROPOFOL N/A 11/02/2019   Procedure: COLONOSCOPY WITH PROPOFOL;  Surgeon: Lavena Bullion, DO;  [facility];  Service: Gastroenterology;  Laterality: N/A;   ESOPHAGOGASTRODUODENOSCOPY  04/07/2009   Normal esophagus, small hiatal hernia   ESOPHAGOGASTRODUODENOSCOPY  01/09/2009   Distal esophageal erosions consistent with mild erosive reflux esophagitis, otherwise normal esophagus, small hiatal herniaotherwise normal stomach, D1-D2    ESOPHAGOGASTRODUODENOSCOPY  08/26/2007  Normal esophagus, a small hiatal/hernia, otherwise normal stomach D1 through D3   ESOPHAGOGASTRODUODENOSCOPY (EGD) WITH PROPOFOL N/A 11/02/2019   Procedure: ESOPHAGOGASTRODUODENOSCOPY (EGD) WITH PROPOFOL;  Surgeon: Lavena Bullion, DO;  [facility];  Service: Gastroenterology;  Laterality: N/A;   ESOPHAGOGASTRODUODENOSCOPY (EGD) WITH PROPOFOL N/A 06/07/2020   Procedure: ESOPHAGOGASTRODUODENOSCOPY (EGD) WITH PROPOFOL;  Surgeon: Lavena Bullion, DO;  [facility];  Service: Gastroenterology;  Laterality: N/A;   FRACTURE SURGERY     GASTRIC BYPASS     GASTRIC ROUX-EN-Y N/A 06/05/2021   Procedure: LAPAROSCOPIC ROUX-EN-Y GASTRIC BYPASS WITH UPPER ENDOSCOPY;  Surgeon: Clovis Riley, MD;  [facility];  Service: General;  Laterality: N/A;    ileocolonoscopy  08/26/2007    A normal rectum, colon, and terminal ileum   INCISION AND DRAINAGE  09/04/2015   "reopened my back incsion"   LUMBAR LAMINECTOMY/DECOMPRESSION MICRODISCECTOMY Left 09/23/2017   Procedure: Microdiscectomy - left - Lumbar four-Lumbar five;  Surgeon: Earnie Larsson, MD;  Location: [place];  Service: Neurosurgery;  Laterality: Left;   LUMBAR MICRODISCECTOMY  08/21/2015   LUMBAR MICRODISCECTOMY Left 09/23/2017   L4-5   LUMBAR WOUND DEBRIDEMENT N/A 09/04/2015   Procedure: LUMBAR WOUND DEBRIDEMENT;  Surgeon: Consuella Lose, MD;  Location: [place] [facility];  Service: Neurosurgery;  Laterality: N/A;   right side sugery     enlarged lymph node gland removed under arm pit age 24-5 years   SHOULDER ARTHROSCOPY WITH DISTAL CLAVICLE RESECTION Right 11/22/2021   Procedure: SHOULDER ARTHROSCOPY WITH SUBACROMIAL DECOMPRESSION AND DISTAL CLAVICLE EXCISION;  Surgeon: Mordecai Rasmussen, MD;  [facility];  Service: Orthopedics;  Laterality: Right;   Small bowel capsule  04/11/2009    normal throughout   VASECTOMY      There were no vitals filed for this visit.   Subjective Assessment - 01/14/22 0809     Subjective Reports elbow soreness after cutting and splitting and loading wood yesterday.    Limitations Lifting    Patient Stated Goals return to work, raise his arm    Currently in Pain? No/denies                [facility] PT Assessment - 01/14/22 0001       Assessment   Medical Diagnosis Arthralgia of right AC joint    Referring Provider (PT) [name], MD    Onset Date/Surgical Date 11/22/21    Hand Dominance Right    Prior Therapy No      Precautions   Precautions Shoulder                           [facility] Adult PT Treatment/Exercise - 01/14/22 0001       Shoulder Exercises: Standing   Protraction Strengthening;Right;20 reps;Theraband    Theraband Level (Shoulder Protraction) Level 3 (Green)    External Rotation Strengthening;Right;20  reps;Theraband    Theraband Level (Shoulder External Rotation) Level 2 (Red)    External Rotation Limitations walkouts    Internal Rotation Strengthening    Theraband Level (Shoulder Internal Rotation) Level 3 (Green)    Flexion Strengthening;Right;20 reps;Weights    Shoulder Flexion Weight (lbs) 3    ABduction Strengthening;Right;20 reps;Weights    Shoulder ABduction Weight (lbs) 3    Extension Strengthening;Right;20 reps;Theraband    Extension Limitations Blue XTS    Row Strengthening;Right;20 reps;Theraband    Row Limitations Blue XTS    Other Standing Exercises 3# R shoulder scaption x20 rpes    Other Standing  Exercises 3# OH press x20 reps      Shoulder Exercises: Pulleys   Flexion 3 minutes      Shoulder Exercises: ROM/Strengthening   UBE (Upper Arm Bike) 60 RPM x 10 min    Wall Wash CW x fatigue, CCW x fatigue    Wall Pushups 20 reps      Modalities   Modalities Vasopneumatic      Vasopneumatic   Number Minutes Vasopneumatic  10 minutes    Vasopnuematic Location  Shoulder    Vasopneumatic Pressure Low    Vasopneumatic Temperature  34                          PT Long Term Goals - 12/18/21 0854       PT LONG TERM GOAL #1   Title Patient will be independent with his HEP.    Time 7    Period Weeks    Status On-going    Target Date 01/31/22      PT LONG TERM GOAL #2   Title Patient will score a 30 or lower with his quick DASH for improved function using his right upper extremity.    Baseline 43    Time 7    Period Weeks    Status On-going    Target Date 01/31/22      PT LONG TERM GOAL #3   Title Patient will be able to demonstrate at least 130 degrees of active right shoulder flexion for improved function reaching overhead.    Time 7    Period Weeks    Status On-going    Target Date 01/31/22      PT LONG TERM GOAL #4   Title Patient will be able to demonstrate at least 130 degrees of active right shoulder abduction for improved function  reaching overhead.    Time 7    Period Weeks    Status On-going    Target Date 01/31/22                   Plan - 01/14/22 0858     Clinical Impression Statement Patient presented in clinic with reports of only elbow soreness as he was splitting wood over the weekend. Patient progressed through more advanced strengthening with only complaints of fatigue as therex session progressed. Patient was told by surgeon that R rotator cuff was not involved in surgery but increased weakness noted with ER. Patient states that he is unable to complete ER fully in either shoulder even before surgery. Walkouts were utilized for ER strengthening due to limitations with weakness. Normal vasopneumatic response noted following removal of the modality.    Personal Factors and Comorbidities Profession;Comorbidity 1    Comorbidities OA    Examination-Activity Limitations Reach Overhead;Bathing;Self Feeding;Carry;Dressing;Lift    Examination-Participation Restrictions Occupation;Community Activity;Driving;Volunteer;Yard Work    Stability/Clinical Decision Making Stable/Uncomplicated    Rehab Potential Excellent    PT Frequency 2x / week    PT Duration Other (comment)    PT Treatment/Interventions Therapeutic activities;Therapeutic exercise;Neuromuscular re-education;Manual techniques;ADLs/Self Care Home Management;Electrical Stimulation;Iontophoresis 4mg /ml Dexamethasone;Cryotherapy;Vasopneumatic Device    PT Next Visit Plan DOS: 11/22/21; see protocol    Consulted and Agree with Plan of Care Patient             Patient will benefit from skilled therapeutic intervention in order to improve the following deficits and impairments:  Decreased range of motion, Impaired UE functional use, Decreased activity tolerance, Pain, Decreased strength  Visit Diagnosis: Acute pain of right shoulder  Muscle weakness (generalized)     Problem List Patient Active Problem List   Diagnosis Date Noted   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   Obesity, morbid, BMI 40.0-49.9 ([place]) 06/05/2021   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   Gastritis and gastroduodenitis    Gastroesophageal reflux disease with esophagitis without hemorrhage 12/01/2019   Belching    Gastric polyps    Hematochezia    Irritable bowel syndrome with diarrhea    Grade II hemorrhoids    Polyp of ascending colon    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   Seasonal and perennial allergic rhinitis 07/23/2014   Abdominal mass of other site 06/15/2012   Abdominal cramps 06/15/2012   Obstructive sleep apnea 05/03/2012   Syncope and collapse 05/03/2012   Class 3 severe obesity with serious comorbidity and body mass index (BMI) of 50.0 to 59.9 in adult ([place]) 05/03/2012   Anal fissure 05/27/2011   UNSPECIFIED ANEMIA 09/11/2009   Blood in stool 04/06/2009   Lower abdominal pain 03/07/2009   BIPOLAR DISORDER UNSPECIFIED 03/06/2009   SMOKELESS TOBACCO ABUSE 03/06/2009   Attention deficit hyperactivity disorder (ADHD) 03/06/2009   GERD 03/06/2009   Diarrhea 03/06/2009    Standley Brooking, PTA 01/14/2022, 9:11 AM  [facility] [address] [place], [place], [address] Phone: [phone]   Fax:  [phone]  Name: PASQUALE MATTERS MRN: [id number] Date of Birth: [birth year]

## 2022-01-16 ENCOUNTER — Other Ambulatory Visit: Payer: Self-pay

## 2022-01-16 ENCOUNTER — Ambulatory Visit: Payer: Medicaid Other

## 2022-01-16 DIAGNOSIS — M25511 Pain in right shoulder: Secondary | ICD-10-CM

## 2022-01-16 DIAGNOSIS — M6281 Muscle weakness (generalized): Secondary | ICD-10-CM

## 2022-01-16 NOTE — Therapy (Addendum)
[facility] [place], [place], [address] Phone: [phone]   Fax:  [phone]  Physical Therapy Treatment  Patient Details  Name: [name] MRN: [id number] Date of Birth: [birth year] Referring Provider (PT): [name], MD   Encounter Date: 01/16/2022   PT End of Session - 01/16/22 0812     Visit Number 10    Number of Visits 14    Date for PT Re-Evaluation 02/22/22    Authorization - Number of Visits 16    PT Start Time 0815    PT Stop Time 0908    PT Time Calculation (min) 53 min    Activity Tolerance Patient tolerated treatment well    Behavior During Therapy [facility] for tasks assessed/performed             Past Medical History:  Diagnosis Date   Abscess    Right forearm healing   ADHD (attention deficit hyperactivity disorder)    Anal fissure    Anxiety    Arthritis    back, shoulders, ankles, knees   Back pain    Bipolar disorder (HCC)    Chest pain    Chronic lower back pain    Constipation    Depression    Esophagitis    Distal esophageal erosions consistent with mild erosive  reflux esophagitis 12/2008 by EGD    External hemorrhoids    Gastric polyps    Gastric ulcer    GERD (gastroesophageal reflux disease)    Hepatic steatosis    Hiatal hernia    History of stomach ulcers    Hx MRSA infection 06/2007   right thigh   Hx of colonic polyps    Hypercholesteremia    denies   Hypothyroidism    Internal hemorrhoids    Irritable bowel syndrome (IBS)    Obesity    Occult GI bleeding 12/2008   Trivial upper GI bleed/uncontrolled GERD by upper endoscopy 12/2008, normal f/u endoscopy 03/2009 with SBCE at that time   OSA on CPAP    Pain management    Pneumonia 09/01/2015   "on ATB still" (09/04/2015)   S/P colonoscopy 11/10, 10/08   Dr [name]   Schizophrenia [facility])    Sleep apnea    Stomach ulcer    Thyroid function test abnormal    Noted in 2011 discharge   Tobacco dipper    Wears  contact lenses     Past Surgical History:  Procedure Laterality Date   ANKLE FRACTURE SURGERY Left ~ 2008   BACK SURGERY     BIOPSY  11/02/2019   Procedure: BIOPSY;  Surgeon: Lavena Bullion, DO;  [facility];  Service: Gastroenterology;;  EGD and Colon   BIOPSY  06/07/2020   Procedure: BIOPSY;  Surgeon: Lavena Bullion, DO;  [facility];  Service: Gastroenterology;;   Franki Monte [place] STUDY N/A 06/07/2020   Procedure: BRAVO [place] STUDY on PPI;  Surgeon: Lavena Bullion, DO;  [facility];  Service: Gastroenterology;  Laterality: N/A;   COLONOSCOPY  10/10/2009   anal papilla otherwise normal   COLONOSCOPY WITH PROPOFOL N/A 11/02/2019   Procedure: COLONOSCOPY WITH PROPOFOL;  Surgeon: Lavena Bullion, DO;  [facility];  Service: Gastroenterology;  Laterality: N/A;   ESOPHAGOGASTRODUODENOSCOPY  04/07/2009   Normal esophagus, small hiatal hernia   ESOPHAGOGASTRODUODENOSCOPY  01/09/2009   Distal esophageal erosions consistent with mild erosive reflux esophagitis, otherwise normal esophagus, small hiatal herniaotherwise normal stomach, D1-D2    ESOPHAGOGASTRODUODENOSCOPY  08/26/2007  Normal esophagus, a small hiatal/hernia, otherwise normal stomach D1 through D3   ESOPHAGOGASTRODUODENOSCOPY (EGD) WITH PROPOFOL N/A 11/02/2019   Procedure: ESOPHAGOGASTRODUODENOSCOPY (EGD) WITH PROPOFOL;  Surgeon: Lavena Bullion, DO;  [facility];  Service: Gastroenterology;  Laterality: N/A;   ESOPHAGOGASTRODUODENOSCOPY (EGD) WITH PROPOFOL N/A 06/07/2020   Procedure: ESOPHAGOGASTRODUODENOSCOPY (EGD) WITH PROPOFOL;  Surgeon: Lavena Bullion, DO;  [facility];  Service: Gastroenterology;  Laterality: N/A;   FRACTURE SURGERY     GASTRIC BYPASS     GASTRIC ROUX-EN-Y N/A 06/05/2021   Procedure: LAPAROSCOPIC ROUX-EN-Y GASTRIC BYPASS WITH UPPER ENDOSCOPY;  Surgeon: Clovis Riley, MD;  [facility];  Service: General;  Laterality: N/A;    ileocolonoscopy  08/26/2007    A normal rectum, colon, and terminal ileum   INCISION AND DRAINAGE  09/04/2015   "reopened my back incsion"   LUMBAR LAMINECTOMY/DECOMPRESSION MICRODISCECTOMY Left 09/23/2017   Procedure: Microdiscectomy - left - Lumbar four-Lumbar five;  Surgeon: Earnie Larsson, MD;  Location: [place];  Service: Neurosurgery;  Laterality: Left;   LUMBAR MICRODISCECTOMY  08/21/2015   LUMBAR MICRODISCECTOMY Left 09/23/2017   L4-5   LUMBAR WOUND DEBRIDEMENT N/A 09/04/2015   Procedure: LUMBAR WOUND DEBRIDEMENT;  Surgeon: Consuella Lose, MD;  Location: [place] [facility];  Service: Neurosurgery;  Laterality: N/A;   right side sugery     enlarged lymph node gland removed under arm pit age 21-5 years   SHOULDER ARTHROSCOPY WITH DISTAL CLAVICLE RESECTION Right 11/22/2021   Procedure: SHOULDER ARTHROSCOPY WITH SUBACROMIAL DECOMPRESSION AND DISTAL CLAVICLE EXCISION;  Surgeon: Mordecai Rasmussen, MD;  [facility];  Service: Orthopedics;  Laterality: Right;   Small bowel capsule  04/11/2009    normal throughout   VASECTOMY      There were no vitals filed for this visit.   Subjective Assessment - 01/16/22 0812     Subjective Pt arrives for today's treatment session denying any pain. Pt is ready to discharge today.    Limitations Lifting    Patient Stated Goals return to work, raise his arm    Currently in Pain? No/denies                       Katina Dung - 01/16/22 0001     Open a tight or new jar No difficulty    Do heavy household chores (wash walls, wash floors) No difficulty    Carry a shopping bag or briefcase No difficulty    Wash your back No difficulty    Use a knife to cut food No difficulty    Recreational activities in which you take some force or impact through your arm, shoulder, or hand (golf, hammering, tennis) No difficulty    During the past week, to what extent has your arm, shoulder or hand problem interfered with your normal social activities with  family, friends, neighbors, or groups? Slightly    During the past week, to what extent has your arm, shoulder or hand problem limited your work or other regular daily activities Slightly    Arm, shoulder, or hand pain. Mild    Tingling (pins and needles) in your arm, shoulder, or hand None    Difficulty Sleeping No difficulty    DASH Score 6.82 %                    [facility] Adult PT Treatment/Exercise - 01/16/22 0001       Shoulder Exercises: Standing   Protraction Strengthening;Right;20 reps;Theraband  Theraband Level (Shoulder Protraction) Level 3 (Green)    External Rotation Strengthening;Right;20 reps;Theraband    Theraband Level (Shoulder External Rotation) Level 2 (Red)    Internal Rotation Strengthening    Theraband Level (Shoulder Internal Rotation) Level 3 (Green)    Flexion Strengthening;Right;20 reps;Weights    Shoulder Flexion Weight (lbs) 3    ABduction Strengthening;Right;20 reps;Weights    Shoulder ABduction Weight (lbs) 3    Extension Strengthening;Right;20 reps;Theraband    Theraband Level (Shoulder Extension) Level 3 (Green)    Row Strengthening;Right;20 reps;Theraband    Theraband Level (Shoulder Row) Level 3 (Green)    Other Standing Exercises 3# R shoulder scaption x20 rpes    Other Standing Exercises 3# OH press x20 reps      Shoulder Exercises: Pulleys   Flexion 5 minutes      Shoulder Exercises: ROM/Strengthening   UBE (Upper Arm Bike) 60 RPM x 10 min    Wall Wash CW x fatigue, CCW x fatigue    Wall Pushups 20 reps      Modalities   Modalities Vasopneumatic      Vasopneumatic   Number Minutes Vasopneumatic  15 minutes    Vasopnuematic Location  Shoulder    Vasopneumatic Pressure Low    Vasopneumatic Temperature  34                          PT Long Term Goals - 01/16/22 0825       PT LONG TERM GOAL #1   Title Patient will be independent with his HEP.    Time 7    Period Weeks    Status Achieved    Target Date  01/31/22      PT LONG TERM GOAL #2   Title Patient will score a 30 or lower with his quick DASH for improved function using his right upper extremity.    Baseline 43; 01/16/22: 6.82%    Time 7    Period Weeks    Status Achieved    Target Date 01/31/22      PT LONG TERM GOAL #3   Title Patient will be able to demonstrate at least 130 degrees of active right shoulder flexion for improved function reaching overhead.    Baseline 01/16/22:175 degrees    Time 7    Period Weeks    Status Achieved    Target Date 01/31/22      PT LONG TERM GOAL #4   Title Patient will be able to demonstrate at least 130 degrees of active right shoulder abduction for improved function reaching overhead.    Baseline 01/16/22: 160 degrees    Time 7    Period Weeks    Status Achieved    Target Date 01/31/22                   Plan - 01/16/22 0813     Clinical Impression Statement Pt arrives for today's treatment session denying any pain and states that he is ready for discharge today.  Pt continues to exhibit weakness with ER.  Pt's QuickDASH score reduced to 6.8%.  Pt able to demonstrate 175 degrees of active flexion and 160 degrees of active abduction.  Pt has meet all of the goals set forth for him by phyiscal therapy at this time.  Pt encouraged to call the facility with any questions or concerns. Pt denied any pain at completion of today's treatment session.    Personal Factors and Comorbidities  Profession;Comorbidity 1    Comorbidities OA    Examination-Activity Limitations Reach Overhead;Bathing;Self Feeding;Carry;Dressing;Lift    Examination-Participation Restrictions Occupation;Community Activity;Driving;Volunteer;Yard Work    Stability/Clinical Decision Making Stable/Uncomplicated    Rehab Potential Excellent    PT Frequency 2x / week    PT Duration Other (comment)    PT Treatment/Interventions Therapeutic activities;Therapeutic exercise;Neuromuscular re-education;Manual techniques;ADLs/Self  Care Home Management;Electrical Stimulation;Iontophoresis 29m/ml Dexamethasone;Cryotherapy;Vasopneumatic Device    PT Next Visit Plan DOS: 11/22/21; see protocol    Consulted and Agree with Plan of Care Patient             Patient will benefit from skilled therapeutic intervention in order to improve the following deficits and impairments:  Decreased range of motion, Impaired UE functional use, Decreased activity tolerance, Pain, Decreased strength  Visit Diagnosis: Acute pain of right shoulder  Muscle weakness (generalized)     Problem List Patient Active Problem List   Diagnosis Date Noted   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   Obesity, morbid, BMI 40.0-49.9 [place] 06/05/2021   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   Gastritis and gastroduodenitis    Gastroesophageal reflux disease with esophagitis without hemorrhage 12/01/2019   Belching    Gastric polyps    Hematochezia    Irritable bowel syndrome with diarrhea    Grade II hemorrhoids    Polyp of ascending colon    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   Seasonal and perennial allergic rhinitis 07/23/2014   Abdominal mass of other site 06/15/2012   Abdominal cramps 06/15/2012   Obstructive sleep apnea 05/03/2012   Syncope and collapse 05/03/2012   Class 3 severe obesity with serious comorbidity and body mass index (BMI) of 50.0 to 59.9 in adult ([place] 05/03/2012   Anal fissure 05/27/2011   UNSPECIFIED ANEMIA 09/11/2009   Blood in stool 04/06/2009   Lower abdominal pain 03/07/2009   BIPOLAR DISORDER UNSPECIFIED 03/06/2009   SMOKELESS TOBACCO ABUSE 03/06/2009   Attention deficit hyperactivity disorder (ADHD) 03/06/2009   GERD 03/06/2009   Diarrhea 03/06/2009    JKathrynn Ducking PTA 01/16/2022, 9:12 AM  [facility] [address] [address]Phone: [phone]  Fax:  [phone] Name: Phillip LUKASMRN:  [id number] of Birth: [birth year] PHYSICAL THERAPY DISCHARGE SUMMARY  Visits from Start of Care: 10  Current functional level related to goals / functional outcomes: Patient was able to meet all his goals for therapy.    Remaining deficits: None    Education / Equipment: HEP    Patient agrees to discharge. Patient goals were met. Patient is being discharged due to meeting the stated rehab goals.  JJacqulynn Cadet PT, DPT

## 2022-01-17 ENCOUNTER — Ambulatory Visit: Payer: Medicaid Other | Admitting: Skilled Nursing Facility1

## 2022-01-17 ENCOUNTER — Telehealth: Payer: Self-pay | Admitting: Orthopedic Surgery

## 2022-01-17 NOTE — Telephone Encounter (Signed)
Patient came to office this afternoon, 1:50pm, requesting a release to work note. States he has been out of work since 11/13/21, due to shoulder injury and surgery, and said wants to go back to work now. Aware of his appointment Tuesday, 01/22/22. May he be seen tomorrow, Friday, 01/18/22, to evaluate for release to work?

## 2022-01-17 NOTE — Telephone Encounter (Signed)
Called patient; scheduled tomorrow for office visit and work release note.

## 2022-01-18 ENCOUNTER — Encounter: Payer: Self-pay | Admitting: Orthopedic Surgery

## 2022-01-18 ENCOUNTER — Other Ambulatory Visit: Payer: Self-pay

## 2022-01-18 ENCOUNTER — Ambulatory Visit (INDEPENDENT_AMBULATORY_CARE_PROVIDER_SITE_OTHER): Payer: Medicaid Other | Admitting: Orthopedic Surgery

## 2022-01-18 VITALS — Ht 70.0 in | Wt 308.0 lb

## 2022-01-18 DIAGNOSIS — M25511 Pain in right shoulder: Secondary | ICD-10-CM

## 2022-01-18 NOTE — Progress Notes (Signed)
Orthopaedic Postop Note  Assessment: [name] is a 36 y.o. male s/p right shoulder arthroscopy, subacromial decompression and distal clavicle excision  DOS: 11/22/2021  Plan: He has no pain in his right shoulder.  Range of motion has returned to normal.  He has excellent strength.  He is ready to return to work.  It is reasonable at this time.  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  Follow-up as needed.  Follow-up: Return if symptoms worsen or fail to improve. XR at next visit: None  Subjective:  Chief Complaint  Patient presents with   Routine Post Op    Rt shoulder DOS 11/22/21    History of Present Illness: [name] is a 36 y.o. male who presents following the above stated procedure.  Surgery was approximately 9 weeks ago.  He denies pain in the shoulder.  No issues with the surgical incisions.  He has near full range of motion, without discomfort.  He notes occasional popping, which is not painful.  He has been discharged from physical therapy.  He is ready to return to work.   Review of Systems: No fevers or chills No numbness or tingling No Chest Pain No shortness of breath   Objective: Ht 5\' 10"  (1.778 m)    Wt (!) 308 lb (139.7 kg)    BMI 44.19 kg/m   Physical Exam:  Alert and oriented.  No acute distress.  Evaluation of the right shoulder demonstrates well-healing surgical incisions.  No surrounding erythema or drainage.  He has no bruising or swelling.  160 degrees of forward flexion.  100 degrees of abduction.  Internal rotation to his lumbar spine.  Supraspinatus, infraspinatus and subscapularis strength are all 4/5.  4/5 deltoid strength.  Fingers are warm and well-perfused.  Sensation is intact throughout the right hand.  IMAGING: I personally ordered and reviewed the following images:  No new imaging obtained today.   Mordecai Rasmussen, MD 01/18/2022 10:45 PM

## 2022-01-18 NOTE — Patient Instructions (Signed)
Ok to return to work, no restrictions.  Please provide 2 copies

## 2022-01-22 ENCOUNTER — Encounter: Payer: Medicaid Other | Admitting: Orthopedic Surgery

## 2022-01-23 ENCOUNTER — Other Ambulatory Visit: Payer: Self-pay

## 2022-01-23 ENCOUNTER — Encounter: Payer: Medicaid Other | Attending: Surgery | Admitting: Skilled Nursing Facility1

## 2022-01-23 DIAGNOSIS — Z6841 Body Mass Index (BMI) 40.0 and over, adult: Secondary | ICD-10-CM

## 2022-01-23 NOTE — Progress Notes (Signed)
Bariatric Nutrition Follow-Up Visit ?Medical Nutrition Therapy  ? ?Post-Operative RYGB Surgery ? ? ?NUTRITION ASSESSMENT ?  ? ?Surgery date: 06/05/2021 ?Surgery type: RYGB ?Start weight at [facility]: 398 pounds ?Weight today: 297 pounds ? ?Clinical  ?Medical hx: schizophrenia, reflux ?Medications: reduced protonix  ?Labs: WNL ?Notable signs/symptoms:  ?Any previous deficiencies? Vitamin D ?  ?Body Composition Scale 06/19/2021 07/31/2021 10/17/2021 01/23/2022  ?Current Body Weight 374.2 337.2 314.3 297  ?Total Body Fat % 42.4 39.9 38 36.5  ?Visceral Fat 38 33 30 27  ?Fat-Free Mass % 57.5 60 61.9 63.4  ? Total Body Water % 38.5 41 42.9 44.4  ?Muscle-Mass lbs 70.7 63.7 59.3 56  ?BMI 55.1 49.6 46.2 43.6  ?Body Fat Displacement      ?       Torso  lbs 98.6 83.6 74.3 67.3  ?       Left Leg  lbs 19.7 16.7 14.8 13.4  ?       Right Leg  lbs 19.7 16.7 14.8 13.4  ?       Left Arm  lbs 9.8 8.3 7.4 6.7  ?       Right Arm   lbs 9.8 8.3 7.4 6.7  ? ?  ?Lifestyle & Dietary Hx ? ?Pt states he had shoulder surgery which went well and is about all healed with no issues.  ?Pt states he feels really well with good energy.  ?Pt states he stopped eating after super. Pt states he tries to eat at the same time every day.  ?Pt states he has a small serving of dr pepper and half and half tea. ?Pt states he thinks maybe the stool softeners will sometimes cause diarrhea.  ?Pt sates he keeps dry mouth from one of his medicines.  ?Pt state she has been tempted lately by dip but is trying hard to stay away.  ?Pt states he did not have his medication for a while but has it now so feeling better for mental health.  ?Pt states he is going to discuss skin removal with his surgeon.  ? 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 ?24-Hr Dietary Recall ?First Meal: tenderloin + egg or egg +  watermelon  ?Snack 9am: special k bar or peanuts  ?Second Meal 12:30-1: deli meat + cheese + nuts + fruit in lite syrup or water ?Snack 3: nuts or trail mix or beef jerky or protein bar ?Third Meal: pork chop or chicken or steak or shrimp + beans + broccoli or peas or green beans sometimes mashed potatoes ?Snack:  ?Beverages: water, gatorade zero, dr pepper zero, 2% milk, half and half tea ? ?Post-Op Goals/ Signs/ Symptoms ?Using straws: no ?Drinking while eating: no ?Chewing/swallowing difficulties: no ?Changes in vision: no ?Changes to mood/headaches: no ?Hair loss/changes to skin/nails: no ?Difficulty focusing/concentrating: no ?Sweating: no ?Dizziness/lightheadedness: no ?Palpitations: no  ?Carbonated/caffeinated beverages: no ?N/V/D/C/Gas: no ?Abdominal pain: no ?Dumping syndrome: no ? ?  ?NUTRITION DIAGNOSIS  ?Overweight/obesity ([place]-3.3) related to past poor dietary habits and physical inactivity as evidenced by completed bariatric surgery and following dietary guidelines for continued weight loss and healthy nutrition status. ?  ?  ?NUTRITION INTERVENTION ?Nutrition counseling (C-1) and education (E-2) to facilitate bariatric surgery goals, including: ?The importance of consuming adequate calories as well as certain nutrients daily due to the body's need for essential vitamins, minerals,  and fats ?The importance of daily physical activity and to reach a goal of at least 150 minutes of moderate to vigorous physical activity weekly (or as directed by their physician) due to benefits such as increased musculature and improved lab values ?The importance of intuitive eating specifically learning hunger-satiety cues and understanding the importance of learning a new body: The importance of mindful eating to avoid grazing behaviors  ?Encouraged patient to honor their body's internal hunger and fullness cues.  Throughout the day, check in mentally and rate hunger. Stop eating when satisfied not full regardless of how  much food is left on the plate.  Get more if still hungry 20-30 minutes later.  The key is to honor satisfaction so throughout the meal, rate fullness factor and stop when comfortably satisfied not physically full. The key is to honor hunger and fullness without any feelings of guilt or shame.  Pay attention to what the internal cues are, rather than any external factors. This will enhance the confidence you have in listening to your own body and following those internal cues enabling you to increase how often you eat when you are hungry not out of appetite and stop when you are satisfied not full.  ?Encouraged pt to continue to eat balanced meals inclusive of non starchy vegetables 2 times a day 7 days a week ?Encouraged pt to choose lean protein sources: limiting beef, pork, sausage, hotdogs, and lunch meat ?Encourage pt to choose healthy fats such as plant based limiting animal fats ?Encouraged pt to continue to drink a minium 64 fluid ounces with half being plain water to satisfy proper hydration  ? ?Goals: ?Try the biotene mints ?Keep up your awareness of not falling into drinking caloric beverages  ?Reduce the fattier meats and increase lean proteins such as fish, chicken, and [place] ? ?Handouts Provided Include  ?Phase 6 ? ?Learning Style & Readiness for Change ?Teaching method utilized: Visual & Auditory  ?Demonstrated degree of understanding via: Teach Back  ?Readiness Level: Action ?Barriers to learning/adherence to lifestyle change: none identified  ? ?RD's Notes for Next Visit ?Assess adherence to pt chosen goals  ? ? ?MONITORING & EVALUATION ?Dietary intake, weekly physical activity, body weight ? ?Next Steps ?Patient is to follow-up June ? ?

## 2022-02-15 ENCOUNTER — Other Ambulatory Visit: Payer: Self-pay | Admitting: Family Medicine

## 2022-02-20 ENCOUNTER — Ambulatory Visit (INDEPENDENT_AMBULATORY_CARE_PROVIDER_SITE_OTHER): Payer: Medicaid Other | Admitting: Family Medicine

## 2022-02-20 VITALS — BP 119/72 | HR 86 | Temp 98.1°F | Ht 69.0 in | Wt 303.2 lb

## 2022-02-20 DIAGNOSIS — G4452 New daily persistent headache (NDPH): Secondary | ICD-10-CM

## 2022-02-20 MED ORDER — NURTEC 75 MG PO TBDP: 1.0000 | ORAL_TABLET | Freq: Every day | ORAL | 0 refills | Status: DC | PRN

## 2022-02-20 NOTE — Patient Instructions (Signed)
Form - Headache Record ?There are many types and causes of headaches. A headache record can help guide your treatment plan. Use this form to record the details. Bring this form with you to your follow-up visits. ?Follow your health care provider's instructions on how to describe your headache. You may be asked to: ?Use a pain scale. This is a tool to rate the intensity of your headache using words or numbers. ?Describe what your headache feels like, such as dull, achy, throbbing, or sharp. ?Headache record ?Date: _______________ Time (from start to end): ____________________ Location of the headache: _________________________ ?Intensity of the headache: ____________________ Description of the headache: ______________________________________________________________ ?Hours of sleep the night before the headache: __________ ?Food or drinks before the headache started: ______________________________________________________________________________________ ?Events before the headache started: _______________________________________________________________________________________________ ?Symptoms before the headache started: __________________________________________________________________________________________ ?Symptoms during the headache: __________________________________________________________________________________________________ ?Treatment: ________________________________________________________________________________________________________________ ?Effect of treatment: _________________________________________________________________________________________________________ ?Other comments: ___________________________________________________________________________________________________________ ?Date: _______________ Time (from start to end): ____________________ Location of the headache: _________________________ ?Intensity of the headache: ____________________ Description of the headache:  ______________________________________________________________ ?Hours of sleep the night before the headache: __________ ?Food or drinks before the headache started: ______________________________________________________________________________________ ?Events before the headache started: ____________________________________________________________________________________________ ?Symptoms before the headache started: _________________________________________________________________________________________ ?Symptoms during the headache: _______________________________________________________________________________________________ ?Treatment: ________________________________________________________________________________________________________________ ?Effect of treatment: _________________________________________________________________________________________________________ ?Other comments: ___________________________________________________________________________________________________________ ?Date: _______________ Time (from start to end): ____________________ Location of the headache: _________________________ ?Intensity of the headache: ____________________ Description of the headache: ______________________________________________________________ ?Hours of sleep the night before the headache: __________ ?Food or drinks before the headache started: ______________________________________________________________________________________ ?Events before the headache started: ____________________________________________________________________________________________ ?Symptoms before the headache started: _________________________________________________________________________________________ ?Symptoms during the headache: _______________________________________________________________________________________________ ?Treatment:  ________________________________________________________________________________________________________________ ?Effect of treatment: _________________________________________________________________________________________________________ ?Other comments: ___________________________________________________________________________________________________________ ?Date: _______________ Time (from start to end): ____________________ Location of the headache: _________________________ ?Intensity of the headache: ____________________ Description of the headache: ______________________________________________________________ ?Hours of sleep the night before the headache: _________ ?Food or drinks before the headache started: ______________________________________________________________________________________ ?Events before the headache started: ____________________________________________________________________________________________ ?Symptoms before the headache started: _________________________________________________________________________________________ ?Symptoms during the headache: _______________________________________________________________________________________________ ?Treatment: ________________________________________________________________________________________________________________ ?Effect of treatment: _________________________________________________________________________________________________________ ?Other comments: ___________________________________________________________________________________________________________ ?Date: _______________ Time (from start to end): ____________________ Location of the headache: _________________________ ?Intensity of the headache: ____________________ Description of the headache: ______________________________________________________________ ?Hours of sleep the night before the headache: _________ ?Food or drinks before the headache started:  ______________________________________________________________________________________ ?Events before the headache started: ____________________________________________________________________________________________ ?Symptoms before the headache started: _________________________________________________________________________________________ ?Symptoms during the headache: _______________________________________________________________________________________________ ?Treatment: ________________________________________________________________________________________________________________ ?Effect of treatment: _________________________________________________________________________________________________________ ?Other comments: ___________________________________________________________________________________________________________ ?This information is not intended to replace advice given to you by your health care provider. Make sure you discuss any questions you have with your health care provider. ?Document Revised: 04/11/2021 Document Reviewed: 04/11/2021 ?Elsevier Patient Education ? [place]. ? ?

## 2022-02-20 NOTE — Progress Notes (Signed)
? ?Subjective: ?CC: Headaches ?PCP: [name], DO ?ZOX:WRUEAVWU B Youse is a 36 y.o. male presenting to clinic today for: ? ?1.  Headaches ?He notes that symptoms have been intermittent over the last 3 weeks.  This was approximately when he started working but also around the time he started on Vyvanse.  He had not had any problems like this with Adderall but does feel that the Vyvanse is really working for him.  He hydrates well and feels like he gets a fair amount of sleep.  He is using his CPAP intermittently but has not been associating the migraine headaches on the days that he does not use a CPAP.  He reports photophobia and nausea when the headaches occur and he also has blurry vision during that time.  He is not having any neurologic deficits outside of the headaches.  Denies any balance changes, difficulty forming words, double vision or loss of vision.  His mother apparently had a history of a brain tumor that was nonresectable.  His son has history of migraine headaches and sees a neurologist for this.  Patient denies any sinus symptoms.  He denies any vision strain or blurred vision outside of the headaches.  Does not really consume foods that contain nitrates frequently.  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 ? ? ?ROS: Per HPI ? ?Allergies  ?Allergen Reactions  ? Ibuprofen Other (See Comments)  ?  Makes ulcers bleed  ? Influenza Vaccines Shortness Of Breath and Other (See Comments)  ?  Rash and unable to breathe well  ? Tylenol [Acetaminophen] Hives  ? Bee Venom Swelling  ?  SWELLING REACTION UNSPECIFIED   ? Tramadol Itching  ? ?Past Medical History:  ?Diagnosis Date  ? Abscess   ? Right forearm healing  ? ADHD (attention deficit hyperactivity disorder)   ? Anal fissure   ? Anxiety   ? Arthritis   ? back, shoulders, ankles, knees  ? Back pain   ?  Bipolar disorder ([place])   ? Chest pain   ? Chronic lower back pain   ? Constipation   ? Depression   ? Esophagitis   ? Distal esophageal erosions consistent with mild erosive  reflux esophagitis 12/2008 by EGD   ? External hemorrhoids   ? Gastric polyps   ? Gastric ulcer   ? GERD (gastroesophageal reflux disease)   ? Hepatic steatosis   ? Hiatal hernia   ? History of stomach ulcers   ? Hx MRSA infection 06/2007  ? right thigh  ? Hx of colonic polyps   ? Hypercholesteremia   ? denies  ? Hypothyroidism   ? Internal hemorrhoids   ? Irritable bowel syndrome (IBS)   ? Obesity   ? Occult GI bleeding 12/2008  ? Trivial upper GI bleed/uncontrolled GERD by upper endoscopy 12/2008, normal f/u endoscopy 03/2009 with SBCE at that time  ? OSA on CPAP   ? Pain management   ? Pneumonia 09/01/2015  ? "on ATB still" (09/04/2015)  ? S/P colonoscopy 11/10, 10/08  ? Dr [name]  ? Schizophrenia [facility])   ? Sleep apnea   ? Stomach ulcer   ? Thyroid function test abnormal   ? Noted in 2011 discharge  ? Tobacco dipper   ? Wears contact lenses   ? ? ?Current Outpatient Medications:  ?  Calcium Carbonate (  CALCIUM 600 PO), Take 600 mg by mouth in the morning, at noon, and at bedtime., Disp: , Rfl:  ?  cetirizine (ZYRTEC) 10 MG tablet, Take 10 mg by mouth daily., Disp: , Rfl:  ?  cyclobenzaprine (FLEXERIL) 10 MG tablet, Take 1 tablet by mouth three times daily as needed for muscle spasm, Disp: 90 tablet, Rfl: 2 ?  docusate sodium (COLACE) 250 MG capsule, Take 250 mg by mouth daily., Disp: , Rfl:  ?  EPINEPHrine 0.3 mg/0.3 mL IJ SOAJ injection, Inject 0.3 mg into the muscle as needed for anaphylaxis., Disp: , Rfl:  ?  gabapentin (NEURONTIN) 300 MG capsule, TAKE 1 CAPSULE BY MOUTH THREE TIMES DAILY, Disp: 90 capsule, Rfl: 2 ?  lamoTRIgine (LAMICTAL) 25 MG tablet, Take 2 tablets by mouth twice daily, Disp: 120 tablet, Rfl: 5 ?  levothyroxine (SYNTHROID) 75 MCG tablet, TAKE 1 TABLET BY MOUTH ONCE DAILY BEFORE BREAKFAST, Disp: 90 tablet, Rfl: 2 ?   lisdexamfetamine (VYVANSE) 20 MG capsule, Take 1 capsule (20 mg total) by mouth daily., Disp: 30 capsule, Rfl: 0 ?  lisdexamfetamine (VYVANSE) 20 MG capsule, Take 1 capsule (20 mg total) by mouth daily., Disp: 30 capsule, Rfl: 0 ?  Multiple Vitamins-Minerals (BARIATRIC MULTIVITAMINS/IRON PO), Take 1 tablet by mouth in the morning and at bedtime., Disp: , Rfl:  ?  ondansetron (ZOFRAN-ODT) 4 MG disintegrating tablet, Dissolve 1 tablet (4 mg total) by mouth every 6 (six) hours as needed for nausea or vomiting., Disp: 20 tablet, Rfl: 0 ?  pantoprazole (PROTONIX) 40 MG tablet, Take 1 tablet (40 mg total) by mouth 2 (two) times daily. **Take twice a day for 1 month after surgery, then decrease to once daily., Disp: 180 tablet, Rfl: 0 ?  Vitamin D, Ergocalciferol, (DRISDOL) 1.25 MG (50000 UNIT) CAPS capsule, TAKE 1 CAPSULE BY MOUTH ONCE A WEEK EVERY  THURSDAY (Patient taking differently: Take 50,000 Units by mouth every Friday.), Disp: 12 capsule, Rfl: 4 ?Social History  ? ?Socioeconomic History  ? Marital status: Married  ?  Spouse name: Lawerance Bach  ? Number of children: 2  ? Years of education: Not on file  ? Highest education level: Not on file  ?Occupational History  ? Occupation: Firefighter  ?  Employer: [employer]  ? Occupation: truck Geophysicist/field seismologist  ?Tobacco Use  ? Smoking status: Never  ? Smokeless tobacco: Former  ?  Types: Snuff  ?  Quit date: 12/16/2020  ? Tobacco comments:  ?  quitting snuff. whinning off   ?Vaping Use  ? Vaping Use: Never used  ?Substance and Sexual Activity  ? Alcohol use: Not Currently  ? Drug use: No  ? Sexual activity: Not Currently  ?Other Topics Concern  ? Not on file  ?Social History Narrative  ? Not on file  ? ?Social Determinants of Health  ? ?Financial Resource Strain: Not on file  ?Food Insecurity: Not on file  ?Transportation Needs: Not on file  ?Physical Activity: Not on file  ?Stress: Not on file  ?Social Connections: Not on file  ?Intimate Partner Violence: Not on file  ? ?Family  History  ?Problem Relation Age of Onset  ? Leukemia Father 67  ? Colon polyps Father   ? Clotting disorder Father   ? Heart disease Father   ? Cancer Father   ? Depression Father   ? Sleep apnea Father   ? Obesity Father   ? Seizures Mother   ? Irritable bowel syndrome Mother   ? Thyroid disease Mother   ?  Depression Mother   ? Anxiety disorder Mother   ? Bipolar disorder Brother   ? ADD / ADHD Brother   ? Diabetes Paternal Grandmother   ? Ulcerative colitis Paternal Aunt   ? Colon cancer Neg Hx   ? Liver cancer Neg Hx   ? Esophageal cancer Neg Hx   ? Stomach cancer Neg Hx   ? Pancreatic cancer Neg Hx   ? ? ?Objective: ?Office vital signs reviewed. ?BP 119/72   Pulse 86   Temp 98.1 ?F (36.7 ?C)   Ht '5\' 9"'$  (1.753 m)   Wt (!) 303 lb 3.2 oz (137.5 kg)   SpO2 100%   BMI 44.77 kg/m?  ? ?Physical Examination:  ?General: Awake, alert, well nourished, obese.  No acute distress ?HEENT: Sclera white.  Moist mucous membranes ?Cardio: regular rate and rhythm  ?Pulm: Normal work of breathing on room air ?MSK: Normal gait and station ?Neuro: Cranial nerves II through XII grossly intact.  No focal neurologic deficits ? ?Assessment/ Plan: ?36 y.o. male  ? ?New daily persistent headache ? ?No focal neurologic deficits on exam.  He actually sounds like he is having some intermittent migraine headaches.  Uncertain etiology but does seem to have onset with work and initiation of Vyvanse.  He had not had any intolerance to amphetamines in the past and he has a totally normal blood pressure reading on today's exam.  Sounds like he is hydrating really well, getting adequate sleep.  Uncertain etiology of symptoms.  I am going to have him maintain a headache log and I have given him some samples of Nurtec to use if the headache becomes refractory to Tylenol use.  We have an appointment on 11 April for normal checkup and we will reconvene at that time.  If he has ongoing daily headache, we will plan for referral to neurology.  He  understands red flag signs and symptoms warranting further evaluation will follow-up as directed ? ?No orders of the defined types were placed in this encounter. ? ?Meds ordered this encounter  ?Medications  ? [place]

## 2022-03-05 ENCOUNTER — Encounter: Payer: Self-pay | Admitting: Family Medicine

## 2022-03-05 ENCOUNTER — Ambulatory Visit (INDEPENDENT_AMBULATORY_CARE_PROVIDER_SITE_OTHER): Payer: Medicaid Other | Admitting: Family Medicine

## 2022-03-05 VITALS — BP 122/71 | HR 74 | Temp 98.0°F | Ht 69.0 in | Wt 300.6 lb

## 2022-03-05 DIAGNOSIS — F902 Attention-deficit hyperactivity disorder, combined type: Secondary | ICD-10-CM | POA: Diagnosis not present

## 2022-03-05 DIAGNOSIS — E039 Hypothyroidism, unspecified: Secondary | ICD-10-CM

## 2022-03-05 DIAGNOSIS — G4452 New daily persistent headache (NDPH): Secondary | ICD-10-CM | POA: Diagnosis not present

## 2022-03-05 DIAGNOSIS — F3177 Bipolar disorder, in partial remission, most recent episode mixed: Secondary | ICD-10-CM

## 2022-03-05 MED ORDER — LISDEXAMFETAMINE DIMESYLATE 20 MG PO CAPS: 20.0000 mg | ORAL_CAPSULE | Freq: Every day | ORAL | 0 refills | Status: DC

## 2022-03-05 NOTE — Progress Notes (Signed)
? ?Subjective: ?CC: hypothyroidism ?PCP: [name], DO ?NKN:[name] is a 36 y.o. male presenting to clinic today for: ? ?1. Hypothyroidism ?No tremor, heart palpitations or change in bowel habits reported.  Compliant with Synthroid ? ?2. ADHD ?ADHD is stable with Vyvanse.  Continues to have an intermittent daily headache as below.  No reports of constipation or excessive dryness ? ?3. 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  He will be transitioning out of this truck in 1 week.  He used the Nurtec but he did not find it especially helpful.  No associated weakness, slurred speech, blurry vision or balance changes.  He did not have a headache when he was not working. ? ? ?ROS: Per HPI ? ?Allergies  ?Allergen Reactions  ? Ibuprofen Other (See Comments)  ?  Makes ulcers bleed  ? Influenza Vaccines Shortness Of Breath and Other (See Comments)  ?  Rash and unable to breathe well  ? Tylenol [Acetaminophen] Hives  ? Bee Venom Swelling  ?  SWELLING REACTION UNSPECIFIED   ? Tramadol Itching  ? ?Past Medical History:  ?Diagnosis Date  ? Abscess   ? Right forearm healing  ? ADHD (attention deficit hyperactivity disorder)   ? Anal fissure   ? Anxiety   ? Arthritis   ? back, shoulders, ankles, knees  ? Back pain   ? Bipolar disorder ([place])   ? Chest pain   ? Chronic lower back pain   ? Constipation   ? Depression   ? Esophagitis   ? Distal esophageal erosions consistent with mild erosive  reflux esophagitis 12/2008 by EGD   ? External hemorrhoids   ? Gastric polyps   ? Gastric ulcer   ? GERD (gastroesophageal reflux disease)   ? Hepatic steatosis   ? Hiatal hernia   ? History of stomach ulcers   ? Hx MRSA infection 06/2007  ? right thigh  ? Hx of colonic polyps   ? Hypercholesteremia   ? denies  ? Hypothyroidism   ? Internal hemorrhoids   ? Irritable bowel syndrome (IBS)   ? Obesity   ? Occult GI bleeding 12/2008  ? Trivial upper  GI bleed/uncontrolled GERD by upper endoscopy 12/2008, normal f/u endoscopy 03/2009 with SBCE at that time  ? OSA on CPAP   ? Pain management   ? Pneumonia 09/01/2015  ? "on ATB still" (09/04/2015)  ? S/P colonoscopy 11/10, 10/08  ? Dr [name]  ? Schizophrenia [facility])   ? Sleep apnea   ? Stomach ulcer   ? Thyroid function test abnormal   ? Noted in 2011 discharge  ? Tobacco dipper   ? Wears contact lenses   ? ? ?Current Outpatient Medications:  ?  Calcium Carbonate (CALCIUM 600 PO), Take 600 mg by mouth in the morning, at noon, and at bedtime., Disp: , Rfl:  ?  cetirizine (ZYRTEC) 10 MG tablet, Take 10 mg by mouth daily., Disp: , Rfl:  ?  cyclobenzaprine (FLEXERIL) 10 MG tablet, Take 1 tablet by mouth three times daily as needed for muscle spasm, Disp: 90 tablet, Rfl: 2 ?  docusate sodium (COLACE) 250 MG capsule, Take 250 mg by mouth daily., Disp: , Rfl:  ?  EPINEPHrine 0.3 mg/0.3 mL IJ SOAJ injection, Inject 0.3 mg into the muscle as needed for anaphylaxis., Disp: , Rfl:  ?  gabapentin (NEURONTIN) 300 MG capsule, TAKE 1 CAPSULE BY  MOUTH THREE TIMES DAILY, Disp: 90 capsule, Rfl: 2 ?  lamoTRIgine (LAMICTAL) 25 MG tablet, Take 2 tablets by mouth twice daily, Disp: 120 tablet, Rfl: 5 ?  levothyroxine (SYNTHROID) 75 MCG tablet, TAKE 1 TABLET BY MOUTH ONCE DAILY BEFORE BREAKFAST, Disp: 90 tablet, Rfl: 2 ?  lisdexamfetamine (VYVANSE) 20 MG capsule, Take 1 capsule (20 mg total) by mouth daily., Disp: 30 capsule, Rfl: 0 ?  lisdexamfetamine (VYVANSE) 20 MG capsule, Take 1 capsule (20 mg total) by mouth daily., Disp: 30 capsule, Rfl: 0 ?  Multiple Vitamins-Minerals (BARIATRIC MULTIVITAMINS/IRON PO), Take 1 tablet by mouth in the morning and at bedtime., Disp: , Rfl:  ?  ondansetron (ZOFRAN-ODT) 4 MG disintegrating tablet, Dissolve 1 tablet (4 mg total) by mouth every 6 (six) hours as needed for nausea or vomiting., Disp: 20 tablet, Rfl: 0 ?  pantoprazole (PROTONIX) 40 MG tablet, Take 1 tablet (40 mg total) by mouth 2 (two)  times daily. **Take twice a day for 1 month after surgery, then decrease to once daily., Disp: 180 tablet, Rfl: 0 ?  Rimegepant Sulfate (NURTEC) 75 MG TBDP, Take 1 tablet by mouth daily as needed (migraine)., Disp: 4 tablet, Rfl: 0 ?  Vitamin D, Ergocalciferol, (DRISDOL) 1.25 MG (50000 UNIT) CAPS capsule, TAKE 1 CAPSULE BY MOUTH ONCE A WEEK EVERY  THURSDAY (Patient taking differently: Take 50,000 Units by mouth every Friday.), Disp: 12 capsule, Rfl: 4 ?Social History  ? ?Socioeconomic History  ? Marital status: Married  ?  Spouse name: Lawerance Bach  ? Number of children: 2  ? Years of education: Not on file  ? Highest education level: Not on file  ?Occupational History  ? Occupation: Firefighter  ?  Employer: [employer]  ? Occupation: truck Geophysicist/field seismologist  ?Tobacco Use  ? Smoking status: Never  ? Smokeless tobacco: Former  ?  Types: Snuff  ?  Quit date: 12/16/2020  ? Tobacco comments:  ?  quitting snuff. whinning off   ?Vaping Use  ? Vaping Use: Never used  ?Substance and Sexual Activity  ? Alcohol use: Not Currently  ? Drug use: No  ? Sexual activity: Not Currently  ?Other Topics Concern  ? Not on file  ?Social History Narrative  ? Not on file  ? ?Social Determinants of Health  ? ?Financial Resource Strain: Not on file  ?Food Insecurity: Not on file  ?Transportation Needs: Not on file  ?Physical Activity: Not on file  ?Stress: Not on file  ?Social Connections: Not on file  ?Intimate Partner Violence: Not on file  ? ?Family History  ?Problem Relation Age of Onset  ? Leukemia Father 71  ? Colon polyps Father   ? Clotting disorder Father   ? Heart disease Father   ? Cancer Father   ? Depression Father   ? Sleep apnea Father   ? Obesity Father   ? Seizures Mother   ? Irritable bowel syndrome Mother   ? Thyroid disease Mother   ? Depression Mother   ? Anxiety disorder Mother   ? Bipolar disorder Brother   ? ADD / ADHD Brother   ? Diabetes Paternal Grandmother   ? Ulcerative colitis Paternal Aunt   ? Colon cancer Neg Hx   ?  Liver cancer Neg Hx   ? Esophageal cancer Neg Hx   ? Stomach cancer Neg Hx   ? Pancreatic cancer Neg Hx   ? ? ?Objective: ?Office vital signs reviewed. ?BP 122/71   Pulse 74   Temp 98 ?F (  36.7 ?C)   Ht '5\' 9"'  (1.753 m)   Wt (!) 300 lb 9.6 oz (136.4 kg)   SpO2 100%   BMI 44.39 kg/m?  ? ?Physical Examination:  ?General: Awake, alert, well nourished, No acute distress ?HEENT: sclera white, MMM ?Cardio: regular rate and rhythm, S1S2 heard, no murmurs appreciated ?Pulm: clear to auscultation bilaterally, no wheezes, rhonchi or rales; normal work of breathing on room air ?Extremities: warm, well perfused, No edema, cyanosis or clubbing; +2 pulses bilaterally ?Neuro: No focal neurologic deficits ?Psych: Mood stable, speech normal, affect appropriate.  Pleasant and interactive ? ? ?  03/05/2022  ?  8:14 AM 02/20/2022  ?  8:56 AM 12/03/2021  ?  8:13 AM  ?Depression screen PHQ 2/9  ?Decreased Interest 0 0 0  ?Down, Depressed, Hopeless 0 0 1  ?PHQ - 2 Score 0 0 1  ?Altered sleeping   0  ?Tired, decreased energy   0  ?Change in appetite   0  ?Feeling bad or failure about yourself    0  ?Trouble concentrating   0  ?Moving slowly or fidgety/restless   0  ?Suicidal thoughts   0  ?PHQ-9 Score   1  ?Difficult doing work/chores   Not difficult at all  ? ? ?  03/05/2022  ?  8:14 AM 02/20/2022  ?  8:57 AM 12/03/2021  ?  8:14 AM 09/24/2021  ?  8:42 AM  ?GAD 7 : Generalized Anxiety Score  ?Nervous, Anxious, on Edge '1 1 1 1  ' ?Control/stop worrying 0 0 0 0  ?Worry too much - different things 0 0 0 0  ?Trouble relaxing 0 0 0 0  ?Restless 0 1 0 0  ?Easily annoyed or irritable 0 0 1 1  ?Afraid - awful might happen 0 0 0 0  ?Total GAD 7 Score '1 2 2 2  ' ?Anxiety Difficulty Not difficult at all Not difficult at all Somewhat difficult Not difficult at all  ? ?Assessment/ Plan: ?36 y.o. male  ? ?Attention deficit hyperactivity disorder (ADHD), combined type - Plan: lisdexamfetamine (VYVANSE) 20 MG capsule, lisdexamfetamine (VYVANSE) 20 MG capsule,  lisdexamfetamine (VYVANSE) 20 MG capsule ? ?Acquired hypothyroidism - Plan: TSH, T4, Free, Lipid Panel ? ?Bipolar disorder, in partial remission, most recent episode mixed ([place]) - Plan: CMP14+EGFR, CBC ? ?Morbid

## 2022-03-06 LAB — TSH: TSH: 2.48 u[IU]/mL (ref 0.450–4.500)

## 2022-03-06 LAB — CMP14+EGFR
ALT: 12 IU/L (ref 0–44)
AST: 17 IU/L (ref 0–40)
Albumin/Globulin Ratio: 2.9 — ABNORMAL HIGH (ref 1.2–2.2)
Albumin: 4.6 g/dL (ref 4.0–5.0)
Alkaline Phosphatase: 92 IU/L (ref 44–121)
BUN/Creatinine Ratio: 11 (ref 9–20)
BUN: 10 mg/dL (ref 6–20)
Bilirubin Total: 0.5 mg/dL (ref 0.0–1.2)
CO2: 28 mmol/L (ref 20–29)
Calcium: 9.8 mg/dL (ref 8.7–10.2)
Chloride: 101 mmol/L (ref 96–106)
Creatinine, Ser: 0.89 mg/dL (ref 0.76–1.27)
Globulin, Total: 1.6 g/dL (ref 1.5–4.5)
Glucose: 78 mg/dL (ref 70–99)
Potassium: 4.6 mmol/L (ref 3.5–5.2)
Sodium: 140 mmol/L (ref 134–144)
Total Protein: 6.2 g/dL (ref 6.0–8.5)
eGFR: 114 mL/min/{1.73_m2} (ref >59)

## 2022-03-06 LAB — T4, FREE: Free T4: 1.19 ng/dL (ref 0.82–1.77)

## 2022-03-06 LAB — CBC
Hematocrit: 49.1 % (ref 37.5–51.0)
Hemoglobin: 15.9 g/dL (ref 13.0–17.7)
MCH: 28.6 pg (ref 26.6–33.0)
MCHC: 32.4 g/dL (ref 31.5–35.7)
MCV: 89 fL (ref 79–97)
Platelets: 255 10*3/uL (ref 150–450)
RBC: 5.55 x10E6/uL (ref 4.14–5.80)
RDW: 13.1 % (ref 11.6–15.4)
WBC: 6.6 10*3/uL (ref 3.4–10.8)

## 2022-03-06 LAB — LIPID PANEL
Chol/HDL Ratio: 2.8 ratio (ref 0.0–5.0)
Cholesterol, Total: 138 mg/dL (ref 100–199)
HDL: 50 mg/dL (ref >39)
LDL Chol Calc (NIH): 72 mg/dL (ref 0–99)
Triglycerides: 81 mg/dL (ref 0–149)
VLDL Cholesterol Cal: 16 mg/dL (ref 5–40)

## 2022-04-02 ENCOUNTER — Other Ambulatory Visit: Payer: Self-pay | Admitting: Family Medicine

## 2022-04-02 DIAGNOSIS — M5126 Other intervertebral disc displacement, lumbar region: Secondary | ICD-10-CM

## 2022-04-08 ENCOUNTER — Other Ambulatory Visit: Payer: Self-pay | Admitting: Family Medicine

## 2022-04-08 DIAGNOSIS — M5126 Other intervertebral disc displacement, lumbar region: Secondary | ICD-10-CM

## 2022-04-22 ENCOUNTER — Emergency Department (HOSPITAL_BASED_OUTPATIENT_CLINIC_OR_DEPARTMENT_OTHER): Payer: Medicaid Other

## 2022-04-22 ENCOUNTER — Other Ambulatory Visit: Payer: Self-pay

## 2022-04-22 ENCOUNTER — Encounter (HOSPITAL_BASED_OUTPATIENT_CLINIC_OR_DEPARTMENT_OTHER): Payer: Self-pay

## 2022-04-22 ENCOUNTER — Emergency Department (HOSPITAL_BASED_OUTPATIENT_CLINIC_OR_DEPARTMENT_OTHER)
Admission: EM | Admit: 2022-04-22 | Discharge: 2022-04-22 | Disposition: A | Discharge status: Home / Self Care | Payer: Medicaid Other | Attending: Emergency Medicine | Admitting: Emergency Medicine

## 2022-04-22 DIAGNOSIS — R109 Unspecified abdominal pain: Secondary | ICD-10-CM | POA: Diagnosis present

## 2022-04-22 DIAGNOSIS — N451 Epididymitis: Secondary | ICD-10-CM | POA: Insufficient documentation

## 2022-04-22 LAB — CBC WITH DIFFERENTIAL/PLATELET
Abs Immature Granulocytes: 0.01 10*3/uL (ref 0.00–0.07)
Basophils Absolute: 0 10*3/uL (ref 0.0–0.1)
Basophils Relative: 1 %
Eosinophils Absolute: 0.1 10*3/uL (ref 0.0–0.5)
Eosinophils Relative: 2 %
HCT: 47.2 % (ref 39.0–52.0)
Hemoglobin: 15.7 g/dL (ref 13.0–17.0)
Immature Granulocytes: 0 %
Lymphocytes Relative: 36 %
Lymphs Abs: 2.5 10*3/uL (ref 0.7–4.0)
MCH: 29.8 pg (ref 26.0–34.0)
MCHC: 33.3 g/dL (ref 30.0–36.0)
MCV: 89.6 fL (ref 80.0–100.0)
Monocytes Absolute: 0.5 10*3/uL (ref 0.1–1.0)
Monocytes Relative: 7 %
Neutro Abs: 3.8 10*3/uL (ref 1.7–7.7)
Neutrophils Relative %: 54 %
Platelets: 243 10*3/uL (ref 150–400)
RBC: 5.27 MIL/uL (ref 4.22–5.81)
RDW: 13.8 % (ref 11.5–15.5)
WBC: 7 10*3/uL (ref 4.0–10.5)
nRBC: 0 % (ref 0.0–0.2)

## 2022-04-22 LAB — URINALYSIS, ROUTINE W REFLEX MICROSCOPIC
Bilirubin Urine: NEGATIVE
Glucose, UA: NEGATIVE mg/dL
Hgb urine dipstick: NEGATIVE
Ketones, ur: NEGATIVE mg/dL
Leukocytes,Ua: NEGATIVE
Nitrite: NEGATIVE
Protein, ur: NEGATIVE mg/dL
Specific Gravity, Urine: 1.014 (ref 1.005–1.030)
pH: 7 (ref 5.0–8.0)

## 2022-04-22 LAB — COMPREHENSIVE METABOLIC PANEL
ALT: 14 U/L (ref 0–44)
AST: 17 U/L (ref 15–41)
Albumin: 4.5 g/dL (ref 3.5–5.0)
Alkaline Phosphatase: 72 U/L (ref 38–126)
Anion gap: 6 (ref 5–15)
BUN: 12 mg/dL (ref 6–20)
CO2: 31 mmol/L (ref 22–32)
Calcium: 9.6 mg/dL (ref 8.9–10.3)
Chloride: 103 mmol/L (ref 98–111)
Creatinine, Ser: 0.95 mg/dL (ref 0.61–1.24)
GFR, Estimated: >60 mL/min (ref >60)
Glucose, Bld: 90 mg/dL (ref 70–99)
Potassium: 4.4 mmol/L (ref 3.5–5.1)
Sodium: 140 mmol/L (ref 135–145)
Total Bilirubin: 0.9 mg/dL (ref 0.3–1.2)
Total Protein: 6.8 g/dL (ref 6.5–8.1)

## 2022-04-22 MED ORDER — CIPROFLOXACIN HCL 500 MG PO TABS
500.0000 mg | ORAL_TABLET | Freq: Once | ORAL | Status: AC
  Administered 2022-04-22: 500 mg via ORAL
  Filled 2022-04-22: qty 1

## 2022-04-22 MED ORDER — FENTANYL CITRATE PF 50 MCG/ML IJ SOSY
50.0000 ug | PREFILLED_SYRINGE | Freq: Once | INTRAMUSCULAR | Status: AC
  Administered 2022-04-22: 50 ug via INTRAVENOUS
  Filled 2022-04-22: qty 1

## 2022-04-22 MED ORDER — HYDROMORPHONE HCL 1 MG/ML IJ SOLN
1.0000 mg | Freq: Once | INTRAMUSCULAR | Status: AC
  Administered 2022-04-22: 1 mg via INTRAVENOUS
  Filled 2022-04-22: qty 1

## 2022-04-22 MED ORDER — ONDANSETRON HCL 4 MG/2ML IJ SOLN
4.0000 mg | Freq: Once | INTRAMUSCULAR | Status: AC
  Administered 2022-04-22: 4 mg via INTRAVENOUS
  Filled 2022-04-22: qty 2

## 2022-04-22 MED ORDER — CIPROFLOXACIN HCL 500 MG PO TABS: 500.0000 mg | ORAL_TABLET | Freq: Two times a day (BID) | ORAL | 0 refills | Status: AC

## 2022-04-22 NOTE — ED Triage Notes (Signed)
Pt, states pain in groin  on left side started yesterday. Pt. St ates no injury; woke up with the pain. Pain 7/10 States pain radiates up the left side of body. States feels like someone has left testicle in a clamp.

## 2022-04-22 NOTE — ED Provider Notes (Signed)
[facility] DEPT Provider Note   CSN: [id number] Arrival date & time: 04/22/22  7829     History  Chief Complaint  Patient presents with   Groin Pain    [name] is a 36 y.o. male.  With past medical history of obesity, GERD, testicular torsion who presents to the emergency department with testicular pain.  Patient states that pain began yesterday.  He states that it was a sudden onset that woke him up from sleep.  He states it feels like his left testicle is "in a grip."  He states that the pain radiates from his left testicle up into his left lower abdomen and side.  He has had nausea without vomiting as well as urinary frequency.  He denies any fevers, testicular swelling, scrotal swelling, penile discharge, dysuria, hematuria, decreased urine output.  He denies any diarrhea or previous abdominal surgeries.  He states that he did have testicular torsion about 14 years ago and from his recollection, it sounds like it was manually detorsed. He does not recall having orchiopexy after this.    Groin Pain Associated symptoms include abdominal pain.      Home Medications Prior to Admission medications   Medication Sig Start Date End Date Taking? Authorizing Provider  Calcium Carbonate (CALCIUM 600 PO) Take 600 mg by mouth in the morning, at noon, and at bedtime.    [provider]  cetirizine (ZYRTEC) 10 MG tablet Take 10 mg by mouth daily.    [provider]  cyclobenzaprine (FLEXERIL) 10 MG tablet Take 1 tablet by mouth three times daily as needed for muscle spasm 04/03/22   Ronnie Doss M, DO  docusate sodium (COLACE) 250 MG capsule Take 250 mg by mouth daily.    [provider]  EPINEPHrine 0.3 mg/0.3 mL IJ SOAJ injection Inject 0.3 mg into the muscle as needed for anaphylaxis.    [provider]  gabapentin (NEURONTIN) 300 MG capsule TAKE 1 CAPSULE BY MOUTH THREE TIMES DAILY 04/08/22   Ronnie Doss M, DO   lamoTRIgine (LAMICTAL) 25 MG tablet Take 2 tablets by mouth twice daily 10/31/21   Ronnie Doss M, DO  levothyroxine (SYNTHROID) 75 MCG tablet TAKE 1 TABLET BY MOUTH ONCE DAILY BEFORE BREAKFAST 02/15/22   Ronnie Doss M, DO  lisdexamfetamine (VYVANSE) 20 MG capsule Take 1 capsule (20 mg total) by mouth daily. 04/06/22   [name], DO  lisdexamfetamine (VYVANSE) 20 MG capsule Take 1 capsule (20 mg total) by mouth daily. 03/07/22   [name], DO  lisdexamfetamine (VYVANSE) 20 MG capsule Take 1 capsule (20 mg total) by mouth daily. 05/06/22   [name], DO  Multiple Vitamins-Minerals (BARIATRIC MULTIVITAMINS/IRON PO) Take 1 tablet by mouth in the morning and at bedtime.    [provider]  ondansetron (ZOFRAN-ODT) 4 MG disintegrating tablet Dissolve 1 tablet (4 mg total) by mouth every 6 (six) hours as needed for nausea or vomiting. 06/06/21   Clovis Riley, MD  pantoprazole (PROTONIX) 40 MG tablet Take 1 tablet (40 mg total) by mouth 2 (two) times daily. **Take twice a day for 1 month after surgery, then decrease to once daily. 06/06/21   Clovis Riley, MD  Rimegepant Sulfate (NURTEC) 75 MG TBDP Take 1 tablet by mouth daily as needed (migraine). 02/20/22   [name], DO  Vitamin D, Ergocalciferol, (DRISDOL) 1.25 MG (50000 UNIT) CAPS capsule TAKE 1 CAPSULE BY MOUTH ONCE A WEEK EVERY  THURSDAY Patient  taking differently: Take 50,000 Units by mouth every Friday. 09/24/21   [name], DO      Allergies    Ibuprofen, Influenza vaccines, Tylenol [acetaminophen], Bee venom, and Tramadol    Review of Systems   Review of Systems  Gastrointestinal:  Positive for abdominal pain and nausea. Negative for vomiting.  Genitourinary:  Positive for frequency and testicular pain. Negative for decreased urine volume, difficulty urinating, flank pain, hematuria, penile discharge, penile pain, penile swelling and scrotal swelling.   Physical  Exam Updated Vital Signs BP 123/78 (BP Location: Right Arm)   Pulse 77   Temp (!) 97.5 F (36.4 C) (Oral)   Resp 18   Ht '5\' 10"'$  (1.778 m)   Wt (!) 136.4 kg   SpO2 100%   BMI 43.15 kg/m  Physical Exam Vitals and nursing note reviewed. Exam conducted with a chaperone present.  Constitutional:      General: He is not in acute distress.    Appearance: Normal appearance. He is ill-appearing. He is not toxic-appearing.  HENT:     Head: Normocephalic and atraumatic.  Eyes:     General: No scleral icterus.    Extraocular Movements: Extraocular movements intact.  Cardiovascular:     Pulses: Normal pulses.  Pulmonary:     Effort: Pulmonary effort is normal. No respiratory distress.  Abdominal:     General: Bowel sounds are normal. There is no distension.     Palpations: Abdomen is soft.     Tenderness: There is abdominal tenderness. There is no right CVA tenderness or left CVA tenderness.     Hernia: There is no hernia in the left inguinal area or right inguinal area.  Genitourinary:    Penis: Normal.      Testes:        Left: Tenderness present. Mass, swelling, testicular hydrocele or varicocele not present. Left testis is descended.     Epididymis:     Right: Normal.     Left: Normal.     Comments: High riding left testicle  Musculoskeletal:        General: Normal range of motion.  Skin:    General: Skin is warm and dry.     Capillary Refill: Capillary refill takes less than 2 seconds.  Neurological:     General: No focal deficit present.     Mental Status: He is alert and oriented to person, place, and time. Mental status is at baseline.  Psychiatric:        Mood and Affect: Mood normal.        Behavior: Behavior normal.        Thought Content: Thought content normal.        Judgment: Judgment normal.    ED Results / Procedures / Treatments   Labs (all labs ordered are listed, but only abnormal results are displayed) Labs Reviewed  COMPREHENSIVE METABOLIC PANEL   CBC WITH DIFFERENTIAL/PLATELET  URINALYSIS, ROUTINE W REFLEX MICROSCOPIC   EKG None  Radiology CT Renal Stone Study  Result Date: 04/22/2022 CLINICAL DATA:  Left groin pain starting yesterday. Woke up this morning with pain. EXAM: CT ABDOMEN AND PELVIS WITHOUT CONTRAST TECHNIQUE: Multidetector CT imaging of the abdomen and pelvis was performed following the standard protocol without IV contrast. RADIATION DOSE REDUCTION: This exam was performed according to the departmental dose-optimization program which includes automated exposure control, adjustment of the mA and/or kV according to patient size and/or use of iterative reconstruction technique. COMPARISON:  06/13/2021. FINDINGS: Lower chest:  Clear lung bases. Hepatobiliary: Superior most aspect of the liver excluded from the field of view. Visualized liver is normal. Normal gallbladder. No bile duct dilation. Pancreas: Unremarkable. No pancreatic ductal dilatation or surrounding inflammatory changes. Spleen: Normal in size without focal abnormality. Adrenals/Urinary Tract: Normal adrenal glands. Kidneys normal size, orientation and position. Low-density left renal mass, medial midpole, 3.4 cm, consistent with a cyst and stable. No other masses, no stones and no hydronephrosis. Normal ureters normal bladder. Stomach/Bowel: Previous gastric bypass surgery. No stomach mass, wall thickening or inflammation. Small bowel and colon are normal in caliber. No wall thickening. No inflammation. Vascular/Lymphatic: No significant vascular findings are present. No enlarged abdominal or pelvic lymph nodes. Reproductive: Unremarkable. Other: No abdominal wall hernia or abnormality. No abdominopelvic ascites. Musculoskeletal: No fracture or acute finding.  No bone lesion. IMPRESSION: 1. No acute findings. No findings to account for flank pain or groin pain. No renal or ureteral stones or obstructive uropathy. Electronically Signed   By: Lajean Manes M.D.   On:  04/22/2022 12:36   US SCROTUM W/DOPPLER  Result Date: 04/22/2022 CLINICAL DATA:  Left testicular pain for 1 day No known injury EXAM: SCROTAL ULTRASOUND DOPPLER ULTRASOUND OF THE TESTICLES TECHNIQUE: Complete ultrasound examination of the testicles, epididymis, and other scrotal structures was performed. Color and spectral Doppler ultrasound were also utilized to evaluate blood flow to the testicles. COMPARISON:  07/29/2017 FINDINGS: Right testicle Measurements: 4.9 x 2.5 x 3.3 cm. No mass or microlithiasis visualized. Left testicle Measurements: 4.0 x 2.7 x 3.4 cm. No mass or microlithiasis visualized. Right epididymis:  Normal in size and appearance. Left epididymis:  Normal in size and appearance. Hydrocele:  Small left hydrocele. Varicocele:  None visualized. Pulsed Doppler interrogation of both testes demonstrates normal low resistance arterial and venous waveforms bilaterally. IMPRESSION: 1. No acute abnormality of the testes. 2. Small left hydrocele. Electronically Signed   By: Miachel Roux M.D.   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  He denies any preceding trauma.  He denies fevers or chills.  On exam he does not have a discolored scrotum.   He does have tenderness diffusely of the left testicle, no significant edema, epididymal tenderness noted as well.  I do not appreciate any abscess of the scrotum.  Labs and UA are normal and unremarkable.  He is pending a testicular torsion versus epididymitis ultrasound [MT]    Clinical Course User Index [MT] Trifan, Carola Rhine, MD                           Medical Decision Making Amount and/or Complexity of Data Reviewed Labs: ordered. Radiology: ordered.  Risk Prescription drug management.  This patient presents to the ED for concern of testicular  pain, this involves an extensive number of treatment options, and is a complaint that carries with it a high risk of complications and morbidity.  The differential diagnosis includes torsion, orchitis, epididymitis, UTI, renal stone, varicocele, hydrocele, STI, etc.   Co morbidities that complicate the patient evaluation Obesity   Additional history obtained:  Additional history obtained from: wife at bedside  External records from outside source obtained and reviewed including: none   EKG: Not indicated  Cardiac Monitoring: Not indicated  Lab Results: I personally ordered, reviewed, and interpreted labs. Pertinent results include: CBC within normal limits CMP within normal limits  UA negative  Imaging Studies ordered:  I ordered imaging studies which included ultrasound and CT.  I independently reviewed & interpreted imaging & am in agreement with radiology impression. Imaging shows: [place] torsion study negative. Small left hydrocele CT renal stone study negative   Medications  I ordered medication including zofran for nausea, fentanyl and dilaudid for pain. Cipro for epididymitis Reevaluation of the patient after medication shows that patient improved -I reviewed the patient's home medications and did not make adjustments. -I did  prescribe new home medications.  Tests Considered: No further testing indicated at this  time  Critical Interventions: None required  Consultations: None required   SDH None identified   ED Course:  36 year old male who presents to the emergency department with left sided testicular pain beginning yesterday. Physical exam is notable for TTP of the left testicle and epididymis without obvious swelling, redness. Testicle appears slightly higher ridding but cremasteric reflex is intact.  Labs unremarkable. [place] study is negative for torsion or other findings. He does have a small left hydrocele which is likely reactive. Discussed results with patient and participated in shared decision making regarding CT renal stone study. He does have urinary frequency and LLQ abdominal pain radiating from the testicles. Would like to rule out stone.He agrees. Ct renal stone study negative. In the interim he was given zofran, opioid pain relief with improvement in symptoms.  Still feel that he clinically has sign of epididymitis. He is quite tender to palpation and has small hydrocele which may be from infection brewing. He has no fever or leukocytosis. Low suspicion for systemic infection.   Dr. Langston Masker also evaluated the patient and agrees with workup. We will proceed to treat him for presumed epididymitis with Ciprofloxacin x10 days. Will give first dose here. Given return precautions for worsening symptoms, swelling and redness of the testicle, fevers. He verbalizes understanding   After consideration of the diagnostic results and the patients response to treatment, I feel that the patent would benefit from discharge. The patient has been appropriately medically screened and/or stabilized in the ED. I have low suspicion for any other emergent medical condition which would require further screening, evaluation or treatment in the ED or require inpatient management. The patient is overall well appearing and non-toxic in appearance. They are hemodynamically stable at time of discharge.   Final  Clinical Impression(s) / ED Diagnoses Final diagnoses:  Epididymitis, left    Rx / DC Orders ED Discharge Orders          Ordered    ciprofloxacin (CIPRO) 500 MG tablet  Every 12 hours        04/22/22 1324              Mickie Hillier, PA-C 04/22/22 1339    Wyvonnia Dusky, MD 04/22/22 [phone]

## 2022-04-22 NOTE — Discharge Instructions (Signed)
You were seen in the emergency department today for testicular pain.  This may be epididymitis.  We are placing you on antibiotics they will take twice a day over the next 10 days.  Please return to the emergency department for any significantly worsening swelling or fevers despite antibiotic use.  You may use Tylenol or Motrin as needed for pain relief.

## 2022-04-23 ENCOUNTER — Telehealth: Payer: Self-pay

## 2022-04-23 NOTE — Telephone Encounter (Signed)
Transition Care Management Unsuccessful Follow-up Telephone Call  Date of discharge and from where:  04/22/2022-[facility]  Attempts:  1st Attempt  Reason for unsuccessful TCM follow-up call:  Left voice message

## 2022-04-24 ENCOUNTER — Encounter (HOSPITAL_BASED_OUTPATIENT_CLINIC_OR_DEPARTMENT_OTHER): Payer: Self-pay

## 2022-04-24 ENCOUNTER — Other Ambulatory Visit: Payer: Self-pay

## 2022-04-24 ENCOUNTER — Emergency Department (HOSPITAL_BASED_OUTPATIENT_CLINIC_OR_DEPARTMENT_OTHER): Payer: Medicaid Other

## 2022-04-24 ENCOUNTER — Emergency Department (HOSPITAL_BASED_OUTPATIENT_CLINIC_OR_DEPARTMENT_OTHER)
Admission: EM | Admit: 2022-04-24 | Discharge: 2022-04-24 | Disposition: A | Discharge status: Home / Self Care | Payer: Medicaid Other | Attending: Emergency Medicine | Admitting: Emergency Medicine

## 2022-04-24 DIAGNOSIS — R110 Nausea: Secondary | ICD-10-CM | POA: Diagnosis not present

## 2022-04-24 DIAGNOSIS — N433 Hydrocele, unspecified: Secondary | ICD-10-CM | POA: Insufficient documentation

## 2022-04-24 DIAGNOSIS — N281 Cyst of kidney, acquired: Secondary | ICD-10-CM | POA: Insufficient documentation

## 2022-04-24 DIAGNOSIS — M47816 Spondylosis without myelopathy or radiculopathy, lumbar region: Secondary | ICD-10-CM | POA: Diagnosis not present

## 2022-04-24 DIAGNOSIS — N50812 Left testicular pain: Secondary | ICD-10-CM | POA: Diagnosis present

## 2022-04-24 MED ORDER — OXYCODONE HCL 5 MG PO TABS: 5.0000 mg | ORAL_TABLET | Freq: Four times a day (QID) | ORAL | 0 refills | Status: DC | PRN

## 2022-04-24 NOTE — ED Provider Notes (Signed)
[facility] Provider Note   CSN: [id number] Arrival date & time: 04/24/22  1706     History  Chief Complaint  Patient presents with   Testicle Pain   Follow-up    [name] is a 36 y.o. male.   Testicle Pain Pertinent negatives include no chest pain, no abdominal pain, no headaches and no shortness of breath.   36 year old male presents emergency department with complaints of left testicle pain.  He was just seen in the emergency department 2 days ago for the same presentation with a work-up including scrotal ultrasound showing mild left hydrocele and negative CT renal stone study.  He was diagnosed with left-sided epididymitis and given antibiotic of ciprofloxacin.  Patient states that pain has gotten increasingly worse with compliance of antibiotics. He has tried no other medical therapy besides antibiotics. He describes the pain as constant in nature radiating from left lower back into left testicle. Associated symptoms include nausea. He has not followed up with urology. Denies fever, chills, night sweats, chest pain, shortness of breath, abdominal pain, V/D, urinar symptoms, change in bowel habits.  PMH significant for testicular torsion of left testicle 2009, anxiety, bipolar disorder, chronic low back pain, GERD, hepatid steatosis, obesity, schizophrenia   Home Medications Prior to Admission medications   Medication Sig Start Date End Date Taking? Authorizing Provider  oxyCODONE (ROXICODONE) 5 MG immediate release tablet Take 1 tablet (5 mg total) by mouth every 6 (six) hours as needed for breakthrough pain. 04/24/22  Yes Dion Saucier A, PA  Calcium Carbonate (CALCIUM 600 PO) Take 600 mg by mouth in the morning, at noon, and at bedtime.    [provider]  cetirizine (ZYRTEC) 10 MG tablet Take 10 mg by mouth daily.    [provider]  ciprofloxacin (CIPRO) 500 MG tablet Take 1 tablet (500 mg total) by mouth every 12  (twelve) hours for 10 days. 04/22/22 05/02/22  Mickie Hillier, PA-C  cyclobenzaprine (FLEXERIL) 10 MG tablet Take 1 tablet by mouth three times daily as needed for muscle spasm 04/03/22   Ronnie Doss M, DO  docusate sodium (COLACE) 250 MG capsule Take 250 mg by mouth daily.    [provider]  EPINEPHrine 0.3 mg/0.3 mL IJ SOAJ injection Inject 0.3 mg into the muscle as needed for anaphylaxis.    [provider]  gabapentin (NEURONTIN) 300 MG capsule TAKE 1 CAPSULE BY MOUTH THREE TIMES DAILY 04/08/22   Ronnie Doss M, DO  lamoTRIgine (LAMICTAL) 25 MG tablet Take 2 tablets by mouth twice daily 10/31/21   Ronnie Doss M, DO  levothyroxine (SYNTHROID) 75 MCG tablet TAKE 1 TABLET BY MOUTH ONCE DAILY BEFORE BREAKFAST 02/15/22   Ronnie Doss M, DO  lisdexamfetamine (VYVANSE) 20 MG capsule Take 1 capsule (20 mg total) by mouth daily. 04/06/22   [name], DO  lisdexamfetamine (VYVANSE) 20 MG capsule Take 1 capsule (20 mg total) by mouth daily. 03/07/22   [name], DO  lisdexamfetamine (VYVANSE) 20 MG capsule Take 1 capsule (20 mg total) by mouth daily. 05/06/22   [name], DO  Multiple Vitamins-Minerals (BARIATRIC MULTIVITAMINS/IRON PO) Take 1 tablet by mouth in the morning and at bedtime.    [provider]  ondansetron (ZOFRAN-ODT) 4 MG disintegrating tablet Dissolve 1 tablet (4 mg total) by mouth every 6 (six) hours as needed for nausea or vomiting. 06/06/21   Clovis Riley, MD  pantoprazole (PROTONIX) 40 MG tablet Take 1 tablet (  40 mg total) by mouth 2 (two) times daily. **Take twice a day for 1 month after surgery, then decrease to once daily. 06/06/21   Clovis Riley, MD  Rimegepant Sulfate (NURTEC) 75 MG TBDP Take 1 tablet by mouth daily as needed (migraine). 02/20/22   [name], DO  Vitamin D, Ergocalciferol, (DRISDOL) 1.25 MG (50000 UNIT) CAPS capsule TAKE 1 CAPSULE BY MOUTH ONCE A WEEK EVERY  THURSDAY Patient  taking differently: Take 50,000 Units by mouth every Friday. 09/24/21   [name], DO      Allergies    Ibuprofen, Influenza vaccines, Tylenol [acetaminophen], Bee venom, and Tramadol    Review of Systems   Review of Systems  Constitutional:  Negative for chills and fever.  HENT:  Negative for congestion, postnasal drip, rhinorrhea, sinus pressure, sinus pain and sore throat.   Eyes:  Negative for pain and discharge.  Respiratory:  Negative for cough, shortness of breath and wheezing.   Cardiovascular:  Negative for chest pain, palpitations and leg swelling.  Gastrointestinal:  Negative for abdominal pain, blood in stool, diarrhea, nausea and vomiting.  Endocrine: Negative for polyuria.  Genitourinary:  Positive for flank pain and testicular pain. Negative for decreased urine volume, dysuria, frequency and urgency.  Musculoskeletal:  Negative for neck pain and neck stiffness.  Skin:  Negative for rash.  Neurological:  Negative for dizziness, seizures, syncope, weakness, light-headedness, numbness and headaches.  All other systems reviewed and are negative.  Physical Exam Updated Vital Signs BP 117/77 (BP Location: Left Arm)   Pulse 86   Temp 97.9 F (36.6 C) (Oral)   Resp 18   Ht '5\' 10"'$  (1.778 m)   Wt 133.8 kg   SpO2 100%   BMI 42.33 kg/m  Physical Exam Vitals and nursing note reviewed.  Constitutional:      General: He is not in acute distress.    Appearance: Normal appearance. He is not ill-appearing, toxic-appearing or diaphoretic.  HENT:     Head: Normocephalic and atraumatic.     Nose: Nose normal.     Mouth/Throat:     Mouth: Mucous membranes are moist.     Pharynx: Oropharynx is clear. No oropharyngeal exudate or posterior oropharyngeal erythema.  Eyes:     General:        Right eye: No discharge.        Left eye: No discharge.     Extraocular Movements: Extraocular movements intact.     Conjunctiva/sclera: Conjunctivae normal.  Cardiovascular:      Rate and Rhythm: Normal rate and regular rhythm. Extrasystoles are present.    Pulses: Normal pulses.     Heart sounds: Normal heart sounds.  Pulmonary:     Effort: Pulmonary effort is normal. No respiratory distress.     Breath sounds: Normal breath sounds. No wheezing or rales.  Abdominal:     General: Abdomen is flat. Bowel sounds are normal.     Palpations: Abdomen is soft.     Tenderness: There is no abdominal tenderness. There is no right CVA tenderness, left CVA tenderness or guarding.  Musculoskeletal:        General: No swelling. Normal range of motion.     Cervical back: Normal range of motion and neck supple. No rigidity or tenderness.     Right lower leg: No edema.     Left lower leg: No edema.  Skin:    General: Skin is warm and dry.     Capillary Refill: Capillary  refill takes less than 2 seconds.  Neurological:     General: No focal deficit present.     Mental Status: He is alert and oriented to person, place, and time.  Psychiatric:        Mood and Affect: Mood normal.        Behavior: Behavior normal.    ED Results / Procedures / Treatments   Labs (all labs ordered are listed, but only abnormal results are displayed) Labs Reviewed - No data to display  EKG None  Radiology CT Renal Stone Study  Result Date: 04/24/2022 CLINICAL DATA:  Left scrotal pain, left flank pain EXAM: CT ABDOMEN AND PELVIS WITHOUT CONTRAST TECHNIQUE: Multidetector CT imaging of the abdomen and pelvis was performed following the standard protocol without IV contrast. RADIATION DOSE REDUCTION: This exam was performed according to the departmental dose-optimization program which includes automated exposure control, adjustment of the mA and/or kV according to patient size and/or use of iterative reconstruction technique. COMPARISON:  04/22/2022 FINDINGS: Lower chest: Unremarkable. Hepatobiliary: No focal abnormality is seen in the liver. There is no dilation of bile ducts. Gallbladder is  unremarkable. Pancreas: No focal abnormality is seen. Spleen: Unremarkable. Adrenals/Urinary Tract: Adrenals are unremarkable. There is no hydronephrosis. There are no renal or ureteral stones. There is 2.8 cm smooth marginated low-density lesion in the lower pole of left kidney suggesting renal cyst. There are no calcific densities in the courses of the ureters. Urinary bladder is unremarkable. Stomach/Bowel: Surgical staples are seen in the stomach and jejunum suggesting previous bariatric surgery. There is no significant small bowel dilation. Cecum is noted in the subhepatic position. Appendix is not visualized. There is no pericecal inflammation. There is no significant wall thickening in colon. There is no pericolic stranding. Vascular/Lymphatic: Unremarkable. Reproductive: Unremarkable. Other: There is no ascites or pneumoperitoneum. Small umbilical hernia containing fat is seen. Small left inguinal hernia containing fat is seen. Musculoskeletal: Degenerative changes are noted with encroachment of neural foramina at L4-L5 and L5-S1 levels. IMPRESSION: There is no evidence of intestinal obstruction or pneumoperitoneum. There is no hydronephrosis. There are no renal or ureteral stones. 2.8 cm left renal cyst. Lumbar spondylosis with encroachment of neural foramina at L4-L5 and L5-S1 levels. Other findings as described in the body of the report. Electronically Signed   By: Elmer Picker M.D.   On: 04/24/2022 18:29   US SCROTUM W/DOPPLER  Result Date: 04/24/2022 CLINICAL DATA:  LEFT testicular pain, history of vasectomy. EXAM: SCROTAL ULTRASOUND DOPPLER ULTRASOUND OF THE TESTICLES TECHNIQUE: Complete ultrasound examination of the testicles, epididymis, and other scrotal structures was performed. Color and spectral Doppler ultrasound were also utilized to evaluate blood flow to the testicles. COMPARISON:  Apr 22, 2022. FINDINGS: Right testicle Measurements: 4.6 x 2.5 x 3.3 cm. No mass or microlithiasis  visualized. Left testicle Measurements: 5.0 x 2.4 x 3.3 cm. No mass or microlithiasis visualized. Right epididymis:  Normal in size and appearance. Left epididymis: Normal aside from very small cyst in the epididymal tail. Hydrocele:  Small LEFT hydrocele. Varicocele:  None visualized. Pulsed Doppler interrogation of both testes demonstrates normal low resistance arterial and venous waveforms bilaterally. IMPRESSION: Small LEFT hydrocele. No acute findings or sonographic evidence of torsion. Electronically Signed   By: Zetta Bills M.D.   On: 04/24/2022 19:01    Procedures Procedures    Medications Ordered in ED Medications - No data to display  ED Course/ Medical Decision Making/ A&P Clinical Course as of 04/25/22 1212  Thu Apr 25, 2022  1204 US SCROTUM W/DOPPLER [CR]    Clinical Course User Index [CR] Wilnette Kales, PA                           Medical Decision Making Amount and/or Complexity of Data Reviewed Radiology: ordered.  Risk Prescription drug management.   This patient presents to the ED for concern of testicular pain, this involves an extensive number of treatment options, and is a complaint that carries with it a high risk of complications and morbidity.  The differential diagnosis includes epididymitis, hydrocele, varicocele, inguinal hernia, testicular torsion, nephrolithiasis, abscess..   Co morbidities that complicate the patient evaluation   testicular torsion of left testicle 2009, anxiety, bipolar disorder, chronic low back pain, GERD, hepatid steatosis, obesity, schizophrenia   Additional history obtained:  Additional history obtained from Scrotal US from 04/21/22 External records from outside source obtained and reviewed including Negative beside small left sided hydrocele   Lab Tests:  I Ordered, and personally interpreted labs.  The pertinent results include:  n/a   Imaging Studies ordered:  I ordered imaging studies including scrotal US  and CT renal stone  Scrotal US: small left sided cyst on epididymal tail and small left hydrocele CT renal stone:  2.8cm left renal cyst. Lumbar spondylosis with encroachment of neural foramina at L4-L5 and L5-S1. No acute abnormalitis I independently visualized and interpreted imaging which showed no acute I agree with the radiologist interpretation  Cardiac Monitoring: / EKG:  The patient was maintained on a cardiac monitor.  I personally viewed and interpreted the cardiac monitored which showed an underlying rhythm of: sinus rhythm   Consultations Obtained:  N/a   Problem List / ED Course / Critical interventions / Medication management  Testicle pain Reevaluation of the patient showed that the patient stayed the same I have reviewed the patients home medicines and have made adjustments as needed   Social Determinants of Health:  Chronic tobacco dip. Denies illicit drug use   Test / Admission - Considered:  Scrotal pain Vitals signs within normal range and stable throughout visit Imaging studies significant for: Stable left sided hydrocele as most likely source of pain Patient made aware that hydrocele can cause significant amount of pain. Due to patient unable to take ibuprofen and minimal tylenol due to chronic health conditions, pain medicine was prescribed to take as needed until f/u with urology. Patient recommended close f/u with urology for further management of symptoms.  Worrisome signs and symptoms were discussed with the patient, and the patient acknowledged understanding to return to the ED if noticed. Patient was stable upon discharge.           Final Clinical Impression(s) / ED Diagnoses Final diagnoses:  Hydrocele in adult    Rx / DC Orders ED Discharge Orders          Ordered    oxyCODONE (ROXICODONE) 5 MG immediate release tablet  Every 6 hours PRN        04/24/22 1936              Wilnette Kales, [place] 04/25/22 2536    Campbell Stall P, DO 64/40/34 1412

## 2022-04-24 NOTE — Discharge Instructions (Addendum)
Please get in contact with [facility] as quickly as possible for further evaluation and treatment of your symptoms. You have evidence of left sided hydrocele on ultrasound.  I will prescribe pain medicine to take as needed for breakthrough pain.  He can take over-the-counter Tylenol as directed by your PCP with knowledge of your fatty liver disease.  Please do not hesitate to return to the emergency department for worrisome signs and symptoms we discussed become apparent.

## 2022-04-24 NOTE — Telephone Encounter (Signed)
Transition Care Management Follow-up Telephone Call Date of discharge and from where: 04/22/2022 from [facility] How have you been since you were released from the hospital? Patient stated that he is still in some pain. Patient has started abx rx'ed by ED. Patient stated he is taking it bid as rx'ed.  Any questions or concerns? No  Items Reviewed: Did the pt receive and understand the discharge instructions provided? Yes  Medications obtained and verified? Yes  Other? No  Any new allergies since your discharge? No  Dietary orders reviewed? No Do you have support at home? Yes   Functional Questionnaire: (I = Independent and D = Dependent) ADLs: I  Bathing/Dressing- I  Meal Prep- I  Eating- I  Maintaining continence- I  Transferring/Ambulation- I  Managing Meds- I   Follow up appointments reviewed:  PCP Hospital f/u appt confirmed? No  Patient will call PCP for a sooner appt.  [facility] f/u appt confirmed? No   Are transportation arrangements needed? No  If their condition worsens, is the pt aware to call PCP or go to the Emergency Dept.? Yes Was the patient provided with contact information for the PCP's office or ED? Yes Was to pt encouraged to call back with questions or concerns? Yes

## 2022-04-24 NOTE — ED Triage Notes (Signed)
Pt c/o increasing pain to the L testicle. Pt was seen at [facility] and was dx with epididymitis and was started on Cipro. Pt reports the pain is getting worse.

## 2022-04-25 ENCOUNTER — Telehealth: Payer: Self-pay

## 2022-04-25 NOTE — Telephone Encounter (Signed)
Transition Care Management Follow-up Telephone Call Date of discharge and from where: 04/24/2022-[facility] How have you been since you were released from the hospital? Pt stated he is still having pain. Pt just started his pain medication this morning.  Any questions or concerns? No  Items Reviewed: Did the pt receive and understand the discharge instructions provided? Yes  Medications obtained and verified? Yes  Other? No  Any new allergies since your discharge? No  Dietary orders reviewed? No Do you have support at home? Yes   Home Care and Equipment/Supplies: Were home health services ordered? not applicable If so, what is the name of the agency? N/A  Has the agency set up a time to come to the patient's home? not applicable Were any new equipment or medical supplies ordered?  No What is the name of the medical supply agency? N/A Were you able to get the supplies/equipment? not applicable Do you have any questions related to the use of the equipment or supplies? No  Functional Questionnaire: (I = Independent and D = Dependent) ADLs: I  Bathing/Dressing- I  Meal Prep- I  Eating- I  Maintaining continence- I  Transferring/Ambulation- I  Managing Meds- I  Follow up appointments reviewed:  PCP Hospital f/u appt confirmed? No   [facility] f/u appt confirmed? Yes  Scheduled to see Urology on 04/26/2022. Are transportation arrangements needed? No  If their condition worsens, is the pt aware to call PCP or go to the Emergency Dept.? Yes Was the patient provided with contact information for the PCP's office or ED? Yes Was to pt encouraged to call back with questions or concerns? Yes

## 2022-04-26 ENCOUNTER — Encounter: Payer: Self-pay | Admitting: Urology

## 2022-04-26 ENCOUNTER — Ambulatory Visit (INDEPENDENT_AMBULATORY_CARE_PROVIDER_SITE_OTHER): Payer: Medicaid Other | Admitting: Urology

## 2022-04-26 VITALS — BP 108/71 | HR 85

## 2022-04-26 DIAGNOSIS — N5082 Scrotal pain: Secondary | ICD-10-CM | POA: Diagnosis not present

## 2022-04-26 DIAGNOSIS — N433 Hydrocele, unspecified: Secondary | ICD-10-CM | POA: Diagnosis not present

## 2022-04-26 HISTORY — DX: Scrotal pain: N50.82

## 2022-04-26 LAB — URINALYSIS, ROUTINE W REFLEX MICROSCOPIC
Bilirubin, UA: NEGATIVE
Glucose, UA: NEGATIVE
Ketones, UA: NEGATIVE
Leukocytes,UA: NEGATIVE
Nitrite, UA: NEGATIVE
Protein,UA: NEGATIVE
RBC, UA: NEGATIVE
Specific Gravity, UA: >1.03 — ABNORMAL HIGH (ref 1.005–1.030)
Urobilinogen, Ur: 0.2 mg/dL (ref 0.2–1.0)
pH, UA: 6.5 (ref 5.0–7.5)

## 2022-04-26 NOTE — Progress Notes (Signed)
Assessment: 1. Scrotal pain   2. Hydrocele, unspecified hydrocele type     Plan: I reviewed the patient's records from his ER visits on 5/29 and 5/31. I personally reviewed the CT and ultrasound results from 5/29 and 5/31. His exam is consistent with epididymitis.  Diagnosis of epididymitis and management discussed with the patient. I advised him that the hydrocele is very small and would not recommend any treatment this is not a likely cause for his symptoms. Complete Cipro. Unfortunately, he is unable to tolerate anti-inflammatory medication. Advised patient to contact my office if his symptoms are not improved after completing the antibiotics. Return to office in 4 weeks  Chief Complaint:  Chief Complaint  Patient presents with   Testicle Pain    History of Present Illness:  [name] is a 36 y.o. year old male who is seen in consultation from Ronnie Doss M, DO for evaluation of left scrotal pain.  He presented to the emergency room on 04/22/2022 with acute onset of left scrotal pain.  The symptoms started approximately 1 day before.  He noted pain in the left scrotum with radiation into the left flank and back area.  No scrotal trauma.  He noted some swelling of the left scrotum. Scrotal ultrasound from 04/22/2022 demonstrated normal testes bilaterally, normal blood flow bilaterally, and a small left hydrocele.  CT abdomen and pelvis showed no renal or ureteral calculi and a stable left renal cyst.  He was started on Cipro x 10 days for possible epididymitis.  He returned to the emergency room on 04/24/2022 with increased left scrotal pain.  A repeat scrotal ultrasound from 04/24/2022 again showed normal testes bilaterally with normal blood flow, small left hydrocele.  Repeat CT scan was unchanged. He continues on Cipro.  His scrotal pain has improved slightly. He has symptoms of frequency, and nocturia.  No dysuria or gross hematuria.  He had a prior history of  scrotal pain which was diagnosed as possible torsion a number of years ago.  He is also status post a vasectomy approximately 15 years ago.  Past Medical History:  Past Medical History:  Diagnosis Date   Abscess    Right forearm healing   ADHD (attention deficit hyperactivity disorder)    Anal fissure    Anxiety    Arthritis    back, shoulders, ankles, knees   Back pain    Bipolar disorder (HCC)    Chest pain    Chronic lower back pain    Constipation    Depression    Esophagitis    Distal esophageal erosions consistent with mild erosive  reflux esophagitis 12/2008 by EGD    External hemorrhoids    Gastric polyps    Gastric ulcer    GERD (gastroesophageal reflux disease)    Hepatic steatosis    Hiatal hernia    History of stomach ulcers    Hx MRSA infection 06/2007   right thigh   Hx of colonic polyps    Hypercholesteremia    denies   Hypothyroidism    Internal hemorrhoids    Irritable bowel syndrome (IBS)    Obesity    Occult GI bleeding 12/2008   Trivial upper GI bleed/uncontrolled GERD by upper endoscopy 12/2008, normal f/u endoscopy 03/2009 with SBCE at that time   OSA on CPAP    Pain management    Pneumonia 09/01/2015   "on ATB still" (09/04/2015)   S/P colonoscopy 11/10, 10/08   Dr [name]   Schizophrenia ([place])  Sleep apnea    Stomach ulcer    Thyroid function test abnormal    Noted in 2011 discharge   Tobacco dipper    Wears contact lenses     Past Surgical History:  Past Surgical History:  Procedure Laterality Date   ANKLE FRACTURE SURGERY Left ~ 2008   BACK SURGERY     BIOPSY  11/02/2019   Procedure: BIOPSY;  Surgeon: Lavena Bullion, DO;  Location: [facility];  Service: Gastroenterology;;  EGD and Colon   BIOPSY  06/07/2020   Procedure: BIOPSY;  Surgeon: Lavena Bullion, DO;  Location: [facility];  Service: Gastroenterology;;   BRAVO [place] STUDY N/A 06/07/2020   Procedure: BRAVO [place] on PPI;  Surgeon: Lavena Bullion, DO;   Location: [facility];  Service: Gastroenterology;  Laterality: N/A;   COLONOSCOPY  10/10/2009   anal papilla otherwise normal   COLONOSCOPY WITH PROPOFOL N/A 11/02/2019   Procedure: COLONOSCOPY WITH PROPOFOL;  Surgeon: Lavena Bullion, DO;  Location: [facility];  Service: Gastroenterology;  Laterality: N/A;   ESOPHAGOGASTRODUODENOSCOPY  04/07/2009   Normal esophagus, small hiatal hernia   ESOPHAGOGASTRODUODENOSCOPY  01/09/2009   Distal esophageal erosions consistent with mild erosive reflux esophagitis, otherwise normal esophagus, small hiatal herniaotherwise normal stomach, D1-D2    ESOPHAGOGASTRODUODENOSCOPY  08/26/2007   Normal esophagus, a small hiatal/hernia, otherwise normal stomach D1 through D3   ESOPHAGOGASTRODUODENOSCOPY (EGD) WITH PROPOFOL N/A 11/02/2019   Procedure: ESOPHAGOGASTRODUODENOSCOPY (EGD) WITH PROPOFOL;  Surgeon: Lavena Bullion, DO;  Location: [facility];  Service: Gastroenterology;  Laterality: N/A;   ESOPHAGOGASTRODUODENOSCOPY (EGD) WITH PROPOFOL N/A 06/07/2020   Procedure: ESOPHAGOGASTRODUODENOSCOPY (EGD) WITH PROPOFOL;  Surgeon: Lavena Bullion, DO;  Location: [facility];  Service: Gastroenterology;  Laterality: N/A;   FRACTURE SURGERY     GASTRIC BYPASS     GASTRIC ROUX-EN-Y N/A 06/05/2021   Procedure: LAPAROSCOPIC ROUX-EN-Y GASTRIC BYPASS WITH UPPER ENDOSCOPY;  Surgeon: Clovis Riley, MD;  Location: [facility];  Service: General;  Laterality: N/A;   ileocolonoscopy  08/26/2007    A normal rectum, colon, and terminal ileum   INCISION AND DRAINAGE  09/04/2015   "reopened my back incsion"   LUMBAR LAMINECTOMY/DECOMPRESSION MICRODISCECTOMY Left 09/23/2017   Procedure: Microdiscectomy - left - Lumbar four-Lumbar five;  Surgeon: Earnie Larsson, MD;  Location: [facility];  Service: Neurosurgery;  Laterality: Left;   LUMBAR MICRODISCECTOMY  08/21/2015   LUMBAR MICRODISCECTOMY Left 09/23/2017   L4-5   LUMBAR WOUND DEBRIDEMENT N/A 09/04/2015   Procedure:  LUMBAR WOUND DEBRIDEMENT;  Surgeon: Consuella Lose, MD;  Location: [facility];  Service: Neurosurgery;  Laterality: N/A;   right side sugery     enlarged lymph node gland removed under arm pit age 68-5 years   SHOULDER ARTHROSCOPY WITH DISTAL CLAVICLE RESECTION Right 11/22/2021   Procedure: SHOULDER ARTHROSCOPY WITH SUBACROMIAL DECOMPRESSION AND DISTAL CLAVICLE EXCISION;  Surgeon: Mordecai Rasmussen, MD;  Location: [facility];  Service: Orthopedics;  Laterality: Right;   Small bowel capsule  04/11/2009    normal throughout   VASECTOMY      Allergies:  Allergies  Allergen Reactions   Ibuprofen Other (See Comments)    Makes ulcers bleed   Influenza Vaccines Shortness Of Breath and Other (See Comments)    Rash and unable to breathe well   Tylenol [Acetaminophen] Hives   Bee Venom Swelling    SWELLING REACTION UNSPECIFIED    Tramadol Itching    Family History:  Family History  Problem Relation Age of Onset  Leukemia Father 42   Colon polyps Father    Clotting disorder Father    Heart disease Father    Cancer Father    Depression Father    Sleep apnea Father    Obesity Father    Seizures Mother    Irritable bowel syndrome Mother    Thyroid disease Mother    Depression Mother    Anxiety disorder Mother    Bipolar disorder Brother    ADD / ADHD Brother    Diabetes Paternal Grandmother    Ulcerative colitis Paternal Aunt    Colon cancer Neg Hx    Liver cancer Neg Hx    Esophageal cancer Neg Hx    Stomach cancer Neg Hx    Pancreatic cancer Neg Hx     Social History:  Social History   Tobacco Use   Smoking status: Never   Smokeless tobacco: Former    Types: Snuff    Quit date: 12/16/2020   Tobacco comments:    quitting snuff. whinning off   Vaping Use   Vaping Use: Never used  Substance Use Topics   Alcohol use: Not Currently   Drug use: No    Review of symptoms:  Constitutional:  Negative for unexplained weight loss, night sweats, fever, chills ENT:  Negative  for nose bleeds, sinus pain, painful swallowing CV:  Negative for chest pain, shortness of breath, exercise intolerance, palpitations, loss of consciousness Resp:  Negative for cough, wheezing, shortness of breath GI:  Negative for nausea, vomiting, diarrhea, bloody stools GU:  Positives noted in HPI; otherwise negative for gross hematuria, dysuria, urinary incontinence Neuro:  Negative for seizures, poor balance, limb weakness, slurred speech Psych:  Negative for lack of energy, depression, anxiety Endocrine:  Negative for polydipsia, polyuria, symptoms of hypoglycemia (dizziness, hunger, sweating) Hematologic:  Negative for anemia, purpura, petechia, prolonged or excessive bleeding, use of anticoagulants  Allergic:  Negative for difficulty breathing or choking as a result of exposure to anything; no shellfish allergy; no allergic response (rash/itch) to materials, foods  Physical exam: BP 108/71   Pulse 85  GENERAL APPEARANCE:  Well appearing, well developed, well nourished, NAD HEENT: Atraumatic, Normocephalic, oropharynx clear. NECK: Supple without lymphadenopathy or thyromegaly. LUNGS: Clear to auscultation bilaterally. HEART: Regular Rate and Rhythm without murmurs, gallops, or rubs. ABDOMEN: Soft, non-tender, No Masses. EXTREMITIES: Moves all extremities well.  Without clubbing, cyanosis, or edema. NEUROLOGIC:  Alert and oriented x 3, normal gait, CN II-XII grossly intact.  MENTAL STATUS:  Appropriate. BACK:  Non-tender to palpation.  No CVAT SKIN:  Warm, dry and intact.   GU:  Penis:  uncircumcised Meatus: Normal Scrotum: no erythema or edema, no significant hydrocele noted Testis: normal without masses bilateral Epididymis: tender to palpation on left   Results: U/A: dipstick negative

## 2022-05-06 ENCOUNTER — Encounter: Payer: Self-pay | Admitting: Skilled Nursing Facility1

## 2022-05-06 ENCOUNTER — Encounter: Payer: Medicaid Other | Attending: Surgery | Admitting: Skilled Nursing Facility1

## 2022-05-06 DIAGNOSIS — E669 Obesity, unspecified: Secondary | ICD-10-CM | POA: Diagnosis present

## 2022-05-06 NOTE — Progress Notes (Signed)
Bariatric Nutrition Follow-Up Visit Medical Nutrition Therapy   Post-Operative RYGB Surgery   NUTRITION ASSESSMENT    Surgery date: 06/05/2021 Surgery type: RYGB Start weight at [facility]: 398 pounds Weight today: 297 pounds  Clinical  Medical hx: schizophrenia, reflux Medications: reduced protonix  Labs: WNL Notable signs/symptoms:  Any previous deficiencies? Vitamin D   Body Composition Scale 07/31/2021 10/17/2021 01/23/2022 05/06/2022  Current Body Weight 337.2 314.3 297 292.2  Total Body Fat % 39.9 38 36.5 35.4  Visceral Fat 33 '30 27 26  '$ Fat-Free Mass % 60 61.9 63.4 64.5   Total Body Water % 41 42.9 44.4 45.5  Muscle-Mass lbs 63.7 59.3 56 55.1  BMI 49.6 46.2 43.6 42  Body Fat Displacement             Torso  lbs 83.6 74.3 67.3 64.3         Left Leg  lbs 16.7 14.8 13.4 12.8         Right Leg  lbs 16.7 14.8 13.4 12.8         Left Arm  lbs 8.3 7.4 6.7 6.4         Right Arm   lbs 8.3 7.4 6.7 6.4     Lifestyle & Dietary Hx  Pt states he feels this past year has gone really well and is very proud of himself. Pt states he is about to go to 7 days a week while they work on [address].  Pt state he was able to stay off sugary beverages by drinking the crystal light packets.  Pt states his energy is great with no need for naps.  Pt states he looks up bariatric recipes.  Pt states he is trying to be 260 pounds.  Pt states his wife and daughter are very supportive and continue to help him stick to his changes.   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    24-Hr Dietary Recall: has non starchy vegetables 2 times a day 7 days a week First Meal: 2 boiled eggs Snack 8:30-9am: nuts and seeds or cheese cubes + wheat thins Second Meal 12:30-1: ham and cheese slider (on tortilla not bun) + broccoli and caulifer with lite ranch or  salad + oil and vinegar or pickled eggs or pickled sausages + orange peanut butter crackers  Snack 3: nuts or trail mix or beef jerky or protein bar Third Meal: pork chop or chicken or steak or shrimp + beans + broccoli or peas or green beans sometimes mashed potatoes Snack:  Beverages: (60-70oz) water, water + flavoring, zero sugar gatorade, zero sugar dr. Malachi Bonds, 8 oucnes tomato juice  Post-Op Goals/ Signs/ Symptoms Using straws: no Drinking while eating: no Chewing/swallowing difficulties: no Changes in vision: no Changes to mood/headaches: no Hair loss/changes to skin/nails: no Difficulty focusing/concentrating: no Sweating: no Dizziness/lightheadedness: no Palpitations: no  Carbonated/caffeinated beverages: no N/V/D/C/Gas: no Abdominal pain: no Dumping syndrome: no    NUTRITION DIAGNOSIS  Overweight/obesity ([place]-3.3) related to past poor dietary habits and physical inactivity as evidenced by completed bariatric surgery and following dietary guidelines for continued weight loss and healthy nutrition status.     NUTRITION INTERVENTION Nutrition counseling (C-1) and education (E-2) to facilitate bariatric surgery goals, including: The importance of consuming adequate calories as well as certain nutrients daily due to the body's need for essential vitamins, minerals, and fats The importance  of daily physical activity and to reach a goal of at least 150 minutes of moderate to vigorous physical activity weekly (or as directed by their physician) due to benefits such as increased musculature and improved lab values The importance of intuitive eating specifically learning hunger-satiety cues and understanding the importance of learning a new body: The importance of mindful eating to avoid grazing behaviors  Encouraged patient to honor their body's internal hunger and fullness cues.  Throughout the day, check in mentally and rate hunger. Stop eating when satisfied not full regardless of  how much food is left on the plate.  Get more if still hungry 20-30 minutes later.  The key is to honor satisfaction so throughout the meal, rate fullness factor and stop when comfortably satisfied not physically full. The key is to honor hunger and fullness without any feelings of guilt or shame.  Pay attention to what the internal cues are, rather than any external factors. This will enhance the confidence you have in listening to your own body and following those internal cues enabling you to increase how often you eat when you are hungry not out of appetite and stop when you are satisfied not full.  Encouraged pt to continue to eat balanced meals inclusive of non starchy vegetables 2 times a day 7 days a week Encouraged pt to choose lean protein sources: limiting beef, pork, sausage, hotdogs, and lunch meat Encourage pt to choose healthy fats such as plant based limiting animal fats Encouraged pt to continue to drink a minium 64 fluid ounces with half being plain water to satisfy proper hydration    Handouts Provided Include  Phase 7  Learning Style & Readiness for Change Teaching method utilized: Visual & Auditory  Demonstrated degree of understanding via: Teach Back  Readiness Level: Action Barriers to learning/adherence to lifestyle change: none identified    MONITORING & EVALUATION Dietary intake, weekly physical activity, body weight  Next Steps Patient is to follow-up please call or e-mail with any questions or concerns as they come up, we will always e here to help support you; please make an appt as you feel you need assistance

## 2022-05-12 ENCOUNTER — Other Ambulatory Visit: Payer: Self-pay | Admitting: Family Medicine

## 2022-05-12 DIAGNOSIS — F3177 Bipolar disorder, in partial remission, most recent episode mixed: Secondary | ICD-10-CM

## 2022-05-12 DIAGNOSIS — M5126 Other intervertebral disc displacement, lumbar region: Secondary | ICD-10-CM

## 2022-05-23 ENCOUNTER — Ambulatory Visit: Payer: Medicaid Other | Admitting: Urology

## 2022-06-10 ENCOUNTER — Ambulatory Visit (INDEPENDENT_AMBULATORY_CARE_PROVIDER_SITE_OTHER): Payer: 59 | Admitting: Family Medicine

## 2022-06-10 ENCOUNTER — Encounter: Payer: Self-pay | Admitting: Family Medicine

## 2022-06-10 ENCOUNTER — Telehealth: Payer: Self-pay | Admitting: Family Medicine

## 2022-06-10 VITALS — BP 115/70 | HR 96 | Temp 97.6°F | Ht 70.0 in | Wt 296.8 lb

## 2022-06-10 DIAGNOSIS — F902 Attention-deficit hyperactivity disorder, combined type: Secondary | ICD-10-CM | POA: Diagnosis not present

## 2022-06-10 DIAGNOSIS — R69 Illness, unspecified: Secondary | ICD-10-CM | POA: Diagnosis not present

## 2022-06-10 MED ORDER — LISDEXAMFETAMINE DIMESYLATE 30 MG PO CAPS: 30.0000 mg | ORAL_CAPSULE | Freq: Every day | ORAL | 0 refills | Status: DC

## 2022-06-10 NOTE — Progress Notes (Signed)
Subjective: CC: ADHD PCP: [name], DO [id number] [name] is a 36 y.o. male presenting to clinic today for:  1.  ADHD Patient is accompanied by his spouse today.  He notes that his attention has not been as well-controlled.  This is definitely reiterated by his wife today who also feels like his mood has been a little worse.  He is compliant with his Lamictal.  He takes Vyvanse 20 mg daily.  Denies any excessive dryness.  No heart palpitations, tremor, insomnia reported.  ROS: Per HPI  Allergies  Allergen Reactions   Ibuprofen Other (See Comments)    Makes ulcers bleed   Influenza Vaccines Shortness Of Breath and Other (See Comments)    Rash and unable to breathe well   Tylenol [Acetaminophen] Hives   Bee Venom Swelling    SWELLING REACTION UNSPECIFIED    Tramadol Itching   Past Medical History:  Diagnosis Date   Abscess    Right forearm healing   ADHD (attention deficit hyperactivity disorder)    Anal fissure    Anxiety    Arthritis    back, shoulders, ankles, knees   Back pain    Bipolar disorder ([place])    Blood in stool 04/06/2009   Centricity Description: HEMATOCHEZIA Qualifier: Diagnosis of  By: Westly Pam  Centricity Description: MELENA Qualifier: Diagnosis of  By: Westly Pam    Chest pain    Chronic lower back pain    Constipation    Depression    Esophagitis    Distal esophageal erosions consistent with mild erosive  reflux esophagitis 12/2008 by EGD    External hemorrhoids    Gastric polyps    Gastric ulcer    GERD (gastroesophageal reflux disease)    Hepatic steatosis    Hiatal hernia    History of stomach ulcers    Hx MRSA infection 06/2007   right thigh   Hx of colonic polyps    Hypercholesteremia    denies   Hypothyroidism    Internal hemorrhoids    Irritable bowel syndrome (IBS)    Lower abdominal pain 03/07/2009   Qualifier: Diagnosis of  By: Craige Cotta     Obesity    Occult GI bleeding 12/2008   Trivial  upper GI bleed/uncontrolled GERD by upper endoscopy 12/2008, normal f/u endoscopy 03/2009 with SBCE at that time   OSA on CPAP    Pain management    Pneumonia 09/01/2015   "on ATB still" (09/04/2015)   S/P colonoscopy 11/10, 10/08   Dr [name]   Schizophrenia [facility])    Scrotal pain 04/26/2022   Sleep apnea    Stomach ulcer    Syncope and collapse 05/03/2012   Thyroid function test abnormal    Noted in 2011 discharge   Tobacco dipper    Wears contact lenses     Current Outpatient Medications:    Calcium Carbonate (CALCIUM 600 PO), Take 600 mg by mouth in the morning, at noon, and at bedtime., Disp: , Rfl:    cetirizine (ZYRTEC) 10 MG tablet, Take 10 mg by mouth daily., Disp: , Rfl:    cyclobenzaprine (FLEXERIL) 10 MG tablet, Take 1 tablet by mouth three times daily as needed for muscle spasm, Disp: 90 tablet, Rfl: 6   docusate sodium (COLACE) 250 MG capsule, Take 250 mg by mouth daily., Disp: , Rfl:    EPINEPHrine 0.3 mg/0.3 mL IJ SOAJ injection, Inject 0.3 mg into the muscle as needed for anaphylaxis., Disp: ,  Rfl:    gabapentin (NEURONTIN) 300 MG capsule, TAKE 1 CAPSULE BY MOUTH THREE TIMES DAILY, Disp: 90 capsule, Rfl: 0   lamoTRIgine (LAMICTAL) 25 MG tablet, Take 2 tablets by mouth twice daily, Disp: 120 tablet, Rfl: 0   levothyroxine (SYNTHROID) 75 MCG tablet, TAKE 1 TABLET BY MOUTH ONCE DAILY BEFORE BREAKFAST, Disp: 90 tablet, Rfl: 2   Multiple Vitamins-Minerals (BARIATRIC MULTIVITAMINS/IRON PO), Take 1 tablet by mouth in the morning and at bedtime., Disp: , Rfl:    ondansetron (ZOFRAN-ODT) 4 MG disintegrating tablet, Dissolve 1 tablet (4 mg total) by mouth every 6 (six) hours as needed for nausea or vomiting., Disp: 20 tablet, Rfl: 0   pantoprazole (PROTONIX) 40 MG tablet, Take 1 tablet (40 mg total) by mouth 2 (two) times daily. **Take twice a day for 1 month after surgery, then decrease to once daily., Disp: 180 tablet, Rfl: 0   Rimegepant Sulfate (NURTEC) 75 MG TBDP, Take 1  tablet by mouth daily as needed (migraine)., Disp: 4 tablet, Rfl: 0   Vitamin D, Ergocalciferol, (DRISDOL) 1.25 MG (50000 UNIT) CAPS capsule, TAKE 1 CAPSULE BY MOUTH ONCE A WEEK EVERY  THURSDAY (Patient taking differently: Take 50,000 Units by mouth every Friday.), Disp: 12 capsule, Rfl: 4   lisdexamfetamine (VYVANSE) 30 MG capsule, Take 1 capsule (30 mg total) by mouth daily., Disp: 30 capsule, Rfl: 0   [START ON 08/09/2022] lisdexamfetamine (VYVANSE) 30 MG capsule, Take 1 capsule (30 mg total) by mouth daily., Disp: 30 capsule, Rfl: 0   [START ON 07/10/2022] lisdexamfetamine (VYVANSE) 30 MG capsule, Take 1 capsule (30 mg total) by mouth daily., Disp: 30 capsule, Rfl: 0 Social History   Socioeconomic History   Marital status: Married    Spouse name: Tabitha   Number of children: 2   Years of education: Not on file   Highest education level: Not on file  Occupational History   Occupation: Firefighter    Employer: [employer]   Occupation: truck Geophysicist/field seismologist  Tobacco Use   Smoking status: Never   Smokeless tobacco: Former    Types: Snuff    Quit date: 12/16/2020   Tobacco comments:    quitting snuff. whinning off   Vaping Use   Vaping Use: Never used  Substance and Sexual Activity   Alcohol use: Not Currently   Drug use: No   Sexual activity: Not Currently  Other Topics Concern   Not on file  Social History Narrative   Not on file   Social Determinants of Health   Financial Resource Strain: Not on file  Food Insecurity: Not on file  Transportation Needs: Not on file  Physical Activity: Not on file  Stress: Not on file  Social Connections: Not on file  Intimate Partner Violence: Not on file   Family History  Problem Relation Age of Onset   Leukemia Father 47   Colon polyps Father    Clotting disorder Father    Heart disease Father    Cancer Father    Depression Father    Sleep apnea Father    Obesity Father    Seizures Mother    Irritable bowel syndrome Mother     Thyroid disease Mother    Depression Mother    Anxiety disorder Mother    Bipolar disorder Brother    ADD / ADHD Brother    Diabetes Paternal Grandmother    Ulcerative colitis Paternal Aunt    Colon cancer Neg Hx    Liver cancer Neg Hx  Esophageal cancer Neg Hx    Stomach cancer Neg Hx    Pancreatic cancer Neg Hx     Objective: Office vital signs reviewed. BP 115/70   Pulse 96   Temp 97.6 F (36.4 C)   Ht '5\' 10"'$  (1.778 m)   Wt 296 lb 12.8 oz (134.6 kg)   SpO2 100%   BMI 42.59 kg/m   Physical Examination:  General: Awake, alert, obese, No acute distress HEENT: Sclera white.  Moist mucous membranes Cardio: regular rate and rhythm, S1S2 heard, no murmurs appreciated Pulm: clear to auscultation bilaterally, no wheezes, rhonchi or rales; Psych: Mood stable, speech normal     06/10/2022    8:11 AM 03/05/2022    8:14 AM 02/20/2022    8:56 AM  Depression screen PHQ 2/9  Decreased Interest 0 0 0  Down, Depressed, Hopeless 0 0 0  PHQ - 2 Score 0 0 0      06/10/2022    8:11 AM 03/05/2022    8:14 AM 02/20/2022    8:57 AM 12/03/2021    8:14 AM  GAD 7 : Generalized Anxiety Score  Nervous, Anxious, on Edge 0 '1 1 1  '$ Control/stop worrying 0 0 0 0  Worry too much - different things 0 0 0 0  Trouble relaxing 0 0 0 0  Restless 0 0 1 0  Easily annoyed or irritable 0 0 0 1  Afraid - awful might happen 0 0 0 0  Total GAD 7 Score 0 '1 2 2  '$ Anxiety Difficulty Not difficult at all Not difficult at all Not difficult at all Somewhat difficult      Assessment/ Plan: 36 y.o. male   Attention deficit hyperactivity disorder (ADHD), combined type - Plan: lisdexamfetamine (VYVANSE) 30 MG capsule, lisdexamfetamine (VYVANSE) 30 MG capsule, lisdexamfetamine (VYVANSE) 30 MG capsule, ToxASSURE Select 13 (MW), Urine  ADHD is not well controlled.  Advanced dose at 30 mg daily.  Up-to-date on UDS and CSC but we will obtain a random UDS today.  National narcotic database reviewed and there were no  red flags.  Meds have been sent x3 months.  Follow-up in 3 months, sooner if concerns arise  Orders Placed This Encounter  Procedures   ToxASSURE Select 13 (MW), Urine    Current Outpatient Medications:    Calcium Carbonate (CALCIUM 600 PO), Take 600 mg by mouth in the morning, at noon, and at bedtime., Disp: , Rfl:    cetirizine (ZYRTEC) 10 MG tablet, Take 10 mg by mouth daily., Disp: , Rfl:    cyclobenzaprine (FLEXERIL) 10 MG tablet, Take 1 tablet by mouth three times daily as needed for muscle spasm, Disp: 90 tablet, Rfl: 6   docusate sodium (COLACE) 250 MG capsule, Take 250 mg by mouth daily., Disp: , Rfl:    EPINEPHrine 0.3 mg/0.3 mL IJ SOAJ injection, Inject 0.3 mg into the muscle as needed for anaphylaxis., Disp: , Rfl:    gabapentin (NEURONTIN) 300 MG capsule, TAKE 1 CAPSULE BY MOUTH THREE TIMES DAILY, Disp: 90 capsule, Rfl: 0   lamoTRIgine (LAMICTAL) 25 MG tablet, Take 2 tablets by mouth twice daily, Disp: 120 tablet, Rfl: 0   levothyroxine (SYNTHROID) 75 MCG tablet, TAKE 1 TABLET BY MOUTH ONCE DAILY BEFORE BREAKFAST, Disp: 90 tablet, Rfl: 2   lisdexamfetamine (VYVANSE) 20 MG capsule, Take 1 capsule (20 mg total) by mouth daily., Disp: 30 capsule, Rfl: 0   lisdexamfetamine (VYVANSE) 20 MG capsule, Take 1 capsule (20 mg total) by mouth daily., Disp:  30 capsule, Rfl: 0   lisdexamfetamine (VYVANSE) 20 MG capsule, Take 1 capsule (20 mg total) by mouth daily., Disp: 30 capsule, Rfl: 0   Multiple Vitamins-Minerals (BARIATRIC MULTIVITAMINS/IRON PO), Take 1 tablet by mouth in the morning and at bedtime., Disp: , Rfl:    ondansetron (ZOFRAN-ODT) 4 MG disintegrating tablet, Dissolve 1 tablet (4 mg total) by mouth every 6 (six) hours as needed for nausea or vomiting., Disp: 20 tablet, Rfl: 0   oxyCODONE (ROXICODONE) 5 MG immediate release tablet, Take 1 tablet (5 mg total) by mouth every 6 (six) hours as needed for breakthrough pain., Disp: 10 tablet, Rfl: 0   pantoprazole (PROTONIX)  40 MG tablet, Take 1 tablet (40 mg total) by mouth 2 (two) times daily. **Take twice a day for 1 month after surgery, then decrease to once daily., Disp: 180 tablet, Rfl: 0   Rimegepant Sulfate (NURTEC) 75 MG TBDP, Take 1 tablet by mouth daily as needed (migraine)., Disp: 4 tablet, Rfl: 0   Vitamin D, Ergocalciferol, (DRISDOL) 1.25 MG (50000 UNIT) CAPS capsule, TAKE 1 CAPSULE BY MOUTH ONCE A WEEK EVERY  THURSDAY (Patient taking differently: Take 50,000 Units by mouth every Friday.), Disp: 12 capsule, Rfl: 4   Meds ordered this encounter  Medications   lisdexamfetamine (VYVANSE) 30 MG capsule    Sig: Take 1 capsule (30 mg total) by mouth daily.    Dispense:  30 capsule    Refill:  0   lisdexamfetamine (VYVANSE) 30 MG capsule    Sig: Take 1 capsule (30 mg total) by mouth daily.    Dispense:  30 capsule    Refill:  0   lisdexamfetamine (VYVANSE) 30 MG capsule    Sig: Take 1 capsule (30 mg total) by mouth daily.    Dispense:  30 capsule    Refill:  [place], DO [place] [phone]

## 2022-06-10 NOTE — Telephone Encounter (Signed)
Pt aware script was sent, pharmacy says insurance will not cover- pt is going back to pharmacy to see what he can find out

## 2022-06-13 ENCOUNTER — Other Ambulatory Visit: Payer: Self-pay | Admitting: Family Medicine

## 2022-06-13 DIAGNOSIS — F3177 Bipolar disorder, in partial remission, most recent episode mixed: Secondary | ICD-10-CM

## 2022-06-13 LAB — TOXASSURE SELECT 13 (MW), URINE

## 2022-06-13 NOTE — Telephone Encounter (Signed)
Last office visit 06/10/22 Last refill 05/13/22, #120, no refills

## 2022-06-20 ENCOUNTER — Other Ambulatory Visit: Payer: Self-pay | Admitting: Family Medicine

## 2022-06-20 DIAGNOSIS — M5126 Other intervertebral disc displacement, lumbar region: Secondary | ICD-10-CM

## 2022-07-03 ENCOUNTER — Encounter (INDEPENDENT_AMBULATORY_CARE_PROVIDER_SITE_OTHER): Payer: Self-pay

## 2022-07-29 ENCOUNTER — Other Ambulatory Visit: Payer: Self-pay

## 2022-07-29 ENCOUNTER — Emergency Department (HOSPITAL_BASED_OUTPATIENT_CLINIC_OR_DEPARTMENT_OTHER): Payer: 59 | Admitting: Radiology

## 2022-07-29 ENCOUNTER — Emergency Department (HOSPITAL_BASED_OUTPATIENT_CLINIC_OR_DEPARTMENT_OTHER): Payer: 59

## 2022-07-29 ENCOUNTER — Encounter (HOSPITAL_BASED_OUTPATIENT_CLINIC_OR_DEPARTMENT_OTHER): Payer: Self-pay | Admitting: Emergency Medicine

## 2022-07-29 ENCOUNTER — Other Ambulatory Visit (HOSPITAL_COMMUNITY): Payer: Self-pay

## 2022-07-29 ENCOUNTER — Emergency Department (HOSPITAL_BASED_OUTPATIENT_CLINIC_OR_DEPARTMENT_OTHER)
Admission: EM | Admit: 2022-07-29 | Discharge: 2022-07-29 | Disposition: A | Discharge status: Home / Self Care | Payer: 59 | Attending: Emergency Medicine | Admitting: Emergency Medicine

## 2022-07-29 DIAGNOSIS — R079 Chest pain, unspecified: Secondary | ICD-10-CM | POA: Insufficient documentation

## 2022-07-29 DIAGNOSIS — Z79899 Other long term (current) drug therapy: Secondary | ICD-10-CM | POA: Insufficient documentation

## 2022-07-29 DIAGNOSIS — R42 Dizziness and giddiness: Secondary | ICD-10-CM | POA: Diagnosis not present

## 2022-07-29 DIAGNOSIS — R519 Headache, unspecified: Secondary | ICD-10-CM | POA: Diagnosis not present

## 2022-07-29 DIAGNOSIS — R0789 Other chest pain: Secondary | ICD-10-CM | POA: Diagnosis not present

## 2022-07-29 DIAGNOSIS — R4182 Altered mental status, unspecified: Secondary | ICD-10-CM | POA: Diagnosis not present

## 2022-07-29 DIAGNOSIS — E039 Hypothyroidism, unspecified: Secondary | ICD-10-CM | POA: Diagnosis not present

## 2022-07-29 LAB — CBC
HCT: 44.9 % (ref 39.0–52.0)
Hemoglobin: 15.2 g/dL (ref 13.0–17.0)
MCH: 30.5 pg (ref 26.0–34.0)
MCHC: 33.9 g/dL (ref 30.0–36.0)
MCV: 90.2 fL (ref 80.0–100.0)
Platelets: 219 10*3/uL (ref 150–400)
RBC: 4.98 MIL/uL (ref 4.22–5.81)
RDW: 13.4 % (ref 11.5–15.5)
WBC: 6.6 10*3/uL (ref 4.0–10.5)
nRBC: 0 % (ref 0.0–0.2)

## 2022-07-29 LAB — BASIC METABOLIC PANEL
Anion gap: 7 (ref 5–15)
BUN: 12 mg/dL (ref 6–20)
CO2: 30 mmol/L (ref 22–32)
Calcium: 9.7 mg/dL (ref 8.9–10.3)
Chloride: 103 mmol/L (ref 98–111)
Creatinine, Ser: 0.88 mg/dL (ref 0.61–1.24)
GFR, Estimated: >60 mL/min (ref >60)
Glucose, Bld: 89 mg/dL (ref 70–99)
Potassium: 4.1 mmol/L (ref 3.5–5.1)
Sodium: 140 mmol/L (ref 135–145)

## 2022-07-29 LAB — TROPONIN I (HIGH SENSITIVITY): Troponin I (High Sensitivity): 3 ng/L (ref <18)

## 2022-07-29 MED ORDER — PANTOPRAZOLE SODIUM 20 MG PO TBEC
40.0000 mg | DELAYED_RELEASE_TABLET | Freq: Every day | ORAL | 0 refills | Status: DC
  Filled 2022-07-29: qty 28, 14d supply, fill #0

## 2022-07-29 NOTE — ED Triage Notes (Signed)
Pt hit head on a piece of overhanging wood yesterday and c/o dizziness and "feeling drunk" and bruising easily for past 1.5 weeks.  Pt also reports CP for 2 days.

## 2022-07-29 NOTE — ED Provider Notes (Signed)
[facility] EMERGENCY DEPT Provider Note   CSN: [id number] Arrival date & time: 07/29/22  1124     History  Chief Complaint  Patient presents with   Chest Pain    [name] is a 36 y.o. male.  HPI      On and off chest pain for about one week, tightness  Hit head yesterday on overhanging wood, no LOC  Had dizziness prior to hitting head Lightheadedness, sometimes is spinning, sometimes lightheadedness.  Has happened quite a few times over the last week.   Fatigue.    Denies numbness, weakness, difficulty talking or walking, visual changes or facial droop.   Hx of tingling in hands from back problems and feet/legs for a long time, hx of several back surgeries. Not new.  No trouble walking except when feeling lightheaded Feels more lightheaded when cp worse. When CP comes feels like breath more but not necessarily dyspnea or shortness of breath.   CP tightness, not worse with walking or exertion. Comes and goes, not worse with eating, not positional, not worse with deep breaths, comes and goes not really a pattern. 2-3 times yesterday and was doing something different each time No pain right now Sometimes feel like talking but slower Nausea for 2 days Center of chest, doesn't move anywhere  No cough, fever Headaches for a few months, just started No leg pain or swelling  No long trips/recent surgeries or immobilization Dad had blood clots in 60, had cancer hx, died lymphoma/leukemia, mom had cirrhosis/epilepsy.  No early heart disease. Seldom drinking, no cigarettes, no other drugs  Saw Cardiology in 2017, stress test 2013. Not sure if had blood clot on hx but per cardiology had one that was provoked by ankle surgery in 2003   Past Medical History:  Diagnosis Date   Abscess    Right forearm healing   ADHD (attention deficit hyperactivity disorder)    Anal fissure    Anxiety    Arthritis    back, shoulders, ankles, knees   Back pain     Bipolar disorder ([place])    Blood in stool 04/06/2009   Centricity Description: HEMATOCHEZIA Qualifier: Diagnosis of  By: Westly Pam  Centricity Description: MELENA Qualifier: Diagnosis of  By: Westly Pam    Chest pain    Chronic lower back pain    Constipation    Depression    Esophagitis    Distal esophageal erosions consistent with mild erosive  reflux esophagitis 12/2008 by EGD    External hemorrhoids    Gastric polyps    Gastric ulcer    GERD (gastroesophageal reflux disease)    Hepatic steatosis    Hiatal hernia    History of stomach ulcers    Hx MRSA infection 06/2007   right thigh   Hx of colonic polyps    Hypercholesteremia    denies   Hypothyroidism    Internal hemorrhoids    Irritable bowel syndrome (IBS)    Lower abdominal pain 03/07/2009   Qualifier: Diagnosis of  By: Craige Cotta     Obesity    Occult GI bleeding 12/2008   Trivial upper GI bleed/uncontrolled GERD by upper endoscopy 12/2008, normal f/u endoscopy 03/2009 with SBCE at that time   OSA on CPAP    Pain management    Pneumonia 09/01/2015   "on ATB still" (09/04/2015)   S/P colonoscopy 11/10, 10/08   Dr [name]   Schizophrenia [facility])    Scrotal pain 04/26/2022  Sleep apnea    Stomach ulcer    Syncope and collapse 05/03/2012   Thyroid function test abnormal    Noted in 2011 discharge   Tobacco dipper    Wears contact lenses     Home Medications Prior to Admission medications   Medication Sig Start Date End Date Taking? Authorizing Provider  pantoprazole (PROTONIX) 20 MG tablet Take 2 tablets (40 mg total) by mouth daily for 14 days. 07/29/22 08/13/22 Yes Gareth Morgan, MD  Calcium Carbonate (CALCIUM 600 PO) Take 600 mg by mouth in the morning, at noon, and at bedtime.    [provider]  cetirizine (ZYRTEC) 10 MG tablet Take 10 mg by mouth daily.    [provider]  cyclobenzaprine (FLEXERIL) 10 MG tablet Take 1 tablet by mouth three times daily as needed  for muscle spasm 04/03/22   Ronnie Doss M, DO  docusate sodium (COLACE) 250 MG capsule Take 250 mg by mouth daily.    [provider]  EPINEPHrine 0.3 mg/0.3 mL IJ SOAJ injection Inject 0.3 mg into the muscle as needed for anaphylaxis.    [provider]  gabapentin (NEURONTIN) 300 MG capsule TAKE 1 CAPSULE BY MOUTH THREE TIMES DAILY 06/20/22   Ronnie Doss M, DO  lamoTRIgine (LAMICTAL) 25 MG tablet Take 2 tablets by mouth twice daily 06/14/22   Ronnie Doss M, DO  levothyroxine (SYNTHROID) 75 MCG tablet TAKE 1 TABLET BY MOUTH ONCE DAILY BEFORE BREAKFAST 02/15/22   Ronnie Doss M, DO  lisdexamfetamine (VYVANSE) 30 MG capsule Take 1 capsule (30 mg total) by mouth daily. 06/10/22   [name], DO  lisdexamfetamine (VYVANSE) 30 MG capsule Take 1 capsule (30 mg total) by mouth daily. 08/09/22   [name], DO  lisdexamfetamine (VYVANSE) 30 MG capsule Take 1 capsule (30 mg total) by mouth daily. 07/10/22   [name], DO  Multiple Vitamins-Minerals (BARIATRIC MULTIVITAMINS/IRON PO) Take 1 tablet by mouth in the morning and at bedtime.    [provider]  ondansetron (ZOFRAN-ODT) 4 MG disintegrating tablet Dissolve 1 tablet (4 mg total) by mouth every 6 (six) hours as needed for nausea or vomiting. 06/06/21   Clovis Riley, MD  Rimegepant Sulfate (NURTEC) 75 MG TBDP Take 1 tablet by mouth daily as needed (migraine). 02/20/22   [name], DO  Vitamin D, Ergocalciferol, (DRISDOL) 1.25 MG (50000 UNIT) CAPS capsule TAKE 1 CAPSULE BY MOUTH ONCE A WEEK EVERY  THURSDAY Patient taking differently: Take 50,000 Units by mouth every Friday. 09/24/21   [name], DO      Allergies    Ibuprofen, Influenza vaccines, Tylenol [acetaminophen], Bee venom, and Tramadol    Review of Systems   Review of Systems  Physical Exam Updated Vital Signs BP 114/75 (BP Location: Right Arm)   Pulse 68   Temp 98 F (36.7 C) (Oral)   Resp  20   SpO2 100%  Physical Exam  ED Results / Procedures / Treatments   Labs (all labs ordered are listed, but only abnormal results are displayed) Labs Reviewed  BASIC METABOLIC PANEL  CBC  TROPONIN I (HIGH SENSITIVITY)    EKG EKG Interpretation  Date/Time:  Monday July 29 2022 11:58:36 EDT Ventricular Rate:  70 PR Interval:  140 QRS Duration: 92 QT Interval:  374 QTC Calculation: 403 R Axis:   40 Text Interpretation: Normal sinus rhythm Normal ECG When compared with ECG of 20-Nov-2021 15:36, Criteria for Anterior infarct are no longer Present  Criteria for Anterolateral infarct are no longer Present Criteria for Inferior infarct are no longer Present T wave inversion no longer evident in Inferior leads T wave amplitude has increased in Anterolateral leads previous inversion in lead 3 is now upright No STEMI Confirmed by Antony Blackbird [id number]) on 07/30/2022 12:19:03 PM  Radiology CT Head Wo Contrast  Result Date: 07/29/2022 CLINICAL DATA:  Trauma, altered mental status, dizziness EXAM: CT HEAD WITHOUT CONTRAST TECHNIQUE: Contiguous axial images were obtained from the base of the skull through the vertex without intravenous contrast. RADIATION DOSE REDUCTION: This exam was performed according to the departmental dose-optimization program which includes automated exposure control, adjustment of the mA and/or kV according to patient size and/or use of iterative reconstruction technique. COMPARISON:  05/03/2012 FINDINGS: Brain: No acute intracranial findings seen in noncontrast CT brain. There are no signs of bleeding within the cranium. Ventricles are not dilated. There is no focal edema or mass effect. Vascular: Unremarkable. Skull: Unremarkable. Sinuses/Orbits: Unremarkable. Other: None. IMPRESSION: No acute intracranial findings are seen in noncontrast CT brain. Electronically Signed   By: Elmer Picker M.D.   On: 07/29/2022 13:53   DG Chest 2 View  Result Date:  07/29/2022 CLINICAL DATA:  Chest pain. EXAM: CHEST - 2 VIEW COMPARISON:  December 29, 2020. FINDINGS: The heart size and mediastinal contours are within normal limits. Both lungs are clear. The visualized skeletal structures are unremarkable. IMPRESSION: No active cardiopulmonary disease. Electronically Signed   By: Marijo Conception M.D.   On: 07/29/2022 12:40    Procedures Procedures    Medications Ordered in ED Medications - No data to display  ED Course/ Medical Decision Making/ A&P                           Medical Decision Making Amount and/or Complexity of Data Reviewed Labs: ordered. Radiology: ordered.  Risk Prescription drug management.   36 year old male with a history of bipolar disorder, hyperlipidemia?(he denies), hypothyroidism, sleep apnea, who presents with concern for chest pain and dizziness.  Regarding dizziness--reports headache and dizziness and had a history of head trauma.  CT head was completed and personally abided by me and showed no evidence of intracranial hemorrhage or other acute abnormalities.  His neurologic exam is within normal limits and have low suspicion for CVA.  No sign of cardiac arrhythmia while in the emergency department.  Labs were personally reviewed and interpreted by me and showed no significant anemia, electrolyte abnormalities.  Does report some symptoms that happen spontaneously and are worsened by position, he describes both a sensation of lightheadedness and occasional room spinning.  Its possible that these are orthostatic changes, with differential diagnosis including other arrhythmia or vertigo.  Regarding chest pain Differential diagnosis for chest pain includes pulmonary embolus, dissection, pneumothorax, pneumonia, ACS, myocarditis, pericarditis.  EKG was done and evaluate by me and showed no acute ST changes and no signs of pericarditis. Chest x-ray was done and evaluated by me and radiology and showed no sign of pneumonia or  pneumothorax.  No dyspnea, no asymmetric leg swelling, no hypoxia or tachycardia and have low suspicion for PE.  Patient is low risk HEART score and had negative troponin after 6 hours of symptoms and doubt ACS. Do not feel history or exam are consistent with aortic dissection.   Previously saw Cardiology.  Feel in light of symptoms and risk factors cardiology follow up is appropriate for both chest pain and dizziness.  Had  previously been on antacids but had stopped--will trial pantoprazole given possibity pain could be reflux.  Recommend close PCP follow up as well. Patient discharged in stable condition with understanding of reasons to return.          Final Clinical Impression(s) / ED Diagnoses Final diagnoses:  Chest pain, unspecified type    Rx / DC Orders ED Discharge Orders          Ordered    Ambulatory referral to Cardiology        07/29/22 1452    pantoprazole (PROTONIX) 20 MG tablet  Daily        07/29/22 1453              Gareth Morgan, MD 07/30/22 2343

## 2022-07-30 ENCOUNTER — Telehealth: Payer: Self-pay | Admitting: Internal Medicine

## 2022-07-30 ENCOUNTER — Other Ambulatory Visit (HOSPITAL_COMMUNITY): Payer: Self-pay

## 2022-07-30 NOTE — Telephone Encounter (Signed)
I think we had said that if he could continue losing weight, he might not need CPAP. With DOT involved, can we get him to come by office here for [place] to get a current weight on our scale? Ok to work in a visit with a held spot.

## 2022-07-30 NOTE — Telephone Encounter (Signed)
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

## 2022-07-30 NOTE — Telephone Encounter (Signed)
ATC patient with Dr. Janee Morn recommendations and to see if I could schedule patient in his held slot today or 08/01/22.  LM to call back.

## 2022-07-31 NOTE — Telephone Encounter (Signed)
Called and spoke with patient, provided recommendations per Dr. Annamaria Boots.   Patient states he has not warn the CPAP machine since shortly after he got it.  He wore it for a week and sleeps better without it.  He says he was asked to wear it for another week and see if he slept better with it or without it.  He states that he sleeps better without it.  I asked him if he was looking for documentation from Dr. Annamaria Boots stating that his sleep apnea is controlled without the machine and did not get an answer.  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 Annamaria Boots.    I asked when his DOT exam is tomorrow and he did not know, he stated that his boss sets it up, it could be anywhere between 8:30 am and 5 pm.  I offered to see if he could come in at 11:30 and he said he would just have to go and argue with the DOT doctor tomorrow and schedule an appointment with Dr. Annamaria Boots at another time.  Nothing further needed.

## 2022-07-31 NOTE — Telephone Encounter (Signed)
It is better that he continue using CPAP until after his upcoming DOT exam. He can come in to see me after that and we can decide what to do.

## 2022-08-07 DIAGNOSIS — M793 Panniculitis, unspecified: Secondary | ICD-10-CM | POA: Diagnosis not present

## 2022-08-07 DIAGNOSIS — Z9884 Bariatric surgery status: Secondary | ICD-10-CM | POA: Diagnosis not present

## 2022-08-14 ENCOUNTER — Other Ambulatory Visit (HOSPITAL_COMMUNITY): Payer: Self-pay

## 2022-09-04 ENCOUNTER — Institutional Professional Consult (permissible substitution): Payer: 59 | Admitting: Plastic Surgery

## 2022-09-04 ENCOUNTER — Ambulatory Visit: Payer: 59 | Admitting: Plastic Surgery

## 2022-09-04 ENCOUNTER — Encounter: Payer: Self-pay | Admitting: Plastic Surgery

## 2022-09-04 VITALS — BP 134/82 | HR 103 | Ht 70.0 in | Wt 298.0 lb

## 2022-09-04 DIAGNOSIS — R21 Rash and other nonspecific skin eruption: Secondary | ICD-10-CM

## 2022-09-04 DIAGNOSIS — M793 Panniculitis, unspecified: Secondary | ICD-10-CM

## 2022-09-04 DIAGNOSIS — Z9884 Bariatric surgery status: Secondary | ICD-10-CM

## 2022-09-04 NOTE — Progress Notes (Signed)
Referring Provider [name], DO [place],  [place] [address]   CC:  Chief Complaint  Patient presents with   Advice Only      [name] is an 36 y.o. male.  HPI: [name] is a 36 year old male who has previously undergone Roux-en-Y gastric bypass surgery for obesity and for treatment of stomach ulcers.  The patient relates that he is done well after his gastric bypass surgery and has lost from 403 down to 298 pounds.  He relates that he feels well and his only concern is the apron of skin which hangs down over his symphysis pubis and causes pain and rashes which she has been unable to control with over-the-counter medications.  He requests removal of the pannus for treatment of his pain and infections.  Allergies  Allergen Reactions   Ibuprofen Other (See Comments)    Makes ulcers bleed   Influenza Vaccines Shortness Of Breath and Other (See Comments)    Rash and unable to breathe well   Tylenol [Acetaminophen] Hives   Bee Venom Swelling    SWELLING REACTION UNSPECIFIED    Tramadol Itching    Outpatient Encounter Medications as of 09/04/2022  Medication Sig   Calcium Carbonate (CALCIUM 600 PO) Take 600 mg by mouth in the morning, at noon, and at bedtime.   cetirizine (ZYRTEC) 10 MG tablet Take 10 mg by mouth daily.   cyclobenzaprine (FLEXERIL) 10 MG tablet Take 1 tablet by mouth three times daily as needed for muscle spasm   docusate sodium (COLACE) 250 MG capsule Take 250 mg by mouth daily.   EPINEPHrine 0.3 mg/0.3 mL IJ SOAJ injection Inject 0.3 mg into the muscle as needed for anaphylaxis.   gabapentin (NEURONTIN) 300 MG capsule TAKE 1 CAPSULE BY MOUTH THREE TIMES DAILY   lamoTRIgine (LAMICTAL) 25 MG tablet Take 2 tablets by mouth twice daily   levothyroxine (SYNTHROID) 75 MCG tablet TAKE 1 TABLET BY MOUTH ONCE DAILY BEFORE BREAKFAST   lisdexamfetamine (VYVANSE) 30 MG capsule Take 1 capsule (30 mg total) by mouth daily.   lisdexamfetamine  (VYVANSE) 30 MG capsule Take 1 capsule (30 mg total) by mouth daily.   lisdexamfetamine (VYVANSE) 30 MG capsule Take 1 capsule (30 mg total) by mouth daily.   Multiple Vitamins-Minerals (BARIATRIC MULTIVITAMINS/IRON PO) Take 1 tablet by mouth in the morning and at bedtime.   ondansetron (ZOFRAN-ODT) 4 MG disintegrating tablet Dissolve 1 tablet (4 mg total) by mouth every 6 (six) hours as needed for nausea or vomiting.   pantoprazole (PROTONIX) 20 MG tablet Take 2 tablets (40 mg total) by mouth daily for 14 days.   Rimegepant Sulfate (NURTEC) 75 MG TBDP Take 1 tablet by mouth daily as needed (migraine).   Vitamin D, Ergocalciferol, (DRISDOL) 1.25 MG (50000 UNIT) CAPS capsule TAKE 1 CAPSULE BY MOUTH ONCE A WEEK EVERY  THURSDAY (Patient taking differently: Take 50,000 Units by mouth every Friday.)   No facility-administered encounter medications on file as of 09/04/2022.     Past Medical History:  Diagnosis Date   Abscess    Right forearm healing   ADHD (attention deficit hyperactivity disorder)    Anal fissure    Anxiety    Arthritis    back, shoulders, ankles, knees   Back pain    Bipolar disorder ([place])    Blood in stool 04/06/2009   Centricity Description: HEMATOCHEZIA Qualifier: Diagnosis of  By: Westly Pam  Centricity Description: MELENA Qualifier: Diagnosis of  By: Westly Pam    Chest pain    Chronic lower back pain    Constipation    Depression    Esophagitis    Distal esophageal erosions consistent with mild erosive  reflux esophagitis 12/2008 by EGD    External hemorrhoids    Gastric polyps    Gastric ulcer    GERD (gastroesophageal reflux disease)    Hepatic steatosis    Hiatal hernia    History of stomach ulcers    Hx MRSA infection 06/2007   right thigh   Hx of colonic polyps    Hypercholesteremia    denies   Hypothyroidism    Internal hemorrhoids    Irritable bowel syndrome (IBS)    Lower abdominal pain 03/07/2009   Qualifier: Diagnosis of   By: Craige Cotta     Obesity    Occult GI bleeding 12/2008   Trivial upper GI bleed/uncontrolled GERD by upper endoscopy 12/2008, normal f/u endoscopy 03/2009 with SBCE at that time   OSA on CPAP    Pain management    Pneumonia 09/01/2015   "on ATB still" (09/04/2015)   S/P colonoscopy 11/10, 10/08   Dr [name]   Schizophrenia [facility])    Scrotal pain 04/26/2022   Sleep apnea    Stomach ulcer    Syncope and collapse 05/03/2012   Thyroid function test abnormal    Noted in 2011 discharge   Tobacco dipper    Wears contact lenses     Past Surgical History:  Procedure Laterality Date   ANKLE FRACTURE SURGERY Left ~ 2008   BACK SURGERY     BIOPSY  11/02/2019   Procedure: BIOPSY;  Surgeon: Lavena Bullion, DO;  Location: [facility];  Service: Gastroenterology;;  EGD and Colon   BIOPSY  06/07/2020   Procedure: BIOPSY;  Surgeon: Lavena Bullion, DO;  Location: [facility];  Service: Gastroenterology;;   [place] STUDY N/A 06/07/2020   Procedure: BRAVO [place] STUDY on PPI;  Surgeon: Lavena Bullion, DO;  [facility]: [facility];  Service: Gastroenterology;  Laterality: N/A;   COLONOSCOPY  10/10/2009   anal papilla otherwise normal   COLONOSCOPY WITH PROPOFOL N/A 11/02/2019   Procedure: COLONOSCOPY WITH PROPOFOL;  Surgeon: Lavena Bullion, DO;  [facility];  Service: Gastroenterology;  Laterality: N/A;   ESOPHAGOGASTRODUODENOSCOPY  04/07/2009   Normal esophagus, small hiatal hernia   ESOPHAGOGASTRODUODENOSCOPY  01/09/2009   Distal esophageal erosions consistent with mild erosive reflux esophagitis, otherwise normal esophagus, small hiatal herniaotherwise normal stomach, D1-D2    ESOPHAGOGASTRODUODENOSCOPY  08/26/2007   Normal esophagus, a small hiatal/hernia, otherwise normal stomach D1 through D3   ESOPHAGOGASTRODUODENOSCOPY (EGD) WITH PROPOFOL N/A 11/02/2019   Procedure: ESOPHAGOGASTRODUODENOSCOPY (EGD) WITH PROPOFOL;  Surgeon: Lavena Bullion, DO;  [facility];  Service: Gastroenterology;  Laterality: N/A;   ESOPHAGOGASTRODUODENOSCOPY (EGD) WITH PROPOFOL N/A 06/07/2020   Procedure: ESOPHAGOGASTRODUODENOSCOPY (EGD) WITH PROPOFOL;  Surgeon: Lavena Bullion, DO;  [facility];  Service: Gastroenterology;  Laterality: N/A;   FRACTURE SURGERY     GASTRIC BYPASS     GASTRIC ROUX-EN-Y N/A 06/05/2021   Procedure: LAPAROSCOPIC ROUX-EN-Y GASTRIC BYPASS WITH UPPER ENDOSCOPY;  Surgeon: Clovis Riley, MD;  Location[facility];  Service: General;  Laterality: N/A;   ileocolonoscopy  08/26/2007    A normal rectum, colon, and terminal ileum   INCISION AND DRAINAGE  09/04/2015   "reopened my back incsion"   LUMBAR LAMINECTOMY/DECOMPRESSION MICRODISCECTOMY Left 09/23/2017   Procedure: Microdiscectomy -  left - Lumbar four-Lumbar five;  Surgeon: Earnie Larsson, MD;  Location: [place];  Service: Neurosurgery;  Laterality: Left;   LUMBAR MICRODISCECTOMY  08/21/2015   LUMBAR MICRODISCECTOMY Left 09/23/2017   L4-5   LUMBAR WOUND DEBRIDEMENT N/A 09/04/2015   Procedure: LUMBAR WOUND DEBRIDEMENT;  Surgeon: Consuella Lose, MD;  Location: [facility];  Service: Neurosurgery;  Laterality: N/A;   right side sugery     enlarged lymph node gland removed under arm pit age 27-5 years   SHOULDER ARTHROSCOPY WITH DISTAL CLAVICLE RESECTION Right 11/22/2021   Procedure: SHOULDER ARTHROSCOPY WITH SUBACROMIAL DECOMPRESSION AND DISTAL CLAVICLE EXCISION;  Surgeon: Mordecai Rasmussen, MD;  Location: [facility];  Service: Orthopedics;  Laterality: Right;   Small bowel capsule  04/11/2009    normal throughout   VASECTOMY      Family History  Problem Relation Age of Onset   Leukemia Father 73   Colon polyps Father    Clotting disorder Father    Heart disease Father    Cancer Father    Depression Father    Sleep apnea Father    Obesity Father    Seizures Mother    Irritable bowel syndrome Mother    Thyroid disease Mother    Depression Mother    Anxiety disorder  Mother    Bipolar disorder Brother    ADD / ADHD Brother    Diabetes Paternal Grandmother    Ulcerative colitis Paternal Aunt    Colon cancer Neg Hx    Liver cancer Neg Hx    Esophageal cancer Neg Hx    Stomach cancer Neg Hx    Pancreatic cancer Neg Hx     Social History   Social History Narrative   Not on file     Review of Systems General: Denies fevers, chills, weight loss CV: Denies chest pain, shortness of breath, palpitations Skin: Rashes in the intertriginous regions and under the central portion of the pannus.  Physical Exam    09/04/2022    8:03 AM 07/29/2022    3:00 PM 07/29/2022    2:30 PM  Vitals with BMI  Height '5\' 10"'$     Weight 298 lbs    BMI 81.27    Systolic 517 001 749  Diastolic 82 75 63  Pulse 449 68 63    General:  No acute distress,  Alert and oriented, Non-Toxic, Normal speech and affect BMI 42.7 Skin: Erythema and friable skin along the posterior aspect of the pannus. Abdomen: Significant anterior abdominal wall skin and fat with evidence of ongoing panniculitis.  Assessment/Plan Panniculitis: The patient requests excision of the pannus.  This is a reasonable request as he has ongoing infections and currently has erythema and friable skin documented in his photos today.  We discussed the procedure including the location of the incisions, the fact that this does not address any of the fat or skin above the umbilicus, the use of drains and compression after the procedure, and restrictions which are 2 weeks of light activity after the surgery and 6 weeks until he can return to full unrestricted activities including lifting anything greater than 20 pounds.  He verbalized understanding and asked that I proceed with scheduling him for surgery.  Camillia Herter 09/04/2022, 8:21 AM

## 2022-09-10 ENCOUNTER — Telehealth: Payer: Self-pay

## 2022-09-10 ENCOUNTER — Telehealth: Payer: Self-pay | Admitting: Pharmacist

## 2022-09-10 ENCOUNTER — Encounter: Payer: Self-pay | Admitting: Family Medicine

## 2022-09-10 ENCOUNTER — Ambulatory Visit (INDEPENDENT_AMBULATORY_CARE_PROVIDER_SITE_OTHER): Payer: 59 | Admitting: Family Medicine

## 2022-09-10 VITALS — BP 116/65 | HR 109 | Temp 98.0°F | Ht 70.0 in | Wt 299.0 lb

## 2022-09-10 DIAGNOSIS — E039 Hypothyroidism, unspecified: Secondary | ICD-10-CM | POA: Diagnosis not present

## 2022-09-10 DIAGNOSIS — F902 Attention-deficit hyperactivity disorder, combined type: Secondary | ICD-10-CM

## 2022-09-10 DIAGNOSIS — F3177 Bipolar disorder, in partial remission, most recent episode mixed: Secondary | ICD-10-CM

## 2022-09-10 DIAGNOSIS — K2100 Gastro-esophageal reflux disease with esophagitis, without bleeding: Secondary | ICD-10-CM

## 2022-09-10 DIAGNOSIS — Z9884 Bariatric surgery status: Secondary | ICD-10-CM | POA: Diagnosis not present

## 2022-09-10 DIAGNOSIS — R69 Illness, unspecified: Secondary | ICD-10-CM | POA: Diagnosis not present

## 2022-09-10 MED ORDER — PANTOPRAZOLE SODIUM 40 MG PO TBEC: 40.0000 mg | DELAYED_RELEASE_TABLET | Freq: Every day | ORAL | 0 refills | Status: DC | PRN

## 2022-09-10 MED ORDER — LISDEXAMFETAMINE DIMESYLATE 30 MG PO CAPS: 30.0000 mg | ORAL_CAPSULE | Freq: Every day | ORAL | 0 refills | Status: DC

## 2022-09-10 NOTE — Progress Notes (Addendum)
Subjective: CC: ADHD follow-up, hypothyroidism PCP: [name], DO [id number] B [name] is a 36 y.o. male presenting to clinic today for:  1.  AD HD Patient reports that he has been out of his Vyvanse for the last 2 months because his insurance will not cover it.  They will cover Adderall but this has been backorder which is why he initially switch to the Vyvanse.  He reports difficulty focusing some issues with mood lability despite compliance with Lamictal.  2.  Hypothyroidism Compliant with Synthroid.  Denies any tremor, heart palpitations or changes in energy.  No dysphagia.  3.  Dyspepsia Patient reports good control of dyspepsia with Protonix but needs refills.  Has history of gastric bypass   ROS: Per HPI  Allergies  Allergen Reactions   Ibuprofen Other (See Comments)    Makes ulcers bleed   Influenza Vaccines Shortness Of Breath and Other (See Comments)    Rash and unable to breathe well   Tylenol [Acetaminophen] Hives   Bee Venom Swelling    SWELLING REACTION UNSPECIFIED    Tramadol Itching   Past Medical History:  Diagnosis Date   Abscess    Right forearm healing   ADHD (attention deficit hyperactivity disorder)    Anal fissure    Anxiety    Arthritis    back, shoulders, ankles, knees   Back pain    Bipolar disorder ([place])    Blood in stool 04/06/2009   Centricity Description: HEMATOCHEZIA Qualifier: Diagnosis of  By: Westly Pam  Centricity Description: MELENA Qualifier: Diagnosis of  By: Westly Pam    Chest pain    Chronic lower back pain    Constipation    Depression    Esophagitis    Distal esophageal erosions consistent with mild erosive  reflux esophagitis 12/2008 by EGD    External hemorrhoids    Gastric polyps    Gastric ulcer    GERD (gastroesophageal reflux disease)    Hepatic steatosis    Hiatal hernia    History of stomach ulcers    Hx MRSA infection 06/2007   right thigh   Hx of colonic polyps     Hypercholesteremia    denies   Hypothyroidism    Internal hemorrhoids    Irritable bowel syndrome (IBS)    Lower abdominal pain 03/07/2009   Qualifier: Diagnosis of  By: Craige Cotta     Obesity    Occult GI bleeding 12/2008   Trivial upper GI bleed/uncontrolled GERD by upper endoscopy 12/2008, normal f/u endoscopy 03/2009 with SBCE at that time   OSA on CPAP    Pain management    Pneumonia 09/01/2015   "on ATB still" (09/04/2015)   S/P colonoscopy 11/10, 10/08   Dr [name]   Schizophrenia [facility])    Scrotal pain 04/26/2022   Sleep apnea    Stomach ulcer    Syncope and collapse 05/03/2012   Thyroid function test abnormal    Noted in 2011 discharge   Tobacco dipper    Wears contact lenses     Current Outpatient Medications:    Calcium Carbonate (CALCIUM 600 PO), Take 600 mg by mouth in the morning, at noon, and at bedtime., Disp: , Rfl:    cetirizine (ZYRTEC) 10 MG tablet, Take 10 mg by mouth daily., Disp: , Rfl:    cyclobenzaprine (FLEXERIL) 10 MG tablet, Take 1 tablet by mouth three times daily as needed for muscle spasm, Disp: 90 tablet, Rfl: 6  docusate sodium (COLACE) 250 MG capsule, Take 250 mg by mouth daily., Disp: , Rfl:    EPINEPHrine 0.3 mg/0.3 mL IJ SOAJ injection, Inject 0.3 mg into the muscle as needed for anaphylaxis., Disp: , Rfl:    gabapentin (NEURONTIN) 300 MG capsule, TAKE 1 CAPSULE BY MOUTH THREE TIMES DAILY, Disp: 90 capsule, Rfl: 1   lamoTRIgine (LAMICTAL) 25 MG tablet, Take 2 tablets by mouth twice daily, Disp: 360 tablet, Rfl: 3   levothyroxine (SYNTHROID) 75 MCG tablet, TAKE 1 TABLET BY MOUTH ONCE DAILY BEFORE BREAKFAST, Disp: 90 tablet, Rfl: 2   lisdexamfetamine (VYVANSE) 30 MG capsule, Take 1 capsule (30 mg total) by mouth daily., Disp: 30 capsule, Rfl: 0   lisdexamfetamine (VYVANSE) 30 MG capsule, Take 1 capsule (30 mg total) by mouth daily., Disp: 30 capsule, Rfl: 0   lisdexamfetamine (VYVANSE) 30 MG capsule, Take 1 capsule (30 mg total) by mouth  daily., Disp: 30 capsule, Rfl: 0   Multiple Vitamins-Minerals (BARIATRIC MULTIVITAMINS/IRON PO), Take 1 tablet by mouth in the morning and at bedtime., Disp: , Rfl:    ondansetron (ZOFRAN-ODT) 4 MG disintegrating tablet, Dissolve 1 tablet (4 mg total) by mouth every 6 (six) hours as needed for nausea or vomiting., Disp: 20 tablet, Rfl: 0   pantoprazole (PROTONIX) 20 MG tablet, Take 2 tablets (40 mg total) by mouth daily for 14 days., Disp: 28 tablet, Rfl: 0   Rimegepant Sulfate (NURTEC) 75 MG TBDP, Take 1 tablet by mouth daily as needed (migraine)., Disp: 4 tablet, Rfl: 0   Vitamin D, Ergocalciferol, (DRISDOL) 1.25 MG (50000 UNIT) CAPS capsule, TAKE 1 CAPSULE BY MOUTH ONCE A WEEK EVERY  THURSDAY (Patient taking differently: Take 50,000 Units by mouth every Friday.), Disp: 12 capsule, Rfl: 4 Social History   Socioeconomic History   Marital status: Married    Spouse name: Tabitha   Number of children: 2   Years of education: Not on file   Highest education level: Not on file  Occupational History   Occupation: Firefighter    Employer: [employer]   Occupation: truck Geophysicist/field seismologist  Tobacco Use   Smoking status: Never   Smokeless tobacco: Former    Types: Snuff    Quit date: 12/16/2020   Tobacco comments:    quitting snuff. whinning off   Vaping Use   Vaping Use: Never used  Substance and Sexual Activity   Alcohol use: Not Currently   Drug use: No   Sexual activity: Not Currently  Other Topics Concern   Not on file  Social History Narrative   Not on file   Social Determinants of Health   Financial Resource Strain: Not on file  Food Insecurity: Not on file  Transportation Needs: Not on file  Physical Activity: Not on file  Stress: Not on file  Social Connections: Not on file  Intimate Partner Violence: Not on file   Family History  Problem Relation Age of Onset   Leukemia Father 28   Colon polyps Father    Clotting disorder Father    Heart disease Father    Cancer Father     Depression Father    Sleep apnea Father    Obesity Father    Seizures Mother    Irritable bowel syndrome Mother    Thyroid disease Mother    Depression Mother    Anxiety disorder Mother    Bipolar disorder Brother    ADD / ADHD Brother    Diabetes Paternal Grandmother    Ulcerative colitis  Paternal Aunt    Colon cancer Neg Hx    Liver cancer Neg Hx    Esophageal cancer Neg Hx    Stomach cancer Neg Hx    Pancreatic cancer Neg Hx     Objective: Office vital signs reviewed. BP 116/65   Pulse (!) 109   Temp 98 F (36.7 C)   Ht '5\' 10"'$  (1.778 m)   Wt 299 lb (135.6 kg)   SpO2 100%   BMI 42.90 kg/m   Physical Examination:  General: Awake, alert, morbidly obese, No acute distress HEENT: Sclera white.  Moist mucous membranes Cardio: regular rate and rhythm, S1S2 heard, no murmurs appreciated Pulm: clear to auscultation bilaterally, no wheezes, rhonchi or rales; normal work of breathing on room air MSK: Normal gait and station Skin: dry; intact; no rashes or lesions Neuro: No tremor  Assessment/ Plan: 36 y.o. male   Attention deficit hyperactivity disorder (ADHD), combined type  Acquired hypothyroidism - Plan: TSH, T4, Free  Bipolar disorder, in partial remission, most recent episode mixed (HCC)  S/P gastric bypass  Gastroesophageal reflux disease with esophagitis without hemorrhage - Plan: pantoprazole (PROTONIX) 40 MG tablet  ADHD is not currently controlled but he has been out of medicine.  Will work towards getting up or authorization for that Vyvanse, otherwise we will plan to reduce dose of Adderall extended release given use of PPI and history of gastric bypass.  Asymptomatic from a thyroid standpoint.  Check thyroid levels  I reviewed his recent CMP and CBC obtained in September.  This was not reordered.  Continue current treatment with Lamictal  PPI renewed for as needed use.  Orders Placed This Encounter  Procedures   TSH   T4, Free   Meds ordered  this encounter  Medications   pantoprazole (PROTONIX) 40 MG tablet    Sig: Take 1 tablet (40 mg total) by mouth daily as needed for heartburn or indigestion.    Dispense:  90 tablet    Refill:  0   lisdexamfetamine (VYVANSE) 30 MG capsule    Sig: Take 1 capsule (30 mg total) by mouth daily.    Dispense:  30 capsule    Refill:  0   lisdexamfetamine (VYVANSE) 30 MG capsule    Sig: Take 1 capsule (30 mg total) by mouth daily.    Dispense:  30 capsule    Refill:  0   lisdexamfetamine (VYVANSE) 30 MG capsule    Sig: Take 1 capsule (30 mg total) by mouth daily.    Dispense:  30 capsule    Refill:  0   **Update, Vyvanse PA approved. Rx sent. The Narcotic Database has been reviewed.  There were no red flags.     [name], DO [place] [phone]

## 2022-09-10 NOTE — Telephone Encounter (Signed)
Attempting to get vyvanse covered  Due to h/o gastric bypass (vyvanse is a prodrug-better absorbed in the setting of bypass vs other formulary alternatives)

## 2022-09-10 NOTE — Telephone Encounter (Signed)
Wonderful, let him know I'm sending the Vyvanse over for him.

## 2022-09-10 NOTE — Addendum Note (Signed)
Addended by: [name] on: 09/10/2022 01:01 PM   Modules accepted: Orders

## 2022-09-10 NOTE — Telephone Encounter (Signed)
Judi Cong KeyCarlena Bjornstad - PA Case ID: [id number] Need help? Call us at [phone] Outcome Approvedtoday Your PA request has been approved. Additional information will be provided in the approval communication. (Message 1145)

## 2022-09-11 LAB — T4, FREE: Free T4: 1.29 ng/dL (ref 0.82–1.77)

## 2022-09-11 LAB — TSH: TSH: 1.95 u[IU]/mL (ref 0.450–4.500)

## 2022-09-12 ENCOUNTER — Other Ambulatory Visit: Payer: Self-pay | Admitting: Family Medicine

## 2022-09-12 DIAGNOSIS — M5126 Other intervertebral disc displacement, lumbar region: Secondary | ICD-10-CM

## 2022-09-25 DIAGNOSIS — S92911A Unspecified fracture of right toe(s), initial encounter for closed fracture: Secondary | ICD-10-CM | POA: Diagnosis not present

## 2022-09-27 NOTE — Telephone Encounter (Signed)
See other telephone encounter-PA approved and pt is aware.

## 2022-10-24 ENCOUNTER — Other Ambulatory Visit: Payer: Self-pay | Admitting: Family Medicine

## 2022-10-24 DIAGNOSIS — E559 Vitamin D deficiency, unspecified: Secondary | ICD-10-CM

## 2022-11-19 DIAGNOSIS — R109 Unspecified abdominal pain: Secondary | ICD-10-CM | POA: Diagnosis not present

## 2022-11-21 ENCOUNTER — Telehealth: Payer: Self-pay

## 2022-11-21 NOTE — Telephone Encounter (Signed)
Called patient, advised panniculectomy was denied due not having medical records that document the panniculus causes chronic intertrigo as well as having  consistently recurring rashes over 3 months receiving appropriate treatment.  Our photo that was taken in our office clearly shows rashes underneath panniculus lifted. Will wait to hear back from patient after he sees his PCP for 3 months showing he had medical treatments that fail.

## 2022-11-28 ENCOUNTER — Other Ambulatory Visit: Payer: Self-pay | Admitting: Family Medicine

## 2022-12-12 ENCOUNTER — Other Ambulatory Visit: Payer: Self-pay | Admitting: Family Medicine

## 2022-12-12 DIAGNOSIS — K2100 Gastro-esophageal reflux disease with esophagitis, without bleeding: Secondary | ICD-10-CM

## 2022-12-16 ENCOUNTER — Encounter: Payer: Self-pay | Admitting: Family Medicine

## 2022-12-16 ENCOUNTER — Ambulatory Visit (INDEPENDENT_AMBULATORY_CARE_PROVIDER_SITE_OTHER): Payer: 59 | Admitting: Family Medicine

## 2022-12-16 VITALS — BP 127/74 | HR 98 | Temp 98.7°F | Ht 70.0 in | Wt 300.6 lb

## 2022-12-16 DIAGNOSIS — Z9884 Bariatric surgery status: Secondary | ICD-10-CM | POA: Diagnosis not present

## 2022-12-16 DIAGNOSIS — M5126 Other intervertebral disc displacement, lumbar region: Secondary | ICD-10-CM | POA: Diagnosis not present

## 2022-12-16 DIAGNOSIS — E559 Vitamin D deficiency, unspecified: Secondary | ICD-10-CM

## 2022-12-16 DIAGNOSIS — F902 Attention-deficit hyperactivity disorder, combined type: Secondary | ICD-10-CM

## 2022-12-16 DIAGNOSIS — F3177 Bipolar disorder, in partial remission, most recent episode mixed: Secondary | ICD-10-CM

## 2022-12-16 DIAGNOSIS — B372 Candidiasis of skin and nail: Secondary | ICD-10-CM | POA: Diagnosis not present

## 2022-12-16 DIAGNOSIS — Z79899 Other long term (current) drug therapy: Secondary | ICD-10-CM | POA: Diagnosis not present

## 2022-12-16 DIAGNOSIS — R69 Illness, unspecified: Secondary | ICD-10-CM | POA: Diagnosis not present

## 2022-12-16 MED ORDER — VITAMIN D (ERGOCALCIFEROL) 1.25 MG (50000 UNIT) PO CAPS: ORAL_CAPSULE | ORAL | 0 refills | Status: DC

## 2022-12-16 MED ORDER — LEVOTHYROXINE SODIUM 75 MCG PO TABS: 75.0000 ug | ORAL_TABLET | Freq: Every day | ORAL | 3 refills | Status: DC

## 2022-12-16 MED ORDER — LISDEXAMFETAMINE DIMESYLATE 30 MG PO CAPS: 30.0000 mg | ORAL_CAPSULE | Freq: Every day | ORAL | 0 refills | Status: DC

## 2022-12-16 MED ORDER — KETOCONAZOLE 2 % EX CREA: 1.0000 | TOPICAL_CREAM | Freq: Every day | CUTANEOUS | 1 refills | Status: DC

## 2022-12-16 MED ORDER — EPINEPHRINE 0.3 MG/0.3ML IJ SOAJ: 0.3000 mg | INTRAMUSCULAR | 0 refills | Status: DC | PRN

## 2022-12-16 MED ORDER — LAMOTRIGINE 25 MG PO TABS: 50.0000 mg | ORAL_TABLET | Freq: Two times a day (BID) | ORAL | 3 refills | Status: DC

## 2022-12-16 MED ORDER — GABAPENTIN 300 MG PO CAPS: 300.0000 mg | ORAL_CAPSULE | Freq: Three times a day (TID) | ORAL | 3 refills | Status: DC

## 2022-12-16 NOTE — Progress Notes (Signed)
I have separately seen and examined the patient. I have discussed the findings and exam with student Dr Jerline Pain and agree with the below note.  My changes/additions are outlined in BLUE.    S: He reports control of ADHD with Vyvanse.  He is no longer having any issues securing the medication.  He denies any excessive stimulation from the medication.  Mood has been stable with current meds.  He does report some dry mouth but this has been a chronic issue for him and he hydrates well.  No reports of constipation, chest pain, shortness of breath or tachycardia  Recurrent rash: Patient reports recurrent rash under his abdomen.  This has been an issue for years but certainly continues to recur even after his bariatric surgery.  The rash becomes itchy and red.  He has seen his surgeon about this and he recommended that he follow-up with PCP for starting antifungal.  Hopefully they will be pursuing some type of skin surgery to reduce recurrence of this going forward  O: Vitals:   12/16/22 0918  BP: 127/74  Pulse: 98  Temp: 98.7 F (37.1 C)  SpO2: 100%    General appearance: alert, cooperative, appears stated age, and no distress Eyes:  Sclera white. Lungs: clear to auscultation bilaterally Heart: regular rate and rhythm, S1, S2 normal, no murmur, click, rub or gallop Skin:  No active rashes Psych: Mood stable, speech normal, affect appropriate.  Very pleasant and interactive     12/16/2022    9:18 AM 09/10/2022    9:08 AM 06/10/2022    8:11 AM  Depression screen PHQ 2/9  Decreased Interest 0 0 0  Down, Depressed, Hopeless 0 0 0  PHQ - 2 Score 0 0 0  Altered sleeping 0    Tired, decreased energy 0    Change in appetite 1    Feeling bad or failure about yourself  0    Trouble concentrating 0    Moving slowly or fidgety/restless 0    Suicidal thoughts 0    PHQ-9 Score 1    Difficult doing work/chores Not difficult at all        12/16/2022    9:19 AM 09/10/2022    9:08 AM 06/10/2022     8:11 AM 03/05/2022    8:14 AM  GAD 7 : Generalized Anxiety Score  Nervous, Anxious, on Edge 1 0 0 1  Control/stop worrying 0 0 0 0  Worry too much - different things 0 0 0 0  Trouble relaxing 0 0 0 0  Restless 0 0 0 0  Easily annoyed or irritable 1 0 0 0  Afraid - awful might happen 0 0 0 0  Total GAD 7 Score 2 0 0 1  Anxiety Difficulty Not difficult at all Not difficult at all Not difficult at all Not difficult at all      A/P:  Attention deficit hyperactivity disorder (ADHD), combined type - Plan: ToxASSURE Select 13 (MW), Urine, lisdexamfetamine (VYVANSE) 30 MG capsule, lisdexamfetamine (VYVANSE) 30 MG capsule, lisdexamfetamine (VYVANSE) 30 MG capsule  Controlled substance agreement signed - Plan: ToxASSURE Select 13 (MW), Urine  Lumbar disc herniation - Plan: gabapentin (NEURONTIN) 300 MG capsule  Bipolar disorder, in partial remission, most recent episode mixed (HCC) - Plan: lamoTRIgine (LAMICTAL) 25 MG tablet  Vitamin D deficiency - Plan: Vitamin D, Ergocalciferol, (DRISDOL) 1.25 MG (50000 UNIT) CAPS capsule  Candidal intertrigo - Plan: ketoconazole (NIZORAL) 2 % cream  S/P gastric bypass - Plan: ketoconazole (NIZORAL)  2 % cream  ADHD chronic and stable.  UDS and CSC were updated as per office policy today.  The national narcotic database reviewed and there were no red flags  Mood is stable.  Continue Lamictal.  Plan for CMP, CBC at next visit.  Need to plan for vitamin D level at next visit.  Vitamin D has been renewed  Ketoconazole ordered for intermittent intertrigo.  Discussed skin care including keeping area as dry as possible.   Iva Posten M. Lajuana Ripple, [facility]   -------------------------------------------------------------------------------------------------------------------------------------------------------------------------------------     Subjective: CC: ADHD  Rash PCP: [name], DO HDQ:[id number] B [name] is  a 37 y.o. male presenting to clinic today for:  ADHD and Mood Disorder Patient reports that he is compliant with his medications. He reports that his mood symptoms and ADHD are well controlled. No instances of losing his concentration while on the road (patient works as a Administrator). Notes some dry mouth and loss of appetite, although this is also secondary to his Roux-en-Y surgery two years ago. No systemic rashes, dizziness, headaches, tremors, nausea, vomiting, diarrhea, or constipation.  Rash Patient reports approximately one year of worsening rash in skin folds, particularly beneath the pannus. The rash is superficially painful during the summer months when it was particularly inflamed. The patient notes that he has significant skin flaps secondary to losing weight from his surgery.  Hypothyroidism Patient reports that he is compliant with his Synthroid medication. Denies easy fatigability, weight loss, dry or brittle skin, cold sensitivity. Also denies rapid heartbeat, unintentional weight loss, excessive sweating, and heat intolerance.   ROS: Per HPI  Allergies  Allergen Reactions   Ibuprofen Other (See Comments)    Makes ulcers bleed   Influenza Vaccines Shortness Of Breath and Other (See Comments)    Rash and unable to breathe well   Tylenol [Acetaminophen] Hives   Bee Venom Swelling    SWELLING REACTION UNSPECIFIED    Tramadol Itching   Past Medical History:  Diagnosis Date   Abscess    Right forearm healing   ADHD (attention deficit hyperactivity disorder)    Anal fissure    Anxiety    Arthritis    back, shoulders, ankles, knees   Back pain    Bipolar disorder ([place])    Blood in stool 04/06/2009   Centricity Description: HEMATOCHEZIA Qualifier: Diagnosis of  By: Westly Pam  Centricity Description: MELENA Qualifier: Diagnosis of  By: Westly Pam    Chest pain    Chronic lower back pain    Constipation    Depression    Esophagitis    Distal  esophageal erosions consistent with mild erosive  reflux esophagitis 12/2008 by EGD    External hemorrhoids    Gastric polyps    Gastric ulcer    GERD (gastroesophageal reflux disease)    Hepatic steatosis    Hiatal hernia    History of stomach ulcers    Hx MRSA infection 06/2007   right thigh   Hx of colonic polyps    Hypercholesteremia    denies   Hypothyroidism    Internal hemorrhoids    Irritable bowel syndrome (IBS)    Lower abdominal pain 03/07/2009   Qualifier: Diagnosis of  By: Craige Cotta     Obesity    Occult GI bleeding 12/2008   Trivial upper GI bleed/uncontrolled GERD by upper endoscopy 12/2008, normal f/u endoscopy 03/2009 with SBCE at that time   OSA on CPAP  Pain management    Pneumonia 09/01/2015   "on ATB still" (09/04/2015)   S/P colonoscopy 11/10, 10/08   Dr [name]   Schizophrenia [facility])    Scrotal pain 04/26/2022   Sleep apnea    Stomach ulcer    Syncope and collapse 05/03/2012   Thyroid function test abnormal    Noted in 2011 discharge   Tobacco dipper    Wears contact lenses     Current Outpatient Medications:    Calcium Carbonate (CALCIUM 600 PO), Take 600 mg by mouth in the morning, at noon, and at bedtime., Disp: , Rfl:    cetirizine (ZYRTEC) 10 MG tablet, Take 10 mg by mouth daily., Disp: , Rfl:    cyclobenzaprine (FLEXERIL) 10 MG tablet, Take 1 tablet by mouth three times daily as needed for muscle spasm, Disp: 90 tablet, Rfl: 6   docusate sodium (COLACE) 250 MG capsule, Take 250 mg by mouth daily., Disp: , Rfl:    ketoconazole (NIZORAL) 2 % cream, Apply 1 Application topically daily. X10-14 days per flare up of fungal rash, Disp: 60 g, Rfl: 1   Multiple Vitamins-Minerals (BARIATRIC MULTIVITAMINS/IRON PO), Take 1 tablet by mouth in the morning and at bedtime., Disp: , Rfl:    ondansetron (ZOFRAN-ODT) 4 MG disintegrating tablet, Dissolve 1 tablet (4 mg total) by mouth every 6 (six) hours as needed for nausea or vomiting., Disp: 20 tablet, Rfl:  0   pantoprazole (PROTONIX) 40 MG tablet, TAKE 1 TABLET BY MOUTH ONCE DAILY AS NEEDED FOR  HEARTBURN  OR  INDIGESTION, Disp: 90 tablet, Rfl: 0   Rimegepant Sulfate (NURTEC) 75 MG TBDP, Take 1 tablet by mouth daily as needed (migraine)., Disp: 4 tablet, Rfl: 0   EPINEPHrine 0.3 mg/0.3 mL IJ SOAJ injection, Inject 0.3 mg into the muscle as needed for anaphylaxis., Disp: 1 each, Rfl: 0   gabapentin (NEURONTIN) 300 MG capsule, Take 1 capsule (300 mg total) by mouth 3 (three) times daily., Disp: 270 capsule, Rfl: 3   lamoTRIgine (LAMICTAL) 25 MG tablet, Take 2 tablets (50 mg total) by mouth 2 (two) times daily., Disp: 360 tablet, Rfl: 3   levothyroxine (SYNTHROID) 75 MCG tablet, Take 1 tablet (75 mcg total) by mouth daily before breakfast., Disp: 90 tablet, Rfl: 3   [START ON 01/15/2023] lisdexamfetamine (VYVANSE) 30 MG capsule, Take 1 capsule (30 mg total) by mouth daily., Disp: 30 capsule, Rfl: 0   lisdexamfetamine (VYVANSE) 30 MG capsule, Take 1 capsule (30 mg total) by mouth daily., Disp: 30 capsule, Rfl: 0   [START ON 02/13/2023] lisdexamfetamine (VYVANSE) 30 MG capsule, Take 1 capsule (30 mg total) by mouth daily., Disp: 30 capsule, Rfl: 0   Vitamin D, Ergocalciferol, (DRISDOL) 1.25 MG (50000 UNIT) CAPS capsule, TAKE 1 CAPSULE BY MOUTH ONCE A WEEK ON  THURSDAY, Disp: 12 capsule, Rfl: 0 Social History   Socioeconomic History   Marital status: Married    Spouse name: Tabitha   Number of children: 2   Years of education: Not on file   Highest education level: Not on file  Occupational History   Occupation: Firefighter    Employer: [employer]   Occupation: truck driver  Tobacco Use   Smoking status: Never   Smokeless tobacco: Former    Types: Snuff    Quit date: 12/16/2020   Tobacco comments:    quitting snuff. whinning off   Vaping Use   Vaping Use: Never used  Substance and Sexual Activity   Alcohol use: Not Currently  Drug use: No   Sexual activity: Not Currently  Other  Topics Concern   Not on file  Social History Narrative   Not on file   Social Determinants of Health   Financial Resource Strain: Not on file  Food Insecurity: Not on file  Transportation Needs: Not on file  Physical Activity: Not on file  Stress: Not on file  Social Connections: Not on file  Intimate Partner Violence: Not on file   Family History  Problem Relation Age of Onset   Leukemia Father 75   Colon polyps Father    Clotting disorder Father    Heart disease Father    Cancer Father    Depression Father    Sleep apnea Father    Obesity Father    Seizures Mother    Irritable bowel syndrome Mother    Thyroid disease Mother    Depression Mother    Anxiety disorder Mother    Bipolar disorder Brother    ADD / ADHD Brother    Diabetes Paternal Grandmother    Ulcerative colitis Paternal Aunt    Colon cancer Neg Hx    Liver cancer Neg Hx    Esophageal cancer Neg Hx    Stomach cancer Neg Hx    Pancreatic cancer Neg Hx     Objective: Office vital signs reviewed. BP 127/74   Pulse 98   Temp 98.7 F (37.1 C)   Ht '5\' 10"'$  (1.778 m)   Wt (!) 300 lb 9.6 oz (136.4 kg)   SpO2 100%   BMI 43.13 kg/m   Physical Exam General: Awake, alert, well nourished, No acute distress Cardio: regular rate and rhythm, S1S2 heard, no murmurs appreciated Pulm: clear to auscultation bilaterally, no wheezes, rhonchi or rales; normal work of breathing on room air GI: soft, non-tender, non-distended, bowel sounds present x4 Extremities: warm, well perfused, No edema, cyanosis or clubbing; +1 pulses bilaterally Skin: dry; intact. Approximately 4-inch erythematous patch underneath pannus, located approximately midline, extending laterally. Mild scaling and signs of scarring.   Assessment/ Plan: 37 y.o. male   Patient continues to tolerate his medications well, no changes at this time. Discussed with the patient that the rash is most likely intertrigo dermatitis secondary to skin friction,  heat, and moisture. Less likely contact dermatitis or tinea given the location of the rash. Notes some pruritus in summer. Advised that the patient keep the area cool and dry. Given that the rash has been refractory to these measures, particularly in the warmer months, I will prescribe ketoconazole ointment to address likely fungal growth. Discussed that patient should message me when he utilizes the cream as well as send pictures in the chart of its progression and flares.   Thyroid labs were normal at the last visit. Discussed that at the patient's next visit in April, we will do fasting labs. Patient expressed understanding.  Orders Placed This Encounter  Procedures   ToxASSURE Select 13 (MW), Urine    Current Outpatient Medications:    Calcium Carbonate (CALCIUM 600 PO), Take 600 mg by mouth in the morning, at noon, and at bedtime., Disp: , Rfl:    cetirizine (ZYRTEC) 10 MG tablet, Take 10 mg by mouth daily., Disp: , Rfl:    cyclobenzaprine (FLEXERIL) 10 MG tablet, Take 1 tablet by mouth three times daily as needed for muscle spasm, Disp: 90 tablet, Rfl: 6   docusate sodium (COLACE) 250 MG capsule, Take 250 mg by mouth daily., Disp: , Rfl:    EPINEPHrine  0.3 mg/0.3 mL IJ SOAJ injection, Inject 0.3 mg into the muscle as needed for anaphylaxis., Disp: , Rfl:    gabapentin (NEURONTIN) 300 MG capsule, TAKE 1 CAPSULE BY MOUTH THREE TIMES DAILY, Disp: 90 capsule, Rfl: 2   lamoTRIgine (LAMICTAL) 25 MG tablet, Take 2 tablets by mouth twice daily, Disp: 360 tablet, Rfl: 3   levothyroxine (SYNTHROID) 75 MCG tablet, TAKE 1 TABLET BY MOUTH ONCE DAILY BEFORE BREAKFAST, Disp: 90 tablet, Rfl: 2   lisdexamfetamine (VYVANSE) 30 MG capsule, Take 1 capsule (30 mg total) by mouth daily., Disp: 30 capsule, Rfl: 0   lisdexamfetamine (VYVANSE) 30 MG capsule, Take 1 capsule (30 mg total) by mouth daily., Disp: 30 capsule, Rfl: 0   lisdexamfetamine (VYVANSE) 30 MG capsule, Take 1 capsule (30 mg total) by  mouth daily., Disp: 30 capsule, Rfl: 0   Multiple Vitamins-Minerals (BARIATRIC MULTIVITAMINS/IRON PO), Take 1 tablet by mouth in the morning and at bedtime., Disp: , Rfl:    ondansetron (ZOFRAN-ODT) 4 MG disintegrating tablet, Dissolve 1 tablet (4 mg total) by mouth every 6 (six) hours as needed for nausea or vomiting., Disp: 20 tablet, Rfl: 0   pantoprazole (PROTONIX) 40 MG tablet, TAKE 1 TABLET BY MOUTH ONCE DAILY AS NEEDED FOR  HEARTBURN  OR  INDIGESTION, Disp: 90 tablet, Rfl: 0   Rimegepant Sulfate (NURTEC) 75 MG TBDP, Take 1 tablet by mouth daily as needed (migraine)., Disp: 4 tablet, Rfl: 0   Vitamin D, Ergocalciferol, (DRISDOL) 1.25 MG (50000 UNIT) CAPS capsule, TAKE 1 CAPSULE BY MOUTH ONCE A WEEK ON  THURSDAY, Disp: 12 capsule, Rfl: 0   Meds ordered this encounter  Medications   EPINEPHrine 0.3 mg/0.3 mL IJ SOAJ injection    Sig: Inject 0.3 mg into the muscle as needed for anaphylaxis.    Dispense:  1 each    Refill:  0   gabapentin (NEURONTIN) 300 MG capsule    Sig: Take 1 capsule (300 mg total) by mouth 3 (three) times daily.    Dispense:  270 capsule    Refill:  3   levothyroxine (SYNTHROID) 75 MCG tablet    Sig: Take 1 tablet (75 mcg total) by mouth daily before breakfast.    Dispense:  90 tablet    Refill:  3   lamoTRIgine (LAMICTAL) 25 MG tablet    Sig: Take 2 tablets (50 mg total) by mouth 2 (two) times daily.    Dispense:  360 tablet    Refill:  3   Vitamin D, Ergocalciferol, (DRISDOL) 1.25 MG (50000 UNIT) CAPS capsule    Sig: TAKE 1 CAPSULE BY MOUTH ONCE A WEEK ON  THURSDAY    Dispense:  12 capsule    Refill:  0   lisdexamfetamine (VYVANSE) 30 MG capsule    Sig: Take 1 capsule (30 mg total) by mouth daily.    Dispense:  30 capsule    Refill:  0   lisdexamfetamine (VYVANSE) 30 MG capsule    Sig: Take 1 capsule (30 mg total) by mouth daily.    Dispense:  30 capsule    Refill:  0   lisdexamfetamine (VYVANSE) 30 MG capsule    Sig: Take 1 capsule (30 mg total)  by mouth daily.    Dispense:  30 capsule    Refill:  0   ketoconazole (NIZORAL) 2 % cream    Sig: Apply 1 Application topically daily. X10-14 days per flare up of fungal rash    Dispense:  60 g  Refill:  [place], [place]3

## 2022-12-17 ENCOUNTER — Telehealth: Payer: Self-pay

## 2022-12-17 NOTE — Telephone Encounter (Signed)
Judi Cong (Key: [place]) PA Case ID #: [id number] Rx #: [id number] Need Help? Call us at [phone] Status sent iconSent to Plan today Drug Pantoprazole Sodium '40MG'$  dr tablets ePA cloud Child psychotherapist Electronic PA Form [phone] NCPDP) Original Claim Info [address] LIMIT EXCD PA REQ`D CALL [id number] MAX QTY OF 90.000 IN 365 DAYSQUANTITY REMAINING .000NEXT FILL DT [id number], LAST FILL [id number] '@[facility]'$  PHARMAC,PH# [id number](ELFYBOFB HELP DESK [phone]

## 2022-12-18 LAB — TOXASSURE SELECT 13 (MW), URINE

## 2022-12-19 NOTE — Telephone Encounter (Signed)
We're pleased to let you know that we've approved your or your doctor's request for coverage for Pantoprazole Sodium '40MG'$  OR TBEC. You can now fill your prescription, and it will be covered according to your plan.

## 2022-12-29 NOTE — Progress Notes (Deleted)
HPI  male nonsmoker followed for OSA, allergic rhinitis, complicated by history of bipolar disorder, GERD, obesity NPSG 08/03/12-  AHI 24.1/ hr, moderate OSA.  ==================================================================================   12/31/21- 37 year old male nonsmoker followed for OSA, allergic rhinitis, complicated by history of bipolar disorder, Schizophrenia, ADHD, GERD, obesity, Allergic Rhinitis, IBS, Hypothyroid, Bariatric Surgery 2022 ([facility]), NPSG 10/07/21- AHI 10.7/ hr, desaturation to 60%, body weight 320 lbs -Vyvanse 20(Dr Gottschalk)    CPAP 20/Adapt Download-compliance unable to use until we adjust pressure after bariatric sgy Body weight today-305 lbs Covid vax-none Flu vax-none We reviewed most recent sleep study.  He is comfortable continuing CPAP but says his current pressure setting at 20 is too high after bariatric surgery.  Needs to maintain commercial driver's license.  12/31/22- 37 year old male nonsmoker followed for OSA, allergic rhinitis, complicated by history of bipolar disorder, Schizophrenia, ADHD, GERD, Obesity, Allergic Rhinitis, IBS, Hypothyroid, Bariatric Surgery 2022 ([facility]), -Vyvanse 30(Dr Gottschalk)    CPAP auto 4-10/Adapt Download-compliance - Body weight today- Covid vax-none Flu vax-none  CXR 07/29/22- IMPRESSION: No active cardiopulmonary disease.  ROS-see HPI   + = positive Constitutional:   + weight loss, night sweats, fevers, chills, fatigue, lassitude. HEENT:   No-  headaches, difficulty swallowing, tooth/dental problems, sore throat,       No-  sneezing, itching, ear ache, +nasal congestion, post nasal drip,  CV:  No-  cardiac chest pain, orthopnea, PND, swelling in lower extremities, anasarca,  dizziness, palpitations Resp: + shortness of breath with exertion or at rest.              No-   productive cough,  No non-productive cough,  No- coughing up of blood.              No-   change in color of mucus.  No- wheezing.   Skin: No-    rash or lesions. GI:  + heartburn, indigestion, no-abdominal pain, nausea, vomiting,  GU:  MS:  No-   joint pain or swelling. + Back pain Neuro-     nothing unusual Psych:   See HPI-No- change in mood or affect. + depression or anxiety.  No memory loss.  OBJ- Physical Exam General- Alert, Oriented, Affect-appropriate, Distress- none acute, + still obese Skin- rash-none, lesions- none, excoriation- none Lymphadenopathy- none Head- atraumatic            Eyes- Gross vision intact, PERRLA, conjunctivae and secretions clear            Ears- Hearing, canals-normal            Nose- Clear, no-Septal dev, mucus, polyps, erosion, perforation             Throat- Mallampati III , mucosa-brown stained, not dry , drainage- none, tonsils- 2+ Neck- flexible , trachea midline, no stridor , thyroid nl, carotid no bruit Chest - symmetrical excursion , unlabored           Heart/CV- RRR , no murmur , no gallop  , no rub, nl s1 s2                           - JVD- none , edema- none, stasis changes- none, varices- none           Lung- clear to P&A, wheeze- none, cough- none , dullness-none, rub- none           Chest wall-  Abd-  Br/ Gen/ Rectal- Not done, not indicated Extrem- cyanosis- none,  clubbing, none, atrophy- none, strength- nl Neuro- grossly intact to observation

## 2022-12-31 ENCOUNTER — Ambulatory Visit: Payer: Medicaid Other | Admitting: Internal Medicine

## 2023-01-09 DIAGNOSIS — J029 Acute pharyngitis, unspecified: Secondary | ICD-10-CM | POA: Diagnosis not present

## 2023-01-09 DIAGNOSIS — H6692 Otitis media, unspecified, left ear: Secondary | ICD-10-CM | POA: Diagnosis not present

## 2023-01-23 ENCOUNTER — Encounter: Payer: Self-pay | Admitting: Radiology

## 2023-03-03 ENCOUNTER — Telehealth: Payer: Self-pay | Admitting: Family Medicine

## 2023-03-03 DIAGNOSIS — F902 Attention-deficit hyperactivity disorder, combined type: Secondary | ICD-10-CM

## 2023-03-03 MED ORDER — LISDEXAMFETAMINE DIMESYLATE 30 MG PO CAPS: 30.0000 mg | ORAL_CAPSULE | Freq: Every day | ORAL | 0 refills | Status: DC

## 2023-03-03 NOTE — Telephone Encounter (Signed)
done

## 2023-03-24 ENCOUNTER — Other Ambulatory Visit: Payer: Self-pay | Admitting: Family Medicine

## 2023-03-24 DIAGNOSIS — M5126 Other intervertebral disc displacement, lumbar region: Secondary | ICD-10-CM

## 2023-03-25 ENCOUNTER — Encounter: Payer: Self-pay | Admitting: Family Medicine

## 2023-03-25 ENCOUNTER — Ambulatory Visit (INDEPENDENT_AMBULATORY_CARE_PROVIDER_SITE_OTHER): Payer: 59 | Admitting: Family Medicine

## 2023-03-25 VITALS — BP 118/79 | HR 67 | Temp 98.3°F | Ht 70.0 in | Wt 297.0 lb

## 2023-03-25 DIAGNOSIS — Z0001 Encounter for general adult medical examination with abnormal findings: Secondary | ICD-10-CM | POA: Diagnosis not present

## 2023-03-25 DIAGNOSIS — E559 Vitamin D deficiency, unspecified: Secondary | ICD-10-CM

## 2023-03-25 DIAGNOSIS — F3177 Bipolar disorder, in partial remission, most recent episode mixed: Secondary | ICD-10-CM

## 2023-03-25 DIAGNOSIS — B372 Candidiasis of skin and nail: Secondary | ICD-10-CM

## 2023-03-25 DIAGNOSIS — Z6841 Body Mass Index (BMI) 40.0 and over, adult: Secondary | ICD-10-CM | POA: Diagnosis not present

## 2023-03-25 DIAGNOSIS — Z0000 Encounter for general adult medical examination without abnormal findings: Secondary | ICD-10-CM

## 2023-03-25 DIAGNOSIS — F902 Attention-deficit hyperactivity disorder, combined type: Secondary | ICD-10-CM | POA: Diagnosis not present

## 2023-03-25 DIAGNOSIS — E039 Hypothyroidism, unspecified: Secondary | ICD-10-CM

## 2023-03-25 LAB — CMP14+EGFR

## 2023-03-25 LAB — LIPID PANEL

## 2023-03-25 LAB — BAYER DCA HB A1C WAIVED: HB A1C (BAYER DCA - WAIVED): 4.9 % (ref 4.8–5.6)

## 2023-03-25 LAB — CBC
MCHC: 33 g/dL (ref 31.5–35.7)
Platelets: 211 10*3/uL (ref 150–450)
RBC: 4.87 x10E6/uL (ref 4.14–5.80)
RDW: 12.7 % (ref 11.6–15.4)

## 2023-03-25 LAB — VITAMIN D 25 HYDROXY (VIT D DEFICIENCY, FRACTURES)

## 2023-03-25 LAB — T4, FREE

## 2023-03-25 LAB — TSH

## 2023-03-25 MED ORDER — AMPHETAMINE-DEXTROAMPHETAMINE 20 MG PO TABS
20.0000 mg | ORAL_TABLET | Freq: Every day | ORAL | 0 refills | Status: DC
Start: 2023-04-24 — End: 2023-06-25

## 2023-03-25 MED ORDER — AMPHETAMINE-DEXTROAMPHETAMINE 20 MG PO TABS
20.0000 mg | ORAL_TABLET | Freq: Every day | ORAL | 0 refills | Status: DC
Start: 2023-03-25 — End: 2023-06-25

## 2023-03-25 MED ORDER — AMPHETAMINE-DEXTROAMPHETAMINE 20 MG PO TABS
20.0000 mg | ORAL_TABLET | Freq: Every day | ORAL | 0 refills | Status: DC
Start: 2023-05-24 — End: 2023-06-25

## 2023-03-25 NOTE — Progress Notes (Signed)
[name] is a 37 y.o. male presents to office today for annual physical exam examination.    Concerns today include: 1. Rash Continues to have intertrigo rash underneath his pannus.  This is requiring treatment at least twice per month and the ketoconazole does help but again is requiring it frequently.  He admits that this area sweats and shakes quite a bit, particularly given his nature of work, where he is seated most of the time.  He is now working for [employer] and is really happy with the new job.  Occupation: Education officer, environmental, Substance use: none Diet: typical [place], Exercise: no structured Last eye exam: UTD Last dental exam: UTD Refills needed today: all Immunizations needed: Immunization History  Administered Date(s) Administered   DTaP 02/14/1986, 04/25/1986, 08/15/1986, 09/05/1987, 02/09/1991   HIB (PRP-OMP) 01/23/1988   Hepatitis B 09/29/1998, 02/07/1999   IPV 02/14/1986, 04/25/1986, 09/05/1987, 02/09/1991   Influenza Split 08/25/2010, 08/20/2012, 08/25/2014   MMR 02/28/1987, 02/09/1991     Past Medical History:  Diagnosis Date   Abscess    Right forearm healing   ADHD (attention deficit hyperactivity disorder)    Anal fissure    Anxiety    Arthritis    back, shoulders, ankles, knees   Back pain    Bipolar disorder (HCC)    Blood in stool 04/06/2009   Centricity Description: HEMATOCHEZIA Qualifier: Diagnosis of  By: De Blanch  Centricity Description: MELENA Qualifier: Diagnosis of  By: De Blanch    Chest pain    Chronic lower back pain    Class 3 severe obesity with serious comorbidity and body mass index (BMI) of 50.0 to 59.9 in adult (HCC) 05/03/2012   Constipation    Depression    Esophagitis    Distal esophageal erosions consistent with mild erosive  reflux esophagitis 12/2008 by EGD    External hemorrhoids    Gastric polyps    Gastric ulcer    GERD (gastroesophageal reflux disease)    Hepatic steatosis     Hiatal hernia    History of stomach ulcers    Hx MRSA infection 06/2007   right thigh   Hx of colonic polyps    Hypercholesteremia    denies   Hypothyroidism    Internal hemorrhoids    Irritable bowel syndrome (IBS)    Lower abdominal pain 03/07/2009   Qualifier: Diagnosis of  By: Minna Merritts     Obesity    Occult GI bleeding 12/2008   Trivial upper GI bleed/uncontrolled GERD by upper endoscopy 12/2008, normal f/u endoscopy 03/2009 with SBCE at that time   OSA on CPAP    Pain management    Pneumonia 09/01/2015   "on ATB still" (09/04/2015)   S/P colonoscopy 11/10, 10/08   Dr Elmer Ramp   Schizophrenia [facility])    Scrotal pain 04/26/2022   Sleep apnea    Stomach ulcer    Syncope and collapse 05/03/2012   Thyroid function test abnormal    Noted in 2011 discharge   Tobacco dipper    Wears contact lenses    Social History   Socioeconomic History   Marital status: Married    Spouse name: Tabitha   Number of children: 2   Years of education: Not on file   Highest education level: 12th grade  Occupational History   Occupation: Designer, television/film set    Employer: [employer]   Occupation: truck Hospital doctor  Tobacco Use   Smoking status: Never   Smokeless  tobacco: Former    Types: Snuff    Quit date: 12/16/2020   Tobacco comments:    quitting snuff. whinning off   Vaping Use   Vaping Use: Never used  Substance and Sexual Activity   Alcohol use: Not Currently   Drug use: No   Sexual activity: Not Currently  Other Topics Concern   Not on file  Social History Narrative   Not on file   Social Determinants of Health   Financial Resource Strain: Low Risk  (03/25/2023)   Overall Financial Resource Strain (CARDIA)    Difficulty of Paying Living Expenses: Not very hard  Food Insecurity: No Food Insecurity (03/25/2023)   Hunger Vital Sign    Worried About Running Out of Food in the Last Year: Never true    Ran Out of Food in the Last Year: Never true  Transportation Needs: No  Transportation Needs (03/25/2023)   PRAPARE - Administrator, Civil Service (Medical): No    Lack of Transportation (Non-Medical): No  Physical Activity: Sufficiently Active (03/25/2023)   Exercise Vital Sign    Days of Exercise per Week: 7 days    Minutes of Exercise per Session: 150+ min  Stress: No Stress Concern Present (03/25/2023)   [employer] of Occupational Health - Occupational Stress Questionnaire    Feeling of Stress : Only a little  Social Connections: Moderately Integrated (03/25/2023)   Social Connection and Isolation Panel [NHANES]    Frequency of Communication with Friends and Family: More than three times a week    Frequency of Social Gatherings with Friends and Family: More than three times a week    Attends Religious Services: More than 4 times per year    Active Member of [employer] or Organizations: No    Attends Engineer, structural: Not on file    Marital Status: Married  Catering manager Violence: Not on file   Past Surgical History:  Procedure Laterality Date   ANKLE FRACTURE SURGERY Left ~ 2008   BACK SURGERY     BIOPSY  11/02/2019   Procedure: BIOPSY;  Surgeon: Shellia Cleverly, DO;  Location: [facility];  Service: Gastroenterology;;  EGD and Colon   BIOPSY  06/07/2020   Procedure: BIOPSY;  Surgeon: Shellia Cleverly, DO;  Location: [facility];  Service: Gastroenterology;;   BRAVO PH STUDY N/A 06/07/2020   Procedure: BRAVO PH STUDY on PPI;  Surgeon: Shellia Cleverly, DO;  Location: [facility];  Service: Gastroenterology;  Laterality: N/A;   COLONOSCOPY  10/10/2009   anal papilla otherwise normal   COLONOSCOPY WITH PROPOFOL N/A 11/02/2019   Procedure: COLONOSCOPY WITH PROPOFOL;  Surgeon: Shellia Cleverly, DO;  Location: [facility];  Service: Gastroenterology;  Laterality: N/A;   ESOPHAGOGASTRODUODENOSCOPY  04/07/2009   Normal esophagus, small hiatal hernia   ESOPHAGOGASTRODUODENOSCOPY  01/09/2009   Distal esophageal  erosions consistent with mild erosive reflux esophagitis, otherwise normal esophagus, small hiatal herniaotherwise normal stomach, D1-D2    ESOPHAGOGASTRODUODENOSCOPY  08/26/2007   Normal esophagus, a small hiatal/hernia, otherwise normal stomach D1 through D3   ESOPHAGOGASTRODUODENOSCOPY (EGD) WITH PROPOFOL N/A 11/02/2019   Procedure: ESOPHAGOGASTRODUODENOSCOPY (EGD) WITH PROPOFOL;  Surgeon: Shellia Cleverly, DO;  Location: [facility];  Service: Gastroenterology;  Laterality: N/A;   ESOPHAGOGASTRODUODENOSCOPY (EGD) WITH PROPOFOL N/A 06/07/2020   Procedure: ESOPHAGOGASTRODUODENOSCOPY (EGD) WITH PROPOFOL;  Surgeon: Shellia Cleverly, DO;  Location: [facility];  Service: Gastroenterology;  Laterality: N/A;   FRACTURE SURGERY     GASTRIC BYPASS  GASTRIC ROUX-EN-Y N/A 06/05/2021   Procedure: LAPAROSCOPIC ROUX-EN-Y GASTRIC BYPASS WITH UPPER ENDOSCOPY;  Surgeon: Berna Bue, MD;  Location: [facility];  Service: General;  Laterality: N/A;   ileocolonoscopy  08/26/2007    A normal rectum, colon, and terminal ileum   INCISION AND DRAINAGE  09/04/2015   "reopened my back incsion"   LUMBAR LAMINECTOMY/DECOMPRESSION MICRODISCECTOMY Left 09/23/2017   Procedure: Microdiscectomy - left - Lumbar four-Lumbar five;  Surgeon: Julio Sicks, MD;  [facility];  Service: Neurosurgery;  Laterality: Left;   LUMBAR MICRODISCECTOMY  08/21/2015   LUMBAR MICRODISCECTOMY Left 09/23/2017   L4-5   LUMBAR WOUND DEBRIDEMENT N/A 09/04/2015   Procedure: LUMBAR WOUND DEBRIDEMENT;  Surgeon: Lisbeth Renshaw, MD;  Location: [facility];  Service: Neurosurgery;  Laterality: N/A;   right side sugery     enlarged lymph node gland removed under arm pit age 33-5 years   SHOULDER ARTHROSCOPY WITH DISTAL CLAVICLE RESECTION Right 11/22/2021   Procedure: SHOULDER ARTHROSCOPY WITH SUBACROMIAL DECOMPRESSION AND DISTAL CLAVICLE EXCISION;  Surgeon: Oliver Barre, MD;  Location: [facility];  Service: Orthopedics;  Laterality:  Right;   Small bowel capsule  04/11/2009    normal throughout   VASECTOMY     Family History  Problem Relation Age of Onset   Leukemia Father 80   Colon polyps Father    Clotting disorder Father    Heart disease Father    Cancer Father    Depression Father    Sleep apnea Father    Obesity Father    Seizures Mother    Irritable bowel syndrome Mother    Thyroid disease Mother    Depression Mother    Anxiety disorder Mother    Bipolar disorder Brother    ADD / ADHD Brother    Diabetes Paternal Grandmother    Ulcerative colitis Paternal Aunt    Colon cancer Neg Hx    Liver cancer Neg Hx    Esophageal cancer Neg Hx    Stomach cancer Neg Hx    Pancreatic cancer Neg Hx     Current Outpatient Medications:    Calcium Carbonate (CALCIUM 600 PO), Take 600 mg by mouth in the morning, at noon, and at bedtime., Disp: , Rfl:    cetirizine (ZYRTEC) 10 MG tablet, Take 10 mg by mouth daily., Disp: , Rfl:    cyclobenzaprine (FLEXERIL) 10 MG tablet, Take 1 tablet by mouth three times daily as needed for muscle spasm, Disp: 90 tablet, Rfl: 6   docusate sodium (COLACE) 250 MG capsule, Take 250 mg by mouth daily., Disp: , Rfl:    EPINEPHrine 0.3 mg/0.3 mL IJ SOAJ injection, Inject 0.3 mg into the muscle as needed for anaphylaxis., Disp: 1 each, Rfl: 0   gabapentin (NEURONTIN) 300 MG capsule, Take 1 capsule (300 mg total) by mouth 3 (three) times daily., Disp: 270 capsule, Rfl: 3   ketoconazole (NIZORAL) 2 % cream, Apply 1 Application topically daily. X10-14 days per flare up of fungal rash, Disp: 60 g, Rfl: 1   lamoTRIgine (LAMICTAL) 25 MG tablet, Take 2 tablets (50 mg total) by mouth 2 (two) times daily., Disp: 360 tablet, Rfl: 3   levothyroxine (SYNTHROID) 75 MCG tablet, Take 1 tablet (75 mcg total) by mouth daily before breakfast., Disp: 90 tablet, Rfl: 3   lisdexamfetamine (VYVANSE) 30 MG capsule, Take 1 capsule (30 mg total) by mouth daily., Disp: 30 capsule, Rfl: 0   lisdexamfetamine  (VYVANSE) 30 MG capsule, Take 1 capsule (30 mg total)  by mouth daily., Disp: 30 capsule, Rfl: 0   lisdexamfetamine (VYVANSE) 30 MG capsule, Take 1 capsule (30 mg total) by mouth daily., Disp: 30 capsule, Rfl: 0   Multiple Vitamins-Minerals (BARIATRIC MULTIVITAMINS/IRON PO), Take 1 tablet by mouth in the morning and at bedtime., Disp: , Rfl:    ondansetron (ZOFRAN-ODT) 4 MG disintegrating tablet, Dissolve 1 tablet (4 mg total) by mouth every 6 (six) hours as needed for nausea or vomiting., Disp: 20 tablet, Rfl: 0   pantoprazole (PROTONIX) 40 MG tablet, TAKE 1 TABLET BY MOUTH ONCE DAILY AS NEEDED FOR  HEARTBURN  OR  INDIGESTION, Disp: 90 tablet, Rfl: 0   Rimegepant Sulfate (NURTEC) 75 MG TBDP, Take 1 tablet by mouth daily as needed (migraine)., Disp: 4 tablet, Rfl: 0   Vitamin D, Ergocalciferol, (DRISDOL) 1.25 MG (50000 UNIT) CAPS capsule, TAKE 1 CAPSULE BY MOUTH ONCE A WEEK ON  THURSDAY, Disp: 12 capsule, Rfl: 0  Allergies  Allergen Reactions   Ibuprofen Other (See Comments)    Makes ulcers bleed   Influenza Vaccines Shortness Of Breath and Other (See Comments)    Rash and unable to breathe well   Tylenol [Acetaminophen] Hives   Bee Venom Swelling    SWELLING REACTION UNSPECIFIED    Tramadol Itching     ROS: Review of Systems A comprehensive review of systems was negative except for: Gastrointestinal: positive for constipation and diarrhea Integument/breast: positive for rash Behavioral/Psych: positive for bad mood and mood swings    Physical exam BP 118/79   Pulse 67   Temp 98.3 F (36.8 C)   Ht 5\' 10"  (1.778 m)   Wt 297 lb (134.7 kg)   SpO2 100%   BMI 42.62 kg/m  General appearance: alert, cooperative, appears stated age, no distress, and morbidly obese Head: Normocephalic, without obvious abnormality, atraumatic Eyes: negative findings: lids and lashes normal, conjunctivae and sclerae normal, corneas clear, and pupils equal, round, reactive to light and accomodation Ears:  normal TM's and external ear canals both ears Nose: Nares normal. Septum midline. Mucosa normal. No drainage or sinus tenderness. Throat: lips, mucosa, and tongue normal; teeth and gums normal Neck: no adenopathy, supple, symmetrical, trachea midline, and thyroid not enlarged, symmetric, no tenderness/mass/nodules Back: symmetric, no curvature. ROM normal. No CVA tenderness. Lungs: clear to auscultation bilaterally Chest wall: no tenderness Heart: regular rate and rhythm, S1, S2 normal, no murmur, click, rub or gallop Abdomen:  obese, soft, NT/ND Extremities: extremities normal, atraumatic, no cyanosis or edema Pulses: 2+ and symmetric Skin:  erythematous, macerated skin noted under abdominal pannus Lymph nodes: Cervical, supraclavicular, and axillary nodes normal. Neurologic: Grossly normal Psych: pleasant and interactive. Does not appear to be responding to internal stimuli      03/25/2023    9:00 AM 12/16/2022    9:18 AM 09/10/2022    9:08 AM  Depression screen PHQ 2/9  Decreased Interest 0 0 0  Down, Depressed, Hopeless 0 0 0  PHQ - 2 Score 0 0 0  Altered sleeping 0 0   Tired, decreased energy 0 0   Change in appetite 0 1   Feeling bad or failure about yourself  0 0   Trouble concentrating 0 0   Moving slowly or fidgety/restless 0 0   Suicidal thoughts 0 0   PHQ-9 Score 0 1   Difficult doing work/chores Not difficult at all Not difficult at all       03/25/2023    9:01 AM 12/16/2022    9:19 AM 09/10/2022  9:08 AM 06/10/2022    8:11 AM  GAD 7 : Generalized Anxiety Score  Nervous, Anxious, on Edge 0 1 0 0  Control/stop worrying 0 0 0 0  Worry too much - different things 0 0 0 0  Trouble relaxing 0 0 0 0  Restless 0 0 0 0  Easily annoyed or irritable 0 1 0 0  Afraid - awful might happen 0 0 0 0  Total GAD 7 Score 0 2 0 0  Anxiety Difficulty  Not difficult at all Not difficult at all Not difficult at all   Assessment/ Plan: Magdalen Spatz here for annual physical  exam.   Annual physical exam  Obesity, morbid, BMI 40.0-49.9 (HCC) - Plan: CMP14+EGFR, Lipid Panel, Bayer DCA Hb A1c Waived  Attention deficit hyperactivity disorder (ADHD), combined type - Plan: CMP14+EGFR, amphetamine-dextroamphetamine (ADDERALL) 20 MG tablet, amphetamine-dextroamphetamine (ADDERALL) 20 MG tablet, amphetamine-dextroamphetamine (ADDERALL) 20 MG tablet  Bipolar disorder, in partial remission, most recent episode mixed (HCC) - Plan: CMP14+EGFR, CBC  Vitamin D deficiency - Plan: VITAMIN D 25 Hydroxy (Vit-D Deficiency, Fractures)  Acquired hypothyroidism - Plan: TSH, T4, Free  Candidal intertrigo  Weight has stalled.  Check fasting labs.  Check thyroid level.  Vyvanse on backorder so will resume use of IR Adderall given bariatric surgery.  The Narcotic Database has been reviewed.  There were no red flags.  Last fill 11/2022. UTD on UDS and CSC  Mood somewhat not well controlled but maybe due to uncontrolled ADHD symptoms.  If persists at next visit, pt amenable to seeing psychiatry for med adjustment  Candidal intertrigo noted under pannus today.  No evidence of secondary bacterial infection.  Continue Ketoconazole prn.  Follow up with plastic surgery given persistent nature of rash, requiring frequent treatment.  Likely needs skin surgery at this point.  Counseled on healthy lifestyle choices, including diet (rich in fruits, vegetables and lean meats and low in salt and simple carbohydrates) and exercise (at least 30 minutes of moderate physical activity daily).  Patient to follow up in 40m for ADHD  Eragon Hammond M. Nadine Counts, DO

## 2023-03-25 NOTE — Patient Instructions (Signed)
Preventive Care 21-37 Years Old, Male Preventive care refers to lifestyle choices and visits with your health care provider that can promote health and wellness. Preventive care visits are also called wellness exams. What can I expect for my preventive care visit? Counseling During your preventive care visit, your health care provider may ask about your: Medical history, including: Past medical problems. Family medical history. Current health, including: Emotional well-being. Home life and relationship well-being. Sexual activity. Lifestyle, including: Alcohol, nicotine or tobacco, and drug use. Access to firearms. Diet, exercise, and sleep habits. Safety issues such as seatbelt and bike helmet use. Sunscreen use. Work and work environment. Physical exam Your health care provider may check your: Height and weight. These may be used to calculate your BMI (body mass index). BMI is a measurement that tells if you are at a healthy weight. Waist circumference. This measures the distance around your waistline. This measurement also tells if you are at a healthy weight and may help predict your risk of certain diseases, such as type 2 diabetes and high blood pressure. Heart rate and blood pressure. Body temperature. Skin for abnormal spots. What immunizations do I need?  Vaccines are usually given at various ages, according to a schedule. Your health care provider will recommend vaccines for you based on your age, medical history, and lifestyle or other factors, such as travel or where you work. What tests do I need? Screening Your health care provider may recommend screening tests for certain conditions. This may include: Lipid and cholesterol levels. Diabetes screening. This is done by checking your blood sugar (glucose) after you have not eaten for a while (fasting). Hepatitis B test. Hepatitis C test. HIV (human immunodeficiency virus) test. STI (sexually transmitted infection)  testing, if you are at risk. Talk with your health care provider about your test results, treatment options, and if necessary, the need for more tests. Follow these instructions at home: Eating and drinking  Eat a healthy diet that includes fresh fruits and vegetables, whole grains, lean protein, and low-fat dairy products. Drink enough fluid to keep your urine pale yellow. Take vitamin and mineral supplements as recommended by your health care provider. Do not drink alcohol if your health care provider tells you not to drink. If you drink alcohol: Limit how much you have to 0-2 drinks a day. Know how much alcohol is in your drink. In the [place] one drink equals one 12 oz bottle of beer (355 mL), one 5 oz glass of wine (148 mL), or one 1 oz glass of hard liquor (44 mL). Lifestyle Brush your teeth every morning and night with fluoride toothpaste. Floss one time each day. Exercise for at least 30 minutes 5 or more days each week. Do not use any products that contain nicotine or tobacco. These products include cigarettes, chewing tobacco, and vaping devices, such as e-cigarettes. If you need help quitting, ask your health care provider. Do not use drugs. If you are sexually active, practice safe sex. Use a condom or other form of protection to prevent STIs. Find healthy ways to manage stress, such as: Meditation, yoga, or listening to music. Journaling. Talking to a trusted person. Spending time with friends and family. Minimize exposure to UV radiation to reduce your risk of skin cancer. Safety Always wear your seat belt while driving or riding in a vehicle. Do not drive: If you have been drinking alcohol. Do not ride with someone who has been drinking. If you have been using any mind-altering substances   or drugs. While texting. When you are tired or distracted. Wear a helmet and other protective equipment during sports activities. If you have firearms in your house, make sure you  follow all gun safety procedures. Seek help if you have been physically or sexually abused. What's next? Go to your health care provider once a year for an annual wellness visit. Ask your health care provider how often you should have your eyes and teeth checked. Stay up to date on all vaccines. This information is not intended to replace advice given to you by your health care provider. Make sure you discuss any questions you have with your health care provider. Document Revised: 05/09/2021 Document Reviewed: 05/09/2021 Elsevier Patient Education  2023 [employer]  

## 2023-03-26 LAB — LIPID PANEL
Cholesterol, Total: 134 mg/dL (ref 100–199)
HDL: 60 mg/dL (ref >39)
LDL Chol Calc (NIH): 59 mg/dL (ref 0–99)
Triglycerides: 74 mg/dL (ref 0–149)

## 2023-03-26 LAB — CMP14+EGFR
ALT: 18 IU/L (ref 0–44)
AST: 18 IU/L (ref 0–40)
Albumin: 4.3 g/dL (ref 4.1–5.1)
Alkaline Phosphatase: 80 IU/L (ref 44–121)
BUN: 10 mg/dL (ref 6–20)
Bilirubin Total: 0.7 mg/dL (ref 0.0–1.2)
CO2: 24 mmol/L (ref 20–29)
Calcium: 9.3 mg/dL (ref 8.7–10.2)
Sodium: 138 mmol/L (ref 134–144)
Total Protein: 6.1 g/dL (ref 6.0–8.5)
eGFR: 108 mL/min/{1.73_m2} (ref >59)

## 2023-03-26 LAB — CBC
Hematocrit: 44.2 % (ref 37.5–51.0)
Hemoglobin: 14.6 g/dL (ref 13.0–17.7)
MCH: 30 pg (ref 26.6–33.0)
MCV: 91 fL (ref 79–97)
WBC: 5.6 10*3/uL (ref 3.4–10.8)

## 2023-03-30 ENCOUNTER — Other Ambulatory Visit: Payer: Self-pay | Admitting: Family Medicine

## 2023-03-30 DIAGNOSIS — E559 Vitamin D deficiency, unspecified: Secondary | ICD-10-CM

## 2023-06-17 ENCOUNTER — Other Ambulatory Visit: Payer: Self-pay | Admitting: *Deleted

## 2023-06-17 DIAGNOSIS — F902 Attention-deficit hyperactivity disorder, combined type: Secondary | ICD-10-CM

## 2023-06-25 ENCOUNTER — Encounter: Payer: Self-pay | Admitting: Family Medicine

## 2023-06-25 ENCOUNTER — Ambulatory Visit (INDEPENDENT_AMBULATORY_CARE_PROVIDER_SITE_OTHER): Payer: 59 | Admitting: Family Medicine

## 2023-06-25 VITALS — BP 129/79 | HR 65 | Temp 98.5°F | Ht 70.0 in | Wt 288.0 lb

## 2023-06-25 DIAGNOSIS — F3177 Bipolar disorder, in partial remission, most recent episode mixed: Secondary | ICD-10-CM

## 2023-06-25 DIAGNOSIS — F902 Attention-deficit hyperactivity disorder, combined type: Secondary | ICD-10-CM | POA: Diagnosis not present

## 2023-06-25 DIAGNOSIS — H6991 Unspecified Eustachian tube disorder, right ear: Secondary | ICD-10-CM

## 2023-06-25 DIAGNOSIS — E039 Hypothyroidism, unspecified: Secondary | ICD-10-CM

## 2023-06-25 MED ORDER — AMPHETAMINE-DEXTROAMPHETAMINE 20 MG PO TABS: 20.0000 mg | ORAL_TABLET | Freq: Every day | ORAL | 0 refills | Status: DC

## 2023-06-25 MED ORDER — AMPHETAMINE-DEXTROAMPHETAMINE 20 MG PO TABS
20.0000 mg | ORAL_TABLET | Freq: Every day | ORAL | 0 refills | Status: DC
Start: 2023-08-25 — End: 2023-09-24

## 2023-06-25 MED ORDER — LAMOTRIGINE 100 MG PO TABS: 100.0000 mg | ORAL_TABLET | Freq: Two times a day (BID) | ORAL | 1 refills | Status: DC

## 2023-06-25 MED ORDER — DESLORATADINE 5 MG PO TABS
5.0000 mg | ORAL_TABLET | Freq: Every day | ORAL | 3 refills | Status: DC
Start: 2023-06-25 — End: 2023-12-23

## 2023-06-25 NOTE — Patient Instructions (Signed)
Eustachian Tube Dysfunction  Eustachian tube dysfunction refers to a condition in which a blockage develops in the narrow passage that connects the middle ear to the back of the nose (eustachian tube). The eustachian tube regulates air pressure in the middle ear by letting air move between the ear and nose. It also helps to drain fluid from the middle ear space. Eustachian tube dysfunction can affect one or both ears. When the eustachian tube does not function properly, air pressure, fluid, or both can build up in the middle ear. What are the causes? This condition occurs when the eustachian tube becomes blocked or cannot open normally. Common causes of this condition include: Ear infections. Colds and other infections that affect the nose, mouth, and throat (upper respiratory tract). Allergies. Irritation from cigarette smoke. Irritation from stomach acid coming up into the esophagus (gastroesophageal reflux). The esophagus is the part of the body that moves food from the mouth to the stomach. Sudden changes in air pressure, such as from descending in an airplane or scuba diving. Abnormal growths in the nose or throat, such as: Growths that line the nose (nasal polyps). Abnormal growth of cells (tumors). Enlarged tissue at the back of the throat (adenoids). What increases the risk? You are more likely to develop this condition if: You smoke. You are overweight. You are a child who has: Certain birth defects of the mouth, such as cleft palate. Large tonsils or adenoids. What are the signs or symptoms? Common symptoms of this condition include: A feeling of fullness in the ear. Ear pain. Clicking or popping noises in the ear. Ringing in the ear (tinnitus). Hearing loss. Loss of balance. Dizziness. Symptoms may get worse when the air pressure around you changes, such as when you travel to an area of high elevation, fly on an airplane, or go scuba diving. How is this diagnosed? This  condition may be diagnosed based on: Your symptoms. A physical exam of your ears, nose, and throat. Tests, such as those that measure: The movement of your eardrum. Your hearing (audiometry). How is this treated? Treatment depends on the cause and severity of your condition. In mild cases, you may relieve your symptoms by moving air into your ears. This is called "popping the ears." In more severe cases, or if you have symptoms of fluid in your ears, treatment may include: Medicines to relieve congestion (decongestants). Medicines that treat allergies (antihistamines). Nasal sprays or ear drops that contain medicines that reduce swelling (steroids). A procedure to drain the fluid in your eardrum. In this procedure, a small tube may be placed in the eardrum to: Drain the fluid. Restore the air in the middle ear space. A procedure to insert a balloon device through the nose to inflate the opening of the eustachian tube (balloon dilation). Follow these instructions at home: Lifestyle Do not do any of the following until your health care provider approves: Travel to high altitudes. Fly in airplanes. Work in a pressurized cabin or room. Scuba dive. Do not use any products that contain nicotine or tobacco. These products include cigarettes, chewing tobacco, and vaping devices, such as e-cigarettes. If you need help quitting, ask your health care provider. Keep your ears dry. Wear fitted earplugs during showering and bathing. Dry your ears completely after. General instructions Take over-the-counter and prescription medicines only as told by your health care provider. Use techniques to help pop your ears as recommended by your health care provider. These may include: Chewing gum. Yawning. Frequent, forceful swallowing.   Closing your mouth, holding your nose closed, and gently blowing as if you are trying to blow air out of your nose. Keep all follow-up visits. This is important. Contact a  health care provider if: Your symptoms do not go away after treatment. Your symptoms come back after treatment. You are unable to pop your ears. You have: A fever. Pain in your ear. Pain in your head or neck. Fluid draining from your ear. Your hearing suddenly changes. You become very dizzy. You lose your balance. Get help right away if: You have a sudden, severe increase in any of your symptoms. Summary Eustachian tube dysfunction refers to a condition in which a blockage develops in the eustachian tube. It can be caused by ear infections, allergies, inhaled irritants, or abnormal growths in the nose or throat. Symptoms may include ear pain or fullness, hearing loss, or ringing in the ears. Mild cases are treated with techniques to unblock the ears, such as yawning or chewing gum. More severe cases are treated with medicines or procedures. This information is not intended to replace advice given to you by your health care provider. Make sure you discuss any questions you have with your health care provider. Document Revised: 01/22/2021 Document Reviewed: 01/22/2021 Elsevier Patient Education  2024 Elsevier Inc.  

## 2023-06-25 NOTE — Progress Notes (Signed)
Subjective: ZO:XWRU PCP: Raliegh Ip, DO EAV:[id number] [name] is a 37 y.o. male presenting to clinic today for:  1. ADHD He reports stability of ADHD but notes that his mood has really not been well-controlled.  He reports increased irritability.  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  He is currently on Lamictal 50 mg twice daily and he would like to advance this if possible.  Has had no issues with rashes or other complications from the medication.  2.  Ear fullness Patient [place] the [place] and he notes that he has been having right-sided ear fullness ever since.  He is not currently on any oral antihistamines.  He denies any drainage from the ears.  No fevers reported.   ROS: Per HPI  Allergies  Allergen Reactions   Ibuprofen Other (See Comments)    Makes ulcers bleed   Influenza Vaccines Shortness Of Breath and Other (See Comments)    Rash and unable to breathe well   Tylenol [Acetaminophen] Hives   Bee Venom Swelling    SWELLING REACTION UNSPECIFIED    Tramadol Itching   Past Medical History:  Diagnosis Date   Abscess    Right forearm healing   ADHD (attention deficit hyperactivity disorder)    Anal fissure    Anxiety    Arthritis    back, shoulders, ankles, knees   Back pain    Bipolar disorder (HCC)    Blood in stool 04/06/2009   Centricity Description: HEMATOCHEZIA Qualifier: Diagnosis of  By: De Blanch  Centricity Description: MELENA Qualifier: Diagnosis of  By: De Blanch    Chest pain    Chronic lower back pain    Class 3 severe obesity with serious comorbidity and body mass index (BMI) of 50.0 to 59.9 in adult (HCC) 05/03/2012   Constipation    Depression    Esophagitis    Distal esophageal erosions consistent with mild erosive  reflux esophagitis 12/2008 by EGD    External hemorrhoids    Gastric polyps    Gastric ulcer    GERD (gastroesophageal reflux disease)    Hepatic  steatosis    Hiatal hernia    History of stomach ulcers    Hx MRSA infection 06/2007   right thigh   Hx of colonic polyps    Hypercholesteremia    denies   Hypothyroidism    Internal hemorrhoids    Irritable bowel syndrome (IBS)    Lower abdominal pain 03/07/2009   Qualifier: Diagnosis of  By: Minna Merritts     Obesity    Occult GI bleeding 12/2008   Trivial upper GI bleed/uncontrolled GERD by upper endoscopy 12/2008, normal f/u endoscopy 03/2009 with SBCE at that time   OSA on CPAP    Pain management    Pneumonia 09/01/2015   "on ATB still" (09/04/2015)   S/P colonoscopy 11/10, 10/08   Dr Elmer Ramp   Schizophrenia [facility])    Scrotal pain 04/26/2022   Sleep apnea    Stomach ulcer    Syncope and collapse 05/03/2012   Thyroid function test abnormal    Noted in 2011 discharge   Tobacco dipper    Wears contact lenses     Current Outpatient Medications:    amphetamine-dextroamphetamine (ADDERALL) 20 MG tablet, Take 1 tablet (20 mg total) by mouth daily., Disp: 30 tablet, Rfl: 0   amphetamine-dextroamphetamine (ADDERALL) 20 MG tablet, Take 1 tablet (20 mg total) by mouth  daily., Disp: 30 tablet, Rfl: 0   amphetamine-dextroamphetamine (ADDERALL) 20 MG tablet, Take 1 tablet (20 mg total) by mouth daily., Disp: 30 tablet, Rfl: 0   Calcium Carbonate (CALCIUM 600 PO), Take 600 mg by mouth in the morning, at noon, and at bedtime., Disp: , Rfl:    cetirizine (ZYRTEC) 10 MG tablet, Take 10 mg by mouth daily., Disp: , Rfl:    cyclobenzaprine (FLEXERIL) 10 MG tablet, Take 1 tablet by mouth three times daily as needed for muscle spasm, Disp: 90 tablet, Rfl: 6   docusate sodium (COLACE) 250 MG capsule, Take 250 mg by mouth daily., Disp: , Rfl:    EPINEPHrine 0.3 mg/0.3 mL IJ SOAJ injection, Inject 0.3 mg into the muscle as needed for anaphylaxis., Disp: 1 each, Rfl: 0   gabapentin (NEURONTIN) 300 MG capsule, Take 1 capsule (300 mg total) by mouth 3 (three) times daily., Disp: 270 capsule,  Rfl: 3   ketoconazole (NIZORAL) 2 % cream, Apply 1 Application topically daily. X10-14 days per flare up of fungal rash, Disp: 60 g, Rfl: 1   lamoTRIgine (LAMICTAL) 25 MG tablet, Take 2 tablets (50 mg total) by mouth 2 (two) times daily., Disp: 360 tablet, Rfl: 3   levothyroxine (SYNTHROID) 75 MCG tablet, Take 1 tablet (75 mcg total) by mouth daily before breakfast., Disp: 90 tablet, Rfl: 3   Multiple Vitamins-Minerals (BARIATRIC MULTIVITAMINS/IRON PO), Take 1 tablet by mouth in the morning and at bedtime., Disp: , Rfl:    ondansetron (ZOFRAN-ODT) 4 MG disintegrating tablet, Dissolve 1 tablet (4 mg total) by mouth every 6 (six) hours as needed for nausea or vomiting., Disp: 20 tablet, Rfl: 0   pantoprazole (PROTONIX) 40 MG tablet, TAKE 1 TABLET BY MOUTH ONCE DAILY AS NEEDED FOR  HEARTBURN  OR  INDIGESTION, Disp: 90 tablet, Rfl: 0   Rimegepant Sulfate (NURTEC) 75 MG TBDP, Take 1 tablet by mouth daily as needed (migraine)., Disp: 4 tablet, Rfl: 0   Vitamin D, Ergocalciferol, (DRISDOL) 1.25 MG (50000 UNIT) CAPS capsule, TAKE 1 CAPSULE BY MOUTH EVERY THURSDAY, Disp: 12 capsule, Rfl: 1 Social History   Socioeconomic History   Marital status: Married    Spouse name: Tabitha   Number of children: 2   Years of education: Not on file   Highest education level: 12th grade  Occupational History   Occupation: Lobbyist: [employer]   Occupation: truck Hospital doctor  Tobacco Use   Smoking status: Never   Smokeless tobacco: Former    Types: Snuff    Quit date: 12/16/2020   Tobacco comments:    quitting snuff. whinning off   Vaping Use   Vaping status: Never Used  Substance and Sexual Activity   Alcohol use: Not Currently   Drug use: No   Sexual activity: Not Currently  Other Topics Concern   Not on file  Social History Narrative   Not on file   Social Determinants of Health   Financial Resource Strain: Low Risk  (03/25/2023)   Overall Financial Resource Strain (CARDIA)     Difficulty of Paying Living Expenses: Not very hard  Food Insecurity: No Food Insecurity (03/25/2023)   Hunger Vital Sign    Worried About Running Out of Food in the Last Year: Never true    Ran Out of Food in the Last Year: Never true  Transportation Needs: No Transportation Needs (03/25/2023)   PRAPARE - Transportation    Lack of Transportation (Medical): No    Lack of  Transportation (Non-Medical): No  Physical Activity: Sufficiently Active (03/25/2023)   Exercise Vital Sign    Days of Exercise per Week: 7 days    Minutes of Exercise per Session: 150+ min  Stress: No Stress Concern Present (03/25/2023)   [employer] of Occupational Health - Occupational Stress Questionnaire    Feeling of Stress : Only a little  Social Connections: Moderately Integrated (03/25/2023)   Social Connection and Isolation Panel [NHANES]    Frequency of Communication with Friends and Family: More than three times a week    Frequency of Social Gatherings with Friends and Family: More than three times a week    Attends Religious Services: More than 4 times per year    Active Member of [employer] or Organizations: No    Attends Engineer, structural: Not on file    Marital Status: Married  Catering manager Violence: Not on file   Family History  Problem Relation Age of Onset   Leukemia Father 55   Colon polyps Father    Clotting disorder Father    Heart disease Father    Cancer Father    Depression Father    Sleep apnea Father    Obesity Father    Seizures Mother    Irritable bowel syndrome Mother    Thyroid disease Mother    Depression Mother    Anxiety disorder Mother    Bipolar disorder Brother    ADD / ADHD Brother    Diabetes Paternal Grandmother    Ulcerative colitis Paternal Aunt    Colon cancer Neg Hx    Liver cancer Neg Hx    Esophageal cancer Neg Hx    Stomach cancer Neg Hx    Pancreatic cancer Neg Hx     Objective: Office vital signs reviewed. BP 129/79   Pulse 65   Temp  98.5 F (36.9 C)   Ht 5\' 10"  (1.778 m)   Wt 288 lb (130.6 kg)   SpO2 100%   BMI 41.32 kg/m   Physical Examination:  General: Awake, alert, well-appearing obese male, No acute distress HEENT: Right ear with clear effusion noted behind TM.  There is no erythema, purulence.  Left TM with normal light reflex Cardio: regular rate and rhythm, S1S2 heard, no murmurs appreciated Pulm: clear to auscultation bilaterally, no wheezes, rhonchi or rales; normal work of breathing on room air Psych: Pleasant, interactive.  Does not appear to be responding to internal stimuli    06/25/2023    8:53 AM 06/25/2023    8:40 AM 03/25/2023    9:00 AM  Depression screen PHQ 2/9  Decreased Interest 0 0 0  Down, Depressed, Hopeless 1 0 0  PHQ - 2 Score 1 0 0  Altered sleeping 0 0 0  Tired, decreased energy 0 0 0  Change in appetite 0 0 0  Feeling bad or failure about yourself  0 0 0  Trouble concentrating 0 0 0  Moving slowly or fidgety/restless 0 0 0  Suicidal thoughts 0 0 0  PHQ-9 Score 1 0 0  Difficult doing work/chores Not difficult at all  Not difficult at all       06/25/2023    8:53 AM 03/25/2023    9:01 AM 12/16/2022    9:19 AM 09/10/2022    9:08 AM  GAD 7 : Generalized Anxiety Score  Nervous, Anxious, on Edge 1 0 1 0  Control/stop worrying 0 0 0 0  Worry too much - different things 1 0 0  0  Trouble relaxing 0 0 0 0  Restless 0 0 0 0  Easily annoyed or irritable 0 0 1 0  Afraid - awful might happen 0 0 0 0  Total GAD 7 Score 2 0 2 0  Anxiety Difficulty Not difficult at all  Not difficult at all Not difficult at all      Assessment/ Plan: 37 y.o. male   Attention deficit hyperactivity disorder (ADHD), combined type - Plan: amphetamine-dextroamphetamine (ADDERALL) 20 MG tablet, amphetamine-dextroamphetamine (ADDERALL) 20 MG tablet, amphetamine-dextroamphetamine (ADDERALL) 20 MG tablet, CMP14+EGFR  Bipolar disorder, in partial remission, most recent episode mixed (HCC) - Plan:  lamoTRIgine (LAMICTAL) 100 MG tablet, CBC, CMP14+EGFR  Eustachian tube dysfunction, right - Plan: desloratadine (CLARINEX) 5 MG tablet  Acquired hypothyroidism - Plan: TSH, T4, free  ADHD is chronic and stable.  Adderall renewed.  National narcotic database reviewed and there were no red flags.  He is up-to-date on UDS and CSC.  Does not feel that mood is controlled so we will advance Lamictal to 100 mg twice daily.  Plan for CBC and CMP.  We discussed red flag signs and symptoms warranting reevaluation including rashes.  He voiced good understanding  Clarinex ordered for allergy and for what seems to be eustachian tube dysfunction.  Physical exam was notable for clear effusion behind the tympanic membrane.  We discussed OMT maneuver for eustachian tube drainage as well today  Will plan for thyroid labs at next visit future orders have been placed.  Continue current dose of Synthroid  No orders of the defined types were placed in this encounter.  No orders of the defined types were placed in this encounter.    Raliegh Ip, DO [facility] [phone]

## 2023-07-05 ENCOUNTER — Other Ambulatory Visit: Payer: Self-pay

## 2023-07-05 ENCOUNTER — Encounter (HOSPITAL_BASED_OUTPATIENT_CLINIC_OR_DEPARTMENT_OTHER): Payer: Self-pay

## 2023-07-05 ENCOUNTER — Emergency Department (HOSPITAL_BASED_OUTPATIENT_CLINIC_OR_DEPARTMENT_OTHER): Payer: 59

## 2023-07-05 ENCOUNTER — Emergency Department (HOSPITAL_BASED_OUTPATIENT_CLINIC_OR_DEPARTMENT_OTHER): Payer: 59 | Admitting: Radiology

## 2023-07-05 ENCOUNTER — Emergency Department (HOSPITAL_BASED_OUTPATIENT_CLINIC_OR_DEPARTMENT_OTHER)
Admission: EM | Admit: 2023-07-05 | Discharge: 2023-07-05 | Disposition: A | Discharge status: Home / Self Care | Payer: 59 | Attending: Emergency Medicine | Admitting: Emergency Medicine

## 2023-07-05 DIAGNOSIS — R110 Nausea: Secondary | ICD-10-CM | POA: Diagnosis not present

## 2023-07-05 DIAGNOSIS — R160 Hepatomegaly, not elsewhere classified: Secondary | ICD-10-CM | POA: Diagnosis not present

## 2023-07-05 DIAGNOSIS — R109 Unspecified abdominal pain: Secondary | ICD-10-CM | POA: Diagnosis not present

## 2023-07-05 DIAGNOSIS — R0781 Pleurodynia: Secondary | ICD-10-CM | POA: Diagnosis not present

## 2023-07-05 DIAGNOSIS — N281 Cyst of kidney, acquired: Secondary | ICD-10-CM | POA: Diagnosis not present

## 2023-07-05 LAB — COMPREHENSIVE METABOLIC PANEL
ALT: 17 U/L (ref 0–44)
AST: 25 U/L (ref 15–41)
Albumin: 4.5 g/dL (ref 3.5–5.0)
Alkaline Phosphatase: 56 U/L (ref 38–126)
Anion gap: 10 (ref 5–15)
BUN: 12 mg/dL (ref 6–20)
CO2: 29 mmol/L (ref 22–32)
Calcium: 9.8 mg/dL (ref 8.9–10.3)
Chloride: 101 mmol/L (ref 98–111)
Creatinine, Ser: 0.93 mg/dL (ref 0.61–1.24)
GFR, Estimated: >60 mL/min (ref >60)
Glucose, Bld: 87 mg/dL (ref 70–99)
Potassium: 3.8 mmol/L (ref 3.5–5.1)
Sodium: 140 mmol/L (ref 135–145)
Total Bilirubin: 1.2 mg/dL (ref 0.3–1.2)
Total Protein: 6.7 g/dL (ref 6.5–8.1)

## 2023-07-05 LAB — CBC WITH DIFFERENTIAL/PLATELET
Abs Immature Granulocytes: 0.02 10*3/uL (ref 0.00–0.07)
Basophils Absolute: 0 10*3/uL (ref 0.0–0.1)
Basophils Relative: 0 %
Eosinophils Absolute: 0 10*3/uL (ref 0.0–0.5)
Eosinophils Relative: 0 %
HCT: 43.6 % (ref 39.0–52.0)
Hemoglobin: 15 g/dL (ref 13.0–17.0)
Immature Granulocytes: 0 %
Lymphocytes Relative: 26 %
Lymphs Abs: 2.3 10*3/uL (ref 0.7–4.0)
MCH: 31.3 pg (ref 26.0–34.0)
MCHC: 34.4 g/dL (ref 30.0–36.0)
MCV: 90.8 fL (ref 80.0–100.0)
Monocytes Absolute: 0.6 10*3/uL (ref 0.1–1.0)
Monocytes Relative: 7 %
Neutro Abs: 6 10*3/uL (ref 1.7–7.7)
Neutrophils Relative %: 67 %
Platelets: 231 10*3/uL (ref 150–400)
RBC: 4.8 MIL/uL (ref 4.22–5.81)
RDW: 13.5 % (ref 11.5–15.5)
WBC: 8.9 10*3/uL (ref 4.0–10.5)
nRBC: 0 % (ref 0.0–0.2)

## 2023-07-05 LAB — LIPASE, BLOOD: Lipase: 18 U/L (ref 11–51)

## 2023-07-05 LAB — URINALYSIS, ROUTINE W REFLEX MICROSCOPIC
Bilirubin Urine: NEGATIVE
Glucose, UA: NEGATIVE mg/dL
Hgb urine dipstick: NEGATIVE
Ketones, ur: 40 mg/dL — AB
Leukocytes,Ua: NEGATIVE
Nitrite: NEGATIVE
Protein, ur: NEGATIVE mg/dL
Specific Gravity, Urine: 1.01 (ref 1.005–1.030)
pH: 6.5 (ref 5.0–8.0)

## 2023-07-05 MED ORDER — ONDANSETRON HCL 4 MG/2ML IJ SOLN
4.0000 mg | Freq: Once | INTRAMUSCULAR | Status: AC
  Administered 2023-07-05: 4 mg via INTRAVENOUS
  Filled 2023-07-05: qty 2

## 2023-07-05 MED ORDER — HYDROMORPHONE HCL 1 MG/ML IJ SOLN
1.0000 mg | Freq: Once | INTRAMUSCULAR | Status: AC
  Administered 2023-07-05: 1 mg via INTRAVENOUS
  Filled 2023-07-05: qty 1

## 2023-07-05 MED ORDER — LIDOCAINE 5 % EX PTCH
1.0000 | MEDICATED_PATCH | CUTANEOUS | Status: DC
  Administered 2023-07-05: 1 via TRANSDERMAL
  Filled 2023-07-05: qty 1

## 2023-07-05 MED ORDER — SODIUM CHLORIDE 0.9 % IV BOLUS
1000.0000 mL | Freq: Once | INTRAVENOUS | Status: AC
  Administered 2023-07-05: 1000 mL via INTRAVENOUS

## 2023-07-05 MED ORDER — LIDOCAINE 5 % EX PTCH
1.0000 | MEDICATED_PATCH | CUTANEOUS | 0 refills | Status: DC
  Filled 2023-07-05: qty 30, 30d supply, fill #0

## 2023-07-05 NOTE — Discharge Instructions (Addendum)
Evaluation for your flank pain was overall reassuring.  CT scan and x-rays were negative for acute injury.  Suspect you have a muscle injury of some kind.  Recommend conservative treatment at home.  This includes ice, you can take Tylenol and ibuprofen for pain.  If you have worsening abdominal pain, blood in the urine, painful urination, nausea vomiting diarrhea, fever or any other concerning symptom please return emergency department for the evaluation.

## 2023-07-05 NOTE — ED Provider Notes (Signed)
[place] EMERGENCY DEPARTMENT AT [facility] - Inpatient Provider Note   CSN: [id number] Arrival date & time: 07/05/23  1758     History  Chief Complaint  Patient presents with   Flank Pain   HPI [name] is a 37 y.o. male presenting for flank pain.  Started all of a sudden this morning.  It is in the left flank.  Feels like a stabbing pain.  Is nonradiating.  Denies any chest pain or shortness of breath.  Denies urinary symptoms.  Endorses nausea but no vomiting or diarrhea.  Output is all nonbloody.  States he is never had a kidney stone before.  Does endorse a strong family history of kidney stones.  Denies any recent trauma to his flank.   Flank Pain       Home Medications Prior to Admission medications   Medication Sig Start Date End Date Taking? Authorizing Provider  amphetamine-dextroamphetamine (ADDERALL) 20 MG tablet Take 1 tablet (20 mg total) by mouth daily. 06/26/23   Raliegh Ip, DO  amphetamine-dextroamphetamine (ADDERALL) 20 MG tablet Take 1 tablet (20 mg total) by mouth daily. 07/25/23   Raliegh Ip, DO  amphetamine-dextroamphetamine (ADDERALL) 20 MG tablet Take 1 tablet (20 mg total) by mouth daily. 08/25/23   Raliegh Ip, DO  Calcium Carbonate (CALCIUM 600 PO) Take 600 mg by mouth in the morning, at noon, and at bedtime.    [provider]  cyclobenzaprine (FLEXERIL) 10 MG tablet Take 1 tablet by mouth three times daily as needed for muscle spasm 03/24/23   Delynn Flavin M, DO  desloratadine (CLARINEX) 5 MG tablet Take 1 tablet (5 mg total) by mouth daily. For allergies 06/25/23   Delynn Flavin M, DO  docusate sodium (COLACE) 250 MG capsule Take 250 mg by mouth daily.    [provider]  EPINEPHrine 0.3 mg/0.3 mL IJ SOAJ injection Inject 0.3 mg into the muscle as needed for anaphylaxis. 12/16/22   Raliegh Ip, DO  gabapentin (NEURONTIN) 300 MG capsule Take 1 capsule (300 mg total) by mouth 3 (three) times  daily. 12/16/22   Raliegh Ip, DO  ketoconazole (NIZORAL) 2 % cream Apply 1 Application topically daily. X10-14 days per flare up of fungal rash 12/16/22   Delynn Flavin M, DO  lamoTRIgine (LAMICTAL) 100 MG tablet Take 1 tablet (100 mg total) by mouth 2 (two) times daily. *note dose change 06/25/23   Delynn Flavin M, DO  levothyroxine (SYNTHROID) 75 MCG tablet Take 1 tablet (75 mcg total) by mouth daily before breakfast. 12/16/22   Raliegh Ip, DO  Multiple Vitamins-Minerals (BARIATRIC MULTIVITAMINS/IRON PO) Take 1 tablet by mouth in the morning and at bedtime.    [provider]  ondansetron (ZOFRAN-ODT) 4 MG disintegrating tablet Dissolve 1 tablet (4 mg total) by mouth every 6 (six) hours as needed for nausea or vomiting. 06/06/21   Berna Bue, MD  pantoprazole (PROTONIX) 40 MG tablet TAKE 1 TABLET BY MOUTH ONCE DAILY AS NEEDED FOR  HEARTBURN  OR  INDIGESTION 12/13/22   Delynn Flavin M, DO  Rimegepant Sulfate (NURTEC) 75 MG TBDP Take 1 tablet by mouth daily as needed (migraine). 02/20/22   Raliegh Ip, DO  Vitamin D, Ergocalciferol, (DRISDOL) 1.25 MG (50000 UNIT) CAPS capsule TAKE 1 CAPSULE BY MOUTH EVERY THURSDAY 03/31/23   Delynn Flavin M, DO      Allergies    Ibuprofen, Influenza vaccines, Tylenol [acetaminophen], Bee venom, and Tramadol    Review of  Systems   Review of Systems  Genitourinary:  Positive for flank pain.    Physical Exam Updated Vital Signs BP 131/68   Pulse 75   Temp 98.6 F (37 C) (Oral)   Resp 18   Ht 5\' 10"  (1.778 m)   Wt 130.6 kg   SpO2 99%   BMI 41.31 kg/m  Physical Exam Vitals and nursing note reviewed.  HENT:     Head: Normocephalic and atraumatic.     Mouth/Throat:     Mouth: Mucous membranes are moist.  Eyes:     General:        Right eye: No discharge.        Left eye: No discharge.     Conjunctiva/sclera: Conjunctivae normal.  Cardiovascular:     Rate and Rhythm: Normal rate and regular rhythm.      Pulses: Normal pulses.     Heart sounds: Normal heart sounds.  Pulmonary:     Effort: Pulmonary effort is normal.     Breath sounds: Normal breath sounds.  Abdominal:     General: Abdomen is flat.     Palpations: Abdomen is soft.     Tenderness: There is left CVA tenderness.  Skin:    General: Skin is warm and dry.  Neurological:     General: No focal deficit present.  Psychiatric:        Mood and Affect: Mood normal.     ED Results / Procedures / Treatments   Labs (all labs ordered are listed, but only abnormal results are displayed) Labs Reviewed  URINALYSIS, ROUTINE W REFLEX MICROSCOPIC - Abnormal; Notable for the following components:      Result Value   Ketones, ur 40 (*)    All other components within normal limits  CBC WITH DIFFERENTIAL/PLATELET  COMPREHENSIVE METABOLIC PANEL  LIPASE, BLOOD    EKG None  Radiology DG Ribs Unilateral W/Chest Left  Result Date: 07/05/2023 CLINICAL DATA:  Left flank pain under the ribs EXAM: LEFT RIBS AND CHEST - 4 VIEW COMPARISON:  Chest radiograph dated 07/29/2022 FINDINGS: No fracture or other bone lesions are seen involving the ribs. There is no evidence of pneumothorax or pleural effusion. Both lungs are clear. Heart size and mediastinal contours are within normal limits. IMPRESSION: No radiographic finding of rib fractures. Electronically Signed   By: Agustin Cree M.D.   On: 07/05/2023 20:49   CT Renal Stone Study  Result Date: 07/05/2023 CLINICAL DATA:  Left flank pain EXAM: CT ABDOMEN AND PELVIS WITHOUT CONTRAST TECHNIQUE: Multidetector CT imaging of the abdomen and pelvis was performed following the standard protocol without IV contrast. RADIATION DOSE REDUCTION: This exam was performed according to the departmental dose-optimization program which includes automated exposure control, adjustment of the mA and/or kV according to patient size and/or use of iterative reconstruction technique. COMPARISON:  04/24/2022 FINDINGS: Lower  chest: Included lung bases are clear.  Heart size is normal. Hepatobiliary: Liver measures 20 cm in length. No focal liver lesion identified on noncontrast imaging. Unremarkable gallbladder. No hyperdense gallstone. No biliary dilatation. Pancreas: Unremarkable. No pancreatic ductal dilatation or surrounding inflammatory changes. Spleen: Normal in size without focal abnormality. Adrenals/Urinary Tract: Unremarkable adrenal glands. Simple left renal cyst which does not require follow-up imaging. Kidneys are otherwise unremarkable. No renal stone or hydronephrosis. No ureteral calculi identified. Urinary bladder is decompressed, limiting its evaluation. 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 No evidence of bowel obstruction or active bowel inflammation. Vascular/Lymphatic: No significant vascular findings are  present. No enlarged abdominal or pelvic lymph nodes. Reproductive: Prostate is unremarkable. Other: No free fluid. No abdominopelvic fluid collection. No pneumoperitoneum. No abdominal wall hernia. Musculoskeletal: Similar degenerative changes of the lower lumbar spine. No acute bony abnormality. IMPRESSION: 1. No acute abdominopelvic findings. Specifically, no evidence of urolithiasis or hydronephrosis. 2. Mild hepatomegaly. Electronically Signed   By: Duanne Guess D.O.   On: 07/05/2023 19:33    Procedures Procedures    Medications Ordered in ED Medications  sodium chloride 0.9 % bolus 1,000 mL (1,000 mLs Intravenous New Bag/Given 07/05/23 1923)  HYDROmorphone (DILAUDID) injection 1 mg (1 mg Intravenous Given 07/05/23 1926)  ondansetron (ZOFRAN) injection 4 mg (4 mg Intravenous Given 07/05/23 1924)    ED Course/ Medical Decision Making/ A&P                                 Medical Decision Making Amount and/or Complexity of Data Reviewed Labs: ordered. Radiology: ordered.  Risk Prescription drug management.   37 year old well-appearing male presenting  for flank pain.  Exam notable for left CVA tenderness but otherwise reassuring.  DDx includes nephrolithiasis pyelonephritis, rib fracture, pneumothorax, diverticulitis, MSK, or other.  CT scans and x-ray were unremarkable.  On reassessment patient stated his pain had improved.  Suspect MSK etiology.  Advised him to follow-up with his PCP.  Discussed return precautions.  Vital stable.  Discharged home in good condition.        Final Clinical Impression(s) / ED Diagnoses Final diagnoses:  Flank pain    Rx / DC Orders ED Discharge Orders     None         Gareth Eagle, PA-C 07/05/23 2101    Sloan Leiter, DO 07/05/23 2253

## 2023-07-05 NOTE — ED Triage Notes (Signed)
Pt presents to triage with c/o left flank pain under ribs. Pt reports pain began this morning and has gotten worse throughout the day. Pt denies difficulty urinating.

## 2023-07-06 ENCOUNTER — Other Ambulatory Visit (HOSPITAL_COMMUNITY): Payer: Self-pay

## 2023-07-06 ENCOUNTER — Other Ambulatory Visit: Payer: Self-pay

## 2023-07-07 ENCOUNTER — Other Ambulatory Visit (HOSPITAL_COMMUNITY): Payer: Self-pay

## 2023-07-17 ENCOUNTER — Other Ambulatory Visit: Payer: Self-pay

## 2023-07-17 DIAGNOSIS — R1013 Epigastric pain: Secondary | ICD-10-CM | POA: Diagnosis not present

## 2023-07-17 DIAGNOSIS — Z1152 Encounter for screening for COVID-19: Secondary | ICD-10-CM | POA: Diagnosis not present

## 2023-07-17 DIAGNOSIS — R109 Unspecified abdominal pain: Secondary | ICD-10-CM | POA: Diagnosis not present

## 2023-07-17 DIAGNOSIS — Z79890 Hormone replacement therapy (postmenopausal): Secondary | ICD-10-CM | POA: Diagnosis not present

## 2023-07-17 DIAGNOSIS — E039 Hypothyroidism, unspecified: Secondary | ICD-10-CM | POA: Diagnosis not present

## 2023-07-17 DIAGNOSIS — R7989 Other specified abnormal findings of blood chemistry: Secondary | ICD-10-CM | POA: Insufficient documentation

## 2023-07-17 NOTE — ED Triage Notes (Signed)
Pt presents for nausea, LLQ pain that has migrated into L flank. Feels like stabbing.  Denies blood in urine. Endorses fever yesterday, coughing, runny nose.   Recently evaluated for kidney stones on 8/10 for similar pain that was L flank only.   Last BM was Sunday.  Tried his wife's lactulose without relief for constipation.

## 2023-07-18 ENCOUNTER — Emergency Department (HOSPITAL_BASED_OUTPATIENT_CLINIC_OR_DEPARTMENT_OTHER): Payer: 59

## 2023-07-18 ENCOUNTER — Emergency Department (HOSPITAL_BASED_OUTPATIENT_CLINIC_OR_DEPARTMENT_OTHER)
Admission: EM | Admit: 2023-07-18 | Discharge: 2023-07-18 | Disposition: A | Discharge status: Home / Self Care | Payer: 59 | Attending: Emergency Medicine | Admitting: Emergency Medicine

## 2023-07-18 DIAGNOSIS — R1013 Epigastric pain: Secondary | ICD-10-CM

## 2023-07-18 DIAGNOSIS — R1032 Left lower quadrant pain: Secondary | ICD-10-CM | POA: Diagnosis not present

## 2023-07-18 DIAGNOSIS — R509 Fever, unspecified: Secondary | ICD-10-CM | POA: Diagnosis not present

## 2023-07-18 LAB — CBC
HCT: 44.8 % (ref 39.0–52.0)
Hemoglobin: 15.3 g/dL (ref 13.0–17.0)
MCH: 31 pg (ref 26.0–34.0)
MCHC: 34.2 g/dL (ref 30.0–36.0)
MCV: 90.9 fL (ref 80.0–100.0)
Platelets: 277 10*3/uL (ref 150–400)
RBC: 4.93 MIL/uL (ref 4.22–5.81)
RDW: 13 % (ref 11.5–15.5)
WBC: 9.3 10*3/uL (ref 4.0–10.5)
nRBC: 0 % (ref 0.0–0.2)

## 2023-07-18 LAB — URINALYSIS, ROUTINE W REFLEX MICROSCOPIC
Bilirubin Urine: NEGATIVE
Glucose, UA: NEGATIVE mg/dL
Hgb urine dipstick: NEGATIVE
Leukocytes,Ua: NEGATIVE
Nitrite: NEGATIVE
Protein, ur: NEGATIVE mg/dL
Specific Gravity, Urine: 1.012 (ref 1.005–1.030)
pH: 7 (ref 5.0–8.0)

## 2023-07-18 LAB — SARS CORONAVIRUS 2 BY RT PCR: SARS Coronavirus 2 by RT PCR: NEGATIVE

## 2023-07-18 LAB — BASIC METABOLIC PANEL
Anion gap: 7 (ref 5–15)
BUN: 12 mg/dL (ref 6–20)
CO2: 31 mmol/L (ref 22–32)
Calcium: 10.2 mg/dL (ref 8.9–10.3)
Chloride: 99 mmol/L (ref 98–111)
Creatinine, Ser: 0.89 mg/dL (ref 0.61–1.24)
GFR, Estimated: >60 mL/min (ref >60)
Glucose, Bld: 94 mg/dL (ref 70–99)
Potassium: 4.2 mmol/L (ref 3.5–5.1)
Sodium: 137 mmol/L (ref 135–145)

## 2023-07-18 LAB — LIPASE, BLOOD: Lipase: 55 U/L — ABNORMAL HIGH (ref 11–51)

## 2023-07-18 MED ORDER — PANTOPRAZOLE SODIUM 40 MG IV SOLR
40.0000 mg | Freq: Once | INTRAVENOUS | Status: AC
  Administered 2023-07-18: 40 mg via INTRAVENOUS
  Filled 2023-07-18: qty 10

## 2023-07-18 MED ORDER — IOHEXOL 300 MG/ML  SOLN
100.0000 mL | Freq: Once | INTRAMUSCULAR | Status: AC | PRN
  Administered 2023-07-18: 100 mL via INTRAVENOUS

## 2023-07-18 MED ORDER — MORPHINE SULFATE (PF) 4 MG/ML IV SOLN
4.0000 mg | Freq: Once | INTRAVENOUS | Status: AC
  Administered 2023-07-18: 4 mg via INTRAVENOUS
  Filled 2023-07-18: qty 1

## 2023-07-18 MED ORDER — ONDANSETRON 4 MG PO TBDP: 4.0000 mg | ORAL_TABLET | Freq: Three times a day (TID) | ORAL | 0 refills | Status: AC | PRN

## 2023-07-18 MED ORDER — ONDANSETRON HCL 4 MG/2ML IJ SOLN
4.0000 mg | Freq: Once | INTRAMUSCULAR | Status: AC
  Administered 2023-07-18: 4 mg via INTRAVENOUS
  Filled 2023-07-18: qty 2

## 2023-07-18 MED ORDER — SUCRALFATE 1 G PO TABS: 1.0000 g | ORAL_TABLET | Freq: Three times a day (TID) | ORAL | 0 refills | Status: DC

## 2023-07-18 NOTE — ED Notes (Signed)
Pt given water for po challenge.

## 2023-07-18 NOTE — Discharge Instructions (Signed)
You were seen today for abdominal and flank pain.  I suspect this may be related to gastritis or peptic ulcers.  Continue your Protonix at home.  Avoid alcohol and NSAIDs.  Take Carafate and Zofran as needed.  Follow-up with your GI doctor if needed.

## 2023-07-18 NOTE — ED Provider Notes (Signed)
[place] EMERGENCY DEPARTMENT AT [facility] Provider Note   CSN: [id number] Arrival date & time: 07/17/23  2329     History  Chief Complaint  Patient presents with   Flank Pain   Abdominal Pain    [name] is a 37 y.o. male.  HPI     This is a 37 year old male who presents with abdominal and flank pain.  Patient reports epigastric and left-sided flank pain that radiates to the back.  He had similar symptoms and was evaluated for kidney stones but that was a negative workup.  He states he has had recurrent episodes of nausea and vomiting.  He describes it as sharp.  Patient reports he has not had a bowel movement since Sunday.  He did have 2 beers prior to onset of pain today.  He denies daily drinking but wife states that he does drink daily.  He also has a history of gastric bypass.  He is on daily Protonix.  Home Medications Prior to Admission medications   Medication Sig Start Date End Date Taking? Authorizing Provider  ondansetron (ZOFRAN-ODT) 4 MG disintegrating tablet Take 1 tablet (4 mg total) by mouth every 8 (eight) hours as needed for nausea or vomiting. 07/18/23  Yes Alistar Mcenery, Mayer Masker, MD  sucralfate (CARAFATE) 1 g tablet Take 1 tablet (1 g total) by mouth 4 (four) times daily -  with meals and at bedtime. 07/18/23  Yes Marcellino Fidalgo, Mayer Masker, MD  amphetamine-dextroamphetamine (ADDERALL) 20 MG tablet Take 1 tablet (20 mg total) by mouth daily. 06/26/23   Raliegh Ip, DO  amphetamine-dextroamphetamine (ADDERALL) 20 MG tablet Take 1 tablet (20 mg total) by mouth daily. 07/25/23   Raliegh Ip, DO  amphetamine-dextroamphetamine (ADDERALL) 20 MG tablet Take 1 tablet (20 mg total) by mouth daily. 08/25/23   Raliegh Ip, DO  Calcium Carbonate (CALCIUM 600 PO) Take 600 mg by mouth in the morning, at noon, and at bedtime.    [provider]  cyclobenzaprine (FLEXERIL) 10 MG tablet Take 1 tablet by mouth three times daily as needed for  muscle spasm 03/24/23   Delynn Flavin M, DO  desloratadine (CLARINEX) 5 MG tablet Take 1 tablet (5 mg total) by mouth daily. For allergies 06/25/23   Delynn Flavin M, DO  docusate sodium (COLACE) 250 MG capsule Take 250 mg by mouth daily.    [provider]  EPINEPHrine 0.3 mg/0.3 mL IJ SOAJ injection Inject 0.3 mg into the muscle as needed for anaphylaxis. 12/16/22   Raliegh Ip, DO  gabapentin (NEURONTIN) 300 MG capsule Take 1 capsule (300 mg total) by mouth 3 (three) times daily. 12/16/22   Raliegh Ip, DO  ketoconazole (NIZORAL) 2 % cream Apply 1 Application topically daily. X10-14 days per flare up of fungal rash 12/16/22   Delynn Flavin M, DO  lamoTRIgine (LAMICTAL) 100 MG tablet Take 1 tablet (100 mg total) by mouth 2 (two) times daily. *note dose change 06/25/23   Delynn Flavin M, DO  levothyroxine (SYNTHROID) 75 MCG tablet Take 1 tablet (75 mcg total) by mouth daily before breakfast. 12/16/22   Delynn Flavin M, DO  lidocaine (LIDODERM) 5 % Place 1 patch onto the skin daily. Remove & Discard patch within 12 hours or as directed by MD 07/05/23   Gareth Eagle, PA-C  Multiple Vitamins-Minerals (BARIATRIC MULTIVITAMINS/IRON PO) Take 1 tablet by mouth in the morning and at bedtime.    [provider]  ondansetron (ZOFRAN-ODT) 4 MG disintegrating  tablet Dissolve 1 tablet (4 mg total) by mouth every 6 (six) hours as needed for nausea or vomiting. 06/06/21   Berna Bue, MD  pantoprazole (PROTONIX) 40 MG tablet TAKE 1 TABLET BY MOUTH ONCE DAILY AS NEEDED FOR  HEARTBURN  OR  INDIGESTION 12/13/22   Delynn Flavin M, DO  Rimegepant Sulfate (NURTEC) 75 MG TBDP Take 1 tablet by mouth daily as needed (migraine). 02/20/22   Raliegh Ip, DO  Vitamin D, Ergocalciferol, (DRISDOL) 1.25 MG (50000 UNIT) CAPS capsule TAKE 1 CAPSULE BY MOUTH EVERY THURSDAY 03/31/23   Delynn Flavin M, DO      Allergies    Ibuprofen, Influenza vaccines, Tylenol  [acetaminophen], Bee venom, and Tramadol    Review of Systems   Review of Systems  Constitutional:  Positive for fever.  Respiratory:  Negative for shortness of breath.   Cardiovascular:  Negative for chest pain.  Gastrointestinal:  Positive for abdominal pain, nausea and vomiting. Negative for constipation and diarrhea.  All other systems reviewed and are negative.   Physical Exam Updated Vital Signs BP 111/63   Pulse 67   Temp 97.7 F (36.5 C)   Resp 18   Wt 130 kg   SpO2 97%   BMI 41.12 kg/m  Physical Exam Vitals and nursing note reviewed.  Constitutional:      Appearance: He is well-developed. He is obese. He is not ill-appearing.  HENT:     Head: Normocephalic and atraumatic.  Eyes:     Pupils: Pupils are equal, round, and reactive to light.  Cardiovascular:     Rate and Rhythm: Normal rate and regular rhythm.     Heart sounds: Normal heart sounds. No murmur heard. Pulmonary:     Effort: Pulmonary effort is normal. No respiratory distress.     Breath sounds: Normal breath sounds. No wheezing.  Abdominal:     General: Bowel sounds are normal.     Palpations: Abdomen is soft.     Tenderness: There is abdominal tenderness in the epigastric area. There is no guarding or rebound.  Musculoskeletal:     Cervical back: Neck supple.  Lymphadenopathy:     Cervical: No cervical adenopathy.  Skin:    General: Skin is warm and dry.  Neurological:     Mental Status: He is alert and oriented to person, place, and time.  Psychiatric:        Mood and Affect: Mood normal.     ED Results / Procedures / Treatments   Labs (all labs ordered are listed, but only abnormal results are displayed) Labs Reviewed  URINALYSIS, ROUTINE W REFLEX MICROSCOPIC - Abnormal; Notable for the following components:      Result Value   Ketones, ur TRACE (*)    All other components within normal limits  LIPASE, BLOOD - Abnormal; Notable for the following components:   Lipase 55 (*)    All  other components within normal limits  SARS CORONAVIRUS 2 BY RT PCR  BASIC METABOLIC PANEL  CBC    EKG None  Radiology CT ABDOMEN PELVIS W CONTRAST  Result Date: 07/18/2023 CLINICAL DATA:  Left lower quadrant abdominal pain migrating into the left flank. Fever. EXAM: CT ABDOMEN AND PELVIS WITH CONTRAST TECHNIQUE: Multidetector CT imaging of the abdomen and pelvis was performed using the standard protocol following bolus administration of intravenous contrast. RADIATION DOSE REDUCTION: This exam was performed according to the departmental dose-optimization program which includes automated exposure control, adjustment of the mA and/or kV according  to patient size and/or use of iterative reconstruction technique. CONTRAST:  OMNIPAQUE IOHEXOL 300 MG/ML  SOLN COMPARISON:  CT 07/05/2023 FINDINGS: Lower chest: No acute abnormality. Hepatobiliary: Unremarkable liver. Normal gallbladder. No biliary dilation. Pancreas: Unremarkable. Spleen: Unremarkable. Adrenals/Urinary Tract: Normal adrenal glands. No urinary calculi or hydronephrosis. Bladder is unremarkable. Stomach/Bowel: Normal caliber large and small bowel. No bowel wall thickening. The appendix is not visualized. Postoperative change of gastric bypass. Vascular/Lymphatic: No significant vascular findings are present. No enlarged abdominal or pelvic lymph nodes. Reproductive: Unremarkable. Other: No free intraperitoneal fluid or air. Musculoskeletal: No acute fracture. IMPRESSION: No acute abnormality in the abdomen or pelvis. Electronically Signed   By: Minerva Fester M.D.   On: 07/18/2023 01:35    Procedures Procedures    Medications Ordered in ED Medications  morphine (PF) 4 MG/ML injection 4 mg (4 mg Intravenous Given 07/18/23 0052)  ondansetron (ZOFRAN) injection 4 mg (4 mg Intravenous Given 07/18/23 0048)  pantoprazole (PROTONIX) injection 40 mg (40 mg Intravenous Given 07/18/23 0056)  iohexol (OMNIPAQUE) 300 MG/ML solution 100 mL (100  mLs Intravenous Contrast Given 07/18/23 0108)    ED Course/ Medical Decision Making/ A&P                                 Medical Decision Making Amount and/or Complexity of Data Reviewed Labs: ordered. Radiology: ordered.  Risk Prescription drug management.   This patient presents to the ED for concern of abdominal and flank pain, this involves an extensive number of treatment options, and is a complaint that carries with it a high risk of complications and morbidity.  I considered the following differential and admission for this acute, potentially life threatening condition.  The differential diagnosis includes kidney stone, pyelonephritis, pancreatitis, gastritis, peptic ulcer, colitis, viral illness  MDM:    This is a 37 year old male who presents with abdominal and flank pain.  He is overall nontoxic and vital signs are reassuring.  He is some tenderness on exam without signs of peritonitis.  Recent workup reviewed with negative CT imaging for stones.  Today lab work is only notable for slightly elevated lipase at 55.  Patient does drink and has a history of gastric bypass which would increase his risk for gastritis and peptic ulcers.  Today CT scan with contrast does not show any evidence of pancreatitis or complicated pseudocyst or any other abdominal pathology.  Suspect this may be gastritis versus peptic ulcer.  Will add Carafate and Zofran to his regimen.  Recommend clinic follow-up with his gastroenterologist.  (Labs, imaging, consults)  Labs: I Ordered, and personally interpreted labs.  The pertinent results include: CBC, CMP, lipase, urinalysis  Imaging Studies ordered: I ordered imaging studies including CT abdomen with contrast I independently visualized and interpreted imaging. I agree with the radiologist interpretation  Additional history obtained from chart review.  External records from outside source obtained and reviewed including prior evaluations  Cardiac  Monitoring: The patient was maintained on a cardiac monitor.  If on the cardiac monitor, I personally viewed and interpreted the cardiac monitored which showed an underlying rhythm of: Sinus rhythm  Reevaluation: After the interventions noted above, I reevaluated the patient and found that they have :improved  Social Determinants of Health:  lives independently  Disposition: Discharge  Co morbidities that complicate the patient evaluation  Past Medical History:  Diagnosis Date   Abscess    Right forearm healing   ADHD (  attention deficit hyperactivity disorder)    Anal fissure    Anxiety    Arthritis    back, shoulders, ankles, knees   Back pain    Bipolar disorder (HCC)    Blood in stool 04/06/2009   Centricity Description: HEMATOCHEZIA Qualifier: Diagnosis of  By: De Blanch.  Centricity Description: MELENA Qualifier: Diagnosis of  By: De Blanch    Chest pain    Chronic lower back pain    Class 3 severe obesity with serious comorbidity and body mass index (BMI) of 50.0 to 59.9 in adult (HCC) 05/03/2012   Constipation    Depression    Esophagitis    Distal esophageal erosions consistent with mild erosive  reflux esophagitis 12/2008 by EGD    External hemorrhoids    Gastric polyps    Gastric ulcer    GERD (gastroesophageal reflux disease)    Hepatic steatosis    Hiatal hernia    History of stomach ulcers    Hx MRSA infection 06/2007   right thigh   Hx of colonic polyps    Hypercholesteremia    denies   Hypothyroidism    Internal hemorrhoids    Irritable bowel syndrome (IBS)    Lower abdominal pain 03/07/2009   Qualifier: Diagnosis of  By: Minna Merritts     Obesity    Occult GI bleeding 12/2008   Trivial upper GI bleed/uncontrolled GERD by upper endoscopy 12/2008, normal f/u endoscopy 03/2009 with SBCE at that time   OSA on CPAP    Pain management    Pneumonia 09/01/2015   "on ATB still" (09/04/2015)   S/P colonoscopy 11/10, 10/08   Dr  Elmer Ramp   Schizophrenia [facility])    Scrotal pain 04/26/2022   Sleep apnea    Stomach ulcer    Syncope and collapse 05/03/2012   Thyroid function test abnormal    Noted in 2011 discharge   Tobacco dipper    Wears contact lenses      Medicines Meds ordered this encounter  Medications   morphine (PF) 4 MG/ML injection 4 mg   ondansetron (ZOFRAN) injection 4 mg   pantoprazole (PROTONIX) injection 40 mg   iohexol (OMNIPAQUE) 300 MG/ML solution 100 mL   sucralfate (CARAFATE) 1 g tablet    Sig: Take 1 tablet (1 g total) by mouth 4 (four) times daily -  with meals and at bedtime.    Dispense:  90 tablet    Refill:  0   ondansetron (ZOFRAN-ODT) 4 MG disintegrating tablet    Sig: Take 1 tablet (4 mg total) by mouth every 8 (eight) hours as needed for nausea or vomiting.    Dispense:  20 tablet    Refill:  0    I have reviewed the patients home medicines and have made adjustments as needed  Problem List / ED Course: Problem List Items Addressed This Visit   None Visit Diagnoses     Epigastric pain    -  Primary                   Final Clinical Impression(s) / ED Diagnoses Final diagnoses:  Epigastric pain    Rx / DC Orders ED Discharge Orders          Ordered    sucralfate (CARAFATE) 1 g tablet  3 times daily with meals & bedtime        07/18/23 0203    ondansetron (ZOFRAN-ODT) 4 MG disintegrating tablet  Every 8 hours  PRN        07/18/23 4403              Shon Baton, MD 07/18/23 [phone]

## 2023-07-21 ENCOUNTER — Telehealth: Payer: Self-pay

## 2023-07-21 NOTE — Telephone Encounter (Signed)
Appointment is scheduled with Quentin Mulling, PA on 09/25/23 at 11:00 AM. Patient made aware.   Cirigliano, Verlin Dike, DO  [place], [place], [place] Patient was seen in the ER for abdominal pain.  Will need follow-up  with me or one of the APP's.

## 2023-07-31 ENCOUNTER — Telehealth: Payer: Self-pay

## 2023-07-31 NOTE — Telephone Encounter (Signed)
Transition Care Management Follow-up Telephone Call Date of discharge and from where: [facility] 8/23 How have you been since you were released from the hospital? Doing ok and has not followed up with providers Any questions or concerns? No  Items Reviewed: Did the pt receive and understand the discharge instructions provided? Yes  Medications obtained and verified? Yes  Other? No  Any new allergies since your discharge? No  Dietary orders reviewed? No Do you have support at home? Yes     Follow up appointments reviewed:  PCP Hospital f/u appt confirmed? No  Scheduled to see  on  @ . Specialist Hospital f/u appt confirmed? No  Scheduled to see  on  @  Are transportation arrangements needed? No  If their condition worsens, is the pt aware to call PCP or go to the Emergency Dept.? Yes Was the patient provided with contact information for the PCP's office or ED? Yes Was to pt encouraged to call back with questions or concerns? Yes

## 2023-08-05 DIAGNOSIS — K5900 Constipation, unspecified: Secondary | ICD-10-CM | POA: Diagnosis not present

## 2023-08-05 DIAGNOSIS — Z809 Family history of malignant neoplasm, unspecified: Secondary | ICD-10-CM | POA: Diagnosis not present

## 2023-08-05 DIAGNOSIS — E039 Hypothyroidism, unspecified: Secondary | ICD-10-CM | POA: Diagnosis not present

## 2023-08-05 DIAGNOSIS — Z886 Allergy status to analgesic agent status: Secondary | ICD-10-CM | POA: Diagnosis not present

## 2023-08-05 DIAGNOSIS — K219 Gastro-esophageal reflux disease without esophagitis: Secondary | ICD-10-CM | POA: Diagnosis not present

## 2023-08-05 DIAGNOSIS — Z6837 Body mass index (BMI) 37.0-37.9, adult: Secondary | ICD-10-CM | POA: Diagnosis not present

## 2023-08-05 DIAGNOSIS — Z833 Family history of diabetes mellitus: Secondary | ICD-10-CM | POA: Diagnosis not present

## 2023-08-05 DIAGNOSIS — M1990 Unspecified osteoarthritis, unspecified site: Secondary | ICD-10-CM | POA: Diagnosis not present

## 2023-08-05 DIAGNOSIS — F319 Bipolar disorder, unspecified: Secondary | ICD-10-CM | POA: Diagnosis not present

## 2023-08-05 DIAGNOSIS — M62838 Other muscle spasm: Secondary | ICD-10-CM | POA: Diagnosis not present

## 2023-08-05 DIAGNOSIS — Z87892 Personal history of anaphylaxis: Secondary | ICD-10-CM | POA: Diagnosis not present

## 2023-09-07 ENCOUNTER — Other Ambulatory Visit: Payer: Self-pay | Admitting: Family Medicine

## 2023-09-07 DIAGNOSIS — E559 Vitamin D deficiency, unspecified: Secondary | ICD-10-CM

## 2023-09-08 ENCOUNTER — Other Ambulatory Visit: Payer: Self-pay

## 2023-09-08 ENCOUNTER — Emergency Department (HOSPITAL_BASED_OUTPATIENT_CLINIC_OR_DEPARTMENT_OTHER): Payer: 59

## 2023-09-08 ENCOUNTER — Emergency Department (HOSPITAL_BASED_OUTPATIENT_CLINIC_OR_DEPARTMENT_OTHER)
Admission: EM | Admit: 2023-09-08 | Discharge: 2023-09-08 | Disposition: A | Discharge status: Home / Self Care | Payer: 59 | Attending: Emergency Medicine | Admitting: Emergency Medicine

## 2023-09-08 DIAGNOSIS — W228XXA Striking against or struck by other objects, initial encounter: Secondary | ICD-10-CM | POA: Diagnosis not present

## 2023-09-08 DIAGNOSIS — S0990XA Unspecified injury of head, initial encounter: Secondary | ICD-10-CM | POA: Diagnosis not present

## 2023-09-08 NOTE — ED Notes (Signed)
[place] consulted. No new orders at this time.

## 2023-09-08 NOTE — ED Provider Notes (Signed)
[name] EMERGENCY DEPARTMENT AT [facility] Provider Note   CSN: [id number] Arrival date & time: 09/08/23  1601     History  Chief Complaint  Patient presents with   Head Injury    [name] is a 37 y.o. male history of bipolar, ADHD, GERD, status post Roux-en-Y, stomach ulcer presented after hitting his head 3 days ago.  Patient was working on a car when he stood up and hit the left side of his head.  Patient states is not allowed to take Tylenol due to fatty liver and not wanted to take ibuprofen due to stomach ulcer.  Patient has used ice sparingly and states that the pain is gotten worse.  Patient has visual changes although in the triage note he does note visual changes.  Patient denies neck pain, jaw pain, change in sensation/motor skills, new onset weakness, abdominal pain, nauseous vomiting, LOC, blood thinners, bleeding disorders.  Home Medications Prior to Admission medications   Medication Sig Start Date End Date Taking? Authorizing Provider  amphetamine-dextroamphetamine (ADDERALL) 20 MG tablet Take 1 tablet (20 mg total) by mouth daily. 06/26/23   Raliegh Ip, DO  amphetamine-dextroamphetamine (ADDERALL) 20 MG tablet Take 1 tablet (20 mg total) by mouth daily. 07/25/23   Raliegh Ip, DO  amphetamine-dextroamphetamine (ADDERALL) 20 MG tablet Take 1 tablet (20 mg total) by mouth daily. 08/25/23   Raliegh Ip, DO  Calcium Carbonate (CALCIUM 600 PO) Take 600 mg by mouth in the morning, at noon, and at bedtime.    [provider]  cyclobenzaprine (FLEXERIL) 10 MG tablet Take 1 tablet by mouth three times daily as needed for muscle spasm 03/24/23   Delynn Flavin M, DO  desloratadine (CLARINEX) 5 MG tablet Take 1 tablet (5 mg total) by mouth daily. For allergies 06/25/23   Delynn Flavin M, DO  docusate sodium (COLACE) 250 MG capsule Take 250 mg by mouth daily.    [provider]  EPINEPHrine 0.3 mg/0.3 mL IJ SOAJ  injection Inject 0.3 mg into the muscle as needed for anaphylaxis. 12/16/22   Raliegh Ip, DO  gabapentin (NEURONTIN) 300 MG capsule Take 1 capsule (300 mg total) by mouth 3 (three) times daily. 12/16/22   Raliegh Ip, DO  ketoconazole (NIZORAL) 2 % cream Apply 1 Application topically daily. X10-14 days per flare up of fungal rash 12/16/22   Delynn Flavin M, DO  lamoTRIgine (LAMICTAL) 100 MG tablet Take 1 tablet (100 mg total) by mouth 2 (two) times daily. *note dose change 06/25/23   Delynn Flavin M, DO  levothyroxine (SYNTHROID) 75 MCG tablet Take 1 tablet (75 mcg total) by mouth daily before breakfast. 12/16/22   Delynn Flavin M, DO  lidocaine (LIDODERM) 5 % Place 1 patch onto the skin daily. Remove & Discard patch within 12 hours or as directed by MD 07/05/23   Gareth Eagle, PA-C  Multiple Vitamins-Minerals (BARIATRIC MULTIVITAMINS/IRON PO) Take 1 tablet by mouth in the morning and at bedtime.    [provider]  ondansetron (ZOFRAN-ODT) 4 MG disintegrating tablet Dissolve 1 tablet (4 mg total) by mouth every 6 (six) hours as needed for nausea or vomiting. 06/06/21   Berna Bue, MD  ondansetron (ZOFRAN-ODT) 4 MG disintegrating tablet Take 1 tablet (4 mg total) by mouth every 8 (eight) hours as needed for nausea or vomiting. 07/18/23   Horton, Mayer Masker, MD  pantoprazole (PROTONIX) 40 MG tablet TAKE 1 TABLET BY MOUTH ONCE DAILY AS NEEDED FOR  HEARTBURN  OR  INDIGESTION 12/13/22   Delynn Flavin M, DO  Rimegepant Sulfate (NURTEC) 75 MG TBDP Take 1 tablet by mouth daily as needed (migraine). 02/20/22   Raliegh Ip, DO  sucralfate (CARAFATE) 1 g tablet Take 1 tablet (1 g total) by mouth 4 (four) times daily -  with meals and at bedtime. 07/18/23   Horton, Mayer Masker, MD  Vitamin D, Ergocalciferol, (DRISDOL) 1.25 MG (50000 UNIT) CAPS capsule TAKE 1 CAPSULE BY MOUTH EVERY THURSDAY 03/31/23   Delynn Flavin M, DO      Allergies    Ibuprofen, Influenza  vaccines, Tylenol [acetaminophen], Bee venom, and Tramadol    Review of Systems   Review of Systems  Physical Exam Updated Vital Signs BP 120/66 (BP Location: Right Arm)   Pulse 94   Temp 98.5 F (36.9 C) (Oral)   Resp 16   Ht 5\' 10"  (1.778 m)   Wt 120.2 kg   SpO2 97%   BMI 38.02 kg/m  Physical Exam Vitals reviewed.  Constitutional:      General: He is not in acute distress. HENT:     Head: Normocephalic.     Comments: Small hematoma noted to left forehead    Right Ear: Tympanic membrane, ear canal and external ear normal.     Left Ear: Tympanic membrane, ear canal and external ear normal.     Mouth/Throat:     Mouth: Mucous membranes are moist.  Eyes:     Extraocular Movements: Extraocular movements intact.     Conjunctiva/sclera: Conjunctivae normal.     Pupils: Pupils are equal, round, and reactive to light.  Cardiovascular:     Rate and Rhythm: Normal rate and regular rhythm.     Pulses: Normal pulses.     Heart sounds: Normal heart sounds.     Comments: 2+ bilateral radial/dorsalis pedis pulses with regular rate Pulmonary:     Effort: Pulmonary effort is normal. No respiratory distress.     Breath sounds: Normal breath sounds.  Abdominal:     Palpations: Abdomen is soft.     Tenderness: There is no abdominal tenderness. There is no guarding or rebound.  Musculoskeletal:        General: Normal range of motion.     Cervical back: Normal range of motion and neck supple.     Comments: 5 out of 5 bilateral grip/leg extension strength  Skin:    General: Skin is warm and dry.     Capillary Refill: Capillary refill takes less than 2 seconds.  Neurological:     General: No focal deficit present.     Mental Status: He is alert and oriented to person, place, and time.     Sensory: Sensation is intact.     Motor: Motor function is intact.     Coordination: Coordination is intact.     Gait: Gait is intact.     Comments: Sensation intact in all 4 limbs Cranial nerves  III through XII intact Vision grossly intact  Psychiatric:        Mood and Affect: Mood normal.     ED Results / Procedures / Treatments   Labs (all labs ordered are listed, but only abnormal results are displayed) Labs Reviewed - No data to display  EKG None  Radiology CT Head Wo Contrast  Result Date: 09/08/2023 CLINICAL DATA:  Status post trauma. EXAM: CT HEAD WITHOUT CONTRAST TECHNIQUE: Contiguous axial images were obtained from the base of the skull through the vertex  without intravenous contrast. RADIATION DOSE REDUCTION: This exam was performed according to the departmental dose-optimization program which includes automated exposure control, adjustment of the mA and/or kV according to patient size and/or use of iterative reconstruction technique. COMPARISON:  July 29, 2022 FINDINGS: Brain: No evidence of acute infarction, hemorrhage, hydrocephalus, extra-axial collection or mass lesion/mass effect. Vascular: No hyperdense vessel or unexpected calcification. Skull: Normal. Negative for fracture or focal lesion. Sinuses/Orbits: No acute finding. Other: None. IMPRESSION: No acute intracranial abnormality. Electronically Signed   By: Aram Candela M.D.   On: 09/08/2023 22:11    Procedures Procedures    Medications Ordered in ED Medications - No data to display  ED Course/ Medical Decision Making/ A&P                                 Medical Decision Making Amount and/or Complexity of Data Reviewed Radiology: ordered.   [name] 37 y.o. presented today for head injury. Working DDx that I considered at this time includes, but not limited to, hematoma, contusion, ICH, subdural/epidural hematoma, basilar skull fracture.  R/o DDx: contusion, ICH, subdural/epidural hematoma, basilar skull fracture: These are considered less likely due to history of present illness, physical exam, labs/imaging findings  Review of prior external notes: 07/18/2023 ED  Unique  Tests and My Interpretation:  CT without contrast: No acute changes  Discussion with Independent Historian:  Wife  Discussion of Management of Tests: None  Risk: Low: based on diagnostic testing/clinical impression and treatment plan  Risk Stratification Score: None  Plan: On exam patient was in no acute distress stable vitals.  On exam patient does have small hematoma on his head however he was neurologically intact along with the rest of his physical exam.  I spoke to the patient and the wife and they want a CT scan as there is concern the patient.  CT scan ordered although I believe patient most likely just has a hematoma.  I offered the patient Tylenol however patient states he cannot have this and I offer the patient ice however patient declined at this time.  Anticipate discharge after CT scan with primary care provider.  CT negative.  Will discharge her primary care follow-up along with ice and rest for the next few days.  Patient was given return precautions. Patient stable for discharge at this time.  Patient verbalized understanding of plan.         Final Clinical Impression(s) / ED Diagnoses Final diagnoses:  Injury of head, initial encounter    Rx / DC Orders ED Discharge Orders     None         Remi Deter 09/08/23 2228    Benjiman Core, MD 09/08/23 2324

## 2023-09-08 NOTE — Telephone Encounter (Signed)
OTC 400 international IU  daily

## 2023-09-08 NOTE — Telephone Encounter (Signed)
Do you want him to continue with rx Vitamin D or change to OTC?
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
## 2023-09-08 NOTE — Discharge Instructions (Signed)
Please follow-up with your primary care provider in regards to his symptoms and ER visit.  Today your CT scan was negative and the rest your physical exam was reassuring as well.  You most likely have a bruise on your head causing your symptoms.  Please ice your head and rest for the next few days.  If symptoms change or worsen please return to ER.

## 2023-09-23 ENCOUNTER — Telehealth (INDEPENDENT_AMBULATORY_CARE_PROVIDER_SITE_OTHER): Payer: 59 | Admitting: Family

## 2023-09-23 ENCOUNTER — Encounter: Payer: Self-pay | Admitting: Family

## 2023-09-23 DIAGNOSIS — R6889 Other general symptoms and signs: Secondary | ICD-10-CM

## 2023-09-23 MED ORDER — BENZONATATE 200 MG PO CAPS
200.0000 mg | ORAL_CAPSULE | Freq: Three times a day (TID) | ORAL | 1 refills | Status: DC | PRN
Start: 2023-09-23 — End: 2023-12-23

## 2023-09-23 MED ORDER — FLUTICASONE PROPIONATE 50 MCG/ACT NA SUSP
2.0000 | Freq: Every day | NASAL | 6 refills | Status: AC
Start: 2023-09-23 — End: ?

## 2023-09-23 MED ORDER — PROMETHAZINE-DM 6.25-15 MG/5ML PO SYRP
5.0000 mL | ORAL_SOLUTION | Freq: Three times a day (TID) | ORAL | 0 refills | Status: DC | PRN
Start: 2023-09-23 — End: 2023-12-23

## 2023-09-23 MED ORDER — CETIRIZINE HCL 10 MG PO TABS
10.0000 mg | ORAL_TABLET | Freq: Every day | ORAL | 1 refills | Status: DC
Start: 2023-09-23 — End: 2023-12-23

## 2023-09-23 NOTE — Patient Instructions (Signed)
Viral Illness, Adult Viruses are tiny germs that can get into a person's body and cause illness. There are many different types of viruses. And they cause many types of illness. Viral illnesses can range from mild to severe. They can affect various parts of the body. Short-term conditions that are caused by a virus include colds and flu (influenza) and stomach viruses. Long-term conditions that are caused by a virus include herpes, shingles, and human immunodeficiency virus (HIV) infection. A few viruses have been linked to certain cancers. What are the causes? Many types of viruses can cause illness. Viruses get into cells in your body, multiply, and cause the infected cells to work differently or die. When these cells die, they release more of the virus. When this happens, you get symptoms of the illness and the virus spreads to other cells. If the virus takes over how the cell works, it can cause the cell to divide and grow out of control. This happens when a virus causes cancer. Different viruses get into the body in different ways. You can get a virus by: Swallowing food or water that has come in contact with the virus. Breathing in droplets that have been coughed or sneezed into the air by an infected person. Touching a surface that has the virus on it and then touching your eyes, nose, or mouth. Being bitten by an insect or animal that carries the virus. Having sexual contact with a person who is infected with the virus. Being exposed to blood or fluids that contain the virus, either through an open cut or during a transfusion. If a virus enters your body, your body's disease-fighting system (immune system) will try to fight the virus. You may be at higher risk for a viral illness if your immune system is weak. What are the signs or symptoms? Symptoms depend on the type of virus and the location of the cells that it gets into. Symptoms can include: For cold and flu  viruses: Fever. Headache. Sore throat. Muscle aches. Stuffy nose (nasal congestion). Cough. For stomach (gastrointestinal) viruses: Fever. Pain in the abdomen. Nausea or vomiting. Diarrhea. For liver viruses (hepatitis): Loss of appetite. Feeling tired. Skin or the white parts of your eyes turning yellow (jaundice). For brain and spinal cord viruses: Fever. Headache. Stiff neck. Nausea and vomiting. Confusion or being sleepy. For skin viruses: Warts. Itching. Rash. For sexually transmitted viruses: Discharge. Swelling. Redness. Rash. How is this diagnosed? This condition may be diagnosed based on one or more of these: Your symptoms and medical history. A physical exam. Tests, such as: Blood tests. Tests on a sample of mucus from your lungs (sputum sample). Tests on a poop (stool) sample. Tests on a swab of body fluids or a skin sore (lesion). How is this treated? Viruses can be hard to treat because they live within cells. Antibiotics do not treat viruses because these medicines do not get inside cells. Treatment for a viral illness may include: Resting and drinking a lot of fluids. Medicines to treat symptoms. These can include over-the-counter medicine for pain and fever, medicines for cough or congestion, and medicines for diarrhea. Antiviral medicines. These medicines are available only for certain types of viruses. Some viral illnesses can be prevented with vaccinations. A common example is the flu shot. Follow these instructions at home: Medicines Take over-the-counter and prescription medicines only as told by your health care provider. If you were prescribed an antiviral medicine, take it as told by your provider. Do not stop  taking the antiviral even if you start to feel better. Know when antibiotics are needed and when they are not needed. Antibiotics do not treat viruses. You may get an antibiotic if your provider thinks that you may have, or are at risk  for, a bacterial infection and you have a viral infection. Do not ask for an antibiotic prescription if you have been diagnosed with a viral illness. Antibiotics will not make your illness go away faster. Taking antibiotics when they are not needed can lead to antibiotic resistance. When this develops, the medicine no longer works against the bacteria that it normally fights. General instructions Drink enough fluids to keep your pee (urine) pale yellow. Rest as much as possible. Return to your normal activities as told by your provider. Ask your provider what activities are safe for you. How is this prevented? To lower your risk of getting another viral illness: Wash your hands often with soap and water for at least 20 seconds. If soap and water are not available, use hand sanitizer. Avoid touching your nose, eyes, and mouth, especially if you have not washed your hands recently. If anyone in your household has a viral infection, clean all household surfaces that may have been in contact with the virus. Use soap and hot water. You may also use a commercially prepared, bleach-containing solution. Stay away from people who are sick with symptoms of a viral infection. Do not share items such as toothbrushes and water bottles with other people. Keep your vaccinations up to date. This includes getting a yearly flu shot. Eat a healthy diet and get plenty of rest. Contact a health care provider if: You have symptoms of a viral illness that do not go away. Your symptoms come back after going away. Your symptoms get worse. Get help right away if: You have trouble breathing. You have a severe headache or a stiff neck. You have severe vomiting or pain in your abdomen. These symptoms may be an emergency. Get help right away. Call 911. Do not wait to see if the symptoms will go away. Do not drive yourself to the hospital. This information is not intended to replace advice given to you by your health  care provider. Make sure you discuss any questions you have with your health care provider. Document Revised: 11/27/2022 Document Reviewed: 09/11/2022 Elsevier Patient Education  2024 [employer].

## 2023-09-23 NOTE — Progress Notes (Signed)
Virtual Visit Consent   [name], you are scheduled for a virtual visit with a [name] provider today. Just as with appointments in the office, your consent must be obtained to participate. Your consent will be active for this visit and any virtual visit you may have with one of our providers in the next 365 days. If you have a MyChart account, a copy of this consent can be sent to you electronically.  As this is a virtual visit, video technology does not allow for your provider to perform a traditional examination. This may limit your provider's ability to fully assess your condition. If your provider identifies any concerns that need to be evaluated in person or the need to arrange testing (such as labs, EKG, etc.), we will make arrangements to do so. Although advances in technology are sophisticated, we cannot ensure that it will always work on either your end or our end. If the connection with a video visit is poor, the visit may have to be switched to a telephone visit. With either a video or telephone visit, we are not always able to ensure that we have a secure connection.  By engaging in this virtual visit, you consent to the provision of healthcare and authorize for your insurance to be billed (if applicable) for the services provided during this visit. Depending on your insurance coverage, you may receive a charge related to this service.  I need to obtain your verbal consent now. Are you willing to proceed with your visit today? [name] has provided verbal consent on 09/23/2023 for a virtual visit (video or telephone). [name], FNP  Date: 09/23/2023 1:26 PM  Virtual Visit via Video Note   I, [name], connected with  MURILO Gardner  ([id number], Oct 16, 1986) on 09/23/23 at  4:45 PM EDT by a video-enabled telemedicine application and verified that I am speaking with the correct person using two identifiers.  Location: Patient: Virtual Visit Location  Patient: Home Provider: Virtual Visit Location Provider:  Office   I discussed the limitations of evaluation and management by telemedicine and the availability of in person appointments. The patient expressed understanding and agreed to proceed.    History of Present Illness: [name] is a 37 y.o. who identifies as a male who was assigned male at birth, and is being seen today for body aches, fever, sore throat, ear pain, and rhinorrhea that started two days. He did a home COVID test that was negative.   HPI: URI  This is a new problem. The current episode started in the past 7 days. The problem has been unchanged. The maximum temperature recorded prior to his arrival was 101 - 101.9 F. Associated symptoms include congestion, coughing, ear pain, headaches, joint pain, rhinorrhea, sinus pain, sneezing and a sore throat. Pertinent negatives include no nausea or swollen glands. He has tried acetaminophen and increased fluids for the symptoms. The treatment provided mild relief.    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   Seasonal and perennial allergic rhinitis 07/23/2014   Obstructive sleep apnea 05/03/2012  BIPOLAR DISORDER UNSPECIFIED 03/06/2009   SMOKELESS TOBACCO ABUSE 03/06/2009   Attention deficit hyperactivity disorder (ADHD) 03/06/2009    Allergies:  Allergies  Allergen Reactions   Ibuprofen Other (See Comments)    Makes ulcers bleed   Influenza Vaccines Shortness Of Breath and Other (See Comments)    Rash and unable to breathe well   Tylenol [Acetaminophen] Hives   Bee Venom  Swelling    SWELLING REACTION UNSPECIFIED    Tramadol Itching   Medications:  Current Outpatient Medications:    benzonatate (TESSALON) 200 MG capsule, Take 1 capsule (200 mg total) by mouth 3 (three) times daily as needed., Disp: 30 capsule, Rfl: 1   cetirizine (ZYRTEC ALLERGY) 10 MG tablet, Take 1 tablet (10 mg total) by mouth daily., Disp: 90 tablet, Rfl: 1   fluticasone (FLONASE) 50 MCG/ACT nasal spray, Place 2 sprays into both nostrils daily., Disp: 16 g, Rfl: 6   promethazine-dextromethorphan (PROMETHAZINE-DM) 6.25-15 MG/5ML syrup, Take 5 mLs by mouth 3 (three) times daily as needed for cough., Disp: 118 mL, Rfl: 0   amphetamine-dextroamphetamine (ADDERALL) 20 MG tablet, Take 1 tablet (20 mg total) by mouth daily., Disp: 30 tablet, Rfl: 0   amphetamine-dextroamphetamine (ADDERALL) 20 MG tablet, Take 1 tablet (20 mg total) by mouth daily., Disp: 30 tablet, Rfl: 0   amphetamine-dextroamphetamine (ADDERALL) 20 MG tablet, Take 1 tablet (20 mg total) by mouth daily., Disp: 30 tablet, Rfl: 0   Calcium Carbonate (CALCIUM 600 PO), Take 600 mg by mouth in the morning, at noon, and at bedtime., Disp: , Rfl:    cyclobenzaprine (FLEXERIL) 10 MG tablet, Take 1 tablet by mouth three times daily as needed for muscle spasm, Disp: 90 tablet, Rfl: 6   desloratadine (CLARINEX) 5 MG tablet, Take 1 tablet (5 mg total) by mouth daily. For allergies, Disp: 90 tablet, Rfl: 3   docusate sodium (COLACE) 250 MG capsule, Take 250 mg by mouth daily., Disp: , Rfl:    EPINEPHrine 0.3 mg/0.3 mL IJ SOAJ injection, Inject 0.3 mg into the muscle as needed for anaphylaxis., Disp: 1 each, Rfl: 0   gabapentin (NEURONTIN) 300 MG capsule, Take 1 capsule (300 mg total) by mouth 3 (three) times daily., Disp: 270 capsule, Rfl: 3   ketoconazole (NIZORAL) 2 % cream, Apply 1 Application topically daily. X10-14 days per flare up of fungal rash, Disp: 60 g, Rfl: 1   lamoTRIgine (LAMICTAL) 100 MG tablet, Take 1 tablet (100 mg total) by  mouth 2 (two) times daily. *note dose change, Disp: 180 tablet, Rfl: 1   levothyroxine (SYNTHROID) 75 MCG tablet, Take 1 tablet (75 mcg total) by mouth daily before breakfast., Disp: 90 tablet, Rfl: 3   lidocaine (LIDODERM) 5 %, Place 1 patch onto the skin daily. Remove & Discard patch within 12 hours or as directed by MD, Disp: 30 patch, Rfl: 0   Multiple Vitamins-Minerals (BARIATRIC MULTIVITAMINS/IRON PO), Take 1 tablet by mouth in the morning and at bedtime., Disp: , Rfl:    ondansetron (ZOFRAN-ODT) 4 MG disintegrating tablet, Dissolve 1 tablet (4 mg total) by mouth every 6 (six) hours as needed for nausea or vomiting., Disp: 20 tablet, Rfl: 0   ondansetron (ZOFRAN-ODT) 4 MG disintegrating tablet, Take 1 tablet (4 mg total) by mouth every 8 (eight) hours as needed for nausea or vomiting., Disp: 20 tablet, Rfl: 0   pantoprazole (PROTONIX) 40 MG tablet, TAKE 1 TABLET BY MOUTH ONCE DAILY AS NEEDED FOR  HEARTBURN  OR  INDIGESTION, Disp: 90 tablet, Rfl:  0   Rimegepant Sulfate (NURTEC) 75 MG TBDP, Take 1 tablet by mouth daily as needed (migraine)., Disp: 4 tablet, Rfl: 0   sucralfate (CARAFATE) 1 g tablet, Take 1 tablet (1 g total) by mouth 4 (four) times daily -  with meals and at bedtime., Disp: 90 tablet, Rfl: 0   Vitamin D, Ergocalciferol, (DRISDOL) 1.25 MG (50000 UNIT) CAPS capsule, TAKE 1 CAPSULE BY MOUTH EVERY THURSDAY, Disp: 12 capsule, Rfl: 1  Observations/Objective: Patient is well-developed, well-nourished in no acute distress.  Resting comfortably  at home.  Head is normocephalic, atraumatic.  No labored breathing.  Speech is clear and coherent with logical content.  Patient is alert and oriented at baseline.  Nasal congestion   Assessment and Plan: 1. Flu-like symptoms - cetirizine (ZYRTEC ALLERGY) 10 MG tablet; Take 1 tablet (10 mg total) by mouth daily.  Dispense: 90 tablet; Refill: 1 - fluticasone (FLONASE) 50 MCG/ACT nasal spray; Place 2 sprays into both nostrils daily.   Dispense: 16 g; Refill: 6 - benzonatate (TESSALON) 200 MG capsule; Take 1 capsule (200 mg total) by mouth 3 (three) times daily as needed.  Dispense: 30 capsule; Refill: 1 - promethazine-dextromethorphan (PROMETHAZINE-DM) 6.25-15 MG/5ML syrup; Take 5 mLs by mouth 3 (three) times daily as needed for cough.  Dispense: 118 mL; Refill: 0  Rest Force fluids Tylenol as needed Start zyrtec, Flonase, and tessalon as needed Follow up if symptoms worsen or do not improve   Follow Up Instructions: I discussed the assessment and treatment plan with the patient. The patient was provided an opportunity to ask questions and all were answered. The patient agreed with the plan and demonstrated an understanding of the instructions.  A copy of instructions were sent to the patient via MyChart unless otherwise noted below.     The patient was advised to call back or seek an in-person evaluation if the symptoms worsen or if the condition fails to improve as anticipated.    [name], FNP

## 2023-09-24 ENCOUNTER — Telehealth (INDEPENDENT_AMBULATORY_CARE_PROVIDER_SITE_OTHER): Payer: 59 | Admitting: Family Medicine

## 2023-09-24 ENCOUNTER — Encounter: Payer: Self-pay | Admitting: Family Medicine

## 2023-09-24 DIAGNOSIS — F3177 Bipolar disorder, in partial remission, most recent episode mixed: Secondary | ICD-10-CM

## 2023-09-24 DIAGNOSIS — F902 Attention-deficit hyperactivity disorder, combined type: Secondary | ICD-10-CM

## 2023-09-24 DIAGNOSIS — M5126 Other intervertebral disc displacement, lumbar region: Secondary | ICD-10-CM | POA: Diagnosis not present

## 2023-09-24 MED ORDER — FLUOXETINE HCL 10 MG PO CAPS
10.0000 mg | ORAL_CAPSULE | Freq: Every day | ORAL | 0 refills | Status: DC
Start: 2023-09-24 — End: 2023-12-17

## 2023-09-24 MED ORDER — GABAPENTIN 300 MG PO CAPS
300.0000 mg | ORAL_CAPSULE | Freq: Three times a day (TID) | ORAL | 3 refills | Status: DC
Start: 2023-09-24 — End: 2024-08-16

## 2023-09-24 MED ORDER — AMPHETAMINE-DEXTROAMPHETAMINE 20 MG PO TABS: 20.0000 mg | ORAL_TABLET | Freq: Every day | ORAL | 0 refills | Status: DC

## 2023-09-24 MED ORDER — AMPHETAMINE-DEXTROAMPHETAMINE 20 MG PO TABS
20.0000 mg | ORAL_TABLET | Freq: Every day | ORAL | 0 refills | Status: DC
Start: 2023-10-05 — End: 2023-12-23

## 2023-09-24 MED ORDER — AMPHETAMINE-DEXTROAMPHETAMINE 20 MG PO TABS
20.0000 mg | ORAL_TABLET | Freq: Every day | ORAL | 0 refills | Status: DC
Start: 2023-12-04 — End: 2023-12-23

## 2023-09-24 MED ORDER — LEVOTHYROXINE SODIUM 75 MCG PO TABS: 75.0000 ug | ORAL_TABLET | Freq: Every day | ORAL | 3 refills | Status: DC

## 2023-09-24 NOTE — Progress Notes (Signed)
MyChart Video visit  Subjective: CC: ADHD PCP: [name], [name] [name] is a 37 y.o. male. Patient provides verbal consent for consult held via video.  Due to COVID-19 pandemic this visit was conducted virtually. This visit type was conducted due to national recommendations for restrictions regarding the COVID-19 Pandemic (e.g. social distancing, sheltering in place) in an effort to limit this patient's exposure and mitigate transmission in our community. All issues noted in this document were discussed and addressed.  A physical exam was not performed with this format.   Location of patient: Home Location of provider: [facility] Others present for call: Wife  1.  Bipolar disorder, ADHD Patient report stability of ADHD with current medications.  No difficulty falling asleep.  No heart palpitations, changes in bowel habits etc.  He does report that the increased dose of Lamictal seem to help a little bit with mood but he continues to have some anger outbursts.  His wife, who is present for the call today agrees that he needs a little more intervention.  He is still not sure that he wants to go see a psychiatrist yet.  Has previously been treated with Zoloft but never at the same time as Lamictal.   ROS: Per HPI  Allergies  Allergen Reactions   Ibuprofen Other (See Comments)    Makes ulcers bleed   Influenza Vaccines Shortness Of Breath and Other (See Comments)    Rash and unable to breathe well   Tylenol [Acetaminophen] Hives   Bee Venom Swelling    SWELLING REACTION UNSPECIFIED    Tramadol Itching   Past Medical History:  Diagnosis Date   Abscess    Right forearm healing   ADHD (attention deficit hyperactivity disorder)    Anal fissure    Anxiety    Arthritis    back, shoulders, ankles, knees   Back pain    Bipolar disorder (HCC)    Blood in stool 04/06/2009   Centricity Description: HEMATOCHEZIA Qualifier: Diagnosis of  By: De Blanch  Centricity  Description: MELENA Qualifier: Diagnosis of  By: De Blanch    Chest pain    Chronic lower back pain    Class 3 severe obesity with serious comorbidity and body mass index (BMI) of 50.0 to 59.9 in adult (HCC) 05/03/2012   Constipation    Depression    Esophagitis    Distal esophageal erosions consistent with mild erosive  reflux esophagitis 12/2008 by EGD    External hemorrhoids    Gastric polyps    Gastric ulcer    GERD (gastroesophageal reflux disease)    Hepatic steatosis    Hiatal hernia    History of stomach ulcers    Hx MRSA infection 06/2007   right thigh   Hx of colonic polyps    Hypercholesteremia    denies   Hypothyroidism    Internal hemorrhoids    Irritable bowel syndrome (IBS)    Lower abdominal pain 03/07/2009   Qualifier: Diagnosis of  By: Minna Merritts     Obesity    Occult GI bleeding 12/2008   Trivial upper GI bleed/uncontrolled GERD by upper endoscopy 12/2008, normal f/u endoscopy 03/2009 with [facility] at that time   OSA on CPAP    Pain management    Pneumonia 09/01/2015   "on ATB still" (09/04/2015)   S/P colonoscopy 11/10, 10/08   Dr Elmer Ramp   Schizophrenia [facility])    Scrotal pain 04/26/2022   Sleep apnea  Stomach ulcer    Syncope and collapse 05/03/2012   Thyroid function test abnormal    Noted in 2011 discharge   Tobacco dipper    Wears contact lenses     Current Outpatient Medications:    amphetamine-dextroamphetamine (ADDERALL) 20 MG tablet, Take 1 tablet (20 mg total) by mouth daily., Disp: 30 tablet, Rfl: 0   amphetamine-dextroamphetamine (ADDERALL) 20 MG tablet, Take 1 tablet (20 mg total) by mouth daily., Disp: 30 tablet, Rfl: 0   amphetamine-dextroamphetamine (ADDERALL) 20 MG tablet, Take 1 tablet (20 mg total) by mouth daily., Disp: 30 tablet, Rfl: 0   benzonatate (TESSALON) 200 MG capsule, Take 1 capsule (200 mg total) by mouth 3 (three) times daily as needed., Disp: 30 capsule, Rfl: 1   Calcium Carbonate (CALCIUM 600 PO),  Take 600 mg by mouth in the morning, at noon, and at bedtime., Disp: , Rfl:    cetirizine (ZYRTEC ALLERGY) 10 MG tablet, Take 1 tablet (10 mg total) by mouth daily., Disp: 90 tablet, Rfl: 1   cyclobenzaprine (FLEXERIL) 10 MG tablet, Take 1 tablet by mouth three times daily as needed for muscle spasm, Disp: 90 tablet, Rfl: 6   desloratadine (CLARINEX) 5 MG tablet, Take 1 tablet (5 mg total) by mouth daily. For allergies, Disp: 90 tablet, Rfl: 3   docusate sodium (COLACE) 250 MG capsule, Take 250 mg by mouth daily., Disp: , Rfl:    EPINEPHrine 0.3 mg/0.3 mL IJ SOAJ injection, Inject 0.3 mg into the muscle as needed for anaphylaxis., Disp: 1 each, Rfl: 0   fluticasone (FLONASE) 50 MCG/ACT nasal spray, Place 2 sprays into both nostrils daily., Disp: 16 g, Rfl: 6   gabapentin (NEURONTIN) 300 MG capsule, Take 1 capsule (300 mg total) by mouth 3 (three) times daily., Disp: 270 capsule, Rfl: 3   ketoconazole (NIZORAL) 2 % cream, Apply 1 Application topically daily. X10-14 days per flare up of fungal rash, Disp: 60 g, Rfl: 1   lamoTRIgine (LAMICTAL) 100 MG tablet, Take 1 tablet (100 mg total) by mouth 2 (two) times daily. *note dose change, Disp: 180 tablet, Rfl: 1   levothyroxine (SYNTHROID) 75 MCG tablet, Take 1 tablet (75 mcg total) by mouth daily before breakfast., Disp: 90 tablet, Rfl: 3   lidocaine (LIDODERM) 5 %, Place 1 patch onto the skin daily. Remove & Discard patch within 12 hours or as directed by MD, Disp: 30 patch, Rfl: 0   Multiple Vitamins-Minerals (BARIATRIC MULTIVITAMINS/IRON PO), Take 1 tablet by mouth in the morning and at bedtime., Disp: , Rfl:    ondansetron (ZOFRAN-ODT) 4 MG disintegrating tablet, Dissolve 1 tablet (4 mg total) by mouth every 6 (six) hours as needed for nausea or vomiting., Disp: 20 tablet, Rfl: 0   ondansetron (ZOFRAN-ODT) 4 MG disintegrating tablet, Take 1 tablet (4 mg total) by mouth every 8 (eight) hours as needed for nausea or vomiting., Disp: 20 tablet, Rfl: 0    pantoprazole (PROTONIX) 40 MG tablet, TAKE 1 TABLET BY MOUTH ONCE DAILY AS NEEDED FOR  HEARTBURN  OR  INDIGESTION, Disp: 90 tablet, Rfl: 0   promethazine-dextromethorphan (PROMETHAZINE-DM) 6.25-15 MG/5ML syrup, Take 5 mLs by mouth 3 (three) times daily as needed for cough., Disp: 118 mL, Rfl: 0   Rimegepant Sulfate (NURTEC) 75 MG TBDP, Take 1 tablet by mouth daily as needed (migraine)., Disp: 4 tablet, Rfl: 0   sucralfate (CARAFATE) 1 g tablet, Take 1 tablet (1 g total) by mouth 4 (four) times daily -  with meals and at  bedtime., Disp: 90 tablet, Rfl: 0   Vitamin D, Ergocalciferol, (DRISDOL) 1.25 MG (50000 UNIT) CAPS capsule, TAKE 1 CAPSULE BY MOUTH EVERY THURSDAY, Disp: 12 capsule, Rfl: 1 Gen: nontoxic appearing male, NAD Psych: mood stable, speech normal. Pleasant.   Assessment/ Plan: 37 y.o. male   Bipolar disorder, in partial remission, most recent episode mixed (HCC) - Plan: FLUoxetine (PROZAC) 10 MG capsule  Attention deficit hyperactivity disorder (ADHD), combined type - Plan: amphetamine-dextroamphetamine (ADDERALL) 20 MG tablet, amphetamine-dextroamphetamine (ADDERALL) 20 MG tablet, amphetamine-dextroamphetamine (ADDERALL) 20 MG tablet  Lumbar disc herniation - Plan: gabapentin (NEURONTIN) 300 MG capsule  While mood instability has gotten slightly better he continues to have symptoms despite increased dose of Lamictal.  Will trial low-dose Prozac in addition to current meds.  Discussed possibility of exacerbation of mania with SSRI's and to watch for signs and symptoms of that  ADHD is chronic and stable.  Up-to-date on UDS and CSC.  National narcotic database reviewed and there were no red flags.  Medications have been renewed  Did not discuss low back issues but was due for gabapentin renewal prior to her next visit so this was sent to pharmacy  Start time: 12:09p End time: 12:18p  Total time spent on patient care (including video visit/ documentation): 11 minutes  Phillip Check Hulen Skains, [name] [facility] [phone]

## 2023-09-25 ENCOUNTER — Ambulatory Visit: Payer: 59 | Admitting: Physician Assistant

## 2023-10-03 ENCOUNTER — Telehealth: Payer: Self-pay | Admitting: Family Medicine

## 2023-10-03 NOTE — Telephone Encounter (Signed)
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 Pt aware that he will be called once forms are completed and ready for pick up. Pt requested these forms be filled out asap. Made pt aware that per form, PCP has 45 days from todays date to complete these. Pt still wants them filled out asap. Made pt aware that PCP comes back from vacation on 10/14/23.

## 2023-10-14 ENCOUNTER — Telehealth: Payer: Self-pay | Admitting: Family Medicine

## 2023-10-14 NOTE — Telephone Encounter (Signed)
Copied from CRM [id number]. Topic: General - Other >> Oct 14, 2023 10:05 AM [place] Nickel wrote: Reason for EAV:[id number] calling regarding document he has drop off for doctor to fill out for him, would like to know if the document is ready for him. Contact info [phone]

## 2023-10-14 NOTE — Telephone Encounter (Signed)
They are in my outgoing box and are ready

## 2023-10-15 NOTE — Telephone Encounter (Signed)
LMOV[facility] paperwork ready for pickup

## 2023-11-27 ENCOUNTER — Ambulatory Visit: Payer: 59 | Admitting: Physician Assistant

## 2023-12-17 ENCOUNTER — Other Ambulatory Visit: Payer: Self-pay | Admitting: Family Medicine

## 2023-12-17 DIAGNOSIS — F3177 Bipolar disorder, in partial remission, most recent episode mixed: Secondary | ICD-10-CM

## 2023-12-23 ENCOUNTER — Ambulatory Visit (INDEPENDENT_AMBULATORY_CARE_PROVIDER_SITE_OTHER): Payer: 59 | Admitting: Family Medicine

## 2023-12-23 VITALS — BP 109/64 | HR 76 | Temp 97.9°F | Ht 70.0 in | Wt 274.0 lb

## 2023-12-23 DIAGNOSIS — E039 Hypothyroidism, unspecified: Secondary | ICD-10-CM | POA: Diagnosis not present

## 2023-12-23 DIAGNOSIS — Z79899 Other long term (current) drug therapy: Secondary | ICD-10-CM | POA: Diagnosis not present

## 2023-12-23 DIAGNOSIS — F902 Attention-deficit hyperactivity disorder, combined type: Secondary | ICD-10-CM

## 2023-12-23 DIAGNOSIS — E559 Vitamin D deficiency, unspecified: Secondary | ICD-10-CM

## 2023-12-23 DIAGNOSIS — M5126 Other intervertebral disc displacement, lumbar region: Secondary | ICD-10-CM

## 2023-12-23 DIAGNOSIS — H6991 Unspecified Eustachian tube disorder, right ear: Secondary | ICD-10-CM

## 2023-12-23 DIAGNOSIS — F3177 Bipolar disorder, in partial remission, most recent episode mixed: Secondary | ICD-10-CM | POA: Diagnosis not present

## 2023-12-23 DIAGNOSIS — J3489 Other specified disorders of nose and nasal sinuses: Secondary | ICD-10-CM

## 2023-12-23 MED ORDER — DESLORATADINE 5 MG PO TABS: 5.0000 mg | ORAL_TABLET | Freq: Every day | ORAL | 3 refills | Status: AC

## 2023-12-23 MED ORDER — AMPHETAMINE-DEXTROAMPHETAMINE 20 MG PO TABS
20.0000 mg | ORAL_TABLET | Freq: Every day | ORAL | 0 refills | Status: DC
Start: 2024-01-09 — End: 2024-03-24

## 2023-12-23 MED ORDER — LAMOTRIGINE 100 MG PO TABS: 100.0000 mg | ORAL_TABLET | Freq: Two times a day (BID) | ORAL | 1 refills | Status: DC

## 2023-12-23 MED ORDER — AMPHETAMINE-DEXTROAMPHETAMINE 20 MG PO TABS
20.0000 mg | ORAL_TABLET | Freq: Every day | ORAL | 0 refills | Status: DC
Start: 2024-02-06 — End: 2024-03-24

## 2023-12-23 MED ORDER — CYCLOBENZAPRINE HCL 10 MG PO TABS: 10.0000 mg | ORAL_TABLET | Freq: Three times a day (TID) | ORAL | 6 refills | Status: AC | PRN

## 2023-12-23 MED ORDER — MUPIROCIN 2 % EX OINT: 1.0000 | TOPICAL_OINTMENT | Freq: Two times a day (BID) | CUTANEOUS | 0 refills | Status: AC

## 2023-12-23 MED ORDER — FLUOXETINE HCL 10 MG PO CAPS: 10.0000 mg | ORAL_CAPSULE | Freq: Every day | ORAL | 3 refills | Status: DC

## 2023-12-23 MED ORDER — AMPHETAMINE-DEXTROAMPHETAMINE 20 MG PO TABS: 20.0000 mg | ORAL_TABLET | Freq: Every day | ORAL | 0 refills | Status: DC
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
## 2023-12-24 ENCOUNTER — Encounter: Payer: Self-pay | Admitting: Family Medicine

## 2023-12-24 ENCOUNTER — Encounter (HOSPITAL_COMMUNITY): Payer: Self-pay | Admitting: *Deleted

## 2023-12-24 LAB — TSH+FREE T4
Free T4: 1.19 ng/dL (ref 0.82–1.77)
TSH: 2.89 u[IU]/mL (ref 0.450–4.500)

## 2023-12-24 LAB — VITAMIN D 25 HYDROXY (VIT D DEFICIENCY, FRACTURES): Vit D, 25-Hydroxy: 51.1 ng/mL (ref 30.0–100.0)

## 2023-12-24 LAB — TOXASSURE SELECT 13 (MW), URINE

## 2024-02-11 ENCOUNTER — Encounter (HOSPITAL_BASED_OUTPATIENT_CLINIC_OR_DEPARTMENT_OTHER): Payer: Self-pay

## 2024-02-11 ENCOUNTER — Other Ambulatory Visit: Payer: Self-pay

## 2024-02-11 ENCOUNTER — Emergency Department (HOSPITAL_BASED_OUTPATIENT_CLINIC_OR_DEPARTMENT_OTHER)
Admission: EM | Admit: 2024-02-11 | Discharge: 2024-02-11 | Discharge status: Against Medical Advice | Attending: Emergency Medicine | Admitting: Emergency Medicine

## 2024-02-11 ENCOUNTER — Emergency Department (HOSPITAL_BASED_OUTPATIENT_CLINIC_OR_DEPARTMENT_OTHER): Admitting: Radiology

## 2024-02-11 DIAGNOSIS — Z5321 Procedure and treatment not carried out due to patient leaving prior to being seen by health care provider: Secondary | ICD-10-CM | POA: Insufficient documentation

## 2024-02-11 DIAGNOSIS — S6991XA Unspecified injury of right wrist, hand and finger(s), initial encounter: Secondary | ICD-10-CM | POA: Diagnosis present

## 2024-02-11 DIAGNOSIS — S60940A Unspecified superficial injury of right index finger, initial encounter: Secondary | ICD-10-CM | POA: Diagnosis not present

## 2024-02-11 DIAGNOSIS — W231XXA Caught, crushed, jammed, or pinched between stationary objects, initial encounter: Secondary | ICD-10-CM | POA: Diagnosis not present

## 2024-02-11 NOTE — ED Triage Notes (Signed)
 Right index finger slammed in car door.   Nail bed bruised.

## 2024-02-12 ENCOUNTER — Other Ambulatory Visit: Payer: Self-pay | Admitting: Family Medicine

## 2024-02-12 DIAGNOSIS — F3177 Bipolar disorder, in partial remission, most recent episode mixed: Secondary | ICD-10-CM

## 2024-02-25 ENCOUNTER — Ambulatory Visit (INDEPENDENT_AMBULATORY_CARE_PROVIDER_SITE_OTHER): Admitting: Nurse Practitioner

## 2024-02-25 ENCOUNTER — Encounter: Payer: Self-pay | Admitting: Nurse Practitioner

## 2024-02-25 ENCOUNTER — Telehealth: Payer: Self-pay | Admitting: Family Medicine

## 2024-02-25 VITALS — BP 114/73 | HR 92 | Temp 98.4°F | Ht 71.0 in | Wt 271.8 lb

## 2024-02-25 DIAGNOSIS — R6889 Other general symptoms and signs: Secondary | ICD-10-CM

## 2024-02-25 DIAGNOSIS — Z1152 Encounter for screening for COVID-19: Secondary | ICD-10-CM | POA: Diagnosis not present

## 2024-02-25 DIAGNOSIS — U071 COVID-19: Secondary | ICD-10-CM

## 2024-02-25 DIAGNOSIS — R0981 Nasal congestion: Secondary | ICD-10-CM

## 2024-02-25 LAB — VERITOR FLU A/B WAIVED
Influenza A: NEGATIVE
Influenza B: NEGATIVE

## 2024-02-25 MED ORDER — AZELASTINE HCL 0.1 % NA SOLN: 1.0000 | Freq: Two times a day (BID) | NASAL | 0 refills | Status: AC

## 2024-02-25 MED ORDER — NIRMATRELVIR&RITONAVIR 300/100 20 X 150 MG & 10 X 100MG PO TBPK: 3.0000 | ORAL_TABLET | Freq: Two times a day (BID) | ORAL | 0 refills | Status: AC

## 2024-02-25 NOTE — Progress Notes (Addendum)
 Acute Office Visit  Subjective:     Patient ID: [name], male    DOB: [birth year], 38 y.o.   MRN: [id number]  Chief Complaint  Patient presents with   Cough    Cough, sore throat, headache, ears hurt, fever, chills, body aches. Started 3 days ago.    HPI [name] is a 38 y.o. male present for 01/15/2024 for an acute visit   who complains of congestion, myalgias, headache, and fevers up to 101 degrees for 3 days. " Had fever 101 yesterday took tylenol, HA, myalgia, nasal , ear fullness, and sore throat, cough yellow sputum, took Mucinex yesterday He denies a history of anorexia, chest pain, dizziness, nausea, sweats, weakness, and wheezing and denies a history of asthma. Patient denies smoke cigarettes.  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   Resolved Ambulatory Problems    Diagnosis Date Noted   UNSPECIFIED ANEMIA 09/11/2009   GERD 03/06/2009   Blood in stool 04/06/2009   NAUSEA AND VOMITING 03/06/2009   Diarrhea 03/06/2009   Lower abdominal pain 03/07/2009   FECAL OCCULT BLOOD 04/06/2009   Anal fissure 05/27/2011   Syncope and collapse 05/03/2012   Chest pain 05/03/2012   Class 3 severe obesity with serious comorbidity and body mass index (BMI) of 50.0 to 59.9 in adult (HCC) 05/03/2012   Abdominal mass of other site  06/15/2012   Abdominal cramps 06/15/2012   Wound drainage 09/04/2015   Gastric polyps    Hematochezia    Obesity, morbid, BMI 40.0-49.9 (HCC) 06/05/2021   Scrotal pain 04/26/2022   Past Medical History:  Diagnosis Date   Abscess    ADHD (attention deficit hyperactivity disorder)    Anxiety    Arthritis    Back pain    Bipolar disorder (HCC)    Chronic lower back pain    Constipation    Depression    Esophagitis    External hemorrhoids    Gastric ulcer    GERD (gastroesophageal reflux disease)    Hepatic steatosis    Hiatal hernia    History of stomach ulcers    Hx MRSA infection 06/2007   Hx of colonic polyps    Hypercholesteremia    Hypothyroidism    Internal hemorrhoids    Irritable bowel syndrome (IBS)    Obesity    Occult GI bleeding 12/2008   OSA on CPAP    Pain management    Pneumonia 09/01/2015   S/P colonoscopy 11/10, 10/08   Schizophrenia (HCC)    Sleep apnea    Stomach ulcer    Thyroid function test abnormal    Tobacco dipper    Wears contact lenses      Review of Systems  Constitutional:  Positive for fever. Negative for chills.       101 yesterday relieve with Tylenol  HENT:  Positive  for congestion, ear pain and sore throat. Negative for ear discharge.   Respiratory:  Positive for cough and sputum production. Negative for shortness of breath and wheezing.        Yellow sputum, took mucinex  Cardiovascular:  Negative for chest pain and leg swelling.  Gastrointestinal:  Negative for constipation, diarrhea, nausea and vomiting.  Musculoskeletal:  Positive for myalgias.  Skin:  Negative for itching and rash.  Neurological:  Positive for headaches. Negative for dizziness.   Negative unless indicated in HPI    Objective:    BP 114/73   Pulse 92   Temp 98.4 F (36.9 C)   Ht 5\' 11"  (1.803 m)   Wt 271 lb 12.8 oz (123.3 kg)   SpO2 100%   BMI 37.91 kg/m  BP Readings from Last 3 Encounters:  02/25/24 114/73  02/11/24 123/83  12/23/23 109/64    Wt Readings from Last 3 Encounters:  02/25/24 271 lb 12.8 oz (123.3 kg)  02/11/24 274 lb (124.3 kg)  12/23/23 274 lb (124.3 kg)      Physical Exam Vitals and nursing note reviewed.  Constitutional:      Appearance: He is obese.  HENT:     Head: Normocephalic.     Right Ear: Hearing, tympanic membrane, ear canal and external ear normal. No swelling.     Left Ear: Hearing, tympanic membrane, ear canal and external ear normal.     Nose: Congestion present.  Eyes:     General: No scleral icterus.    Extraocular Movements: Extraocular movements intact.     Conjunctiva/sclera: Conjunctivae normal.     Pupils: Pupils are equal, round, and reactive to light.  Cardiovascular:     Heart sounds: Normal heart sounds.  Pulmonary:     Effort: Pulmonary effort is normal.     Breath sounds: Normal breath sounds. No wheezing.  Musculoskeletal:        General: Normal range of motion.     Right lower leg: No edema.     Left lower leg: No edema.  Skin:    General: Skin is warm and dry.     Findings: No rash.  Neurological:     Mental Status: He is alert and oriented to person, place, and time.  Psychiatric:        Mood and Affect: Mood normal.        Behavior: Behavior normal.        Thought Content: Thought content normal.        Judgment: Judgment normal.   POC Flu A & B negative  COVID self administered home test positive Egfr 07/17/19 >60  No results found for any visits on 02/25/24.      Assessment & Plan:  Positive self-administered antigen test for COVID-19 -     Nirmatrelvir&Ritonavir 300/100; Take 3 tablets by mouth 2 (two) times daily for 5 days. Patient GFR is Egfr 07/17/19 >60.Take nirmatrelvir (150 mg) two tablets twice daily for 5 days and ritonavir (100 mg) one tablet twice daily for 5 days.  Dispense: 30 tablet; Refill: 0  Flu-like symptoms -     Veritor Flu A/B Waived  Encounter for screening for COVID-19  Nasal congestion -     Azelastine HCl; Place 1 spray  into both nostrils 2 (two) times daily. Use in each nostril as directed  Dispense: 30 mL; Refill: 0  [name] is a 38 year old Caucasian male seen today for viral URI, no acute distress POC flu A & B negative  COVID pending results; client is advised to go to [facility] get home test COVID and get tested and called clinic with the result viral upper respiratory illness [facility] called with a positive COVID result from a home COVID test, wants to get Paxlovid - his wife will come later to get his work note COVID +: Paxlovid 3 tabs BID for 5 days sent to his pharmacy Nasal congestion, Astelin 1 spray twice daily, okay to continue already prescribed Flonase Flonase Increase hydration -get over-the-counter Coricidin for cough  rest, apply heat to sinuses prn, and return office visit prn if symptoms persist or worsen. Lack of antibiotic effectiveness discussed with him. Call or return to clinic prn if these symptoms worsen or fail to improve as anticipated.  .  Return if symptoms worsen or fail to improve.  Arrie Aran Santa Lighter, [place] [facility] [address] [place], [place] [address] [phone]  Note: This document was prepared by Reubin Milan voice dictation technology and any errors that results from this process are unintentional.

## 2024-02-25 NOTE — Patient Instructions (Signed)

## 2024-02-25 NOTE — Telephone Encounter (Unsigned)
 Copied from CRM [id number]. Topic: General - Other >> Feb 25, 2024 11:04 AM Emylou G wrote: Reason for CRM: Patient seen this morning... was told to do home covid test and then Dr Dois Davenport ( dr of the day ) would send in medication.. he adv he is positive.. would like the medication.. [phone]

## 2024-02-25 NOTE — Addendum Note (Signed)
 Addended by: Martina Sinner on: 02/25/2024 12:35 PM   Modules accepted: Orders

## 2024-03-12 ENCOUNTER — Emergency Department (HOSPITAL_BASED_OUTPATIENT_CLINIC_OR_DEPARTMENT_OTHER)
Admission: EM | Admit: 2024-03-12 | Discharge: 2024-03-13 | Disposition: A | Discharge status: Home / Self Care | Attending: Emergency Medicine | Admitting: Emergency Medicine

## 2024-03-12 ENCOUNTER — Emergency Department (HOSPITAL_BASED_OUTPATIENT_CLINIC_OR_DEPARTMENT_OTHER): Admitting: Radiology

## 2024-03-12 ENCOUNTER — Encounter (HOSPITAL_BASED_OUTPATIENT_CLINIC_OR_DEPARTMENT_OTHER): Payer: Self-pay

## 2024-03-12 DIAGNOSIS — W228XXA Striking against or struck by other objects, initial encounter: Secondary | ICD-10-CM | POA: Diagnosis not present

## 2024-03-12 DIAGNOSIS — S6991XA Unspecified injury of right wrist, hand and finger(s), initial encounter: Secondary | ICD-10-CM | POA: Insufficient documentation

## 2024-03-12 DIAGNOSIS — M79644 Pain in right finger(s): Secondary | ICD-10-CM | POA: Diagnosis present

## 2024-03-12 DIAGNOSIS — Z87891 Personal history of nicotine dependence: Secondary | ICD-10-CM | POA: Insufficient documentation

## 2024-03-12 DIAGNOSIS — E039 Hypothyroidism, unspecified: Secondary | ICD-10-CM | POA: Insufficient documentation

## 2024-03-12 MED ORDER — OXYCODONE HCL 5 MG PO TABS
5.0000 mg | ORAL_TABLET | Freq: Once | ORAL | Status: AC
  Administered 2024-03-12: 5 mg via ORAL
  Filled 2024-03-12: qty 1
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
## 2024-03-13 NOTE — Discharge Instructions (Signed)
 You were evaluated in the Emergency Department and after careful evaluation, we did not find any emergent condition requiring admission or further testing in the hospital.  Your exam/testing today is overall reassuring.  X-ray did not show any broken bones.  Suspect your pain is related to significant bruising.  Recommend Tylenol  and Motrin  for discomfort, cold compresses for the next few days to help with swelling.  Please return to the Emergency Department if you experience any worsening of your condition.   Thank you for allowing us  to be a part of your care.

## 2024-03-13 NOTE — ED Provider Notes (Signed)
 [facility] Emergency Department Provider Note MRN:  [id number]  Arrival date & time: 03/13/24     Chief Complaint   Thumb injury History of Present Illness   [name] is a 38 y.o. year-old male with no pertinent past medical history presenting to the ED with chief complaint of thumb injury.  Patient was fixing his truck and when he lit the hood down the hood landed on his thumb causing immediate pain and swelling.  Here to see if it is broken.  Denies any other injuries.  Review of Systems  A thorough review of systems was obtained and all systems are negative except as noted in the HPI and PMH.   Patient's Health History    Past Medical History:  Diagnosis Date   Abscess    Right forearm healing   ADHD (attention deficit hyperactivity disorder)    Anal fissure    Anxiety    Arthritis    back, shoulders, ankles, knees   Back pain    Bipolar disorder (HCC)    Blood in stool 04/06/2009   Centricity Description: HEMATOCHEZIA Qualifier: Diagnosis of  By: Artelia Laroche  Centricity Description: MELENA Qualifier: Diagnosis of  By: Artelia Laroche    Chest pain    Chronic lower back pain    Class 3 severe obesity with serious comorbidity and body mass index (BMI) of 50.0 to 59.9 in adult (HCC) 05/03/2012   Constipation    Depression    Esophagitis    Distal esophageal erosions consistent with mild erosive  reflux esophagitis 12/2008 by EGD    External hemorrhoids    Gastric polyps    Gastric ulcer    GERD (gastroesophageal reflux disease)    Hepatic steatosis    Hiatal hernia    History of stomach ulcers    Hx MRSA infection 06/2007   right thigh   Hx of colonic polyps    Hypercholesteremia    denies   Hypothyroidism    Internal hemorrhoids    Irritable bowel syndrome (IBS)    Lower abdominal pain 03/07/2009   Qualifier: Diagnosis of  By: Kenney Peacemaker     Obesity    Occult GI bleeding 12/2008   Trivial upper GI  bleed/uncontrolled GERD by upper endoscopy 12/2008, normal f/u endoscopy 03/2009 with SBCE at that time   OSA on CPAP    Pain management    Pneumonia 09/01/2015   "on ATB still" (09/04/2015)   S/P colonoscopy 11/10, 10/08   Dr Davonna Estes   Schizophrenia [facility])    Scrotal pain 04/26/2022   Sleep apnea    Stomach ulcer    Syncope and collapse 05/03/2012   Thyroid  function test abnormal    Noted in 2011 discharge   Tobacco dipper    Wears contact lenses     Past Surgical History:  Procedure Laterality Date   ANKLE FRACTURE SURGERY Left ~ 2008   BACK SURGERY     BIOPSY  11/02/2019   Procedure: BIOPSY;  Surgeon: Annis Kinder, DO;  Location: [facility];  Service: Gastroenterology;;  EGD and Colon   BIOPSY  06/07/2020   Procedure: BIOPSY;  Surgeon: Annis Kinder, DO;  Location: [facility];  Service: Gastroenterology;;   Marianne Shirts PH STUDY N/A 06/07/2020   Procedure: BRAVO PH STUDY on PPI;  Surgeon: Annis Kinder, DO;  Location: [facility];  Service: Gastroenterology;  Laterality: N/A;   COLONOSCOPY  10/10/2009   anal papilla otherwise normal  COLONOSCOPY WITH PROPOFOL  N/A 11/02/2019   Procedure: COLONOSCOPY WITH PROPOFOL ;  Surgeon: Annis Kinder, DO;  Location: [facility];  Service: Gastroenterology;  Laterality: N/A;   ESOPHAGOGASTRODUODENOSCOPY  04/07/2009   Normal esophagus, small hiatal hernia   ESOPHAGOGASTRODUODENOSCOPY  01/09/2009   Distal esophageal erosions consistent with mild erosive reflux esophagitis, otherwise normal esophagus, small hiatal herniaotherwise normal stomach, D1-D2    ESOPHAGOGASTRODUODENOSCOPY  08/26/2007   Normal esophagus, a small hiatal/hernia, otherwise normal stomach D1 through D3   ESOPHAGOGASTRODUODENOSCOPY (EGD) WITH PROPOFOL  N/A 11/02/2019   Procedure: ESOPHAGOGASTRODUODENOSCOPY (EGD) WITH PROPOFOL ;  Surgeon: Annis Kinder, DO;  [facility];  Service: Gastroenterology;  Laterality: N/A;    ESOPHAGOGASTRODUODENOSCOPY (EGD) WITH PROPOFOL  N/A 06/07/2020   Procedure: ESOPHAGOGASTRODUODENOSCOPY (EGD) WITH PROPOFOL ;  Surgeon: Annis Kinder, DO;  [facility];  Service: Gastroenterology;  Laterality: N/A;   FRACTURE SURGERY     GASTRIC BYPASS     GASTRIC ROUX-EN-Y N/A 06/05/2021   Procedure: LAPAROSCOPIC ROUX-EN-Y GASTRIC BYPASS WITH UPPER ENDOSCOPY;  Surgeon: Adalberto Acton, MD;  Location: [facility];  Service: General;  Laterality: N/A;   ileocolonoscopy  08/26/2007    A normal rectum, colon, and terminal ileum   INCISION AND DRAINAGE  09/04/2015   "reopened my back incsion"   LUMBAR LAMINECTOMY/DECOMPRESSION MICRODISCECTOMY Left 09/23/2017   Procedure: Microdiscectomy - left - Lumbar four-Lumbar five;  Surgeon: Agustina Aldrich, MD;  Location: [facility] OR;  Service: Neurosurgery;  Laterality: Left;   LUMBAR MICRODISCECTOMY  08/21/2015   LUMBAR MICRODISCECTOMY Left 09/23/2017   L4-5   LUMBAR WOUND DEBRIDEMENT N/A 09/04/2015   Procedure: LUMBAR WOUND DEBRIDEMENT;  Surgeon: Augusto Blonder, MD;  Location: [facility];  Service: Neurosurgery;  Laterality: N/A;   right side sugery     enlarged lymph node gland removed under arm pit age 43-5 years   SHOULDER ARTHROSCOPY WITH DISTAL CLAVICLE RESECTION Right 11/22/2021   Procedure: SHOULDER ARTHROSCOPY WITH SUBACROMIAL DECOMPRESSION AND DISTAL CLAVICLE EXCISION;  Surgeon: Tonita Frater, MD;  Location: [facility];  Service: Orthopedics;  Laterality: Right;   Small bowel capsule  04/11/2009    normal throughout   VASECTOMY      Family History  Problem Relation Age of Onset   Leukemia Father 33   Colon polyps Father    Clotting disorder Father    Heart disease Father    Cancer Father    Depression Father    Sleep apnea Father    Obesity Father    Seizures Mother    Irritable bowel syndrome Mother    Thyroid  disease Mother    Depression Mother    Anxiety disorder Mother    Bipolar disorder Brother    ADD / ADHD Brother     Diabetes Paternal Grandmother    Ulcerative colitis Paternal Aunt    Colon cancer Neg Hx    Liver cancer Neg Hx    Esophageal cancer Neg Hx    Stomach cancer Neg Hx    Pancreatic cancer Neg Hx     Social History   Socioeconomic History   Marital status: Married    Spouse name: Tabitha   Number of children: 2   Years of education: Not on file   Highest education level: 12th grade  Occupational History   Occupation: Designer, television/film set    Employer: [employer]   Occupation: truck driver  Tobacco Use   Smoking status: Never   Smokeless tobacco: Former    Types: Snuff    Quit date: 12/16/2020  Tobacco comments:    quitting snuff. whinning off   Vaping Use   Vaping status: Never Used  Substance and Sexual Activity   Alcohol  use: Not Currently   Drug use: No   Sexual activity: Not Currently  Other Topics Concern   Not on file  Social History Narrative   Not on file   Social Drivers of Health   Financial Resource Strain: Low Risk  (12/23/2023)   Overall Financial Resource Strain (CARDIA)    Difficulty of Paying Living Expenses: Not hard at all  Food Insecurity: No Food Insecurity (12/23/2023)   Hunger Vital Sign    Worried About Running Out of Food in the Last Year: Never true    Ran Out of Food in the Last Year: Never true  Transportation Needs: No Transportation Needs (12/23/2023)   PRAPARE - Administrator, Civil Service (Medical): No    Lack of Transportation (Non-Medical): No  Physical Activity: Sufficiently Active (12/23/2023)   Exercise Vital Sign    Days of Exercise per Week: 6 days    Minutes of Exercise per Session: 150+ min  Stress: No Stress Concern Present (12/23/2023)   [employer] of Occupational Health - Occupational Stress Questionnaire    Feeling of Stress : Not at all  Social Connections: Moderately Integrated (12/23/2023)   Social Connection and Isolation Panel [NHANES]    Frequency of Communication with Friends and Family: More than  three times a week    Frequency of Social Gatherings with Friends and Family: More than three times a week    Attends Religious Services: More than 4 times per year    Active Member of [employer] or Organizations: No    Attends Engineer, structural: Not on file    Marital Status: Married  Catering manager Violence: Not on file     Physical Exam   Vitals:   03/12/24 2153  BP: 128/80  Pulse: 92  Resp: 18  Temp: 97.9 F (36.6 C)  SpO2: 98%    CONSTITUTIONAL: Well-appearing, NAD NEURO/PSYCH:  Alert and oriented x 3, no focal deficits EYES:  eyes equal and reactive ENT/NECK:  no LAD, no JVD CARDIO: Regular rate, well-perfused, normal S1 and S2 PULM:  CTAB no wheezing or rhonchi GI/GU:  non-distended, non-tender MSK/SPINE:  No gross deformities, no edema SKIN: Right thumb is swollen and tender, neurovascularly intact   *Additional and/or pertinent findings included in MDM below  Diagnostic and Interventional Summary    EKG Interpretation Date/Time:    Ventricular Rate:    PR Interval:    QRS Duration:    QT Interval:    QTC Calculation:   R Axis:      Text Interpretation:         Labs Reviewed - No data to display  DG Finger Thumb Left  Final Result      Medications  oxyCODONE  (Oxy IR/ROXICODONE ) immediate release tablet 5 mg (5 mg Oral Given 03/12/24 2318)     Procedures  /  Critical Care Procedures  ED Course and Medical Decision Making  Initial Impression and Ddx Fracture versus dislocation versus bruising of the soft tissues.  Neurovascularly intact, no other injuries.  Past medical/surgical history that increases complexity of ED encounter: None  Interpretation of Diagnostics I personally reviewed the hand x-ray and my interpretation is as follows: No fracture    Patient Reassessment and Ultimate Disposition/Management     Discharge  Patient management required discussion with the following services or consulting  groups:  None  Complexity of Problems Addressed Acute complicated illness or Injury  Additional Data Reviewed and Analyzed Further history obtained from: Further history from spouse/family member  Additional Factors Impacting ED Encounter Risk None  Merrick Abe. Harless Lien, MD [facility] Emergency Medicine [facility] [email or web address]  Final Clinical Impressions(s) / ED Diagnoses     ICD-10-CM   1. Injury of right thumb, initial encounter  [id number]       ED Discharge Orders     None        Discharge Instructions Discussed with and Provided to Patient:    Discharge Instructions      You were evaluated in the Emergency Department and after careful evaluation, we did not find any emergent condition requiring admission or further testing in the hospital.  Your exam/testing today is overall reassuring.  X-ray did not show any broken bones.  Suspect your pain is related to significant bruising.  Recommend Tylenol  and Motrin  for discomfort, cold compresses for the next few days to help with swelling.  Please return to the Emergency Department if you experience any worsening of your condition.   Thank you for allowing us  to be a part of your care.      Edson Graces, MD 03/13/24 Merrianne Abbot

## 2024-03-24 ENCOUNTER — Encounter: Payer: Self-pay | Admitting: Family Medicine

## 2024-03-24 ENCOUNTER — Telehealth: Payer: 59 | Admitting: Family Medicine

## 2024-03-24 DIAGNOSIS — F902 Attention-deficit hyperactivity disorder, combined type: Secondary | ICD-10-CM

## 2024-03-24 DIAGNOSIS — H6991 Unspecified Eustachian tube disorder, right ear: Secondary | ICD-10-CM | POA: Diagnosis not present

## 2024-03-24 MED ORDER — AMPHETAMINE-DEXTROAMPHETAMINE 20 MG PO TABS: 20.0000 mg | ORAL_TABLET | Freq: Every day | ORAL | 0 refills | Status: DC

## 2024-03-24 MED ORDER — AMPHETAMINE-DEXTROAMPHETAMINE 20 MG PO TABS
20.0000 mg | ORAL_TABLET | Freq: Every day | ORAL | 0 refills | Status: DC
Start: 2024-06-08 — End: 2024-06-23

## 2024-03-24 MED ORDER — AMPHETAMINE-DEXTROAMPHETAMINE 20 MG PO TABS
20.0000 mg | ORAL_TABLET | Freq: Every day | ORAL | 0 refills | Status: DC
Start: 2024-04-09 — End: 2024-06-23

## 2024-03-24 NOTE — Progress Notes (Signed)
 MyChart Video visit  Subjective: CC: ADHD PCP: Eliodoro Guerin, DO WGN:[id number] [name] is a 38 y.o. male. Patient provides verbal consent for consult held via video.  Due to COVID-19 pandemic this visit was conducted virtually. This visit type was conducted due to national recommendations for restrictions regarding the COVID-19 Pandemic (e.g. social distancing, sheltering in place) in an effort to limit this patient's exposure and mitigate transmission in our community. All issues noted in this document were discussed and addressed.  A physical exam was not performed with this format.   Location of patient: home Location of provider: [facility] Others present for call: none  1.  ADHD He reports stability in symptoms.  Denies any increased anxiety, insomnia, dryness or constipation with medicine.  2.  Eustachian tube dysfunction Has persistent fullness and irritation in the right eardrum despite use of Clarinex .  He does report that symptomatically he is doing better from an allergy standpoint but this still remains.  He would like to go ahead and see ENT to discuss next steps  ROS: Per HPI  Allergies  Allergen Reactions   Ibuprofen  Other (See Comments)    Makes ulcers bleed   Influenza Vaccines Shortness Of Breath and Other (See Comments)    Rash and unable to breathe well   Tylenol  [Acetaminophen ] Hives   Bee Venom Swelling    SWELLING REACTION UNSPECIFIED    Tramadol  Itching   Past Medical History:  Diagnosis Date   Abscess    Right forearm healing   ADHD (attention deficit hyperactivity disorder)    Anal fissure    Anxiety    Arthritis    back, shoulders, ankles, knees   Back pain    Bipolar disorder (HCC)    Blood in stool 04/06/2009   Centricity Description: HEMATOCHEZIA Qualifier: Diagnosis of  By: Artelia Laroche  Centricity Description: MELENA Qualifier: Diagnosis of  By: Artelia Laroche    Chest pain    Chronic lower back pain    Class 3 severe  obesity with serious comorbidity and body mass index (BMI) of 50.0 to 59.9 in adult (HCC) 05/03/2012   Constipation    Depression    Esophagitis    Distal esophageal erosions consistent with mild erosive  reflux esophagitis 12/2008 by EGD    External hemorrhoids    Gastric polyps    Gastric ulcer    GERD (gastroesophageal reflux disease)    Hepatic steatosis    Hiatal hernia    History of stomach ulcers    Hx MRSA infection 06/2007   right thigh   Hx of colonic polyps    Hypercholesteremia    denies   Hypothyroidism    Internal hemorrhoids    Irritable bowel syndrome (IBS)    Lower abdominal pain 03/07/2009   Qualifier: Diagnosis of  By: Kenney Peacemaker     Obesity    Occult GI bleeding 12/2008   Trivial upper GI bleed/uncontrolled GERD by upper endoscopy 12/2008, normal f/u endoscopy 03/2009 with SBCE at that time   OSA on CPAP    Pain management    Pneumonia 09/01/2015   "on ATB still" (09/04/2015)   S/P colonoscopy 11/10, 10/08   Dr Davonna Estes   Schizophrenia [facility])    Scrotal pain 04/26/2022   Sleep apnea    Stomach ulcer    Syncope and collapse 05/03/2012   Thyroid  function test abnormal    Noted in 2011 discharge   Tobacco dipper    Wears contact lenses  Current Outpatient Medications:    amphetamine -dextroamphetamine  (ADDERALL ) 20 MG tablet, Take 1 tablet (20 mg total) by mouth daily., Disp: 30 tablet, Rfl: 0   amphetamine -dextroamphetamine  (ADDERALL ) 20 MG tablet, Take 1 tablet (20 mg total) by mouth daily., Disp: 30 tablet, Rfl: 0   amphetamine -dextroamphetamine  (ADDERALL ) 20 MG tablet, Take 1 tablet (20 mg total) by mouth daily., Disp: 30 tablet, Rfl: 0   azelastine  (ASTELIN ) 0.1 % nasal spray, Place 1 spray into both nostrils 2 (two) times daily. Use in each nostril as directed, Disp: 30 mL, Rfl: 0   Calcium  Carbonate (CALCIUM  600 PO), Take 600 mg by mouth in the morning, at noon, and at bedtime., Disp: , Rfl:    cyclobenzaprine  (FLEXERIL ) 10 MG tablet, Take  1 tablet (10 mg total) by mouth 3 (three) times daily as needed. for muscle spams, Disp: 90 tablet, Rfl: 6   desloratadine  (CLARINEX ) 5 MG tablet, Take 1 tablet (5 mg total) by mouth daily. For allergies, Disp: 90 tablet, Rfl: 3   docusate sodium  (COLACE) 250 MG capsule, Take 250 mg by mouth daily., Disp: , Rfl:    EPINEPHrine  0.3 mg/0.3 mL IJ SOAJ injection, Inject 0.3 mg into the muscle as needed for anaphylaxis., Disp: 1 each, Rfl: 0   FLUoxetine  (PROZAC ) 10 MG capsule, Take 1 capsule (10 mg total) by mouth daily., Disp: 90 capsule, Rfl: 3   fluticasone  (FLONASE ) 50 MCG/ACT nasal spray, Place 2 sprays into both nostrils daily., Disp: 16 g, Rfl: 6   gabapentin  (NEURONTIN ) 300 MG capsule, Take 1 capsule (300 mg total) by mouth 3 (three) times daily., Disp: 270 capsule, Rfl: 3   ketoconazole  (NIZORAL ) 2 % cream, Apply 1 Application topically daily. X10-14 days per flare up of fungal rash, Disp: 60 g, Rfl: 1   lamoTRIgine  (LAMICTAL ) 100 MG tablet, Take 1 tablet (100 mg total) by mouth 2 (two) times daily. *note dose change, Disp: 180 tablet, Rfl: 1   levothyroxine  (SYNTHROID ) 75 MCG tablet, Take 1 tablet (75 mcg total) by mouth daily before breakfast., Disp: 90 tablet, Rfl: 3   lidocaine  (LIDODERM ) 5 %, Place 1 patch onto the skin daily. Remove & Discard patch within 12 hours or as directed by MD, Disp: 30 patch, Rfl: 0   Multiple Vitamins-Minerals (BARIATRIC MULTIVITAMINS/IRON PO), Take 1 tablet by mouth in the morning and at bedtime., Disp: , Rfl:    ondansetron  (ZOFRAN -ODT) 4 MG disintegrating tablet, Take 1 tablet (4 mg total) by mouth every 8 (eight) hours as needed for nausea or vomiting., Disp: 20 tablet, Rfl: 0   pantoprazole  (PROTONIX ) 40 MG tablet, TAKE 1 TABLET BY MOUTH ONCE DAILY AS NEEDED FOR  HEARTBURN  OR  INDIGESTION, Disp: 90 tablet, Rfl: 0   Rimegepant Sulfate (NURTEC) 75 MG TBDP, Take 1 tablet by mouth daily as needed (migraine)., Disp: 4 tablet, Rfl: 0   sucralfate  (CARAFATE ) 1 g  tablet, Take 1 tablet (1 g total) by mouth 4 (four) times daily -  with meals and at bedtime., Disp: 90 tablet, Rfl: 0   Vitamin D , Ergocalciferol , (DRISDOL ) 1.25 MG (50000 UNIT) CAPS capsule, TAKE 1 CAPSULE BY MOUTH EVERY THURSDAY, Disp: 12 capsule, Rfl: 1  Gen: Well-appearing male in no acute distress Psych: Mood stable, speech normal, affect appropriate.  Good eye contact.  Thought process linear  Assessment/ Plan: 38 y.o. male   Eustachian tube dysfunction, right - Plan: Ambulatory referral to ENT  Attention deficit hyperactivity disorder (ADHD), combined type - Plan: amphetamine -dextroamphetamine  (ADDERALL ) 20 MG tablet,  amphetamine -dextroamphetamine  (ADDERALL ) 20 MG tablet, amphetamine -dextroamphetamine  (ADDERALL ) 20 MG tablet  Referral to ENT as symptoms are refractory to antihistamines, nasal sprays etc.  Adderall  renewed.  ADHD is chronic and stable.  He is up-to-date on UDS and CSA and the national narcotic database was reviewed with no red flags  Start time: 3:25pm End time: 3:32pm  Total time spent on patient care (including video visit/ documentation): 6  minutes  Yessika Otte Bambi Bonine, DO [facility] [phone]

## 2024-04-04 ENCOUNTER — Other Ambulatory Visit: Payer: Self-pay | Admitting: Family Medicine

## 2024-04-04 DIAGNOSIS — F3177 Bipolar disorder, in partial remission, most recent episode mixed: Secondary | ICD-10-CM

## 2024-04-09 ENCOUNTER — Ambulatory Visit: Payer: Self-pay

## 2024-04-09 NOTE — Telephone Encounter (Signed)
 Chief Complaint: cough Symptoms: cough, runny nose, headache, chills, fever, CP w/ coughing Frequency: 2 days Pertinent Negatives: Patient denies SOB, palpitations Disposition: [] ED /[x] Urgent Care (no appt availability in office) / [] Appointment(In office/virtual)/ []  [facility]/ [] Home Care/ [] Refused Recommended Disposition /[] [facility]/ []  Follow-up with PCP Additional Notes: Pt reports flu-like symptoms for 2 days. Pt reports productive cough with green sputum, 7/10 headache, chills, 101F fever, runny nose, and CP with coughing. Denies CP without coughing or any SOB. Pt reports his son was recently sick with a viral infection. Pt states the cough is severe and kept him up. RN advised pt he should be seen within 24 hrs and advised UC since there is no availability in the office. Pt agreeable. RN advised pt if he develops SOB or CP without coughing he should go to the ED instead. Pt verbalized understanding.    Copied from CRM [id number]. Topic: Clinical - Red Word Triage >> Apr 09, 2024 12:39 PM Rachelle R wrote: [facility] that prompted transfer to Nurse Triage: Patient states he is having flu like symptoms. Cough, Stuffy Nose, Painful headache - feels like its going to explode, chills, and sweats. Reason for Disposition  SEVERE coughing spells (e.g., whooping sound after coughing, vomiting after coughing)  Answer Assessment - Initial Assessment Questions 1. ONSET: "When did the cough begin?"      2 days ago  2. SEVERITY: "How bad is the cough today?"      Kept him up last night; rough since 1130 today 3. SPUTUM: "Describe the color of your sputum" (none, dry cough; clear, white, yellow, green)     Green/yellow 4. HEMOPTYSIS: "Are you coughing up any blood?" If so ask: "How much?" (flecks, streaks, tablespoons, etc.)     No  5. DIFFICULTY BREATHING: "Are you having difficulty breathing?" If Yes, ask: "How bad is it?" (e.g., mild, moderate, severe)    - MILD: No SOB  at rest, mild SOB with walking, speaks normally in sentences, can lie down, no retractions, pulse < 100.    - MODERATE: SOB at rest, SOB with minimal exertion and prefers to sit, cannot lie down flat, speaks in phrases, mild retractions, audible wheezing, pulse 100-120.    - SEVERE: Very SOB at rest, speaks in single words, struggling to breathe, sitting hunched forward, retractions, pulse > 120      None  6. FEVER: "Do you have a fever?" If Yes, ask: "What is your temperature, how was it measured, and when did it start?"     101F 7. CARDIAC HISTORY: "Do you have any history of heart disease?" (e.g., heart attack, congestive heart failure)      No  8. LUNG HISTORY: "Do you have any history of lung disease?"  (e.g., pulmonary embolus, asthma, emphysema)     Sleep apnea, resolved  9. PE RISK FACTORS: "Do you have a history of blood clots?" (or: recent major surgery, recent prolonged travel, bedridden)     No  10. OTHER SYMPTOMS: "Do you have any other symptoms?" (e.g., runny nose, wheezing, chest pain) 101F today. Son was sick. Headache "like it's going to explode", 7/10 and "throbs." Does not normally have headaches. Endorses runny nose. Has taken tylenol  and ibuprofen , no relief. Some CP only with coughing. "Feels like someone is hitting him in the chest"  Protocols used: Cough - Acute Productive-A-AH

## 2024-04-09 NOTE — Telephone Encounter (Signed)
 noted

## 2024-05-05 ENCOUNTER — Encounter (INDEPENDENT_AMBULATORY_CARE_PROVIDER_SITE_OTHER): Payer: Self-pay | Admitting: Physician Assistant

## 2024-05-05 ENCOUNTER — Ambulatory Visit (INDEPENDENT_AMBULATORY_CARE_PROVIDER_SITE_OTHER): Admitting: Audiology

## 2024-05-05 ENCOUNTER — Institutional Professional Consult (permissible substitution) (INDEPENDENT_AMBULATORY_CARE_PROVIDER_SITE_OTHER): Admitting: Physician Assistant

## 2024-05-05 ENCOUNTER — Ambulatory Visit (INDEPENDENT_AMBULATORY_CARE_PROVIDER_SITE_OTHER): Admitting: Physician Assistant

## 2024-05-05 VITALS — BP 125/70 | HR 101 | Ht 71.0 in | Wt 265.0 lb

## 2024-05-05 DIAGNOSIS — H6990 Unspecified Eustachian tube disorder, unspecified ear: Secondary | ICD-10-CM | POA: Diagnosis not present

## 2024-05-05 DIAGNOSIS — H6591 Unspecified nonsuppurative otitis media, right ear: Secondary | ICD-10-CM | POA: Diagnosis not present

## 2024-05-05 DIAGNOSIS — Z0110 Encounter for examination of ears and hearing without abnormal findings: Secondary | ICD-10-CM

## 2024-05-05 DIAGNOSIS — H6993 Unspecified Eustachian tube disorder, bilateral: Secondary | ICD-10-CM

## 2024-05-05 DIAGNOSIS — H93293 Other abnormal auditory perceptions, bilateral: Secondary | ICD-10-CM

## 2024-05-05 NOTE — Progress Notes (Deleted)
 Dear Dr. Bonnell Butcher, Here is my assessment for our mutual patient, [name]. Thank you for allowing me the opportunity to care for your patient. Please do not hesitate to contact me should you have any other questions. Sincerely, Belma Boxer PA-C  Otolaryngology Clinic Note Referring provider: Dr. Bonnell Butcher HPI:  [name] is a 38 y.o. male kindly referred by Dr. Bonnell Butcher   The patient ***   Told he has fuid behind right reat. 1.5 months  Feels pressure, sees it and looks like fluid.   Claniex, no nsala spray-   No hearing loss. No rining, occasionalpain.   Hearing feels muffled today  Feels like it needs to pop,  Does hear some cracking in ear   Left ear will happen every now and again, it will pop   Seasonal allergies, clarinex  now daily  No ear surgeur, no truama, no ear infections as child.           Independent Review of Additional Tests or Records:  ***   PMH/Meds/All/SocHx/FamHx/ROS:   Past Medical History:  Diagnosis Date   Abscess    Right forearm healing   ADHD (attention deficit hyperactivity disorder)    Anal fissure    Anxiety    Arthritis    back, shoulders, ankles, knees   Back pain    Bipolar disorder (HCC)    Blood in stool 04/06/2009   Centricity Description: HEMATOCHEZIA Qualifier: Diagnosis of  By: Artelia Laroche  Centricity Description: MELENA Qualifier: Diagnosis of  By: Artelia Laroche    Chest pain    Chronic lower back pain    Class 3 severe obesity with serious comorbidity and body mass index (BMI) of 50.0 to 59.9 in adult 05/03/2012   Constipation    Depression    Esophagitis    Distal esophageal erosions consistent with mild erosive  reflux esophagitis 12/2008 by EGD    External hemorrhoids    Gastric polyps    Gastric ulcer    GERD (gastroesophageal reflux disease)    Hepatic steatosis    Hiatal hernia    History of stomach ulcers    Hx MRSA infection 06/2007   right thigh   Hx of  colonic polyps    Hypercholesteremia    denies   Hypothyroidism    Internal hemorrhoids    Irritable bowel syndrome (IBS)    Lower abdominal pain 03/07/2009   Qualifier: Diagnosis of  By: Kenney Peacemaker     Obesity    Occult GI bleeding 12/2008   Trivial upper GI bleed/uncontrolled GERD by upper endoscopy 12/2008, normal f/u endoscopy 03/2009 with SBCE at that time   OSA on CPAP    Pain management    Pneumonia 09/01/2015   on ATB still (09/04/2015)   S/P colonoscopy 11/10, 10/08   Dr Davonna Estes   Schizophrenia [facility])    Scrotal pain 04/26/2022   Sleep apnea    Stomach ulcer    Syncope and collapse 05/03/2012   Thyroid  function test abnormal    Noted in 2011 discharge   Tobacco dipper    Wears contact lenses      Past Surgical History:  Procedure Laterality Date   ANKLE FRACTURE SURGERY Left ~ 2008   BACK SURGERY     BIOPSY  11/02/2019   Procedure: BIOPSY;  Surgeon: Annis Kinder, DO;  Location: [facility];  Service: Gastroenterology;;  EGD and Colon   BIOPSY  06/07/2020   Procedure: BIOPSY;  Surgeon: Annis Kinder, DO;  [facility]: [facility];  Service: Gastroenterology;;   BRAVO PH STUDY N/A 06/07/2020   Procedure: BRAVO PH STUDY on PPI;  Surgeon: Annis Kinder, DO;  [facility]: [facility];  Service: Gastroenterology;  Laterality: N/A;   COLONOSCOPY  10/10/2009   anal papilla otherwise normal   COLONOSCOPY WITH PROPOFOL  N/A 11/02/2019   Procedure: COLONOSCOPY WITH PROPOFOL ;  Surgeon: Annis Kinder, DO;  [facility];  Service: Gastroenterology;  Laterality: N/A;   ESOPHAGOGASTRODUODENOSCOPY  04/07/2009   Normal esophagus, small hiatal hernia   ESOPHAGOGASTRODUODENOSCOPY  01/09/2009   Distal esophageal erosions consistent with mild erosive reflux esophagitis, otherwise normal esophagus, small hiatal herniaotherwise normal stomach, D1-D2    ESOPHAGOGASTRODUODENOSCOPY  08/26/2007   Normal esophagus, a small hiatal/hernia, otherwise normal  stomach D1 through D3   ESOPHAGOGASTRODUODENOSCOPY (EGD) WITH PROPOFOL  N/A 11/02/2019   Procedure: ESOPHAGOGASTRODUODENOSCOPY (EGD) WITH PROPOFOL ;  Surgeon: Annis Kinder, DO;  [facility];  Service: Gastroenterology;  Laterality: N/A;   ESOPHAGOGASTRODUODENOSCOPY (EGD) WITH PROPOFOL  N/A 06/07/2020   Procedure: ESOPHAGOGASTRODUODENOSCOPY (EGD) WITH PROPOFOL ;  Surgeon: Annis Kinder, DO;  [facility];  Service: Gastroenterology;  Laterality: N/A;   FRACTURE SURGERY     GASTRIC BYPASS     GASTRIC ROUX-EN-Y N/A 06/05/2021   Procedure: LAPAROSCOPIC ROUX-EN-Y GASTRIC BYPASS WITH UPPER ENDOSCOPY;  Surgeon: Adalberto Acton, MD;  [facility]: [facility];  Service: General;  Laterality: N/A;   ileocolonoscopy  08/26/2007    A normal rectum, colon, and terminal ileum   INCISION AND DRAINAGE  09/04/2015   reopened my back incsion   LUMBAR LAMINECTOMY/DECOMPRESSION MICRODISCECTOMY Left 09/23/2017   Procedure: Microdiscectomy - left - Lumbar four-Lumbar five;  Surgeon: Agustina Aldrich, MD;  [facility];  Service: Neurosurgery;  Laterality: Left;   LUMBAR MICRODISCECTOMY  08/21/2015   LUMBAR MICRODISCECTOMY Left 09/23/2017   L4-5   LUMBAR WOUND DEBRIDEMENT N/A 09/04/2015   Procedure: LUMBAR WOUND DEBRIDEMENT;  Surgeon: Augusto Blonder, MD;  [facility]: [facility];  Service: Neurosurgery;  Laterality: N/A;   right side sugery     enlarged lymph node gland removed under arm pit age 90-5 years   SHOULDER ARTHROSCOPY WITH DISTAL CLAVICLE RESECTION Right 11/22/2021   Procedure: SHOULDER ARTHROSCOPY WITH SUBACROMIAL DECOMPRESSION AND DISTAL CLAVICLE EXCISION;  Surgeon: Tonita Frater, MD;  Location: [facility];  Service: Orthopedics;  Laterality: Right;   Small bowel capsule  04/11/2009    normal throughout   VASECTOMY      Family History  Problem Relation Age of Onset   Leukemia Father 45   Colon polyps Father    Clotting disorder Father    Heart disease Father    Cancer  Father    Depression Father    Sleep apnea Father    Obesity Father    Seizures Mother    Irritable bowel syndrome Mother    Thyroid  disease Mother    Depression Mother    Anxiety disorder Mother    Bipolar disorder Brother    ADD / ADHD Brother    Diabetes Paternal Grandmother    Ulcerative colitis Paternal Aunt    Colon cancer Neg Hx    Liver cancer Neg Hx    Esophageal cancer Neg Hx    Stomach cancer Neg Hx    Pancreatic cancer Neg Hx      Social Connections: Moderately Integrated (12/23/2023)   Social Connection and Isolation Panel [NHANES]    Frequency of Communication with Friends and Family: More than three times a week  Frequency of Social Gatherings with Friends and Family: More than three times a week    Attends Religious Services: More than 4 times per year    Active Member of Clubs or Organizations: No    Attends Engineer, structural: Not on file    Marital Status: Married      Current Outpatient Medications:    amphetamine -dextroamphetamine  (ADDERALL ) 20 MG tablet, Take 1 tablet (20 mg total) by mouth daily., Disp: 30 tablet, Rfl: 0   [START ON 05/09/2024] amphetamine -dextroamphetamine  (ADDERALL ) 20 MG tablet, Take 1 tablet (20 mg total) by mouth daily., Disp: 30 tablet, Rfl: 0   [START ON 06/08/2024] amphetamine -dextroamphetamine  (ADDERALL ) 20 MG tablet, Take 1 tablet (20 mg total) by mouth daily., Disp: 30 tablet, Rfl: 0   azelastine  (ASTELIN ) 0.1 % nasal spray, Place 1 spray into both nostrils 2 (two) times daily. Use in each nostril as directed, Disp: 30 mL, Rfl: 0   Calcium  Carbonate (CALCIUM  600 PO), Take 600 mg by mouth in the morning, at noon, and at bedtime., Disp: , Rfl:    cyclobenzaprine  (FLEXERIL ) 10 MG tablet, Take 1 tablet (10 mg total) by mouth 3 (three) times daily as needed. for muscle spams, Disp: 90 tablet, Rfl: 6   desloratadine  (CLARINEX ) 5 MG tablet, Take 1 tablet (5 mg total) by mouth daily. For allergies, Disp: 90 tablet, Rfl: 3    docusate sodium  (COLACE) 250 MG capsule, Take 250 mg by mouth daily., Disp: , Rfl:    EPINEPHrine  0.3 mg/0.3 mL IJ SOAJ injection, Inject 0.3 mg into the muscle as needed for anaphylaxis., Disp: 1 each, Rfl: 0   FLUoxetine  (PROZAC ) 10 MG capsule, Take 1 capsule (10 mg total) by mouth daily., Disp: 90 capsule, Rfl: 3   fluticasone  (FLONASE ) 50 MCG/ACT nasal spray, Place 2 sprays into both nostrils daily., Disp: 16 g, Rfl: 6   gabapentin  (NEURONTIN ) 300 MG capsule, Take 1 capsule (300 mg total) by mouth 3 (three) times daily., Disp: 270 capsule, Rfl: 3   ketoconazole  (NIZORAL ) 2 % cream, Apply 1 Application topically daily. X10-14 days per flare up of fungal rash, Disp: 60 g, Rfl: 1   lamoTRIgine  (LAMICTAL ) 100 MG tablet, Take 1 tablet (100 mg total) by mouth 2 (two) times daily. *note dose change, Disp: 180 tablet, Rfl: 1   levothyroxine  (SYNTHROID ) 75 MCG tablet, Take 1 tablet (75 mcg total) by mouth daily before breakfast., Disp: 90 tablet, Rfl: 3   lidocaine  (LIDODERM ) 5 %, Place 1 patch onto the skin daily. Remove & Discard patch within 12 hours or as directed by MD, Disp: 30 patch, Rfl: 0   Multiple Vitamins-Minerals (BARIATRIC MULTIVITAMINS/IRON PO), Take 1 tablet by mouth in the morning and at bedtime., Disp: , Rfl:    ondansetron  (ZOFRAN -ODT) 4 MG disintegrating tablet, Take 1 tablet (4 mg total) by mouth every 8 (eight) hours as needed for nausea or vomiting., Disp: 20 tablet, Rfl: 0   pantoprazole  (PROTONIX ) 40 MG tablet, TAKE 1 TABLET BY MOUTH ONCE DAILY AS NEEDED FOR  HEARTBURN  OR  INDIGESTION, Disp: 90 tablet, Rfl: 0   Rimegepant Sulfate (NURTEC) 75 MG TBDP, Take 1 tablet by mouth daily as needed (migraine)., Disp: 4 tablet, Rfl: 0   sucralfate  (CARAFATE ) 1 g tablet, Take 1 tablet (1 g total) by mouth 4 (four) times daily -  with meals and at bedtime., Disp: 90 tablet, Rfl: 0   Vitamin D , Ergocalciferol , (DRISDOL ) 1.25 MG (50000 UNIT) CAPS capsule, TAKE 1 CAPSULE BY MOUTH  EVERY THURSDAY,  Disp: 12 capsule, Rfl: 1   Physical Exam:   There were no vitals taken for this visit.  Pertinent Findings  CN II-XII intact ***Bilateral EAC clear and TM intact with well pneumatized middle ear spaces Weber 512: equal Rinne 512: AC > BC b/l  Anterior rhinoscopy: Septum ***; bilateral inferior turbinates with *** No lesions of oral cavity/oropharynx; dentition *** No obviously palpable neck masses/lymphadenopathy/thyromegaly No respiratory distress or stridor  Seprately Identifiable Procedures:  None***  Impression & Plans:  [name] is a 38 y.o. male with the following   ***   - f/u ***   Thank you for allowing me the opportunity to care for your patient. Please do not hesitate to contact me should you have any other questions.  Sincerely, Belma Boxer PA-C [facility] Phone: [phone] Fax: [phone]  05/05/2024, 1:01 PM

## 2024-05-05 NOTE — Progress Notes (Signed)
 Dear Dr. Bonnell Butcher, Here is my assessment for our mutual patient, [name]. Thank you for allowing me the opportunity to care for your patient. Please do not hesitate to contact me should you have any other questions. Sincerely, Belma Boxer PA-C  Otolaryngology Clinic Note Referring provider: Dr. Bonnell Butcher HPI:  [name] is a 38 y.o. male kindly referred by Dr. Bonnell Butcher   The patient is a 38 year old gentleman seen in our office for evaluation of eustachian tube dysfunction.  The patient notes that approximately 1.5 months ago he started developing some pressure in his right ear.  He feels like he needs to pop the ear but it will not pop.  He occasionally notes some clicking sound in the right ear.  He notes he does have somewhat similar sensation on the left but it pops and feels normal.  He denies any significant pain, no ringing in the ear, no hearing loss other than occasional muffled hearing.  He notes he saw his primary care provider who noted fluid within the right middle ear space.  He was started on Clarinex  which he notes has helped his allergic rhinitis but not significantly helped his right ear.  He does report a history of seasonal allergies, he does not take any other medications other than the Clarinex .  He denies any head or neck surgery no head or neck trauma.  No history recurrent ear infections as a kid or in his adult.  No infectious signs or symptoms.    Independent Review of Additional Tests or Records:  Audiological evaluation on 05/05/2024     PMH/Meds/All/SocHx/FamHx/ROS:   Past Medical History:  Diagnosis Date   Abscess    Right forearm healing   ADHD (attention deficit hyperactivity disorder)    Anal fissure    Anxiety    Arthritis    back, shoulders, ankles, knees   Back pain    Bipolar disorder (HCC)    Blood in stool 04/06/2009   Centricity Description: HEMATOCHEZIA Qualifier: Diagnosis of  By: Artelia Laroche  Centricity  Description: MELENA Qualifier: Diagnosis of  By: Artelia Laroche    Chest pain    Chronic lower back pain    Class 3 severe obesity with serious comorbidity and body mass index (BMI) of 50.0 to 59.9 in adult 05/03/2012   Constipation    Depression    Esophagitis    Distal esophageal erosions consistent with mild erosive  reflux esophagitis 12/2008 by EGD    External hemorrhoids    Gastric polyps    Gastric ulcer    GERD (gastroesophageal reflux disease)    Hepatic steatosis    Hiatal hernia    History of stomach ulcers    Hx MRSA infection 06/2007   right thigh   Hx of colonic polyps    Hypercholesteremia    denies   Hypothyroidism    Internal hemorrhoids    Irritable bowel syndrome (IBS)    Lower abdominal pain 03/07/2009   Qualifier: Diagnosis of  By: Kenney Peacemaker     Obesity    Occult GI bleeding 12/2008   Trivial upper GI bleed/uncontrolled GERD by upper endoscopy 12/2008, normal f/u endoscopy 03/2009 with SBCE at that time   OSA on CPAP    Pain management    Pneumonia 09/01/2015   on ATB still (09/04/2015)   S/P colonoscopy 11/10, 10/08   Dr Davonna Estes   Schizophrenia [facility])    Scrotal pain 04/26/2022   Sleep apnea    Stomach  ulcer    Syncope and collapse 05/03/2012   Thyroid  function test abnormal    Noted in 2011 discharge   Tobacco dipper    Wears contact lenses      Past Surgical History:  Procedure Laterality Date   ANKLE FRACTURE SURGERY Left ~ 2008   BACK SURGERY     BIOPSY  11/02/2019   Procedure: BIOPSY;  Surgeon: Annis Kinder, DO;  [facility];  Service: Gastroenterology;;  EGD and Colon   BIOPSY  06/07/2020   Procedure: BIOPSY;  Surgeon: Annis Kinder, DO;  [facility];  Service: Gastroenterology;;   Marianne Shirts PH STUDY N/A 06/07/2020   Procedure: BRAVO PH STUDY on PPI;  Surgeon: Annis Kinder, DO;  [facility]: [facility];  Service: Gastroenterology;  Laterality: N/A;   COLONOSCOPY  10/10/2009   anal  papilla otherwise normal   COLONOSCOPY WITH PROPOFOL  N/A 11/02/2019   Procedure: COLONOSCOPY WITH PROPOFOL ;  Surgeon: Annis Kinder, DO;  [facility];  Service: Gastroenterology;  Laterality: N/A;   ESOPHAGOGASTRODUODENOSCOPY  04/07/2009   Normal esophagus, small hiatal hernia   ESOPHAGOGASTRODUODENOSCOPY  01/09/2009   Distal esophageal erosions consistent with mild erosive reflux esophagitis, otherwise normal esophagus, small hiatal herniaotherwise normal stomach, D1-D2    ESOPHAGOGASTRODUODENOSCOPY  08/26/2007   Normal esophagus, a small hiatal/hernia, otherwise normal stomach D1 through D3   ESOPHAGOGASTRODUODENOSCOPY (EGD) WITH PROPOFOL  N/A 11/02/2019   Procedure: ESOPHAGOGASTRODUODENOSCOPY (EGD) WITH PROPOFOL ;  Surgeon: Annis Kinder, DO;  [facility];  Service: Gastroenterology;  Laterality: N/A;   ESOPHAGOGASTRODUODENOSCOPY (EGD) WITH PROPOFOL  N/A 06/07/2020   Procedure: ESOPHAGOGASTRODUODENOSCOPY (EGD) WITH PROPOFOL ;  Surgeon: Annis Kinder, DO;  [facility]: [facility];  Service: Gastroenterology;  Laterality: N/A;   FRACTURE SURGERY     GASTRIC BYPASS     GASTRIC ROUX-EN-Y N/A 06/05/2021   Procedure: LAPAROSCOPIC ROUX-EN-Y GASTRIC BYPASS WITH UPPER ENDOSCOPY;  Surgeon: Adalberto Acton, MD;  [facility];  Service: General;  Laterality: N/A;   ileocolonoscopy  08/26/2007    A normal rectum, colon, and terminal ileum   INCISION AND DRAINAGE  09/04/2015   reopened my back incsion   LUMBAR LAMINECTOMY/DECOMPRESSION MICRODISCECTOMY Left 09/23/2017   Procedure: Microdiscectomy - left - Lumbar four-Lumbar five;  Surgeon: Agustina Aldrich, MD;  Location: [facility] OR;  Service: Neurosurgery;  Laterality: Left;   LUMBAR MICRODISCECTOMY  08/21/2015   LUMBAR MICRODISCECTOMY Left 09/23/2017   L4-5   LUMBAR WOUND DEBRIDEMENT N/A 09/04/2015   Procedure: LUMBAR WOUND DEBRIDEMENT;  Surgeon: Augusto Blonder, MD;  Location: [facility];  Service: Neurosurgery;   Laterality: N/A;   right side sugery     enlarged lymph node gland removed under arm pit age 28-5 years   SHOULDER ARTHROSCOPY WITH DISTAL CLAVICLE RESECTION Right 11/22/2021   Procedure: SHOULDER ARTHROSCOPY WITH SUBACROMIAL DECOMPRESSION AND DISTAL CLAVICLE EXCISION;  Surgeon: Tonita Frater, MD;  Location: [facility];  Service: Orthopedics;  Laterality: Right;   Small bowel capsule  04/11/2009    normal throughout   VASECTOMY      Family History  Problem Relation Age of Onset   Leukemia Father 33   Colon polyps Father    Clotting disorder Father    Heart disease Father    Cancer Father    Depression Father    Sleep apnea Father    Obesity Father    Seizures Mother    Irritable bowel syndrome Mother    Thyroid  disease Mother    Depression Mother    Anxiety  disorder Mother    Bipolar disorder Brother    ADD / ADHD Brother    Diabetes Paternal Grandmother    Ulcerative colitis Paternal Aunt    Colon cancer Neg Hx    Liver cancer Neg Hx    Esophageal cancer Neg Hx    Stomach cancer Neg Hx    Pancreatic cancer Neg Hx      Social Connections: Moderately Integrated (12/23/2023)   Social Connection and Isolation Panel [NHANES]    Frequency of Communication with Friends and Family: More than three times a week    Frequency of Social Gatherings with Friends and Family: More than three times a week    Attends Religious Services: More than 4 times per year    Active Member of [employer] or Organizations: No    Attends Engineer, structural: Not on file    Marital Status: Married      Current Outpatient Medications:    amphetamine -dextroamphetamine  (ADDERALL ) 20 MG tablet, Take 1 tablet (20 mg total) by mouth daily., Disp: 30 tablet, Rfl: 0   [START ON 05/09/2024] amphetamine -dextroamphetamine  (ADDERALL ) 20 MG tablet, Take 1 tablet (20 mg total) by mouth daily., Disp: 30 tablet, Rfl: 0   [START ON 06/08/2024] amphetamine -dextroamphetamine  (ADDERALL ) 20 MG tablet, Take 1 tablet (20  mg total) by mouth daily., Disp: 30 tablet, Rfl: 0   azelastine  (ASTELIN ) 0.1 % nasal spray, Place 1 spray into both nostrils 2 (two) times daily. Use in each nostril as directed, Disp: 30 mL, Rfl: 0   Calcium  Carbonate (CALCIUM  600 PO), Take 600 mg by mouth in the morning, at noon, and at bedtime., Disp: , Rfl:    cyclobenzaprine  (FLEXERIL ) 10 MG tablet, Take 1 tablet (10 mg total) by mouth 3 (three) times daily as needed. for muscle spams, Disp: 90 tablet, Rfl: 6   desloratadine  (CLARINEX ) 5 MG tablet, Take 1 tablet (5 mg total) by mouth daily. For allergies, Disp: 90 tablet, Rfl: 3   docusate sodium  (COLACE) 250 MG capsule, Take 250 mg by mouth daily., Disp: , Rfl:    EPINEPHrine  0.3 mg/0.3 mL IJ SOAJ injection, Inject 0.3 mg into the muscle as needed for anaphylaxis., Disp: 1 each, Rfl: 0   FLUoxetine  (PROZAC ) 10 MG capsule, Take 1 capsule (10 mg total) by mouth daily., Disp: 90 capsule, Rfl: 3   fluticasone  (FLONASE ) 50 MCG/ACT nasal spray, Place 2 sprays into both nostrils daily., Disp: 16 g, Rfl: 6   gabapentin  (NEURONTIN ) 300 MG capsule, Take 1 capsule (300 mg total) by mouth 3 (three) times daily., Disp: 270 capsule, Rfl: 3   ketoconazole  (NIZORAL ) 2 % cream, Apply 1 Application topically daily. X10-14 days per flare up of fungal rash, Disp: 60 g, Rfl: 1   lamoTRIgine  (LAMICTAL ) 100 MG tablet, Take 1 tablet (100 mg total) by mouth 2 (two) times daily. *note dose change, Disp: 180 tablet, Rfl: 1   levothyroxine  (SYNTHROID ) 75 MCG tablet, Take 1 tablet (75 mcg total) by mouth daily before breakfast., Disp: 90 tablet, Rfl: 3   lidocaine  (LIDODERM ) 5 %, Place 1 patch onto the skin daily. Remove & Discard patch within 12 hours or as directed by MD, Disp: 30 patch, Rfl: 0   Multiple Vitamins-Minerals (BARIATRIC MULTIVITAMINS/IRON PO), Take 1 tablet by mouth in the morning and at bedtime., Disp: , Rfl:    ondansetron  (ZOFRAN -ODT) 4 MG disintegrating tablet, Take 1 tablet (4 mg total) by mouth every  8 (eight) hours as needed for nausea or vomiting., Disp: 20  tablet, Rfl: 0   pantoprazole  (PROTONIX ) 40 MG tablet, TAKE 1 TABLET BY MOUTH ONCE DAILY AS NEEDED FOR  HEARTBURN  OR  INDIGESTION, Disp: 90 tablet, Rfl: 0   Rimegepant Sulfate (NURTEC) 75 MG TBDP, Take 1 tablet by mouth daily as needed (migraine)., Disp: 4 tablet, Rfl: 0   sucralfate  (CARAFATE ) 1 g tablet, Take 1 tablet (1 g total) by mouth 4 (four) times daily -  with meals and at bedtime., Disp: 90 tablet, Rfl: 0   Vitamin D , Ergocalciferol , (DRISDOL ) 1.25 MG (50000 UNIT) CAPS capsule, TAKE 1 CAPSULE BY MOUTH EVERY THURSDAY, Disp: 12 capsule, Rfl: 1   Physical Exam:   BP 125/70   Pulse (!) 101   Ht 5' 11 (1.803 m)   Wt 265 lb (120.2 kg)   SpO2 97%   BMI 36.96 kg/m   Pertinent Findings  CN II-XII intact Right external auditory canal without swelling or erythema, right TM intact with serous effusion, left external auditory canal with no swelling or erythema, TM intact with well pneumatized middle ear space Weber 512: equal Rinne 512: AC > BC b/l  Anterior rhinoscopy: Septum midline; bilateral inferior turbinates with minimal hypertrophy No lesions of oral cavity/oropharynx No obviously palpable neck masses/lymphadenopathy/thyromegaly No respiratory distress or stridor  Seprately Identifiable Procedures:  Procedure Note Pre-procedure diagnosis:  right middle ear effusion Post-procedure diagnosis: Same Procedure: Transnasal Fiberoptic Laryngoscopy, CPT 14782 - Mod 25 Indication: see above Complications: None apparent EBL: 0 mL   The procedure was undertaken to further evaluate the patient's complaint of right middle ear effusion, with mirror exam inadequate for appropriate examination due to gag reflex and poor patient tolerance   Procedure:  Patient was identified as correct patient. Verbal consent was obtained. The nose was sprayed with oxymetazoline and 4% lidocaine . The The flexible laryngoscope was passed through  the nose to view the nasal cavity, pharynx (oropharynx, hypopharynx) and larynx.  The larynx was examined at rest and during multiple phonatory tasks. Documentation was obtained and reviewed with patient. The scope was removed. The patient tolerated the procedure well.   Findings: The nasal cavity and nasopharynx did not reveal any masses or lesions, mucosa appeared to be without obvious lesions. The tongue base, pharyngeal walls, piriform sinuses, vallecula, epiglottis and postcricoid region are normal in appearance. The visualized portion of the subglottis and proximal trachea is widely patent. The vocal folds are mobile bilaterally.. There are no lesions on the free edge of the vocal folds nor elsewhere in the larynx worrisome for malignancy.         Impression & Plans:  Keefer Soulliere is a 38 y.o. male with the following   Eustachian tube function-  38 year old gentleman seen today for evaluation of eustachian tube dysfunction.  He does have some symptoms bilateral, today he has a serous effusion on the right.  I did perform nasal endoscopy today no suspicious lesions, open eustachian tube meatus.  He does have a history of seasonal allergies and does have some minimal turbinate hypertrophy.  I would recommend initiating Flonase  daily, nasal saline irrigation, and continue the Clarinex .  I like to see him back in the office in 2 months with repeat audiological evaluation.  I like him to follow-up sooner as needed.  He verbalized understanding and agreement to today's plan had no further questions or concerns.   - f/u 2 months with repeat audiological evaluation   Thank you for allowing me the opportunity to care for your patient. Please do not hesitate  to contact me should you have any other questions.  Sincerely, Belma Boxer PA-C [facility] Phone: [phone] Fax: [phone]  05/05/2024, 1:36 PM

## 2024-05-07 NOTE — Progress Notes (Signed)
  [address], [address] [place], [place] [address] [phone]  Audiological Evaluation    Name: [name]     DOB:   [birth year]      MRN:   [id number]                                                                                     Service Date: 05/05/2024         Patient comes today after Lorane Rocker, PA-C sent a referral for a hearing evaluation due to concerns with Eustachian tube dysfunction.   Symptoms Yes Details  Hearing loss  []  Feels ears are clogged  Tinnitus  []    Ear pain/ infections/pressure  [x]  ME fluid  , per PCP  Balance problems  []    Noise exposure history  [x]  trucks  Previous ear surgeries  []    Family history of hearing loss  []    Amplification  []    Other  []      Otoscopy: Right ear: Clear external ear canal and notable landmarks visualized on the tympanic membrane. Left ear:  Clear external ear canal and notable landmarks visualized on the tympanic membrane.  Tympanometry: Right ear: Type A- Normal external ear canal volume with normal middle ear pressure and tympanic membrane compliance. Left ear: Type A- Normal external ear canal volume with normal middle ear pressure and tympanic membrane compliance.    Pure tone Audiometry: Normal hearing from [phone].   Speech Audiometry: Right ear- Speech Reception Threshold (SRT) was obtained at 15 dBHL. Left ear-Speech Reception Threshold (SRT) was obtained at 15 dBHL.   Word Recognition Score Tested using NU-6 (recorded) Right ear: 100% was obtained at a presentation level of 55 dBHL with contralateral masking which is deemed as  excellent. Left ear: 100% was obtained at a presentation level of 55 dBHL with contralateral masking which is deemed as  excellent.   The hearing test results were completed under headphones and results are deemed to be of good reliability. Test technique:  conventional    Recommendations: Follow up with ENT as scheduled. Return for a hearing  evaluation if concerns with hearing changes arise or per MD recommendation.   Marcela Alatorre MARIE LEROUX-MARTINEZ, AUD

## 2024-06-19 ENCOUNTER — Emergency Department (HOSPITAL_BASED_OUTPATIENT_CLINIC_OR_DEPARTMENT_OTHER)
Admission: EM | Admit: 2024-06-19 | Discharge: 2024-06-20 | Disposition: A | Discharge status: Home / Self Care | Attending: Emergency Medicine | Admitting: Emergency Medicine

## 2024-06-19 ENCOUNTER — Other Ambulatory Visit: Payer: Self-pay

## 2024-06-19 ENCOUNTER — Emergency Department (HOSPITAL_BASED_OUTPATIENT_CLINIC_OR_DEPARTMENT_OTHER)

## 2024-06-19 DIAGNOSIS — H538 Other visual disturbances: Secondary | ICD-10-CM | POA: Diagnosis not present

## 2024-06-19 DIAGNOSIS — F17200 Nicotine dependence, unspecified, uncomplicated: Secondary | ICD-10-CM | POA: Insufficient documentation

## 2024-06-19 DIAGNOSIS — E039 Hypothyroidism, unspecified: Secondary | ICD-10-CM | POA: Diagnosis not present

## 2024-06-19 DIAGNOSIS — Z79899 Other long term (current) drug therapy: Secondary | ICD-10-CM | POA: Insufficient documentation

## 2024-06-19 DIAGNOSIS — R079 Chest pain, unspecified: Secondary | ICD-10-CM | POA: Insufficient documentation

## 2024-06-19 DIAGNOSIS — R519 Headache, unspecified: Secondary | ICD-10-CM | POA: Diagnosis present

## 2024-06-19 DIAGNOSIS — R110 Nausea: Secondary | ICD-10-CM | POA: Insufficient documentation

## 2024-06-19 LAB — CBC
HCT: 45.7 % (ref 39.0–52.0)
Hemoglobin: 15.6 g/dL (ref 13.0–17.0)
MCH: 31.3 pg (ref 26.0–34.0)
MCHC: 34.1 g/dL (ref 30.0–36.0)
MCV: 91.8 fL (ref 80.0–100.0)
Platelets: 228 K/uL (ref 150–400)
RBC: 4.98 MIL/uL (ref 4.22–5.81)
RDW: 13.3 % (ref 11.5–15.5)
WBC: 7.6 K/uL (ref 4.0–10.5)
nRBC: 0 % (ref 0.0–0.2)

## 2024-06-19 LAB — BASIC METABOLIC PANEL WITH GFR
Anion gap: 14 (ref 5–15)
BUN: 10 mg/dL (ref 6–20)
CO2: 23 mmol/L (ref 22–32)
Calcium: 9.5 mg/dL (ref 8.9–10.3)
Chloride: 102 mmol/L (ref 98–111)
Creatinine, Ser: 1.03 mg/dL (ref 0.61–1.24)
GFR, Estimated: >60 mL/min (ref >60)
Glucose, Bld: 171 mg/dL — ABNORMAL HIGH (ref 70–99)
Potassium: 4.1 mmol/L (ref 3.5–5.1)
Sodium: 139 mmol/L (ref 135–145)

## 2024-06-19 LAB — TROPONIN T, HIGH SENSITIVITY
Troponin T High Sensitivity: <15 ng/L (ref <19)
Troponin T High Sensitivity: <15 ng/L (ref <19)

## 2024-06-19 MED ORDER — SODIUM CHLORIDE 0.9 % IV BOLUS
1000.0000 mL | Freq: Once | INTRAVENOUS | Status: AC
  Administered 2024-06-19: 1000 mL via INTRAVENOUS

## 2024-06-19 MED ORDER — METOCLOPRAMIDE HCL 5 MG/ML IJ SOLN
10.0000 mg | Freq: Once | INTRAMUSCULAR | Status: AC
  Administered 2024-06-19: 10 mg via INTRAVENOUS
  Filled 2024-06-19: qty 2

## 2024-06-19 MED ORDER — IOHEXOL 350 MG/ML SOLN
100.0000 mL | Freq: Once | INTRAVENOUS | Status: AC | PRN
  Administered 2024-06-19: 75 mL via INTRAVENOUS

## 2024-06-19 MED ORDER — PROCHLORPERAZINE EDISYLATE 10 MG/2ML IJ SOLN
10.0000 mg | Freq: Once | INTRAMUSCULAR | Status: AC
  Administered 2024-06-19: 10 mg via INTRAVENOUS
  Filled 2024-06-19: qty 2

## 2024-06-19 MED ORDER — DIPHENHYDRAMINE HCL 50 MG/ML IJ SOLN
25.0000 mg | Freq: Once | INTRAMUSCULAR | Status: AC
  Administered 2024-06-19: 25 mg via INTRAVENOUS
  Filled 2024-06-19: qty 1

## 2024-06-19 MED ORDER — DEXAMETHASONE SODIUM PHOSPHATE 10 MG/ML IJ SOLN
10.0000 mg | Freq: Once | INTRAMUSCULAR | Status: AC
  Administered 2024-06-19: 10 mg via INTRAVENOUS
  Filled 2024-06-19: qty 1

## 2024-06-19 NOTE — ED Triage Notes (Signed)
 Pt POV reporting persistent headache x4 days, now having L side chest pain and blurred vision 2 hours ago. Denies SOB.

## 2024-06-19 NOTE — ED Notes (Signed)
 Belfi MD in triage to evaluate pt

## 2024-06-19 NOTE — ED Notes (Addendum)
 Patient is coming from [facility] for MRI. Patient has had a headache going on for 4 days. Patient was given a migraine cocktail with no improvement.

## 2024-06-19 NOTE — ED Notes (Signed)
 Report called over to [facility] ED and given to Charge RN

## 2024-06-19 NOTE — ED Provider Notes (Signed)
 [place] EMERGENCY DEPARTMENT AT [facility] Provider Note   CSN: [id number] Arrival date & time: 06/19/24  1813     Patient presents with: Chest Pain, Headache, and Blurred Vision   [name] is a 38 y.o. male.   Patient is a 38 year old male who presents with a headache.  He has a history of ADHD, peptic ulcer disease, hyperlipidemia, obstructive sleep apnea and tobacco use.  He has had a 4-day history of a headache.  He said it started about 4 days ago fairly mildly.  Started in the evening when he would lay down to go to sleep.  Its progressively gotten worse over the last couple days.  It has been constant.  Its across the front of his head and sometimes toward the back.  No associated neck pain or stiffness.  No fevers.  Had some nausea but no vomiting.  No numbness or weakness to his extremities.  He had some blurry vision in both of his eyes today.  No blind spots or double vision.  A few hours ago he started having some discomfort in his left chest going up to his left arm.  No associated shortness of breath.  Is not worse with exertion.  No history of prior heart issues.       Prior to Admission medications   Medication Sig Start Date End Date Taking? Authorizing Provider  amphetamine -dextroamphetamine  (ADDERALL ) 20 MG tablet Take 1 tablet (20 mg total) by mouth daily. 04/09/24   Jolinda Norene HERO, DO  amphetamine -dextroamphetamine  (ADDERALL ) 20 MG tablet Take 1 tablet (20 mg total) by mouth daily. 05/09/24   Jolinda Norene HERO, DO  amphetamine -dextroamphetamine  (ADDERALL ) 20 MG tablet Take 1 tablet (20 mg total) by mouth daily. 06/08/24   Jolinda Norene HERO, DO  azelastine  (ASTELIN ) 0.1 % nasal spray Place 1 spray into both nostrils 2 (two) times daily. Use in each nostril as directed 02/25/24   Deitra Morton [name] Nena, NP  Calcium  Carbonate (CALCIUM  600 PO) Take 600 mg by mouth in the morning, at noon, and at bedtime.    [provider]   cyclobenzaprine  (FLEXERIL ) 10 MG tablet Take 1 tablet (10 mg total) by mouth 3 (three) times daily as needed. for muscle spams 12/23/23   Jolinda Norene M, DO  desloratadine  (CLARINEX ) 5 MG tablet Take 1 tablet (5 mg total) by mouth daily. For allergies 12/23/23   Jolinda Norene M, DO  docusate sodium  (COLACE) 250 MG capsule Take 250 mg by mouth daily.    [provider]  EPINEPHrine  0.3 mg/0.3 mL IJ SOAJ injection Inject 0.3 mg into the muscle as needed for anaphylaxis. 12/16/22   Jolinda Norene HERO, DO  FLUoxetine  (PROZAC ) 10 MG capsule Take 1 capsule (10 mg total) by mouth daily. 12/23/23   Jolinda Norene HERO, DO  fluticasone  (FLONASE ) 50 MCG/ACT nasal spray Place 2 sprays into both nostrils daily. 09/23/23   Lavell Lye A, FNP  gabapentin  (NEURONTIN ) 300 MG capsule Take 1 capsule (300 mg total) by mouth 3 (three) times daily. 09/24/23   Jolinda Norene HERO, DO  ketoconazole  (NIZORAL ) 2 % cream Apply 1 Application topically daily. X10-14 days per flare up of fungal rash 12/16/22   Jolinda Norene M, DO  lamoTRIgine  (LAMICTAL ) 100 MG tablet Take 1 tablet (100 mg total) by mouth 2 (two) times daily. *note dose change 12/23/23   Jolinda Norene M, DO  levothyroxine  (SYNTHROID ) 75 MCG tablet Take 1 tablet (75 mcg total) by mouth daily before breakfast. 09/24/23  Jolinda Potter M, DO  lidocaine  (LIDODERM ) 5 % Place 1 patch onto the skin daily. Remove & Discard patch within 12 hours or as directed by MD 07/05/23   Lang Norleen POUR, PA-C  Multiple Vitamins-Minerals (BARIATRIC MULTIVITAMINS/IRON PO) Take 1 tablet by mouth in the morning and at bedtime.    [provider]  ondansetron  (ZOFRAN -ODT) 4 MG disintegrating tablet Take 1 tablet (4 mg total) by mouth every 8 (eight) hours as needed for nausea or vomiting. 07/18/23   Horton, Charmaine FALCON, MD  pantoprazole  (PROTONIX ) 40 MG tablet TAKE 1 TABLET BY MOUTH ONCE DAILY AS NEEDED FOR  HEARTBURN  OR  INDIGESTION 12/13/22    Jolinda Potter M, DO  Rimegepant Sulfate (NURTEC) 75 MG TBDP Take 1 tablet by mouth daily as needed (migraine). 02/20/22   Jolinda Potter HERO, DO  sucralfate  (CARAFATE ) 1 g tablet Take 1 tablet (1 g total) by mouth 4 (four) times daily -  with meals and at bedtime. 07/18/23   Horton, Charmaine FALCON, MD  Vitamin D , Ergocalciferol , (DRISDOL ) 1.25 MG (50000 UNIT) CAPS capsule TAKE 1 CAPSULE BY MOUTH EVERY THURSDAY 03/31/23   Jolinda Potter M, DO    Allergies: Ibuprofen , Influenza vaccines, Tylenol  [acetaminophen ], Bee venom, and Tramadol     Review of Systems  Constitutional:  Negative for chills, diaphoresis, fatigue and fever.  HENT:  Negative for congestion, rhinorrhea and sneezing.   Eyes: Negative.   Respiratory:  Negative for cough, chest tightness and shortness of breath.   Cardiovascular:  Positive for chest pain. Negative for leg swelling.  Gastrointestinal:  Positive for nausea. Negative for abdominal pain, blood in stool, diarrhea and vomiting.  Genitourinary:  Negative for difficulty urinating, flank pain, frequency and hematuria.  Musculoskeletal:  Negative for arthralgias and back pain.  Skin:  Negative for rash.  Neurological:  Positive for headaches. Negative for dizziness, speech difficulty, weakness and numbness.    Updated Vital Signs BP (!) 100/59   Pulse 65   Temp 98.2 F (36.8 C) (Oral)   Resp 15   Ht 5' 10 (1.778 m)   Wt 120.2 kg   SpO2 96%   BMI 38.02 kg/m   Physical Exam Constitutional:      Appearance: He is well-developed.  HENT:     Head: Normocephalic and atraumatic.  Eyes:     Pupils: Pupils are equal, round, and reactive to light.  Cardiovascular:     Rate and Rhythm: Normal rate and regular rhythm.     Heart sounds: Normal heart sounds.  Pulmonary:     Effort: Pulmonary effort is normal. No respiratory distress.     Breath sounds: Normal breath sounds. No wheezing or rales.  Chest:     Chest wall: No tenderness.  Abdominal:     General:  Bowel sounds are normal.     Palpations: Abdomen is soft.     Tenderness: There is no abdominal tenderness. There is no guarding or rebound.  Musculoskeletal:        General: Normal range of motion.     Cervical back: Normal range of motion and neck supple.  Lymphadenopathy:     Cervical: No cervical adenopathy.  Skin:    General: Skin is warm and dry.     Findings: No rash.  Neurological:     Mental Status: He is alert and oriented to person, place, and time.     Comments: Motor 5/5 all extremities Sensation grossly intact to LT all extremities Finger to Nose intact, no pronator  drift CN II-XII grossly intact Visual fields full to confrontation      (all labs ordered are listed, but only abnormal results are displayed) Labs Reviewed  BASIC METABOLIC PANEL WITH GFR - Abnormal; Notable for the following components:      Result Value   Glucose, Bld 171 (*)    All other components within normal limits  CBC  TROPONIN T, HIGH SENSITIVITY  TROPONIN T, HIGH SENSITIVITY    EKG: EKG Interpretation Date/Time:  Saturday June 19 2024 18:23:05 EDT Ventricular Rate:  102 PR Interval:  142 QRS Duration:  94 QT Interval:  332 QTC Calculation: 432 R Axis:   44  Text Interpretation: Sinus tachycardia Otherwise normal ECG When compared with ECG of 29-Jul-2022 11:58, No significant change was found SINCE LAST TRACING HEART RATE HAS INCREASED Confirmed by Lenor Hollering [phone]) on 06/19/2024 8:33:20 PM  Radiology: CT Angio Head W or Wo Contrast Result Date: 06/19/2024 CLINICAL DATA:  Headache, sudden, severe EXAM: CT ANGIOGRAPHY HEAD TECHNIQUE: Multidetector CT imaging of the head was performed using the standard protocol during bolus administration of intravenous contrast. Multiplanar CT image reconstructions and MIPs were obtained to evaluate the vascular anatomy. RADIATION DOSE REDUCTION: This exam was performed according to the departmental dose-optimization program which includes  automated exposure control, adjustment of the mA and/or kV according to patient size and/or use of iterative reconstruction technique. CONTRAST:  75mL OMNIPAQUE  IOHEXOL  350 MG/ML SOLN COMPARISON:  CT head September 08, 2023 FINDINGS: CT HEAD Brain: No evidence of acute infarction, hemorrhage, hydrocephalus, extra-axial collection or mass lesion/mass effect. Vascular: See below. Skull: No acute fracture. Sinuses: Clear sinuses.  No acute orbital findings. Other: No mastoid effusions. CTA HEAD Anterior circulation: Bilateral intracranial ICAs, MCAs and ACAs appear patent. There is a possible severe stenosis of a proximal right M2 MCA branch; however, this is difficult to confirm given limited study with venous contamination. No aneurysm identified. Posterior circulation: Bilateral intradural vertebral arteries, basilar artery and bilateral posterior arteries are patent without proximal hemodynamically significant stenosis. Venous sinuses: As permitted by contrast timing, patent. Review of the MIP images confirms the above findings. IMPRESSION: 1. On CT head, no evidence of acute abnormality. 2. On CTA, no definite large vessel occlusion on limited evaluation. 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 If the patient's symptoms localize to the right MCA territory, recommend repeat CTA or MRA to better characterize. Electronically Signed   By: Gilmore GORMAN Molt M.D.   On: 06/19/2024 20:10   DG Chest Port 1 View Result Date: 06/19/2024 CLINICAL DATA:  Chest pain on the left for a few hours, initial encounter EXAM: PORTABLE CHEST 1 VIEW COMPARISON:  07/05/2023 FINDINGS: Cardiac shadow is within normal limits. Lungs are well aerated bilaterally. No focal infiltrate or sizable effusion is seen. No bony abnormality is noted. IMPRESSION: No acute abnormality seen. Electronically Signed   By: Oneil Devonshire M.D.   On:  06/19/2024 19:09     Procedures   Medications Ordered in the ED  prochlorperazine  (COMPAZINE ) injection 10 mg (has no administration in time range)  metoCLOPramide  (REGLAN ) injection 10 mg (10 mg Intravenous Given 06/19/24 1910)  diphenhydrAMINE  (BENADRYL ) injection 25 mg (25 mg Intravenous Given 06/19/24 1910)  dexamethasone  (DECADRON ) injection 10 mg (10 mg Intravenous Given 06/19/24 1914)  sodium chloride  0.9 % bolus 1,000 mL (0 mLs Intravenous Stopped 06/19/24 2009)  iohexol  (OMNIPAQUE ) 350 MG/ML injection 100 mL (75  mLs Intravenous Contrast Given 06/19/24 1938)                                    Medical Decision Making Amount and/or Complexity of Data Reviewed Labs: ordered. Radiology: ordered.  Risk Prescription drug management.   This patient presents to the ED for concern of headache, chest pain, this involves an extensive number of treatment options, and is a complaint that carries with it a high risk of complications and morbidity.  I considered the following differential and admission for this acute, potentially life threatening condition.  The differential diagnosis includes migraine, meningitis, subarachnoid hemorrhage, brain mass, stroke, ACS  MDM:    Patient is a 38 year old male who presents with a headache that is gradually been getting worse over the last 4 days.  He started having some chest pain in the last couple hours.  His EKG does not show any ischemic changes.  Has had 2 negative troponins.  He does not have any focal neurologic deficits.  He does report some blurry vision but no visual field deficit or double vision.  CTA was performed.  There is no evidence of intracranial hemorrhage.  No aneurysm.  There is some concerning stenosis of the M2 branch.  I reviewed this with Dr. Voncile with neurology.  He agrees that it looks concerning for stenosis.  He would like to have the patient go over for an MRI with and without contrast.  He feels that the vessels were  adequately seen on the CTA and then an MRA would not offer any additional benefit.  Discussed with Dr. Cathryn who is excepted the patient to transfer to [facility] for MRI.  Patient is amenable to this.  He was initially given a migraine cocktail.  It helped his headache just a little bit but he still had a pretty significant headache.  Will give him a dose of Compazine .  (Labs, imaging, consults)  Labs: I Ordered, and personally interpreted labs.  The pertinent results include: Normal troponins, otherwise nonconcerning  Imaging Studies ordered: I ordered imaging studies including CTA of the head I independently visualized and interpreted imaging. I agree with the radiologist interpretation  Additional history obtained from chart.  External records from outside source obtained and reviewed including prior notes/history  Cardiac Monitoring: The patient was maintained on a cardiac monitor.  If on the cardiac monitor, I personally viewed and interpreted the cardiac monitored which showed an underlying rhythm of: Sinus rhythm  Reevaluation: After the interventions noted above, I reevaluated the patient and found that they have :improved  Social Determinants of Health:    Disposition: Transfer to [facility] morbidities that complicate the patient evaluation  Past Medical History:  Diagnosis Date   Abscess    Right forearm healing   ADHD (attention deficit hyperactivity disorder)    Anal fissure    Anxiety    Arthritis    back, shoulders, ankles, knees   Back pain    Bipolar disorder (HCC)    Blood in stool 04/06/2009   Centricity Description: HEMATOCHEZIA Qualifier: Diagnosis of  By: [name]  Centricity Description: MELENA Qualifier: Diagnosis of  By: [name]    Chest pain    Chronic lower back pain    Class 3 severe obesity with serious comorbidity and body mass index (BMI) of 50.0 to 59.9 in adult 05/03/2012   Constipation  Depression     Esophagitis    Distal esophageal erosions consistent with mild erosive  reflux esophagitis 12/2008 by EGD    External hemorrhoids    Gastric polyps    Gastric ulcer    GERD (gastroesophageal reflux disease)    Hepatic steatosis    Hiatal hernia    History of stomach ulcers    Hx MRSA infection 06/2007   right thigh   Hx of colonic polyps    Hypercholesteremia    denies   Hypothyroidism    Internal hemorrhoids    Irritable bowel syndrome (IBS)    Lower abdominal pain 03/07/2009   Qualifier: Diagnosis of  By: Georgina Palma     Obesity    Occult GI bleeding 12/2008   Trivial upper GI bleed/uncontrolled GERD by upper endoscopy 12/2008, normal f/u endoscopy 03/2009 with SBCE at that time   OSA on CPAP    Pain management    Pneumonia 09/01/2015   on ATB still (09/04/2015)   S/P colonoscopy 11/10, 10/08   Dr Belvia   Schizophrenia [facility])    Scrotal pain 04/26/2022   Sleep apnea    Stomach ulcer    Syncope and collapse 05/03/2012   Thyroid  function test abnormal    Noted in 2011 discharge   Tobacco dipper    Wears contact lenses      Medicines Meds ordered this encounter  Medications   metoCLOPramide  (REGLAN ) injection 10 mg   diphenhydrAMINE  (BENADRYL ) injection 25 mg   dexamethasone  (DECADRON ) injection 10 mg   sodium chloride  0.9 % bolus 1,000 mL   iohexol  (OMNIPAQUE ) 350 MG/ML injection 100 mL   prochlorperazine  (COMPAZINE ) injection 10 mg    I have reviewed the patients home medicines and have made adjustments as needed  Problem List / ED Course: Problem List Items Addressed This Visit   None Visit Diagnoses       Acute nonintractable headache, unspecified headache type    -  Primary                Final diagnoses:  Acute nonintractable headache, unspecified headache type    ED Discharge Orders     None          Lenor Hollering, MD 06/19/24 2123

## 2024-06-19 NOTE — ED Provider Notes (Signed)
  Physical Exam  BP 115/64 (BP Location: Right Arm)   Pulse 61   Temp 97.9 F (36.6 C) (Oral)   Resp 18   Ht 5' 10 (1.778 m)   Wt 120.2 kg   SpO2 97%   BMI 38.02 kg/m   Physical Exam  Procedures  Procedures  ED Course / MDM   Clinical Course as of 06/20/24 0715  Sun Jun 20, 2024  0623 Repeat consult to neurologist Dr. Deedra; patient most consistent with complex migraine, will need outpatient follow-up with for migraine headache and for right M2 MCA stenosis, however no indication for admission or further ED workup at this time.  If migraine cocktail is not sufficient at this time, may try single dose of valproate 500 IV prior to discharge. [RS]    Clinical Course User Index [RS] Kamorah Nevils, Pleasant SAUNDERS, PA-C   Medical Decision Making Amount and/or Complexity of Data Reviewed Labs: ordered. Radiology: ordered.  Risk Prescription drug management.    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  Please see note by preceding ED provider Dr. Carolee for further insight of the patient's ED course prior to transfer.  In brief, patient initially presented for 4 days of persistent headache now with blurry vision without any blind spots, double vision, numbness tingling or weakness in the extremities.  Abdominal chest pain.  Patient was transferred for MRI with and without of the brain per recommendation of neurologist Dr. Arora.  Patient with resolution of his visual changes, improved but mildly persistent headache.  He is eager to be discharged home at this time, and is amenable to close outpatient follow-up with neurology.  Clinical concern for emergent underlying condition that would warrant further ED workup and patient management is exceedingly low.  [name] and his wife voiced understanding of his medical evaluation and treatment plan. Each of their questions answered  to their expressed satisfaction.  Return precautions were given.  Patient is well-appearing, stable, and was discharged in good condition.  This chart was dictated using voice recognition software, Dragon. Despite the best efforts of this provider to proofread and correct errors, errors may still occur which can change documentation meaning.        Bobette Pleasant SAUNDERS, PA-C 06/20/24 0715    Cottie Donnice PARAS, MD 06/20/24 1355

## 2024-06-20 ENCOUNTER — Emergency Department (HOSPITAL_COMMUNITY)

## 2024-06-20 MED ORDER — METOCLOPRAMIDE HCL 5 MG/ML IJ SOLN
10.0000 mg | Freq: Once | INTRAMUSCULAR | Status: AC
  Administered 2024-06-20: 10 mg via INTRAVENOUS
  Filled 2024-06-20: qty 2

## 2024-06-20 MED ORDER — KETOROLAC TROMETHAMINE 15 MG/ML IJ SOLN
15.0000 mg | Freq: Once | INTRAMUSCULAR | Status: AC
  Administered 2024-06-20: 15 mg via INTRAVENOUS
  Filled 2024-06-20: qty 1

## 2024-06-20 MED ORDER — GADOBUTROL 1 MMOL/ML IV SOLN
10.0000 mL | Freq: Once | INTRAVENOUS | Status: AC | PRN
  Administered 2024-06-20: 10 mL via INTRAVENOUS

## 2024-06-20 MED ORDER — PROCHLORPERAZINE EDISYLATE 10 MG/2ML IJ SOLN
10.0000 mg | Freq: Once | INTRAMUSCULAR | Status: AC
  Administered 2024-06-20: 10 mg via INTRAVENOUS
  Filled 2024-06-20: qty 2

## 2024-06-20 NOTE — Discharge Instructions (Addendum)
 You are seen in the ER today for your headache and vision changes.  Fortunately there is no evidence of stroke on your workup.  There was a narrowing of one of the blood vessels in your brain which need to follow-up closely with the neurologist in the outpatient setting.  It is suspected that the cause of your symptoms today was a migraine.  Please discuss this with neurology in the outpatient setting and return to the ER with any severe symptoms.

## 2024-06-23 ENCOUNTER — Encounter: Payer: Self-pay | Admitting: Family Medicine

## 2024-06-23 ENCOUNTER — Telehealth: Admitting: Family Medicine

## 2024-06-23 DIAGNOSIS — F902 Attention-deficit hyperactivity disorder, combined type: Secondary | ICD-10-CM

## 2024-06-23 DIAGNOSIS — I6601 Occlusion and stenosis of right middle cerebral artery: Secondary | ICD-10-CM

## 2024-06-23 MED ORDER — AMPHETAMINE-DEXTROAMPHETAMINE 20 MG PO TABS: 20.0000 mg | ORAL_TABLET | Freq: Every day | ORAL | 0 refills | Status: DC

## 2024-06-23 NOTE — Progress Notes (Signed)
 MyChart Video visit  Subjective: CC: ADHD follow-up PCP: Jolinda Norene HERO, DO YEP:[name] is a 38 y.o. male. Patient provides verbal consent for consult held via video.  Due to COVID-19 pandemic this visit was conducted virtually. This visit type was conducted due to national recommendations for restrictions regarding the COVID-19 Pandemic (e.g. social distancing, sheltering in place) in an effort to limit this patient's exposure and mitigate transmission in our community. All issues noted in this document were discussed and addressed.  A physical exam was not performed with this format.   Location of patient: car Location of provider: [facility] Others present for call: none  1.  ADHD He reports this is chronic and stable.  Denies any uncontrolled symptoms or intolerance to medications.  He did report that he had a severe headache and there was concern for possible mini stroke so he was seen in the ER and they did imaging which showed some abnormalities and he will be seeing neurology for this tomorrow.  He is feeling well now but wanted to make mention of this new finding   ROS: Per HPI  Allergies  Allergen Reactions   Ibuprofen  Other (See Comments)    Makes ulcers bleed   Influenza Vaccines Shortness Of Breath and Other (See Comments)    Rash and unable to breathe well   Tylenol  [Acetaminophen ] Hives   Bee Venom Swelling    SWELLING REACTION UNSPECIFIED    Tramadol  Itching   Past Medical History:  Diagnosis Date   Abscess    Right forearm healing   ADHD (attention deficit hyperactivity disorder)    Anal fissure    Anxiety    Arthritis    back, shoulders, ankles, knees   Back pain    Bipolar disorder (HCC)    Blood in stool 04/06/2009   Centricity Description: HEMATOCHEZIA Qualifier: Diagnosis of  By: Ezzard [name]  Centricity Description: MELENA Qualifier: Diagnosis of  By: Ezzard [name]    Chest pain    Chronic lower back pain    Class 3 severe  obesity with serious comorbidity and body mass index (BMI) of 50.0 to 59.9 in adult 05/03/2012   Constipation    Depression    Esophagitis    Distal esophageal erosions consistent with mild erosive  reflux esophagitis 12/2008 by EGD    External hemorrhoids    Gastric polyps    Gastric ulcer    GERD (gastroesophageal reflux disease)    Hepatic steatosis    Hiatal hernia    History of stomach ulcers    Hx MRSA infection 06/2007   right thigh   Hx of colonic polyps    Hypercholesteremia    denies   Hypothyroidism    Internal hemorrhoids    Irritable bowel syndrome (IBS)    Lower abdominal pain 03/07/2009   Qualifier: Diagnosis of  By: Georgina Palma     Obesity    Occult GI bleeding 12/2008   Trivial upper GI bleed/uncontrolled GERD by upper endoscopy 12/2008, normal f/u endoscopy 03/2009 with [facility] at that time   OSA on CPAP    Pain management    Pneumonia 09/01/2015   on ATB still (09/04/2015)   S/P colonoscopy 11/10, 10/08   Dr Belvia   Schizophrenia [facility])    Scrotal pain 04/26/2022   Sleep apnea    Stomach ulcer    Syncope and collapse 05/03/2012   Thyroid  function test abnormal    Noted in 2011 discharge   Tobacco  dipper    Wears contact lenses     Current Outpatient Medications:    amphetamine -dextroamphetamine  (ADDERALL ) 20 MG tablet, Take 1 tablet (20 mg total) by mouth daily., Disp: 30 tablet, Rfl: 0   amphetamine -dextroamphetamine  (ADDERALL ) 20 MG tablet, Take 1 tablet (20 mg total) by mouth daily., Disp: 30 tablet, Rfl: 0   amphetamine -dextroamphetamine  (ADDERALL ) 20 MG tablet, Take 1 tablet (20 mg total) by mouth daily., Disp: 30 tablet, Rfl: 0   azelastine  (ASTELIN ) 0.1 % nasal spray, Place 1 spray into both nostrils 2 (two) times daily. Use in each nostril as directed, Disp: 30 mL, Rfl: 0   Calcium  Carbonate (CALCIUM  600 PO), Take 600 mg by mouth in the morning, at noon, and at bedtime., Disp: , Rfl:    cyclobenzaprine  (FLEXERIL ) 10 MG tablet, Take 1  tablet (10 mg total) by mouth 3 (three) times daily as needed. for muscle spams, Disp: 90 tablet, Rfl: 6   desloratadine  (CLARINEX ) 5 MG tablet, Take 1 tablet (5 mg total) by mouth daily. For allergies, Disp: 90 tablet, Rfl: 3   docusate sodium  (COLACE) 250 MG capsule, Take 250 mg by mouth daily., Disp: , Rfl:    EPINEPHrine  0.3 mg/0.3 mL IJ SOAJ injection, Inject 0.3 mg into the muscle as needed for anaphylaxis., Disp: 1 each, Rfl: 0   FLUoxetine  (PROZAC ) 10 MG capsule, Take 1 capsule (10 mg total) by mouth daily., Disp: 90 capsule, Rfl: 3   fluticasone  (FLONASE ) 50 MCG/ACT nasal spray, Place 2 sprays into both nostrils daily., Disp: 16 g, Rfl: 6   gabapentin  (NEURONTIN ) 300 MG capsule, Take 1 capsule (300 mg total) by mouth 3 (three) times daily., Disp: 270 capsule, Rfl: 3   ketoconazole  (NIZORAL ) 2 % cream, Apply 1 Application topically daily. X10-14 days per flare up of fungal rash, Disp: 60 g, Rfl: 1   lamoTRIgine  (LAMICTAL ) 100 MG tablet, Take 1 tablet (100 mg total) by mouth 2 (two) times daily. *note dose change, Disp: 180 tablet, Rfl: 1   levothyroxine  (SYNTHROID ) 75 MCG tablet, Take 1 tablet (75 mcg total) by mouth daily before breakfast., Disp: 90 tablet, Rfl: 3   lidocaine  (LIDODERM ) 5 %, Place 1 patch onto the skin daily. Remove & Discard patch within 12 hours or as directed by MD, Disp: 30 patch, Rfl: 0   Multiple Vitamins-Minerals (BARIATRIC MULTIVITAMINS/IRON PO), Take 1 tablet by mouth in the morning and at bedtime., Disp: , Rfl:    ondansetron  (ZOFRAN -ODT) 4 MG disintegrating tablet, Take 1 tablet (4 mg total) by mouth every 8 (eight) hours as needed for nausea or vomiting., Disp: 20 tablet, Rfl: 0   pantoprazole  (PROTONIX ) 40 MG tablet, TAKE 1 TABLET BY MOUTH ONCE DAILY AS NEEDED FOR  HEARTBURN  OR  INDIGESTION, Disp: 90 tablet, Rfl: 0   Rimegepant Sulfate (NURTEC) 75 MG TBDP, Take 1 tablet by mouth daily as needed (migraine)., Disp: 4 tablet, Rfl: 0   sucralfate  (CARAFATE ) 1 g  tablet, Take 1 tablet (1 g total) by mouth 4 (four) times daily -  with meals and at bedtime., Disp: 90 tablet, Rfl: 0   Vitamin D , Ergocalciferol , (DRISDOL ) 1.25 MG (50000 UNIT) CAPS capsule, TAKE 1 CAPSULE BY MOUTH EVERY THURSDAY, Disp: 12 capsule, Rfl: 1  Gen: well appearing male, NAD HEENT: sclera white, MMM Neuro: Oriented.  No focal neurologic deficit is identified on video visit   Assessment/ Plan: 38 y.o. male   Attention deficit hyperactivity disorder (ADHD), combined type - Plan: amphetamine -dextroamphetamine  (ADDERALL ) 20 MG tablet,  amphetamine -dextroamphetamine  (ADDERALL ) 20 MG tablet, amphetamine -dextroamphetamine  (ADDERALL ) 20 MG tablet  Middle cerebral artery stenosis, right  ADHD is stable.  UDS and CSA are up-to-date as per office policy and the national narcotic database was reviewed with no red flags.  Postdated scripts sent  Recently had a migraine headache and was found to have what appears to be severe MCA stenosis of the M2 branch on the right.  Neurology was consulted and he has an appointment tomorrow with Dr. Chalice.  I spoke she will likely need referral to vascular versus neurosurgery but he is going to hold off on appointment with her tomorrow and message me where he wants to be referred to.  Right now he is feeling well.  No concerns.  Start time: 1:01pm End time: 1:08pm  Total time spent on patient care (including video visit/ documentation): 9 minutes  [name] CHRISTELLA Fielding, DO [facility] [phone]

## 2024-06-24 ENCOUNTER — Ambulatory Visit (INDEPENDENT_AMBULATORY_CARE_PROVIDER_SITE_OTHER): Admitting: Neurology

## 2024-06-24 ENCOUNTER — Encounter: Payer: Self-pay | Admitting: Neurology

## 2024-06-24 ENCOUNTER — Telehealth: Payer: Self-pay | Admitting: Neurology

## 2024-06-24 VITALS — BP 141/89 | HR 90 | Ht 71.0 in | Wt 283.0 lb

## 2024-06-24 DIAGNOSIS — R519 Headache, unspecified: Secondary | ICD-10-CM | POA: Diagnosis not present

## 2024-06-24 MED ORDER — NURTEC 75 MG PO TBDP: 1.0000 | ORAL_TABLET | Freq: Every day | ORAL | Status: AC | PRN

## 2024-06-24 MED ORDER — VERAPAMIL HCL ER 100 MG PO CP24: 100.0000 mg | ORAL_CAPSULE | Freq: Every day | ORAL | 2 refills | Status: DC

## 2024-06-24 MED ORDER — NURTEC 75 MG PO TBDP: 75.0000 mg | ORAL_TABLET | Freq: Once | ORAL | 1 refills | Status: DC | PRN

## 2024-06-24 NOTE — Progress Notes (Addendum)
 [facility]  Provider:  Dr Chalice Referring Provider: Bobette Pleasant Gardner, * Primary Care Physician:  Phillip Norene HERO, DO  Chief Complaint  Patient presents with   Headache    Rm 2 alone Pt is well, reports he started having bad headaches about 2 weeks ago. Currently has had a headache daily. Associated blurred vision.  No other concerns     HPI:  [name] is a 38 y.o. male and seen here on 06/24/2024 upon referral from Dr. Bobette for a Consultation/ Evaluation of HA..  This patient reports onset of a new HA over a period of 14 days .  He saw urgent care and due to blurred vision was send to [facility]- 12 hours observation. . We look at his CT angio and his MRI brain here today, his elevated BP. He is treated for thyroid  disease, too. Has had OSA but this resolved reportedly with weight loss.   [name] reports that over the last 2 years he has lost 100 160 pounds of body mass, reportedly after he developed bleeding gastric ulcers he had a major abdominal surgery that left him with a de facto bariatric surgery.  He was not a headachy person and has really never had headache in the past which is concerning.  These headaches started only 2 weeks ago and they became a daily occurrence associated with blurred vision he also has a latent nausea, bright lights do not bother him and neither loud sounds. He describes very illustrative that his headaches are perceived as a jackhammer severe throbbing pounding on the head above the high forehead they are not left or right lateral but involve both sides.  They do not radiate to the back or to the neck, he does not have tinnitus or changed auditory perception, he does not have vertigo or balance problems, nor neck stiffness.   This HA does get worse with sneezing or coughing.  Last 3 days woken up with the headache , same quality of pain.  Twana may have influenced his higher BP today.       IMPRESSION:  06-19-2024: CT angio  1. On CT head, no evidence of acute abnormality. 2. On CTA, no definite large vessel occlusion on limited evaluation. 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 If the patient's symptoms localize to the right MCA territory, recommend repeat CTA or MRA to better characterize.  Reviewed in the presence of the patient .   MRI IMPRESSION: 1. No acute intracranial abnormality. 2. No mass or abnormal enhancement.   Electronically signed by: Lonni Necessary MD 07/27/2  Review of Systems: Out of a complete 14 system review, the patient complains of only the following symptoms, and all other reviewed systems are negative.  Social History   Socioeconomic History   Marital status: Married    Spouse name: Tabitha   Number of children: 2   Years of education: Not on file   Highest education level: 12th grade  Occupational History   Occupation: Lobbyist: [employer]   Occupation: truck driver  Tobacco Use   Smoking status: Never   Smokeless tobacco: Former    Types: Snuff    Quit date: 12/16/2020   Tobacco comments:    quitting snuff. whinning off   Vaping Use   Vaping status: Never Used  Substance and Sexual Activity   Alcohol  use: Not Currently  Drug use: No   Sexual activity: Not Currently  Other Topics Concern   Not on file  Social History Narrative   Not on file   Social Drivers of Health   Financial Resource Strain: Low Risk  (12/23/2023)   Overall Financial Resource Strain (CARDIA)    Difficulty of Paying Living Expenses: Not hard at all  Food Insecurity: No Food Insecurity (12/23/2023)   Hunger Vital Sign    Worried About Running Out of Food in the Last Year: Never true    Ran Out of Food in the Last Year: Never true  Transportation Needs: No Transportation Needs (12/23/2023)   PRAPARE - Scientist, research (physical sciences) (Medical): No    Lack of Transportation (Non-Medical): No  Physical Activity: Sufficiently Active (12/23/2023)   Exercise Vital Sign    Days of Exercise per Week: 6 days    Minutes of Exercise per Session: 150+ min  Stress: No Stress Concern Present (12/23/2023)   [employer] of Occupational Health - Occupational Stress Questionnaire    Feeling of Stress : Not at all  Social Connections: Moderately Integrated (12/23/2023)   Social Connection and Isolation Panel    Frequency of Communication with Friends and Family: More than three times a week    Frequency of Social Gatherings with Friends and Family: More than three times a week    Attends Religious Services: More than 4 times per year    Active Member of [employer] or Organizations: No    Attends Engineer, structural: Not on file    Marital Status: Married  Catering manager Violence: Not on file    Family History  Problem Relation Age of Onset   Leukemia Father 35   Colon polyps Father    Clotting disorder Father    Heart disease Father    Cancer Father    Depression Father    Sleep apnea Father    Obesity Father    Seizures Mother    Irritable bowel syndrome Mother    Thyroid  disease Mother    Depression Mother    Anxiety disorder Mother    Bipolar disorder Brother    ADD / ADHD Brother    Diabetes Paternal Grandmother    Ulcerative colitis Paternal Aunt    Colon cancer Neg Hx    Liver cancer Neg Hx    Esophageal cancer Neg Hx    Stomach cancer Neg Hx    Pancreatic cancer Neg Hx     Past Medical History:  Diagnosis Date   Abscess    Right forearm healing   ADHD (attention deficit hyperactivity disorder)    Anal fissure    Anxiety    Arthritis    back, shoulders, ankles, knees   Back pain    Bipolar disorder (HCC)    Blood in stool 04/06/2009   Centricity Description: HEMATOCHEZIA Qualifier: Diagnosis of  By: Ezzard DEVONNA Sonny GORMAN.  Centricity Description: MELENA Qualifier: Diagnosis of   By: Ezzard [name]    Chest pain    Chronic lower back pain    Class 3 severe obesity with serious comorbidity and body mass index (BMI) of 50.0 to 59.9 in adult 05/03/2012   Constipation    Depression    Esophagitis    Distal esophageal erosions consistent with mild erosive  reflux esophagitis 12/2008 by EGD    External hemorrhoids    Gastric polyps    Gastric ulcer    GERD (gastroesophageal reflux disease)  Hepatic steatosis    Hiatal hernia    History of stomach ulcers    Hx MRSA infection 06/2007   right thigh   Hx of colonic polyps    Hypercholesteremia    denies   Hypothyroidism    Internal hemorrhoids    Irritable bowel syndrome (IBS)    Lower abdominal pain 03/07/2009   Qualifier: Diagnosis of  By: Georgina Palma     Obesity    Occult GI bleeding 12/2008   Trivial upper GI bleed/uncontrolled GERD by upper endoscopy 12/2008, normal f/u endoscopy 03/2009 with SBCE at that time   OSA on CPAP    Pain management    Pneumonia 09/01/2015   on ATB still (09/04/2015)   S/P colonoscopy 11/10, 10/08   Dr Belvia   Schizophrenia [facility])    Scrotal pain 04/26/2022   Sleep apnea    Stomach ulcer    Syncope and collapse 05/03/2012   Thyroid  function test abnormal    Noted in 2011 discharge   Tobacco dipper    Wears contact lenses     Past Surgical History:  Procedure Laterality Date   ANKLE FRACTURE SURGERY Left ~ 2008   BACK SURGERY     BIOPSY  11/02/2019   Procedure: BIOPSY;  Surgeon: San Sandor GAILS, DO;  Location: [facility];  Service: Gastroenterology;;  EGD and Colon   BIOPSY  06/07/2020   Procedure: BIOPSY;  Surgeon: San Sandor GAILS, DO;  Location: [facility];  Service: Gastroenterology;;   DOLLENE PH STUDY N/A 06/07/2020   Procedure: BRAVO PH STUDY on PPI;  Surgeon: San Sandor GAILS, DO;  Location: [facility];  Service: Gastroenterology;  Laterality: N/A;   COLONOSCOPY  10/10/2009   anal papilla otherwise normal   COLONOSCOPY WITH  PROPOFOL  N/A 11/02/2019   Procedure: COLONOSCOPY WITH PROPOFOL ;  Surgeon: San Sandor GAILS, DO;  Location: [facility];  Service: Gastroenterology;  Laterality: N/A;   ESOPHAGOGASTRODUODENOSCOPY  04/07/2009   Normal esophagus, small hiatal hernia   ESOPHAGOGASTRODUODENOSCOPY  01/09/2009   Distal esophageal erosions consistent with mild erosive reflux esophagitis, otherwise normal esophagus, small hiatal herniaotherwise normal stomach, D1-D2    ESOPHAGOGASTRODUODENOSCOPY  08/26/2007   Normal esophagus, a small hiatal/hernia, otherwise normal stomach D1 through D3   ESOPHAGOGASTRODUODENOSCOPY (EGD) WITH PROPOFOL  N/A 11/02/2019   Procedure: ESOPHAGOGASTRODUODENOSCOPY (EGD) WITH PROPOFOL ;  Surgeon: San Sandor GAILS, DO;  Location: [facility];  Service: Gastroenterology;  Laterality: N/A;   ESOPHAGOGASTRODUODENOSCOPY (EGD) WITH PROPOFOL  N/A 06/07/2020   Procedure: ESOPHAGOGASTRODUODENOSCOPY (EGD) WITH PROPOFOL ;  Surgeon: San Sandor GAILS, DO;  Location: [facility];  Service: Gastroenterology;  Laterality: N/A;   FRACTURE SURGERY     GASTRIC BYPASS     GASTRIC ROUX-EN-Y N/A 06/05/2021   Procedure: LAPAROSCOPIC ROUX-EN-Y GASTRIC BYPASS WITH UPPER ENDOSCOPY;  Surgeon: Signe Mitzie LABOR, MD;  Location: [facility];  Service: General;  Laterality: N/A;   ileocolonoscopy  08/26/2007    A normal rectum, colon, and terminal ileum   INCISION AND DRAINAGE  09/04/2015   reopened my back incsion   LUMBAR LAMINECTOMY/DECOMPRESSION MICRODISCECTOMY Left 09/23/2017   Procedure: Microdiscectomy - left - Lumbar four-Lumbar five;  Surgeon: Louis Shove, MD;  Location: [facility] OR;  Service: Neurosurgery;  Laterality: Left;   LUMBAR MICRODISCECTOMY  08/21/2015   LUMBAR MICRODISCECTOMY Left 09/23/2017   L4-5   LUMBAR WOUND DEBRIDEMENT N/A 09/04/2015   Procedure: LUMBAR WOUND DEBRIDEMENT;  Surgeon: Gerldine Maizes, MD;  Location: [facility];  Service: Neurosurgery;  Laterality: N/A;   right side sugery  enlarged lymph node gland removed under arm pit age 46-5 years   SHOULDER ARTHROSCOPY WITH DISTAL CLAVICLE RESECTION Right 11/22/2021   Procedure: SHOULDER ARTHROSCOPY WITH SUBACROMIAL DECOMPRESSION AND DISTAL CLAVICLE EXCISION;  Surgeon: Onesimo Oneil LABOR, MD;  Location: [facility];  Service: Orthopedics;  Laterality: Right;   Small bowel capsule  04/11/2009    normal throughout   VASECTOMY      Current Outpatient Medications  Medication Sig Dispense Refill   [START ON 07/17/2024] amphetamine -dextroamphetamine  (ADDERALL ) 20 MG tablet Take 1 tablet (20 mg total) by mouth daily. 30 tablet 0   [START ON 08/16/2024] amphetamine -dextroamphetamine  (ADDERALL ) 20 MG tablet Take 1 tablet (20 mg total) by mouth daily. 30 tablet 0   [START ON 09/15/2024] amphetamine -dextroamphetamine  (ADDERALL ) 20 MG tablet Take 1 tablet (20 mg total) by mouth daily. 30 tablet 0   azelastine  (ASTELIN ) 0.1 % nasal spray Place 1 spray into both nostrils 2 (two) times daily. Use in each nostril as directed 30 mL 0   Calcium  Carbonate (CALCIUM  600 PO) Take 600 mg by mouth in the morning, at noon, and at bedtime.     cyclobenzaprine  (FLEXERIL ) 10 MG tablet Take 1 tablet (10 mg total) by mouth 3 (three) times daily as needed. for muscle spams 90 tablet 6   desloratadine  (CLARINEX ) 5 MG tablet Take 1 tablet (5 mg total) by mouth daily. For allergies 90 tablet 3   docusate sodium  (COLACE) 250 MG capsule Take 250 mg by mouth daily.     EPINEPHrine  0.3 mg/0.3 mL IJ SOAJ injection Inject 0.3 mg into the muscle as needed for anaphylaxis. 1 each 0   FLUoxetine  (PROZAC ) 10 MG capsule Take 1 capsule (10 mg total) by mouth daily. 90 capsule 3   fluticasone  (FLONASE ) 50 MCG/ACT nasal spray Place 2 sprays into both nostrils daily. 16 g 6   gabapentin  (NEURONTIN ) 300 MG capsule Take 1 capsule (300 mg total) by mouth 3 (three) times daily. 270 capsule 3   ketoconazole  (NIZORAL ) 2 % cream Apply 1 Application topically daily. X10-14 days per flare up  of fungal rash 60 g 1   lamoTRIgine  (LAMICTAL ) 100 MG tablet Take 1 tablet (100 mg total) by mouth 2 (two) times daily. *note dose change 180 tablet 1   levothyroxine  (SYNTHROID ) 75 MCG tablet Take 1 tablet (75 mcg total) by mouth daily before breakfast. 90 tablet 3   lidocaine  (LIDODERM ) 5 % Place 1 patch onto the skin daily. Remove & Discard patch within 12 hours or as directed by MD 30 patch 0   Multiple Vitamins-Minerals (BARIATRIC MULTIVITAMINS/IRON PO) Take 1 tablet by mouth in the morning and at bedtime.     ondansetron  (ZOFRAN -ODT) 4 MG disintegrating tablet Take 1 tablet (4 mg total) by mouth every 8 (eight) hours as needed for nausea or vomiting. 20 tablet 0   pantoprazole  (PROTONIX ) 40 MG tablet TAKE 1 TABLET BY MOUTH ONCE DAILY AS NEEDED FOR  HEARTBURN  OR  INDIGESTION 90 tablet 0   sucralfate  (CARAFATE ) 1 g tablet Take 1 tablet (1 g total) by mouth 4 (four) times daily -  with meals and at bedtime. 90 tablet 0   verapamil  (VERELAN ) 100 MG 24 hr capsule Take 1 capsule (100 mg total) by mouth at bedtime. 30 capsule 2   Vitamin D , Ergocalciferol , (DRISDOL ) 1.25 MG (50000 UNIT) CAPS capsule TAKE 1 CAPSULE BY MOUTH EVERY THURSDAY 12 capsule 1   Rimegepant Sulfate (NURTEC) 75 MG TBDP Take 1 tablet (75 mg total) by mouth daily  as needed (migraine).     No current facility-administered medications for this visit.    Allergies as of 06/24/2024 - Review Complete 06/24/2024  Allergen Reaction Noted   Ibuprofen  Other (See Comments) 11/04/2011   Influenza vaccines Shortness Of Breath and Other (See Comments) 05/12/2014   Tylenol  [acetaminophen ] Hives 05/02/2012   Bee venom Swelling 06/11/2015   Tramadol  Itching 04/10/2014    Vitals: BP (!) 141/89   Pulse 90   Ht 5' 11 (1.803 m)   Wt 283 lb (128.4 kg)   BMI 39.47 kg/m   @No  data found.     @VITALSLAST3  [[id number]]@ Physical exam:  General: The patient is awake, alert and appears not in acute distress.  The patient is well  groomed. Head: Normocephalic, atraumatic.  Neck is supple. No paraspinal tenderness. No retroauricular pain, no indurated temporal arteries.  Neck circumference:17. 5   Cardiovascular:  Regular rate and palpable peripheral pulse:  Respiratory: clear to auscultation.  Mallampati 2, Skin:  Without evidence of edema, or rash Trunk: obese    Neurologic exam : The patient is awake and alert, oriented to place and time.   Memory subjective  described as intact.  There is a normal attention span & concentration ability.  Speech is fluent without  dysarthria, dysphonia or aphasia.  Mood and affect are appropriate.  Cranial nerves: Pupils are equal and briskly reactive to light.  Funduscopic exam evidence of pallor or edema.  Extraocular movements  in vertical and horizontal planes intact and without nystagmus. Visual fields by finger perimetry are intact. Hearing to finger rub intact.  Facial sensation intact to fine touch. Facial motor strength is symmetric and tongue and uvula move midline.  Motor exam:   Normal tone and normal muscle bulk and symmetric normal strength in all extremities. Grip Strength equal  Proximal strength of shoulder muscles and hip flexors was inatct .  Sensory:  Fine touch and vibration were tested .  Proprioception was tested in the upper extremities only and was  normal.  Gait and station: Patient walked with/ without assistive device .  Core Strength within normal limits.   Deep tendon reflexes: in the  upper and lower extremities could not be elicited.    Assessment: Total time for face to face interview and examination, for review of  images and laboratory testing, neurophysiology testing and pre-existing records, including out-of -network , was 45 minutes. Assessment is as follows here:  1)  NEW onset daily HA, non- migrainous, throbbing,pounding and located to the high- frontal and parietal areas bilaterally,  not waxing and waning . Ongoing.  2)  CT  angio may reflect an artefact but the symptoms are not related to a right hemispheric cerebro vascular stenosis, these are non -focal.  Still, , he is suspected for now r to have cerebrovascular disease and cannot use triptans.   Plan:  Treatment plan and additional workup planned after today includes:   1) I will order an MRA and MRV.  2) Starting a non triptan:  Nurtec samples given in office and he took the tab here. SABRA   3) starting a preventive HA medication- calcium  channel blocker.  Only to be taken at night. VERAPAMIL .   4) remember to take  sinus/ rhinitis meds regularly.   5) reduce caffeine intake to 2 caffeinated bev a day.    RV after MRV and MRA, in 2 months with headache journal.    Orders Placed This Encounter  Procedures   MR Venogram Head  MR ANGIO HEAD WO CONTRAST      The patient's condition requires frequent monitoring and adjustments in the treatment plan, reflecting the ongoing complexity of care.  This provider is the continuing focal point for all needed services for this condition.   Dedra Gores, MD  [facility] Board certified by The [employer] of Sleep Medicine and Diplomate of the [employer] of Sleep Medicine. Board certified In Neurology through the ABPN, Fellow of the [employer] of Neu[employer].

## 2024-06-24 NOTE — Patient Instructions (Addendum)
 Assessment: Total time for face to face interview and examination, for review of  images and laboratory testing, neurophysiology testing and pre-existing records, including out-of -network , was 45 minutes. Assessment is as follows here:  1)  NEW onset daily HA, non- migrainous, throbbing,pounding and located to the high- frontal and parietal areas bilaterally,  not waxing and waning . Ongoing.  2)  CT angio may reflect an artefact but the symptoms are not related to a right hemispheric cerebro vascular stenosis, these are non -focal.   Plan:  Treatment plan and additional workup planned after today includes:   1) I will order an MRA and MRV.  2) Starting a non triptan:  Nurtec samples given in office and he took the tab here. SABRA   3) starting a preventive HA medication- calcium  channel blocker.  Only to be taken at night. VERAPAMIL .   4) remember to take  sinus/ rhinitis meds regularly.   5) reduce caffeine intake to 2 caffeinated bev a day.    RV after MRV and MRA, in 2 months with headache journal.    Tension Headache, Adult A tension headache is a feeling of pain, pressure, or aching over the front and sides of the head. The pain can be dull, or it can feel tight. There are two types of tension headache: Episodic tension headache. This is when the headaches happen fewer than 15 days a month. Chronic tension headache. This is when the headaches happen more than 15 days a month during a 38-month period. A tension headache can last from 30 minutes to several days. It is the most common kind of headache. Tension headaches are not normally associated with nausea or vomiting, and they do not get worse with physical activity. What are the causes? The exact cause of this condition is not known. Tension headaches are often triggered by stress, anxiety, or depression. Other triggers may include: Alcohol . Too much caffeine or caffeine withdrawal. Respiratory infections, such as colds, flu, or  sinus infections. Dental problems or teeth clenching. Fatigue. Holding your head and neck in the same position for a long period of time, such as while using a computer. Smoking. Arthritis of the neck. What are the signs or symptoms? Symptoms of this condition include: A feeling of pressure or tightness around the head. Dull, aching head pain. Pain over the front and sides of the head. Tenderness in the muscles of the head, neck, and shoulders. How is this diagnosed? This condition may be diagnosed based on your symptoms, your medical history, and a physical exam. If your symptoms are severe or unusual, you may have imaging tests, such as a CT scan or an MRI of your head. Your vision may also be checked. How is this treated? This condition may be treated with lifestyle changes and with medicines that help relieve symptoms. Follow these instructions at home: Managing pain Take over-the-counter and prescription medicines only as told by your health care provider. When you have a headache, lie down in a dark, quiet room. If directed, put ice on your head and neck. To do this: Put ice in a plastic bag. Place a towel between your skin and the bag. Leave the ice on for 20 minutes, 2-3 times a day. Remove the ice if your skin turns bright red. This is very important. If you cannot feel pain, heat, or cold, you have a greater risk of damage to the area. If directed, apply heat to the back of your neck as often  as told by your health care provider. Use the heat source that your health care provider recommends, such as a moist heat pack or a heating pad. Place a towel between your skin and the heat source. Leave the heat on for 20-30 minutes. Remove the heat if your skin turns bright red. This is especially important if you are unable to feel pain, heat, or cold. You have a greater risk of getting burned. Eating and drinking Eat meals on a regular schedule. If you drink alcohol : Limit how much  you have to: 0-1 drink a day for women who are not pregnant. 0-2 drinks a day for men. Know how much alcohol  is in your drink. In the [place] one drink equals one 12 oz bottle of beer (355 mL), one 5 oz glass of wine (148 mL), or one 1 oz glass of hard liquor (44 mL). Drink enough fluid to keep your urine pale yellow. Decrease your caffeine intake, or stop using caffeine. Lifestyle Get 7-9 hours of sleep each night, or get the amount of sleep recommended by your health care provider. At bedtime, remove computers, phones, and tablets from your room. Find ways to manage your stress. This may include: Exercise. Deep breathing exercises. Yoga. Listening to music. Positive mental imagery. Try to sit up straight and avoid tensing your muscles. Do not use any products that contain nicotine or tobacco. These include cigarettes, chewing tobacco, and vaping devices, such as e-cigarettes. If you need help quitting, ask your health care provider. General instructions  Avoid any headache triggers. Keep a journal to help find out what may trigger your headaches. For example, write down: What you eat and drink. How much sleep you get. Any change to your diet or medicines. Keep all follow-up visits. This is important. Contact a health care provider if: Your headache does not get better. Your headache comes back. You are sensitive to sounds, light, or smells because of a headache. You have nausea or you vomit. Your stomach hurts. Get help right away if: You suddenly develop a severe headache, along with any of the following: A stiff neck. Nausea and vomiting. Confusion. Weakness in one part or one side of your body. Double vision or loss of vision. Shortness of breath. Rash. Unusual sleepiness. Fever or chills. Trouble speaking. Pain in your eye or ear. Trouble walking or balancing. Feeling faint or passing out. Summary A tension headache is a feeling of pain, pressure, or aching over the  front and sides of the head. A tension headache can last from 30 minutes to several days. It is the most common kind of headache. This condition may be diagnosed based on your symptoms, your medical history, and a physical exam. This condition may be treated with lifestyle changes and with medicines that help relieve symptoms. This information is not intended to replace advice given to you by your health care provider. Make sure you discuss any questions you have with your health care provider. Document Revised: 08/08/2023 Document Reviewed: 08/08/2023 Elsevier Patient Education  2024 [employer].

## 2024-06-24 NOTE — Telephone Encounter (Signed)
 sent to GI they obtain [place] [phone]

## 2024-06-25 ENCOUNTER — Other Ambulatory Visit (HOSPITAL_COMMUNITY): Payer: Self-pay

## 2024-06-25 ENCOUNTER — Telehealth: Payer: Self-pay

## 2024-06-25 DIAGNOSIS — G43001 Migraine without aura, not intractable, with status migrainosus: Secondary | ICD-10-CM

## 2024-06-25 NOTE — Telephone Encounter (Signed)
 I received a request via fax to complete PA for Nurtec. States it is indicated for Migraine however the chart states DX of R51.9 Headache. Please advise.

## 2024-06-28 ENCOUNTER — Other Ambulatory Visit (HOSPITAL_COMMUNITY): Payer: Self-pay

## 2024-06-28 DIAGNOSIS — G43909 Migraine, unspecified, not intractable, without status migrainosus: Secondary | ICD-10-CM | POA: Insufficient documentation

## 2024-06-28 NOTE — Telephone Encounter (Signed)
 Pharmacy Patient Advocate Encounter  Received notification from [facility] that Prior Authorization for Nurtec 75MG  dispersible tablets has been APPROVED from 06/28/2024 to 06/28/2025. Ran test claim, Copay is $0. This test claim was processed through [facility]- copay amounts may vary at other pharmacies due to pharmacy/plan contracts, or as the patient moves through the different stages of their insurance plan.   PA #/Case ID/Reference #: PA Case ID #: [id number]

## 2024-06-28 NOTE — Telephone Encounter (Signed)
 Pharmacy Patient Advocate Encounter   Received notification from Fax that prior authorization for Nurtec 75MG  dispersible tablets is required/requested.   Insurance verification completed.   The patient is insured through [employer] .   Per test claim: PA required; PA started via CoverMyMeds. KEY [id number] . Waiting for clinical questions to populate.

## 2024-06-28 NOTE — Telephone Encounter (Signed)
 I have added migraine ICD code G43.909 to the pt chart. Please use this

## 2024-06-28 NOTE — Telephone Encounter (Signed)
 Clinical questions have been answered and PA submitted. PA currently Pending. Please be advised that most companies allow up to 30 days to make a decision. We will advise when a determination has been made, or follow up in 1 week.   Please reach out to our team, Rx Prior Auth Pool, if you haven't heard back in a week.

## 2024-07-05 ENCOUNTER — Encounter (INDEPENDENT_AMBULATORY_CARE_PROVIDER_SITE_OTHER): Payer: Self-pay | Admitting: Physician Assistant

## 2024-07-05 ENCOUNTER — Ambulatory Visit (INDEPENDENT_AMBULATORY_CARE_PROVIDER_SITE_OTHER): Admitting: Audiology

## 2024-07-05 ENCOUNTER — Ambulatory Visit (INDEPENDENT_AMBULATORY_CARE_PROVIDER_SITE_OTHER): Admitting: Physician Assistant

## 2024-07-05 VITALS — BP 133/85 | HR 79

## 2024-07-05 DIAGNOSIS — H9391 Unspecified disorder of right ear: Secondary | ICD-10-CM | POA: Diagnosis not present

## 2024-07-05 DIAGNOSIS — H6993 Unspecified Eustachian tube disorder, bilateral: Secondary | ICD-10-CM

## 2024-07-05 DIAGNOSIS — Z0110 Encounter for examination of ears and hearing without abnormal findings: Secondary | ICD-10-CM

## 2024-07-05 DIAGNOSIS — H93291 Other abnormal auditory perceptions, right ear: Secondary | ICD-10-CM

## 2024-07-05 NOTE — Progress Notes (Signed)
  [address] [place], [place] [address] [phone]  Audiological Evaluation    Name: [name]     DOB:   1986/03/19      MRN:   [id number]                                                                                     Service Date: 07/05/2024     Accompanied by: unaccompanied   Patient comes today after Reyes Cohen, PA-C sent a referral for a hearing evaluation due to concerns with right ear pressure.   Symptoms Yes Details  Hearing loss  [x]  05-05-24: Normal hearing from [id number] Hz.   Tinnitus  []    Ear pain/ infections/pressure  [x]  Reports still feels right sided ear pressure/fluid.  Balance problems  []    Noise exposure history  [x]  trucks   Previous ear surgeries  []    Family history of hearing loss  []    Amplification  []    Other  []      Otoscopy: Right ear: Clear external ear canal and notable landmarks visualized on the tympanic membrane. Left ear:  Clear external ear canal and notable landmarks visualized on the tympanic membrane.  Tympanometry: Right ear: Type A- Normal external ear canal volume with normal middle ear pressure and tympanic membrane compliance. Left ear: Type A- Normal external ear canal volume with normal middle ear pressure and tympanic membrane compliance.    Pure tone Audiometry: Both ears- Normal hearing from [id number] Hz.  Speech Audiometry: Right ear- Speech Reception Threshold (SRT) was obtained at 10 dBHL. Left ear-Speech Reception Threshold (SRT) was obtained at 10 dBHL.   Word Recognition Score Tested using NU-6 (recorded) Right ear: 100% was obtained at a presentation level of 50 dBHL with contralateral masking which is deemed as  excellent. Left ear: 100% was obtained at a presentation level of 50 dBHL with contralateral masking which is deemed as  excellent.   The hearing test results were completed under headphones and results are deemed to be of good reliability. Test technique:  conventional       Recommendations: Follow up with ENT as scheduled for today.   Neoma Uhrich MARIE LEROUX-MARTINEZ, AUD

## 2024-07-05 NOTE — Progress Notes (Addendum)
 Dear Dr. Jolinda, Here is my assessment for our mutual patient, [name]. Thank you for allowing me the opportunity to care for your patient. Please do not hesitate to contact me should you have any other questions. Sincerely, Chyrl Cohen PA-C  Otolaryngology Clinic Note Referring provider: Dr. Jolinda HPI:  [name] is a 38 y.o. male kindly referred by Dr. Jolinda   The patient is a 38 year old gentleman seen in our office for evaluation of eustachian tube dysfunction.  The patient was last seen in the office on 05/05/2024.  Below is a recap of that encounter.  Update 07/05/2024.  Since his last office visit he denies any significant changes, he has been using the Flonase , Clarinex  and saline irrigations with no significant improvement in his symptoms.  He continues to have fullness in the right ear, he feels like if he massages behind his right ear he can get his ear to drain.  He notes muffled hearing out of that ear and feels like the ear needs to pop.  Since I saw him last he had several ER visits for neurologic symptoms, he had CTs and MRIs.  His CT head showed no middle ear effusion or significant abnormality around the middle ear.    Independent Review of Additional Tests or Records:  Audiological evaluation 07/05/2024  Normal audiological evaluation with bilateral type a tympanometry     PMH/Meds/All/SocHx/FamHx/ROS:   Past Medical History:  Diagnosis Date   Abscess    Right forearm healing   ADHD (attention deficit hyperactivity disorder)    Anal fissure    Anxiety    Arthritis    back, shoulders, ankles, knees   Back pain    Bipolar disorder (HCC)    Blood in stool 04/06/2009   Centricity Description: HEMATOCHEZIA Qualifier: Diagnosis of  By: Ezzard [name]  Centricity Description: MELENA Qualifier: Diagnosis of  By: Ezzard [name]    Chest pain    Chronic lower back pain    Class 3 severe obesity with serious comorbidity and  body mass index (BMI) of 50.0 to 59.9 in adult 05/03/2012   Constipation    Depression    Esophagitis    Distal esophageal erosions consistent with mild erosive  reflux esophagitis 12/2008 by EGD    External hemorrhoids    Gastric polyps    Gastric ulcer    GERD (gastroesophageal reflux disease)    Hepatic steatosis    Hiatal hernia    History of stomach ulcers    Hx MRSA infection 06/2007   right thigh   Hx of colonic polyps    Hypercholesteremia    denies   Hypothyroidism    Internal hemorrhoids    Irritable bowel syndrome (IBS)    Lower abdominal pain 03/07/2009   Qualifier: Diagnosis of  By: Georgina Palma     Obesity    Occult GI bleeding 12/2008   Trivial upper GI bleed/uncontrolled GERD by upper endoscopy 12/2008, normal f/u endoscopy 03/2009 with SBCE at that time   OSA on CPAP    Pain management    Pneumonia 09/01/2015   on ATB still (09/04/2015)   S/P colonoscopy 11/10, 10/08   Dr Belvia   Schizophrenia [facility])    Scrotal pain 04/26/2022   Sleep apnea    Stomach ulcer    Syncope and collapse 05/03/2012   Thyroid  function test abnormal    Noted in 2011 discharge   Tobacco dipper    Wears contact lenses  Past Surgical History:  Procedure Laterality Date   ANKLE FRACTURE SURGERY Left ~ 2008   BACK SURGERY     BIOPSY  11/02/2019   Procedure: BIOPSY;  Surgeon: San Sandor GAILS, DO;  Location: [facility];  Service: Gastroenterology;;  EGD and Colon   BIOPSY  06/07/2020   Procedure: BIOPSY;  Surgeon: San Sandor GAILS, DO;  Location: [facility];  Service: Gastroenterology;;   BRAVO PH STUDY N/A 06/07/2020   Procedure: BRAVO PH STUDY on PPI;  Surgeon: San Sandor GAILS, DO;  Location: [facility];  Service: Gastroenterology;  Laterality: N/A;   COLONOSCOPY  10/10/2009   anal papilla otherwise normal   COLONOSCOPY WITH PROPOFOL  N/A 11/02/2019   Procedure: COLONOSCOPY WITH PROPOFOL ;  Surgeon: San Sandor GAILS, DO;  Location: [facility];   Service: Gastroenterology;  Laterality: N/A;   ESOPHAGOGASTRODUODENOSCOPY  04/07/2009   Normal esophagus, small hiatal hernia   ESOPHAGOGASTRODUODENOSCOPY  01/09/2009   Distal esophageal erosions consistent with mild erosive reflux esophagitis, otherwise normal esophagus, small hiatal herniaotherwise normal stomach, D1-D2    ESOPHAGOGASTRODUODENOSCOPY  08/26/2007   Normal esophagus, a small hiatal/hernia, otherwise normal stomach D1 through D3   ESOPHAGOGASTRODUODENOSCOPY (EGD) WITH PROPOFOL  N/A 11/02/2019   Procedure: ESOPHAGOGASTRODUODENOSCOPY (EGD) WITH PROPOFOL ;  Surgeon: San Sandor GAILS, DO;  Location: [facility];  Service: Gastroenterology;  Laterality: N/A;   ESOPHAGOGASTRODUODENOSCOPY (EGD) WITH PROPOFOL  N/A 06/07/2020   Procedure: ESOPHAGOGASTRODUODENOSCOPY (EGD) WITH PROPOFOL ;  Surgeon: San Sandor GAILS, DO;  Location: [facility];  Service: Gastroenterology;  Laterality: N/A;   FRACTURE SURGERY     GASTRIC BYPASS     GASTRIC ROUX-EN-Y N/A 06/05/2021   Procedure: LAPAROSCOPIC ROUX-EN-Y GASTRIC BYPASS WITH UPPER ENDOSCOPY;  Surgeon: Signe Mitzie LABOR, MD;  Location: [facility];  Service: General;  Laterality: N/A;   ileocolonoscopy  08/26/2007    A normal rectum, colon, and terminal ileum   INCISION AND DRAINAGE  09/04/2015   reopened my back incsion   LUMBAR LAMINECTOMY/DECOMPRESSION MICRODISCECTOMY Left 09/23/2017   Procedure: Microdiscectomy - left - Lumbar four-Lumbar five;  Surgeon: Louis Shove, MD;  [facility];  Service: Neurosurgery;  Laterality: Left;   LUMBAR MICRODISCECTOMY  08/21/2015   LUMBAR MICRODISCECTOMY Left 09/23/2017   L4-5   LUMBAR WOUND DEBRIDEMENT N/A 09/04/2015   Procedure: LUMBAR WOUND DEBRIDEMENT;  Surgeon: Gerldine Maizes, MD;  Location: [facility];  Service: Neurosurgery;  Laterality: N/A;   right side sugery     enlarged lymph node gland removed under arm pit age 70-5 years   SHOULDER ARTHROSCOPY WITH DISTAL CLAVICLE RESECTION Right  11/22/2021   Procedure: SHOULDER ARTHROSCOPY WITH SUBACROMIAL DECOMPRESSION AND DISTAL CLAVICLE EXCISION;  Surgeon: Onesimo Oneil LABOR, MD;  Location: [facility];  Service: Orthopedics;  Laterality: Right;   Small bowel capsule  04/11/2009    normal throughout   VASECTOMY      Family History  Problem Relation Age of Onset   Leukemia Father 73   Colon polyps Father    Clotting disorder Father    Heart disease Father    Cancer Father    Depression Father    Sleep apnea Father    Obesity Father    Seizures Mother    Irritable bowel syndrome Mother    Thyroid  disease Mother    Depression Mother    Anxiety disorder Mother    Bipolar disorder Brother    ADD / ADHD Brother    Diabetes Paternal Grandmother    Ulcerative colitis Paternal Aunt    Colon cancer Neg Hx  Liver cancer Neg Hx    Esophageal cancer Neg Hx    Stomach cancer Neg Hx    Pancreatic cancer Neg Hx      Social Connections: Moderately Integrated (12/23/2023)   Social Connection and Isolation Panel    Frequency of Communication with Friends and Family: More than three times a week    Frequency of Social Gatherings with Friends and Family: More than three times a week    Attends Religious Services: More than 4 times per year    Active Member of [employer] or Organizations: No    Attends Engineer, structural: Not on file    Marital Status: Married      Current Outpatient Medications:    [START ON 07/17/2024] amphetamine -dextroamphetamine  (ADDERALL ) 20 MG tablet, Take 1 tablet (20 mg total) by mouth daily., Disp: 30 tablet, Rfl: 0   [START ON 08/16/2024] amphetamine -dextroamphetamine  (ADDERALL ) 20 MG tablet, Take 1 tablet (20 mg total) by mouth daily., Disp: 30 tablet, Rfl: 0   [START ON 09/15/2024] amphetamine -dextroamphetamine  (ADDERALL ) 20 MG tablet, Take 1 tablet (20 mg total) by mouth daily., Disp: 30 tablet, Rfl: 0   azelastine  (ASTELIN ) 0.1 % nasal spray, Place 1 spray into both nostrils 2 (two) times daily. Use  in each nostril as directed, Disp: 30 mL, Rfl: 0   Calcium  Carbonate (CALCIUM  600 PO), Take 600 mg by mouth in the morning, at noon, and at bedtime., Disp: , Rfl:    cyclobenzaprine  (FLEXERIL ) 10 MG tablet, Take 1 tablet (10 mg total) by mouth 3 (three) times daily as needed. for muscle spams, Disp: 90 tablet, Rfl: 6   desloratadine  (CLARINEX ) 5 MG tablet, Take 1 tablet (5 mg total) by mouth daily. For allergies, Disp: 90 tablet, Rfl: 3   docusate sodium  (COLACE) 250 MG capsule, Take 250 mg by mouth daily., Disp: , Rfl:    EPINEPHrine  0.3 mg/0.3 mL IJ SOAJ injection, Inject 0.3 mg into the muscle as needed for anaphylaxis., Disp: 1 each, Rfl: 0   FLUoxetine  (PROZAC ) 10 MG capsule, Take 1 capsule (10 mg total) by mouth daily., Disp: 90 capsule, Rfl: 3   fluticasone  (FLONASE ) 50 MCG/ACT nasal spray, Place 2 sprays into both nostrils daily., Disp: 16 g, Rfl: 6   gabapentin  (NEURONTIN ) 300 MG capsule, Take 1 capsule (300 mg total) by mouth 3 (three) times daily., Disp: 270 capsule, Rfl: 3   ketoconazole  (NIZORAL ) 2 % cream, Apply 1 Application topically daily. X10-14 days per flare up of fungal rash, Disp: 60 g, Rfl: 1   lamoTRIgine  (LAMICTAL ) 100 MG tablet, Take 1 tablet (100 mg total) by mouth 2 (two) times daily. *note dose change, Disp: 180 tablet, Rfl: 1   levothyroxine  (SYNTHROID ) 75 MCG tablet, Take 1 tablet (75 mcg total) by mouth daily before breakfast., Disp: 90 tablet, Rfl: 3   lidocaine  (LIDODERM ) 5 %, Place 1 patch onto the skin daily. Remove & Discard patch within 12 hours or as directed by MD, Disp: 30 patch, Rfl: 0   Multiple Vitamins-Minerals (BARIATRIC MULTIVITAMINS/IRON PO), Take 1 tablet by mouth in the morning and at bedtime., Disp: , Rfl:    ondansetron  (ZOFRAN -ODT) 4 MG disintegrating tablet, Take 1 tablet (4 mg total) by mouth every 8 (eight) hours as needed for nausea or vomiting., Disp: 20 tablet, Rfl: 0   pantoprazole  (PROTONIX ) 40 MG tablet, TAKE 1 TABLET BY MOUTH ONCE DAILY AS  NEEDED FOR  HEARTBURN  OR  INDIGESTION, Disp: 90 tablet, Rfl: 0   Rimegepant Sulfate (NURTEC)  75 MG TBDP, Take 1 tablet (75 mg total) by mouth daily as needed (migraine)., Disp: , Rfl:    sucralfate  (CARAFATE ) 1 g tablet, Take 1 tablet (1 g total) by mouth 4 (four) times daily -  with meals and at bedtime., Disp: 90 tablet, Rfl: 0   verapamil  (VERELAN ) 100 MG 24 hr capsule, Take 1 capsule (100 mg total) by mouth at bedtime., Disp: 30 capsule, Rfl: 2   Vitamin D , Ergocalciferol , (DRISDOL ) 1.25 MG (50000 UNIT) CAPS capsule, TAKE 1 CAPSULE BY MOUTH EVERY THURSDAY, Disp: 12 capsule, Rfl: 1   Physical Exam:   BP 133/85   Pulse 79   SpO2 98%   Pertinent Findings  CN II-XII intact Bilateral EAC clear and TM intact with well pneumatized middle ear spaces No lesions of oral cavity/oropharynx; dentition several dental carries  No obviously palpable neck masses/lymphadenopathy/thyromegaly No respiratory distress or stridor  Seprately Identifiable Procedures:  None  Impression & Plans:  [name] is a 38 y.o. male with the following   Right ear fullness-  Patient presents today with right ear fullness, symptoms consistent with eustachian tube although sign of this on audiological evaluation.  Overall the patient appears to be doing well, he has normal hearing, bilateral type a tympanometry, and review his his most recent CT last month that showed no middle ear effusion or significant abnormality.  At this time I would recommend the patient continue treating allergies and see if he has any improvement in symptoms, have low likelihood that he would have significant improvement with tympanostomy tube or balloon dilation given the normal audiological evaluation but if he continues to have symptoms in 6 months from now he will reach out to our office to discuss further treatment options.  The patient verbalized understanding and agreement to today's plan had no further questions or concerns.   -  f/u 6 months if still having significant symptoms.   Thank you for allowing me the opportunity to care for your patient. Please do not hesitate to contact me should you have any other questions.  Sincerely, Chyrl Cohen PA-C [facility] Phone: [phone] Fax: [phone]  07/05/2024, 9:34 AM

## 2024-07-12 ENCOUNTER — Inpatient Hospital Stay
Admission: RE | Admit: 2024-07-12 | Discharge: 2024-07-12 | Discharge status: Home / Self Care | Source: Ambulatory Visit | Attending: Neurology | Admitting: Neurology

## 2024-07-12 ENCOUNTER — Ambulatory Visit
Admission: RE | Admit: 2024-07-12 | Discharge: 2024-07-12 | Disposition: A | Discharge status: Home / Self Care | Source: Ambulatory Visit | Attending: Neurology | Admitting: Neurology

## 2024-07-12 DIAGNOSIS — R519 Headache, unspecified: Secondary | ICD-10-CM

## 2024-07-12 MED ORDER — GADOPICLENOL 0.5 MMOL/ML IV SOLN: 10.0000 mL | Freq: Once | INTRAVENOUS | Status: DC | PRN

## 2024-07-13 ENCOUNTER — Encounter: Payer: Self-pay | Admitting: Audiology

## 2024-07-14 ENCOUNTER — Ambulatory Visit: Payer: Self-pay | Admitting: Neurology

## 2024-07-18 ENCOUNTER — Other Ambulatory Visit: Payer: Self-pay | Admitting: Family Medicine

## 2024-07-18 DIAGNOSIS — F3177 Bipolar disorder, in partial remission, most recent episode mixed: Secondary | ICD-10-CM

## 2024-07-21 NOTE — Telephone Encounter (Signed)
-----   Message from Inverness Dohmeier sent at 07/14/2024  4:39 PM EDT ----- Normal venogram by [facility].  In patient with new onset severe headaches.  ----- Message ----- From: Vear Charlie LABOR, MD Sent: 07/12/2024   5:45 PM EDT To: Dedra Gores, MD

## 2024-07-21 NOTE — Telephone Encounter (Signed)
 LEFT MSG 1ST ATTEMPT BY HF 07/21/24

## 2024-07-28 ENCOUNTER — Encounter: Payer: Self-pay | Admitting: Neurology

## 2024-08-16 ENCOUNTER — Ambulatory Visit: Payer: 59 | Admitting: Family Medicine

## 2024-08-16 VITALS — BP 115/74 | HR 92 | Temp 97.9°F | Ht 71.0 in | Wt 277.1 lb

## 2024-08-16 DIAGNOSIS — F902 Attention-deficit hyperactivity disorder, combined type: Secondary | ICD-10-CM

## 2024-08-16 DIAGNOSIS — M5126 Other intervertebral disc displacement, lumbar region: Secondary | ICD-10-CM

## 2024-08-16 DIAGNOSIS — F3177 Bipolar disorder, in partial remission, most recent episode mixed: Secondary | ICD-10-CM

## 2024-08-16 DIAGNOSIS — F1094 Alcohol use, unspecified with alcohol-induced mood disorder: Secondary | ICD-10-CM

## 2024-08-16 DIAGNOSIS — Z6835 Body mass index (BMI) 35.0-35.9, adult: Secondary | ICD-10-CM

## 2024-08-16 DIAGNOSIS — Z0001 Encounter for general adult medical examination with abnormal findings: Secondary | ICD-10-CM | POA: Diagnosis not present

## 2024-08-16 DIAGNOSIS — Z0000 Encounter for general adult medical examination without abnormal findings: Secondary | ICD-10-CM

## 2024-08-16 DIAGNOSIS — K2100 Gastro-esophageal reflux disease with esophagitis, without bleeding: Secondary | ICD-10-CM

## 2024-08-16 DIAGNOSIS — E039 Hypothyroidism, unspecified: Secondary | ICD-10-CM

## 2024-08-16 MED ORDER — GABAPENTIN 300 MG PO CAPS: 300.0000 mg | ORAL_CAPSULE | Freq: Three times a day (TID) | ORAL | 3 refills | Status: AC

## 2024-08-16 MED ORDER — AMPHETAMINE-DEXTROAMPHETAMINE 20 MG PO TABS: 20.0000 mg | ORAL_TABLET | Freq: Every day | ORAL | 0 refills | Status: DC

## 2024-08-16 MED ORDER — LEVOTHYROXINE SODIUM 75 MCG PO TABS: 75.0000 ug | ORAL_TABLET | Freq: Every day | ORAL | 3 refills | Status: AC

## 2024-08-16 NOTE — Progress Notes (Signed)
 [name] is a 38 y.o. male presents to office today for annual physical exam examination.    He reports that he is doing fine now.  He is abstain from alcohol  since his hospitalization.  He notes that he is sleeping better.  About 1 week prior to hospitalization for alcohol -induced mood disorder, his wife separated from him along with his daughter.  He and his son are residing at the house still.  He is working towards total sobriety and improvement in mental health so that he can reconcile with his wife.  He has been increasing hydration.  He is tolerating the Abilify without difficulty and is actually now on 5 mg.  His Prozac  was discontinued and so far that seems to be going okay.  He continues Lamictal  and was also placed on naltrexone to decrease cravings of alcohol .  He is doing okay from a work standpoint.  He reports no concerns today.  Continues to see psychiatry 1 time per month and group therapy 1 time per week.  They were okay with him continuing Adderall  but could not prescribe it because [facility] is a no controlled substances facility   Occupation: driver, Marital status: married, Substance use: former ETOH use but in early remission Health Maintenance Due  Topic Date Due   COVID-19 Vaccine (1) Never done   Hepatitis B Vaccines 19-59 Average Risk (3 of 3 - 3-dose series) 04/04/1999   Hepatitis C Screening  Never done   HPV VACCINES (1 - 3-dose SCDM series) Never done   Influenza Vaccine  06/25/2024    Immunization History  Administered Date(s) Administered   DTaP 02/14/1986, 04/25/1986, 08/15/1986, 09/05/1987, 02/09/1991   HIB (PRP-OMP) 01/23/1988   Hepatitis B 09/29/1998, 02/07/1999   IPV 02/14/1986, 04/25/1986, 09/05/1987, 02/09/1991   Influenza Split 08/25/2010, 08/20/2012, 08/25/2014   MMR 02/28/1987, 02/09/1991   Past Medical History:  Diagnosis Date   Abscess    Right forearm healing   ADHD (attention deficit hyperactivity disorder)    Anal fissure     Anxiety    Arthritis    back, shoulders, ankles, knees   Back pain    Bipolar disorder (HCC)    Blood in stool 04/06/2009   Centricity Description: HEMATOCHEZIA Qualifier: Diagnosis of  By: Ezzard [name]  Centricity Description: MELENA Qualifier: Diagnosis of  By: Ezzard [name]    Chest pain    Chronic lower back pain    Class 3 severe obesity with serious comorbidity and body mass index (BMI) of 50.0 to 59.9 in adult 05/03/2012   Constipation    Depression    Esophagitis    Distal esophageal erosions consistent with mild erosive  reflux esophagitis 12/2008 by EGD    External hemorrhoids    Gastric polyps    Gastric ulcer    GERD (gastroesophageal reflux disease)    Hepatic steatosis    Hiatal hernia    History of stomach ulcers    Hx MRSA infection 06/2007   right thigh   Hx of colonic polyps    Hypercholesteremia    denies   Hypothyroidism    Internal hemorrhoids    Irritable bowel syndrome (IBS)    Lower abdominal pain 03/07/2009   Qualifier: Diagnosis of  By: Georgina Palma     Obesity    Occult GI bleeding 12/2008   Trivial upper GI bleed/uncontrolled GERD by upper endoscopy 12/2008, normal f/u endoscopy 03/2009 with SBCE at that time   OSA on CPAP  Pain management    Pneumonia 09/01/2015   on ATB still (09/04/2015)   S/P colonoscopy 11/10, 10/08   Dr Belvia   Schizophrenia [facility])    Scrotal pain 04/26/2022   Sleep apnea    Stomach ulcer    Syncope and collapse 05/03/2012   Thyroid  function test abnormal    Noted in 2011 discharge   Tobacco dipper    Wears contact lenses    Social History   Socioeconomic History   Marital status: Married    Spouse name: Tabitha   Number of children: 2   Years of education: Not on file   Highest education level: 12th grade  Occupational History   Occupation: Lobbyist: [employer]   Occupation: truck Hospital doctor  Tobacco Use   Smoking status: Never   Smokeless tobacco: Former     Types: Snuff    Quit date: 12/16/2020   Tobacco comments:    quitting snuff. whinning off   Vaping Use   Vaping status: Never Used  Substance and Sexual Activity   Alcohol  use: Not Currently   Drug use: No   Sexual activity: Not Currently  Other Topics Concern   Not on file  Social History Narrative   Not on file   Social Drivers of Health   Financial Resource Strain: Low Risk  (12/23/2023)   Overall Financial Resource Strain (CARDIA)    Difficulty of Paying Living Expenses: Not hard at all  Food Insecurity: No Food Insecurity (07/22/2024)   Received from [facility]   Hunger Vital Sign    Within the past 12 months, you worried that your food would run out before you got the money to buy more.: Never true    Within the past 12 months, the food you bought just didn't last and you didn't have money to get more.: Never true  Transportation Needs: No Transportation Needs (07/22/2024)   Received from [facility] - Transportation    In the past 12 months, has lack of transportation kept you from medical appointments or from getting medications?: No    In the past 12 months, has lack of transportation kept you from meetings, work, or from getting things needed for daily living?: No  Physical Activity: Sufficiently Active (12/23/2023)   Exercise Vital Sign    Days of Exercise per Week: 6 days    Minutes of Exercise per Session: 150+ min  Stress: Stress Concern Present (07/22/2024)   Received from [facility] - Occupational Stress Questionnaire    Do you feel stress - tense, restless, nervous, or anxious, or unable to sleep at night because your mind is troubled all the time - these days?: Rather much  Social Connections: Moderately Integrated (12/23/2023)   Social Connection and Isolation Panel    Frequency of Communication with Friends and Family: More than three times a week    Frequency of Social Gatherings with Friends and  Family: More than three times a week    Attends Religious Services: More than 4 times per year    Active Member of [employer] or Organizations: No    Attends Banker Meetings: Not on file    Marital Status: Married  Intimate Partner Violence: Not At Risk (07/22/2024)   Received from [facility]   HITS    Over the last 12 months how often did your partner physically hurt you?: Never    Over the last 12  months how often did your partner insult you or talk down to you?: Never    Over the last 12 months how often did your partner threaten you with physical harm?: Never    Over the last 12 months how often did your partner scream or curse at you?: Never   Past Surgical History:  Procedure Laterality Date   ANKLE FRACTURE SURGERY Left ~ 2008   BACK SURGERY     BIOPSY  11/02/2019   Procedure: BIOPSY;  Surgeon: San Sandor GAILS, DO;  Location: [facility];  Service: Gastroenterology;;  EGD and Colon   BIOPSY  06/07/2020   Procedure: BIOPSY;  Surgeon: San Sandor GAILS, DO;  Location: [facility];  Service: Gastroenterology;;   BRAVO PH STUDY N/A 06/07/2020   Procedure: BRAVO PH STUDY on PPI;  Surgeon: San Sandor GAILS, DO;  Location: [facility];  Service: Gastroenterology;  Laterality: N/A;   COLONOSCOPY  10/10/2009   anal papilla otherwise normal   COLONOSCOPY WITH PROPOFOL  N/A 11/02/2019   Procedure: COLONOSCOPY WITH PROPOFOL ;  Surgeon: San Sandor GAILS, DO;  Location: [facility];  Service: Gastroenterology;  Laterality: N/A;   ESOPHAGOGASTRODUODENOSCOPY  04/07/2009   Normal esophagus, small hiatal hernia   ESOPHAGOGASTRODUODENOSCOPY  01/09/2009   Distal esophageal erosions consistent with mild erosive reflux esophagitis, otherwise normal esophagus, small hiatal herniaotherwise normal stomach, D1-D2    ESOPHAGOGASTRODUODENOSCOPY  08/26/2007   Normal esophagus, a small hiatal/hernia, otherwise normal stomach D1 through D3   ESOPHAGOGASTRODUODENOSCOPY (EGD) WITH  PROPOFOL  N/A 11/02/2019   Procedure: ESOPHAGOGASTRODUODENOSCOPY (EGD) WITH PROPOFOL ;  Surgeon: San Sandor GAILS, DO;  Location: [facility];  Service: Gastroenterology;  Laterality: N/A;   ESOPHAGOGASTRODUODENOSCOPY (EGD) WITH PROPOFOL  N/A 06/07/2020   Procedure: ESOPHAGOGASTRODUODENOSCOPY (EGD) WITH PROPOFOL ;  Surgeon: San Sandor GAILS, DO;  Location: [facility];  Service: Gastroenterology;  Laterality: N/A;   FRACTURE SURGERY     GASTRIC BYPASS     GASTRIC ROUX-EN-Y N/A 06/05/2021   Procedure: LAPAROSCOPIC ROUX-EN-Y GASTRIC BYPASS WITH UPPER ENDOSCOPY;  Surgeon: Signe Mitzie LABOR, MD;  Location: [facility];  Service: General;  Laterality: N/A;   ileocolonoscopy  08/26/2007    A normal rectum, colon, and terminal ileum   INCISION AND DRAINAGE  09/04/2015   reopened my back incsion   LUMBAR LAMINECTOMY/DECOMPRESSION MICRODISCECTOMY Left 09/23/2017   Procedure: Microdiscectomy - left - Lumbar four-Lumbar five;  Surgeon: Louis Shove, MD;  [facility];  Service: Neurosurgery;  Laterality: Left;   LUMBAR MICRODISCECTOMY  08/21/2015   LUMBAR MICRODISCECTOMY Left 09/23/2017   L4-5   LUMBAR WOUND DEBRIDEMENT N/A 09/04/2015   Procedure: LUMBAR WOUND DEBRIDEMENT;  Surgeon: Gerldine Maizes, MD;  Location: [facility];  Service: Neurosurgery;  Laterality: N/A;   right side sugery     enlarged lymph node gland removed under arm pit age 56-5 years   SHOULDER ARTHROSCOPY WITH DISTAL CLAVICLE RESECTION Right 11/22/2021   Procedure: SHOULDER ARTHROSCOPY WITH SUBACROMIAL DECOMPRESSION AND DISTAL CLAVICLE EXCISION;  Surgeon: Onesimo Oneil LABOR, MD;  Location: [facility];  Service: Orthopedics;  Laterality: Right;   Small bowel capsule  04/11/2009    normal throughout   VASECTOMY     Family History  Problem Relation Age of Onset   Leukemia Father 17   Colon polyps Father    Clotting disorder Father    Heart disease Father    Cancer Father    Depression Father    Sleep apnea Father    Obesity  Father    Seizures Mother    Irritable bowel  syndrome Mother    Thyroid  disease Mother    Depression Mother    Anxiety disorder Mother    Bipolar disorder Brother    ADD / ADHD Brother    Diabetes Paternal Grandmother    Ulcerative colitis Paternal Aunt    Colon cancer Neg Hx    Liver cancer Neg Hx    Esophageal cancer Neg Hx    Stomach cancer Neg Hx    Pancreatic cancer Neg Hx     Current Outpatient Medications:    amphetamine -dextroamphetamine  (ADDERALL ) 20 MG tablet, Take 1 tablet (20 mg total) by mouth daily., Disp: 30 tablet, Rfl: 0   amphetamine -dextroamphetamine  (ADDERALL ) 20 MG tablet, Take 1 tablet (20 mg total) by mouth daily., Disp: 30 tablet, Rfl: 0   [START ON 09/15/2024] amphetamine -dextroamphetamine  (ADDERALL ) 20 MG tablet, Take 1 tablet (20 mg total) by mouth daily., Disp: 30 tablet, Rfl: 0   azelastine  (ASTELIN ) 0.1 % nasal spray, Place 1 spray into both nostrils 2 (two) times daily. Use in each nostril as directed, Disp: 30 mL, Rfl: 0   Calcium  Carbonate (CALCIUM  600 PO), Take 600 mg by mouth in the morning, at noon, and at bedtime., Disp: , Rfl:    cyclobenzaprine  (FLEXERIL ) 10 MG tablet, Take 1 tablet (10 mg total) by mouth 3 (three) times daily as needed. for muscle spams, Disp: 90 tablet, Rfl: 6   desloratadine  (CLARINEX ) 5 MG tablet, Take 1 tablet (5 mg total) by mouth daily. For allergies, Disp: 90 tablet, Rfl: 3   docusate sodium  (COLACE) 250 MG capsule, Take 250 mg by mouth daily., Disp: , Rfl:    EPINEPHrine  0.3 mg/0.3 mL IJ SOAJ injection, Inject 0.3 mg into the muscle as needed for anaphylaxis., Disp: 1 each, Rfl: 0   FLUoxetine  (PROZAC ) 10 MG capsule, Take 1 capsule (10 mg total) by mouth daily., Disp: 90 capsule, Rfl: 3   fluticasone  (FLONASE ) 50 MCG/ACT nasal spray, Place 2 sprays into both nostrils daily., Disp: 16 g, Rfl: 6   gabapentin  (NEURONTIN ) 300 MG capsule, Take 1 capsule (300 mg total) by mouth 3 (three) times daily., Disp: 270 capsule, Rfl: 3    ketoconazole  (NIZORAL ) 2 % cream, Apply 1 Application topically daily. X10-14 days per flare up of fungal rash, Disp: 60 g, Rfl: 1   lamoTRIgine  (LAMICTAL ) 100 MG tablet, TAKE 1 TABLET BY MOUTH TWICE DAILY. NOTE DOSE CHANGE, Disp: 60 tablet, Rfl: 0   levothyroxine  (SYNTHROID ) 75 MCG tablet, Take 1 tablet (75 mcg total) by mouth daily before breakfast., Disp: 90 tablet, Rfl: 3   lidocaine  (LIDODERM ) 5 %, Place 1 patch onto the skin daily. Remove & Discard patch within 12 hours or as directed by MD, Disp: 30 patch, Rfl: 0   Multiple Vitamins-Minerals (BARIATRIC MULTIVITAMINS/IRON PO), Take 1 tablet by mouth in the morning and at bedtime., Disp: , Rfl:    ondansetron  (ZOFRAN -ODT) 4 MG disintegrating tablet, Take 1 tablet (4 mg total) by mouth every 8 (eight) hours as needed for nausea or vomiting., Disp: 20 tablet, Rfl: 0   pantoprazole  (PROTONIX ) 40 MG tablet, TAKE 1 TABLET BY MOUTH ONCE DAILY AS NEEDED FOR  HEARTBURN  OR  INDIGESTION, Disp: 90 tablet, Rfl: 0   Rimegepant Sulfate (NURTEC) 75 MG TBDP, Take 1 tablet (75 mg total) by mouth daily as needed (migraine)., Disp: , Rfl:    sucralfate  (CARAFATE ) 1 g tablet, Take 1 tablet (1 g total) by mouth 4 (four) times daily -  with meals and at bedtime., Disp: 90  tablet, Rfl: 0   verapamil  (VERELAN ) 100 MG 24 hr capsule, Take 1 capsule (100 mg total) by mouth at bedtime., Disp: 30 capsule, Rfl: 2   Vitamin D , Ergocalciferol , (DRISDOL ) 1.25 MG (50000 UNIT) CAPS capsule, TAKE 1 CAPSULE BY MOUTH EVERY THURSDAY, Disp: 12 capsule, Rfl: 1  Allergies  Allergen Reactions   Ibuprofen  Other (See Comments)    Makes ulcers bleed   Influenza Vaccines Shortness Of Breath and Other (See Comments)    Rash and unable to breathe well   Tylenol  [Acetaminophen ] Hives   Bee Venom Swelling    SWELLING REACTION UNSPECIFIED    Tramadol  Itching     ROS: Review of Systems Pertinent items noted in HPI and remainder of comprehensive ROS otherwise negative.    Physical  exam BP 115/74   Pulse 92   Temp 97.9 F (36.6 C)   Ht 5' 11 (1.803 m)   Wt 277 lb 2 oz (125.7 kg)   SpO2 97%   BMI 38.65 kg/m  General appearance: alert, cooperative, appears stated age, no distress, and moderately obese Head: Normocephalic, without obvious abnormality, atraumatic Eyes: negative findings: lids and lashes normal, conjunctivae and sclerae normal, corneas clear, and pupils equal, round, reactive to light and accomodation Ears: normal TM's and external ear canals both ears Nose: Nares normal. Septum midline. Mucosa normal. No drainage or sinus tenderness. Throat: lips, mucosa, and tongue normal; teeth and gums normal Neck: no adenopathy, no carotid bruit, supple, symmetrical, trachea midline, and thyroid  not enlarged, symmetric, no tenderness/mass/nodules Back: symmetric, no curvature. ROM normal. No CVA tenderness. Lungs: clear to auscultation bilaterally Chest wall: no tenderness Heart: regular rate and rhythm, S1, S2 normal, no murmur, click, rub or gallop Abdomen: soft, non-tender; bowel sounds normal; no masses,  no organomegaly Extremities: extremities normal, atraumatic, no cyanosis or edema Pulses: 2+ and symmetric Skin: Skin color, texture, turgor normal. No rashes or lesions Lymph nodes: Cervical, supraclavicular, and axillary nodes normal. Neurologic: Grossly normal      08/16/2024    4:05 PM 12/23/2023    3:22 PM 06/25/2023    8:53 AM  Depression screen PHQ 2/9  Decreased Interest 0 0 0  Down, Depressed, Hopeless 1 0 1  PHQ - 2 Score 1 0 1  Altered sleeping 0 0 0  Tired, decreased energy 1 0 0  Change in appetite 0 0 0  Feeling bad or failure about yourself  0 0 0  Trouble concentrating 0 0 0  Moving slowly or fidgety/restless 0 0 0  Suicidal thoughts 0 0 0  PHQ-9 Score 2 0 1  Difficult doing work/chores Somewhat difficult Not difficult at all Not difficult at all      08/16/2024    4:05 PM 12/23/2023    3:22 PM 06/25/2023    8:53 AM 03/25/2023     9:01 AM  GAD 7 : Generalized Anxiety Score  Nervous, Anxious, on Edge 2 0 1 0  Control/stop worrying 0 0 0 0  Worry too much - different things 0 0 1 0  Trouble relaxing 1 0 0 0  Restless 1 0 0 0  Easily annoyed or irritable 1 0 0 0  Afraid - awful might happen 0 0 0 0  Total GAD 7 Score 5 0 2 0  Anxiety Difficulty Somewhat difficult Not difficult at all Not difficult at all     Recent Results (from the past 2160 hours)  Basic metabolic panel     Status: Abnormal   Collection  Time: 06/19/24  6:26 PM  Result Value Ref Range   Sodium 139 135 - 145 mmol/L   Potassium 4.1 3.5 - 5.1 mmol/L   Chloride 102 98 - 111 mmol/L   CO2 23 22 - 32 mmol/L   Glucose, Bld 171 (H) 70 - 99 mg/dL    Comment: Glucose reference range applies only to samples taken after fasting for at least 8 hours.   BUN 10 6 - 20 mg/dL   Creatinine, Ser 8.96 0.61 - 1.24 mg/dL   Calcium  9.5 8.9 - 10.3 mg/dL   GFR, Estimated >39 >39 mL/min    Comment: (NOTE) Calculated using the CKD-EPI Creatinine Equation (2021)    Anion gap 14 5 - 15    Comment: Performed at [facility], [address], [place], [place] [address]  CBC     Status: None   Collection Time: 06/19/24  6:26 PM  Result Value Ref Range   WBC 7.6 4.0 - 10.5 K/uL   RBC 4.98 4.22 - 5.81 MIL/uL   Hemoglobin 15.6 13.0 - 17.0 g/dL   HCT 54.2 60.9 - 47.9 %   MCV 91.8 80.0 - 100.0 fL   MCH 31.3 26.0 - 34.0 pg   MCHC 34.1 30.0 - 36.0 g/dL   RDW 86.6 88.4 - 84.4 %   Platelets 228 150 - 400 K/uL   nRBC 0.0 0.0 - 0.2 %    Comment: Performed at [facility], [address], [place], [place] [address]  Troponin T, High Sensitivity     Status: None   Collection Time: 06/19/24  6:26 PM  Result Value Ref Range   Troponin T High Sensitivity <15 <19 ng/L    Comment: (NOTE) Biotin concentrations > 1000 ng/mL falsely decrease TnT results.  Serial cardiac troponin measurements are suggested.  Refer to the Links section  for chest pain algorithms and additional  guidance. Performed at [facility], [address], [place], [place] [address]   Troponin T, High Sensitivity     Status: None   Collection Time: 06/19/24  8:29 PM  Result Value Ref Range   Troponin T High Sensitivity <15 <19 ng/L    Comment: (NOTE) Biotin concentrations > 1000 ng/mL falsely decrease TnT results.  Serial cardiac troponin measurements are suggested.  Refer to the Links section for chest pain algorithms and additional  guidance. Performed at [facility], [address], [place], [place] [address]      Assessment/ Plan: JARMON JAVID here for annual physical exam.   Annual physical exam  Bipolar disorder, in partial remission, most recent episode mixed (HCC)  Alcohol -induced mood disorder (HCC)  Attention deficit hyperactivity disorder (ADHD), combined type - Plan: amphetamine -dextroamphetamine  (ADDERALL ) 20 MG tablet, amphetamine -dextroamphetamine  (ADDERALL ) 20 MG tablet, amphetamine -dextroamphetamine  (ADDERALL ) 20 MG tablet  Acquired hypothyroidism - Plan: levothyroxine  (SYNTHROID ) 75 MCG tablet  Severe obesity (BMI 35.0-35.9 with comorbidity) (HCC)  Gastroesophageal reflux disease with esophagitis without hemorrhage  Lumbar disc herniation - Plan: gabapentin  (NEURONTIN ) 300 MG capsule   Decline influenza vaccine because he is allergic to influenza shot  I reviewed his labs that were completed at his visit in the hospital.  No further testing performed today.  He is up-to-date on UDS and CSC and the national narcotic database was reviewed.  Will continue the Adderall  for now as long as we are closely following up with his psychiatrist and I asked that they continue to send me information.  I anticipate will need EKG and repeat  labs in December  He will continue all medications as prescribed.  Refills have been sent  GERD is stable off of PPI  Counseled on healthy  lifestyle choices, including diet (rich in fruits, vegetables and lean meats and low in salt and simple carbohydrates) and exercise (at least 30 minutes of moderate physical activity daily).  Patient to follow up 58m  Lillith Mcneff M. Jolinda, DO

## 2024-11-23 ENCOUNTER — Ambulatory Visit: Payer: Self-pay | Admitting: Family Medicine

## 2024-12-14 ENCOUNTER — Encounter: Payer: Self-pay | Admitting: Family Medicine

## 2024-12-14 ENCOUNTER — Ambulatory Visit: Admitting: Family Medicine

## 2024-12-14 VITALS — BP 119/74 | HR 98 | Temp 97.6°F | Ht 71.0 in | Wt 300.2 lb

## 2024-12-14 DIAGNOSIS — E039 Hypothyroidism, unspecified: Secondary | ICD-10-CM

## 2024-12-14 DIAGNOSIS — G4733 Obstructive sleep apnea (adult) (pediatric): Secondary | ICD-10-CM | POA: Diagnosis not present

## 2024-12-14 DIAGNOSIS — F3177 Bipolar disorder, in partial remission, most recent episode mixed: Secondary | ICD-10-CM | POA: Diagnosis not present

## 2024-12-14 DIAGNOSIS — F1094 Alcohol use, unspecified with alcohol-induced mood disorder: Secondary | ICD-10-CM | POA: Diagnosis not present

## 2024-12-14 DIAGNOSIS — F902 Attention-deficit hyperactivity disorder, combined type: Secondary | ICD-10-CM | POA: Diagnosis not present

## 2024-12-14 MED ORDER — ARIPIPRAZOLE 5 MG PO TABS: 5.0000 mg | ORAL_TABLET | Freq: Every day | ORAL | 1 refills | Status: AC

## 2024-12-14 MED ORDER — AMPHETAMINE-DEXTROAMPHETAMINE 20 MG PO TABS: 20.0000 mg | ORAL_TABLET | Freq: Every day | ORAL | 0 refills | Status: AC

## 2024-12-14 MED ORDER — LAMOTRIGINE 100 MG PO TABS: 100.0000 mg | ORAL_TABLET | Freq: Every day | ORAL | 3 refills | Status: AC

## 2024-12-14 MED ORDER — EPINEPHRINE 0.3 MG/0.3ML IJ SOAJ: 0.3000 mg | INTRAMUSCULAR | 0 refills | Status: AC | PRN

## 2024-12-14 NOTE — Progress Notes (Signed)
 "  Subjective: CC: Follow-up ADHD, mood PCP: Phillip Norene HERO, DO Gardner:[name] is a 39 y.o. male presenting to clinic today for:  ADHD associate with mood disorder, alcohol -induced Patient reports that he stopped seeing [facility] due to cost of appointments.  He reports stability of mental health with Abilify , Lamictal  and stability of ADHD with Adderall .  He does report some weight gain since being on the Abilify  and this has brought him some concern.  He denies any heart palpitations, presyncopal episodes, change in exercise tolerance etc.  He has abstained from alcohol  use and has reconciled with his spouse.  ROS: Per HPI  Allergies[1] Past Medical History:  Diagnosis Date   Abscess    Right forearm healing   ADHD (attention deficit hyperactivity disorder)    Anal fissure    Anxiety    Arthritis    back, shoulders, ankles, knees   Back pain    Bipolar disorder (HCC)    Blood in stool 04/06/2009   Centricity Description: HEMATOCHEZIA Qualifier: Diagnosis of  By: [name]  Centricity Description: MELENA Qualifier: Diagnosis of  By: [name]    Chest pain    Chronic lower back pain    Class 3 severe obesity with serious comorbidity and body mass index (BMI) of 50.0 to 59.9 in adult (HCC) 05/03/2012   Constipation    Depression    Esophagitis    Distal esophageal erosions consistent with mild erosive  reflux esophagitis 12/2008 by EGD    External hemorrhoids    Gastric polyps    Gastric ulcer    GERD (gastroesophageal reflux disease)    Hepatic steatosis    Hiatal hernia    History of stomach ulcers    Hx MRSA infection 06/2007   right thigh   Hx of colonic polyps    Hypercholesteremia    denies   Hypothyroidism    Internal hemorrhoids    Irritable bowel syndrome (IBS)    Lower abdominal pain 03/07/2009   Qualifier: Diagnosis of  By: [name]     Obesity    Occult GI bleeding 12/2008   Trivial upper GI bleed/uncontrolled GERD  by upper endoscopy 12/2008, normal f/u endoscopy 03/2009 with [facility] at that time   OSA on CPAP    Pain management    Pneumonia 09/01/2015   on ATB still (09/04/2015)   S/P colonoscopy 11/10, 10/08   Dr Belvia   Schizophrenia [facility])    Scrotal pain 04/26/2022   Sleep apnea    Stomach ulcer    Syncope and collapse 05/03/2012   Thyroid  function test abnormal    Noted in 2011 discharge   Tobacco dipper    Wears contact lenses    Current Medications[2] Social History   Socioeconomic History   Marital status: Married    Spouse name: [name]   Number of children: 2   Years of education: Not on file   Highest education level: 12th grade  Occupational History   Occupation: Lobbyist: [employer]   Occupation: truck hospital doctor  Tobacco Use   Smoking status: Never   Smokeless tobacco: Former    Types: Snuff    Quit date: 12/16/2020   Tobacco comments:    quitting snuff. whinning off   Vaping Use   Vaping status: Never Used  Substance and Sexual Activity   Alcohol  use: Not Currently   Drug use: No   Sexual activity: Not Currently  Other Topics Concern  Not on file  Social History Narrative   Not on file   Social Drivers of Health   Tobacco Use: High Risk (11/08/2024)   Received [facility]   Patient History    Smoking Tobacco Use: Never    Smokeless Tobacco Use: Current    Passive Exposure: Never  Financial Resource Strain: Low Risk (12/13/2024)   Overall Financial Resource Strain (CARDIA)    Difficulty of Paying Living Expenses: Not hard at all  Food Insecurity: No Food Insecurity (12/13/2024)   Epic    Worried About Programme Researcher, Broadcasting/film/video in the Last Year: Never true    Ran Out of Food in the Last Year: Never true  Transportation Needs: No Transportation Needs (12/13/2024)   Epic    Lack of Transportation (Medical): No    Lack of Transportation (Non-Medical): No  Physical Activity: Sufficiently Active (12/13/2024)   Exercise Vital Sign     Days of Exercise per Week: 6 days    Minutes of Exercise per Session: 150+ min  Stress: No Stress Concern Present (12/13/2024)   [employer] of Occupational Health - Occupational Stress Questionnaire    Feeling of Stress: Only a little  Social Connections: Socially Integrated (12/13/2024)   Social Connection and Isolation Panel    Frequency of Communication with Friends and Family: More than three times a week    Frequency of Social Gatherings with Friends and Family: More than three times a week    Attends Religious Services: More than 4 times per year    Active Member of [employer] or Organizations: Yes    Attends Engineer, Structural: More than 4 times per year    Marital Status: Married  Catering Manager Violence: Not At Risk (07/22/2024)   Received from [facility]   HITS    Over the last 12 months how often did your partner physically hurt you?: Never    Over the last 12 months how often did your partner insult you or talk down to you?: Never    Over the last 12 months how often did your partner threaten you with physical harm?: Never    Over the last 12 months how often did your partner scream or curse at you?: Never  Depression (PHQ2-9): Low Risk (08/16/2024)   Depression (PHQ2-9)    PHQ-2 Score: 2  Alcohol  Screen: Low Risk (12/23/2023)   Alcohol  Screen    Last Alcohol  Screening Score (AUDIT): 4  Housing: Unknown (12/13/2024)   Epic    Unable to Pay for Housing in the Last Year: No    Number of Times Moved in the Last Year: Not on file    Homeless in the Last Year: No  Utilities: Low Risk (11/08/2024)   Received from [facility]   Utilities    Within the past 12 months, have you been unable to get utilities(heat, electricity) when it was really needed?: No  Health Literacy: Not on file   Family History  Problem Relation Age of Onset   Leukemia Father 46   Colon polyps Father    Clotting disorder Father    Heart disease Father    Cancer Father     Depression Father    Sleep apnea Father    Obesity Father    Seizures Mother    Irritable bowel syndrome Mother    Thyroid  disease Mother    Depression Mother    Anxiety disorder Mother    Bipolar disorder Brother    ADD /  ADHD Brother    Diabetes Paternal Grandmother    Ulcerative colitis Paternal Aunt    Colon cancer Neg Hx    Liver cancer Neg Hx    Esophageal cancer Neg Hx    Stomach cancer Neg Hx    Pancreatic cancer Neg Hx     Objective: Office vital signs reviewed. BP 119/74   Pulse 98   Temp 97.6 F (36.4 C)   Ht 5' 11 (1.803 m)   Wt (!) 300 lb 4 oz (136.2 kg)   SpO2 99%   BMI 41.88 kg/m   Physical Examination:  General: Awake, alert, morbidly obese, No acute distress HEENT: Sclera white.  Moist mucous membranes Cardio: regular rate and rhythm, S1S2 heard, no murmurs appreciated Pulm: clear to auscultation bilaterally, no wheezes, rhonchi or rales; normal work of breathing on room air Neuro: No tremor Psych: Mood stable, speech normal, affect appropriate     12/14/2024    3:29 PM 08/16/2024    4:05 PM 12/23/2023    3:22 PM  Depression screen PHQ 2/9  Decreased Interest 0 0 0  Down, Depressed, Hopeless 1 1 0  PHQ - 2 Score 1 1 0  Altered sleeping 0 0 0  Tired, decreased energy 1 1 0  Change in appetite 0 0 0  Feeling bad or failure about yourself  0 0 0  Trouble concentrating 0 0 0  Moving slowly or fidgety/restless 0 0 0  Suicidal thoughts 0 0 0  PHQ-9 Score 2 2  0   Difficult doing work/chores Not difficult at all Somewhat difficult Not difficult at all     Data saved with a previous flowsheet row definition      12/14/2024    3:29 PM 08/16/2024    4:05 PM 12/23/2023    3:22 PM 06/25/2023    8:53 AM  GAD 7 : Generalized Anxiety Score  Nervous, Anxious, on Edge 1 2  0  1   Control/stop worrying 0 0  0  0   Worry too much - different things 0 0  0  1   Trouble relaxing 0 1  0  0   Restless 1 1  0  0   Easily annoyed or irritable 0 1  0  0    Afraid - awful might happen 0 0  0  0   Total GAD 7 Score 2 5 0 2  Anxiety Difficulty Not difficult at all Somewhat difficult Not difficult at all Not difficult at all     Data saved with a previous flowsheet row definition    Assessment/ Plan: 39 y.o. male   Attention deficit hyperactivity disorder (ADHD), combined type - Plan: CMP14+EGFR, amphetamine -dextroamphetamine  (ADDERALL ) 20 MG tablet, amphetamine -dextroamphetamine  (ADDERALL ) 20 MG tablet, amphetamine -dextroamphetamine  (ADDERALL ) 20 MG tablet  Bipolar disorder, in partial remission, most recent episode mixed (HCC) - Plan: CMP14+EGFR, EKG 12-Lead, ARIPiprazole  (ABILIFY ) 5 MG tablet, lamoTRIgine  (LAMICTAL ) 100 MG tablet, Liraglutide  -Weight Management (SAXENDA ) 18 MG/3ML SOPN  Alcohol -induced mood disorder (HCC) - Plan: CMP14+EGFR, ARIPiprazole  (ABILIFY ) 5 MG tablet, Liraglutide  -Weight Management (SAXENDA ) 18 MG/3ML SOPN  Acquired hypothyroidism - Plan: TSH + free T4, CMP14+EGFR  Obesity, morbid, BMI 40.0-49.9 (HCC) - Plan: Liraglutide  -Weight Management (SAXENDA ) 18 MG/3ML SOPN  Obstructive sleep apnea - Plan: Liraglutide  -Weight Management (SAXENDA ) 18 MG/3ML SOPN   EKG obtained to look for any evidence of QTc prolongation etc. but no abnormalities appreciated.  Adderall  renewed.  UDS and CSA are up-to-date.  National narcotic database reviewed  with no red flags.  I will take over Abilify  as he is stable on this medication.  Check thyroid  levels.  Glad to start him on a GLP.  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  He has a preference for the new Wegovy orally but will check with insurance to see if that is covered.  Will plan to start it if so and he will follow-up in 3 months, sooner if concerns arise  **addedum Plan will cover Saxenda . Rx sent.  Patient's BMI is >30 mg/m2.  Patient's  current BMI is Body mass index is 41.88 kg/m.[name]  Patient is currently enrolled in a healthy eating plan along with encouraged exercise.  Patient has contraindications to phentermine, Contrave & Qsymia (contains phentermine).  Patient does not have a personal or family history of medullary thyroid  carcinoma (MTC) or Multiple Endocrine Neoplasia syndrome type 2 (MEN 2).   Norene CHRISTELLA Fielding, DO [facility] [phone]     [1]  Allergies Allergen Reactions   Ibuprofen  Other (See Comments)    Makes ulcers bleed   Influenza Vaccines Shortness Of Breath and Other (See Comments)    Rash and unable to breathe well   Tylenol  [Acetaminophen ] Hives   Bee Venom Swelling    SWELLING REACTION UNSPECIFIED    Tramadol  Itching  [2]  Current Outpatient Medications:    amphetamine -dextroamphetamine  (ADDERALL ) 20 MG tablet, Take 1 tablet (20 mg total) by mouth daily., Disp: 30 tablet, Rfl: 0   amphetamine -dextroamphetamine  (ADDERALL ) 20 MG tablet, Take 1 tablet (20 mg total) by mouth daily., Disp: 30 tablet, Rfl: 0   amphetamine -dextroamphetamine  (ADDERALL ) 20 MG tablet, Take 1 tablet (20 mg total) by mouth daily., Disp: 30 tablet, Rfl: 0   ARIPiprazole  (ABILIFY ) 5 MG tablet, Take 5 mg by mouth daily., Disp: , Rfl:    azelastine  (ASTELIN ) 0.1 % nasal spray, Place 1 spray into both nostrils 2 (two) times daily. Use in each nostril as directed, Disp: 30 mL, Rfl: 0   Calcium  Carbonate (CALCIUM  600 PO), Take 600 mg by mouth in the morning, at noon, and at bedtime., Disp: , Rfl:    cyclobenzaprine  (FLEXERIL ) 10 MG tablet, Take 1 tablet (10 mg total) by mouth 3 (three) times daily as needed. for muscle spams, Disp: 90 tablet, Rfl: 6   desloratadine  (CLARINEX ) 5 MG tablet, Take 1 tablet (5 mg total) by mouth daily. For allergies, Disp: 90 tablet, Rfl: 3   docusate sodium  (COLACE) 250 MG capsule, Take 250 mg by mouth daily., Disp: , Rfl:    EPINEPHrine  0.3 mg/0.3 mL IJ SOAJ injection,  Inject 0.3 mg into the muscle as needed for anaphylaxis., Disp: 1 each, Rfl: 0   fluticasone  (FLONASE ) 50 MCG/ACT nasal spray, Place 2 sprays into both nostrils daily., Disp: 16 g, Rfl: 6   gabapentin  (NEURONTIN ) 300 MG capsule, Take 1 capsule (300 mg total) by mouth 3 (three) times daily., Disp: 270 capsule, Rfl: 3   lamoTRIgine  (LAMICTAL ) 100 MG tablet, TAKE 1 TABLET BY MOUTH TWICE DAILY. NOTE DOSE CHANGE, Disp: 60 tablet, Rfl: 0   levothyroxine  (SYNTHROID ) 75 MCG tablet, Take 1 tablet (75 mcg total) by mouth daily before breakfast., Disp: 90 tablet, Rfl: 3   Multiple Vitamins-Minerals (BARIATRIC MULTIVITAMINS/IRON PO), Take 1 tablet by mouth in the morning and at bedtime., Disp: , Rfl:    ondansetron  (ZOFRAN -ODT) 4  MG disintegrating tablet, Take 1 tablet (4 mg total) by mouth every 8 (eight) hours as needed for nausea or vomiting., Disp: 20 tablet, Rfl: 0   Rimegepant Sulfate (NURTEC) 75 MG TBDP, Take 1 tablet (75 mg total) by mouth daily as needed (migraine)., Disp: , Rfl:    verapamil  (VERELAN ) 100 MG 24 hr capsule, Take 1 capsule (100 mg total) by mouth at bedtime., Disp: 30 capsule, Rfl: 2  "

## 2024-12-15 ENCOUNTER — Encounter: Payer: Self-pay | Admitting: Family Medicine

## 2024-12-15 ENCOUNTER — Ambulatory Visit: Payer: Self-pay | Admitting: Family Medicine

## 2024-12-15 LAB — CMP14+EGFR
ALT: 20 IU/L (ref 0–44)
AST: 19 IU/L (ref 0–40)
Albumin: 4.3 g/dL (ref 4.1–5.1)
Alkaline Phosphatase: 79 IU/L (ref 47–123)
BUN/Creatinine Ratio: 11 (ref 9–20)
BUN: 11 mg/dL (ref 6–20)
Bilirubin Total: 0.4 mg/dL (ref 0.0–1.2)
CO2: 20 mmol/L (ref 20–29)
Calcium: 9.1 mg/dL (ref 8.7–10.2)
Chloride: 105 mmol/L (ref 96–106)
Creatinine, Ser: 0.96 mg/dL (ref 0.76–1.27)
Globulin, Total: 1.9 g/dL (ref 1.5–4.5)
Glucose: 116 mg/dL — ABNORMAL HIGH (ref 70–99)
Potassium: 4 mmol/L (ref 3.5–5.2)
Sodium: 142 mmol/L (ref 134–144)
Total Protein: 6.2 g/dL (ref 6.0–8.5)
eGFR: 103 mL/min/1.73

## 2024-12-15 LAB — TSH+FREE T4
Free T4: 1.02 ng/dL (ref 0.82–1.77)
TSH: 2.58 u[IU]/mL (ref 0.450–4.500)

## 2024-12-15 MED ORDER — LIRAGLUTIDE -WEIGHT MANAGEMENT 18 MG/3ML ~~LOC~~ SOPN: PEN_INJECTOR | SUBCUTANEOUS | 3 refills | Status: AC

## 2024-12-15 NOTE — Addendum Note (Signed)
 Addended by: JOLINDA NORENE HERO on: 12/15/2024 01:20 PM   Modules accepted: Orders

## 2024-12-31 ENCOUNTER — Encounter (HOSPITAL_COMMUNITY): Payer: Self-pay | Admitting: *Deleted

## 2025-03-14 ENCOUNTER — Ambulatory Visit: Admitting: Family Medicine

## 2025-08-19 ENCOUNTER — Encounter: Payer: Self-pay | Admitting: Family Medicine
